# Patient Record
Sex: Female | Born: 1955 | Race: White | Hispanic: No | Smoking: Current every day smoker
Health system: Southern US, Community
[De-identification: ages and names within clinical notes are randomized; demographics above are authoritative.]

## PROBLEM LIST (undated history)

## (undated) ENCOUNTER — Emergency Department (HOSPITAL_COMMUNITY): Discharge status: Absent without leave | Payer: Medicare Other

## (undated) DIAGNOSIS — I10 Essential (primary) hypertension: Secondary | ICD-10-CM

## (undated) DIAGNOSIS — C3490 Malignant neoplasm of unspecified part of unspecified bronchus or lung: Secondary | ICD-10-CM

## (undated) DIAGNOSIS — M549 Dorsalgia, unspecified: Secondary | ICD-10-CM

## (undated) DIAGNOSIS — M5137 Other intervertebral disc degeneration, lumbosacral region: Secondary | ICD-10-CM

## (undated) DIAGNOSIS — R296 Repeated falls: Secondary | ICD-10-CM

## (undated) DIAGNOSIS — F329 Major depressive disorder, single episode, unspecified: Secondary | ICD-10-CM

## (undated) DIAGNOSIS — E271 Primary adrenocortical insufficiency: Secondary | ICD-10-CM

## (undated) DIAGNOSIS — K219 Gastro-esophageal reflux disease without esophagitis: Secondary | ICD-10-CM

## (undated) DIAGNOSIS — F32A Depression, unspecified: Secondary | ICD-10-CM

## (undated) DIAGNOSIS — C439 Malignant melanoma of skin, unspecified: Secondary | ICD-10-CM

## (undated) DIAGNOSIS — E063 Autoimmune thyroiditis: Secondary | ICD-10-CM

## (undated) DIAGNOSIS — E039 Hypothyroidism, unspecified: Secondary | ICD-10-CM

## (undated) DIAGNOSIS — G8929 Other chronic pain: Secondary | ICD-10-CM

## (undated) DIAGNOSIS — M48061 Spinal stenosis, lumbar region without neurogenic claudication: Secondary | ICD-10-CM

## (undated) DIAGNOSIS — E785 Hyperlipidemia, unspecified: Secondary | ICD-10-CM

## (undated) DIAGNOSIS — I6529 Occlusion and stenosis of unspecified carotid artery: Secondary | ICD-10-CM

## (undated) DIAGNOSIS — M51379 Other intervertebral disc degeneration, lumbosacral region without mention of lumbar back pain or lower extremity pain: Secondary | ICD-10-CM

## (undated) DIAGNOSIS — J449 Chronic obstructive pulmonary disease, unspecified: Secondary | ICD-10-CM

## (undated) DIAGNOSIS — Z8701 Personal history of pneumonia (recurrent): Secondary | ICD-10-CM

## (undated) DIAGNOSIS — F319 Bipolar disorder, unspecified: Secondary | ICD-10-CM

## (undated) DIAGNOSIS — K220 Achalasia of cardia: Secondary | ICD-10-CM

## (undated) DIAGNOSIS — F419 Anxiety disorder, unspecified: Secondary | ICD-10-CM

## (undated) DIAGNOSIS — E118 Type 2 diabetes mellitus with unspecified complications: Secondary | ICD-10-CM

## (undated) DIAGNOSIS — M792 Neuralgia and neuritis, unspecified: Secondary | ICD-10-CM

## (undated) DIAGNOSIS — G459 Transient cerebral ischemic attack, unspecified: Secondary | ICD-10-CM

## (undated) HISTORY — PX: BREAST SURGERY: SHX581

## (undated) HISTORY — PX: HIATAL HERNIA REPAIR: SHX195

## (undated) HISTORY — PX: STOMACH SURGERY: SHX791

## (undated) HISTORY — PX: BACK SURGERY: SHX140

## (undated) HISTORY — DX: Autoimmune thyroiditis: E06.3

## (undated) HISTORY — DX: Chronic obstructive pulmonary disease, unspecified: J44.9

## (undated) HISTORY — DX: Depression, unspecified: F32.A

## (undated) HISTORY — DX: Other intervertebral disc degeneration, lumbosacral region: M51.37

## (undated) HISTORY — PX: TONSILLECTOMY: SUR1361

## (undated) HISTORY — DX: Malignant melanoma of skin, unspecified: C43.9

## (undated) HISTORY — DX: Other intervertebral disc degeneration, lumbosacral region without mention of lumbar back pain or lower extremity pain: M51.379

## (undated) HISTORY — DX: Hyperlipidemia, unspecified: E78.5

## (undated) HISTORY — DX: Dorsalgia, unspecified: M54.9

## (undated) HISTORY — DX: Anxiety disorder, unspecified: F41.9

## (undated) HISTORY — PX: ECTOPIC PREGNANCY SURGERY: SHX613

## (undated) HISTORY — DX: Bipolar disorder, unspecified: F31.9

## (undated) HISTORY — DX: Major depressive disorder, single episode, unspecified: F32.9

## (undated) HISTORY — PX: APPENDECTOMY: SHX54

## (undated) HISTORY — PX: INGUINAL HERNIA REPAIR: SUR1180

## (undated) HISTORY — PX: OTHER SURGICAL HISTORY: SHX169

## (undated) HISTORY — PX: CHOLECYSTECTOMY: SHX55

## (undated) HISTORY — DX: Other chronic pain: G89.29

## (undated) HISTORY — PX: EYE SURGERY: SHX253

## (undated) HISTORY — PX: ABDOMINAL HYSTERECTOMY: SHX81

## (undated) HISTORY — DX: Spinal stenosis, lumbar region without neurogenic claudication: M48.061

## (undated) HISTORY — DX: Type 2 diabetes mellitus with unspecified complications: E11.8

---

## 1997-06-03 ENCOUNTER — Encounter: Admission: RE | Admit: 1997-06-03 | Discharge: 1997-09-01 | Payer: Self-pay | Admitting: Neurological Surgery

## 1997-06-23 ENCOUNTER — Ambulatory Visit (HOSPITAL_COMMUNITY): Admission: RE | Admit: 1997-06-23 | Discharge: 1997-06-23 | Payer: Self-pay | Admitting: Neurological Surgery

## 1998-04-15 ENCOUNTER — Ambulatory Visit (HOSPITAL_COMMUNITY): Admission: RE | Admit: 1998-04-15 | Discharge: 1998-04-15 | Payer: Self-pay | Admitting: Neurological Surgery

## 1998-04-15 ENCOUNTER — Encounter: Payer: Self-pay | Admitting: Neurological Surgery

## 1998-08-31 ENCOUNTER — Encounter: Payer: Self-pay | Admitting: Neurological Surgery

## 1998-08-31 ENCOUNTER — Ambulatory Visit (HOSPITAL_COMMUNITY): Admission: RE | Admit: 1998-08-31 | Discharge: 1998-08-31 | Payer: Self-pay | Admitting: Neurological Surgery

## 1999-07-08 ENCOUNTER — Emergency Department (HOSPITAL_COMMUNITY): Admission: EM | Admit: 1999-07-08 | Discharge: 1999-07-08 | Payer: Self-pay | Admitting: Emergency Medicine

## 1999-07-08 ENCOUNTER — Encounter: Payer: Self-pay | Admitting: Emergency Medicine

## 2000-09-25 ENCOUNTER — Ambulatory Visit (HOSPITAL_COMMUNITY): Admission: RE | Admit: 2000-09-25 | Discharge: 2000-09-25 | Payer: Self-pay | Admitting: Internal Medicine

## 2000-09-25 ENCOUNTER — Encounter: Payer: Self-pay | Admitting: Internal Medicine

## 2001-03-27 ENCOUNTER — Encounter (INDEPENDENT_AMBULATORY_CARE_PROVIDER_SITE_OTHER): Payer: Self-pay | Admitting: Internal Medicine

## 2001-03-27 ENCOUNTER — Ambulatory Visit (HOSPITAL_COMMUNITY): Admission: RE | Admit: 2001-03-27 | Discharge: 2001-03-27 | Payer: Self-pay | Admitting: Internal Medicine

## 2001-04-01 ENCOUNTER — Ambulatory Visit (HOSPITAL_COMMUNITY): Admission: RE | Admit: 2001-04-01 | Discharge: 2001-04-01 | Payer: Self-pay | Admitting: Internal Medicine

## 2001-04-01 ENCOUNTER — Encounter (INDEPENDENT_AMBULATORY_CARE_PROVIDER_SITE_OTHER): Payer: Self-pay | Admitting: Internal Medicine

## 2002-03-11 ENCOUNTER — Ambulatory Visit (HOSPITAL_COMMUNITY): Admission: RE | Admit: 2002-03-11 | Discharge: 2002-03-11 | Payer: Self-pay | Admitting: Internal Medicine

## 2002-06-20 ENCOUNTER — Emergency Department (HOSPITAL_COMMUNITY): Admission: EM | Admit: 2002-06-20 | Discharge: 2002-06-20 | Payer: Self-pay

## 2002-10-04 ENCOUNTER — Emergency Department (HOSPITAL_COMMUNITY): Admission: EM | Admit: 2002-10-04 | Discharge: 2002-10-04 | Payer: Self-pay | Admitting: Emergency Medicine

## 2002-10-04 ENCOUNTER — Encounter: Payer: Self-pay | Admitting: Emergency Medicine

## 2002-10-12 ENCOUNTER — Ambulatory Visit (HOSPITAL_COMMUNITY): Admission: RE | Admit: 2002-10-12 | Discharge: 2002-10-12 | Payer: Self-pay | Admitting: *Deleted

## 2002-10-12 ENCOUNTER — Other Ambulatory Visit: Admission: RE | Admit: 2002-10-12 | Discharge: 2002-10-12 | Payer: Self-pay | Admitting: *Deleted

## 2002-10-12 ENCOUNTER — Encounter: Payer: Self-pay | Admitting: *Deleted

## 2003-02-12 ENCOUNTER — Emergency Department (HOSPITAL_COMMUNITY): Admission: EM | Admit: 2003-02-12 | Discharge: 2003-02-12 | Payer: Self-pay | Admitting: Emergency Medicine

## 2003-03-03 ENCOUNTER — Other Ambulatory Visit: Payer: Self-pay

## 2003-10-09 ENCOUNTER — Emergency Department (HOSPITAL_COMMUNITY): Admission: EM | Admit: 2003-10-09 | Discharge: 2003-10-09 | Payer: Self-pay | Admitting: Emergency Medicine

## 2004-07-07 ENCOUNTER — Encounter: Admission: RE | Admit: 2004-07-07 | Discharge: 2004-07-07 | Payer: Self-pay | Admitting: Obstetrics & Gynecology

## 2004-08-10 ENCOUNTER — Ambulatory Visit (HOSPITAL_COMMUNITY): Admission: RE | Admit: 2004-08-10 | Discharge: 2004-08-10 | Payer: Self-pay | Admitting: Obstetrics and Gynecology

## 2004-12-15 ENCOUNTER — Emergency Department (HOSPITAL_COMMUNITY): Admission: EM | Admit: 2004-12-15 | Discharge: 2004-12-15 | Payer: Self-pay | Admitting: Emergency Medicine

## 2005-03-04 ENCOUNTER — Emergency Department (HOSPITAL_COMMUNITY): Admission: EM | Admit: 2005-03-04 | Discharge: 2005-03-04 | Payer: Self-pay | Admitting: Emergency Medicine

## 2005-03-21 ENCOUNTER — Ambulatory Visit: Payer: Self-pay | Admitting: Orthopedic Surgery

## 2005-03-23 ENCOUNTER — Ambulatory Visit (HOSPITAL_COMMUNITY): Admission: RE | Admit: 2005-03-23 | Discharge: 2005-03-23 | Payer: Self-pay | Admitting: Orthopedic Surgery

## 2005-04-16 ENCOUNTER — Ambulatory Visit: Payer: Self-pay | Admitting: Orthopedic Surgery

## 2007-06-04 ENCOUNTER — Ambulatory Visit (HOSPITAL_COMMUNITY): Payer: Self-pay | Admitting: Psychiatry

## 2007-07-02 ENCOUNTER — Ambulatory Visit (HOSPITAL_COMMUNITY): Payer: Self-pay | Admitting: Psychiatry

## 2007-08-27 ENCOUNTER — Ambulatory Visit: Payer: Self-pay | Admitting: *Deleted

## 2007-08-27 ENCOUNTER — Inpatient Hospital Stay (HOSPITAL_COMMUNITY): Admission: RE | Admit: 2007-08-27 | Discharge: 2007-08-29 | Payer: Self-pay | Admitting: *Deleted

## 2007-08-27 ENCOUNTER — Encounter: Payer: Self-pay | Admitting: Emergency Medicine

## 2007-08-27 ENCOUNTER — Ambulatory Visit (HOSPITAL_COMMUNITY): Payer: Self-pay | Admitting: Psychiatry

## 2007-09-03 ENCOUNTER — Ambulatory Visit (HOSPITAL_COMMUNITY): Payer: Self-pay | Admitting: Psychiatry

## 2007-09-15 ENCOUNTER — Ambulatory Visit (HOSPITAL_COMMUNITY): Payer: Self-pay | Admitting: Psychiatry

## 2007-10-24 ENCOUNTER — Ambulatory Visit (HOSPITAL_COMMUNITY): Payer: Self-pay | Admitting: Psychiatry

## 2007-10-30 ENCOUNTER — Ambulatory Visit (HOSPITAL_COMMUNITY): Payer: Self-pay | Admitting: Licensed Clinical Social Worker

## 2007-11-06 ENCOUNTER — Ambulatory Visit (HOSPITAL_COMMUNITY): Payer: Self-pay | Admitting: Licensed Clinical Social Worker

## 2007-11-13 ENCOUNTER — Ambulatory Visit (HOSPITAL_COMMUNITY): Payer: Self-pay | Admitting: Licensed Clinical Social Worker

## 2008-01-07 ENCOUNTER — Ambulatory Visit (HOSPITAL_COMMUNITY): Payer: Self-pay | Admitting: Licensed Clinical Social Worker

## 2008-02-04 ENCOUNTER — Ambulatory Visit (HOSPITAL_COMMUNITY): Payer: Self-pay | Admitting: Psychiatry

## 2008-02-20 ENCOUNTER — Emergency Department (HOSPITAL_COMMUNITY): Admission: EM | Admit: 2008-02-20 | Discharge: 2008-02-20 | Payer: Self-pay | Admitting: Emergency Medicine

## 2008-02-23 ENCOUNTER — Ambulatory Visit (HOSPITAL_COMMUNITY): Admission: RE | Admit: 2008-02-23 | Discharge: 2008-02-23 | Payer: Self-pay | Admitting: Emergency Medicine

## 2008-03-05 ENCOUNTER — Ambulatory Visit (HOSPITAL_COMMUNITY): Payer: Self-pay | Admitting: Psychiatry

## 2008-03-17 ENCOUNTER — Ambulatory Visit (HOSPITAL_COMMUNITY): Payer: Self-pay | Admitting: Licensed Clinical Social Worker

## 2008-03-31 ENCOUNTER — Ambulatory Visit (HOSPITAL_COMMUNITY): Payer: Self-pay | Admitting: Psychiatry

## 2008-04-06 ENCOUNTER — Inpatient Hospital Stay (HOSPITAL_COMMUNITY): Admission: RE | Admit: 2008-04-06 | Discharge: 2008-04-09 | Payer: Self-pay | Admitting: Neurological Surgery

## 2009-03-02 ENCOUNTER — Emergency Department (HOSPITAL_COMMUNITY): Admission: EM | Admit: 2009-03-02 | Discharge: 2009-03-02 | Payer: Self-pay | Admitting: Emergency Medicine

## 2009-03-05 ENCOUNTER — Emergency Department (HOSPITAL_COMMUNITY): Admission: EM | Admit: 2009-03-05 | Discharge: 2009-03-05 | Payer: Self-pay | Admitting: Emergency Medicine

## 2009-03-11 ENCOUNTER — Emergency Department (HOSPITAL_COMMUNITY): Admission: EM | Admit: 2009-03-11 | Discharge: 2009-03-11 | Payer: Self-pay | Admitting: Emergency Medicine

## 2009-03-23 ENCOUNTER — Ambulatory Visit (HOSPITAL_COMMUNITY): Payer: Self-pay | Admitting: Psychiatry

## 2009-03-30 ENCOUNTER — Ambulatory Visit (HOSPITAL_COMMUNITY): Payer: Self-pay | Admitting: Psychiatry

## 2009-04-13 ENCOUNTER — Ambulatory Visit (HOSPITAL_COMMUNITY): Payer: Self-pay | Admitting: Psychiatry

## 2009-04-26 ENCOUNTER — Emergency Department (HOSPITAL_COMMUNITY): Admission: EM | Admit: 2009-04-26 | Discharge: 2009-04-26 | Payer: Self-pay | Admitting: Emergency Medicine

## 2009-05-04 ENCOUNTER — Ambulatory Visit (HOSPITAL_COMMUNITY): Payer: Self-pay | Admitting: Psychiatry

## 2009-05-13 ENCOUNTER — Emergency Department (HOSPITAL_COMMUNITY): Admission: EM | Admit: 2009-05-13 | Discharge: 2009-05-13 | Payer: Self-pay | Admitting: Emergency Medicine

## 2009-05-20 ENCOUNTER — Ambulatory Visit (HOSPITAL_COMMUNITY): Admission: RE | Admit: 2009-05-20 | Discharge: 2009-05-20 | Payer: Self-pay | Admitting: Family Medicine

## 2009-05-31 ENCOUNTER — Ambulatory Visit (HOSPITAL_COMMUNITY): Admission: RE | Admit: 2009-05-31 | Discharge: 2009-05-31 | Payer: Self-pay | Admitting: Family Medicine

## 2009-06-14 ENCOUNTER — Ambulatory Visit (HOSPITAL_COMMUNITY): Payer: Self-pay | Admitting: Psychiatry

## 2009-06-16 ENCOUNTER — Ambulatory Visit (HOSPITAL_COMMUNITY): Payer: Self-pay | Admitting: Psychiatry

## 2009-06-20 ENCOUNTER — Encounter: Admission: RE | Admit: 2009-06-20 | Discharge: 2009-06-20 | Payer: Self-pay | Admitting: Neurological Surgery

## 2009-06-21 ENCOUNTER — Ambulatory Visit (HOSPITAL_COMMUNITY): Payer: Self-pay | Admitting: Psychiatry

## 2009-07-12 ENCOUNTER — Ambulatory Visit (HOSPITAL_COMMUNITY): Payer: Self-pay | Admitting: Psychiatry

## 2009-07-21 ENCOUNTER — Ambulatory Visit (HOSPITAL_COMMUNITY): Payer: Self-pay | Admitting: Psychiatry

## 2009-07-28 ENCOUNTER — Emergency Department (HOSPITAL_COMMUNITY): Admission: EM | Admit: 2009-07-28 | Discharge: 2009-07-28 | Payer: Self-pay | Admitting: Emergency Medicine

## 2009-07-29 ENCOUNTER — Emergency Department (HOSPITAL_COMMUNITY): Admission: EM | Admit: 2009-07-29 | Discharge: 2009-07-30 | Payer: Self-pay | Admitting: Emergency Medicine

## 2009-08-02 ENCOUNTER — Ambulatory Visit (HOSPITAL_COMMUNITY): Payer: Self-pay | Admitting: Psychiatry

## 2009-08-09 ENCOUNTER — Ambulatory Visit (HOSPITAL_COMMUNITY): Payer: Self-pay | Admitting: Psychiatry

## 2009-08-19 ENCOUNTER — Ambulatory Visit (HOSPITAL_COMMUNITY): Payer: Self-pay | Admitting: Psychiatry

## 2009-09-30 ENCOUNTER — Ambulatory Visit (HOSPITAL_COMMUNITY): Payer: Self-pay | Admitting: Psychiatry

## 2009-10-06 ENCOUNTER — Ambulatory Visit (HOSPITAL_COMMUNITY): Payer: Self-pay | Admitting: Psychiatry

## 2009-10-13 ENCOUNTER — Ambulatory Visit (HOSPITAL_COMMUNITY): Payer: Self-pay | Admitting: Psychiatry

## 2009-10-20 ENCOUNTER — Ambulatory Visit (HOSPITAL_COMMUNITY): Payer: Self-pay | Admitting: Psychiatry

## 2009-10-27 ENCOUNTER — Emergency Department (HOSPITAL_COMMUNITY): Admission: EM | Admit: 2009-10-27 | Discharge: 2009-10-27 | Payer: Self-pay | Admitting: Emergency Medicine

## 2009-12-08 ENCOUNTER — Emergency Department (HOSPITAL_COMMUNITY): Admission: EM | Admit: 2009-12-08 | Discharge: 2009-03-06 | Payer: Self-pay | Admitting: Emergency Medicine

## 2009-12-13 ENCOUNTER — Ambulatory Visit (HOSPITAL_COMMUNITY): Payer: Self-pay | Admitting: Psychiatry

## 2009-12-22 ENCOUNTER — Ambulatory Visit (HOSPITAL_COMMUNITY): Payer: Self-pay | Admitting: Psychiatry

## 2010-01-03 ENCOUNTER — Ambulatory Visit (HOSPITAL_COMMUNITY)
Admission: RE | Admit: 2010-01-03 | Discharge: 2010-01-03 | Payer: Self-pay | Source: Home / Self Care | Attending: Psychiatry | Admitting: Psychiatry

## 2010-01-10 ENCOUNTER — Ambulatory Visit (HOSPITAL_COMMUNITY)
Admission: RE | Admit: 2010-01-10 | Discharge: 2010-01-10 | Payer: Self-pay | Source: Home / Self Care | Attending: Psychiatry | Admitting: Psychiatry

## 2010-01-13 ENCOUNTER — Ambulatory Visit (HOSPITAL_COMMUNITY)
Admission: RE | Admit: 2010-01-13 | Discharge: 2010-01-13 | Payer: Self-pay | Source: Home / Self Care | Attending: Psychiatry | Admitting: Psychiatry

## 2010-01-16 ENCOUNTER — Ambulatory Visit (HOSPITAL_COMMUNITY)
Admission: RE | Admit: 2010-01-16 | Discharge: 2010-01-16 | Payer: Self-pay | Source: Home / Self Care | Attending: Psychiatry | Admitting: Psychiatry

## 2010-01-21 ENCOUNTER — Encounter: Payer: Self-pay | Admitting: *Deleted

## 2010-01-22 ENCOUNTER — Encounter: Payer: Self-pay | Admitting: Obstetrics & Gynecology

## 2010-01-22 ENCOUNTER — Encounter: Payer: Self-pay | Admitting: Neurology

## 2010-01-23 ENCOUNTER — Encounter: Payer: Self-pay | Admitting: Neurology

## 2010-01-24 ENCOUNTER — Ambulatory Visit (HOSPITAL_COMMUNITY)
Admission: RE | Admit: 2010-01-24 | Discharge: 2010-01-24 | Payer: Self-pay | Source: Home / Self Care | Attending: Psychiatry | Admitting: Psychiatry

## 2010-01-25 ENCOUNTER — Inpatient Hospital Stay (HOSPITAL_COMMUNITY)
Admission: RE | Admit: 2010-01-25 | Discharge: 2010-01-28 | Payer: Self-pay | Source: Home / Self Care | Attending: Psychiatry | Admitting: Psychiatry

## 2010-01-26 LAB — COMPREHENSIVE METABOLIC PANEL
ALT: 18 U/L (ref 0–35)
Alkaline Phosphatase: 51 U/L (ref 39–117)
CO2: 32 mEq/L (ref 19–32)
Chloride: 100 mEq/L (ref 96–112)
GFR calc non Af Amer: 58 mL/min — ABNORMAL LOW (ref >60)
Glucose, Bld: 91 mg/dL (ref 70–99)
Potassium: 4.4 mEq/L (ref 3.5–5.1)
Sodium: 140 mEq/L (ref 135–145)
Total Bilirubin: 0.5 mg/dL (ref 0.3–1.2)

## 2010-01-26 LAB — CBC
HCT: 43 % (ref 36.0–46.0)
Hemoglobin: 14.3 g/dL (ref 12.0–15.0)
MCH: 31.8 pg (ref 26.0–34.0)
MCHC: 33.3 g/dL (ref 30.0–36.0)
RBC: 4.49 MIL/uL (ref 3.87–5.11)

## 2010-01-26 LAB — TSH: TSH: 2.119 u[IU]/mL (ref 0.350–4.500)

## 2010-01-31 NOTE — H&P (Signed)
NAMEMarland Kitchen  Tonya Sullivan, Tonya Sullivan NO.:  [id number]  MEDICAL RECORD NO.:  [id number]          PATIENT TYPE:  IPS  LOCATION:  0506                          FACILITY:  [facility]  PHYSICIAN:  Marlis Edelson, DO        DATE OF BIRTH:  1955/11/18  DATE OF ADMISSION:  01/25/2010 DATE OF DISCHARGE:                      PSYCHIATRIC ADMISSION ASSESSMENT   This is on a 55 year old female voluntarily admitted on January 25, 2010.  HISTORY OF PRESENT ILLNESS:  Patient reports that she felt that she has been "pushed to the edge", was hoping to go to the IOP program but felt that she was unsafe, having suicidal thoughts, no specific plan.  She reports distress with her son and unable to see her grandchild.  She states her daughter-in-law does not like her, has not seen her son for over 3 years.  She reports also another significant stressor is that she may be getting an eviction notice as patient stays in housing and they had found out that her husband that she separated from was there helping her during a procedure.  She reports not sleeping well, has had an increase in her appetite gaining over 20 pounds.  She has a history of chronic medical issues and is due to have some back surgery soon. Denies any hallucinations.  Denies any substance use.  PAST PSYCHIATRIC HISTORY:  First admission to [facility]. She sees Dr. Lolly Mustache and Florencia Reasons for outpatient mental health services.  SOCIAL HISTORY:  Patient lives in [place].  She is separated.  She has a 44 year old son and a 47-year-old grandchild.  FAMILY HISTORY:  None.  ALCOHOL AND DRUG HISTORY:  Denies.  PRIMARY CARE PROVIDER:  Dr. Florene Glen.  MEDICAL PROBLEMS: 1. A history of hyperlipidemia. 2. Back pain.  MEDICATIONS: 1. Gabapentin 800 mg t.i.d. 2. Citalopram 40 mg 1-1/2 daily. 3. Pravastatin 40 mg daily. 4. Aspirin over the counter 81 mg daily. 5. Risperdal 2 mg q.h.s. 6. Trazodone 150 mg q.h.s. 7.  Combivent inhaler as needed. 8. OxyContin 15 mg 1 tab b.i.d.  DRUG ALLERGIES: 1. LEVAQUIN with a side effect of whelps. 2. IODINE with throat swelling. 3. CODEINE, does not act normal. 4. FLEXERIL, side effect of hives. 5. CEPHALOSPORINS, swelling.  PHYSICAL EXAM:  This is a middle-aged female, normally developed, appears in no acute distress.  Her temperature is 97.2, 79 heart rate, 18 respirations, blood pressure is 125/77.  Her review of systems is significant for insomnia and increased appetite of 20 pounds.  Positive for depression.  \ GENERAL APPEARANCE:  Again, a middle-aged female in no acute distress. HEAD:  Atraumatic. NECK:  Negative lymphadenopathy. CHEST:  Clear. BREAST:  Exam is deferred. HEART:  Regular rate and rhythm. ABDOMEN:  Soft, nontender, nondistended abdomen. PELVIC AND GU:  Exams deferred. EXTREMITIES:  Moves all extremities.  No swelling.  No deformities. SKIN:  Warm and dry. NEUROLOGICAL:  Findings are intact and nonfocal.  LABORATORY DATA:  Shows a TSH of 2.119.  CMET within normal limits.  CBC is within normal limits.  MENTAL STATUS EXAM:  Patient is resting in bed.  She cooperates during  the interview.  She seems sleepy, casually dressed, fair eye contact. Provides a history of her circumstances that surrounded this admission. Thought processes are coherent, goal directed.  Cognitive function intact.  Her memory appears intact.  Judgment and insight appear to be good.  AXIS I:  Major depressive disorder. AXIS II:  Deferred. AXIS III: 1. A history of back pain. 2. Hyperlipidemia. 3. Asthma. AXIS IV:  Problems with her son, other psychosocial problems, medical problems, possible problems related to housing with a possibility of being evicted. AXIS V:  Current is 35.  PLAN:  Continue her medications.  We will clarify her pain medications per the pain clinic.  We will continue with her Celexa at 40 mg daily and add Risperdal 1 mg q.h.s. and  trazodone 150 mg for sleep.  We will offer a family session with her support group.  Patient would benefit from her continued counseling and compliance with her medications.  Her tentative length of stay at this time is 3 to 5 days.     Landry Corporal, N.P.   ______________________________ Marlis Edelson, DO    JO/MEDQ  D:  01/26/2010  T:  01/26/2010  Job:  [id number]  Electronically Signed by Limmie PatriciaP. on 01/30/2010 03:44:46 PM Electronically Signed by Marlis Edelson MD on 01/31/2010 07:53:33 PM

## 2010-02-02 ENCOUNTER — Encounter (INDEPENDENT_AMBULATORY_CARE_PROVIDER_SITE_OTHER): Payer: Self-pay | Admitting: Psychiatry

## 2010-02-02 DIAGNOSIS — F319 Bipolar disorder, unspecified: Secondary | ICD-10-CM

## 2010-02-03 ENCOUNTER — Encounter (HOSPITAL_COMMUNITY): Payer: Self-pay | Admitting: Psychiatry

## 2010-02-06 NOTE — Discharge Summary (Signed)
NAME:  Tonya Sullivan NO.:  [id number]  MEDICAL RECORD NO.:  [id number]          PATIENT TYPE:  IPS  LOCATION:  0506                          FACILITY:  [facility]  PHYSICIAN:  Marlis Edelson, DO        DATE OF BIRTH:  [birth year]  DATE OF ADMISSION:  01/25/2010 DATE OF DISCHARGE:  01/28/2010                              DISCHARGE SUMMARY   CHIEF COMPLAINT:  "Pushed to the edge."  HISTORY OF CHIEF COMPLAINT:  Tonya Sullivan is a 55 year old Caucasian female admitted to [facility] stating that she had been pushed to the edge and was hoping to go to the IOP program, but felt that she was unsafe, having suicidal thoughts with no specific plan. She reports distress with her son and frustration over her inability to see her grandchild.  She stated that her daughter-in-law does not like her and she has not seen her son for over 3 years.  She reported also another significant stressor in that she may be getting an eviction notice from her housing and they found out that her husband and she had separated, but he had been there helping her.  She reports not sleeping well.  She had an increase in appetite, gaining over 20 pounds.  She has a history of chronic medical issues and is due to have back surgery soon.  She denied any psychotic symptoms.  She denied any history of substance abuse.  This was her first admission to [facility]  She had been followed by Dr. Lolly Mustache and Florencia Reasons [facility].  She has medical problems that include back pain and hyperlipidemia.  MEDICATIONS AT TIME OF ADMISSION: 1. Gabapentin 800 mg three times a day. 2. Citalopram 60 mg daily. 3. Pravastatin 40 mg daily. 4. Aspirin 81 mg daily. 5. Risperdal 2 mg at bedtime. 6. Trazodone 150 mg at bedtime. 7. Combivent inhaler as needed. 8. OxyContin 15 mg 1 tablet twice per day.  HOSPITAL COURSE:  Per progress notes, the patient was admitted  and integrated into the adult milieu with attendance into psychotherapy groups.  Contact was made with her husband by counselors who provided him with information regarding crisis management and suicide prevention. The patient reported that she was doing good, denying suicidal or homicidal ideation by January 27, 2010.  She did not display any hypomania or psychosis.  She was resting in bed on the morning of January 27.  She had no problems with medications.  She requested a prescription for metronidazole for vaginal problems.  She sees Dr. Emelda Fear in [place] as her OB/GYN and stated that he had started that medication a few days prior.  She had no other changes or wishes.  On January 28, her mood and affect were noted to be appropriate.  Flagyl had been started.  She was ready for discharge.  She stated she understood and agreed with the discharge plan and she called her husband for discharge.  CONSULTATIONS:  None.  COMPLICATIONS:  None.  PROCEDURES:  None.  MENTAL STATUS EXAM:  The patient was not seen by the  dictating provider, although she was admitted to my service.  I was not present during the course of hospitalization nor at discharge.  I do note, per the record, that she had no suicidal or homicidal ideation and no auditory visual hallucinations at the time of discharge.  DISCHARGE DIAGNOSES:  AXIS I:  Per records, major depressive disorder. AXIS II:  Deferred. AXIS III:  Chronic back pain, hyperlipidemia, asthma. AXIS IV:  Problems with son relationship issues and possible eviction from home. AXIS V:  At discharge unknown.  DISCHARGE INSTRUCTIONS:  Follow up at [facility] with Dr. Lolly Mustache on February 09, 2010.  Follow up with Florencia Reasons, therapist on February 03, 2010 at 2:45 p.m.  DISCHARGE MEDICATIONS: 1. Metronidazole 500 mg by mouth twice daily. 2. Risperdal 1 mg daily at bedtime. 3. Enteric-coated aspirin 81 mg daily. 4. Citalopram 60 mg  daily. 5. Clonazepam 1/2 mg every 6 hours as needed. 6. Combivent inhaler 2 puffs every 4 hours as needed. 7. Fish oil 1000 mg daily. 8. Gabapentin 1 tablet three times a day. 9. OxyContin 15 mg 1 tablet twice daily.  PROGNOSIS:  Undetermined by this provider.          ______________________________ Marlis Edelson, DO     DB/MEDQ  D:  02/05/2010  T:  02/05/2010  Job:  [id number]  Electronically Signed by Marlis Edelson MD on 02/06/2010 07:50:04 PM

## 2010-02-09 ENCOUNTER — Encounter (INDEPENDENT_AMBULATORY_CARE_PROVIDER_SITE_OTHER): Payer: Medicare Other | Admitting: Psychiatry

## 2010-02-09 ENCOUNTER — Encounter (HOSPITAL_COMMUNITY): Payer: Self-pay | Admitting: Psychiatry

## 2010-02-09 DIAGNOSIS — F3130 Bipolar disorder, current episode depressed, mild or moderate severity, unspecified: Secondary | ICD-10-CM

## 2010-02-10 ENCOUNTER — Encounter (INDEPENDENT_AMBULATORY_CARE_PROVIDER_SITE_OTHER): Payer: Medicare Other | Admitting: Psychiatry

## 2010-02-10 DIAGNOSIS — F319 Bipolar disorder, unspecified: Secondary | ICD-10-CM

## 2010-02-24 ENCOUNTER — Encounter (HOSPITAL_COMMUNITY): Payer: Medicare Other | Admitting: Psychiatry

## 2010-03-02 ENCOUNTER — Encounter (HOSPITAL_COMMUNITY): Payer: Medicare Other | Admitting: Psychiatry

## 2010-03-10 ENCOUNTER — Encounter (HOSPITAL_COMMUNITY): Payer: Medicare Other | Admitting: Psychiatry

## 2010-03-12 ENCOUNTER — Emergency Department (HOSPITAL_COMMUNITY): Payer: Medicare Other

## 2010-03-12 ENCOUNTER — Emergency Department (HOSPITAL_COMMUNITY)
Admission: EM | Admit: 2010-03-12 | Discharge: 2010-03-12 | Disposition: A | Discharge status: Home / Self Care | Payer: Medicare Other | Attending: Emergency Medicine | Admitting: Emergency Medicine

## 2010-03-12 DIAGNOSIS — Z79899 Other long term (current) drug therapy: Secondary | ICD-10-CM | POA: Insufficient documentation

## 2010-03-12 DIAGNOSIS — R079 Chest pain, unspecified: Secondary | ICD-10-CM | POA: Insufficient documentation

## 2010-03-12 DIAGNOSIS — K219 Gastro-esophageal reflux disease without esophagitis: Secondary | ICD-10-CM | POA: Insufficient documentation

## 2010-03-12 DIAGNOSIS — R0602 Shortness of breath: Secondary | ICD-10-CM | POA: Insufficient documentation

## 2010-03-12 LAB — CBC
MCHC: 33.3 g/dL (ref 30.0–36.0)
Platelets: 230 10*3/uL (ref 150–400)
RDW: 13.2 % (ref 11.5–15.5)
WBC: 7.3 10*3/uL (ref 4.0–10.5)

## 2010-03-12 LAB — DIFFERENTIAL
Basophils Absolute: 0 10*3/uL (ref 0.0–0.1)
Eosinophils Absolute: 0.2 10*3/uL (ref 0.0–0.7)
Eosinophils Relative: 2 % (ref 0–5)

## 2010-03-12 LAB — POCT CARDIAC MARKERS
CKMB, poc: 1.1 ng/mL (ref 1.0–8.0)
CKMB, poc: <1 ng/mL — ABNORMAL LOW (ref 1.0–8.0)
Troponin i, poc: <0.05 ng/mL (ref 0.00–0.09)

## 2010-03-12 LAB — BASIC METABOLIC PANEL
BUN: 10 mg/dL (ref 6–23)
Calcium: 9.3 mg/dL (ref 8.4–10.5)
GFR calc non Af Amer: 60 mL/min — ABNORMAL LOW (ref >60)
Potassium: 4.2 mEq/L (ref 3.5–5.1)
Sodium: 138 mEq/L (ref 135–145)

## 2010-03-12 MED ORDER — IOHEXOL 350 MG/ML SOLN
100.0000 mL | Freq: Once | INTRAVENOUS | Status: AC | PRN
Start: 2010-03-12 — End: 2010-03-12
  Administered 2010-03-12: 100 mL via INTRAVENOUS

## 2010-03-14 ENCOUNTER — Encounter (INDEPENDENT_AMBULATORY_CARE_PROVIDER_SITE_OTHER): Payer: Medicare Other | Admitting: Psychiatry

## 2010-03-14 DIAGNOSIS — F3189 Other bipolar disorder: Secondary | ICD-10-CM

## 2010-03-18 LAB — DIFFERENTIAL
Basophils Absolute: 0 10*3/uL (ref 0.0–0.1)
Basophils Absolute: 0 10*3/uL (ref 0.0–0.1)
Basophils Relative: 0 % (ref 0–1)
Eosinophils Absolute: 0 10*3/uL (ref 0.0–0.7)
Eosinophils Relative: 0 % (ref 0–5)
Eosinophils Relative: 1 % (ref 0–5)
Lymphocytes Relative: 33 % (ref 12–46)
Lymphocytes Relative: 35 % (ref 12–46)
Lymphs Abs: 3.1 10*3/uL (ref 0.7–4.0)
Monocytes Absolute: 0.7 10*3/uL (ref 0.1–1.0)
Monocytes Relative: 7 % (ref 3–12)
Neutro Abs: 5.5 10*3/uL (ref 1.7–7.7)
Neutrophils Relative %: 59 % (ref 43–77)

## 2010-03-18 LAB — URINALYSIS, ROUTINE W REFLEX MICROSCOPIC
Bilirubin Urine: NEGATIVE
Glucose, UA: NEGATIVE mg/dL
Hgb urine dipstick: NEGATIVE
Ketones, ur: NEGATIVE mg/dL
pH: 6.5 (ref 5.0–8.0)

## 2010-03-18 LAB — BASIC METABOLIC PANEL
BUN: 9 mg/dL (ref 6–23)
Chloride: 102 mEq/L (ref 96–112)
GFR calc Af Amer: >60 mL/min (ref >60)
GFR calc non Af Amer: >60 mL/min (ref >60)
Glucose, Bld: 103 mg/dL — ABNORMAL HIGH (ref 70–99)
Glucose, Bld: 104 mg/dL — ABNORMAL HIGH (ref 70–99)
Potassium: 3.6 mEq/L (ref 3.5–5.1)
Potassium: 4 mEq/L (ref 3.5–5.1)
Sodium: 133 mEq/L — ABNORMAL LOW (ref 135–145)

## 2010-03-18 LAB — POCT CARDIAC MARKERS

## 2010-03-18 LAB — CBC
HCT: 38.6 % (ref 36.0–46.0)
HCT: 39.5 % (ref 36.0–46.0)
Hemoglobin: 13.2 g/dL (ref 12.0–15.0)
MCHC: 34.1 g/dL (ref 30.0–36.0)
MCV: 95 fL (ref 78.0–100.0)
RBC: 4.09 MIL/uL (ref 3.87–5.11)
RDW: 13.4 % (ref 11.5–15.5)
WBC: 8.8 10*3/uL (ref 4.0–10.5)

## 2010-03-18 LAB — APTT: aPTT: 30 seconds (ref 24–37)

## 2010-03-21 ENCOUNTER — Encounter (HOSPITAL_COMMUNITY): Payer: Medicare Other | Admitting: Psychiatry

## 2010-03-21 LAB — URINALYSIS, ROUTINE W REFLEX MICROSCOPIC
Bilirubin Urine: NEGATIVE
Nitrite: NEGATIVE
Specific Gravity, Urine: 1.015 (ref 1.005–1.030)
pH: 5.5 (ref 5.0–8.0)

## 2010-03-26 LAB — URINALYSIS, ROUTINE W REFLEX MICROSCOPIC
Glucose, UA: NEGATIVE mg/dL
Hgb urine dipstick: NEGATIVE
Ketones, ur: >80 mg/dL — AB
Leukocytes, UA: NEGATIVE
Nitrite: NEGATIVE
Protein, ur: NEGATIVE mg/dL
Protein, ur: NEGATIVE mg/dL
Specific Gravity, Urine: 1.01 (ref 1.005–1.030)
Urobilinogen, UA: 0.2 mg/dL (ref 0.0–1.0)
pH: 5.5 (ref 5.0–8.0)

## 2010-03-26 LAB — RAPID URINE DRUG SCREEN, HOSP PERFORMED
Amphetamines: NOT DETECTED
Benzodiazepines: POSITIVE — AB
Cocaine: NOT DETECTED
Opiates: POSITIVE — AB
Tetrahydrocannabinol: NOT DETECTED

## 2010-03-26 LAB — DIFFERENTIAL
Basophils Absolute: 0 10*3/uL (ref 0.0–0.1)
Basophils Relative: 1 % (ref 0–1)
Eosinophils Absolute: 0.1 10*3/uL (ref 0.0–0.7)
Eosinophils Relative: 1 % (ref 0–5)
Lymphocytes Relative: 29 % (ref 12–46)

## 2010-03-26 LAB — BASIC METABOLIC PANEL
Calcium: 9.1 mg/dL (ref 8.4–10.5)
GFR calc Af Amer: >60 mL/min (ref >60)
GFR calc non Af Amer: >60 mL/min (ref >60)
Sodium: 139 mEq/L (ref 135–145)

## 2010-03-26 LAB — ETHANOL: Alcohol, Ethyl (B): <5 mg/dL (ref 0–10)

## 2010-03-26 LAB — CBC
HCT: 37.8 % (ref 36.0–46.0)
MCHC: 35 g/dL (ref 30.0–36.0)
Platelets: 209 10*3/uL (ref 150–400)
RDW: 12.9 % (ref 11.5–15.5)

## 2010-03-26 LAB — URINE CULTURE

## 2010-03-28 ENCOUNTER — Encounter (INDEPENDENT_AMBULATORY_CARE_PROVIDER_SITE_OTHER): Payer: Medicare Other | Admitting: Psychiatry

## 2010-03-28 DIAGNOSIS — F3189 Other bipolar disorder: Secondary | ICD-10-CM

## 2010-03-30 ENCOUNTER — Encounter (INDEPENDENT_AMBULATORY_CARE_PROVIDER_SITE_OTHER): Payer: Medicare Other | Admitting: Psychiatry

## 2010-03-30 DIAGNOSIS — F319 Bipolar disorder, unspecified: Secondary | ICD-10-CM

## 2010-04-05 ENCOUNTER — Encounter (INDEPENDENT_AMBULATORY_CARE_PROVIDER_SITE_OTHER): Payer: Medicare Other | Admitting: Psychiatry

## 2010-04-05 DIAGNOSIS — F319 Bipolar disorder, unspecified: Secondary | ICD-10-CM

## 2010-04-12 LAB — CBC
HCT: 31.1 % — ABNORMAL LOW (ref 36.0–46.0)
MCV: 95.8 fL (ref 78.0–100.0)
Platelets: 148 10*3/uL — ABNORMAL LOW (ref 150–400)
RBC: 3.24 MIL/uL — ABNORMAL LOW (ref 3.87–5.11)
WBC: 6.4 10*3/uL (ref 4.0–10.5)

## 2010-04-13 LAB — CBC
HCT: 43.9 % (ref 36.0–46.0)
MCHC: 34.4 g/dL (ref 30.0–36.0)
MCV: 94.9 fL (ref 78.0–100.0)
RBC: 4.63 MIL/uL (ref 3.87–5.11)
WBC: 6.1 10*3/uL (ref 4.0–10.5)

## 2010-04-13 LAB — BASIC METABOLIC PANEL
BUN: 10 mg/dL (ref 6–23)
CO2: 28 mEq/L (ref 19–32)
Chloride: 102 mEq/L (ref 96–112)
Creatinine, Ser: 0.88 mg/dL (ref 0.4–1.2)
GFR calc Af Amer: >60 mL/min (ref >60)
Potassium: 4.9 mEq/L (ref 3.5–5.1)

## 2010-04-13 LAB — ABO/RH: ABO/RH(D): A POS

## 2010-04-25 ENCOUNTER — Encounter (HOSPITAL_COMMUNITY): Payer: Medicare Other | Admitting: Psychiatry

## 2010-04-27 ENCOUNTER — Encounter (HOSPITAL_COMMUNITY): Payer: Medicare Other | Admitting: Psychiatry

## 2010-04-28 ENCOUNTER — Encounter (HOSPITAL_COMMUNITY): Payer: Medicare Other | Admitting: Psychiatry

## 2010-05-05 ENCOUNTER — Encounter (INDEPENDENT_AMBULATORY_CARE_PROVIDER_SITE_OTHER): Payer: Medicare Other | Admitting: Psychiatry

## 2010-05-05 DIAGNOSIS — F319 Bipolar disorder, unspecified: Secondary | ICD-10-CM

## 2010-05-09 ENCOUNTER — Encounter (INDEPENDENT_AMBULATORY_CARE_PROVIDER_SITE_OTHER): Payer: Medicare Other | Admitting: Psychiatry

## 2010-05-09 DIAGNOSIS — F3189 Other bipolar disorder: Secondary | ICD-10-CM

## 2010-05-11 ENCOUNTER — Encounter (INDEPENDENT_AMBULATORY_CARE_PROVIDER_SITE_OTHER): Payer: Medicare Other | Admitting: Psychiatry

## 2010-05-11 DIAGNOSIS — F319 Bipolar disorder, unspecified: Secondary | ICD-10-CM

## 2010-05-16 NOTE — H&P (Signed)
NAMEMarland Sullivan  Tonya, Sullivan NO.:  [id number]   MEDICAL RECORD NO.:  [id number]          PATIENT TYPE:  IPS   LOCATION:  0307                          FACILITY:  [facility]   PHYSICIAN:  Jasmine Pang, M.D. DATE OF BIRTH:  12-Jan-1955   DATE OF ADMISSION:  08/27/2007  DATE OF DISCHARGE:                       PSYCHIATRIC ADMISSION ASSESSMENT   This is a 55 year old female voluntarily admitted on August 27, 2007.   HISTORY OF PRESENT ILLNESS:  The patient presents with a history of  depression and suicidal thoughts.  She feels her medications are not  working.  Stating that her coping skills are low.  Stressors, the  patient endorsing problems with her marriage.  States he listens to her,  but he is not supportive.   PAST PSYCHIATRIC HISTORY:  First admission to the [facility].  She sees Dr. Lolly Mustache.  She states she has been diagnosed with  depression since the age of 55 years old.   SOCIAL HISTORY:  A 55 year old female who has a 47 year old son that  suffers from a head injury from a motor vehicle accident.  The patient  has been married for 30 years and is on disability.   FAMILY HISTORY:  Reports an alcoholic family.   ALCOHOL/DRUG HISTORY:  Denies any alcohol or drug use.   PRIMARY CARE Roselin Wiemann:  Kofi A. Gerilyn Pilgrim, M.D. and goes to [facility].   MEDICAL PROBLEMS:  1. Reports a ruptured disk at C4-C5 and has had 3 back surgeries.  2. History of COPD.   MEDICATIONS:  1. Valium 5 mg t.i.d.  2. Percocet 10/25 one b.i.d. p.r.n.  3. Effexor XR 75 mg.  The patient reports she has been tapering her      Effexor and is down to 37.5 mg.  4. Lamictal 150 mg daily.  5. Neurontin 600 b.i.d.  6. Trazodone 150 mg at bedtime.  7. Phenergan 25 mg q.6 h. p.r.n.  8. Flagyl 500 mg b.i.d.  9. Robaxin is also listed 750 mg t.i.d. p.r.n.   ALLERGIES:  1. LEVAQUIN.  2. IODINE.  3. CODEINE.   PHYSICAL EXAMINATION:  GENERAL:  This is a  middle-aged female who when  she first arrived in the unit reported chest pain and was transported  the emergency department.  She was assessed at [facility]  Department and received no medications.  Felt she was having chest wall  pain.  VITAL SIGNS:  Her temperature is 97.9, 66 heart rate, 60 respirations,  blood pressure is 122/77, 5 feet 7 inches tall, 168 pounds.   LABORATORY DATA:  BMET within normal limits.  Potassium at 5.3.  CBC  within normal limits.   DIAGNOSTICS:  Chest x-ray:  No acute findings.   MENTAL STATUS EXAM:  This is a fully alert, unkempt female,  appropriately dressed, fair eye contact.  Her speech is sometimes  difficult to understand.  Sounds slurred at times.  She is soft spoken.  Mood is depressed.  The patient's affect is flat and constricted.  No  evidence of any delusions or obsessions.  No flight of  ideas.  Her  judgment and insight are fair.  Alert and oriented x3.   AXIS I:  Mood disorder, posttraumatic stress disorder, rule out major  depressive disorder, severe.  AXIS II:  Deferred.  AXIS III:  Ruptured disk C4-C5, back pain and chronic obstructive  pulmonary disease.  AXIS IV:  Problems with primary support group, other psychosocial  problems.  Medical problems.  AXIS V:  Current is 30.   PLAN:  Contract for safety.  Stabilize mood and thinking.  We will  discontinue the Effexor as the patient feels like her medications are no  longer effective.  We will initiate Celexa with risk and benefits  discussed.  We will also clarify the patient's medications.  We will  offer a family session with her husband.  The patient may benefit from  some individual therapy  as she reports poor social support.  The  patient will follow up with Dr. Lolly Mustache after discharge.  Tentative  length of stay is 3-5 days.      Landry Corporal, N.P.      Jasmine Pang, M.D.  Electronically Signed    JO/MEDQ  D:  08/28/2007  T:  08/28/2007  Job:   [id number]

## 2010-05-16 NOTE — Op Note (Signed)
NAMEMarland Kitchen  MARSELLA, Sullivan NO.:  [id number]   MEDICAL RECORD NO.:  [id number]          PATIENT TYPE:  INP   LOCATION:  3007                         FACILITY:  [facility]   PHYSICIAN:  Stefani Dama, M.D.  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   SURGEON:  Stefani Dama, MD   ANESTHESIA:  General endotracheal.   FIRST ASSISTANT:  Hilda Lias, MD   INDICATIONS:  Tonya Sullivan is a 55 year old individual who some 12 years  ago had an degenerative process in the disk at L4-L5.  After undergoing  a single-level decompression arthrodesis, she required subsequent  stabilization.  She had surgery via an anterior and the posterior root  ultimately resulting in a 360 fusion from L4 to the sacrum.  She had  done well in the intervening 12 years but in the last 6 months to a  year, she has developed increasing problems with back pain, bilateral  lower extremity pain.  Indeed, she suffered with chronic pain but she  notes now that it has become intolerable and she has evidence of  advanced spondylitic degeneration at the adjacent level of L3-L4.  She  has been advised regarding surgical decompression arthrodesis.   PROCEDURE IN DETAIL:  The patient was brought to the operating room  supine on the stretcher.  After smooth induction of general endotracheal  anesthesia and placement of appropriate Foley catheter and monitoring  lines, she was turned prone.  The back was prepped with alcohol and  DuraPrep and draped in a sterile fashion.  The previous incision was  opened in the midline and the dissection was carried down to the  lumbodorsal fascia.  The fascia was opened on either side of midline to  expose L3, L4, L5, and the sacrum up to the previously placed hardware.  The hardware  was a USS variety from [employer] and the cap screws were  removed first.  The rod was dislodged and then the collars were removed.  Some difficulty removing the caps off the L5 and the S1 screws but  ultimately these were able to be removed and the screws were  individually removed.  This took about a part of an hour secondary to  difficulty with removing the instrumentation.  The arthrodesis itself on  this exploration appeared to be solid from L4 to the sacrum.  L3-L4 was  then addressed and a laminectomy was created removing the inferior  margin of the spinous process in both laminar arches out to and  including the inferior facet joints of L3 on L4.  The disk space was  then exposed and this area was carefully mobilized, so that the dura and  the nerve root could be mobilized medially to allow working on the disk  space.  There was a herniated nucleus pulposus with a fragment of disk  up behind the body of L3 on the left side and  this caused compression of  the L3 nerve root.  This was resected and allowed for good decompression  of the nerve root.  Then a total diskectomy was performed both medially  and laterally to remove the posterior bulging to further decompress the  central portion of the dura.  This was then done also on the right side  where there was significant spondylitic degeneration and this allowed  for good decompression of the L4 nerve root on the right side.  Interbody spacers were then sounded and sized and it was felt that a 10-  mm spacer would ultimately fit best.  Endplates were decorticated using  a tooth curette.  Once all the disk was evacuated, 10-mm cages were  filled with Vitoss bone matrix mixed with Infuse that was first packed  into the disk space and the cages were seated, one on the left and one  on the right.  Then hemostasis was achieved in the paraspinous tissues.  Pedicle entry sites were then chosen at L3.  This was done with  fluoroscopic guidance,  6.5 x 45 mm screws were placed after drilling,  tapping, and sounding each of the holes to make sure there was no  evidence of cutout.  The screws being placed in L3-L4 holes were re-  reamed to 6.5 mm diameter and 6.5 x 45 mm screws were placed there.  The  40-mm precontoured rods were then fitted between the screw heads and  tightened and torqued into final position.  AP and lateral radiograph  identified good position of the hardware.  Posterolateral gutters which  had been previously decorticated were then packed with remainder of the  patient's autologous bone graft that was mixed with the Vitoss and  Infuse combination.  This was packed into the lateral gutter at left and  right.  Once this was accomplished, the retractors were removed.  Lumbodorsal fascia was then closed in the midline with #1 Vicryl, 2-0  Vicryl used in subcutaneous tissues, 3-0 Vicryl subcuticularly, and  surgical staples on the skin.  Blood loss for the procedure was  estimated at 500 mL.  The patient tolerated the procedure well and was  returned to the recovery room in stable condition.      Stefani Dama, M.D.  Electronically Signed     HJE/MEDQ  D:  04/06/2008  T:  04/07/2008  Job:  [id number]

## 2010-05-16 NOTE — Discharge Summary (Signed)
NAMEMarland Kitchen  Tonya Sullivan, Tonya Sullivan NO.:  [id number]   MEDICAL RECORD NO.:  [id number]          PATIENT TYPE:  IPS   LOCATION:  0307                          FACILITY:  [facility]   PHYSICIAN:  Jasmine Pang, M.D. DATE OF BIRTH:  May 01, 1955   DATE OF ADMISSION:  08/27/2007  DATE OF DISCHARGE:  08/29/2007                               DISCHARGE SUMMARY   IDENTIFICATION:  This is a 55 year old married white female who was  admitted on August 27, 2007, on a voluntary basis.   HISTORY OF PRESENT ILLNESS:  The patient presents with a history of  depression and suicidal thoughts.  She feels her medications are not  working.  She stating that her coping skills are low.  Stressors  revolve around the patient endorsing problems with her marriage.  She  feels her husband is not supportive.   PAST PSYCHIATRIC HISTORY:  This is the first admission to [facility].  She sees Dr. Lolly Mustache.  She states she was diagnosed with  depression since the age of 55 years old.  The patient sees a therapist  in [place].   FAMILY HISTORY:  She reports an alcoholic family..   ALCOHOL AND DRUG HISTORY:  She denies any alcohol or drug use.   MEDICAL PROBLEMS:  She reports a ruptured disk at C4, C5, and has had  three back surgeries.   MEDICATIONS:  1. Valium 5 mg t.i.d.  2. Percocet 10/25 mg one p.o. b.i.d. p.r.n.  3. Effexor XR 75 mg daily.  She reports she has been tapering her      Effexor down and is currently on 37.5 mg b.i.d.  4. Lamictal 150 mg daily.  5. Neurontin 600 mg b.i.d.  6. Trazodone 150 mg at bedtime.  7. Phenergan 25 mg q.6 h. p.r.n.  8. Flagyl 500 mg p.o. b.i.d.  9. Robaxin 750 mg p.o. t.i.d. p.r.n., discomfort.   ALLERGIES:  1. LEVAQUIN.  2. IODINE.  3. CODEINE.   PHYSICAL FINDINGS:  There were no acute physical or medical problems  noted.  She was fully assessed at the [facility] emergency department.   ADMISSION LABORATORIES:  BMET was within normal  limits except for a  potassium of 5.3.  CBC was within normal limits.  Chest x-ray, showed no  acute findings.   HOSPITAL COURSE:  Upon admission, the patient was restarted on her home  medications Valium 5 mg p.o. t.i.d., Percocet 5/325 mg p.o. q.i.d.  p.r.n., pain, the Effexor XR was discontinued, Lamictal 150 mg daily,  Neurontin 300 mg b.i.d., trazodone 150 mg p.o. q.h.s.  The patient also  began to have chest pain and had to be sent to the hospital for a  cardiac evaluation.  She was cleared and found to be having chest pain  probably related to anxiety.  On August 28, 2007,  the Celexa 20 mg p.o.  q. day was begun.  On August 28, 2007, Percocet was changed to 10/325 mg  one p.o. b.i.d. p.r.n. after talking with her doctor's office and  getting recommendations from them.  Initially, the  patient was friendly  and cooperative with me in sessions.  She also participated  appropriately in unit therapeutic groups and activities.  She states my  medication is not working. She began to feel more depressed with  suicidal ideation, she could not contract for safety.  She is on  Effexor, Lamictal, and trazodone.  Stressors include her son's history  of automobile accident and brain injury.  She states she wants out of  her marriage.  She feels her husband is emotionally withdrawn.  She  stated she does see a therapist in [place].  There was quick  improvement in patient's mental status.  By August 29, 2007, sleep was  good.  Appetite was improving.  Mood was less depressed, less anxious.  Affect was consistent with mood.  There was no suicidal or homicidal  ideation.  No thoughts of self-injurious behavior.  No auditory or  visual hallucinations.  No paranoia or delusions.  Thoughts were logical  and goal-directed.  Thought content, no predominant theme.  Cognitive  was grossly intact.  Insight was good.  Judgment was good.  It was felt,  the patient was safe for discharge  today.   DISCHARGE DIAGNOSES:  Axis I:  Mood disorder, not otherwise specified.  Axis II:  None.  Axis III:  Ruptures C4-C5 disk, back pain, and chronic obstructive  pulmonary disease.  Axis IV:  Moderate (problems with primary support group and other  psychosocial problems, medical problems).  Axis V:  Global assessment of functioning was 50 upon discharge.  GAF  was 30 upon admission.  GAF highest past year was 65-70.   DISCHARGE PLANS:  There was no specific activity level or dietary  restriction.   POSTHOSPITAL CARE PLANS:  The patient will see Dr. Lolly Mustache on September 03, 2007, at 10 o'clock at the [facility].  She  will also see her counselor, Salomon Fick for followup therapy.   DISCHARGE MEDICATIONS:  1. Celexa 20 mg daily.  2. Trazodone 150 mg at bedtime.  3. Valium 5 mg p.o. t.i.d.  4. Percocet as prescribed by her family physician.  5. Neurontin 300 mg p.o. twice a day.  6. Lamictal 150 mg daily.  7. Phenergan per doctor.  8. Robaxin per her doctor.      Jasmine Pang, M.D.  Electronically Signed     BHS/MEDQ  D:  08/29/2007  T:  08/29/2007  Job:  [id number]

## 2010-05-19 NOTE — H&P (Signed)
NAME:  Tonya Sullivan, Tonya Sullivan NO.:  [id number]   MEDICAL RECORD NO.:  [id number]                  PATIENT TYPE:   LOCATION:                                       FACILITY:   PHYSICIAN:  Lionel December, M.D.                 DATE OF BIRTH:  [birth year]   DATE OF ADMISSION:  DATE OF DISCHARGE:                                HISTORY & PHYSICAL   CHIEF COMPLAINT:  Rectal bleeding.   HISTORY OF PRESENT ILLNESS:  The patient is a 55 year old lady with  complicated medical history who presents today for further evaluation and  large volume hematochezia 10 days ago.  She has been seen by our practice in  March 2003 for abdominal pain.  Please see past medical history for details.  The patient presents today stating that approximately 10 days ago she went  to the bathroom and passed a large amount of bright red blood with clots.  Afterwards, she had severe lower abdominal pain.  The pain continues to be  present, although she has seen no further bleeding.  Her bowels move very  sporadically, anywhere from every four to five days to several times a day.  She saw Dr. Sherryll Burger who put her on Cipro and unfortunately she has developed  oral thrush and he has called in something for her to take, although she has  not started this yet.  Cipro has been discontinued.  She complains of nausea  associated with this episode, but no vomiting.  Denies any heartburn.  She  has difficulty swallowing usually late in the day.  She has a history of  myasthenia gravis.  Chronically, she has abdominal pain which comes and  goes.  It is particularly in the lower abdomen.   CURRENT MEDICATIONS:  1. Morphine sulfate 60 mg two b.i.d.  2. Hydroxyzine 25 mg b.i.d.  3. Neurontin 800 mg t.i.d.  4. Clonazepam 1 mg t.i.d.  5. Lorazepam 30 mg daily.  6. Remeron 30 mg daily.  7. Premarin 1.25 mg daily.  8. Trazodone 150 mg b.i.d.  9. Zyrtec 10 mg daily.  10.      Baclofen 10 mg t.i.d.  11.       Percocet 10/325 mg p.r.n.  12.      Albuterol inhaler p.r.n.   ALLERGIES:  CODEINE and LORCET.   PAST MEDICAL HISTORY:  1. Myasthenia gravis.  2. Depression.  3. Anxiety.  4. Learning disability (per patient), ADD per patient.  5. Arthritis.  6. History of severe endometriosis 1984.  7. She had an ectopic pregnancy previously requiring surgery.  8. History of gastroesophageal reflux disease status post laparoscopic     Nissen and is doing well.  9. She saw [place] last year for chronic abdominal pain.  Abdominal ultrasound     February 26, 2001 which revealed normal bile ducts, no cholelithiasis.     Pancreas  was poorly visualized.  Spleen appeared normal.  Liver appeared     normal.  She had a CT and angiogram which revealed patent celiac SMA     arteries.  IMA appeared patent, but was difficult to visualize due to     metallic artifact from the lumbar spine.  She had mildly enlarged celiac     and periportal lymph nodes which were nonspecific.  CT of the abdomen and     pelvis was done which revealed diffuse fatty infiltration of the liver,     unchanged, two upper limits of normal lymph nodes, and celiac access in     periportal region of the abdomen.  There was thinning of the anterior     abdominal wall musculature to the left of the midline, but no focal     defect.  She has required several urethra/bladder dilatations.   PAST SURGICAL HISTORY:  1. Hysterectomy.  2. Four back surgeries with fusions.  3. Appendectomy.  4. Laparoscopic Nissen 1999.  5. Urethra and bladder dilatations.  6. Surgery for ectopic pregnancy.  7. Appendectomy.  8. Tonsillectomy.  9. Left inguinal hernia repair.  10.      Cholecystectomy.  11.      Left breast lumpectomy.   FAMILY HISTORY:  Grandmother died of colon cancer.  Husband had hepatitis C,  however, has sustained viral response after taking PEG-Intron.  Father  recently had MI.  Family history is significant for diabetes mellitus.    SOCIAL HISTORY:  She is married and has one child.  She has been smoking for  over 33 years, currently smoking one pack per day.  Denies any alcohol use.  She is on disability.   REVIEW OF SYSTEMS:  Please see HPI for GI.  GENITOURINARY:  Complains of  urinary hesitancy at times.  CARDIOPULMONARY:  Denies chest pain.  She uses  inhalers presumably for asthma or COPD.   PHYSICAL EXAMINATION:  VITAL SIGNS:  Blood pressure 102/70, pulse 90.  GENERAL:  Pleasant, well-developed, obese Caucasian female in no acute  distress.  She is in a wheelchair.  SKIN:  Warm and dry.  No jaundice.  HEENT:  Pupils are equal, round, and reactive to light.  Conjunctivae are  pink.  Sclerae nonicteric.  Oropharyngeal mucosa moist and pink.  No  lesions, erythema, or exudate.  NECK:  No lymphadenopathy, thyromegaly.  CHEST:  Lungs are clear to auscultation.  CARDIAC:  Regular rate and rhythm.  Normal S1, S2.  No murmurs, rubs, or  gallops.  ABDOMEN:  Obese, but symmetrical.  Positive bowel sounds.  Mild tenderness  in the entire lower abdomen.  No organomegaly or masses.  No abdominal  hernias appreciated.  EXTREMITIES:  No edema.   IMPRESSION:  The patient is a pleasant 55 year old lady with history of  chronic lower abdominal pain.  We initiated work-up as outlined above.  However, patient failed to follow-up and missed four or five office visits.  She tells me she continues to have abdominal pain.  Recently, she developed  large volume hematochezia associated with abdominal pain.  This may be  secondary to ischemic colitis (although celiac, SMA, and IMA all appeared  patent).  Other etiologies include inflammatory bowel disease or infectious  colitis.  She may have had bleeding from benign anorectal source such as  hemorrhoids as well.  We previously tried to obtain colonoscopy for further  review.  We checked with all locations that she provided and only came up  with a flexible sigmoidoscopy only.  At  this time given recent episode of  large volume hematochezia, I do feel we need to proceed with colonoscopy.   PLAN:  1. Colonoscopy in the near future.  2. Further recommendations to follow.     [name], P.A.                        Lionel December, M.D.    LL/MEDQ  D:  02/19/2002  T:  02/19/2002  Job:  [id number]

## 2010-05-19 NOTE — Discharge Summary (Signed)
NAMEMarland Kitchen  Tonya Sullivan, Tonya Sullivan NO.:  [id number]   MEDICAL RECORD NO.:  [id number]          PATIENT TYPE:  INP   LOCATION:  3007                         FACILITY:  [facility]   PHYSICIAN:  Stefani Dama, M.D.  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   OPERATIONS AND PROCEDURES:  Removal of hardware L4 to the sacrum and  decompression L3-4 and bilateral laminectomy and total diskectomy, L3-4  with posterolateral interbody fusion.   BRIEF HOSPITAL COURSE AND HISTORY:  The patient is a 56 year old female  who has had a previous fusion and arthrodesis L4 to the sacrum who had  adjacent segment disease at L3-4, failed conservative measures, and  elects to proceed with removal of hardware and fusion of the adjacent  segment and decompression.  She tolerated the procedure well on April 06, 2008, had an estimated blood loss of 500 mL.  She was stabilized in the  recovery room, placed on the floor.  First day postoperatively, the  patient was doing well.  She was using a PCA Dilaudid pump for pain  control.  She had been stung by wasps prior to her visit to the hospital  and needed some Benadryl for this.  She had some itching and swelling on  her face from this thing.  She had no focal deficits.  Her vital signs  were stable.  She was neurovascularly intact.  Wound dressing was  reinforced.  She had some amount of drainage.  Foley catheter was  discontinued.  Dressing was changed.  Betadine was painted to the  incision.  Physical therapy and occupational therapy was started and  discharge planning was arranged.  Second day postoperatively, she had  been  weaned off PCA pump.  She had stabilized her postoperative acute  blood loss anemia 10.6, hematocrit of 31.1.  Neurovascularly intact.  Wound benign.  Staples were place.  She had some residual back pain.  Rolling walker was ordered for home use.  Discharge planning for April 09, 2008.  Milk of magnesia and Colace to get her bowels moving and the  patient was comfortable with getting her bowels moving on her own at  home with her own regimen.  She denied dizziness, headaches,  asymptomatic hypotension.  Arrangements were made for discharge home on  April 09, 2008.  Her staples were being discontinued prior to her  discharge home on April 09, 2008.  Dressing was removed, left open in the  air.  Continue to follow back precautions.  Her discharge medications  are Percocet and Valium and OxyContin; and she will continue on her home  medications of Premarin, Neurontin, Flagyl, Robaxin, Celexa,  lamotrigine, trazodone,  fish oil, calcium, vitamin A, and vitamin D.  Contact our office prior  to followup  if she has any question or concerns.  Home health physical  therapy if needed.   DISCHARGE CONDITION:  Stable, improved.      Tonya Sullivan.      Stefani Dama, M.D.  Electronically Signed    SCI/MEDQ  D:  05/05/2008  T:  05/06/2008  Job:  [id number]

## 2010-05-19 NOTE — Op Note (Signed)
NAME:  Tonya Sullivan, Tonya Sullivan                          ACCOUNT NO.:  [id number]   MEDICAL RECORD NO.:  [id number]                   PATIENT TYPE:  AMB   LOCATION:  DAY                                  FACILITY:  [facility]   PHYSICIAN:  Lionel December, M.D.                 DATE OF BIRTH:  02/12/1955   DATE OF PROCEDURE:  03/11/2002  DATE OF DISCHARGE:                                 OPERATIVE REPORT   PROCEDURE:  Total colonoscopy.   INDICATIONS FOR PROCEDURE:  The patient is a 55 year old Caucasian female  with multiple medical problems, including chronic abdominal pain with a  negative workup in the past who presents with a single episode of large-  volume hematochezia over two weeks ago.  She is undergoing diagnostic  colonoscopy.  The procedure was reviewed with the patient and informed  consent was obtained.   PREOPERATIVE MEDICATIONS:  Demerol 100 mg IV in divided doses, Versed 12 mg  IV in divided doses.   INSTRUMENT USED:  The Olympus video system.   FINDINGS:  The procedure was performed in the endoscopy suite.  The  patient's vital signs and O2 saturations were monitored during the procedure  and remained stable.  Despite a fairly dose of sedation, she remained awake  but seemed to do well during the procedure.  The patient was placed in the  left lateral position and rectal examination performed.  No abnormality  noted on external or digital exam.  The scope was placed in the rectum and  advanced to the region of the sigmoid colon and beyond.  The preparation was  fair.  The scope was passed to the cecum which was identified by the  appendiceal stump/orifice and ileocecal valve.  A short segment of TR was  also examined and was normal.  The colonic mucosa was once again carefully  examined, and there were no polyps or tumor masses.  Focal erythema was  noted at the sigmoid colon, but I did not see any ulcers.  The rectal mucosa  was normal.  The scope was retroflexed to examine the  anorectal junction.  Small hemorrhoids were noted below the dentate line.  The endoscope was  straightened and withdrawn.  The patient tolerated the procedure well.   FINAL DIAGNOSIS:  Small external hemorrhoids, otherwise normal examination.  Suspect recent hematochezia due to hemorrhoids of colitis which has  resolved.    RECOMMENDATIONS:  She will resume her usual medications.  I would suggest  she take Citrucel one tablespoon or equivalent.  If bleeding is recurrent,  she will be reevaluated in the office.                                               Lionel December, M.D.  NR/MEDQ  D:  03/11/2002  T:  03/11/2002  Job:  [id number]   cc:   Kirstie Peri  [address]  [place]  [place] [address]  Fax: [phone]

## 2010-05-25 ENCOUNTER — Encounter (INDEPENDENT_AMBULATORY_CARE_PROVIDER_SITE_OTHER): Payer: Medicare Other | Admitting: Psychiatry

## 2010-05-25 DIAGNOSIS — F319 Bipolar disorder, unspecified: Secondary | ICD-10-CM

## 2010-06-15 ENCOUNTER — Encounter (INDEPENDENT_AMBULATORY_CARE_PROVIDER_SITE_OTHER): Payer: Medicare Other | Admitting: Psychiatry

## 2010-06-15 DIAGNOSIS — F319 Bipolar disorder, unspecified: Secondary | ICD-10-CM

## 2010-07-04 ENCOUNTER — Encounter (INDEPENDENT_AMBULATORY_CARE_PROVIDER_SITE_OTHER): Payer: Medicare Other | Admitting: Psychiatry

## 2010-07-04 DIAGNOSIS — F3189 Other bipolar disorder: Secondary | ICD-10-CM

## 2010-07-06 ENCOUNTER — Encounter (INDEPENDENT_AMBULATORY_CARE_PROVIDER_SITE_OTHER): Payer: Medicare Other | Admitting: Psychiatry

## 2010-07-06 DIAGNOSIS — F319 Bipolar disorder, unspecified: Secondary | ICD-10-CM

## 2010-07-11 ENCOUNTER — Encounter (HOSPITAL_COMMUNITY): Payer: Medicare Other | Admitting: Psychiatry

## 2010-07-19 ENCOUNTER — Encounter (INDEPENDENT_AMBULATORY_CARE_PROVIDER_SITE_OTHER): Payer: Medicare Other | Admitting: Psychiatry

## 2010-07-19 DIAGNOSIS — F319 Bipolar disorder, unspecified: Secondary | ICD-10-CM

## 2010-08-01 ENCOUNTER — Encounter (INDEPENDENT_AMBULATORY_CARE_PROVIDER_SITE_OTHER): Payer: Medicare Other | Admitting: Psychiatry

## 2010-08-01 DIAGNOSIS — F3189 Other bipolar disorder: Secondary | ICD-10-CM

## 2010-08-02 ENCOUNTER — Encounter (INDEPENDENT_AMBULATORY_CARE_PROVIDER_SITE_OTHER): Payer: Medicare Other | Admitting: Psychiatry

## 2010-08-02 DIAGNOSIS — F319 Bipolar disorder, unspecified: Secondary | ICD-10-CM

## 2010-08-16 ENCOUNTER — Encounter (INDEPENDENT_AMBULATORY_CARE_PROVIDER_SITE_OTHER): Payer: Medicare Other | Admitting: Psychiatry

## 2010-08-16 DIAGNOSIS — F319 Bipolar disorder, unspecified: Secondary | ICD-10-CM

## 2010-08-29 ENCOUNTER — Encounter (HOSPITAL_COMMUNITY): Payer: Medicare Other | Admitting: Psychiatry

## 2010-08-30 ENCOUNTER — Encounter (HOSPITAL_COMMUNITY): Payer: Medicare Other | Admitting: Psychiatry

## 2010-08-31 ENCOUNTER — Encounter (HOSPITAL_COMMUNITY): Payer: Medicare Other | Admitting: Psychiatry

## 2010-09-01 ENCOUNTER — Encounter (HOSPITAL_COMMUNITY): Payer: Medicare Other | Admitting: Psychiatry

## 2010-09-12 ENCOUNTER — Encounter (INDEPENDENT_AMBULATORY_CARE_PROVIDER_SITE_OTHER): Payer: Medicare Other | Admitting: Psychiatry

## 2010-09-12 DIAGNOSIS — F3189 Other bipolar disorder: Secondary | ICD-10-CM

## 2010-09-13 ENCOUNTER — Encounter (INDEPENDENT_AMBULATORY_CARE_PROVIDER_SITE_OTHER): Payer: Medicare Other | Admitting: Psychiatry

## 2010-09-13 DIAGNOSIS — F319 Bipolar disorder, unspecified: Secondary | ICD-10-CM

## 2010-09-13 NOTE — Progress Notes (Signed)
NAMEMarland Kitchen  Tonya Sullivan, Tonya Sullivan                ACCOUNT NO.:  [id number]  MEDICAL RECORD NO.:  [id number]  LOCATION:  [facility]                           FACILITY:  [facility]  PHYSICIAN:  Syed T. Arfeen, M.D.   DATE OF BIRTH:  May 19, 1955                                PROGRESS NOTE  Date, 09/12/10 The patient is 55 year old Caucasian female who came in today for her followup appointment.  She came in in a crisis as recently her husband died all of sudden while he was sleeping.  The patient's husband was recently diagnosed with melanoma and was started on the chemotherapy. However, after one treatment of chemotherapy the patient's husband died suddenly in the night.  The patient witnessed the death.  She even tried CPR, however, when the ambulance came he was already found dead on the bed.  The patient admitted has been very depressed, tearful and going through the grief.  In the beginning she admitted having some suicidal thoughts but she was able to distract herself.  Her family has been very supportive.  One of her brother and sister from [place] came to visit her and stayed with her.  She is also getting closer to her mother, son and granddaughter who live in [place].  She has been in touch with them on a daily basis and recently she has stayed few nights at her mother's place in [place].  The patient admitted that it was shock for her.  Initially she was very confused and does not know what to do, but recently she has been trying to get herself together.  She has been not sleeping very well and there are times when she is thinking a lot about him.  She admitted taking extra Klonopin that helps her anxiety and would like to know if she can have extra pills for episodic anxiety attacks.  The patient is also scheduled to see therapist tomorrow.  The patient admitted that she was doing very good before this incident happened.  She had even started walking, controlling her died.  However, lately she  has gained some weight as she is not able to take care of herself.  Her weight today was 198 which is actually increased 3 or 4 pounds extra from her last visit.  We had tried Abilify on her last two visits, which she liked and would like to continue the same medication.  MENTAL STATUS EXAM: The patient is tearful, crying easily, very sad, but maintaining good eye contact.  Her speech is slow, soft, clear and coherent.  Her thought process was logical, linear and goal-directed.  The were no delusion or psychotic symptoms, though she described her mood as depressed and sad. Her affect was constricted.  She denies any auditory hallucinations, suicidal thoughts or any active or passive homicidal thoughts.  She is alert and oriented x3.  Her insight, judgment, impulse control were okay.  DIAGNOSIS: AXIS I:  Bipolar disorder, recently going through grief due to loss of her husband.  PLAN: I talked to the patient at length about the loss of her husband and grief process.  I encouraged her to see the therapist more often as she is going through  the grief process. I will give 10 extra Klonopin for episodic anxiety and panic attack. We will continue her Abilify and Celexa which have been helping her.  At this time she is not reporting any side effects of medication. She is scheduled to see Peggy tomorrow. I will see her again in 2 weeks.     Syed T. Lolly Mustache, M.D.     STA/MEDQ  D:  09/12/2010  T:  09/12/2010  Job:  [id number]  Electronically Signed by Kathryne Sharper M.D. on 09/13/2010 09:37:36 AM

## 2010-09-20 ENCOUNTER — Encounter (INDEPENDENT_AMBULATORY_CARE_PROVIDER_SITE_OTHER): Payer: Medicare Other | Admitting: Psychiatry

## 2010-09-20 DIAGNOSIS — F319 Bipolar disorder, unspecified: Secondary | ICD-10-CM

## 2010-09-25 ENCOUNTER — Ambulatory Visit (INDEPENDENT_AMBULATORY_CARE_PROVIDER_SITE_OTHER): Payer: Medicare Other | Admitting: Family Medicine

## 2010-09-25 ENCOUNTER — Encounter: Payer: Self-pay | Admitting: Family Medicine

## 2010-09-25 VITALS — BP 112/82 | HR 100 | Resp 16 | Ht 67.0 in | Wt 196.4 lb

## 2010-09-25 DIAGNOSIS — Z23 Encounter for immunization: Secondary | ICD-10-CM

## 2010-09-25 DIAGNOSIS — B079 Viral wart, unspecified: Secondary | ICD-10-CM

## 2010-09-25 DIAGNOSIS — F32A Depression, unspecified: Secondary | ICD-10-CM

## 2010-09-25 DIAGNOSIS — G8929 Other chronic pain: Secondary | ICD-10-CM

## 2010-09-25 DIAGNOSIS — E785 Hyperlipidemia, unspecified: Secondary | ICD-10-CM

## 2010-09-25 DIAGNOSIS — F329 Major depressive disorder, single episode, unspecified: Secondary | ICD-10-CM

## 2010-09-25 DIAGNOSIS — F3289 Other specified depressive episodes: Secondary | ICD-10-CM

## 2010-09-25 MED ORDER — PRAVASTATIN SODIUM 40 MG PO TABS: 40.0000 mg | ORAL_TABLET | Freq: Every day | ORAL | Status: DC

## 2010-09-25 NOTE — Progress Notes (Signed)
  Subjective:    Patient ID: Tonya Sullivan, female    DOB: 1955/10/22, 55 y.o.   MRN: [id number]  HPI  Pt here to establish care, previous PCP Dr. Janna Arch, medications and history reviewed  Pain clinic- [facility], in [place]- has had multiple back surgeries, Oxycontin 15mg  BID, oxycodone prn   Anxiety/depression- follows with Peggy and Dr. Lolly Mustache- next appt with Peggy, couselor this week. Her husband of 30 years passed away a few weeks ago. Currently taking Abilify, and Klonipin , she currently lives alone  Hyperlipidemia- told she has some build-up in her neck vessels, put on statin, not sure what her cholesterol was  Wart on finger since April, has tried OTC meds, used duct tape etc, would like to have it removed   Review of Systems  GEN- denies fatigue, fever, weight loss,weakness, recent illness CVS- denies chest pain, palpitations RESP- denies SOB, cough, wheeze ABD- denies N/V, change in stools, abd pain GU- denies dysuria, hematuria, dribbling, incontinence MSK- + joint pain, +muscle aches, injury Neuro- denies headache, dizziness, syncope, seizure activity      Objective:   Physical Exam GEN- NAD, alert and oriented x3 HEENT- PERRL, EOMI, , MMM, oropharynx clear Neck- Supple, no thryomegaly, no carotid bruit CVS- RRR, no murmur RESP-CTAB EXT- No edema Pulses- Radial, DP- 2+ Psych- flat affect, sad and depressed appearing, no apparent SI Skin- wart on right index finger      Assessment & Plan:

## 2010-09-25 NOTE — Patient Instructions (Addendum)
Continue your current medications I will get records and labs from Dr. Janna Arch Continue to f/u with your psychiatrist I will refer you to the dermatologist to have the wart removed We will set you up for a Mammopgram F/U in Feb 2013

## 2010-09-26 DIAGNOSIS — G8929 Other chronic pain: Secondary | ICD-10-CM | POA: Insufficient documentation

## 2010-09-26 DIAGNOSIS — F329 Major depressive disorder, single episode, unspecified: Secondary | ICD-10-CM | POA: Insufficient documentation

## 2010-09-26 DIAGNOSIS — F32A Depression, unspecified: Secondary | ICD-10-CM | POA: Insufficient documentation

## 2010-09-26 DIAGNOSIS — E785 Hyperlipidemia, unspecified: Secondary | ICD-10-CM | POA: Insufficient documentation

## 2010-09-26 NOTE — Assessment & Plan Note (Signed)
Refilled meds, obtain last set of labs

## 2010-09-26 NOTE — Assessment & Plan Note (Signed)
Pt currently has pain clinic. Her meds will be filled by them

## 2010-09-26 NOTE — Assessment & Plan Note (Signed)
Refer to derm to have wart on hand removed. Pt has tried OTC methods, our office does not have any freezing methods

## 2010-09-26 NOTE — Assessment & Plan Note (Signed)
Currently in grieving period, with recent passing of her husband. F/u with psych and couselor this week

## 2010-09-27 ENCOUNTER — Encounter (INDEPENDENT_AMBULATORY_CARE_PROVIDER_SITE_OTHER): Payer: Medicare Other | Admitting: Psychiatry

## 2010-09-27 DIAGNOSIS — F319 Bipolar disorder, unspecified: Secondary | ICD-10-CM

## 2010-10-03 ENCOUNTER — Encounter (INDEPENDENT_AMBULATORY_CARE_PROVIDER_SITE_OTHER): Payer: Medicare Other | Admitting: Psychiatry

## 2010-10-03 DIAGNOSIS — F3189 Other bipolar disorder: Secondary | ICD-10-CM

## 2010-10-04 ENCOUNTER — Encounter (HOSPITAL_COMMUNITY): Payer: Medicare Other | Admitting: Psychiatry

## 2010-10-06 ENCOUNTER — Encounter (INDEPENDENT_AMBULATORY_CARE_PROVIDER_SITE_OTHER): Payer: Medicare Other | Admitting: Psychiatry

## 2010-10-06 DIAGNOSIS — F319 Bipolar disorder, unspecified: Secondary | ICD-10-CM

## 2010-10-20 ENCOUNTER — Encounter (INDEPENDENT_AMBULATORY_CARE_PROVIDER_SITE_OTHER): Payer: Medicare Other | Admitting: Psychiatry

## 2010-10-20 DIAGNOSIS — F319 Bipolar disorder, unspecified: Secondary | ICD-10-CM

## 2010-10-24 ENCOUNTER — Encounter (INDEPENDENT_AMBULATORY_CARE_PROVIDER_SITE_OTHER): Payer: Medicare Other | Admitting: Psychiatry

## 2010-10-24 DIAGNOSIS — F3189 Other bipolar disorder: Secondary | ICD-10-CM

## 2010-11-03 ENCOUNTER — Encounter (INDEPENDENT_AMBULATORY_CARE_PROVIDER_SITE_OTHER): Payer: Medicare Other | Admitting: Psychiatry

## 2010-11-03 DIAGNOSIS — F319 Bipolar disorder, unspecified: Secondary | ICD-10-CM

## 2010-11-06 ENCOUNTER — Ambulatory Visit (INDEPENDENT_AMBULATORY_CARE_PROVIDER_SITE_OTHER): Payer: Medicare Other | Admitting: Family Medicine

## 2010-11-06 ENCOUNTER — Encounter: Payer: Self-pay | Admitting: Family Medicine

## 2010-11-06 DIAGNOSIS — N3090 Cystitis, unspecified without hematuria: Secondary | ICD-10-CM

## 2010-11-06 LAB — POCT URINALYSIS DIPSTICK
Bilirubin, UA: NEGATIVE
Glucose, UA: NEGATIVE
Ketones, UA: NEGATIVE
Leukocytes, UA: NEGATIVE
Nitrite, UA: NEGATIVE

## 2010-11-06 MED ORDER — CEPHALEXIN 500 MG PO CAPS: 500.0000 mg | ORAL_CAPSULE | Freq: Two times a day (BID) | ORAL | Status: AC

## 2010-11-06 NOTE — Progress Notes (Signed)
Addended by: Adella Hare B on: 11/06/2010 01:50 PM   Modules accepted: Orders

## 2010-11-06 NOTE — Progress Notes (Signed)
  Subjective:    Patient ID: Tonya Sullivan, female    DOB: January 03, 1955, 55 y.o.   MRN: [id number]  HPI Patient presents today with urinary pressure and frequency. She states she's not had much burning sensation but this is a change in her urinary pattern. She's concern for urinary tract infection. She's also had flank pain which is different from her previous chronic back pain. No recent history of antibiotics or UTI. Often when she goes to the restroom she has dribbling and has to return a few minutes later [place] to relieve the pressure sensation.   ROS- She denies fever, chills, abdominal pain, nausea, vomiting, blood in urine, change in stool, vaginal discharge Review of Systems - per above     Objective:   Physical Exam GEN- NAD, alert and oriented x 3 ABD- NABS,soft, TTP in suprapubic region, equivocal tenderness over left flank-, neg CVA right side, ND       Assessment & Plan:   Cystitis- we will treat for urinary tract infection with Keflex twice a day x3 days, based on current symptoms.. Will send for culture with the blood noted,.    Microscopic hematuria- She is a smoker therefore will repeat urine at her next visit to see she still has microscopic hematuria. If this is the case and she will be sent to urology for followup.    No patient asked about going to a new pain management doctor as she has to drive to [facility]. I gave her the number for [facility] medical pain management which is a new clinic. She will call them and see if she will be able to follow a specific goals. One of the problem she had with a reasonable pain clinic is that she was unable to pay the co-pay every month and attend the classes. If she feels she can do this then I will refer her to the [facility] pain management Center

## 2010-11-06 NOTE — Patient Instructions (Signed)
Take the antibiotic for your urine infection On your next visit we will recheck your urine for blood Call the [facility] [phone] We will call if the antibiotics need to be changed

## 2010-11-07 ENCOUNTER — Telehealth: Payer: Self-pay | Admitting: Family Medicine

## 2010-11-08 LAB — URINE CULTURE
Colony Count: NO GROWTH
Organism ID, Bacteria: NO GROWTH

## 2010-11-17 ENCOUNTER — Encounter (HOSPITAL_COMMUNITY): Payer: Self-pay | Admitting: Psychiatry

## 2010-11-17 ENCOUNTER — Ambulatory Visit (INDEPENDENT_AMBULATORY_CARE_PROVIDER_SITE_OTHER): Payer: Medicare Other | Admitting: Psychiatry

## 2010-11-17 DIAGNOSIS — F319 Bipolar disorder, unspecified: Secondary | ICD-10-CM

## 2010-11-17 NOTE — Progress Notes (Signed)
Patient:  Tonya Sullivan   DOB: 02/01/1955  MR Number: [id number]  Location: [facility]:  [address] [place] [place],  [place], [address]  Start: Friday 11/17/2010 1 PM End: Friday 11/17/2010 2 PM  Provider/Observer:     Florencia Reasons, MSW, LCSW   Chief Complaint:      Chief Complaint  Patient presents with  . Other    Mood Disorder    Reason For Service:     The patient was referred for services by psychiatrist Dr. Lolly Mustache due to patient experiencing anxiety, stress, depression, and mood swings. She has a long-standing history of depression beginning in adolescence. Patient also has significant trauma history. Patient also is experiencing grief and loss issues due to to the death of her husband in 10/11/10.  Interventions Strategy:  Supportive therapy, grief therapy  Participation Level:   Active  Participation Quality:  Appropriate      Behavioral Observation:  Fairly Groomed, Alert, and Tearful.   Current Psychosocial Factors: The patient reports stress related to staying alone now that her sister has returned to [place]. She also is experiencing stress related to facing her first Thanksgiving holiday without her husband.  Content of Session:   Reviewing symptoms, identifying stressors, processing grief and loss , identifying ways to use support system  Current Status:   Patient experiences decreased appetite, sadness, sleep difficulty, and crying spells..  Patient Progress:   Good. Patient reports increased thoughts about her deceased husband as she now is alone at home states her sister return to [place] about a week ago. She is tearful throughout the session today and expresses anxiety about coping with her first Thanksgiving holiday without her husband. She is planning to have dinner at her mother's home but expresses frustration as her mother does not understand patient's grief. Therapist works with patient to identify ways to cope and acknowledge her feelings as  well as memories of her husband on Thanksgiving. Patient reports increased contact with her son in the past week. She remains cautious about their relationship but is very pleased with their contact.  Target Goals:   1. Improving mood. 2. Improving coping skills  Last Reviewed:   07/12/09  Goals Addressed Today:    Improving coping skills  Impression/Diagnosis:   The patient presents with a long-standing history of recurrent depression, mood swings, and a significant trauma history.  Diagnosis: Bipolar disorder  Diagnosis:  Axis I:  1. Bipolar disorder             Axis II: Deferred

## 2010-11-17 NOTE — Patient Instructions (Signed)
Improve self-care regarding nutrition, contact grief support group, use support system

## 2010-11-28 ENCOUNTER — Encounter (HOSPITAL_COMMUNITY): Payer: Medicare Other | Admitting: Psychiatry

## 2010-11-28 ENCOUNTER — Ambulatory Visit (HOSPITAL_COMMUNITY): Payer: Medicare Other | Admitting: Psychiatry

## 2010-11-30 ENCOUNTER — Encounter (HOSPITAL_COMMUNITY): Payer: Self-pay | Admitting: Psychiatry

## 2010-11-30 ENCOUNTER — Ambulatory Visit (INDEPENDENT_AMBULATORY_CARE_PROVIDER_SITE_OTHER): Payer: Medicare Other | Admitting: Psychiatry

## 2010-11-30 DIAGNOSIS — F319 Bipolar disorder, unspecified: Secondary | ICD-10-CM

## 2010-11-30 NOTE — Patient Instructions (Signed)
Continue to improve self-care
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
## 2010-12-04 ENCOUNTER — Telehealth: Payer: Self-pay | Admitting: Family Medicine

## 2010-12-05 ENCOUNTER — Other Ambulatory Visit: Payer: Self-pay | Admitting: Family Medicine

## 2010-12-05 DIAGNOSIS — G8929 Other chronic pain: Secondary | ICD-10-CM

## 2010-12-05 NOTE — Telephone Encounter (Signed)
Had spoke with Dr Jeanice Lim at last visit about referral to closer pain management clinic and Dr Jeanice Lim recommended [facility] but referral still needs to be made. (not refill)

## 2010-12-05 NOTE — Telephone Encounter (Signed)
pls refer pt to pain center either with Dr Gerilyn Pilgrim or Maryruth Bun, let pt know this is being doen, soonest appt that is available is the one she should have

## 2010-12-05 NOTE — Telephone Encounter (Signed)
I only saw where this was filled in September,   So i will not be filling it, if you have further info on this pls let me know

## 2010-12-06 NOTE — Telephone Encounter (Signed)
Faxed over papers to Dr. Harriett Sine

## 2010-12-06 NOTE — Telephone Encounter (Signed)
Called pt no answer.  Left message to call back.

## 2010-12-08 NOTE — Telephone Encounter (Signed)
Called with no answer. Left message for pt to return call.

## 2010-12-12 ENCOUNTER — Ambulatory Visit (INDEPENDENT_AMBULATORY_CARE_PROVIDER_SITE_OTHER): Payer: Medicare Other | Admitting: Psychiatry

## 2010-12-12 ENCOUNTER — Encounter (HOSPITAL_COMMUNITY): Payer: Self-pay | Admitting: Psychiatry

## 2010-12-12 VITALS — Wt 208.0 lb

## 2010-12-12 DIAGNOSIS — F329 Major depressive disorder, single episode, unspecified: Secondary | ICD-10-CM

## 2010-12-12 MED ORDER — CITALOPRAM HYDROBROMIDE 40 MG PO TABS: 40.0000 mg | ORAL_TABLET | Freq: Every day | ORAL | Status: DC

## 2010-12-12 MED ORDER — ARIPIPRAZOLE 10 MG PO TABS: 10.0000 mg | ORAL_TABLET | Freq: Every day | ORAL | Status: DC

## 2010-12-12 MED ORDER — CLONAZEPAM 0.5 MG PO TABS: 0.5000 mg | ORAL_TABLET | Freq: Every evening | ORAL | Status: DC | PRN

## 2010-12-12 NOTE — Progress Notes (Signed)
Tonya A Clark10/08/[id number]  Subjective Patient came in for her followup appointment. She continued to endorse poor sleep and restlessness but like her Abilify. She is a quite Thanksgiving and excited as her son coming from [place] for Christmas. This will be her first Christmas since her husband died 2 months ago. She still have residual depression but her crying spells or less intense and less frequent. She is wondering if she can reduce her Celexa to 40 mg but she was taking in the past. I believe increasing her Celexa making her more jittery in the night and causing insomnia.  Mental Status Examination Patient is casually dressed and fairly groomed. She appears tired but well related in conversation. She denies any auditory or visual hallucination. She denies any active or passive suicidal thoughts or homicidal thoughts. Her attention and concentration is fair. Her speech is slow but clear and coherent. There no psychotic symptoms present. There no extrapyramidal side effects tremors or shakes present. She's alert and oriented x3. Her insight judgment and impulse control is okay  Current Medication Current outpatient prescriptions:ARIPiprazole (ABILIFY) 10 MG tablet, Take 1 tablet (10 mg total) by mouth daily., Disp: 30 tablet, Rfl: 1;  citalopram (CELEXA) 40 MG tablet, Take 1 tablet (40 mg total) by mouth daily., Disp: 30 tablet, Rfl: 1;  clonazePAM (KLONOPIN) 0.5 MG tablet, Take 1 tablet (0.5 mg total) by mouth at bedtime as needed for anxiety. Take 1-2 tablets every 4-6 hours as needed- Dr. Lolly Mustache, Disp: 30 tablet, Rfl: 1 celecoxib (CELEBREX) 200 MG capsule, Take 200 mg by mouth 2 (two) times daily.  , Disp: , Rfl: ;  oxyCODONE (OXYCONTIN) 15 MG TB12, Take 15 mg by mouth every 12 (twelve) hours.  , Disp: , Rfl: ;  oxyCODONE-acetaminophen (PERCOCET) 10-325 MG per tablet, Take by mouth. 1 tablet one to two times a day as needed-pain clinci , Disp: , Rfl: ;  pravastatin (PRAVACHOL) 40 MG tablet,  Take 1 tablet (40 mg total) by mouth at bedtime., Disp: 30 tablet, Rfl: 6   Assessment Bipolar disorder NOS, uncomplicated grief  Plan I will reduce her Celexa to 40 mg daily. We will continue Klonopin 0.5 mg at bedtime for anxiety and Abilify 10 mg at bedtime. I have explained risks and benefits of medication in length. I recommended to call if she feels worsening of her depression or crying spells. She will continue to see therapist regularly. I will see her again in 6-8 weeks

## 2010-12-14 ENCOUNTER — Encounter (HOSPITAL_COMMUNITY): Payer: Self-pay | Admitting: Psychiatry

## 2010-12-14 ENCOUNTER — Ambulatory Visit (INDEPENDENT_AMBULATORY_CARE_PROVIDER_SITE_OTHER): Payer: Medicare Other | Admitting: Psychiatry

## 2010-12-14 DIAGNOSIS — F319 Bipolar disorder, unspecified: Secondary | ICD-10-CM

## 2010-12-14 NOTE — Patient Instructions (Signed)
Discussed orally 

## 2010-12-14 NOTE — Progress Notes (Signed)
Patient:  Tonya Sullivan   DOB: 1955-03-23  MR Number: [id number]  Location: [facility]:  [address] [place] [place],  [place], [address]  Start: Thursday 12/14/2010 1 PM End: Thursday 12/14/2010 2 PM  Provider/Observer:     Florencia Reasons, MSW, LCSW   Chief Complaint:      Chief Complaint  Patient presents with  . Other    Mood Disorder    Reason For Service:     The patient was referred for services by psychiatrist Dr. Lolly Mustache due to patient experiencing anxiety, stress, depression, and mood swings. She has a long-standing history of depression beginning in adolescence. Patient also has significant trauma history. Patient also is experiencing grief and loss issues due to to the death of her husband in 10/02/2010.  Interventions Strategy:  Supportive therapy, grief therapy  Participation Level:   Active  Participation Quality:  Appropriate      Behavioral Observation:  Fairly Groomed, Alert, and Tearful.   Current Psychosocial Factors: The patient continues to experience stress related to the recent death of her husband. She also expresses concerns about her oldest granddaughter. Her son was granted custody of patient's granddaughter yesterday but does not know her whereabouts at this point. Patient fears that her granddaughter's mother may be trying to keep her away from her father.  Content of Session:   Reviewing symptoms, identifying stressors, processing grief and loss , identifying ways to use support system, identifying memorializations  Current Status:   Patient continues to experience decreased appetite, sadness, sleep difficulty, and crying spells. She also reports increased fatigue.  Patient Progress:   Good. Patient reports continued thoughts about her deceased husband. She reports missing her husband especially now when she is trying to the supportive to her son who is trying to get physical custody of his daughter. Patient reports missing being able to talk  with her husband about this. She also is facing her first Christmas without her husband. She is thankful that her son along with his family plan to stay with patient Christmas Eve and Christmas day.   Target Goals:   1. Improving mood. 2. Improving coping skills  Last Reviewed:   07/12/09  Goals Addressed Today:    Improving coping skills  Impression/Diagnosis:   The patient presents with a long-standing history of recurrent depression, mood swings, and a significant trauma history.  Diagnosis: Bipolar disorder  Diagnosis:  Axis I:  1. Bipolar disorder             Axis II: Deferred

## 2010-12-15 NOTE — Telephone Encounter (Signed)
Called and spoke with pt and she stated that she was ok now and didn't need any additional meds.

## 2010-12-29 ENCOUNTER — Ambulatory Visit (HOSPITAL_COMMUNITY): Payer: Medicare Other | Admitting: Psychiatry

## 2011-01-05 ENCOUNTER — Encounter (HOSPITAL_COMMUNITY): Payer: Self-pay | Admitting: Psychiatry

## 2011-01-05 ENCOUNTER — Ambulatory Visit (INDEPENDENT_AMBULATORY_CARE_PROVIDER_SITE_OTHER): Payer: Medicare Other | Admitting: Psychiatry

## 2011-01-05 DIAGNOSIS — F319 Bipolar disorder, unspecified: Secondary | ICD-10-CM

## 2011-01-08 NOTE — Patient Instructions (Signed)
Discussed orally 

## 2011-01-08 NOTE — Progress Notes (Signed)
Patient:  Tonya Sullivan   DOB: 08/28/1955  MR Number: [id number]  Location: [facility]:  [address] [place] [place],  [place], [address]  Start: Friday 01/05/2011 3 PM End: Friday 01/05/2011 4 PM  Provider/Observer:     Florencia Reasons, MSW, LCSW   Chief Complaint:      Chief Complaint  Patient presents with  . Other    Mood Disorder    Reason For Service:     The patient was referred for services by psychiatrist Dr. Lolly Mustache due to patient experiencing anxiety, stress, depression, and mood swings. She has a long-standing history of depression beginning in adolescence. Patient also has significant trauma history. Patient also is experiencing grief and loss issues due to to the death of her husband in 09-19-2010.  Interventions Strategy:  Supportive therapy, grief therapy  Participation Level:   Active  Participation Quality:  Appropriate      Behavioral Observation:  Fairly Groomed, Alert, and Tearful.   Current Psychosocial Factors: The patient continues to experience stress related to the recent death of her husband.   Content of Session:   Reviewing symptoms, identifying stressors, processing grief and loss , identifying ways to expand support system and increase social contact, discussing possible participation in a grief support group, identifying community resources  Current Status:   Patient continues to experience decreased appetite, sadness, sleep difficulty, fatigue, crying spells, and increased memory difficulty.  Patient Progress:   Fair. Patient reports continued thoughts about her deceased husband. She reports celebrating Christmas with her son and his family. She made one of her husband's favorite dishes and discussed memories with her son as a way of honoring her husband.  Patient reports increased thoughts about cause of husband's death after recently receiving his death certificate.  She expresses guilt and sadness.  Patient reports decreased motivation and  states struggling to get out of the house or do anything.  Target Goals:   1. Improving mood. 2. Improving coping skills  Last Reviewed:   07/12/09  Goals Addressed Today:    Improving coping skills, improving mood  Impression/Diagnosis:   The patient presents with a long-standing history of recurrent depression, mood swings, and a significant trauma history.  Diagnosis: Bipolar disorder  Diagnosis:  Axis I:  Bipolar Disorder           Axis II: Deferred

## 2011-01-22 ENCOUNTER — Encounter (HOSPITAL_COMMUNITY): Payer: Self-pay | Admitting: Psychiatry

## 2011-01-22 ENCOUNTER — Ambulatory Visit (INDEPENDENT_AMBULATORY_CARE_PROVIDER_SITE_OTHER): Payer: Medicare Other | Admitting: Psychiatry

## 2011-01-22 DIAGNOSIS — F319 Bipolar disorder, unspecified: Secondary | ICD-10-CM

## 2011-01-22 NOTE — Patient Instructions (Signed)
Discussed orally 

## 2011-01-22 NOTE — Progress Notes (Signed)
Patient:  Tonya Sullivan   DOB: 20-Apr-1955  MR Number: [id number]  Location: [facility]:  [address] [place] [place],  [place], [address]  Start: Monday 01/22/2011 2:00 PM End: Monday 01/22/2011 2:45 PM  Provider/Observer:     Florencia Reasons, MSW, LCSW   Chief Complaint:      Chief Complaint  Patient presents with  . Depression    Reason For Service:     The patient was referred for services by psychiatrist Dr. Lolly Mustache due to patient experiencing anxiety, stress, depression, and mood swings. She has a long-standing history of depression beginning in adolescence. Patient also has significant trauma history. Patient also is experiencing grief and loss issues due to to the death of her husband in 2010/10/20.  Interventions Strategy:  Supportive therapy, grief therapy  Participation Level:   Active  Participation Quality:  Appropriate      Behavioral Observation:  Fairly Groomed, Alert, and Tearful.   Current Psychosocial Factors: The patient continues to experience stress related to the recent death of her husband.   Content of Session:   Reviewing symptoms, processing grief and loss , discussing possible participation in a grief support group,   Current Status:   Patient experiences depressed mood rating it as a 10 on a 10 point scale. She also reports continued crying spells once a day, loss of interest in activities, fatigue, and poor motivation  Patient Progress:   Fair. Patient reports continued thoughts about her deceased husband. She states feeling lonely but  feeling poorly motivated to pursue involvement in activities to increase social contact. She does maintain contact with her mother as well as her son and his family. She reports her son and his family stayed with her for 2 nights recently. She reports being more motivated during that time and enjoying the visit especially being with her grandchild. Patient continues to struggle with what if questions at times  regarding the events surrounding her husband's death.   Target Goals:   1. Improving mood. 2. Improving coping skills  Last Reviewed:   07/12/09  Goals Addressed Today:    Improving coping skills, improving mood  Impression/Diagnosis:   The patient presents with a long-standing history of recurrent depression, mood swings, and a significant trauma history.  Diagnosis: Bipolar disorder  Diagnosis:  Axis I:  Bipolar Disorder           Axis II: Deferred

## 2011-02-05 ENCOUNTER — Ambulatory Visit (INDEPENDENT_AMBULATORY_CARE_PROVIDER_SITE_OTHER): Payer: Medicare Other | Admitting: Psychiatry

## 2011-02-05 ENCOUNTER — Encounter (HOSPITAL_COMMUNITY): Payer: Self-pay | Admitting: Psychiatry

## 2011-02-05 DIAGNOSIS — F319 Bipolar disorder, unspecified: Secondary | ICD-10-CM
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
## 2011-02-05 NOTE — Patient Instructions (Signed)
Discussed orally 

## 2011-02-08 ENCOUNTER — Encounter (HOSPITAL_COMMUNITY): Payer: Self-pay | Admitting: Psychiatry

## 2011-02-08 ENCOUNTER — Ambulatory Visit (INDEPENDENT_AMBULATORY_CARE_PROVIDER_SITE_OTHER): Payer: Medicare Other | Admitting: Psychiatry

## 2011-02-08 VITALS — Wt 214.0 lb

## 2011-02-08 DIAGNOSIS — F329 Major depressive disorder, single episode, unspecified: Secondary | ICD-10-CM

## 2011-02-08 DIAGNOSIS — Z79899 Other long term (current) drug therapy: Secondary | ICD-10-CM

## 2011-02-08 MED ORDER — BUPROPION HCL ER (SR) 150 MG PO TB12: 150.0000 mg | ORAL_TABLET | ORAL | Status: DC

## 2011-02-08 MED ORDER — CITALOPRAM HYDROBROMIDE 40 MG PO TABS: 40.0000 mg | ORAL_TABLET | Freq: Every day | ORAL | Status: DC

## 2011-02-08 MED ORDER — CLONAZEPAM 0.5 MG PO TABS: 0.5000 mg | ORAL_TABLET | ORAL | Status: DC | PRN

## 2011-02-08 MED ORDER — ARIPIPRAZOLE 10 MG PO TABS: 10.0000 mg | ORAL_TABLET | Freq: Every day | ORAL | Status: DC

## 2011-02-08 NOTE — Progress Notes (Signed)
Patient came for her followup appointment. She continues to endorse depressive symptoms and crying spells. She had a quiet Christmas. Her son visited and he stayed with her during holidays. She continues to have grief about her husband. She remains isolated withdrawn with decreased energy and concentration. She has difficulty falling asleep and recently she had taking trazodone but does not feel that helped a lot. Patient is taking Celexa 40 mg along with Abilify 10 mg and Klonopin 0.5 mg. Patient is also seeing therapist however she feels she continues to struggle to dealing with her chronic depression and loss of her husband. She denies any side effects of medication.  Medical history Chronic back pain, hyperlipidemia, asthma  Mental status examination Patient is casually dressed and fairly groomed. She maintained fair eye contact. Her speech is slow but clear and coherent. Her attention and concentration is poor. She described her mood is depressed and anxious. Her affect is constricted. She denies any active or passive suicidal thoughts or homicidal thoughts. She denies any auditory or visual hallucination. There no psychotic symptoms present. She's alert and oriented x3. Her insight judgment and pulse control is okay.  Diagnoses Axis I bipolar disorder, rule out Maj. depressive disorder Axis II deferred Axis III see medical history Axis IV moderate Axis V 55-60  Plan At this time patient continues to struggle with her depression and grief. In the past she had tried Wellbutrin in a small her dose with good response however when she tried higher dose it gives her headache. She's like to try a small dose of Wellbutrin again. I will start Wellbutrin SR 150 mg in the morning and she will continue her Celexa and Abilify. I will also continue her Klonopin 0.5 mg. She does not take on a regular basis and never asked for early refill. She does not drink or use any illegal substances. I have explained  risks and benefits of medication. She will continue to see therapist for increase coping and social skills. I will consider blood work on the next visit. I will see her again in 3 weeks. Time spent 30 minutes

## 2011-02-09 LAB — COMPREHENSIVE METABOLIC PANEL
ALT: 19 U/L (ref 0–35)
AST: 20 U/L (ref 0–37)
Calcium: 10.3 mg/dL (ref 8.4–10.5)
Chloride: 100 mEq/L (ref 96–112)
Creat: 0.93 mg/dL (ref 0.50–1.10)
Sodium: 141 mEq/L (ref 135–145)
Total Protein: 7.5 g/dL (ref 6.0–8.3)

## 2011-02-09 LAB — CBC WITH DIFFERENTIAL/PLATELET
Basophils Absolute: 0 10*3/uL (ref 0.0–0.1)
Eosinophils Relative: 1 % (ref 0–5)
Lymphocytes Relative: 27 % (ref 12–46)
MCV: 93.9 fL (ref 78.0–100.0)
Neutro Abs: 4.5 10*3/uL (ref 1.7–7.7)
Neutrophils Relative %: 65 % (ref 43–77)
Platelets: 261 10*3/uL (ref 150–400)
RBC: 5.11 MIL/uL (ref 3.87–5.11)
RDW: 12.9 % (ref 11.5–15.5)
WBC: 6.9 10*3/uL (ref 4.0–10.5)

## 2011-02-19 ENCOUNTER — Ambulatory Visit (INDEPENDENT_AMBULATORY_CARE_PROVIDER_SITE_OTHER): Payer: Medicare Other | Admitting: Family Medicine

## 2011-02-19 ENCOUNTER — Encounter: Payer: Self-pay | Admitting: Family Medicine

## 2011-02-19 DIAGNOSIS — F329 Major depressive disorder, single episode, unspecified: Secondary | ICD-10-CM

## 2011-02-19 DIAGNOSIS — F17200 Nicotine dependence, unspecified, uncomplicated: Secondary | ICD-10-CM

## 2011-02-19 DIAGNOSIS — R109 Unspecified abdominal pain: Secondary | ICD-10-CM | POA: Insufficient documentation

## 2011-02-19 DIAGNOSIS — F319 Bipolar disorder, unspecified: Secondary | ICD-10-CM | POA: Insufficient documentation

## 2011-02-19 DIAGNOSIS — F32A Depression, unspecified: Secondary | ICD-10-CM

## 2011-02-19 DIAGNOSIS — E785 Hyperlipidemia, unspecified: Secondary | ICD-10-CM

## 2011-02-19 DIAGNOSIS — J449 Chronic obstructive pulmonary disease, unspecified: Secondary | ICD-10-CM

## 2011-02-19 DIAGNOSIS — R319 Hematuria, unspecified: Secondary | ICD-10-CM | POA: Insufficient documentation

## 2011-02-19 DIAGNOSIS — G8929 Other chronic pain: Secondary | ICD-10-CM

## 2011-02-19 DIAGNOSIS — F3289 Other specified depressive episodes: Secondary | ICD-10-CM

## 2011-02-19 DIAGNOSIS — Z720 Tobacco use: Secondary | ICD-10-CM

## 2011-02-19 DIAGNOSIS — M549 Dorsalgia, unspecified: Secondary | ICD-10-CM

## 2011-02-19 LAB — POCT URINALYSIS DIPSTICK
Glucose, UA: NEGATIVE
Protein, UA: NEGATIVE
Spec Grav, UA: 1.025

## 2011-02-19 NOTE — Progress Notes (Signed)
  Subjective:    Patient ID: Tonya Sullivan, female    DOB: 1955/12/15, 56 y.o.   MRN: [id number]  HPI Patient here for routine visit. She is concerned about abdominal cramping.  Abdominal pain-for the past 2 weeks she's had abdominal pain. It is located mostly in the lower part her and is nonradiating. She's been on doxycycline for recurrent boils prescribed by her GYN. She had 4 episodes of loose stool yesterday. Denies any blood in stool. Denies nausea vomiting. Denies dysuria or vaginal discharge.  COPD- has cut back to one pack per day. Tobacco has been her stress relievers and her husband died. She was recently placed on Wellbutrin for both her depression and her tobacco use by her psychiatrist.  Depression/bipolar-reviewed last psychiatry note. Patient was started on Wellbutrin. She is to continue Abilify and Celexa.  Chronic back pain-patient has a lot of difficulty with her ADLs. She is requesting Services today. She has difficulty standing for more than 30 minutes at a time. She also has pain with prolonged sitting. She has difficulty with cleaning her home she has some family members that help but she requires more than that. She has difficulty cooking her meals. She is been transferred to Dr. Janna Arch cough for pain management as this is closer. Her pain medication is now Percocet.  Hyperlipidemia-needs direct LDL.  Hematuria-patient had repeat blood in urine on more than 2 occasions. She is a heavy smoker. Will refer to urology for cystoscopy.  She states she's lost 12 pounds the last 2 weeks however I've only noted a 4 pound weight loss since November  Review of Systems  GEN- denies fatigue, fever, +weight loss,weakness, recent illness CVS- denies chest pain, palpitations RESP- denies SOB, cough, wheeze ABD- denies N/V, change in stools,+ abd pain GU- denies dysuria, hematuria, dribbling, incontinence MSK- + joint pain, +muscle aches, injury Neuro- denies headache, dizziness,  syncope, seizure activity     Objective:   Physical Exam GEN- NAD, alert and oriented x3,smells like tobacco HEENT- PERRL, EOMI, , MMM, oropharynx clear Neck- Supple, no thryomegaly, no carotid bruit CVS- RRR, no murmur RESP-CTAB ABD- NABS,soft, Mild TTP diffusely, no rebound, no gaurding, no suprapubic pressure, no organomegaly EXT- No edema Pulses- Radial, DP- 2+ Psych- flat affect,depressed appearing, not anxious appearing         Assessment & Plan:

## 2011-02-19 NOTE — Assessment & Plan Note (Signed)
Patient was on other meds in the past including nebulizer treatments. She's currently stable. Encouraged continued decreasing her tobacco use. Right now she has albuterol.

## 2011-02-19 NOTE — Assessment & Plan Note (Signed)
Patient is cutting back on tobacco. She's also on Wellbutrin. Willl continue to follow

## 2011-02-19 NOTE — Assessment & Plan Note (Signed)
Pain management, will refer for CAPS,  Note pt uses cane for assistance

## 2011-02-19 NOTE — Patient Instructions (Signed)
Continue to work on the smoking  We will set you up with urology for the blood in your urine  I will send a referral  For CAPS services Try the Activia for your abdominal pain, if the pain gets worse, you have increased diarrhea, nausea, vomiting or fever please call Continue your albuterol for your breathing Your labs look good.

## 2011-02-19 NOTE — Assessment & Plan Note (Signed)
Followed by psychiatry. She continues to have a difficult time with the recent passing of her husband.

## 2011-02-19 NOTE — Assessment & Plan Note (Signed)
Continue statin. Direct LDL will be at the recent labs

## 2011-02-19 NOTE — Assessment & Plan Note (Signed)
Patient will continue followup with pain management

## 2011-02-19 NOTE — Assessment & Plan Note (Signed)
Generalized nonspecific abdominal pain just over the same time as her start on antibiotics. Will add probiotics in the form of yogurt. Patient given red flags to seek care. Abdominal exam was benign today.

## 2011-02-19 NOTE — Assessment & Plan Note (Signed)
Will refer to urology for microscopic hematuria in setting of tobacco use.

## 2011-02-20 ENCOUNTER — Encounter (HOSPITAL_COMMUNITY): Payer: Self-pay | Admitting: Psychiatry

## 2011-02-20 ENCOUNTER — Ambulatory Visit (INDEPENDENT_AMBULATORY_CARE_PROVIDER_SITE_OTHER): Payer: Medicare Other | Admitting: Psychiatry

## 2011-02-20 DIAGNOSIS — F319 Bipolar disorder, unspecified: Secondary | ICD-10-CM

## 2011-02-20 NOTE — Progress Notes (Signed)
Patient:  Tonya Sullivan   DOB: February 19, 1955  MR Number: [id number]  [facility]  [address] [place] [place],  [place], [address]  Start: Tuesday 02/20/2011 1:10 PM End: Tuesday 02/20/2011 2:00 PM  Provider/Observer:     Florencia Reasons, MSW, LCSW   Chief Complaint:      Chief Complaint  Patient presents with  . Depression    Reason For Service:     The patient was referred for services by psychiatrist Dr. Lolly Mustache due to patient experiencing anxiety, stress, depression, and mood swings. She has a long-standing history of depression beginning in adolescence. Patient also has significant trauma history. Patient also is experiencing grief and loss issues due to to the death of her husband in October 28, 2010.  Interventions Strategy:  Supportive therapy, grief therapy  Participation Level:   Active  Participation Quality:  Appropriate      Behavioral Observation:  Fairly Groomed, Alert, and depressed  Current Psychosocial Factors: The patient continues to experience stress related to the recent death of her husband. She also recently had medical tests that indicated blood is in her urine. She is waiting for an appointment with a urologist. Patient is anxious as she has not had contact from son in a week.  Content of Session:   Reviewing symptoms, processing grief and loss   Current Status:   Patient experiences report increased energy, increased involvement in activities, increased social involvement, and increased energy. She experiences sleep difficulty and anxiety. She also experiences sadness re: husband.  Patient Progress:   Fair. Patient reports improved sleep pattern for the past week as she has taken trazodone. The patient reports feeling better since taking Wellbutrin as prescribed by Dr. Elvera Lennox. 15. She reports recently in jail going a picnic with her son and his family. However, she has not heard from him in a week and is beginning to worry. Patient also reports more  contact with her mother and increased contact with her neighbors. Patient reports recently seeing her doctor who informed her there is urine in her blood and is scheduling an appointment for a patient with a urologist. Patient reports she is concerned about the cause of this but is trying not to worry. Patient continues to experience grief and loss issues regarding her husband and reports having  recollections of his death as well as bad dreams. Patient's husband died at home suddenly and patient attempted to give her husband CPR. Patient reports having dreams about her husband's chest. Patient also reports having a recent conversation with her son regarding her husband's past temper. Patient reports experiencing guilt after the conversation as she blames herself for staying in a relationship that wasn't healthy for her son.  Target Goals:   1. Improving mood. 2. Improving coping skills  Last Reviewed:   07/12/09  Goals Addressed Today:    Improving coping skills, improving mood  Impression/Diagnosis:   The patient presents with a long-standing history of recurrent depression, mood swings, and a significant trauma history.  Diagnosis: Bipolar disorder  Diagnosis:  Axis I:  Bipolar Disorder           Axis II: Deferred

## 2011-02-20 NOTE — Patient Instructions (Signed)
Discussed orally 

## 2011-02-25 ENCOUNTER — Other Ambulatory Visit: Payer: Self-pay

## 2011-02-25 ENCOUNTER — Inpatient Hospital Stay (HOSPITAL_COMMUNITY)
Admission: EM | Admit: 2011-02-25 | Discharge: 2011-02-26 | DRG: 313 | Disposition: A | Discharge status: Home / Self Care | Payer: Medicare Other | Attending: Internal Medicine | Admitting: Internal Medicine

## 2011-02-25 ENCOUNTER — Emergency Department (HOSPITAL_COMMUNITY): Payer: Medicare Other

## 2011-02-25 ENCOUNTER — Encounter (HOSPITAL_COMMUNITY): Payer: Self-pay | Admitting: *Deleted

## 2011-02-25 DIAGNOSIS — F319 Bipolar disorder, unspecified: Secondary | ICD-10-CM | POA: Diagnosis present

## 2011-02-25 DIAGNOSIS — R0789 Other chest pain: Principal | ICD-10-CM | POA: Diagnosis present

## 2011-02-25 DIAGNOSIS — Z23 Encounter for immunization: Secondary | ICD-10-CM

## 2011-02-25 DIAGNOSIS — R079 Chest pain, unspecified: Secondary | ICD-10-CM | POA: Diagnosis present

## 2011-02-25 DIAGNOSIS — F32A Depression, unspecified: Secondary | ICD-10-CM | POA: Diagnosis present

## 2011-02-25 DIAGNOSIS — Z79899 Other long term (current) drug therapy: Secondary | ICD-10-CM

## 2011-02-25 DIAGNOSIS — I6529 Occlusion and stenosis of unspecified carotid artery: Secondary | ICD-10-CM | POA: Diagnosis present

## 2011-02-25 DIAGNOSIS — M549 Dorsalgia, unspecified: Secondary | ICD-10-CM | POA: Diagnosis present

## 2011-02-25 DIAGNOSIS — F17200 Nicotine dependence, unspecified, uncomplicated: Secondary | ICD-10-CM | POA: Diagnosis present

## 2011-02-25 DIAGNOSIS — J449 Chronic obstructive pulmonary disease, unspecified: Secondary | ICD-10-CM | POA: Diagnosis present

## 2011-02-25 DIAGNOSIS — Z7982 Long term (current) use of aspirin: Secondary | ICD-10-CM

## 2011-02-25 DIAGNOSIS — Z720 Tobacco use: Secondary | ICD-10-CM | POA: Diagnosis present

## 2011-02-25 DIAGNOSIS — F329 Major depressive disorder, single episode, unspecified: Secondary | ICD-10-CM

## 2011-02-25 DIAGNOSIS — J4489 Other specified chronic obstructive pulmonary disease: Secondary | ICD-10-CM | POA: Diagnosis present

## 2011-02-25 DIAGNOSIS — G8929 Other chronic pain: Secondary | ICD-10-CM | POA: Diagnosis present

## 2011-02-25 DIAGNOSIS — E785 Hyperlipidemia, unspecified: Secondary | ICD-10-CM | POA: Diagnosis present

## 2011-02-25 DIAGNOSIS — F411 Generalized anxiety disorder: Secondary | ICD-10-CM | POA: Diagnosis present

## 2011-02-25 DIAGNOSIS — E7800 Pure hypercholesterolemia, unspecified: Secondary | ICD-10-CM | POA: Diagnosis present

## 2011-02-25 DIAGNOSIS — Z888 Allergy status to other drugs, medicaments and biological substances status: Secondary | ICD-10-CM

## 2011-02-25 HISTORY — DX: Occlusion and stenosis of unspecified carotid artery: I65.29

## 2011-02-25 LAB — CARDIAC PANEL(CRET KIN+CKTOT+MB+TROPI)
CK, MB: 1.5 ng/mL (ref 0.3–4.0)
Relative Index: INVALID (ref 0.0–2.5)
Troponin I: 0.33 ng/mL (ref <0.30)

## 2011-02-25 LAB — DIFFERENTIAL
Basophils Absolute: 0 10*3/uL (ref 0.0–0.1)
Basophils Relative: 1 % (ref 0–1)
Eosinophils Relative: 2 % (ref 0–5)
Monocytes Absolute: 0.6 10*3/uL (ref 0.1–1.0)
Monocytes Relative: 9 % (ref 3–12)

## 2011-02-25 LAB — BASIC METABOLIC PANEL
BUN: 13 mg/dL (ref 6–23)
Calcium: 9.6 mg/dL (ref 8.4–10.5)
Creatinine, Ser: 0.89 mg/dL (ref 0.50–1.10)
GFR calc Af Amer: 83 mL/min — ABNORMAL LOW (ref >90)

## 2011-02-25 LAB — CBC
HCT: 45.6 % (ref 36.0–46.0)
MCH: 31.2 pg (ref 26.0–34.0)
MCHC: 33.8 g/dL (ref 30.0–36.0)
MCV: 92.3 fL (ref 78.0–100.0)
RDW: 12.9 % (ref 11.5–15.5)

## 2011-02-25 LAB — POCT I-STAT TROPONIN I: Troponin i, poc: 0 ng/mL (ref 0.00–0.08)

## 2011-02-25 MED ORDER — ARIPIPRAZOLE 10 MG PO TABS
10.0000 mg | ORAL_TABLET | Freq: Every day | ORAL | Status: DC
  Administered 2011-02-26: 10 mg via ORAL
  Filled 2011-02-25 (×3): qty 1

## 2011-02-25 MED ORDER — ACETAMINOPHEN 325 MG PO TABS
650.0000 mg | ORAL_TABLET | Freq: Four times a day (QID) | ORAL | Status: DC | PRN
  Administered 2011-02-26: 650 mg via ORAL
  Filled 2011-02-25: qty 2

## 2011-02-25 MED ORDER — ONDANSETRON HCL 4 MG/2ML IJ SOLN: 4.0000 mg | Freq: Three times a day (TID) | INTRAMUSCULAR | Status: DC | PRN

## 2011-02-25 MED ORDER — ONDANSETRON HCL 4 MG PO TABS: 4.0000 mg | ORAL_TABLET | Freq: Four times a day (QID) | ORAL | Status: DC | PRN

## 2011-02-25 MED ORDER — CITALOPRAM HYDROBROMIDE 20 MG PO TABS
40.0000 mg | ORAL_TABLET | Freq: Every day | ORAL | Status: DC
  Administered 2011-02-25 – 2011-02-26 (×2): 40 mg via ORAL
  Filled 2011-02-25 (×2): qty 2

## 2011-02-25 MED ORDER — BUPROPION HCL ER (SR) 150 MG PO TB12
150.0000 mg | ORAL_TABLET | Freq: Every day | ORAL | Status: DC
  Administered 2011-02-26: 150 mg via ORAL
  Filled 2011-02-25 (×3): qty 1

## 2011-02-25 MED ORDER — MORPHINE SULFATE 2 MG/ML IJ SOLN
2.0000 mg | INTRAMUSCULAR | Status: DC | PRN
  Administered 2011-02-26: 2 mg via INTRAVENOUS
  Filled 2011-02-25: qty 1

## 2011-02-25 MED ORDER — ALBUTEROL SULFATE HFA 108 (90 BASE) MCG/ACT IN AERS: 2.0000 | INHALATION_SPRAY | RESPIRATORY_TRACT | Status: DC | PRN

## 2011-02-25 MED ORDER — GUAIFENESIN ER 600 MG PO TB12
1200.0000 mg | ORAL_TABLET | Freq: Two times a day (BID) | ORAL | Status: DC
  Administered 2011-02-25 – 2011-02-26 (×2): 1200 mg via ORAL
  Filled 2011-02-25 (×2): qty 2

## 2011-02-25 MED ORDER — ACETAMINOPHEN 650 MG RE SUPP: 650.0000 mg | Freq: Four times a day (QID) | RECTAL | Status: DC | PRN

## 2011-02-25 MED ORDER — OXYCODONE-ACETAMINOPHEN 10-325 MG PO TABS: 1.0000 | ORAL_TABLET | Freq: Three times a day (TID) | ORAL | Status: DC | PRN

## 2011-02-25 MED ORDER — NICOTINE 21 MG/24HR TD PT24
21.0000 mg | MEDICATED_PATCH | TRANSDERMAL | Status: DC
  Administered 2011-02-25 – 2011-02-26 (×2): 21 mg via TRANSDERMAL
  Filled 2011-02-25 (×2): qty 1

## 2011-02-25 MED ORDER — SODIUM CHLORIDE 0.9 % IJ SOLN
3.0000 mL | Freq: Two times a day (BID) | INTRAMUSCULAR | Status: DC
  Administered 2011-02-25 – 2011-02-26 (×2): 3 mL via INTRAVENOUS
  Filled 2011-02-25 (×4): qty 3

## 2011-02-25 MED ORDER — OXYCODONE-ACETAMINOPHEN 5-325 MG PO TABS
1.0000 | ORAL_TABLET | Freq: Three times a day (TID) | ORAL | Status: DC | PRN
  Administered 2011-02-25 – 2011-02-26 (×2): 1 via ORAL
  Filled 2011-02-25 (×2): qty 1

## 2011-02-25 MED ORDER — SIMVASTATIN 20 MG PO TABS
20.0000 mg | ORAL_TABLET | Freq: Every day | ORAL | Status: DC
  Administered 2011-02-25 – 2011-02-26 (×2): 20 mg via ORAL
  Filled 2011-02-25 (×2): qty 1

## 2011-02-25 MED ORDER — MORPHINE SULFATE 2 MG/ML IJ SOLN
2.0000 mg | Freq: Once | INTRAMUSCULAR | Status: AC
  Administered 2011-02-25: 2 mg via INTRAVENOUS
  Filled 2011-02-25: qty 1

## 2011-02-25 MED ORDER — ASPIRIN EC 325 MG PO TBEC
325.0000 mg | DELAYED_RELEASE_TABLET | Freq: Every day | ORAL | Status: DC
  Administered 2011-02-25 – 2011-02-26 (×2): 325 mg via ORAL
  Filled 2011-02-25 (×2): qty 1

## 2011-02-25 MED ORDER — ONDANSETRON HCL 4 MG/2ML IJ SOLN: 4.0000 mg | Freq: Four times a day (QID) | INTRAMUSCULAR | Status: DC | PRN

## 2011-02-25 MED ORDER — ENOXAPARIN SODIUM 40 MG/0.4ML ~~LOC~~ SOLN
40.0000 mg | SUBCUTANEOUS | Status: DC
  Administered 2011-02-26: 40 mg via SUBCUTANEOUS
  Filled 2011-02-25: qty 0.4

## 2011-02-25 MED ORDER — SODIUM CHLORIDE 0.9 % IV SOLN
INTRAVENOUS | Status: DC
  Administered 2011-02-25 – 2011-02-26 (×2): via INTRAVENOUS

## 2011-02-25 MED ORDER — CLONAZEPAM 0.5 MG PO TABS: 0.5000 mg | ORAL_TABLET | Freq: Two times a day (BID) | ORAL | Status: DC | PRN

## 2011-02-25 MED ORDER — ASPIRIN 81 MG PO CHEW
324.0000 mg | CHEWABLE_TABLET | Freq: Once | ORAL | Status: AC
  Administered 2011-02-25: 324 mg via ORAL
  Filled 2011-02-25: qty 4

## 2011-02-25 MED ORDER — OXYCODONE HCL 5 MG PO TABS: 5.0000 mg | ORAL_TABLET | Freq: Three times a day (TID) | ORAL | Status: DC | PRN

## 2011-02-25 NOTE — ED Provider Notes (Signed)
History     CSN: [id number]  Arrival date & time 02/25/11  1146   First MD Initiated Contact with Patient 02/25/11 1209      Chief Complaint  Patient presents with  . Chest Pain    (Consider location/radiation/quality/duration/timing/severity/associated sxs/prior treatment) Patient is a 56 y.o. female presenting with chest pain. The history is provided by the patient.  Chest Pain The chest pain began yesterday. Chest pain occurs constantly. The chest pain is unchanged. At its most intense, the pain is at 7/10. The pain is currently at 7/10. The severity of the pain is moderate. Quality: "Cold sensation". The pain radiates to the left shoulder and left arm (She describes intermittent sharp and needles sensation radiating through her left shoulder into her left elbow area.  This is intermittent.  Not reproducible.). Primary symptoms include shortness of breath and nausea. Pertinent negatives for primary symptoms include no fever, no wheezing, no palpitations, no abdominal pain and no dizziness.  Pertinent negatives for associated symptoms include no claudication, no diaphoresis, no numbness, no orthopnea and no weakness. Associated symptoms comments: She does report a mild nausea which started 2 evenings ago and has resolved prior to arrival today.  She also describes shortness of breath with exertion only since yesterday which resolves at rest.. Risk factors include smoking/tobacco exposure (She reports history of peripheral vascular disease, stating she has 40% blockages in her bilateral carotids, based on this study in her physician's office several years ago.  She also is treated for hypercholesterolemia.).  Pertinent negatives for past medical history include no CAD, no CHF and no PE.  Her family medical history is significant for heart disease in family.  Pertinent negatives for family medical history include: no early MI in family. Procedure history comments: She last had an exercise  treadmill test approximately 15 years ago..     Past Medical History  Diagnosis Date  . COPD (chronic obstructive pulmonary disease)   . Depression   . Chronic back pain   . Hyperlipidemia   . Anxiety   . Bipolar disorder, unspecified   . Carotid artery calcification     Past Surgical History  Procedure Date  . Appendectomy   . Tonsillectomy   . Cholecystectomy   . Back surgery     x 5  . Lump left breast     removed- benign  . Abdominal hysterectomy     tubal pregnancy  . Inguinal hernia repair   . Stomach surgery     ?holes in esophgous    Family History  Problem Relation Age of Onset  . Hypertension Mother   . Heart disease Mother   . Cancer Father     lung cancer  . Hypertension Brother   . Alcohol abuse Brother   . Bipolar disorder Brother   . Alcohol abuse Sister   . Bipolar disorder Sister   . Alcohol abuse Brother   . Bipolar disorder Brother   . Alcohol abuse Sister   . Bipolar disorder Sister   . Alcohol abuse Brother   . Bipolar disorder Brother   . Bipolar disorder Brother   . Bipolar disorder Maternal Aunt   . Bipolar disorder Paternal Aunt   . Bipolar disorder Maternal Uncle   . Bipolar disorder Paternal Uncle   . Bipolar disorder Maternal Grandfather   . Alcohol abuse Maternal Grandfather   . Bipolar disorder Maternal Grandmother   . Alcohol abuse Maternal Grandmother   . Bipolar disorder Paternal Grandfather   .  Alcohol abuse Paternal Grandfather   . Bipolar disorder Paternal Grandmother   . Alcohol abuse Paternal Grandmother   . Bipolar disorder Maternal Uncle     History  Substance Use Topics  . Smoking status: Current Everyday Smoker -- 1.0 packs/day    Types: Cigarettes  . Smokeless tobacco: Never Used  . Alcohol Use: No    OB History    Grav Para Term Preterm Abortions TAB SAB Ect Mult Living                  Review of Systems  Constitutional: Negative for fever and diaphoresis.  HENT: Negative for congestion, sore  throat and neck pain.   Eyes: Negative.   Respiratory: Positive for shortness of breath. Negative for chest tightness and wheezing.   Cardiovascular: Positive for chest pain. Negative for palpitations, orthopnea, claudication and leg swelling.  Gastrointestinal: Positive for nausea. Negative for abdominal pain.  Genitourinary: Negative.   Musculoskeletal: Negative for joint swelling and arthralgias.  Skin: Negative.  Negative for rash and wound.  Neurological: Negative for dizziness, weakness, light-headedness, numbness and headaches.  Hematological: Negative.   Psychiatric/Behavioral: Negative.     Allergies  Codeine; Cyclobenzaprine; Darvocet; Adhesive; Iodine; Levaquin; and Prednisone  Home Medications   Current Outpatient Rx  Name Route Sig Dispense Refill  . ALBUTEROL SULFATE HFA 108 (90 BASE) MCG/ACT IN AERS Inhalation Inhale 2 puffs into the lungs every 4 (four) hours as needed. For shortness of breath    . ARIPIPRAZOLE 10 MG PO TABS Oral Take 1 tablet (10 mg total) by mouth daily. 30 tablet 0  . BUPROPION HCL ER (SR) 150 MG PO TB12 Oral Take 1 tablet (150 mg total) by mouth every morning. 30 tablet 0  . CITALOPRAM HYDROBROMIDE 40 MG PO TABS Oral Take 1 tablet (40 mg total) by mouth daily. 30 tablet 0  . CLONAZEPAM 0.5 MG PO TABS Oral Take 1 tablet (0.5 mg total) by mouth as needed for anxiety. 30 tablet 0  . OXYCODONE-ACETAMINOPHEN 10-325 MG PO TABS Oral Take 1 tablet by mouth every 8 (eight) hours as needed. For pain    . PRAVASTATIN SODIUM 40 MG PO TABS Oral Take 1 tablet (40 mg total) by mouth at bedtime. 30 tablet 6  . PRESCRIPTION MEDICATION Oral Take 1 capsule by mouth daily. Patient believe its lyrica dose pack, received sample verify with doctor doonquah      BP 147/92  Pulse 81  Temp(Src) 98.4 F (36.9 C) (Oral)  Resp 18  Ht 5\' 7"  (1.702 m)  Wt 210 lb (95.255 kg)  BMI 32.89 kg/m2  SpO2 96%  Physical Exam  Nursing note and vitals reviewed. Constitutional: She  is oriented to person, place, and time. She appears well-developed and well-nourished.       Obese  HENT:  Head: Normocephalic and atraumatic.  Eyes: Conjunctivae are normal.  Neck: Normal range of motion.  Cardiovascular: Normal rate, regular rhythm, normal heart sounds and intact distal pulses.   Pulmonary/Chest: Effort normal and breath sounds normal. She has no wheezes.  Abdominal: Soft. Bowel sounds are normal. There is no tenderness.  Musculoskeletal: Normal range of motion.  Neurological: She is alert and oriented to person, place, and time.  Skin: Skin is warm and dry.  Psychiatric: She has a normal mood and affect.    ED Course  Procedures (including critical care time)  Labs Reviewed  CBC - Abnormal; Notable for the following:    Hemoglobin 15.4 (*)  All other components within normal limits  BASIC METABOLIC PANEL - Abnormal; Notable for the following:    Glucose, Bld 100 (*)    GFR calc non Af Amer 72 (*)    GFR calc Af Amer 83 (*)    All other components within normal limits  DIFFERENTIAL  POCT I-STAT TROPONIN I   Dg Chest Portable 1 View  02/25/2011  *RADIOLOGY REPORT*  Clinical Data: Chest pain  PORTABLE CHEST - 1 VIEW  Comparison: 03/12/2010  Findings: Normal heart size.  Mild left basilar atelectasis.  No pneumothorax.  Normal pulmonary vascularity.  Bronchitic changes.  IMPRESSION: Bronchitic changes.  Mild left basilar atelectasis.  Original Report Authenticated By: Donavan Burnet, M.D.     1. Chest pain       MDM  Case discussed with Dr. Judd Lien who agrees patient has significant risk factors and should be admitted for rule out of atypical cardiac source of symptoms.  Spoke with Dr. Kerry Hough with [facility] who will admit patient.      Date: 02/25/2011  Rate: 82  Rhythm: normal sinus rhythm  QRS Axis: normal  Intervals: normal  ST/T Wave abnormalities: normal  Conduction Disutrbances:none  Narrative Interpretation:   Old EKG Reviewed: none  available    Candis Musa, PA 02/25/11 1505  Candis Musa, PA 02/25/11 1506

## 2011-02-25 NOTE — ED Notes (Addendum)
Pt c/o left side cp starting last night. Pt pt states pain radiating down left arm.

## 2011-02-25 NOTE — Progress Notes (Signed)
PCP:   Milinda Antis, MD, MD   Chief Complaint:  Chest pain  HPI: This is a 56 y/o female with past medical history of tobacco abuse, hyperlipidemia, chronic back pain and bipolar/depression.  Patient presents with complaints of chest pain. She reports his pain as a sharp pain occurring in her left chest radiating to her left arm. This began last night at rest. She reports it has been occurring intermittently lasting 15-30 seconds. It is not affected by position, food intake. It is not related to exertion, patient reports that she's been climbing up the stairs without any worsening of her symptoms. She did have some nausea last night but this is resolved. She has not had any vomiting. She denies any fever. She has a chronic cough which has slightly become more productive. She feels chronically short of breath on exertion and feels this may be related to COPD.  Allergies:   Allergies  Allergen Reactions  . Codeine Anaphylaxis  . Cyclobenzaprine Anaphylaxis  . Darvocet (Propoxyphene N-Acetaminophen) Anaphylaxis  . Adhesive (Tape) Hives  . Iodine Hives  . Levaquin Hives  . Prednisone Hives      Past Medical History  Diagnosis Date  . COPD (chronic obstructive pulmonary disease)   . Depression   . Chronic back pain   . Hyperlipidemia   . Anxiety   . Bipolar disorder, unspecified   . Carotid artery calcification     Past Surgical History  Procedure Date  . Appendectomy   . Tonsillectomy   . Cholecystectomy   . Back surgery     x 5  . Lump left breast     removed- benign  . Abdominal hysterectomy     tubal pregnancy  . Inguinal hernia repair   . Stomach surgery     ?holes in esophgous    Prior to Admission medications   Medication Sig Start Date End Date Taking? Authorizing Provider  albuterol (PROVENTIL HFA;VENTOLIN HFA) 108 (90 BASE) MCG/ACT inhaler Inhale 2 puffs into the lungs every 4 (four) hours as needed. For shortness of breath   Yes Historical Provider, MD    ARIPiprazole (ABILIFY) 10 MG tablet Take 1 tablet (10 mg total) by mouth daily. 02/08/11  Yes Syed T. Arfeen, MD  buPROPion (WELLBUTRIN SR) 150 MG 12 hr tablet Take 1 tablet (150 mg total) by mouth every morning. 02/08/11 02/08/12 Yes Syed T. Arfeen, MD  citalopram (CELEXA) 40 MG tablet Take 1 tablet (40 mg total) by mouth daily. 02/08/11  Yes Syed T. Arfeen, MD  clonazePAM (KLONOPIN) 0.5 MG tablet Take 1 tablet (0.5 mg total) by mouth as needed for anxiety. 02/08/11  Yes Syed T. Arfeen, MD  oxyCODONE-acetaminophen (PERCOCET) 10-325 MG per tablet Take 1 tablet by mouth every 8 (eight) hours as needed. For pain   Yes Historical Provider, MD  pravastatin (PRAVACHOL) 40 MG tablet Take 1 tablet (40 mg total) by mouth at bedtime. 09/25/10  Yes Milinda Antis, MD  PRESCRIPTION MEDICATION Take 1 capsule by mouth daily. Patient believe its lyrica dose pack, received sample verify with doctor [place]   Yes Historical Provider, MD    Social History:  reports that she has been smoking Cigarettes.  She has been smoking about 1 pack per day. She has never used smokeless tobacco. She reports that she does not drink alcohol or use illicit drugs.  Family History  Problem Relation Age of Onset  . Hypertension Mother   . Heart disease Mother   . Cancer Father  lung cancer  . Hypertension Brother   . Alcohol abuse Brother   . Bipolar disorder Brother   . Alcohol abuse Sister   . Bipolar disorder Sister   . Alcohol abuse Brother   . Bipolar disorder Brother   . Alcohol abuse Sister   . Bipolar disorder Sister   . Alcohol abuse Brother   . Bipolar disorder Brother   . Bipolar disorder Brother   . Bipolar disorder Maternal Aunt   . Bipolar disorder Paternal Aunt   . Bipolar disorder Maternal Uncle   . Bipolar disorder Paternal Uncle   . Bipolar disorder Maternal Grandfather   . Alcohol abuse Maternal Grandfather   . Bipolar disorder Maternal Grandmother   . Alcohol abuse Maternal Grandmother   . Bipolar  disorder Paternal Grandfather   . Alcohol abuse Paternal Grandfather   . Bipolar disorder Paternal Grandmother   . Alcohol abuse Paternal Grandmother   . Bipolar disorder Maternal Uncle     Review of Systems: Positives in bold Constitutional: Denies fever, chills, diaphoresis, appetite change and fatigue.  HEENT: Denies photophobia, eye pain, redness, hearing loss, ear pain, congestion, sore throat, rhinorrhea, sneezing, mouth sores, trouble swallowing, neck pain, neck stiffness and tinnitus.   Respiratory: Denies SOB, DOE, cough, chest tightness,  and wheezing.   Cardiovascular: Denies chest pain, palpitations and leg swelling.  Gastrointestinal: Denies nausea, vomiting, abdominal pain, diarrhea, constipation, blood in stool and abdominal distention.  Genitourinary: Denies dysuria, urgency, frequency, hematuria, flank pain and difficulty urinating.  Musculoskeletal: Denies myalgias, back pain, joint swelling, arthralgias and gait problem.  Skin: Denies pallor, rash and wound.  Neurological: Denies dizziness, seizures, syncope, weakness, light-headedness, numbness and headaches.  Hematological: Denies adenopathy. Easy bruising, personal or family bleeding history  Psychiatric/Behavioral: Denies suicidal ideation, mood changes, confusion, nervousness, sleep disturbance and agitation   Physical Exam: Blood pressure 115/76, pulse 70, temperature 97.9 F (36.6 C), temperature source Oral, resp. rate 18, height 5\' 7"  (1.702 m), weight 95.255 kg (210 lb), SpO2 95.00%. General: Patient in no acute distress, lying in bed, eating dinner HEENT: Normocephalic, atraumatic, pupils are equal and react to light, mucous membranes are moist Neck: Supple Chest: Clear to auscultation bilaterally, pain is not reproducible on palpation Cardiac: S1, S2, regular rate and rhythm Abdomen: Soft, nontender, nondistended, bowel sounds are active Extremities: No cyanosis, clubbing, or edema Neurologic: No focal  deficits, grossly intact Skin: Warm, intact, no rashes.  Labs on Admission:  Results for orders placed during the hospital encounter of 02/25/11 (from the past 48 hour(s))  CBC     Status: Abnormal   Collection Time   02/25/11 12:04 PM      Component Value Range Comment   WBC 6.8  4.0 - 10.5 (K/uL)    RBC 4.94  3.87 - 5.11 (MIL/uL)    Hemoglobin 15.4 (*) 12.0 - 15.0 (g/dL)    HCT 16.1  09.6 - 04.5 (%)    MCV 92.3  78.0 - 100.0 (fL)    MCH 31.2  26.0 - 34.0 (pg)    MCHC 33.8  30.0 - 36.0 (g/dL)    RDW 40.9  81.1 - 91.4 (%)    Platelets 226  150 - 400 (K/uL)   DIFFERENTIAL     Status: Normal   Collection Time   02/25/11 12:04 PM      Component Value Range Comment   Neutrophils Relative 55  43 - 77 (%)    Neutro Abs 3.7  1.7 - 7.7 (K/uL)  Lymphocytes Relative 33  12 - 46 (%)    Lymphs Abs 2.2  0.7 - 4.0 (K/uL)    Monocytes Relative 9  3 - 12 (%)    Monocytes Absolute 0.6  0.1 - 1.0 (K/uL)    Eosinophils Relative 2  0 - 5 (%)    Eosinophils Absolute 0.1  0.0 - 0.7 (K/uL)    Basophils Relative 1  0 - 1 (%)    Basophils Absolute 0.0  0.0 - 0.1 (K/uL)   BASIC METABOLIC PANEL     Status: Abnormal   Collection Time   02/25/11 12:04 PM      Component Value Range Comment   Sodium 138  135 - 145 (mEq/L)    Potassium 4.3  3.5 - 5.1 (mEq/L)    Chloride 102  96 - 112 (mEq/L)    CO2 27  19 - 32 (mEq/L)    Glucose, Bld 100 (*) 70 - 99 (mg/dL)    BUN 13  6 - 23 (mg/dL)    Creatinine, Ser 9.81  0.50 - 1.10 (mg/dL)    Calcium 9.6  8.4 - 10.5 (mg/dL)    GFR calc non Af Amer 72 (*) >90 (mL/min)    GFR calc Af Amer 83 (*) >90 (mL/min)   POCT I-STAT TROPONIN I     Status: Normal   Collection Time   02/25/11 12:57 PM      Component Value Range Comment   Troponin i, poc 0.00  0.00 - 0.08 (ng/mL)    Comment 3              Radiological Exams on Admission: Dg Chest Portable 1 View  02/25/2011  *RADIOLOGY REPORT*  Clinical Data: Chest pain  PORTABLE CHEST - 1 VIEW  Comparison: 03/12/2010   Findings: Normal heart size.  Mild left basilar atelectasis.  No pneumothorax.  Normal pulmonary vascularity.  Bronchitic changes.  IMPRESSION: Bronchitic changes.  Mild left basilar atelectasis.  Original Report Authenticated By: Donavan Burnet, M.D.    Assessment/Plan Active Problems:  Hyperlipidemia  Depression  COPD (chronic obstructive pulmonary disease)  Tobacco use  Chest pain  Plan: The patient's pain does appear somewhat atypical. She does report a history of this approximately 15 years or more had hospital which was reportedly normal. She'll be admitted to telemetry bed. We'll cycle her cardiac markers to rule out acute myocardial infarction. We will also check a 2-D echocardiogram. If her workup is negative she can likely be discharged with plans for an outpatient stress test. We will provide nicotine patch for her tobacco abuse, she has been Counseled on the importance of cigarette cessation. Will continue her outpatient medications as well as supplement with an aspirin. Her pain may also related to a bronchitis. We will provide mucolytic and albuterol inhaler.  Time Spent on Admission:  Eriberto Felch [facility] Pager: [phone] 02/25/2011, 5:22 PM

## 2011-02-25 NOTE — Progress Notes (Signed)
CRITICAL VALUE ALERT  Critical value received:  Troponin I 0.33  Date of notification:  02/25/2011 Time of notification: 1905  Critical value read back:yes  Nurse who received alert:  Idelle Leech  MD notified (1st page):  Memon  Time of first page:  1920  MD notified (2nd page):  Time of second page:  Responding MD:  [facility]  Time MD responded:  27  Md would like to continue to monitor.  Cardiology consult ordered for 2./25/13.

## 2011-02-26 ENCOUNTER — Other Ambulatory Visit: Payer: Self-pay

## 2011-02-26 ENCOUNTER — Encounter (HOSPITAL_COMMUNITY): Payer: Self-pay | Admitting: Adult Health

## 2011-02-26 DIAGNOSIS — I517 Cardiomegaly: Secondary | ICD-10-CM

## 2011-02-26 DIAGNOSIS — R079 Chest pain, unspecified: Secondary | ICD-10-CM

## 2011-02-26 LAB — LIPID PANEL
Cholesterol: 152 mg/dL (ref 0–200)
Total CHOL/HDL Ratio: 3.2 RATIO
VLDL: 36 mg/dL (ref 0–40)

## 2011-02-26 LAB — CARDIAC PANEL(CRET KIN+CKTOT+MB+TROPI)
CK, MB: 1.7 ng/mL (ref 0.3–4.0)
CK, MB: 2.1 ng/mL (ref 0.3–4.0)
Relative Index: INVALID (ref 0.0–2.5)
Total CK: 55 U/L (ref 7–177)
Total CK: 58 U/L (ref 7–177)
Troponin I: 0.33 ng/mL (ref <0.30)

## 2011-02-26 LAB — COMPREHENSIVE METABOLIC PANEL
Albumin: 3.2 g/dL — ABNORMAL LOW (ref 3.5–5.2)
Alkaline Phosphatase: 65 U/L (ref 39–117)
BUN: 15 mg/dL (ref 6–23)
Creatinine, Ser: 1.08 mg/dL (ref 0.50–1.10)
GFR calc Af Amer: 66 mL/min — ABNORMAL LOW (ref >90)
Glucose, Bld: 95 mg/dL (ref 70–99)
Potassium: 5.2 mEq/L — ABNORMAL HIGH (ref 3.5–5.1)
Total Protein: 6.4 g/dL (ref 6.0–8.3)

## 2011-02-26 MED ORDER — ASPIRIN 81 MG PO TBEC: 81.0000 mg | DELAYED_RELEASE_TABLET | Freq: Every day | ORAL | Status: AC

## 2011-02-26 NOTE — Progress Notes (Signed)
*  PRELIMINARY RESULTS* Echocardiogram 2D Echocardiogram has been performed.  Tonya Sullivan 02/26/2011, 10:15 AM

## 2011-02-26 NOTE — Progress Notes (Signed)
Patient discharged home via family; Pt given and explained discharge instructions, prescriptions, carenotes; stated understanding and denied questions; pts IV removed without problems; pt stable at time of discharge   

## 2011-02-26 NOTE — Discharge Instructions (Signed)
Chest Pain (Nonspecific) It is often hard to give a specific diagnosis for the cause of chest pain. There is always a chance that your pain could be related to something serious, such as a heart attack or a blood clot in the lungs. You need to follow up with your caregiver for further evaluation. CAUSES   Heartburn.   Pneumonia or bronchitis.   Anxiety and stress.   Inflammation around your heart (pericarditis) or lung (pleuritis or pleurisy).   A blood clot in the lung.   A collapsed lung (pneumothorax). It can develop suddenly on its own (spontaneous pneumothorax) or from injury (trauma) to the chest.  The chest wall is composed of bones, muscles, and cartilage. Any of these can be the source of the pain.  The bones can be bruised by injury.   The muscles or cartilage can be strained by coughing or overwork.   The cartilage can be affected by inflammation and become sore (costochondritis).  DIAGNOSIS  Lab tests or other studies, such as X-rays, an EKG, stress testing, or cardiac imaging, may be needed to find the cause of your pain.  TREATMENT   Treatment depends on what may be causing your chest pain. Treatment may include:   Acid blockers for heartburn.   Anti-inflammatory medicine.   Pain medicine for inflammatory conditions.   Antibiotics if an infection is present.   You may be advised to change lifestyle habits. This includes stopping smoking and avoiding caffeine and chocolate.   You may be advised to keep your head raised (elevated) when sleeping. This reduces the chance of acid going backward from your stomach into your esophagus.   Most of the time, nonspecific chest pain will improve within 2 to 3 days with rest and mild pain medicine.  HOME CARE INSTRUCTIONS   If antibiotics were prescribed, take the full amount even if you start to feel better.   For the next few days, avoid physical activities that bring on chest pain. Continue physical activities as  directed.   Do not smoke cigarettes or drink alcohol until your symptoms are gone.   Only take over-the-counter or prescription medicine for pain, discomfort, or fever as directed by your caregiver.   Follow your caregiver's suggestions for further testing if your chest pain does not go away.   Keep any follow-up appointments you made. If you do not go to an appointment, you could develop lasting (chronic) problems with pain. If there is any problem keeping an appointment, you must call to reschedule.  SEEK MEDICAL CARE IF:   You think you are having problems from the medicine you are taking. Read your medicine instructions carefully.   Your chest pain does not go away, even after treatment.   You develop a rash with blisters on your chest.  SEEK IMMEDIATE MEDICAL CARE IF:   You have increased chest pain or pain that spreads to your arm, neck, jaw, back, or belly (abdomen).   You develop shortness of breath, an increasing cough, or you are coughing up blood.   You have severe back or abdominal pain, feel sick to your stomach (nauseous) or throw up (vomit).   You develop severe weakness, fainting, or chills.   You have an oral temperature above 102 F (38.9 C), not controlled by medicine.  THIS IS AN EMERGENCY. Do not wait to see if the pain will go away. Get medical help at once. Call your local emergency services (911 in [place] Do not drive yourself to   the hospital. MAKE SURE YOU:   Understand these instructions.   Will watch your condition.   Will get help right away if you are not doing well or get worse.  Document Released: 09/27/2004 Document Revised: 08/30/2010 Document Reviewed: 07/24/2007 [employer] Patient Information 2012 [employer]. 

## 2011-02-26 NOTE — Discharge Summary (Signed)
Physician Discharge Summary  Patient ID: Tonya Sullivan MRN: [id number] DOB/AGE: 05-Dec-1955 56 y.o.  Admit date: 02/25/2011 Discharge date: 02/26/2011  Primary Care Physician:  Milinda Antis, MD, MD   Discharge Diagnoses:    Active Problems:  Hyperlipidemia  Depression  COPD (chronic obstructive pulmonary disease)  Tobacco use  Chest pain, possibly musculoskeletal    Medication List  As of 02/26/2011  7:40 PM   TAKE these medications         albuterol 108 (90 BASE) MCG/ACT inhaler   Commonly known as: PROVENTIL HFA;VENTOLIN HFA   Inhale 2 puffs into the lungs every 4 (four) hours as needed. For shortness of breath      ARIPiprazole 10 MG tablet   Commonly known as: ABILIFY   Take 1 tablet (10 mg total) by mouth daily.      aspirin 81 MG EC tablet   Take 1 tablet (81 mg total) by mouth daily. Swallow whole.      buPROPion 150 MG 12 hr tablet   Commonly known as: WELLBUTRIN SR   Take 1 tablet (150 mg total) by mouth every morning.      citalopram 40 MG tablet   Commonly known as: CELEXA   Take 1 tablet (40 mg total) by mouth daily.      clonazePAM 0.5 MG tablet   Commonly known as: KLONOPIN   Take 1 tablet (0.5 mg total) by mouth as needed for anxiety.      GRALISE STARTER 300 & 600 MG Misc   Generic drug: Gabapentin (PHN)   Take 300-1,800 mg by mouth as directed. 15 DAY Starter pack doses     DAY 1- 300 mg     DAY 2- 600 mg     DAY 3 to 6 - 900 mg    DAY 7 to 10 - 1200 mg      DAY 11 to 14 - 1500 mg      DAY 15 - 1800 mg        oxyCODONE-acetaminophen 10-325 MG per tablet   Commonly known as: PERCOCET   Take 1 tablet by mouth every 8 (eight) hours as needed. For pain      pravastatin 40 MG tablet   Commonly known as: PRAVACHOL   Take 1 tablet (40 mg total) by mouth at bedtime.           Discharge Exam: Pain resolved, no complaints, wants to go home Blood pressure 138/88, pulse 65, temperature 97.6 F (36.4 C), temperature source Oral, resp. rate 20,  height 5\' 7"  (1.702 m), weight 95.255 kg (210 lb), SpO2 95.00%. NAD CTA B S1, S2, RRR Soft, BS+ No edema b/l  Disposition and Follow-up:  Patient will follow up with cardiology as an outpatient for stress test Follow up with primary doctor in 2 weeks  Consults:  Cardiology, Dr. Dietrich Pates   Significant Diagnostic Studies:  Dg Chest Portable 1 View  02/25/2011  *RADIOLOGY REPORT*  Clinical Data: Chest pain  PORTABLE CHEST - 1 VIEW  Comparison: 03/12/2010  Findings: Normal heart size.  Mild left basilar atelectasis.  No pneumothorax.  Normal pulmonary vascularity.  Bronchitic changes.  IMPRESSION: Bronchitic changes.  Mild left basilar atelectasis.  Original Report Authenticated By: Donavan Burnet, M.D.    Brief H and P: For complete details please refer to admission H and P, but in brief This is a 56 y/o female with past medical history of tobacco abuse, hyperlipidemia, chronic back pain and bipolar/depression. Patient presents  with complaints of chest pain. She reports his pain as a sharp pain occurring in her left chest radiating to her left arm. This began last night at rest. She reports it has been occurring intermittently lasting 15-30 seconds. It is not affected by position, food intake. It is not related to exertion, patient reports that she's been climbing up the stairs without any worsening of her symptoms. She did have some nausea last night but this is resolved. She has not had any vomiting. She denies any fever. She has a chronic cough which has slightly become more productive. She feels chronically short of breath on exertion and feels this may be related to COPD.     Hospital Course:  Patient was admitted to the hospital for chest pain. Her EKG was negative for acute findings.  Her chest pain resolved after admission.  2D echo was also done which did not show any wall motion abnormalities, and did show a preserved EF.  Patient did have a slight bump in her troponins to 0.33.  Her  CK was found to be normal.  She was seen in consultation by [facility] cardiology. It was felt that patient would ultimately need a stress test for further evaluation.  Patient was requesting to discharge home and pursue this as an outpatient.  She is felt stable to discharge at this time and follow up with cardiology as an outpatient for stress testing.  She will be continued on aspirin, statin and her antihypertensives.  She was advised to quit smoking.  Time spent on Discharge:  Signed: Damaria Vachon [facility] Pager: [phone] 02/26/2011, 7:40 PM

## 2011-02-26 NOTE — ED Provider Notes (Signed)
Medical screening examination/treatment/procedure(s) were performed by non-physician practitioner and as supervising physician I was immediately available for consultation/collaboration.   Laporche Martelle, MD 02/26/11 0804 

## 2011-02-26 NOTE — Consult Note (Signed)
CARDIOLOGY CONSULT NOTE  Patient ID: ARLETHIA BASSO MRN: [id number] DOB/AGE: [birth year] 56 y.o.  Admit date: 02/25/2011 Referring Physician: PTH Primary Henri Medal, MD, MD Primary Cardiologist: (New) Reason for Consultation: Chest Pain Active Problems:  Hyperlipidemia  Depression  COPD (chronic obstructive pulmonary disease)  Tobacco use  Chest pain  HPI: Ms. Marter is a 56 year old female patient with no prior cardiac history but multiple cardiovascular risk factors, who presented to the emergency room with what she described as a cold pressure in the center of her chest radiating to the left chest. There were intermittent  episodes of sharp pain as well. It was not associated with diaphoresis, dizziness, or shortness of breath, but she did experience nausea and episodes of emesis. She states that it started late Saturday night, keeping her awake and causing her concern by the following day. She presented to the emergency room with these symptoms and was admitted to rule out myocardial infarction. Blood pressure on admission was 147/92 with a heart rate of 81. She was treated with chewable aspirin. Troponin are mildly positive at 0.33 x2 sets with negative CK and CK-MB. His factors include hyperlipidemia, tobacco abuse, peripheral vascular disease and strong family history of both parents with CAD. He continues to have some vague chest pressure, which comes and goes, is also reproducible with palpation of the left chest.  Review of systems complete and found to be negative unless listed above   Past Medical History  Diagnosis Date  . COPD (chronic obstructive pulmonary disease)   . Depression   . Chronic back pain   . Hyperlipidemia   . Anxiety   . Bipolar disorder, unspecified   . Carotid artery calcification     Family History  Problem Relation Age of Onset  . Hypertension Mother   . Heart disease Mother   . Cancer Father     lung cancer  . Hypertension Brother   .  Alcohol abuse Brother   . Bipolar disorder Brother   . Alcohol abuse Sister   . Bipolar disorder Sister   . Alcohol abuse Brother   . Bipolar disorder Brother   . Alcohol abuse Sister   . Bipolar disorder Sister   . Alcohol abuse Brother   . Bipolar disorder Brother   . Bipolar disorder Brother   . Bipolar disorder Maternal Aunt   . Bipolar disorder Paternal Aunt   . Bipolar disorder Maternal Uncle   . Bipolar disorder Paternal Uncle   . Bipolar disorder Maternal Grandfather   . Alcohol abuse Maternal Grandfather   . Bipolar disorder Maternal Grandmother   . Alcohol abuse Maternal Grandmother   . Bipolar disorder Paternal Grandfather   . Alcohol abuse Paternal Grandfather   . Bipolar disorder Paternal Grandmother   . Alcohol abuse Paternal Grandmother   . Bipolar disorder Maternal Uncle   . Heart attack Mother   . Heart attack Father     History   Social History  . Marital Status: Legally Separated    Spouse Name: N/A    Number of Children: N/A  . Years of Education: N/A   Occupational History  . Diabled    Social History Main Topics  . Smoking status: Current Everyday Smoker -- 1.0 packs/day    Types: Cigarettes  . Smokeless tobacco: Never Used  . Alcohol Use: No  . Drug Use: No  . Sexually Active: No   Other Topics Concern  . Not on file   Social History Narrative   Widow.  Lives in [place] alone.    Past Surgical History  Procedure Date  . Appendectomy   . Tonsillectomy   . Cholecystectomy   . Back surgery     x 5  . Lump left breast     removed- benign  . Abdominal hysterectomy     tubal pregnancy  . Inguinal hernia repair   . Stomach surgery     ?holes in esophgous     Prescriptions prior to admission  Medication Sig Dispense Refill  . albuterol (PROVENTIL HFA;VENTOLIN HFA) 108 (90 BASE) MCG/ACT inhaler Inhale 2 puffs into the lungs every 4 (four) hours as needed. For shortness of breath      . ARIPiprazole (ABILIFY) 10 MG tablet Take 1  tablet (10 mg total) by mouth daily.  30 tablet  0  . buPROPion (WELLBUTRIN SR) 150 MG 12 hr tablet Take 1 tablet (150 mg total) by mouth every morning.  30 tablet  0  . citalopram (CELEXA) 40 MG tablet Take 1 tablet (40 mg total) by mouth daily.  30 tablet  0  . clonazePAM (KLONOPIN) 0.5 MG tablet Take 1 tablet (0.5 mg total) by mouth as needed for anxiety.  30 tablet  0  . oxyCODONE-acetaminophen (PERCOCET) 10-325 MG per tablet Take 1 tablet by mouth every 8 (eight) hours as needed. For pain      . pravastatin (PRAVACHOL) 40 MG tablet Take 1 tablet (40 mg total) by mouth at bedtime.  30 tablet  6  . PRESCRIPTION MEDICATION Take 1 capsule by mouth daily. Patient believe its lyrica dose pack, received sample verify with doctor [place]      Physical Exam: Blood pressure 100/66, pulse 58, temperature 97.6 F (36.4 C), temperature source Oral, resp. rate 20, height 5\' 7"  (1.702 m), weight 210 lb (95.255 kg), SpO2 97.00%. General: Well developed, well nourished, in no acute distress; obese. HeHead: Eyes PERRLA, No xanthomas.   Normal cephalic and atramatic  Lungs: Clear bilaterally to auscultation and percussion. Heart: HRRR S1 S2, without MRG.  Pulses are 2+ & equal.           Soft  carotid bruit on the left. No JVD.  No abdominal bruits. No femoral bruits. Abdomen: Bowel sounds are positive, abdomen soft and non-tender without masses or                  Hernia's noted. Msk:  Back normal, normal gait. Normal strength and tone for age. Some reproducibility of pain with palpation. Soreness noted in legs with palpation. Extremities: No clubbing, cyanosis or edema.  DP +1 Neuro: Alert and oriented X 3. Psych:  Good affect, responds appropriately   Lab Results  Component Value Date   WBC 6.8 02/25/2011   HGB 15.4* 02/25/2011   HCT 45.6 02/25/2011   MCV 92.3 02/25/2011   PLT 226 02/25/2011     Lab 02/26/11 0150  NA 142  K 5.2*  CL 105  CO2 30  BUN 15  CREATININE 1.08  CALCIUM 9.2  PROT 6.4    BILITOT 0.2*  ALKPHOS 65  ALT 10  AST 13  GLUCOSE 95   Lab Results  Component Value Date   CKTOTAL 55 02/26/2011   CKMB 1.7 02/26/2011   TROPONINI 0.33* 02/26/2011   Radiology: Dg Chest Portable 1 View  02/25/2011  *RADIOLOGY REPORT*  Clinical Data: Chest pain  PORTABLE CHEST - 1 VIEW  Comparison: 03/12/2010  Findings: Normal heart size.  Mild left basilar atelectasis.  No pneumothorax.  Normal pulmonary vascularity.  Bronchitic changes.  IMPRESSION: Bronchitic changes.  Mild left basilar atelectasis.  Original Report Authenticated By: Donavan Burnet, M.D.   EKG: normal sinus rhythm, left atrial abnormality, right ventricular conduction delay, minor nonspecific ST-T wave abnormality, no significant change when compared to tracing performed the previous day.  ASSESSMENT AND PLAN:   1.Chest Pain: Atypical symptoms with multiple cardiovascular risk factors. Stress Myoview for diagnostic and prognostic purposes. She wishes to go home and have this test as an outpatient.  We will plan echocardiogram today for LV function and repeat EKG. In the absence of  LV function or EKG abnormalities, she can be discharged to return within next few days for a stress test.  2. Hypercholelesterolemia: Treated with pravastatin.  We will check fasting lipids to evaluate efficacy of current therapy.   3. Tobacco Abuse: Smoking cessation is strongly recommended.  Bettey Mare. Lyman Bishop NP [facility] 02/26/2011, 9:33 AM   Cardiology Attending Patient interviewed and examined. Discussed with Joni Reining, NP.  Above note annotated and modified based upon my findings.  Point-of-care troponins were negative, but laboratory arrived to values were minimally anatomically elevated in the absence of any abnormality whatsoever in total CPK or CPK-MB.  This probably does not represent myocardial necrosis.  Echocardiogram was reviewed and showed only LVH and RVH.  Regional and global LV systolic function were  normal.  We will proceed with discharge and plans for an outpatient stress test as described above.  [place] Bing, MD 02/26/2011, 8:48 PM

## 2011-02-28 ENCOUNTER — Telehealth: Payer: Self-pay | Admitting: Family Medicine

## 2011-02-28 DIAGNOSIS — Z720 Tobacco use: Secondary | ICD-10-CM

## 2011-02-28 NOTE — Telephone Encounter (Signed)
I apologize, pt was to be seen by urology, referral placed

## 2011-03-01 ENCOUNTER — Ambulatory Visit (INDEPENDENT_AMBULATORY_CARE_PROVIDER_SITE_OTHER): Payer: Medicare Other | Admitting: Psychiatry

## 2011-03-01 ENCOUNTER — Encounter (HOSPITAL_COMMUNITY): Payer: Self-pay | Admitting: Psychiatry

## 2011-03-01 ENCOUNTER — Other Ambulatory Visit (HOSPITAL_COMMUNITY): Payer: Self-pay | Admitting: Urology

## 2011-03-01 DIAGNOSIS — R3129 Other microscopic hematuria: Secondary | ICD-10-CM

## 2011-03-01 DIAGNOSIS — F329 Major depressive disorder, single episode, unspecified: Secondary | ICD-10-CM

## 2011-03-01 MED ORDER — CLONAZEPAM 0.5 MG PO TABS: 0.5000 mg | ORAL_TABLET | ORAL | Status: DC | PRN

## 2011-03-01 MED ORDER — ARIPIPRAZOLE 10 MG PO TABS: 10.0000 mg | ORAL_TABLET | Freq: Every day | ORAL | Status: DC

## 2011-03-01 MED ORDER — CITALOPRAM HYDROBROMIDE 40 MG PO TABS: 40.0000 mg | ORAL_TABLET | Freq: Every day | ORAL | Status: DC

## 2011-03-01 MED ORDER — BUPROPION HCL ER (SR) 150 MG PO TB12: 150.0000 mg | ORAL_TABLET | ORAL | Status: DC

## 2011-03-01 NOTE — Progress Notes (Signed)
Chief complaint I'm doing much better now however I was having chest pain and required hospitalization.  History of presenting illness Patient is 56 year old Caucasian female who came for her followup appointment. She was recently admitted and the hospital due to chest pain. She had echocardiogram EKG which were normal. She is scheduled to have a stress test next week. Patient is unclear why she had chest pain however she reported she was coughing a lot and Dr. believe it could be bronchitis. Overall her mood has been improved. She is less depressed with fewer crying spells. She likes Wellbutrin which has helped her some energy. She have some tremors which could be due to Wellbutrin but she does not want to stop at this time. She does not want to change her medication. She is also having some urinary complained and now scheduled to see neurologist next month. She is concerned about her medical issues but overall she is less depressed. She is sleeping fine and reported less complain of racing thoughts. She denies any agitation anger or mood swings. She denies any paranoia her suicidal thinking.  Current psychiatric medication Klonopin 0.5 mg daily Celexa 40 mg daily Wellbutrin SR 150 mg daily Abilify 10 mg daily Recently Neurontin is added by her primary care physician for back pain.  Medical history Patient has history of chronic back pain, hyperlipidemia, coronary artery calcification and recent chest pain.  Mental status examination Patient is casually dressed and fairly groomed. She maintained good  eye contact. She is calm and cooperative. Her speech is soft but clear and coherent. Her thought process is slow but logical linear and goal-directed. She described her mood is anxious and her affect is constricted. She denies any active or passive suicidal thinking and homicidal thinking. There were mild shakes present. There no psychotic symptoms present at this time. She denies any auditory or  visual hallucination. She's alert and oriented x3. Her attention and concentration is fair. Her insight judgment and impulse control is okay.  Diagnoses Axis I bipolar disorder, rule out Maj. depressive disorder Axis II deferred Axis III see medical history Axis IV moderate Axis V 55-60  Plan I have reviewed her blood tests and previous notes. Patient is doing much better with addition of Wellbutrin however she has to see neurologist and also scheduled to have a stress test. She does not want to change her antidepressant which is working GERD. I have explained risks and benefits of medication in detail. She does not drink or use any illegal substances. She will continue to see therapist for increase coping and social skills.  I will see her again in 3 weeks. Time spent 30 minutes

## 2011-03-06 ENCOUNTER — Ambulatory Visit (INDEPENDENT_AMBULATORY_CARE_PROVIDER_SITE_OTHER): Payer: Medicare Other | Admitting: Psychiatry

## 2011-03-06 ENCOUNTER — Ambulatory Visit (HOSPITAL_COMMUNITY)
Admission: RE | Admit: 2011-03-06 | Discharge: 2011-03-06 | Disposition: A | Discharge status: Home / Self Care | Payer: Medicare Other | Source: Ambulatory Visit | Attending: Urology | Admitting: Urology

## 2011-03-06 ENCOUNTER — Encounter (HOSPITAL_COMMUNITY): Payer: Self-pay | Admitting: Psychiatry

## 2011-03-06 DIAGNOSIS — F319 Bipolar disorder, unspecified: Secondary | ICD-10-CM

## 2011-03-06 DIAGNOSIS — R3129 Other microscopic hematuria: Secondary | ICD-10-CM | POA: Insufficient documentation

## 2011-03-06 NOTE — Patient Instructions (Signed)
Discussed orally 

## 2011-03-06 NOTE — Progress Notes (Signed)
Patient:  Tonya Sullivan   DOB: 04/18/1955  MR Number: [id number]  [facility]:  [address] [place] [place],  [place], [address]  Start: Tuesday 03/06/2011 2:10 PM End: Tuesday 03/06/2011 3:00 PM  Provider/Observer:     Florencia Reasons, MSW, LCSW   Chief Complaint:      Chief Complaint  Patient presents with  . Depression    Reason For Service:     The patient was referred for services by psychiatrist Dr. Lolly Mustache due to patient experiencing anxiety, stress, depression, and mood swings. She has a long-standing history of depression beginning in adolescence. Patient also has significant trauma history. Patient also is experiencing grief and loss issues due to to the death of her husband in 25-Oct-2010.  Interventions Strategy:  Supportive therapy, grief therapy  Participation Level:   Active  Participation Quality:  Appropriate      Behavioral Observation:  Fairly Groomed, Alert, and depressed  Current Psychosocial Factors: The patient continues to experience stress related to the recent death of her husband. She also recently had medical tests that indicated blood is in her urine. She has an appointment with her urologist this week to get the results.   Content of Session:   Reviewing symptoms, processing grief and loss, identifying ways to set and maintain boundaries in the relationship with her neighbor, practicing a mindfulness activity using breath awareness.  Current Status:   Patient reports improved mood and continued involvement in activity but continued sleep difficulty, anxiety, and sadness re: husband.  Patient Progress:   Good. Patient reports having to go to the hospital last week due to having chest pains. All live medical tests that have been completed so far have been negative for heart issues. The patient is scheduled for a stress test this Friday. She thinks the chest pains may have been triggered by anxiety related to her neighbor who has been intrusive.  Therapist worked with patient to identify ways to set and maintain boundaries with the neighbor. Patient reports continued grief and loss issues regarding the death of her husband and reports increased thoughts about husband when she recently attended her first doctor's appointment with a different specialist without her husband. Patient misses her husband's emotional support and presence. However, patient is able to identify other supportive people to help her in those situations. Patient reports she feels as though she is coping better with her husband's death. Patient also is pleased that she has had more contact from her son who remains supportive. She also reports recently in showing her mother spending the night with patient. Patient continues to experience sleep difficulty waking up several times per night. Therapist works with patient to identify ways to improve sleep hygiene and practice a mindfulness activity using breath awareness.  Target Goals:   1. Improving mood. 2. Improving coping skills  Last Reviewed:   07/12/09  Goals Addressed Today:    Improving coping skills, improving mood  Impression/Diagnosis:   The patient presents with a long-standing history of recurrent depression, mood swings, and a significant trauma history.  Diagnosis: Bipolar disorder  Diagnosis:  Axis I:  Bipolar Disorder           Axis II: Deferred

## 2011-03-08 ENCOUNTER — Other Ambulatory Visit: Payer: Self-pay | Admitting: *Deleted

## 2011-03-09 ENCOUNTER — Encounter (HOSPITAL_COMMUNITY): Payer: Self-pay

## 2011-03-09 ENCOUNTER — Encounter (HOSPITAL_COMMUNITY)
Admission: RE | Admit: 2011-03-09 | Discharge: 2011-03-09 | Disposition: A | Discharge status: Home / Self Care | Payer: Medicare Other | Source: Ambulatory Visit | Attending: Cardiology | Admitting: Cardiology

## 2011-03-09 ENCOUNTER — Ambulatory Visit (INDEPENDENT_AMBULATORY_CARE_PROVIDER_SITE_OTHER): Payer: Medicare Other

## 2011-03-09 DIAGNOSIS — R079 Chest pain, unspecified: Secondary | ICD-10-CM

## 2011-03-09 DIAGNOSIS — E785 Hyperlipidemia, unspecified: Secondary | ICD-10-CM | POA: Insufficient documentation

## 2011-03-09 DIAGNOSIS — J4489 Other specified chronic obstructive pulmonary disease: Secondary | ICD-10-CM | POA: Insufficient documentation

## 2011-03-09 DIAGNOSIS — J449 Chronic obstructive pulmonary disease, unspecified: Secondary | ICD-10-CM | POA: Insufficient documentation

## 2011-03-09 MED ORDER — TECHNETIUM TC 99M TETROFOSMIN IV KIT
30.0000 | PACK | Freq: Once | INTRAVENOUS | Status: AC | PRN
  Administered 2011-03-09: 30 via INTRAVENOUS

## 2011-03-09 MED ORDER — TECHNETIUM TC 99M TETROFOSMIN IV KIT
10.0000 | PACK | Freq: Once | INTRAVENOUS | Status: AC | PRN
  Administered 2011-03-09: 9.8 via INTRAVENOUS

## 2011-03-09 NOTE — Progress Notes (Signed)
Stress Lab Nurses Notes - Tonya Sullivan  Tonya Sullivan 03/09/2011  Reason for doing test: Chest Pain Type of test: Stress Myoview Nurse performing test: Parke Poisson, RN Nuclear Medicine Tech: Lyndel Pleasure Echo Tech: Not Applicable MD performing test: Ival Bible & Joni Reining NP Family MD: [facility] explained and consent signed: yes IV started: 22g jelco, Saline lock flushed, No redness or edema and Saline lock started in radiology Symptoms: Fatigue Treatment/Intervention: None Reason test stopped: fatigue After recovery IV was: Discontinued via X-ray tech and No redness or edema Patient to return to Nuc. Med at : 11:35 Patient discharged: Home Patient's Condition upon discharge was: stable Comments: During test peak BP 188/98 & HR 150.  Recovery BP 148/82 & HR 90.  Symptoms resolved in recovery. Erskine Speed T

## 2011-03-13 ENCOUNTER — Ambulatory Visit (INDEPENDENT_AMBULATORY_CARE_PROVIDER_SITE_OTHER): Payer: Medicare Other | Admitting: Psychiatry

## 2011-03-13 DIAGNOSIS — F319 Bipolar disorder, unspecified: Secondary | ICD-10-CM

## 2011-03-13 NOTE — Progress Notes (Signed)
Patient:  Tonya Sullivan   DOB: 27-Apr-1955  MR Number: [id number]  [facility]: [facility]:  [address] [place] [place],  [place], [address]  Start: Tuesday 03/13/2011 1:05 PM End: Tuesday 03/13/2011 2:50 PM  Provider/Observer:     Florencia Reasons, MSW, LCSW   Chief Complaint:      Chief Complaint  Patient presents with  . Depression    Reason For Service:     The patient was referred for services by psychiatrist Dr. Lolly Mustache due to patient experiencing anxiety, stress, depression, and mood swings. She has a long-standing history of depression beginning in adolescence. Patient also has significant trauma history. Patient also is experiencing grief and loss issues due to to the death of her husband in Oct 31, 2010. Patient is seen today due to increased depression and fleeting suicidal ideations.  Interventions Strategy:  Supportive therapy, grief therapy  Participation Level:   Active  Participation Quality:  Appropriate      Behavioral Observation:  Fairly Groomed, Alert, and depressed  Current Psychosocial Factors: The patient continues to experience stress related to the recent death of her husband. She also has not had contact with her son for the past week.  Content of Session:   Reviewing symptoms, processing grief and loss, identifying support system and ways to use, identifying ways to expand support system, identifying ways to increase social contact and involvement in activities.  Current Status:   Patient reports increased depressed mood and continued sleep difficulty. The patient also reports having fleetiing suicidal ideations this morning.  This appears to have been triggered by patient finding a medical treatment plan for chemotherapy for her husband last night  along with patient's concerns that she has not had contact from her son in a week. Patient reports she considered taking pills but decided against it. She contacted the crisis hotline and also scheduled an  appointment to see this clinician. Patient denies any current suicidal ideations and contracts for safety. She agrees to call this practice, call 911, or have someone take her to the emergency room should symptoms worsen. Patient Progress:   Poor. Patient reports increased depression and sadness triggered by finding a medical treatment plan for chemotherapy for her husband. She reports feeling helpless, hopeless, and experiencing fleeting suicidal ideations at the time. She contacted the crisis hotline and subsequently scheduled an appointment with this clinician. Therapist and patient discuss possible hospitalization. Patient reports now being more hopeful and denies any current  suicidal ideations. She is able to contract for safety. Therapist works with patient to identify ways to use her support system and to increase her involvement in activity. Therapist also encourages patient to contact the grief share support group. Patient is struggling with self image issues. Patient also expresses frustration regarding continued sleep difficulty stating she has been sleeping only 2-3 hours per night for the past several weeks. Patient reports having nightmares when she does sleep. Therapist wiil share information with Dr. Lolly Mustache.  Target Goals:   1. Improving mood. 2. Improving coping skills  Last Reviewed:   07/12/09  Goals Addressed Today:    Improving coping skills, improving mood  Impression/Diagnosis:   The patient presents with a long-standing history of recurrent depression, mood swings, and a significant trauma history.  Diagnosis: Bipolar disorder  Diagnosis:  Axis I:  Bipolar Disorder           Axis II: Deferred

## 2011-03-13 NOTE — Patient Instructions (Signed)
Discussed orally 

## 2011-03-15 ENCOUNTER — Encounter: Payer: Self-pay | Admitting: *Deleted

## 2011-03-15 ENCOUNTER — Ambulatory Visit (INDEPENDENT_AMBULATORY_CARE_PROVIDER_SITE_OTHER): Payer: Medicare Other | Admitting: Psychiatry

## 2011-03-15 ENCOUNTER — Encounter (HOSPITAL_COMMUNITY): Payer: Self-pay | Admitting: Psychiatry

## 2011-03-15 VITALS — Wt 214.0 lb

## 2011-03-15 DIAGNOSIS — G4700 Insomnia, unspecified: Secondary | ICD-10-CM

## 2011-03-15 MED ORDER — TEMAZEPAM 7.5 MG PO CAPS: 7.5000 mg | ORAL_CAPSULE | Freq: Every evening | ORAL | Status: DC | PRN

## 2011-03-15 NOTE — Progress Notes (Signed)
Chief complaint I cannot sleep and I'm very tired   History of presenting illness Patient is 56 year old Caucasian female who came for her followup appointment earlier than scheduled. She she complained of increased anxiety and nervousness and unable to sleep. Her anxiety get worse when she saw her husband belonging one week ago. She admitted crying spells and racing thoughts. She also admitted passive suicidal thinking however denies any active suicidal thoughts. She feels her current medications working however she cannot sleep and requesting some sleep medication. She denies any paranoia or any hallucination. She lives by herself however thought to her mother every day and sometimes with her son, both live in [place]. Patient is also thinking to visit her uncle who live in [place]. Patient is very close to with them. She denies any side effects of medication. She denies any agitation anger or any manic symptoms. She is not abusing her Klonopin or using drugs or drinking alcohol.  Current psychiatric medication Klonopin 0.5 mg daily Celexa 40 mg daily Wellbutrin SR 150 mg daily Abilify 10 mg daily  Medical history Patient has history of chronic back pain, hyperlipidemia, coronary artery calcification and recent chest pain.  Mental status examination Patient is casually dressed and fairly groomed. She she appears tired with poor eye contact. She described her mood is sad and depressed. Her affect is constricted. Her thought process is slow but logical linear and goal-directed. She has poverty of thought content. She denies any active suicidal thinking and homicidal thinking. She denies any auditory or visual hallucination. There no psychotic symptoms present. Her attention and concentration is fair. She's alert and oriented x3. Her insight judgment and impulse control is okay.  Diagnoses Axis I bipolar disorder, rule out Maj. depressive disorder, insomnia Axis II deferred Axis III see  medical history Axis IV moderate Axis V 55-60  Plan I reviewed psychosocial stressors, loss progress note, last therapy session note and current medication. I recommend to try temazepam 7.5 mg for sleep. I explained risks and benefits of medication. We talk about safety plan that in case her depression gets worse or any time having suicidal thinking and homicidal thinking then she need to call 911. We also talked about moving back to [place] or [place] to live close to her son and mother. Patient moved to this area due to her husband who is no longer in her life. She will continue all her other psychiatric medication. I will see her again next week. Time spent 30 minutes

## 2011-03-16 ENCOUNTER — Encounter: Payer: Self-pay | Admitting: Adult Health

## 2011-03-16 ENCOUNTER — Ambulatory Visit (INDEPENDENT_AMBULATORY_CARE_PROVIDER_SITE_OTHER): Payer: Medicare Other | Admitting: Adult Health

## 2011-03-16 DIAGNOSIS — R079 Chest pain, unspecified: Secondary | ICD-10-CM

## 2011-03-16 NOTE — Progress Notes (Signed)
HPI: Tonya Sullivan is a 56 y/o patient of Dr. Dietrich Pates we are seeing on hospital follow-up after having admission for chest pain. She was ruled our for MI and had a follow-up stress test as OP. She is here to discuss results.Shehas had no recurrence of pain and has been doing well without symptoms since being seen.  Allergies  Allergen Reactions  . Codeine Anaphylaxis  . Cyclobenzaprine Anaphylaxis  . Darvocet (Propoxyphene N-Acetaminophen) Anaphylaxis  . Adhesive (Tape) Hives  . Iodine Hives  . Levaquin Hives  . Prednisone Hives    Current Outpatient Prescriptions  Medication Sig Dispense Refill  . albuterol (PROVENTIL HFA;VENTOLIN HFA) 108 (90 BASE) MCG/ACT inhaler Inhale 2 puffs into the lungs every 4 (four) hours as needed. For shortness of breath      . ARIPiprazole (ABILIFY) 10 MG tablet Take 1 tablet (10 mg total) by mouth daily.  30 tablet  0  . aspirin (ASPIRIN EC) 81 MG EC tablet Take 1 tablet (81 mg total) by mouth daily. Swallow whole.  30 tablet  12  . buPROPion (WELLBUTRIN SR) 150 MG 12 hr tablet Take 1 tablet (150 mg total) by mouth every morning.  30 tablet  0  . citalopram (CELEXA) 40 MG tablet Take 1 tablet (40 mg total) by mouth daily.  30 tablet  0  . clonazePAM (KLONOPIN) 0.5 MG tablet Take 1 tablet (0.5 mg total) by mouth as needed for anxiety.  30 tablet  0  . oxyCODONE-acetaminophen (PERCOCET) 10-325 MG per tablet Take 1 tablet by mouth every 8 (eight) hours as needed. For pain      . pravastatin (PRAVACHOL) 40 MG tablet Take 1 tablet (40 mg total) by mouth at bedtime.  30 tablet  6  . temazepam (RESTORIL) 7.5 MG capsule Take 1 capsule (7.5 mg total) by mouth at bedtime as needed for sleep.  15 capsule  0    Past Medical History  Diagnosis Date  . COPD (chronic obstructive pulmonary disease)   . Depression   . Chronic back pain   . Hyperlipidemia   . Anxiety   . Bipolar disorder, unspecified   . Carotid artery calcification     Past Surgical History    Procedure Date  . Appendectomy   . Tonsillectomy   . Cholecystectomy   . Back surgery     x 5  . Lump left breast     removed- benign  . Abdominal hysterectomy     tubal pregnancy  . Inguinal hernia repair   . Stomach surgery     ?holes in esophgous    ZOX:WRUEAV of systems complete and found to be negative unless listed above PHYSICAL EXAM BP 135/80  Pulse 96  Resp 18  Ht 5\' 7"  (1.702 m)  Wt 216 lb (97.977 kg)  BMI 33.83 kg/m2  General: Well developed, well nourished, in no acute distress Head: Eyes PERRLA, No xanthomas.   Normal cephalic and atramatic  Lungs: Clear bilaterally to auscultation and percussion. Heart: HRRR S1 S2, without MRG.  Pulses are 2+ & equal.            No carotid bruit. No JVD.  No abdominal bruits. No femoral bruits. Abdomen: Bowel sounds are positive, abdomen soft and non-tender without masses or                  Hernia's noted. Msk:  Back normal, normal gait. Normal strength and tone for age. Extremities: No clubbing, cyanosis or edema.  DP +  1 Neuro: Alert and oriented X 3. Psych:  Good affect, responds appropriately   ASSESSMENT AND PLAN

## 2011-03-16 NOTE — Assessment & Plan Note (Signed)
Stress test is found to be normal without evidence of ischemia. She will continue with her current medication regimen. She is advised to follow-up with her PCP for continued risk management and treatment of medical problems. We, are, as always, available to see her again from cardiology standpoint,

## 2011-03-16 NOTE — Patient Instructions (Signed)
Your physician recommends that you schedule a follow-up appointment in: As needed  

## 2011-03-20 ENCOUNTER — Ambulatory Visit (HOSPITAL_COMMUNITY): Payer: Medicare Other | Admitting: Psychiatry

## 2011-03-22 ENCOUNTER — Encounter (HOSPITAL_COMMUNITY): Payer: Self-pay | Admitting: Psychiatry

## 2011-03-22 ENCOUNTER — Ambulatory Visit (INDEPENDENT_AMBULATORY_CARE_PROVIDER_SITE_OTHER): Payer: Medicare Other | Admitting: Psychiatry

## 2011-03-22 DIAGNOSIS — G4700 Insomnia, unspecified: Secondary | ICD-10-CM

## 2011-03-22 MED ORDER — TEMAZEPAM 15 MG PO CAPS: 15.0000 mg | ORAL_CAPSULE | Freq: Every evening | ORAL | Status: DC | PRN

## 2011-03-22 NOTE — Progress Notes (Signed)
Chief complaint I am doing better.  History of presenting illness Patient is 56 year old Caucasian female who came for her followup appointment. She is doing better on Restoril. She tried 7.5 mg however she could not sleep better and required 15 mg. She is sleeping very good. She denies any racing thoughts or crying spells. Her mood is improved. She feel her current medicine is also working very well. She's more calm, relaxed and denies any side effects. She is thinking to move closer to her mother. However, it will not happen very soon. She is working on this plan. Overall her mood has been stable. She denies any agitation anger or mood swings. She denies any suicidal thinking or homicidal thinking. She denies any bad dreams. She's not drinking or using any illegal substances. She continues to have support from her family. She denies any agitation anger or crying spells.  Current psychiatric medication Temazepam 15 mg at bedtime Celexa 40 mg daily Wellbutrin SR 150 mg daily Abilify 10 mg daily  Medical history Patient has history of chronic back pain, hyperlipidemia, coronary artery calcification and recent chest pain.  Mental status examination Patient is casually dressed and fairly groomed. She is pleasant cooperative and maintained good eye contact. Her speech is soft clear and coherent. Her thought process is logical linear and goal-directed. She denies any active or passive suicidal thinking and homicidal thinking. There were no psychotic symptoms present at this time. She described her mood is good and her affect is bright. From the past. She's alert and oriented x3. Her insight judgment and impulse control is okay.  Diagnoses Axis I bipolar disorder, rule out Maj. depressive disorder, insomnia Axis II deferred Axis III see medical history Axis IV moderate Axis V 55-60  Plan I will continue temazepam 15 mg at bedtime. She is not taking Klonopin at this time. However she is compliant  with her psychiatric medication. Risk and benefit explained to the patient in detail. We also discuss and reinforce safety plan that in case of worsening of her symptoms or any time having suicidal thinking and homicidal thinking then she need to call 911 or go to local emergency room. I will see her again in 4 weeks.

## 2011-03-23 ENCOUNTER — Ambulatory Visit (INDEPENDENT_AMBULATORY_CARE_PROVIDER_SITE_OTHER): Payer: Medicare Other | Admitting: Psychiatry

## 2011-03-23 ENCOUNTER — Encounter (HOSPITAL_COMMUNITY): Payer: Self-pay | Admitting: Psychiatry

## 2011-03-23 DIAGNOSIS — F319 Bipolar disorder, unspecified: Secondary | ICD-10-CM

## 2011-03-23 NOTE — Patient Instructions (Signed)
Improve self-care regarding eating meals regularly, contact the [employer] regarding water aerobics class, attend grief-share group

## 2011-03-23 NOTE — Progress Notes (Signed)
Patient:  Tonya Sullivan   DOB: 05-Jun-1955  MR Number: [id number]  Location: [facility]:  [address] [place] [place],  [place], [address]  Start: Friday 03/23/2011 2:00 PM End: Friday 03/23/2011 2:30 PM  Provider/Observer:     Florencia Reasons, MSW, LCSW   Chief Complaint:      Chief Complaint  Patient presents with  . Depression    Reason For Service:     The patient was referred for services by psychiatrist Dr. Lolly Mustache due to patient experiencing anxiety, stress, depression, and mood swings. She has a long-standing history of depression beginning in adolescence. Patient also has significant trauma history. Patient also is experiencing grief and loss issues due to to the death of her husband in 09-Oct-2010. Patient is seen today for follow up appointment.   Interventions Strategy:  Supportive therapy, cognitive behavioral therapy  Participation Level:   Active  Participation Quality:  Appropriate      Behavioral Observation:  Fairly Groomed, Alert, and depressed  Current Psychosocial Factors: The patient continues to experience stress related to the recent death of her husband. She also expresses concerns regarding her mother's upcoming surgery. Content of Session:   Reviewing symptoms, identifying ways to improve self-care, identifying ways to increase social contact and involvement in activities.  Current Status:   Patient reports improved mood and improved sleep pattern but continued sadness and poor motivation. She also continues to have passive suicidal ideations at times with no intent and no plan. She denies current suicidal ideations.She agrees to call this practice, call 911, or have someone take her to the emergency room should symptoms worsen.  Patient Progress:   Fair. The patient reports feeling much better since taking the temazepam as prescribed by Dr. Lolly Mustache. However she continues to miss her husband and remains poorly motivated to participate in activities. She  states mainly staying in at home and looking at television most of the day. Patient also reports poor eating habits as she is trying not to eat much as she is self-conscious about her weight gain. Therapist works with patient to identify ways to develop structure and to increase her involvement in activities. The therapist also works with patient to identify ways to improve self-care. Therapist and patient also discuss patient's feelings about attending the grief share group as well as identifying benefits of attending the group.  Target Goals:   1. Improving mood. 2. Improving coping skills  Last Reviewed:   07/12/09  Goals Addressed Today:    Improving coping skills, improving mood  Impression/Diagnosis:   The patient presents with a long-standing history of recurrent depression, mood swings, and a significant trauma history.  Diagnosis: Bipolar disorder  Diagnosis:  Axis I:  Bipolar Disorder           Axis II: Deferred

## 2011-03-27 ENCOUNTER — Ambulatory Visit (HOSPITAL_COMMUNITY): Payer: Medicare Other | Admitting: Psychiatry

## 2011-03-29 ENCOUNTER — Ambulatory Visit (HOSPITAL_COMMUNITY): Payer: Medicare Other | Admitting: Psychiatry

## 2011-04-06 ENCOUNTER — Ambulatory Visit (INDEPENDENT_AMBULATORY_CARE_PROVIDER_SITE_OTHER): Payer: Medicare Other | Admitting: Psychiatry

## 2011-04-06 DIAGNOSIS — F319 Bipolar disorder, unspecified: Secondary | ICD-10-CM

## 2011-04-09 NOTE — Progress Notes (Signed)
Patient:  Tonya Sullivan   DOB: 1955/12/05  MR Number: [id number]  Location: [facility]:  [address] [place] [place],  [place], [address]  Start: Friday 04/06/2011 2:05 PM End: Friday 04/06/2011 2:35 PM  Provider/Observer:     Florencia Reasons, MSW, LCSW   Chief Complaint:      Chief Complaint  Patient presents with  . Depression    Reason For Service:     The patient was referred for services by psychiatrist Dr. Lolly Mustache due to patient experiencing anxiety, stress, depression, and mood swings. She has a long-standing history of depression beginning in adolescence. Patient also has significant trauma history. Patient also is experiencing grief and loss issues due to to the death of her husband in 10-26-2010. Patient is seen today for follow up appointment.   Interventions Strategy:  Supportive therapy, cognitive behavioral therapy  Participation Level:   Active  Participation Quality:  Appropriate      Behavioral Observation:  Fairly Groomed, Alert, and depressed  Current Psychosocial Factors: The patient continues to experience stress related to the recent death of her husband.  Content of Session:   Reviewing symptoms, identifying ways to improve self-care, identifying ways to increase social contact and involvement in activities, processing feelings, reviewing treatment plan.  Current Status:   Patient reports improved mood and improved sleep pattern but continued sadness and poor motivation with anxiety about being away from home. She denies suicidal and homicidal ideations.  Patient Progress:   Fair. The patient reports continued improved sleep pattern. She continues to miss her husband and remains poorly motivated to participate in activities outside of her her home. Patient is becoming more self-conscious due to her weight gain. She states mainly staying in at home most of the time but being more engaged in activities at home such as house cleaning and cooking.  She also  reports enjoying recently having her son and his family for an overnight visit.  She reports her son is contacting her more frequently. Therapist and patient also discuss patient's feelings about attending the grief share group. Patient expresses fear that attending group may mean she would have to "Let go" regarding her husband.   Target Goals:   1. Improving mood as evidenced by resuming normal interest in activities and increased social involvement. 2. Increase self acceptance and improve self-esteem as evidenced by patient making positive statements about self. 3.  Improve coping and relaxation techniques. 4. Process and resolve grief and loss issues.   Last Reviewed:   04/06/2011  Goals Addressed Today:    Improving coping skills, improving mood  Impression/Diagnosis:   The patient presents with a long-standing history of recurrent depression, mood swings, and a significant trauma history.  Diagnosis: Bipolar disorder  Diagnosis:  Axis I:  Bipolar Disorder           Axis II: Deferred

## 2011-04-09 NOTE — Patient Instructions (Signed)
Discussed orally 

## 2011-04-10 ENCOUNTER — Other Ambulatory Visit (HOSPITAL_COMMUNITY): Payer: Self-pay | Admitting: Psychiatry

## 2011-04-10 DIAGNOSIS — F329 Major depressive disorder, single episode, unspecified: Secondary | ICD-10-CM

## 2011-04-10 MED ORDER — CITALOPRAM HYDROBROMIDE 40 MG PO TABS: 40.0000 mg | ORAL_TABLET | Freq: Every day | ORAL | Status: DC

## 2011-04-10 MED ORDER — ARIPIPRAZOLE 10 MG PO TABS: 10.0000 mg | ORAL_TABLET | Freq: Every day | ORAL | Status: DC

## 2011-04-10 MED ORDER — BUPROPION HCL ER (SR) 150 MG PO TB12: 150.0000 mg | ORAL_TABLET | ORAL | Status: DC

## 2011-04-23 ENCOUNTER — Other Ambulatory Visit: Payer: Self-pay | Admitting: Family Medicine

## 2011-04-24 ENCOUNTER — Ambulatory Visit (INDEPENDENT_AMBULATORY_CARE_PROVIDER_SITE_OTHER): Payer: Medicare Other | Admitting: Psychiatry

## 2011-04-24 ENCOUNTER — Encounter (HOSPITAL_COMMUNITY): Payer: Self-pay | Admitting: Psychiatry

## 2011-04-24 VITALS — Wt 212.0 lb

## 2011-04-24 DIAGNOSIS — F319 Bipolar disorder, unspecified: Secondary | ICD-10-CM

## 2011-04-24 DIAGNOSIS — F329 Major depressive disorder, single episode, unspecified: Secondary | ICD-10-CM

## 2011-04-24 MED ORDER — BUPROPION HCL ER (XL) 300 MG PO TB24: 300.0000 mg | ORAL_TABLET | ORAL | Status: DC

## 2011-04-24 MED ORDER — TEMAZEPAM 15 MG PO CAPS: 15.0000 mg | ORAL_CAPSULE | Freq: Every evening | ORAL | Status: DC | PRN

## 2011-04-24 NOTE — Progress Notes (Signed)
Chief complaint I still have anxiety and nervousness but I am sleeping better.   History of presenting illness Patient is 56 year old Caucasian female who came for her followup appointment. She is doing better on Restoril. She is taking 15 mg at bedtime.  She sleeps 6-7 hours without any problem.  She continues to have anxiety and racing thoughts.  She feels very nervous especially in the night time .  She also endorse depressive thoughts and crying spells and sometimes feeling of hopeless and helpless but she denies any active or passive suicidal thinking.  She is seeing therapist regularly.  Overall she feel that medicine helping her however she cannot control her crying spells and racing thoughts.  She denies any agitation anger or mood swings.  She does not drink or use any illegal substance.  She does have support from her family.  Current psychiatric medication Temazepam 15 mg at bedtime Celexa 40 mg daily Wellbutrin SR 150 mg daily Abilify 10 mg daily  Medical history Patient has history of chronic back pain, hyperlipidemia, coronary artery calcification and recent chest pain.  Mental status examination Patient is casually dressed and fairly groomed. She is anxious but cooperative and maintained fair eye contact. Her speech is soft clear and coherent. Her thought process is slow but logical linear and goal-directed. She denies any active or passive suicidal thinking and homicidal thinking. There were no psychotic symptoms present at this time. She described her mood is good and her affect is bright. From the past. She's alert and oriented x3. Her insight judgment and impulse control is okay.  Diagnoses Axis I bipolar disorder, rule out Maj. depressive disorder, insomnia Axis II deferred Axis III see medical history Axis IV moderate Axis V 55-60  Plan I will increase her Wellbutrin to 300 mg .  In the past she had tried 300 mg but to stop due to headache however she realized she  increase Wellbutrin in the past of few days , now she is taking 150 for a while and does not have any side effects to like to try 300 again .  I will continue temazepam 15 mg at bedtime.  She is compliant with her psychiatric medication. Risk and benefit explained to the patient in detail.  I recommended to call us if she feels worsening of her symptoms or any time having suicidal thinking and homicidal thinking.  I will see her again in 3 weeks.  She has remaining refill on her Abilify and Celexa .    A new prescription of temazepam and increase Wellbutrin is given.

## 2011-04-25 ENCOUNTER — Encounter (HOSPITAL_COMMUNITY): Payer: Self-pay

## 2011-04-25 NOTE — Progress Notes (Signed)
Patient ID: Tonya Sullivan, female   DOB: 1955/07/01, 56 y.o.   MRN: [id number] approved for Temasepam 15 mg QD. Authorization approved through 01/01/12  Starting 04/25/11. I spoke with [name] at [id number] for pre-authorization. Patient called with this information.

## 2011-04-27 ENCOUNTER — Ambulatory Visit (HOSPITAL_COMMUNITY): Payer: Self-pay | Admitting: Psychiatry

## 2011-04-30 ENCOUNTER — Ambulatory Visit (INDEPENDENT_AMBULATORY_CARE_PROVIDER_SITE_OTHER): Payer: Medicare Other | Admitting: Psychiatry

## 2011-04-30 DIAGNOSIS — F319 Bipolar disorder, unspecified: Secondary | ICD-10-CM

## 2011-04-30 DIAGNOSIS — F331 Major depressive disorder, recurrent, moderate: Secondary | ICD-10-CM

## 2011-04-30 NOTE — Patient Instructions (Signed)
Discussed orally 

## 2011-04-30 NOTE — Progress Notes (Signed)
Patient:  Tonya Sullivan   DOB: [birth year]  MR Number: [id number]  Location: [facility]:  [address] [place] [place],  [place], [address]  Start: Monday 04/30/2011 1:00 PM End: Monday 04/30/2011 1:25 PM  Provider/Observer:     Florencia Reasons, MSW, LCSW   Chief Complaint:      Chief Complaint  Patient presents with  . Depression    Reason For Service:     The patient was referred for services by psychiatrist Dr. Lolly Mustache due to patient experiencing anxiety, stress, depression, and mood swings. She has a long-standing history of depression beginning in adolescence. Patient also has significant trauma history. Patient also is experiencing grief and loss issues due to to the death of her husband in October 30, 2010. Patient is seen today for follow up appointment.   Interventions Strategy:  Supportive therapy, cognitive behavioral therapy  Participation Level:   Active  Participation Quality:  Appropriate      Behavioral Observation:  Fairly Groomed, Alert, and anxious  Current Psychosocial Factors:   Content of Session:   Reviewing symptoms, processing feelings, identifying thought patterns and effects on mood and behavior, identifying ways to challenge negative thought patterns  Current Status:   Patient reports continued improved mood but increased anxiety  Patient Progress:   Fair. The patient reports increased socialization with her family. She has visited her mother. She also has visited her son and his girlfriend in their new home. She reports feeling more connected to her son, his girlfriend, and her granddaughter. However she reports feeling uncomfortable being around other people and admits she is so was the others may have negative thoughts about her. She also reports increased fear that something bad is going to happen. Therapist works with patient to identify triggers of her thought patterns and effects of husband's death on thought patterns. Therapist also works with  patient to identify the effects of thought patterns on patient's mood and behavior as well as identify ways to challenge thought patterns. Patient reports she plans to attend the grief share group tomorrow  Target Goals:   1. Improving mood as evidenced by resuming normal interest in activities and increased social involvement. 2. Increase self acceptance and improve self-esteem as evidenced by patient making positive statements about self. 3.  Improve coping and relaxation techniques. 4. Process and resolve grief and loss issues.   Last Reviewed:   04/06/2011  Goals Addressed Today:    Improving coping skills, improving mood  Impression/Diagnosis:   The patient presents with a long-standing history of recurrent depression, mood swings, and a significant trauma history.  Diagnosis: Bipolar disorder  Diagnosis:  Axis I:  Bipolar Disorder           Axis II: Deferred

## 2011-05-08 ENCOUNTER — Ambulatory Visit (INDEPENDENT_AMBULATORY_CARE_PROVIDER_SITE_OTHER): Payer: Medicare Other | Admitting: Psychiatry

## 2011-05-08 ENCOUNTER — Encounter (HOSPITAL_COMMUNITY): Payer: Self-pay | Admitting: Psychiatry

## 2011-05-08 VITALS — Wt 209.0 lb

## 2011-05-08 DIAGNOSIS — F329 Major depressive disorder, single episode, unspecified: Secondary | ICD-10-CM

## 2011-05-08 DIAGNOSIS — F319 Bipolar disorder, unspecified: Secondary | ICD-10-CM

## 2011-05-08 MED ORDER — BUPROPION HCL ER (XL) 300 MG PO TB24: 300.0000 mg | ORAL_TABLET | ORAL | Status: DC

## 2011-05-08 MED ORDER — TEMAZEPAM 15 MG PO CAPS: 15.0000 mg | ORAL_CAPSULE | Freq: Every evening | ORAL | Status: DC | PRN

## 2011-05-08 MED ORDER — ARIPIPRAZOLE 10 MG PO TABS: 10.0000 mg | ORAL_TABLET | Freq: Every day | ORAL | Status: DC

## 2011-05-08 MED ORDER — CITALOPRAM HYDROBROMIDE 40 MG PO TABS: 40.0000 mg | ORAL_TABLET | Freq: Every day | ORAL | Status: DC

## 2011-05-08 NOTE — Progress Notes (Signed)
Chief complaint I am doing better.     History of presenting illness Patient is 56 year old Caucasian female who came for her followup appointment.  She is happy as she lost weight 5 pounds from the past .  She is doing better on Restoril. She is taking 15 mg at bedtime.  She sleeps 6-7 hours without any problem.  She still have anxiety and racing thoughts but they are less intense and less frequent.  She denies any recent crying spells or racing thoughts.  She is seeing therapist regularly.  On her last visit we have increased the Wellbutrin to 300 and she liked increased dose of Wellbutrin. She denies any side effects of medication.  She still misses her deceased husband.  She denies any agitation anger or mood swings.  She denies any paranoid thinking.  She had a good support from her family.  Current psychiatric medication Temazepam 15 mg at bedtime Celexa 40 mg daily Wellbutrin XL 300 mg daily  Abilify 10 mg daily  Medical history Patient has history of chronic back pain, hyperlipidemia, coronary artery calcification and recent chest pain.  Mental status examination Patient is casually dressed and fairly groomed. She is anxious but cooperative and maintained fair eye contact. Her speech is soft clear and coherent. Her thought process is slow but logical linear and goal-directed. She denies any active or passive suicidal thinking and homicidal thinking. There were no psychotic symptoms present at this time. She described her mood is good and her affect is bright from the past. She's alert and oriented x3. Her insight judgment and impulse control is okay.  Diagnoses Axis I bipolar disorder, rule out Maj. depressive disorder, insomnia Axis II deferred Axis III see medical history Axis IV moderate Axis V 55-60  Plan I will continue her current psychiatric medication.  Patient does not have any tremors shakes or any side effects.  She will see therapist regularly for coping skills.  I  recommend to call us if she feels worsening of her symptoms or if she has an issue with the medication otherwise I will see her again in 8 weeks.

## 2011-05-14 ENCOUNTER — Ambulatory Visit (HOSPITAL_COMMUNITY): Payer: Self-pay | Admitting: Psychiatry

## 2011-05-24 ENCOUNTER — Encounter (HOSPITAL_COMMUNITY): Payer: Self-pay | Admitting: *Deleted

## 2011-05-24 NOTE — Progress Notes (Signed)
Received authorization from [facility] for Temazepam 15 mg.Effective 03/22/11 through 01/01/12

## 2011-05-25 ENCOUNTER — Telehealth: Payer: Self-pay | Admitting: Family Medicine

## 2011-05-25 NOTE — Telephone Encounter (Signed)
Does this need to be done?

## 2011-05-25 NOTE — Telephone Encounter (Signed)
I sent something in Feb, but at this point we need to get a new CAPS form or call there office if she has not heard.

## 2011-06-06 ENCOUNTER — Ambulatory Visit (HOSPITAL_COMMUNITY): Payer: Self-pay | Admitting: Psychiatry

## 2011-06-07 ENCOUNTER — Ambulatory Visit (INDEPENDENT_AMBULATORY_CARE_PROVIDER_SITE_OTHER): Payer: Medicare Other | Admitting: Psychiatry

## 2011-06-07 DIAGNOSIS — F329 Major depressive disorder, single episode, unspecified: Secondary | ICD-10-CM

## 2011-06-07 DIAGNOSIS — F331 Major depressive disorder, recurrent, moderate: Secondary | ICD-10-CM

## 2011-06-07 NOTE — Progress Notes (Signed)
Patient:  Tonya Sullivan   DOB: [birth year]  MR Number: [id number]  Location: [facility]:  [address] [place] [place],  [place], [address]  Start: Thursday 06/07/2011 1:10 PM End: Thursday 06/07/2011 1:35 PM  Provider/Observer:     Florencia Reasons, MSW, LCSW   Chief Complaint:      Chief Complaint  Patient presents with  . Depression    Reason For Service:     The patient was referred for services by psychiatrist Dr. Lolly Mustache due to patient experiencing anxiety, stress, depression, and mood swings. She has a long-standing history of depression beginning in adolescence. Patient also has significant trauma history. Patient also is experiencing grief and loss issues due to to the death of her husband in 28-Oct-2010. Patient is seen today for follow up appointment.   Interventions Strategy:  Supportive therapy, cognitive behavioral therapy  Participation Level:   Active  Participation Quality:  Appropriate      Behavioral Observation:  Fairly Groomed, Alert, and anxious  Current Psychosocial Factors: Patient's husband died 10-28-2010 Content of Session:   Reviewing symptoms, processing feelings, identifying thought patterns and effects on mood and behavior, identifying ways to challenge negative thought patterns, identifying ways to increase involvement in activity and improve self-care.  Current Status:   Patient reports depressed mood, decreased appetite, poor motivation, paranoia , and isolative behaviors. She denies SI/HI.  Patient Progress:   Fair. The patient reports continued  socialization with her family but reports increased anxiety and paranoia regarding being around others as well as leaving home. Patient reports becoming more isolated and avoiding doing errands such as going to the grocery store. She also reports increased sadness and thoughts about deceased husband.  This was triggered by her granddaughter's recent birthday party as patient wished that her husband  could have been with her at the party. Therapist works with patient to process grief and loss issues as well as identify ways to simplify tasks and increase social contact in small steps.  Target Goals:   1. Improving mood as evidenced by resuming normal interest in activities and increased social involvement. 2. Increase self acceptance and improve self-esteem as evidenced by patient making positive statements about self. 3.  Improve coping and relaxation techniques. 4. Process and resolve grief and loss issues.   Last Reviewed:   04/06/2011  Goals Addressed Today:    Improving coping skills, improving mood  Impression/Diagnosis:   The patient presents with a long-standing history of recurrent depression, mood swings, and a significant trauma history.  Diagnosis: Bipolar disorder  Diagnosis:  Axis I:  Major Depressive Disorder           Axis II: Deferred

## 2011-06-07 NOTE — Patient Instructions (Signed)
Discussed orally 

## 2011-06-28 ENCOUNTER — Ambulatory Visit (INDEPENDENT_AMBULATORY_CARE_PROVIDER_SITE_OTHER): Payer: Medicare Other | Admitting: Psychiatry

## 2011-06-28 DIAGNOSIS — F329 Major depressive disorder, single episode, unspecified: Secondary | ICD-10-CM

## 2011-06-28 NOTE — Patient Instructions (Signed)
Discussed orally 

## 2011-06-28 NOTE — Progress Notes (Signed)
Patient:  Tonya Sullivan   DOB: 12/15/1955  MR Number: [id number]  Location: [facility]:  [address] [place] [place],  [place], [address]  Start: Thursday 06/28/2011 1:10 PM End: Thursday 06/28/2011 1:40 PM  Provider/Observer:     Florencia Reasons, MSW, LCSW   Chief Complaint:      Chief Complaint  Patient presents with  . Depression    Reason For Service:     The patient was referred for services by psychiatrist Dr. Lolly Mustache due to patient experiencing anxiety, stress, depression, and mood swings. She has a long-standing history of depression beginning in adolescence. Patient also has significant trauma history. Patient also is experiencing grief and loss issues due to to the death of her husband in 2010-10-14. Patient is seen today for follow up appointment.   Interventions Strategy:  Supportive therapy, cognitive behavioral therapy  Participation Level:   Active  Participation Quality:  Appropriate      Behavioral Observation:  Fairly Groomed, Alert, and anxious  Current Psychosocial Factors: Patient's husband died 2010/10/14.  Content of Session:   Reviewing symptoms, processing feelings, identifying and challenging cognitive distortions, normalization, identifying ways to set and maintain boundaries  Current Status:   Patient reports improved mood, increased motivation, decreased paranoia , and decreased isolative behaviors.   Patient Progress:   Good. The patient reports increased involvement with her son and his family as patient is staying at their home2-3 nights a week. She reports enjoying being around them and being more involved in activity. She reports going to play Bingo go with her son's girlfriend as well as going out to dinner with son and his family. She reports still remaining guarded at times due to past negative history with her son's girlfriend. Patient expresses guilt about wanting to go home and be by herself at times. Patient states feeling bad for  wanting time away from her 83-year-old granddaughter to rest since there was such a long period of time when she was not allowed to see her granddaughter. Therapist works with patient to process her feelings and to identify and challenge should and ought statements. Therapist also works with patient to identify ways to set and maintain boundaries in the relationship with her son and his family. Patient continues to miss her  husband but reports feeling better since she is able to talk with her son's girlfriend about her deceased husband. She reports she still is not ready to attend the grief share group.   Target Goals:   1. Improving mood as evidenced by resuming normal interest in activities and increased social involvement. 2. Increase self acceptance and improve self-esteem as evidenced by patient making positive statements about self. 3.  Improve coping and relaxation techniques. 4. Process and resolve grief and loss issues.   Last Reviewed:   04/06/2011  Goals Addressed Today:    Improving coping skills, improving mood  Impression/Diagnosis:   The patient presents with a long-standing history of recurrent depression, mood swings, and a significant trauma history.  Diagnosis: Bipolar disorder  Diagnosis:  Axis I:  Major Depressive Disorder           Axis II: Deferred

## 2011-06-29 NOTE — Telephone Encounter (Signed)
Pt does not qualify for cap services due to insurance.

## 2011-07-09 ENCOUNTER — Telehealth: Payer: Self-pay | Admitting: Family Medicine

## 2011-07-09 ENCOUNTER — Emergency Department (HOSPITAL_COMMUNITY)
Admission: EM | Admit: 2011-07-09 | Discharge: 2011-07-09 | Disposition: A | Discharge status: Home / Self Care | Payer: Medicare Other | Attending: Emergency Medicine | Admitting: Emergency Medicine

## 2011-07-09 ENCOUNTER — Encounter (HOSPITAL_COMMUNITY): Payer: Self-pay | Admitting: *Deleted

## 2011-07-09 DIAGNOSIS — E785 Hyperlipidemia, unspecified: Secondary | ICD-10-CM | POA: Insufficient documentation

## 2011-07-09 DIAGNOSIS — F17200 Nicotine dependence, unspecified, uncomplicated: Secondary | ICD-10-CM | POA: Insufficient documentation

## 2011-07-09 DIAGNOSIS — F319 Bipolar disorder, unspecified: Secondary | ICD-10-CM | POA: Insufficient documentation

## 2011-07-09 DIAGNOSIS — M545 Low back pain, unspecified: Secondary | ICD-10-CM | POA: Insufficient documentation

## 2011-07-09 DIAGNOSIS — Z818 Family history of other mental and behavioral disorders: Secondary | ICD-10-CM | POA: Insufficient documentation

## 2011-07-09 DIAGNOSIS — M79609 Pain in unspecified limb: Secondary | ICD-10-CM | POA: Insufficient documentation

## 2011-07-09 DIAGNOSIS — J449 Chronic obstructive pulmonary disease, unspecified: Secondary | ICD-10-CM | POA: Insufficient documentation

## 2011-07-09 DIAGNOSIS — Z7982 Long term (current) use of aspirin: Secondary | ICD-10-CM | POA: Insufficient documentation

## 2011-07-09 DIAGNOSIS — Z9089 Acquired absence of other organs: Secondary | ICD-10-CM | POA: Insufficient documentation

## 2011-07-09 DIAGNOSIS — M549 Dorsalgia, unspecified: Secondary | ICD-10-CM

## 2011-07-09 DIAGNOSIS — L03211 Cellulitis of face: Secondary | ICD-10-CM | POA: Insufficient documentation

## 2011-07-09 DIAGNOSIS — L0201 Cutaneous abscess of face: Secondary | ICD-10-CM | POA: Insufficient documentation

## 2011-07-09 DIAGNOSIS — J4489 Other specified chronic obstructive pulmonary disease: Secondary | ICD-10-CM | POA: Insufficient documentation

## 2011-07-09 DIAGNOSIS — Z90710 Acquired absence of both cervix and uterus: Secondary | ICD-10-CM | POA: Insufficient documentation

## 2011-07-09 DIAGNOSIS — F411 Generalized anxiety disorder: Secondary | ICD-10-CM | POA: Insufficient documentation

## 2011-07-09 MED ORDER — SULFAMETHOXAZOLE-TRIMETHOPRIM 800-160 MG PO TABS: 1.0000 | ORAL_TABLET | Freq: Two times a day (BID) | ORAL | Status: AC

## 2011-07-09 MED ORDER — CARISOPRODOL 350 MG PO TABS: 350.0000 mg | ORAL_TABLET | Freq: Three times a day (TID) | ORAL | Status: AC

## 2011-07-09 NOTE — ED Provider Notes (Signed)
History   This chart was scribed for Tonya Hutching, MD by Sofie Rower. The patient was seen in room APA17/APA17 and the patient's care was started at 7:29 AM     CSN: [id number]  Arrival date & time 07/09/11  [id number]   First MD Initiated Contact with Patient 07/09/11 [phone]      Chief Complaint  Patient presents with  . Back Pain  . Facial Pain    (Consider location/radiation/quality/duration/timing/severity/associated sxs/prior treatment) HPI  Tonya Sullivan is a 56 y.o. female who presents to the Emergency Department complaining of moderate, episodic facial pain located at the left lower lip onset one week ago. The pt also complains of moderate, episodic lower back pain onset today with associated symptoms of radiating leg pain ( Pt visits with Dr. Bartolo Darter at [facility]). Pt has a hx of chronic radiating leg pain, back surgeries (Dr. Danielle Dess, most recent procedure performed in 2010), vaginal cysts, allergy to Levaquin.   Pt denies bowel or bladder incontinence.   PCP is Dr. Jeanice Lim.     Past Medical History  Diagnosis Date  . COPD (chronic obstructive pulmonary disease)   . Depression   . Chronic back pain   . Hyperlipidemia   . Anxiety   . Bipolar disorder, unspecified   . Carotid artery calcification     Past Surgical History  Procedure Date  . Appendectomy   . Tonsillectomy   . Cholecystectomy   . Back surgery     x 5  . Lump left breast     removed- benign  . Abdominal hysterectomy     tubal pregnancy  . Inguinal hernia repair   . Stomach surgery     ?holes in esophgous    Family History  Problem Relation Age of Onset  . Hypertension Mother   . Heart disease Mother   . Cancer Father     lung cancer  . Hypertension Brother   . Alcohol abuse Brother   . Bipolar disorder Brother   . Alcohol abuse Sister   . Bipolar disorder Sister   . Alcohol abuse Brother   . Bipolar disorder Brother   . Alcohol abuse Sister   . Bipolar disorder Sister   .  Alcohol abuse Brother   . Bipolar disorder Brother   . Bipolar disorder Brother   . Bipolar disorder Maternal Aunt   . Bipolar disorder Paternal Aunt   . Bipolar disorder Maternal Uncle   . Bipolar disorder Paternal Uncle   . Bipolar disorder Maternal Grandfather   . Alcohol abuse Maternal Grandfather   . Bipolar disorder Maternal Grandmother   . Alcohol abuse Maternal Grandmother   . Bipolar disorder Paternal Grandfather   . Alcohol abuse Paternal Grandfather   . Bipolar disorder Paternal Grandmother   . Alcohol abuse Paternal Grandmother   . Bipolar disorder Maternal Uncle   . Heart attack Mother   . Heart attack Father     History  Substance Use Topics  . Smoking status: Current Everyday Smoker -- 1.0 packs/day    Types: Cigarettes  . Smokeless tobacco: Never Used  . Alcohol Use: No    OB History    Grav Para Term Preterm Abortions TAB SAB Ect Mult Living                  Review of Systems  All other systems reviewed and are negative.    10 Systems reviewed and all are negative for acute change except as noted in  the HPI.    Allergies  Codeine; Cyclobenzaprine; Darvocet; Adhesive; Iodine; Levofloxacin; and Prednisone  Home Medications   Current Outpatient Rx  Name Route Sig Dispense Refill  . ALBUTEROL SULFATE HFA 108 (90 BASE) MCG/ACT IN AERS Inhalation Inhale 2 puffs into the lungs every 4 (four) hours as needed. For shortness of breath    . ASPIRIN 81 MG PO TBEC Oral Take 1 tablet (81 mg total) by mouth daily. Swallow whole. 30 tablet 12  . BUPROPION HCL ER (XL) 300 MG PO TB24 Oral Take 1 tablet (300 mg total) by mouth every morning. 30 tablet 1  . CITALOPRAM HYDROBROMIDE 40 MG PO TABS Oral Take 1 tablet (40 mg total) by mouth daily. 30 tablet 1  . OXYCODONE-ACETAMINOPHEN 10-325 MG PO TABS Oral Take 1 tablet by mouth every 8 (eight) hours as needed. For pain    . PRAVASTATIN SODIUM 40 MG PO TABS  TAKE 1 TABLET BY MOUTH EVERY NIGHT AT BEDTIME 90 tablet 1     **Patient requests 90 day supply**  . TEMAZEPAM 15 MG PO CAPS Oral Take 1 capsule (15 mg total) by mouth at bedtime as needed for sleep or anxiety. 30 capsule 1    BP 123/80  Temp 98.4 F (36.9 C) (Oral)  Resp 20  SpO2 96%  Physical Exam  Nursing note and vitals reviewed. Constitutional: She is oriented to person, place, and time. She appears well-developed and well-nourished.  HENT:  Head: Atraumatic.  Nose: Nose normal.       Left lower incisor below gum tender, lump in left lower lip.  0.75 cm indurated area in the left lower skin.   Musculoskeletal: Normal range of motion.  Neurological: She is alert and oriented to person, place, and time.  Skin: Skin is warm and dry.  Psychiatric: She has a normal mood and affect. Her behavior is normal.    ED Course  Procedures (including critical care time)  DIAGNOSTIC STUDIES: Oxygen Saturation is 96% on room air, adequate by my interpretation.    COORDINATION OF CARE:    7:34AM- EDP at bedside discusses treatment plan concerning administration of antibiotics.   Labs Reviewed - No data to display No results found.   No diagnosis found.    MDM   Minor left facial abscess will be treated with Septra for 10 days. Soma for chronic back pain with radiation to right leg. Patient will see neurosurgeon.    I personally performed the services described in this documentation, which was scribed in my presence. The recorded information has been reviewed and considered.    Tonya Hutching, MD 07/09/11 [phone]

## 2011-07-09 NOTE — ED Notes (Signed)
Pt c/o lower back pain (burning sensation) and bump under bottom left side of lip.

## 2011-07-09 NOTE — ED Notes (Signed)
Patient with no complaints at this time. Respirations even and unlabored. Skin warm/dry. Discharge instructions reviewed with patient at this time. Patient given opportunity to voice concerns/ask questions. Patient discharged at this time and left Emergency Department with steady gait.   

## 2011-07-10 ENCOUNTER — Encounter (HOSPITAL_COMMUNITY): Payer: Self-pay | Admitting: Psychiatry

## 2011-07-10 ENCOUNTER — Ambulatory Visit (INDEPENDENT_AMBULATORY_CARE_PROVIDER_SITE_OTHER): Payer: Medicare Other | Admitting: Psychiatry

## 2011-07-10 ENCOUNTER — Telehealth: Payer: Self-pay | Admitting: Family Medicine

## 2011-07-10 ENCOUNTER — Ambulatory Visit (INDEPENDENT_AMBULATORY_CARE_PROVIDER_SITE_OTHER): Payer: Medicare Other | Admitting: Family Medicine

## 2011-07-10 ENCOUNTER — Encounter: Payer: Self-pay | Admitting: Family Medicine

## 2011-07-10 VITALS — BP 104/80 | HR 81 | Temp 98.3°F | Resp 16 | Ht 67.0 in | Wt 213.4 lb

## 2011-07-10 DIAGNOSIS — R220 Localized swelling, mass and lump, head: Secondary | ICD-10-CM

## 2011-07-10 DIAGNOSIS — F319 Bipolar disorder, unspecified: Secondary | ICD-10-CM

## 2011-07-10 DIAGNOSIS — F329 Major depressive disorder, single episode, unspecified: Secondary | ICD-10-CM

## 2011-07-10 MED ORDER — CITALOPRAM HYDROBROMIDE 40 MG PO TABS: 40.0000 mg | ORAL_TABLET | Freq: Every day | ORAL | Status: DC

## 2011-07-10 MED ORDER — TEMAZEPAM 15 MG PO CAPS: 15.0000 mg | ORAL_CAPSULE | Freq: Every day | ORAL | Status: DC

## 2011-07-10 MED ORDER — BUPROPION HCL ER (XL) 300 MG PO TB24: 300.0000 mg | ORAL_TABLET | ORAL | Status: DC

## 2011-07-10 MED ORDER — CLINDAMYCIN HCL 300 MG PO CAPS: 300.0000 mg | ORAL_CAPSULE | Freq: Three times a day (TID) | ORAL | Status: AC

## 2011-07-10 NOTE — Telephone Encounter (Signed)
Patient is aware 

## 2011-07-10 NOTE — Progress Notes (Signed)
Chief complaint I stopped taking Abilify due to tremors .       History of presenting illness Patient is 56 year old Caucasian female who came for her followup appointment.  She stopped taking Abilify due to tremors and not feeling better.  She described that she was constantly moving for no reason.  It appears that patient may have akathisia with Abilify.  Patient also complained right facial swelling and recently seen in emergency room and her primary care physician.  She was given antibiotic however she feel is not getting better.  She complain of pain and did not slept last night.  She was scheduled to have CT scan and waiting for appointment date.  Overall her depression is better .  She is staying with her 71 year old son and 52-year-old grandchild.  She does not want to try any other medication beside Celexa Wellbutrin .  She is taking temazepam for some time she feel is not working however she believe it could be due to pain and swelling on her face.  She denies any agitation anger mood swing.  She denies any recent crying spells.  She is more social with her family member.  She denies any active or passive suicidal thoughts.  She's not drinking or using any illegal substance.  She endorse that she had good support from her family.  Current psychiatric medication Temazepam 15 mg at bedtime Celexa 40 mg daily Wellbutrin XL 300 mg daily   Medical history Patient has right facial swelling and pain and she is scheduled to have CT scan .  She is taking antibiotic which is prescribed recently.  Patient has history of chronic back pain, hyperlipidemia, coronary artery calcification and recent chest pain.  Mental status examination Patient is casually dressed and fairly groomed. She is in pain.  She has right facial swelling.  She appears tired and discomfort due to pain.  Her speech is soft clear and coherent.  Her thought process is slow but logical linear goal-directed.  She maintained fair eye  contact. She denies any active or passive suicidal thinking and homicidal thinking. There were no psychotic symptoms present at this time. She described her mood is good and her affect is bright from the past. She's alert and oriented x3. Her insight judgment and impulse control is okay.  Diagnoses Axis I bipolar disorder, rule out Maj. depressive disorder, insomnia Axis II deferred Axis III see medical history Axis IV moderate Axis V 55-60  Plan I will discontinue her Abilify due to side effects.  I will continue Celexa Wellbutrin and temazepam at bedtime.  She's not abusing her temazepam her asking for early refills.  I suggested if she started to feel paranoid and having any hallucination than she should call us we will consider Geodon however patient does not want to try any antipsychotic medication at this time.  I recommend to call us if she is any question or concern about the medication or if she feels worsening of the symptoms.  I will see her again in 2 months.  Portion of this note is generated with voice dictation software and may contain typographical error .

## 2011-07-10 NOTE — Patient Instructions (Addendum)
We will call about the scan for your face  Start the clindamycin  Stop the septra  If you have difficulty breathing go to ER, if the swelling gets worse go to ER

## 2011-07-11 ENCOUNTER — Inpatient Hospital Stay (HOSPITAL_COMMUNITY): Admission: RE | Admit: 2011-07-11 | Payer: Self-pay | Source: Ambulatory Visit

## 2011-07-11 ENCOUNTER — Ambulatory Visit (HOSPITAL_COMMUNITY)
Admission: RE | Admit: 2011-07-11 | Discharge: 2011-07-11 | Disposition: A | Discharge status: Home / Self Care | Payer: Medicare Other | Source: Ambulatory Visit | Attending: Family Medicine | Admitting: Family Medicine

## 2011-07-11 ENCOUNTER — Other Ambulatory Visit: Payer: Self-pay | Admitting: Family Medicine

## 2011-07-11 ENCOUNTER — Encounter: Payer: Self-pay | Admitting: Family Medicine

## 2011-07-11 DIAGNOSIS — R221 Localized swelling, mass and lump, neck: Secondary | ICD-10-CM | POA: Insufficient documentation

## 2011-07-11 DIAGNOSIS — R220 Localized swelling, mass and lump, head: Secondary | ICD-10-CM

## 2011-07-11 MED ORDER — IOHEXOL 300 MG/ML  SOLN
75.0000 mL | Freq: Once | INTRAMUSCULAR | Status: AC | PRN
  Administered 2011-07-11: 75 mL via INTRAVENOUS

## 2011-07-11 NOTE — Progress Notes (Signed)
  Subjective:    Patient ID: Tonya Sullivan, female    DOB: November 04, 1955, 56 y.o.   MRN: [id number]  HPI  Pt seen in ED yesterday with facial swelling and back pain, prescribed septra which has caused some itching. Today she has worsening swelling, denies difficulty breathing and able to eat and drink. No sick contacts, history of facial swelling many years ago when she had an abscess drained  Review of Systems   GEN- denies fatigue, fever, weight loss,weakness, recent illness HEENT- denies eye drainage, change in vision, nasal discharge, CVS- denies chest pain, palpitations RESP- denies SOB, cough, wheeze Neuro- denies headache,       Objective:   Physical Exam GEN- NAD, alert and oriented x3 HEENT- PERRL, EOMI, non injected sclera, pink conjunctiva, MMM, oropharynx clear, uvual midline, left sided fullness and facial swelling,no discrete abscess seen, TM clear bilat no effusion, face with mild TTP Neck- Supple, shotty LAD CVS- RRR, no murmur RESP-CTAB EXT- No edema Pulses- Radial, DP- 2+   [phone]       Assessment & Plan:

## 2011-07-11 NOTE — Assessment & Plan Note (Signed)
Concern for abscess vs cystic lesion in partoid or lymph nodes, sent for CT max facial, antibiotic changed to clindamycin No respiratory compromise  Reviewed CT scan no abscess seen, mild reactive nodes on left side, will f/u with patient and have her complete antibiotics

## 2011-07-17 NOTE — Telephone Encounter (Signed)
See previous message. Have already spoken with pt. She came in for appt and have called back to follow up on how she was feeling

## 2011-07-24 ENCOUNTER — Other Ambulatory Visit (HOSPITAL_COMMUNITY): Payer: Self-pay | Admitting: Psychiatry

## 2011-07-25 ENCOUNTER — Ambulatory Visit (INDEPENDENT_AMBULATORY_CARE_PROVIDER_SITE_OTHER): Payer: Medicare Other | Admitting: Psychiatry

## 2011-07-25 DIAGNOSIS — F329 Major depressive disorder, single episode, unspecified: Secondary | ICD-10-CM

## 2011-07-25 NOTE — Progress Notes (Signed)
Patient:  Tonya Sullivan   DOB: 09-Sep-1955  MR Number: [id number]  Location: [facility]:  [address] [place] [place],  [place], [address]  Start: Wednesday 07/25/2011 1:05 PM End: Wednesday 07/25/2011 1:50 PM  Provider/Observer:     Florencia Reasons, MSW, LCSW   Chief Complaint:      Chief Complaint  Patient presents with  . Depression    Reason For Service:     The patient was referred for services by psychiatrist Dr. Lolly Mustache due to patient experiencing anxiety, stress, depression, and mood swings. She has a long-standing history of depression beginning in adolescence. Patient also has significant trauma history. Patient also is experiencing grief and loss issues due to to the death of her husband in 10-13-10. Patient is seen today for follow up appointment.   Interventions Strategy:  Supportive therapy, grief therapy, cognitive behavioral therapy  Participation Level:   Active  Participation Quality:  Appropriate      Behavioral Observation:  Fairly Groomed, Alert, and anxious, tearful, depressed  Current Psychosocial Factors: Patient's husband died Oct 13, 2010.  Content of Session:   Reviewing symptoms, processing grief and loss issues,  normalization, identifying ways to set and maintain boundaries  Current Status:   Patient reports depressed mood, anxiety, flashbacks, sleep difficulty (sleeping 4 hours per night), memory difficulty, and loss of appetite.  She denies suicidal and homicidal ideations she also reports decreased involvement in activity and reports staying at home most of the time. She still visits her son and his family once a week.  Patient Progress:   Fair. The patient reports increased thoughts and flashbacks of the night her husband died as he died at home and patient tried to revive him using CPR. Patient shares her narrative a bad night and expresses guilt because she did not believe her husband when he said to her that he was going to die. Patient  reports constantly hearing his words recently. She blames herself for not calling 911 earlier but acknowledges that she called 911 as soon as she realized the gravity of the situation. She also questions why earlier medical tests did not reveal her husband's condition sooner. Patient reports increased fear about others' possibly dying including her mother who has fallen twice recently and her aunt and uncle who have medical issues. The patient also reports increased stress regarding interaction with her son and his girlfriend as they frequently borrow money from patient and fail to repay the loan. Patient also expresses concern about her granddaughter as she says her parents are not providing her structure and routine. Patient is cautious about her interaction with her son and his girlfriend as she fears she may be cut off from seeing her granddaughter. Patient reports she discontinued taking Wellbutrin 2 weeks ago because it causes her to feel tired. Patient agrees to schedule an earlier appointment with Dr. Lolly Mustache for medication management.   Target Goals:   1. Improving mood as evidenced by resuming normal interest in activities and increased social involvement. 2. Increase self acceptance and improve self-esteem as evidenced by patient making positive statements about self. 3.  Improve coping and relaxation techniques. 4. Process and resolve grief and loss issues.   Last Reviewed:   04/06/2011  Goals Addressed Today:    Improving coping skills, improving mood  Impression/Diagnosis:   The patient presents with a long-standing history of recurrent depression, mood swings, and a significant trauma history.  Diagnosis: Major Depressive Disorder  Diagnosis:  Axis I:  Major  Depressive Disorder           Axis II: Deferred

## 2011-07-25 NOTE — Patient Instructions (Addendum)
Discussed orally 

## 2011-07-26 ENCOUNTER — Encounter (HOSPITAL_COMMUNITY): Payer: Self-pay | Admitting: Psychiatry

## 2011-07-26 ENCOUNTER — Telehealth: Payer: Self-pay | Admitting: Family Medicine

## 2011-07-26 ENCOUNTER — Ambulatory Visit (INDEPENDENT_AMBULATORY_CARE_PROVIDER_SITE_OTHER): Payer: Medicare Other | Admitting: Family Medicine

## 2011-07-26 ENCOUNTER — Encounter: Payer: Self-pay | Admitting: Family Medicine

## 2011-07-26 ENCOUNTER — Ambulatory Visit (INDEPENDENT_AMBULATORY_CARE_PROVIDER_SITE_OTHER): Payer: Medicare Other | Admitting: Psychiatry

## 2011-07-26 VITALS — BP 114/80 | HR 79 | Resp 16 | Ht 67.0 in | Wt 207.8 lb

## 2011-07-26 VITALS — Wt 205.0 lb

## 2011-07-26 DIAGNOSIS — J449 Chronic obstructive pulmonary disease, unspecified: Secondary | ICD-10-CM

## 2011-07-26 DIAGNOSIS — K0889 Other specified disorders of teeth and supporting structures: Secondary | ICD-10-CM | POA: Insufficient documentation

## 2011-07-26 DIAGNOSIS — F329 Major depressive disorder, single episode, unspecified: Secondary | ICD-10-CM

## 2011-07-26 DIAGNOSIS — K089 Disorder of teeth and supporting structures, unspecified: Secondary | ICD-10-CM

## 2011-07-26 MED ORDER — ALBUTEROL SULFATE HFA 108 (90 BASE) MCG/ACT IN AERS: 2.0000 | INHALATION_SPRAY | RESPIRATORY_TRACT | Status: DC | PRN

## 2011-07-26 MED ORDER — CLONAZEPAM 1 MG PO TABS: ORAL_TABLET | ORAL | Status: DC

## 2011-07-26 NOTE — Progress Notes (Signed)
  Subjective:    Patient ID: Tonya Sullivan, female    DOB: June 27, 1955, 56 y.o.   MRN: [id number]  HPI   Pt presents with continued tooth pain, she has appt to see her Dentist on Monday, she thought she needed more antibiotics. Had SOB yesterday, felt very anxious, no cough, no wheeze, no CP, no fever. Continues to smoke needs refill on inhaler  Review of Systems   GEN- denies fatigue, fever, weight loss,weakness, recent illness HEENT- denies eye drainage, change in vision, nasal discharge, CVS- denies chest pain, palpitations RESP- + SOB, cough, wheeze ABD- denies N/V, change in stools, abd pain       Objective:   Physical Exam GEN- NAD, alert and oriented x3 HEENT- PERRL, EOMI, non injected sclera, pink conjunctiva, MMM, oropharynx clear, uvual midline, no facial swelling, bronken tooth on lower gumline- left side, gingival inflammation, no abscess seen Neck- Supple no LAD  CVS- RRR, no murmur RESP-CTAB EXT- No edema Pulses- Radial, DP- 2+        Assessment & Plan:

## 2011-07-26 NOTE — Assessment & Plan Note (Signed)
Broken tooth noted, pt was treated with clindamycin 2 weeks ago, negative CT face for abscess, no abscess seen today, she even went back and took remainder of bactrim though this made her itch, she will follow with her dentist, no further med

## 2011-07-26 NOTE — Progress Notes (Signed)
Chief complaint I stopped taking Wellbutrin.  It was not working.        History of presenting illness Patient is 56 year old Caucasian female who came for her followup appointment.  She stopped taking Wellbutrin due to the lack of response.  Earlier we had tried Abilify however she stopped taking due to tremors.  She continued to endorse anxiety and poor sleep.  She admitted feeling very isolated and withdrawn but denies any active or passive suicidal thoughts.  She still waiting for CT scan appointment time.  It was scheduled due to right facial swelling which is actually resolving.  She is staying with her 68 year old son and 75 year old grand child.  She feel sometime temazepam does not work very well .  She's not drinking or using any illegal substance.  Current psychiatric medication Temazepam 15 mg at bedtime Celexa 40 mg daily  Past psychiatric history Patient endorse history of depression since age 48.  She has been at least 2 psychiatric admission due to suicidal thoughts.  She denies any history of suicidal attempt however endorse endorse history of paranoia and severe depression.  Her last psychiatric admission was January 2012 due to significant depression.  In the past she had tried gabapentin, Risperdal, trazodone, Effexor, Valium, Lamictal, Klonopin and recently Wellbutrin.    Medical history Patient  had right facial swelling and she was scheduled to have CT scan .  She still waiting for the CT scan appointment.  She had history of chronic back pain, hyperlipidemia, coronary artery calcification and chest pain.  Mental status examination Patient is casually dressed and fairly groomed. She isanxious but cooperative.  She denies any active or passive suicidal thoughts or homicidal thoughts.  Her speech is slow and soft.  She has poverty of thought content .  There were no delusion or paranoia however she feel very anxious.  Her attention and concentration is fair.  She maintained fair  eye contact.  There were no tremors or shakes present at this time.  She's alert and oriented x3.  Her insight judgment and pulse control is okay.  Diagnoses Axis I  Major depressive disorder, rule out bipolar disorder  Axis II deferred Axis III see medical history Axis IV moderate Axis V 55-60  Plan I will discontinue Wellbutrin due to lack of response.  In the past she had a good response with Klonopin however she was taking more than usual and we have to stop the Klonopin.  We discussed in detail that we can restart Klonopin however she will not take more than prescribed dosage and quantity .  I will also discontinue temazepam since she does not require 2 benzodiazepine.  Patient promised agreed with the plan.  We will start Klonopin 1 mg she can take half to one tablet at bedtime which can also help her anxiety and sleep.  We discussed in detail the risks and benefits of medication including potential withdrawal tolerance and dependency issues related to benzodiazepine.  Time spent 30 minutes.  I will see her again in 4 weeks.  Portion of this note is generated with voice dictation software and may contain typographical error .

## 2011-07-26 NOTE — Assessment & Plan Note (Signed)
Appears stable today, albuterol refilled, I think her SOB yesterday was probably anxiety

## 2011-07-26 NOTE — Patient Instructions (Addendum)
Gargle warm salt water Use the anti-inflammatory F/u with your dentist Use the inhaler as needed for wheezing or cough  I recommend you quit smoking! F/U 4 months

## 2011-07-27 NOTE — Telephone Encounter (Signed)
Called and left message for pt to return call.  

## 2011-08-01 NOTE — Telephone Encounter (Signed)
New referral made

## 2011-08-08 ENCOUNTER — Ambulatory Visit (HOSPITAL_COMMUNITY): Payer: Self-pay | Admitting: Psychiatry

## 2011-08-13 ENCOUNTER — Telehealth: Payer: Self-pay | Admitting: Family Medicine

## 2011-08-14 ENCOUNTER — Ambulatory Visit (INDEPENDENT_AMBULATORY_CARE_PROVIDER_SITE_OTHER): Payer: Medicare Other | Admitting: Psychiatry

## 2011-08-14 DIAGNOSIS — F331 Major depressive disorder, recurrent, moderate: Secondary | ICD-10-CM

## 2011-08-14 DIAGNOSIS — F329 Major depressive disorder, single episode, unspecified: Secondary | ICD-10-CM

## 2011-08-14 NOTE — Progress Notes (Signed)
Patient:  Tonya Sullivan   DOB: 1955/11/10  MR Number: [id number]  Location: [facility]:  [address] [place] [place],  [place], [address]  Start: Tuesday 08/14/2011 11:05 AM End: Tuesday 08/14/2011 11:50 AM  Provider/Observer:     Florencia Reasons, MSW, LCSW   Chief Complaint:      Chief Complaint  Patient presents with  . Depression    Reason For Service:     The patient was referred for services by psychiatrist Dr. Lolly Mustache due to patient experiencing anxiety, stress, depression, and mood swings. She has a long-standing history of depression beginning in adolescence. Patient also has significant trauma history. Patient also is experiencing grief and loss issues due to to the death of her husband in 10-08-10. Patient is seen today for follow up appointment.   Interventions Strategy:  Supportive therapy, grief therapy, cognitive behavioral therapy  Participation Level:   Active  Participation Quality:  Appropriate      Behavioral Observation:  Fairly Groomed, Alert, and anxious, tearful at times, less depressed  Current Psychosocial Factors: Patient's husband died Oct 08, 2010.  Content of Session:   Reviewing symptoms, processing grief and loss issues,  normalization, identifying ways to improve self-care and self nurture  Current Status:   Patient reports improved sleep pattern and increased involvement in activities. She continues to experience sadness along with grief and loss issues related to her deceased husband.  Patient Progress:   Fair. The patient reports sleeping better since starting another medication as prescribed by Dr. Lolly Mustache.  She reports increased involvement in activities perform light chores around her house and increased social involvement including visiting her mother as well as maintaining weekly visitation with her son and his family. Patient reports seeing  her brother recently. She reports managing the situation well although it was awkward. Patient  expresses concern about her aunt who recently fainted and was hospitalized briefly. Patient reports increased thoughts about her deceased husband as the first anniversary of his death approaches.. she expresses a desire to visit his grave. She continues to miss him and reports crying more in recent weeks. Therapist works with patient to process grief and loss issues. Patient expresses anger with her husband as well as his medical providers and the instruction she received just prior to his death regarding his care. She also continues to experience some guilt. Patient continues to struggle with poor motivation and self-esteem at times. Therapist works with patient to explore ways to improve self-care and self nurture as well as ways to start to expand her social involvement beyond family.   Target Goals:   1. Improving mood as evidenced by resuming normal interest in activities and increased social involvement. 2. Increase self acceptance and improve self-esteem as evidenced by patient making positive statements about self. 3.  Improve coping and relaxation techniques. 4. Process and resolve grief and loss issues.   Last Reviewed:   04/06/2011  Goals Addressed Today:    Improving coping skills, improving mood, processing grief and loss issues  Impression/Diagnosis:   The patient presents with a long-standing history of recurrent depression, mood swings, and a significant trauma history.  Diagnosis: Major Depressive Disorder  Diagnosis:  Axis I:  Major Depressive Disorder           Axis II: Deferred

## 2011-08-14 NOTE — Patient Instructions (Signed)
Discussed orally 

## 2011-08-23 ENCOUNTER — Ambulatory Visit (INDEPENDENT_AMBULATORY_CARE_PROVIDER_SITE_OTHER): Payer: Medicare Other | Admitting: Psychiatry

## 2011-08-23 ENCOUNTER — Encounter (HOSPITAL_COMMUNITY): Payer: Self-pay | Admitting: Psychiatry

## 2011-08-23 VITALS — Wt 205.0 lb

## 2011-08-23 DIAGNOSIS — F339 Major depressive disorder, recurrent, unspecified: Secondary | ICD-10-CM

## 2011-08-23 MED ORDER — CLONAZEPAM 1 MG PO TABS: ORAL_TABLET | ORAL | Status: DC

## 2011-08-23 MED ORDER — RISPERIDONE 1 MG PO TABS: 1.0000 mg | ORAL_TABLET | Freq: Every day | ORAL | Status: DC

## 2011-08-23 NOTE — Progress Notes (Signed)
Chief complaint I am feeling more anxious depressed and started to hear chattering .          History of presenting illness Patient is 56 year old Caucasian female who came for her followup appointment.  On her last visit we started Klonopin along with Celexa to help her sleep since Wellbutrin and temazepam was not working.  Patient started to feel more depressed anxious and having hallucination.  She endorsed chattering in her ear .  She is concerned about her family.  Her aunt fell who lives in [place] and may need hospitalization.  Her nephew sustained ankle injury .  She's very concerned about her family.  She continued to grief about her husband who died accidentally last year.  She feel current medicine not working.  She sleeping only one to 2 hours.  She admitted crying spells getting easily irritable and frustrated.  She feels tired most of the time.  She denies any active or passive suicidal thoughts but endorse chronic feeling of hopelessness and helplessness.  There were no tremors or side effects of medication.  She is taking Klonopin 1 mg at bedtime with limited response.  She denies any auditory hallucination.  She is frustrated as her CT scan was normal but she continues to have facial swelling and jaw pain.  She's not drinking or using any illegal substance. She is staying with her 67 year old son and 75 year old grand child.   Current psychiatric medication Klonopin 1 mg at bedtime  Celexa 40 mg daily  Past psychiatric history Patient endorse history of depression since age 61.  She has been at least 2 psychiatric admission due to suicidal thoughts.  She denies any history of suicidal attempt however endorse endorse history of paranoia and severe depression.  Her last psychiatric admission was January 2012 due to significant depression.  In the past she had tried gabapentin, Risperdal, trazodone, Effexor, Valium, Lamictal, Klonopin and recently Wellbutrin.    Medical history Patient   had right facial swelling, her recent CT scan was normal.  She's taking pain medication.  She has history of history of chronic back pain, hyperlipidemia, coronary artery calcification and chest pain.  Mental status examination Patient is casually dressed and fairly groomed. She appears anxious and depressed.  Her speech is slow with poverty of thought content.  She described her mood is sad and depressed and her affect is constricted.  She endorse chattering in her year but there were no command auditory hallucination.  She admitted some paranoia but there were no delusion or obsessions present at this time.  There were no tremors or shakes.  She denies any active or passive suicidal thoughts or homicidal thoughts.  She denies any visual hallucination.  Her attention and concentration is fair.  She's alert oriented x3.  There were no flight of idea or loose association.  Her insight judgment and impulse control is okay.  Diagnoses Axis I  Major depressive disorder, rule out bipolar disorder  Axis II deferred Axis III see medical history Axis IV moderate Axis V 55-60  Plan I review her chart, psychosocial stressor, medication response to the medication.  In the past she had a good response with Risperdal however she stopped taking due to weight gain.  Despite stopping Risperdal she has not lost much weight yet I recommend to restart Risperdal which helped her in the past.  After some discussion patient agreed to restart.  We will continue to monitor her weight regularly.  I will continue Klonopin  and Celexa.  I will start Risperdal 1 mg at bedtime.  I explain in detail the risks and benefits of medication including metabolic side effects, EPS and sedation.  I recommend to call us if she has no improvement or any question or concern about the medication.  I will see her again in 3-4 weeks.  Time spent 30 minutes.  Portion of this note is generated with voice dictation software and may contain  typographical error.

## 2011-08-24 ENCOUNTER — Encounter (HOSPITAL_COMMUNITY): Payer: Self-pay | Admitting: Cardiology

## 2011-08-28 ENCOUNTER — Ambulatory Visit (INDEPENDENT_AMBULATORY_CARE_PROVIDER_SITE_OTHER): Payer: Medicare Other | Admitting: Psychiatry

## 2011-08-28 DIAGNOSIS — F329 Major depressive disorder, single episode, unspecified: Secondary | ICD-10-CM

## 2011-08-28 NOTE — Patient Instructions (Signed)
Discussed orally 

## 2011-08-28 NOTE — Progress Notes (Signed)
Patient:  Tonya Sullivan   DOB: March 10, 1955  MR Number: [id number]  Location: [facility]:  [address] [place] [place],  [place], [address]  Start: Tuesday 08/28/2011 1:00 PM End: Tuesday 08/28/2011 1:45 PM  Provider/Observer:     Florencia Reasons, MSW, LCSW   Chief Complaint:      Chief Complaint  Patient presents with  . Depression  . Anxiety    Reason For Service:     The patient was referred for services by psychiatrist Dr. Lolly Mustache due to patient experiencing anxiety, stress, depression, and mood swings. She has a long-standing history of depression beginning in adolescence. Patient also has significant trauma history. Patient also is experiencing grief and loss issues due to to the death of her husband in 10-10-10. Patient is seen today for follow up appointment.   Interventions Strategy:  Supportive therapy, grief therapy, cognitive behavioral therapy  Participation Level:   Active  Participation Quality:  Appropriate      Behavioral Observation:  Fairly Groomed, Alert, and anxious, tearful at times, less depressed  Current Psychosocial Factors: The first anniversary of patient's husband's death his next week.  Content of Session:   Reviewing symptoms, processing grief and loss issues,  normalization, reinforcing patient's efforts to set and maintain boundaries  Current Status:   Patient reports continued improved sleep pattern and social contact with family. She continues to experience sadness along with grief and loss issues related to her deceased husband.  Patient Progress:   Fair. The patient reports maintaining involvement with family and staying overnight with her mother as well as with her son and his family. However, patient reports becoming more withdrawn and conversations saying she just hasn't been feeling comfortable around people. She states she has been at a loss for words. She is experiencing increased loneliness and sadness related to the loss of her  husband. The first anniversary of his death is 09/14/2011. Therapist works with patient to identify ways patient can acknowledge and honor  her husband on that day. Patient plans to write a letter. She expresses disappointment that she will be unable to his spread his ashes in the mountains that  weekend as she had planned due to financial issues. Patient reports she continues to feel lost without her husband. Patient has been able to successfully set and maintain boundaries with her family members regarding their problems and sites several examples doing this with her sister, her mother, and her son.   Target Goals:   1. Improving mood as evidenced by resuming normal interest in activities and increased social involvement. 2. Increase self acceptance and improve self-esteem as evidenced by patient making positive statements about self. 3.  Improve coping and relaxation techniques. 4. Process and resolve grief and loss issues.   Last Reviewed:   04/06/2011  Goals Addressed Today:    Improving coping skills, improving mood, processing grief and loss issues  Impression/Diagnosis:   The patient presents with a long-standing history of recurrent depression, mood swings, and a significant trauma history.  Diagnosis: Major Depressive Disorder  Diagnosis:  Axis I:  Major Depressive Disorder           Axis II: Deferred

## 2011-09-07 ENCOUNTER — Other Ambulatory Visit (HOSPITAL_COMMUNITY): Payer: Self-pay | Admitting: *Deleted

## 2011-09-07 DIAGNOSIS — F329 Major depressive disorder, single episode, unspecified: Secondary | ICD-10-CM

## 2011-09-10 MED ORDER — CITALOPRAM HYDROBROMIDE 40 MG PO TABS: 40.0000 mg | ORAL_TABLET | Freq: Every day | ORAL | Status: DC

## 2011-09-11 ENCOUNTER — Ambulatory Visit (HOSPITAL_COMMUNITY): Payer: Self-pay | Admitting: Psychiatry

## 2011-09-12 NOTE — Telephone Encounter (Signed)
Called and left message for pt to return call.  

## 2011-09-25 ENCOUNTER — Ambulatory Visit (HOSPITAL_COMMUNITY): Payer: Self-pay | Admitting: Psychiatry

## 2011-10-02 ENCOUNTER — Ambulatory Visit (HOSPITAL_COMMUNITY): Payer: Self-pay | Admitting: Psychiatry

## 2011-10-04 ENCOUNTER — Ambulatory Visit (INDEPENDENT_AMBULATORY_CARE_PROVIDER_SITE_OTHER): Payer: Medicare Other | Admitting: Psychiatry

## 2011-10-04 ENCOUNTER — Encounter (HOSPITAL_COMMUNITY): Payer: Self-pay | Admitting: Psychiatry

## 2011-10-04 VITALS — Wt 199.0 lb

## 2011-10-04 DIAGNOSIS — F339 Major depressive disorder, recurrent, unspecified: Secondary | ICD-10-CM

## 2011-10-04 DIAGNOSIS — F329 Major depressive disorder, single episode, unspecified: Secondary | ICD-10-CM

## 2011-10-04 MED ORDER — ZIPRASIDONE HCL 40 MG PO CAPS: 40.0000 mg | ORAL_CAPSULE | Freq: Two times a day (BID) | ORAL | Status: DC

## 2011-10-04 MED ORDER — CLONAZEPAM 1 MG PO TABS: ORAL_TABLET | ORAL | Status: DC

## 2011-10-04 NOTE — Progress Notes (Signed)
Chief complaint I feel very nervous and scared.  I miss my husband.  I stop taking Risperdal because I start eating more.  I don't want to gain weight.            History of presenting illness Patient came for her followup appointment.  She stopped taking Risperdal after she noticed increased appetite.  Though she has lost weight she is afraid to take any medication that cause weight gain.  She endorse increased anxiety depression and paranoia in past few days.  She was visiting her family in [place] and have a good time however since she moved back she's feeling more distressed anxious and depressed.  She is very scared lived by herself.  She is actually going to stay with her brother who lives in a [place].  She admitted some time hearing voices of her deceased husband.  She does not want go to grocery store by herself.  She's not drinking or using any illegal substance.  She endorse anhedonia and decrease socialization.  She sleeps only a few hours.  She has not seeing therapist since she was out of town.  She denies any side effects of Celexa .  She is taking Klonopin .  She does not ask for early refills .  She's not drinking or using any illegal substance.  Current psychiatric medication Klonopin 1 mg at bedtime  Celexa 40 mg daily  Past psychiatric history Patient endorse history of depression since age 4.  She has been at least 2 psychiatric admission due to suicidal thoughts.  She denies any history of suicidal attempt however endorse endorse history of paranoia and severe depression.  Her last psychiatric admission was January 2012 due to significant depression.  In the past she had tried gabapentin, Risperdal, trazodone, Abilify , Effexor, Valium, Lamictal, Klonopin and recently Wellbutrin.    Medical history Patient had right facial swelling however her CT scan was normal.  She's taking pain medication.  She has history of history of chronic back pain, hyperlipidemia, coronary artery  calcification and chest pain.  Mental status examination Patient is casually dressed and fairly groomed. She appears anxious, tense and depressed.  Her speech is slow with poverty of thought content.  She described her mood is sad and depressed and her affect is constricted.  She endorse hallucination of her deceased husband but denies any active or passive suicidal thoughts or homicidal thoughts.  She endorse paranoia and does not feel comfortable around people.  Her thought processes slow.  There were no tremors or shakes.  She denies any visual hallucination.  Her attention and concentration is fair.  She's alert oriented x3.  There were no flight of idea or loose association.  Her insight judgment and impulse control is okay.  Diagnoses Axis I  Major depressive disorder, rule out bipolar disorder  Axis II deferred Axis III see medical history Axis IV moderate Axis V 55-60  Plan I review her chart, psychosocial stressor, medication response to the medication.  I will discontinue Risperdal .  She had a good response with Abilify and Risperdal but patient developed side effects and weight gain.  She never tried Geodon .  I will recommend to try Geodon 40 mg twice a day.  I explain in detail the risk and benefits of medication.  I encourage her to see therapist .  We talk about safety plan that anytime having suicidal thoughts or homicidal thoughts then she need to call 911 or go to local emergency  room.  Information provided about crisis hotline .  I will see her again in 2 weeks.  Portion of this note is generated with voice dictation software and may contain typographical error.

## 2011-10-12 ENCOUNTER — Other Ambulatory Visit (HOSPITAL_COMMUNITY): Payer: Self-pay | Admitting: *Deleted

## 2011-10-12 ENCOUNTER — Telehealth: Payer: Self-pay | Admitting: Family Medicine

## 2011-10-12 ENCOUNTER — Ambulatory Visit (INDEPENDENT_AMBULATORY_CARE_PROVIDER_SITE_OTHER): Payer: Medicare Other | Admitting: Psychiatry

## 2011-10-12 DIAGNOSIS — F329 Major depressive disorder, single episode, unspecified: Secondary | ICD-10-CM

## 2011-10-12 DIAGNOSIS — F339 Major depressive disorder, recurrent, unspecified: Secondary | ICD-10-CM

## 2011-10-12 NOTE — Progress Notes (Addendum)
Patient:  Tonya Sullivan   DOB: 06-02-1955  MR Number: [id number]  Location: [facility]:  [address] [place] [place],  [place], [address]  Start: Friday 10/12/2011 2:05 PM End: Friday 10/12/2011 2:35 PM  Provider/Observer:     Florencia Reasons, MSW, LCSW   Chief Complaint:      Chief Complaint  Patient presents with  . Depression  . Anxiety    Reason For Service:     The patient was referred for services by psychiatrist Dr. Lolly Mustache due to patient experiencing anxiety, stress, depression, and mood swings. She has a long-standing history of depression beginning in adolescence. Patient also has significant trauma history. Patient also is experiencing grief and loss issues due to to the death of her husband in Oct 20, 2010. Patient is seen today for follow up appointment.   Interventions Strategy:  Supportive therapy, grief therapy, cognitive behavioral therapy  Participation Level:   Active  Participation Quality:  Appropriate      Behavioral Observation:  Fairly Groomed, Alert, and anxious, depressed  Current Psychosocial Factors: The first anniversary of patient's husband's death was last month and their thirty-fourth wedding anniversary is this month. Patient also had recent visits with her aunt who has serious health issues and her sister who has family issues.  Content of Session:   Reviewing symptoms, processing grief and loss issues, identifying ways to improve self-care, identifying ways to set and maintain boundaries  Current Status:   Patient reports sleep difficulty, increased depressed mood, poor motivation, and racing thoughts.  Patient Progress:   Fair. The patient reports visiting her aunt and uncle in [place] as her aunts health has continued to decline. She also reports staying in [place] for 2 weeks with her sister. Patient reports being in [place] was very stressful due to to her sister's family issues. The trip also triggered memories of patient's trauma  history. Patient also reports increased thoughts about deceased husband and reports being very lonely. Therapist works with patient to process grief and loss issues and normalize feelings. She expresses anger with her husband for leaving her after 34 years. Their anniversary is October 26. She states still feeling lost and admits been critical of self since she is still grieving over her husaband a year after his death. Patient is contemplating spreading the remainder of his ashes in the mountains and visiting the cemetery where the other ashes are in [place]. She has made contact with her son and his family and reports staying overnight at times. However, she feels overwhelmed as her 74-year-old granddaughter is very active. Patient reports loss of appetite and sometimes going 4 or 5 days without eating. She expresses fear of weight gain as well and has disliking food as her mother has an unhealthy relationship with food per patient's report. Therapist works with patient to identify ways to improve self-care regarding nutrition and to identify healthy ways of thinking about food.  Target Goals:   1. Improving mood as evidenced by resuming normal interest in activities and increased social involvement. 2. Increase self acceptance and improve self-esteem as evidenced by patient making positive statements about self. 3.  Improve coping and relaxation techniques. 4. Process and resolve grief and loss issues.   Last Reviewed:   04/06/2011  Goals Addressed Today:    Improving coping skills, improving mood, processing grief and loss issues  Impression/Diagnosis:   The patient presents with a long-standing history of recurrent depression, mood swings, and a significant trauma history.  Diagnosis: Major Depressive  Disorder  Diagnosis:  Axis I:  Major Depressive Disorder           Axis II: Deferred

## 2011-10-12 NOTE — Telephone Encounter (Signed)
Form completed.

## 2011-10-12 NOTE — Telephone Encounter (Signed)
Back pain was included on form but not her psychiatric diagnosis, she may not qualify, please let pt know

## 2011-10-12 NOTE — Patient Instructions (Signed)
Discussed orally 

## 2011-10-12 NOTE — Telephone Encounter (Signed)
Spoke with patient and she is asking that faxed PCS form from [facility] be filled out.  Stated that back problems were not put on last form as diagnosis.  Is it ok to fill out this form?

## 2011-10-12 NOTE — Telephone Encounter (Signed)
Called and left message for patient to return call.  

## 2011-10-15 ENCOUNTER — Other Ambulatory Visit (HOSPITAL_COMMUNITY): Payer: Self-pay | Admitting: *Deleted

## 2011-10-15 ENCOUNTER — Telehealth (HOSPITAL_COMMUNITY): Payer: Self-pay

## 2011-10-15 DIAGNOSIS — F339 Major depressive disorder, recurrent, unspecified: Secondary | ICD-10-CM

## 2011-10-15 MED ORDER — ZIPRASIDONE HCL 40 MG PO CAPS: 40.0000 mg | ORAL_CAPSULE | Freq: Two times a day (BID) | ORAL | Status: DC

## 2011-10-15 NOTE — Telephone Encounter (Signed)
Message copied by Newt Lukes on Mon Oct 15, 2011 10:39 AM ------      Message from: Kathryne Sharper T      Created: Mon Oct 15, 2011 10:04 AM      Regarding: RE: authorization       It has done. I send you email.      ----- Message -----         From: Newt Lukes         Sent: 10/15/2011   9:00 AM           To: Cleotis Nipper, MD      Subject: authorization                                            Hello Dr. Lolly Mustache, I am on the phone with [employer] for Ms.[place] I received a fax with that MEB # and now I am getting getting auth.            Thanks Marylene Land

## 2011-10-15 NOTE — Telephone Encounter (Signed)
Spoke to [employer] at [phone].  Prior authorization for Geodon is approved until 12/31/2012.  We will inform patient to pick up the prescription from the pharmacy.

## 2011-10-16 ENCOUNTER — Other Ambulatory Visit (HOSPITAL_COMMUNITY): Payer: Self-pay | Admitting: Psychiatry

## 2011-10-25 ENCOUNTER — Encounter (HOSPITAL_COMMUNITY): Payer: Self-pay | Admitting: Psychiatry

## 2011-10-25 ENCOUNTER — Ambulatory Visit (INDEPENDENT_AMBULATORY_CARE_PROVIDER_SITE_OTHER): Payer: Medicare Other | Admitting: Psychiatry

## 2011-10-25 VITALS — Wt 200.0 lb

## 2011-10-25 DIAGNOSIS — F339 Major depressive disorder, recurrent, unspecified: Secondary | ICD-10-CM

## 2011-10-25 DIAGNOSIS — F329 Major depressive disorder, single episode, unspecified: Secondary | ICD-10-CM

## 2011-10-25 MED ORDER — CLONAZEPAM 1 MG PO TABS: ORAL_TABLET | ORAL | Status: DC

## 2011-10-25 MED ORDER — CITALOPRAM HYDROBROMIDE 40 MG PO TABS: 40.0000 mg | ORAL_TABLET | Freq: Every day | ORAL | Status: DC

## 2011-10-25 MED ORDER — ZIPRASIDONE HCL 40 MG PO CAPS: 40.0000 mg | ORAL_CAPSULE | Freq: Two times a day (BID) | ORAL | Status: DC

## 2011-10-25 NOTE — Progress Notes (Signed)
Chief complaint I doing better on Geodon.  I'm less paranoid and less anxious.   History of presenting illness Patient came for her followup appointment.  On her last visit we started her on Geodon as patient does not want to take Risperdal due to weight gain.  She is taking Geodon 40 mg twice a day.  She's feeling much better with Geodon.  She sleeping better.  She has increased energy .  [place] and had a good time.  She doesn't have any tremors shakes .  She still takes Klonopin at bedtime.  She admitted some weight gain but she believe because increased appetite and she was more relaxed.   She has agitation anger mood swing.she had a good support from her family.  She's not drinking or using any illegal substance.  She still misses her deceased husband however she is having better.  Current psychiatric medication Klonopin 1 mg at bedtime  Celexa 40 mg daily Geodon 40 mg twice a  Past psychiatric history Patient endorse history of depression since age 16.  She has been at least 2 psychiatric admission due to suicidal thoughts.  She denies any history of suicidal attempt however endorse endorse history of paranoia and severe depression.  Her last psychiatric admission was January 2012 due to significant depression.  In the past she had tried gabapentin, Risperdal, trazodone, Abilify , Effexor, Valium, Lamictal, Klonopin and recently Wellbutrin.    Medical history She has history of history of chronic back pain, hyperlipidemia, coronary artery calcification and chest pain.  Mental status examination Patient is casually dressed and fairly groomed. She appears calm cooperative and relevant in conversation.  She maintained good eye contact.  Her speech is soft clear and coherent.  Her thought processes logical goal-directed.  She described her mood is good and her affect is improved from the past.  She denies any auditory or visual hallucination.  She denies any  active or passive suicidal thoughts or homicidal thoughts.  There is some paranoia but there were no delusion present.  Her attention and concentration is better.  She's alert and oriented x3.  Her insight judgment and impulse control is okay.  Diagnoses Axis I  Major depressive disorder, rule out bipolar disorder  Axis II deferred Axis III see medical history Axis IV moderate Axis V 55-60  Plan I will continue Geodon 40 mg twice a day along with Celexa and Klonopin.  I explained risks and benefits of medication and recommend to call us if she is any question or concern or if she feels worsening of the symptom.  I also informed that she will see a new psychiatrist on her next appointment since I moving full-time [place].  Reassurance given.  Patient will see Dr. Walker in 6 weeks.  Portion of this note is generated with voice dictation software and may contain typographical error. And 

## 2011-10-26 ENCOUNTER — Ambulatory Visit (INDEPENDENT_AMBULATORY_CARE_PROVIDER_SITE_OTHER): Payer: Medicare Other | Admitting: Psychiatry

## 2011-10-26 DIAGNOSIS — F329 Major depressive disorder, single episode, unspecified: Secondary | ICD-10-CM
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
## 2011-10-26 NOTE — Patient Instructions (Signed)
Discussed orally 

## 2011-11-03 ENCOUNTER — Other Ambulatory Visit: Payer: Self-pay | Admitting: Family Medicine

## 2011-11-09 ENCOUNTER — Ambulatory Visit (INDEPENDENT_AMBULATORY_CARE_PROVIDER_SITE_OTHER): Payer: Medicare Other | Admitting: Psychiatry

## 2011-11-09 DIAGNOSIS — F329 Major depressive disorder, single episode, unspecified: Secondary | ICD-10-CM
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
## 2011-11-09 NOTE — Patient Instructions (Addendum)
Discussed orally 

## 2011-11-12 ENCOUNTER — Other Ambulatory Visit: Payer: Self-pay | Admitting: Neurology

## 2011-11-12 ENCOUNTER — Other Ambulatory Visit: Payer: Self-pay | Admitting: Family Medicine

## 2011-11-12 DIAGNOSIS — M549 Dorsalgia, unspecified: Secondary | ICD-10-CM

## 2011-11-16 ENCOUNTER — Ambulatory Visit (HOSPITAL_COMMUNITY)
Admission: RE | Admit: 2011-11-16 | Discharge: 2011-11-16 | Disposition: A | Discharge status: Home / Self Care | Payer: Medicare Other | Source: Ambulatory Visit | Attending: Neurology | Admitting: Neurology

## 2011-11-16 DIAGNOSIS — M47814 Spondylosis without myelopathy or radiculopathy, thoracic region: Secondary | ICD-10-CM | POA: Insufficient documentation

## 2011-11-16 DIAGNOSIS — M549 Dorsalgia, unspecified: Secondary | ICD-10-CM

## 2011-11-22 ENCOUNTER — Telehealth: Payer: Self-pay | Admitting: Family Medicine

## 2011-11-22 NOTE — Telephone Encounter (Signed)
This has been sent multiple times.

## 2011-11-23 ENCOUNTER — Ambulatory Visit (HOSPITAL_COMMUNITY): Payer: Self-pay | Admitting: Psychiatry

## 2011-11-23 NOTE — Telephone Encounter (Signed)
Will fax again to number requested

## 2011-11-27 ENCOUNTER — Ambulatory Visit: Payer: Medicare Other | Admitting: Family Medicine

## 2011-12-04 ENCOUNTER — Other Ambulatory Visit (HOSPITAL_COMMUNITY): Payer: Self-pay | Admitting: *Deleted

## 2011-12-04 DIAGNOSIS — F339 Major depressive disorder, recurrent, unspecified: Secondary | ICD-10-CM

## 2011-12-04 NOTE — Telephone Encounter (Signed)
Pt seen monthly by Dr Lolly Mustache.  I have not seen her and am not scheduled to see her until 12/24/2011.

## 2011-12-07 ENCOUNTER — Ambulatory Visit (INDEPENDENT_AMBULATORY_CARE_PROVIDER_SITE_OTHER): Payer: Medicare Other | Admitting: Psychiatry

## 2011-12-07 ENCOUNTER — Telehealth (HOSPITAL_COMMUNITY): Payer: Self-pay | Admitting: Psychiatry

## 2011-12-07 DIAGNOSIS — F329 Major depressive disorder, single episode, unspecified: Secondary | ICD-10-CM

## 2011-12-10 NOTE — Telephone Encounter (Signed)
Phone message completed in the phone message section. Cloudy consciousness and disinhibition from the benzodiazepines that are too often prescribed where low dose antipsychotic or Neurontin could be used quite effectively without those effects.)

## 2011-12-11 NOTE — Patient Instructions (Signed)
Discussed orally 
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
## 2011-12-14 ENCOUNTER — Encounter: Payer: Self-pay | Admitting: Family Medicine

## 2011-12-14 ENCOUNTER — Ambulatory Visit (HOSPITAL_COMMUNITY)
Admission: RE | Admit: 2011-12-14 | Discharge: 2011-12-14 | Disposition: A | Discharge status: Home / Self Care | Payer: Medicare Other | Source: Ambulatory Visit | Attending: Family Medicine | Admitting: Family Medicine

## 2011-12-14 ENCOUNTER — Ambulatory Visit (INDEPENDENT_AMBULATORY_CARE_PROVIDER_SITE_OTHER): Payer: Medicare Other | Admitting: Family Medicine

## 2011-12-14 VITALS — BP 120/78 | HR 79 | Resp 15 | Ht 67.0 in | Wt 199.1 lb

## 2011-12-14 DIAGNOSIS — R109 Unspecified abdominal pain: Secondary | ICD-10-CM

## 2011-12-14 DIAGNOSIS — E785 Hyperlipidemia, unspecified: Secondary | ICD-10-CM

## 2011-12-14 LAB — CBC WITH DIFFERENTIAL/PLATELET
Basophils Absolute: 0 10*3/uL (ref 0.0–0.1)
Basophils Relative: 1 % (ref 0–1)
Eosinophils Relative: 3 % (ref 0–5)
HCT: 46.1 % — ABNORMAL HIGH (ref 36.0–46.0)
Hemoglobin: 15.3 g/dL — ABNORMAL HIGH (ref 12.0–15.0)
MCHC: 33.2 g/dL (ref 30.0–36.0)
MCV: 93.7 fL (ref 78.0–100.0)
Monocytes Absolute: 0.5 10*3/uL (ref 0.1–1.0)
Monocytes Relative: 8 % (ref 3–12)
Neutro Abs: 3.5 10*3/uL (ref 1.7–7.7)
RDW: 13.4 % (ref 11.5–15.5)

## 2011-12-14 LAB — COMPREHENSIVE METABOLIC PANEL
ALT: 11 U/L (ref 0–35)
AST: 14 U/L (ref 0–37)
Alkaline Phosphatase: 69 U/L (ref 39–117)
BUN: 16 mg/dL (ref 6–23)
Calcium: 9.5 mg/dL (ref 8.4–10.5)
Creat: 1.01 mg/dL (ref 0.50–1.10)
Total Bilirubin: 0.1 mg/dL — ABNORMAL LOW (ref 0.3–1.2)

## 2011-12-14 LAB — POCT URINALYSIS DIPSTICK
Leukocytes, UA: NEGATIVE
Spec Grav, UA: 1.025
pH, UA: 6.5

## 2011-12-14 NOTE — Progress Notes (Signed)
  Subjective:    Patient ID: Tonya Sullivan, female    DOB: Oct 28, 1955, 56 y.o.   MRN: [id number]  HPI  Abd pain x 1 week, sharp pains on and off. Improves with pain meds, back has also been hurting. Mild nasuea no emesis. Bowels have moved some this week. Denies dysuria, hematuria, or blood in stool.   Review of Systems  GEN- denies fatigue, fever, weight loss,weakness, recent illness HEENT- denies eye drainage, change in vision, nasal discharge, CVS- denies chest pain, palpitations RESP- denies SOB, cough, wheeze ABD- + N/ no V, change in stools, +abd pain GU- denies dysuria, hematuria, dribbling, incontinence MSK- + joint pain, muscle aches, injury       Objective:   Physical Exam GEN- NAD, alert and oriented x3 HEENT- PERRL, EOMI, non injected sclera, pink conjunctiva, MMM, oropharynx clear CVS- RRR, no murmur RESP-CTAB ABS-NABS,soft,TPP lower quadrants, no rebound, no guarding , no CVA tenderness Back- TTP lumbar spine EXT- No edema Pulses- Radial, DP- 2+        Assessment & Plan:

## 2011-12-14 NOTE — Patient Instructions (Signed)
Labs to be done stat Go to ER if pain does not improve Continue current medications  F/U Feb for routine visit-

## 2011-12-14 NOTE — Assessment & Plan Note (Signed)
Unclear cause, no acute abdomen on exam, UA neg, will send for culture has pt has had hematuria and UTI in past, KUB shows constipation, no obstruction,Lipase, CBC, CMET unremarkable Will have her increase water fiber, trial of miralax for constipation she is on chronic opiates, if she worsens go to ER for CT scan Note s/p hysterectomy,no discharge  Discussed with pt, given red flags

## 2011-12-17 ENCOUNTER — Telehealth: Payer: Self-pay | Admitting: Family Medicine

## 2011-12-19 ENCOUNTER — Emergency Department (HOSPITAL_COMMUNITY)
Admission: EM | Admit: 2011-12-19 | Discharge: 2011-12-19 | Disposition: A | Discharge status: Home / Self Care | Payer: Medicare Other | Attending: Emergency Medicine | Admitting: Emergency Medicine

## 2011-12-19 ENCOUNTER — Emergency Department (HOSPITAL_COMMUNITY): Payer: Medicare Other

## 2011-12-19 ENCOUNTER — Encounter (HOSPITAL_COMMUNITY): Payer: Self-pay | Admitting: Emergency Medicine

## 2011-12-19 DIAGNOSIS — Z8679 Personal history of other diseases of the circulatory system: Secondary | ICD-10-CM | POA: Insufficient documentation

## 2011-12-19 DIAGNOSIS — E785 Hyperlipidemia, unspecified: Secondary | ICD-10-CM | POA: Insufficient documentation

## 2011-12-19 DIAGNOSIS — Z79899 Other long term (current) drug therapy: Secondary | ICD-10-CM | POA: Insufficient documentation

## 2011-12-19 DIAGNOSIS — Z8659 Personal history of other mental and behavioral disorders: Secondary | ICD-10-CM | POA: Insufficient documentation

## 2011-12-19 DIAGNOSIS — J449 Chronic obstructive pulmonary disease, unspecified: Secondary | ICD-10-CM | POA: Insufficient documentation

## 2011-12-19 DIAGNOSIS — G8929 Other chronic pain: Secondary | ICD-10-CM | POA: Insufficient documentation

## 2011-12-19 DIAGNOSIS — F17200 Nicotine dependence, unspecified, uncomplicated: Secondary | ICD-10-CM | POA: Insufficient documentation

## 2011-12-19 DIAGNOSIS — R209 Unspecified disturbances of skin sensation: Secondary | ICD-10-CM | POA: Insufficient documentation

## 2011-12-19 DIAGNOSIS — F319 Bipolar disorder, unspecified: Secondary | ICD-10-CM | POA: Insufficient documentation

## 2011-12-19 DIAGNOSIS — Z7982 Long term (current) use of aspirin: Secondary | ICD-10-CM | POA: Insufficient documentation

## 2011-12-19 DIAGNOSIS — J4489 Other specified chronic obstructive pulmonary disease: Secondary | ICD-10-CM | POA: Insufficient documentation

## 2011-12-19 DIAGNOSIS — M5430 Sciatica, unspecified side: Secondary | ICD-10-CM

## 2011-12-19 MED ORDER — HYDROMORPHONE HCL PF 1 MG/ML IJ SOLN
1.0000 mg | Freq: Once | INTRAMUSCULAR | Status: AC
  Administered 2011-12-19: 1 mg via INTRAVENOUS
  Filled 2011-12-19: qty 1

## 2011-12-19 MED ORDER — OXYCODONE-ACETAMINOPHEN 5-325 MG PO TABS: 1.0000 | ORAL_TABLET | Freq: Four times a day (QID) | ORAL | Status: AC | PRN

## 2011-12-19 MED ORDER — SODIUM CHLORIDE 0.9 % IV SOLN: INTRAVENOUS | Status: DC

## 2011-12-19 MED ORDER — ONDANSETRON HCL 4 MG/2ML IJ SOLN
4.0000 mg | Freq: Once | INTRAMUSCULAR | Status: AC
  Administered 2011-12-19: 4 mg via INTRAVENOUS
  Filled 2011-12-19: qty 2

## 2011-12-19 MED ORDER — HYDROMORPHONE HCL 2 MG PO TABS: 2.0000 mg | ORAL_TABLET | ORAL | Status: DC | PRN

## 2011-12-19 MED ORDER — SODIUM CHLORIDE 0.9 % IV BOLUS (SEPSIS)
250.0000 mL | Freq: Once | INTRAVENOUS | Status: AC
  Administered 2011-12-19: 250 mL via INTRAVENOUS

## 2011-12-19 NOTE — ED Notes (Signed)
Pt in MRI at this time 

## 2011-12-19 NOTE — ED Notes (Signed)
Pt states she has DDD And back surgery x5. Suppers from chronic pain related to these issues. Pt states over past few days pain has been higher than normal and her current pain meds are not handling the pain.

## 2011-12-19 NOTE — ED Provider Notes (Signed)
History     CSN: [id number]  Arrival date & time 12/19/11  1645   First MD Initiated Contact with Patient 12/19/11 1718      Chief Complaint  Patient presents with  . Back Pain    (Consider location/radiation/quality/duration/timing/severity/associated sxs/prior treatment) Patient is a 56 y.o. female presenting with back pain. The history is provided by the patient.  Back Pain  Associated symptoms include numbness. Pertinent negatives include no chest pain, no fever, no abdominal pain, no dysuria and no weakness.   patient with long-standing history of back problems. Has had fusions in the lumbar area in the past. Patient has not had any back problems until recently last week and a half to increased right-sided back pain radiating down the right leg with numbness of on top of her foot. No significant weakness did state she had some urine incontinence as well. She is followed by neurosurgery locally Dr. Danielle Dess. Patient is also followed by Dr. do and call for neurology. Patient states the pain is a 10 out of 10 sharp and burning in nature. It radiates into the back of the right leg.  Past Medical History  Diagnosis Date  . COPD (chronic obstructive pulmonary disease)   . Depression   . Chronic back pain   . Hyperlipidemia   . Anxiety   . Bipolar disorder, unspecified   . Carotid artery calcification     Past Surgical History  Procedure Date  . Appendectomy   . Tonsillectomy   . Cholecystectomy   . Back surgery     x 5  . Lump left breast     removed- benign  . Abdominal hysterectomy     tubal pregnancy  . Inguinal hernia repair   . Stomach surgery     ?holes in esophgous    Family History  Problem Relation Age of Onset  . Hypertension Mother   . Heart disease Mother   . Cancer Father     lung cancer  . Hypertension Brother   . Alcohol abuse Brother   . Bipolar disorder Brother   . Alcohol abuse Sister   . Bipolar disorder Sister   . Alcohol abuse Brother   .  Bipolar disorder Brother   . Alcohol abuse Sister   . Bipolar disorder Sister   . Alcohol abuse Brother   . Bipolar disorder Brother   . Bipolar disorder Brother   . Bipolar disorder Maternal Aunt   . Bipolar disorder Paternal Aunt   . Bipolar disorder Maternal Uncle   . Bipolar disorder Paternal Uncle   . Bipolar disorder Maternal Grandfather   . Alcohol abuse Maternal Grandfather   . Bipolar disorder Maternal Grandmother   . Alcohol abuse Maternal Grandmother   . Bipolar disorder Paternal Grandfather   . Alcohol abuse Paternal Grandfather   . Bipolar disorder Paternal Grandmother   . Alcohol abuse Paternal Grandmother   . Bipolar disorder Maternal Uncle   . Heart attack Mother   . Heart attack Father     History  Substance Use Topics  . Smoking status: Current Every Day Smoker -- 1.0 packs/day    Types: Cigarettes  . Smokeless tobacco: Never Used  . Alcohol Use: No    OB History    Grav Para Term Preterm Abortions TAB SAB Ect Mult Living                  Review of Systems  Constitutional: Negative for fever.  HENT: Negative for neck pain.  Eyes: Negative for visual disturbance.  Respiratory: Negative for shortness of breath.   Cardiovascular: Negative for chest pain and leg swelling.  Gastrointestinal: Negative for nausea, vomiting and abdominal pain.  Genitourinary: Negative for dysuria and difficulty urinating.  Musculoskeletal: Positive for back pain.  Neurological: Positive for numbness. Negative for weakness.    Allergies  Codeine; Cyclobenzaprine; Darvocet; Abilify; Adhesive; Iodine; Levofloxacin; Prednisone; and Sulfa antibiotics  Home Medications   Current Outpatient Rx  Name  Route  Sig  Dispense  Refill  . ALBUTEROL SULFATE HFA 108 (90 BASE) MCG/ACT IN AERS   Inhalation   Inhale 2 puffs into the lungs every 6 (six) hours as needed. For shortness of breath         . ASPIRIN 81 MG PO TBEC   Oral   Take 1 tablet (81 mg total) by mouth daily.  Swallow whole.   30 tablet   12   . CLONAZEPAM 1 MG PO TABS      Take 1/2 to 1 tab at bed time   30 tablet   0     No early refill   . MELOXICAM 7.5 MG PO TABS   Oral   Take 7.5 mg by mouth 2 (two) times daily.         . OXYCODONE-ACETAMINOPHEN 10-325 MG PO TABS   Oral   Take 1 tablet by mouth every 8 (eight) hours as needed. For pain         . PRAVASTATIN SODIUM 40 MG PO TABS   Oral   Take 40 mg by mouth at bedtime.         Marland Kitchen ZIPRASIDONE HCL 40 MG PO CAPS   Oral   Take 1 capsule (40 mg total) by mouth 2 (two) times daily with a meal.   60 capsule   0   . HYDROMORPHONE HCL 2 MG PO TABS   Oral   Take 1 tablet (2 mg total) by mouth every 4 (four) hours as needed for pain.   20 tablet   0   . OXYCODONE-ACETAMINOPHEN 5-325 MG PO TABS   Oral   Take 1-2 tablets by mouth every 6 (six) hours as needed for pain.   20 tablet   0     BP 140/78  Pulse 99  Temp 97.9 F (36.6 C) (Oral)  Resp 18  Ht 5\' 7"  (1.702 m)  Wt 199 lb (90.266 kg)  BMI 31.17 kg/m2  SpO2 96%  Physical Exam  Nursing note and vitals reviewed. Constitutional: She is oriented to person, place, and time. She appears well-developed and well-nourished. No distress.  HENT:  Head: Normocephalic and atraumatic.  Mouth/Throat: Oropharynx is clear and moist.  Eyes: Conjunctivae normal and EOM are normal. Pupils are equal, round, and reactive to light.  Neck: Normal range of motion. Neck supple.  Cardiovascular: Normal rate, regular rhythm and normal heart sounds.   Pulmonary/Chest: Effort normal and breath sounds normal. No respiratory distress. She has no wheezes. She has no rales.  Abdominal: Soft. Bowel sounds are normal. There is no tenderness.  Musculoskeletal: Normal range of motion. She exhibits no tenderness.  Neurological: She is alert and oriented to person, place, and time. She has normal reflexes.       Except for numbness in the L5 distribution of the right leg. Mostly between the  first and second toe.  Skin: Skin is warm. No erythema.    ED Course  Procedures (including critical care time)  Labs Reviewed -  No data to display Mr Lumbar Spine Wo Contrast  12/19/2011  *RADIOLOGY REPORT*  Clinical Data: 56 year old female with recurrent back pain. Incontinence.  Pain and burning radiating to the right lower extremity.  Multiple prior back surgeries.  MRI LUMBAR SPINE WITHOUT CONTRAST  Technique:  Multiplanar and multiecho pulse sequences of the lumbar spine were obtained without intravenous contrast.  Comparison: Abdominal radiograph 12/14/2011.  Lumbar MRI 02/23/08.  Findings:  The same numbering system is used as on the comparison. Previous L4-L5 and L5-L1 posterior and interbody fusion sequelae. Transpedicular hardware is been removed at L5 and S1.  Posterior and interbody fusion has been extended cephalad to L3-L4 since the prior exam. Overall stable vertebral height and alignment. No marrow edema or evidence of acute osseous abnormality. Postoperative changes to the posterior paraspinal soft tissues. Mild susceptibility artifact related to the fusion hardware.   Visualized lower thoracic spinal cord is normal with conus medularis at L2.  Prominent right renal collecting system, similar to the prior study.  No right hydroureter is evident.  Otherwise negative visualized abdominal viscera.  T11-T12:  Negative.  T12-L1:  Negative.  L1-L2:  New mild disc bulge eccentric to the left.  No significant stenosis.  L2-L3:  New disc desiccation and circumferential disc bulging eccentric to the left.  Mild to moderate facet and ligament flavum hypertrophy.  New trace fluid in both facet joints.  Overall mild to moderate spinal stenosis, new from the prior study.  Borderline to mild L2 foraminal stenosis greater on the left also is new.  L3-L4:  Interval fusion and decompression.  Resolved spinal stenosis seen on prior, now with widely patent thecal sac.  No stenosis.  L4-L5:  Stable sequelae  of fusion and decompression with no stenosis.  L5-S1:  Stable sequelae of fusion and decompression with no stenosis.  IMPRESSION: 1.  L3-L4 to L5-S1 fusion and decompression with no adverse features. The L3-L4 surgery is new since the 2010 comparison. 2.  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   Original Report Authenticated By: Erskine Speed, M.D.      1. Sciatica       MDM  Patient with long-standing back problems. Has had fusion in the lumbar area in the past is followed by neurosurgery locally. We get a half ago patient had exacerbation of her back pain radiating down her right leg with numbness in the L5 distribution. This consistent with sciatica. MRI results are as above no significant acute findings. MRI was done because patient stated that she's been having some incontinence issues based on the MRI there does not appear to be a cauda equina cord compression. Patient pain improved in the emergency department hydromorphone IV will be discharged home with her renewal of her Percocet and some more hydromorphone orally to supplement that. She will followup with her neurosurgeon.       Shelda Jakes, MD 12/19/11 2022

## 2011-12-19 NOTE — ED Notes (Signed)
Pt c/o back pain and goes down rt leg. Pt states it is a burning sensation.

## 2011-12-20 ENCOUNTER — Ambulatory Visit: Payer: Medicare Other | Admitting: Family Medicine

## 2011-12-21 NOTE — Telephone Encounter (Signed)
Her stomach labs were normal, She is to get her cholesterol before her visit in Feb

## 2011-12-21 NOTE — Telephone Encounter (Signed)
Patient aware that results received.  She would like to know how labs look.  Transferred to reschedule missed appointment.

## 2011-12-23 ENCOUNTER — Telehealth: Payer: Self-pay | Admitting: Family Medicine

## 2011-12-23 NOTE — Telephone Encounter (Signed)
Pt called, states since ED visit has had cough and mild diarrhea < 5 times a day, wanted theraflu called in. Advised viral illness will run course, no red flags, tylenol for fever, immodium if diarrhea worsens, fluids rest, she recheduled her cancelled appt Note seen in ED 4 days ago, so if flu out of window for tamiflu

## 2011-12-24 ENCOUNTER — Telehealth (HOSPITAL_COMMUNITY): Payer: Self-pay | Admitting: Psychiatry

## 2011-12-24 ENCOUNTER — Emergency Department (HOSPITAL_COMMUNITY)
Admission: EM | Admit: 2011-12-24 | Discharge: 2011-12-24 | Disposition: A | Discharge status: Home / Self Care | Payer: Medicare Other | Attending: Emergency Medicine | Admitting: Emergency Medicine

## 2011-12-24 ENCOUNTER — Ambulatory Visit (INDEPENDENT_AMBULATORY_CARE_PROVIDER_SITE_OTHER): Payer: Medicare Other | Admitting: Family Medicine

## 2011-12-24 ENCOUNTER — Emergency Department (HOSPITAL_COMMUNITY): Payer: Medicare Other

## 2011-12-24 ENCOUNTER — Telehealth: Payer: Self-pay | Admitting: Family Medicine

## 2011-12-24 ENCOUNTER — Encounter: Payer: Self-pay | Admitting: Family Medicine

## 2011-12-24 ENCOUNTER — Ambulatory Visit (INDEPENDENT_AMBULATORY_CARE_PROVIDER_SITE_OTHER): Payer: Medicare Other | Admitting: Psychiatry

## 2011-12-24 ENCOUNTER — Encounter (HOSPITAL_COMMUNITY): Payer: Self-pay | Admitting: Psychiatry

## 2011-12-24 ENCOUNTER — Encounter (HOSPITAL_COMMUNITY): Payer: Self-pay | Admitting: Emergency Medicine

## 2011-12-24 VITALS — BP 132/70 | HR 72 | Wt 199.0 lb

## 2011-12-24 VITALS — BP 140/90 | HR 88 | Resp 16 | Ht 67.0 in | Wt 197.0 lb

## 2011-12-24 DIAGNOSIS — R109 Unspecified abdominal pain: Secondary | ICD-10-CM

## 2011-12-24 DIAGNOSIS — F329 Major depressive disorder, single episode, unspecified: Secondary | ICD-10-CM

## 2011-12-24 DIAGNOSIS — M545 Low back pain, unspecified: Secondary | ICD-10-CM | POA: Insufficient documentation

## 2011-12-24 DIAGNOSIS — J4489 Other specified chronic obstructive pulmonary disease: Secondary | ICD-10-CM | POA: Insufficient documentation

## 2011-12-24 DIAGNOSIS — Z7982 Long term (current) use of aspirin: Secondary | ICD-10-CM | POA: Insufficient documentation

## 2011-12-24 DIAGNOSIS — Z79899 Other long term (current) drug therapy: Secondary | ICD-10-CM | POA: Insufficient documentation

## 2011-12-24 DIAGNOSIS — F411 Generalized anxiety disorder: Secondary | ICD-10-CM | POA: Insufficient documentation

## 2011-12-24 DIAGNOSIS — G4700 Insomnia, unspecified: Secondary | ICD-10-CM

## 2011-12-24 DIAGNOSIS — F17200 Nicotine dependence, unspecified, uncomplicated: Secondary | ICD-10-CM | POA: Insufficient documentation

## 2011-12-24 DIAGNOSIS — F419 Anxiety disorder, unspecified: Secondary | ICD-10-CM

## 2011-12-24 DIAGNOSIS — E785 Hyperlipidemia, unspecified: Secondary | ICD-10-CM | POA: Insufficient documentation

## 2011-12-24 DIAGNOSIS — R52 Pain, unspecified: Secondary | ICD-10-CM

## 2011-12-24 DIAGNOSIS — F32A Depression, unspecified: Secondary | ICD-10-CM

## 2011-12-24 DIAGNOSIS — F319 Bipolar disorder, unspecified: Secondary | ICD-10-CM

## 2011-12-24 DIAGNOSIS — Z90710 Acquired absence of both cervix and uterus: Secondary | ICD-10-CM | POA: Insufficient documentation

## 2011-12-24 DIAGNOSIS — R110 Nausea: Secondary | ICD-10-CM | POA: Insufficient documentation

## 2011-12-24 DIAGNOSIS — J449 Chronic obstructive pulmonary disease, unspecified: Secondary | ICD-10-CM | POA: Insufficient documentation

## 2011-12-24 DIAGNOSIS — R197 Diarrhea, unspecified: Secondary | ICD-10-CM | POA: Insufficient documentation

## 2011-12-24 DIAGNOSIS — Z9889 Other specified postprocedural states: Secondary | ICD-10-CM | POA: Insufficient documentation

## 2011-12-24 DIAGNOSIS — F3289 Other specified depressive episodes: Secondary | ICD-10-CM | POA: Insufficient documentation

## 2011-12-24 DIAGNOSIS — Z720 Tobacco use: Secondary | ICD-10-CM

## 2011-12-24 DIAGNOSIS — Z9089 Acquired absence of other organs: Secondary | ICD-10-CM | POA: Insufficient documentation

## 2011-12-24 DIAGNOSIS — F339 Major depressive disorder, recurrent, unspecified: Secondary | ICD-10-CM

## 2011-12-24 DIAGNOSIS — I6529 Occlusion and stenosis of unspecified carotid artery: Secondary | ICD-10-CM | POA: Insufficient documentation

## 2011-12-24 DIAGNOSIS — G8929 Other chronic pain: Secondary | ICD-10-CM | POA: Insufficient documentation

## 2011-12-24 DIAGNOSIS — R1031 Right lower quadrant pain: Secondary | ICD-10-CM | POA: Insufficient documentation

## 2011-12-24 LAB — CBC WITH DIFFERENTIAL/PLATELET
Basophils Absolute: 0 10*3/uL (ref 0.0–0.1)
Basophils Relative: 0 % (ref 0–1)
Eosinophils Relative: 2 % (ref 0–5)
Lymphocytes Relative: 36 % (ref 12–46)
MCHC: 33.7 g/dL (ref 30.0–36.0)
MCV: 92.3 fL (ref 78.0–100.0)
Neutro Abs: 3.4 10*3/uL (ref 1.7–7.7)
Platelets: 262 10*3/uL (ref 150–400)
RDW: 13.4 % (ref 11.5–15.5)
WBC: 6 10*3/uL (ref 4.0–10.5)

## 2011-12-24 LAB — POCT URINALYSIS DIPSTICK
Bilirubin, UA: NEGATIVE
Leukocytes, UA: NEGATIVE
Nitrite, UA: NEGATIVE
Urobilinogen, UA: 0.2
pH, UA: 6

## 2011-12-24 LAB — COMPREHENSIVE METABOLIC PANEL
BUN: 7 mg/dL (ref 6–23)
CO2: 28 mEq/L (ref 19–32)
Calcium: 9.3 mg/dL (ref 8.4–10.5)
Creatinine, Ser: 1.08 mg/dL (ref 0.50–1.10)
GFR calc Af Amer: 65 mL/min — ABNORMAL LOW (ref >90)
GFR calc non Af Amer: 56 mL/min — ABNORMAL LOW (ref >90)
Glucose, Bld: 95 mg/dL (ref 70–99)

## 2011-12-24 LAB — URINALYSIS, ROUTINE W REFLEX MICROSCOPIC
Ketones, ur: NEGATIVE mg/dL
Leukocytes, UA: NEGATIVE
Protein, ur: NEGATIVE mg/dL
Urobilinogen, UA: 0.2 mg/dL (ref 0.0–1.0)

## 2011-12-24 MED ORDER — SODIUM CHLORIDE 0.9 % IV SOLN
1000.0000 mL | Freq: Once | INTRAVENOUS | Status: AC
  Administered 2011-12-24: 1000 mL via INTRAVENOUS

## 2011-12-24 MED ORDER — GABAPENTIN 100 MG PO CAPS: ORAL_CAPSULE | ORAL | Status: DC

## 2011-12-24 MED ORDER — HYDROMORPHONE HCL PF 1 MG/ML IJ SOLN
1.0000 mg | Freq: Once | INTRAMUSCULAR | Status: AC
  Administered 2011-12-24: 1 mg via INTRAVENOUS
  Filled 2011-12-24: qty 1

## 2011-12-24 MED ORDER — ONDANSETRON HCL 4 MG/2ML IJ SOLN
4.0000 mg | Freq: Once | INTRAMUSCULAR | Status: AC
  Administered 2011-12-24: 4 mg via INTRAVENOUS
  Filled 2011-12-24: qty 2

## 2011-12-24 MED ORDER — CITALOPRAM HYDROBROMIDE 40 MG PO TABS: 40.0000 mg | ORAL_TABLET | Freq: Every day | ORAL | Status: DC

## 2011-12-24 MED ORDER — IOHEXOL 300 MG/ML  SOLN: 100.0000 mL | Freq: Once | INTRAMUSCULAR | Status: DC | PRN

## 2011-12-24 MED ORDER — ZIPRASIDONE HCL 40 MG PO CAPS: 40.0000 mg | ORAL_CAPSULE | Freq: Three times a day (TID) | ORAL | Status: DC

## 2011-12-24 MED ORDER — CLONAZEPAM 1 MG PO TABS: 0.5000 mg | ORAL_TABLET | Freq: Every day | ORAL | Status: DC

## 2011-12-24 MED ORDER — SODIUM CHLORIDE 0.9 % IV SOLN: 1000.0000 mL | INTRAVENOUS | Status: DC

## 2011-12-24 MED ORDER — IOHEXOL 300 MG/ML  SOLN
100.0000 mL | Freq: Once | INTRAMUSCULAR | Status: AC | PRN
  Administered 2011-12-24: 100 mL via INTRAVENOUS

## 2011-12-24 NOTE — Progress Notes (Signed)
Chief complaint I doing better on Geodon.  I'm less paranoid and less anxious.   History of presenting illness Patient came for her followup appointment.  On her last visit we started her on Geodon as patient does not want to take Risperdal due to weight gain.  She is taking Geodon 40 mg twice a day.  She's feeling much better with Geodon.  She sleeping better.  She has increased energy .  Last week she went to [place] with her son and had a good time.  She doesn't have any tremors shakes .  She still takes Klonopin at bedtime.  She admitted some weight gain but she believe because increased appetite and she was more relaxed.   She has agitation anger mood swing.she had a good support from her family.  She's not drinking or using any illegal substance.  She still misses her deceased husband however she is having better.  Current psychiatric medication Klonopin 1 mg at bedtime  Celexa 40 mg daily Geodon 40 mg twice a  Past psychiatric history Patient endorse history of depression since age 56.  She has been at least 2 psychiatric admission due to suicidal thoughts.  She denies any history of suicidal attempt however endorse endorse history of paranoia and severe depression.  Her last psychiatric admission was January 2012 due to significant depression.  In the past she had tried gabapentin, Risperdal, trazodone, Abilify , Effexor, Valium, Lamictal, Klonopin and recently Wellbutrin.    Medical history She has history of history of chronic back pain, hyperlipidemia, coronary artery calcification and chest pain.  Mental status examination Patient is casually dressed and fairly groomed. She appears calm cooperative and relevant in conversation.  She maintained good eye contact.  Her speech is soft clear and coherent.  Her thought processes logical goal-directed.  She described her mood is good and her affect is improved from the past.  She denies any auditory or visual hallucination.  She denies any  active or passive suicidal thoughts or homicidal thoughts.  There is some paranoia but there were no delusion present.  Her attention and concentration is better.  She's alert and oriented x3.  Her insight judgment and impulse control is okay.  Diagnoses Axis I  Major depressive disorder, rule out bipolar disorder  Axis II deferred Axis III see medical history Axis IV moderate Axis V 55-60  Plan I will continue Geodon 40 mg twice a day along with Celexa and Klonopin.  I explained risks and benefits of medication and recommend to call us if she is any question or concern or if she feels worsening of the symptom.  I also informed that she will see a new psychiatrist on her next appointment since I moving full-time [place].  Reassurance given.  Patient will see Dr. Dan Humphreys in 6 weeks.  Portion of this note is generated with voice dictation software and may contain typographical error. And

## 2011-12-24 NOTE — Addendum Note (Signed)
Addended by: Mike Craze on: 12/24/2011 01:44 PM   Modules accepted: Orders

## 2011-12-24 NOTE — ED Notes (Signed)
RN at bedside

## 2011-12-24 NOTE — Patient Instructions (Addendum)
Go to ER for CT abdomen Continue current medications Drink plenty of fluids If your CT scan is normal, i will send you to Dr.Rourk Urine sample normal

## 2011-12-24 NOTE — ED Notes (Signed)
Patient with c/o right lower abdominal pain that radiates to lower back. Sent by Dr Jeanice Lim for further eval.

## 2011-12-24 NOTE — Telephone Encounter (Signed)
Phone message completed in the phone message section.  

## 2011-12-24 NOTE — Patient Instructions (Addendum)
Increase the Fish oil to twice a day  Switch to Neurontin for the Klonopin and for your back.  Have a happy holiday  Strongly consider attending at least 6 Alanon Meetings to help you learn about how your helping others to the exclusion of helping yourself is actually hurting yourself and is actually an addiction to fixing others and that you need to work the 12 Step to Happiness through the [employer]. Al-Anon Family Groups could be helpful with how to deal with substance abusing family and friends. Or your own issues of being in victim role.  There are only 40 Alanon Family Group meetings a week here in [place].  Online are current listing of those meetings @ [email or web address]  There are [employer].  Search on line and there you can learn the format and can access the schedule for yourself.  Their number is [phone]  The Saturday AM meeting is awesome. 10 at [employer] Rm #3.   The Tuesday noon meeting is awesome too.  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

## 2011-12-24 NOTE — Progress Notes (Signed)
  Subjective:    Patient ID: Tonya Sullivan, female    DOB: 1955/07/04, 56 y.o.   MRN: [id number]  HPI Pt here with recurrent abdominal pain, Saturday had diarrhea and cough now resolved, no blood in urine, pressure before urinating but denies dysuria. She states that the pain is getting worse. Of note she was in the ER one week ago for her back pain however did not mention her abdominal pain because she did not want to think she was a hypochondriac. Denies nausea or vomiting currently. She's taking her chronic pain medications but they're not helping her abdominal pain.   Review of Systems   GEN- denies fatigue, fever, weight loss,weakness, recent illness HEENT- denies eye drainage, change in vision, nasal discharge, CVS- denies chest pain, palpitations RESP- denies SOB, cough, wheeze ABD- denies N/V, change in stools,+ abd pain GU- denies dysuria, hematuria, dribbling, incontinence MSK- + joint pain, muscle aches, injury Neuro- denies headache, dizziness, syncope, seizure activity      Objective:   Physical Exam GEN- NAD, alert and oriented x3 HEENT- PERRL, EOMI, non injected sclera, pink conjunctiva, MMM, oropharynx clear CVS- RRR, no murmur RESP-CTAB ABS-NABS,soft,TPP lower quadrants, no rebound, no guarding , no CVA tenderness Back- TTP lumbar spine EXT- No edema Pulses- Radial, DP- 2+         Assessment & Plan:

## 2011-12-24 NOTE — Telephone Encounter (Signed)
Patient in for appt.  

## 2011-12-24 NOTE — ED Provider Notes (Signed)
History   This chart was scribed for Tonya Kras, MD by Charolett Bumpers, ED Scribe. The patient was seen in room APA19/APA19. Patient's care was started at 1014.  CSN: [id number] Arrival date & time 12/24/11  4098  First MD Initiated Contact with Patient 12/24/11 1014     Chief Complaint  Patient presents with  . Abdominal Pain    The history is provided by the patient. No language interpreter was used.  Tonya Sullivan is a 56 y.o. female who presents to the Emergency Department complaining of constant, severe RLQ abdominal pain that started 3 weeks ago. She states the abdominal pain radiates to her lower back. She states an x-ray showed that her bowels were backed up, but denies having any trouble with her BM's. She reports one episode of associated diarrhea 2 days ago and nausea. She denies any vomiting. She also complains of chronic right lower back pain in which she was last seen here a week ago. She has a h/o 5 back surgeries and sees Dr. Gerilyn Pilgrim for her back issues. She saw Dr. Jeanice Lim this morning who wants an CT scan preformed today for further evaluation. She denies any modifying factors. She also has a h/o tubal pregnancy, appendectomy, cholecystectomy, abdominal hysterectomy and stomach surgery.    Past Medical History  Diagnosis Date  . COPD (chronic obstructive pulmonary disease)   . Depression   . Chronic back pain   . Hyperlipidemia   . Anxiety   . Bipolar disorder, unspecified   . Carotid artery calcification     Past Surgical History  Procedure Date  . Appendectomy   . Tonsillectomy   . Cholecystectomy   . Back surgery     x 5  . Lump left breast     removed- benign  . Abdominal hysterectomy     tubal pregnancy  . Inguinal hernia repair   . Stomach surgery     ?holes in esophgous    Family History  Problem Relation Age of Onset  . Hypertension Mother   . Heart disease Mother   . Cancer Father     lung cancer  . Hypertension Brother   . Alcohol  abuse Brother   . Bipolar disorder Brother   . Alcohol abuse Sister   . Bipolar disorder Sister   . Alcohol abuse Brother   . Bipolar disorder Brother   . Alcohol abuse Sister   . Bipolar disorder Sister   . Alcohol abuse Brother   . Bipolar disorder Brother   . Bipolar disorder Brother   . Bipolar disorder Maternal Aunt   . Bipolar disorder Paternal Aunt   . Bipolar disorder Maternal Uncle   . Bipolar disorder Paternal Uncle   . Bipolar disorder Maternal Grandfather   . Alcohol abuse Maternal Grandfather   . Bipolar disorder Maternal Grandmother   . Alcohol abuse Maternal Grandmother   . Bipolar disorder Paternal Grandfather   . Alcohol abuse Paternal Grandfather   . Bipolar disorder Paternal Grandmother   . Alcohol abuse Paternal Grandmother   . Bipolar disorder Maternal Uncle   . Heart attack Mother   . Heart attack Father     History  Substance Use Topics  . Smoking status: Current Every Day Smoker -- 1.0 packs/day    Types: Cigarettes  . Smokeless tobacco: Never Used  . Alcohol Use: No    OB History    Grav Para Term Preterm Abortions TAB SAB Ect Mult Living  Review of Systems  Gastrointestinal: Positive for nausea, abdominal pain and diarrhea. Negative for vomiting.  Musculoskeletal: Positive for back pain.  All other systems reviewed and are negative.    Allergies  Codeine; Cyclobenzaprine; Darvocet; Abilify; Adhesive; Iodine; Levofloxacin; Prednisone; and Sulfa antibiotics  Home Medications   Current Outpatient Rx  Name  Route  Sig  Dispense  Refill  . ALBUTEROL SULFATE HFA 108 (90 BASE) MCG/ACT IN AERS   Inhalation   Inhale 2 puffs into the lungs every 6 (six) hours as needed. For shortness of breath         . ASPIRIN 81 MG PO TBEC   Oral   Take 1 tablet (81 mg total) by mouth daily. Swallow whole.   30 tablet   12   . CLONAZEPAM 1 MG PO TABS      Take 1/2 to 1 tab at bed time   30 tablet   0     No early refill   .  HYDROMORPHONE HCL 2 MG PO TABS   Oral   Take 1 tablet (2 mg total) by mouth every 4 (four) hours as needed for pain.   20 tablet   0   . MELOXICAM 7.5 MG PO TABS   Oral   Take 7.5 mg by mouth 2 (two) times daily.         . OXYCODONE-ACETAMINOPHEN 10-325 MG PO TABS   Oral   Take 1 tablet by mouth every 8 (eight) hours as needed. For pain         . OXYCODONE-ACETAMINOPHEN 5-325 MG PO TABS   Oral   Take 1-2 tablets by mouth every 6 (six) hours as needed for pain.   20 tablet   0   . PRAVASTATIN SODIUM 40 MG PO TABS   Oral   Take 40 mg by mouth at bedtime.         Marland Kitchen ZIPRASIDONE HCL 40 MG PO CAPS   Oral   Take 1 capsule (40 mg total) by mouth 2 (two) times daily with a meal.   60 capsule   0     There were no vitals taken for this visit.  Physical Exam  Nursing note and vitals reviewed. Constitutional: She appears well-developed and well-nourished. No distress.  HENT:  Head: Normocephalic and atraumatic.  Right Ear: External ear normal.  Left Ear: External ear normal.  Eyes: Conjunctivae normal are normal. Right eye exhibits no discharge. Left eye exhibits no discharge. No scleral icterus.  Neck: Neck supple. No tracheal deviation present.  Cardiovascular: Normal rate, regular rhythm and intact distal pulses.   Pulmonary/Chest: Effort normal and breath sounds normal. No stridor. No respiratory distress. She has no wheezes. She has no rales.  Abdominal: Soft. Bowel sounds are normal. She exhibits no distension and no mass. There is tenderness. There is no rebound and no guarding.       Tenderness to palpation in the suprapubic abdomen.   Musculoskeletal: She exhibits tenderness. She exhibits no edema.       Tenderness to palpation over the right sided sacral region, no deformities.   Neurological: She is alert. She has normal strength. No sensory deficit. Cranial nerve deficit:  no gross defecits noted. She exhibits normal muscle tone. She displays no seizure  activity. Coordination normal.  Skin: Skin is warm and dry. No rash noted.  Psychiatric: She has a normal mood and affect.    ED Course  Procedures (including critical care time)  DIAGNOSTIC STUDIES: Oxygen  Saturation is 91% on room air, adequate by my interpretation.    COORDINATION OF CARE:  10:25-Discussed planned course of treatment with the patient including pain and nausea medication, IV fluids, a CT scan of her abdomen, blood work and UA, who is agreeable at this time.   10:30-Medication Orders: Hydromorphone (Dilaudid) injection 1 mg-once; Ondansetron (Zofran) injection 4 mg-once; 0.9% sodium chloride infusion 1,000 mL-once.   Results for orders placed during the hospital encounter of 12/24/11  COMPREHENSIVE METABOLIC PANEL      Component Value Range   Sodium 138  135 - 145 mEq/L   Potassium 4.6  3.5 - 5.1 mEq/L   Chloride 102  96 - 112 mEq/L   CO2 28  19 - 32 mEq/L   Glucose, Bld 95  70 - 99 mg/dL   BUN 7  6 - 23 mg/dL   Creatinine, Ser 4.09  0.50 - 1.10 mg/dL   Calcium 9.3  8.4 - 81.1 mg/dL   Total Protein 7.4  6.0 - 8.3 g/dL   Albumin 3.8  3.5 - 5.2 g/dL   AST 18  0 - 37 U/L   ALT 10  0 - 35 U/L   Alkaline Phosphatase 66  39 - 117 U/L   Total Bilirubin 0.2 (*) 0.3 - 1.2 mg/dL   GFR calc non Af Amer 56 (*) >90 mL/min   GFR calc Af Amer 65 (*) >90 mL/min  LIPASE, BLOOD      Component Value Range   Lipase 20  11 - 59 U/L  URINALYSIS, ROUTINE W REFLEX MICROSCOPIC      Component Value Range   Color, Urine YELLOW  YELLOW   APPearance CLEAR  CLEAR   Specific Gravity, Urine <1.005 (*) 1.005 - 1.030   pH 6.0  5.0 - 8.0   Glucose, UA NEGATIVE  NEGATIVE mg/dL   Hgb urine dipstick NEGATIVE  NEGATIVE   Bilirubin Urine NEGATIVE  NEGATIVE   Ketones, ur NEGATIVE  NEGATIVE mg/dL   Protein, ur NEGATIVE  NEGATIVE mg/dL   Urobilinogen, UA 0.2  0.0 - 1.0 mg/dL   Nitrite NEGATIVE  NEGATIVE   Leukocytes, UA NEGATIVE  NEGATIVE  CBC WITH DIFFERENTIAL      Component Value Range    WBC 6.0  4.0 - 10.5 K/uL   RBC 4.82  3.87 - 5.11 MIL/uL   Hemoglobin 15.0  12.0 - 15.0 g/dL   HCT 91.4  78.2 - 95.6 %   MCV 92.3  78.0 - 100.0 fL   MCH 31.1  26.0 - 34.0 pg   MCHC 33.7  30.0 - 36.0 g/dL   RDW 21.3  08.6 - 57.8 %   Platelets 262  150 - 400 K/uL   Neutrophils Relative 56  43 - 77 %   Neutro Abs 3.4  1.7 - 7.7 K/uL   Lymphocytes Relative 36  12 - 46 %   Lymphs Abs 2.2  0.7 - 4.0 K/uL   Monocytes Relative 6  3 - 12 %   Monocytes Absolute 0.3  0.1 - 1.0 K/uL   Eosinophils Relative 2  0 - 5 %   Eosinophils Absolute 0.1  0.0 - 0.7 K/uL   Basophils Relative 0  0 - 1 %   Basophils Absolute 0.0  0.0 - 0.1 K/uL   Ct Abdomen Pelvis W Contrast  12/24/2011  *RADIOLOGY REPORT*  Clinical Data: Fall pain, past history COPD, appendectomy, cholecystectomy, back surgery, inguinal hernia repair, left breast surgery  CT ABDOMEN AND PELVIS  WITH CONTRAST  Technique:  Multidetector CT imaging of the abdomen and pelvis was performed following the standard protocol during bolus administration of intravenous contrast. Sagittal and coronal MPR images reconstructed from axial data set.  Contrast: OMNIPAQUE IOHEXOL 300 MG/ML  SOLN Dilute oral contrast.  Comparison: 03/06/2011  Findings: Atelectasis at base of right middle lobe. Minimal focal fatty infiltration of liver adjacent the falciform fissure. Mild central intrahepatic biliary dilatation post cholecystectomy, CBD 9 mm diameter. Liver, spleen, pancreas, kidneys, and adrenal glands otherwise normal appearance. Mild scattered atherosclerotic calcifications aorta without aneurysm. Post appendectomy, hysterectomy, cholecystectomy and question ovarian resections. Bladder and ureters normal appearance. Few scattered diverticula at the sigmoid colon. Stomach and bowel loops otherwise normal appearance. No mass, adenopathy, free fluid, or inflammatory process. Prior fusions of L3-S1.  IMPRESSION: No acute intra-abdominal or intrapelvic abnormalities.  Mild central intrahepatic biliary dilatation, potentially physiologic post cholecystectomy; recommend correlation with LFTs.   Original Report Authenticated By: Ulyses Southward, M.D.      1. Abdominal pain       MDM  The patient's laboratory results and CT scan are normal. The etiology of her abdominal pain is unclear but there does not appear to be any sign of any acute emergency medical condition. At this time I feel that she is safe for outpatient followup. She can continue to take her medications that she takes for her back to help alleviate her abdominal pain complaints the   I personally performed the services described in this documentation, which was scribed in my presence. The recorded information has been reviewed and is accurate.      Tonya Kras, MD 12/24/11 1210

## 2011-12-24 NOTE — Telephone Encounter (Signed)
Patient aware.

## 2011-12-25 NOTE — Assessment & Plan Note (Addendum)
She's been sent to the emergency room for evaluation. As we were unable to get a CT scan until Friday. She had a KUB and CBC C. meds which were unremarkable last week. For evaluation in ED his CT scan was negative for any acute abnormality. I will have her followup with GI Repeat UA negative , repeat labs normal

## 2011-12-27 ENCOUNTER — Ambulatory Visit (INDEPENDENT_AMBULATORY_CARE_PROVIDER_SITE_OTHER): Payer: Medicare Other | Admitting: Family Medicine

## 2011-12-27 ENCOUNTER — Encounter: Payer: Self-pay | Admitting: Family Medicine

## 2011-12-27 ENCOUNTER — Telehealth (HOSPITAL_COMMUNITY): Payer: Self-pay | Admitting: Psychiatry

## 2011-12-27 VITALS — BP 118/78 | HR 86 | Resp 16 | Ht 67.0 in | Wt 198.0 lb

## 2011-12-27 DIAGNOSIS — K120 Recurrent oral aphthae: Secondary | ICD-10-CM

## 2011-12-27 DIAGNOSIS — J040 Acute laryngitis: Secondary | ICD-10-CM | POA: Insufficient documentation

## 2011-12-27 MED ORDER — TRIAMCINOLONE ACETONIDE 0.1 % MT PSTE: PASTE | Freq: Two times a day (BID) | OROMUCOSAL | Status: DC

## 2011-12-27 NOTE — Patient Instructions (Addendum)
Ask at front about the GI referral  Kenalog cream for mouth sores Plenty of fluids Voice may take 1-2 weeks before it comes back  Laryngitis Laryngitis is redness, soreness, and puffiness (inflammation) of the vocal cords. It causes hoarseness, cough, loss of voice, sore throat, and dry throat. It may be caused by:  Infection.   Too much smoking.   Too much talking or yelling.   Breathing in of toxic fumes.   Allergies.   A backup of acid from your stomach.   HOME CARE  Drink enough fluids to keep your pee (urine) clear or pale yellow.   Rest until you no longer have problems or as told by your doctor.   Breathe in moist air.   Take all medicine as told by your doctor.   Do not smoke.   Talk as little as possible (this includes whispering).   Write on paper instead of talking until your voice is back to normal.   Follow up with your doctor if you have not improved after 10 days.  GET HELP IF:    You have trouble breathing.   You cough up blood.   You have a fever that will not go away.   You have increasing pain.   You have trouble swallowing.  MAKE SURE YOU:  Understand these instructions.   Will watch your condition.   Will get help right away if you are not doing well or get worse.  Document Released: 12/07/2010 Document Revised: 03/12/2011 Document Reviewed: 12/07/2010 [facility] Patient Information 2013 [place], [place].

## 2011-12-27 NOTE — Assessment & Plan Note (Signed)
Supportive care, may be viral mediated with apthous ulcer, see instructions

## 2011-12-27 NOTE — Assessment & Plan Note (Signed)
Pt has dentures, orabase sent, she has used topical steroids without any difficulty

## 2011-12-27 NOTE — Telephone Encounter (Signed)
Phone message completed in the phone message section.  

## 2011-12-27 NOTE — Progress Notes (Signed)
  Subjective:    Patient ID: Tonya Sullivan, female    DOB: Oct 08, 1955, 56 y.o.   MRN: [id number]  HPI  Patient presents with sore throat and hoarse voice for the past few days. She denies any fever cough nausea vomiting. She was seen in the ED secondary to need for CT abdomen pelvis which was benign. She states that she is sore spot on her mouth and on the side of her tongue.  Review of Systems  GEN- denies fatigue, fever, weight loss,weakness, recent illness HEENT- denies eye drainage, change in vision,+ nasal discharge,+sore throat CVS- denies chest pain, palpitations RESP- denies SOB, cough, wheeze ABD- denies N/V, change in stools, abd pain Neuro- denies headache, dizziness, syncope, seizure activity      Objective:   Physical Exam GEN- NAD, alert and oriented x3, hoarse voice HEENT- PERRL, EOMI, non injected sclera, pink conjunctiva, MMM, oropharynx non injected, TM clear bilat no effusion, no maxillary sinus tenderness, clear Nasal drainage , right lower gumline small grayish spot TTP, no lesions noted on tongue Neck- Supple, no LAD CVS- RRR, no murmur RESP-CTAB Pulses- Radial 2+          Assessment & Plan:

## 2011-12-28 ENCOUNTER — Emergency Department (HOSPITAL_COMMUNITY)
Admission: EM | Admit: 2011-12-28 | Discharge: 2011-12-28 | Disposition: A | Discharge status: Home / Self Care | Payer: Medicare Other | Attending: Emergency Medicine | Admitting: Emergency Medicine

## 2011-12-28 ENCOUNTER — Encounter (HOSPITAL_COMMUNITY): Payer: Self-pay | Admitting: *Deleted

## 2011-12-28 ENCOUNTER — Ambulatory Visit (HOSPITAL_COMMUNITY): Payer: Medicare Other | Attending: Family Medicine

## 2011-12-28 DIAGNOSIS — F411 Generalized anxiety disorder: Secondary | ICD-10-CM | POA: Insufficient documentation

## 2011-12-28 DIAGNOSIS — Z8679 Personal history of other diseases of the circulatory system: Secondary | ICD-10-CM | POA: Insufficient documentation

## 2011-12-28 DIAGNOSIS — Z90710 Acquired absence of both cervix and uterus: Secondary | ICD-10-CM | POA: Insufficient documentation

## 2011-12-28 DIAGNOSIS — J4489 Other specified chronic obstructive pulmonary disease: Secondary | ICD-10-CM | POA: Insufficient documentation

## 2011-12-28 DIAGNOSIS — F3289 Other specified depressive episodes: Secondary | ICD-10-CM | POA: Insufficient documentation

## 2011-12-28 DIAGNOSIS — F329 Major depressive disorder, single episode, unspecified: Secondary | ICD-10-CM | POA: Insufficient documentation

## 2011-12-28 DIAGNOSIS — Z79899 Other long term (current) drug therapy: Secondary | ICD-10-CM | POA: Insufficient documentation

## 2011-12-28 DIAGNOSIS — F17200 Nicotine dependence, unspecified, uncomplicated: Secondary | ICD-10-CM | POA: Insufficient documentation

## 2011-12-28 DIAGNOSIS — F319 Bipolar disorder, unspecified: Secondary | ICD-10-CM | POA: Insufficient documentation

## 2011-12-28 DIAGNOSIS — E785 Hyperlipidemia, unspecified: Secondary | ICD-10-CM | POA: Insufficient documentation

## 2011-12-28 DIAGNOSIS — J449 Chronic obstructive pulmonary disease, unspecified: Secondary | ICD-10-CM | POA: Insufficient documentation

## 2011-12-28 DIAGNOSIS — Z9889 Other specified postprocedural states: Secondary | ICD-10-CM | POA: Insufficient documentation

## 2011-12-28 DIAGNOSIS — Z7982 Long term (current) use of aspirin: Secondary | ICD-10-CM | POA: Insufficient documentation

## 2011-12-28 DIAGNOSIS — M545 Low back pain, unspecified: Secondary | ICD-10-CM | POA: Insufficient documentation

## 2011-12-28 DIAGNOSIS — G8929 Other chronic pain: Secondary | ICD-10-CM

## 2011-12-28 MED ORDER — HYDROMORPHONE HCL PF 2 MG/ML IJ SOLN
2.0000 mg | Freq: Once | INTRAMUSCULAR | Status: AC
  Administered 2011-12-28: 2 mg via INTRAMUSCULAR
  Filled 2011-12-28: qty 1

## 2011-12-28 MED ORDER — ONDANSETRON 4 MG PO TBDP
4.0000 mg | ORAL_TABLET | Freq: Once | ORAL | Status: AC
  Administered 2011-12-28: 4 mg via ORAL
  Filled 2011-12-28: qty 1

## 2011-12-28 NOTE — ED Provider Notes (Signed)
History     CSN: [id number]  Arrival date & time 12/28/11  1505   First MD Initiated Contact with Patient 12/28/11 1826      Chief Complaint  Patient presents with  . Back Pain    (Consider location/radiation/quality/duration/timing/severity/associated sxs/prior treatment) HPI Comments: Pt has an appt to see dr. Danielle Dess.  She sees dr. Gerilyn Pilgrim for pain management.  Wants shot for back pain.  Patient is a 56 y.o. female presenting with back pain. The history is provided by the patient. No language interpreter was used.  Back Pain  This is a chronic problem. Episode onset: years ago. The problem occurs constantly. The problem has not changed since onset.The pain is associated with no known injury. The pain is present in the lumbar spine. The pain is severe. The symptoms are aggravated by bending and twisting. The pain is the same all the time. Pertinent negatives include no fever, no numbness and no weakness. She has tried heat and analgesics for the symptoms.    Past Medical History  Diagnosis Date  . COPD (chronic obstructive pulmonary disease)   . Depression   . Chronic back pain   . Hyperlipidemia   . Anxiety   . Bipolar disorder, unspecified   . Carotid artery calcification     Past Surgical History  Procedure Date  . Appendectomy   . Tonsillectomy   . Cholecystectomy   . Back surgery     x 5  . Lump left breast     removed- benign  . Abdominal hysterectomy     tubal pregnancy  . Inguinal hernia repair   . Stomach surgery     ?holes in esophgous    Family History  Problem Relation Age of Onset  . Hypertension Mother   . Heart disease Mother   . Cancer Father     lung cancer  . Hypertension Brother   . Alcohol abuse Brother   . Bipolar disorder Brother   . Alcohol abuse Sister   . Bipolar disorder Sister   . Alcohol abuse Brother   . Bipolar disorder Brother   . Alcohol abuse Sister   . Bipolar disorder Sister   . Alcohol abuse Brother   . Bipolar  disorder Brother   . Bipolar disorder Brother   . Bipolar disorder Maternal Aunt   . Bipolar disorder Paternal Aunt   . Bipolar disorder Maternal Uncle   . Bipolar disorder Paternal Uncle   . Bipolar disorder Maternal Grandfather   . Alcohol abuse Maternal Grandfather   . Bipolar disorder Maternal Grandmother   . Alcohol abuse Maternal Grandmother   . Bipolar disorder Paternal Grandfather   . Alcohol abuse Paternal Grandfather   . Bipolar disorder Paternal Grandmother   . Alcohol abuse Paternal Grandmother   . Bipolar disorder Maternal Uncle   . Heart attack Mother   . Heart attack Father     History  Substance Use Topics  . Smoking status: Current Every Day Smoker -- 1.0 packs/day    Types: Cigarettes  . Smokeless tobacco: Never Used  . Alcohol Use: No    OB History    Grav Para Term Preterm Abortions TAB SAB Ect Mult Living                  Review of Systems  Constitutional: Negative for fever and chills.  Musculoskeletal: Positive for back pain.  Neurological: Negative for weakness and numbness.  All other systems reviewed and are negative.    Allergies  Codeine; Cyclobenzaprine; Darvocet; Abilify; Adhesive; Dilaudid; Iodine; Levofloxacin; Prednisone; and Sulfa antibiotics  Home Medications   Current Outpatient Rx  Name  Route  Sig  Dispense  Refill  . ALBUTEROL SULFATE HFA 108 (90 BASE) MCG/ACT IN AERS   Inhalation   Inhale 2 puffs into the lungs every 6 (six) hours as needed. For shortness of breath         . ASPIRIN 81 MG PO TBEC   Oral   Take 1 tablet (81 mg total) by mouth daily. Swallow whole.   30 tablet   12   . CITALOPRAM HYDROBROMIDE 40 MG PO TABS   Oral   Take 1 tablet (40 mg total) by mouth at bedtime.   30 tablet   1   . CYANOCOBALAMIN 500 MCG PO TABS   Oral   Take 500 mcg by mouth daily.         Marland Kitchen DIPHENHYDRAMINE HCL 25 MG PO CAPS   Oral   Take 25 mg by mouth every 6 (six) hours as needed. Allergies         . OMEGA-3 FATTY  ACIDS 1000 MG PO CAPS   Oral   Take 1 g by mouth daily.         Marland Kitchen GABAPENTIN 100 MG PO CAPS      Take by mouth one up to three times a day and up to three caps at bed time.   90 capsule   2   . MELOXICAM 7.5 MG PO TABS   Oral   Take 7.5 mg by mouth 2 (two) times daily.         . ADULT MULTIVITAMIN W/MINERALS CH   Oral   Take 1 tablet by mouth daily.         . OXYCODONE-ACETAMINOPHEN 10-325 MG PO TABS   Oral   Take 1 tablet by mouth every 8 (eight) hours as needed. For pain         . OXYCODONE-ACETAMINOPHEN 5-325 MG PO TABS   Oral   Take 1-2 tablets by mouth every 6 (six) hours as needed for pain.   20 tablet   0   . PRAVASTATIN SODIUM 40 MG PO TABS   Oral   Take 40 mg by mouth at bedtime.         . TRIAMCINOLONE ACETONIDE 0.1 % MT PSTE   dental   Place onto teeth 2 (two) times daily. Or gumline   5 g   3   . ZIPRASIDONE HCL 40 MG PO CAPS   Oral   Take 1 capsule (40 mg total) by mouth 3 (three) times daily with meals.   90 capsule   1     BP 134/88  Pulse 86  Temp 98.5 F (36.9 C) (Oral)  Resp 16  Ht 5\' 7"  (1.702 m)  Wt 198 lb (89.812 kg)  BMI 31.01 kg/m2  SpO2 96%  Physical Exam  Nursing note and vitals reviewed. Constitutional: She is oriented to person, place, and time. She appears well-developed and well-nourished. No distress.  HENT:  Head: Normocephalic and atraumatic.  Eyes: EOM are normal.  Neck: Normal range of motion.  Cardiovascular: Normal rate and regular rhythm.   Pulmonary/Chest: Effort normal.  Abdominal: Soft. She exhibits no distension. There is no tenderness.  Musculoskeletal:       Lumbar back: She exhibits decreased range of motion. She exhibits no tenderness.       Back:       Pain  radiates to R lower leg   Neurological: She is alert and oriented to person, place, and time.  Skin: Skin is warm and dry.  Psychiatric: She has a normal mood and affect. Judgment normal.    ED Course  Procedures (including  critical care time)  Labs Reviewed - No data to display No results found.   1. Acute exacerbation of chronic low back pain       MDM  IM dilaudid 2 mg zofran 4 mg ODT F/u with drs elsner and Dolores Lory, [place] 12/28/11 1844

## 2011-12-28 NOTE — ED Notes (Signed)
Low back pain for several days, Seen here recently for same.

## 2011-12-28 NOTE — ED Notes (Signed)
Lower back pain, radiating to right hip and down right leg. Seen here for same recently. Referred to neurosurgeon and has not followed up.

## 2011-12-29 NOTE — ED Provider Notes (Signed)
Medical screening examination/treatment/procedure(s) were performed by non-physician practitioner and as supervising physician I was immediately available for consultation/collaboration.  Jeweline Reif, MD 12/29/11 0004 

## 2011-12-30 ENCOUNTER — Telehealth: Payer: Self-pay | Admitting: Family Medicine

## 2011-12-30 NOTE — Telephone Encounter (Signed)
Pt called in on 12 /29 with laryngitis symptoms for which she was recently seen, no significant new complaint .

## 2011-12-31 ENCOUNTER — Encounter (HOSPITAL_COMMUNITY): Payer: Self-pay | Admitting: *Deleted

## 2011-12-31 ENCOUNTER — Emergency Department (HOSPITAL_COMMUNITY): Payer: Medicare Other

## 2011-12-31 ENCOUNTER — Emergency Department (HOSPITAL_COMMUNITY)
Admission: EM | Admit: 2011-12-31 | Discharge: 2011-12-31 | Discharge status: Against Medical Advice | Payer: Medicare Other | Attending: Emergency Medicine | Admitting: Emergency Medicine

## 2011-12-31 DIAGNOSIS — F329 Major depressive disorder, single episode, unspecified: Secondary | ICD-10-CM | POA: Insufficient documentation

## 2011-12-31 DIAGNOSIS — G8929 Other chronic pain: Secondary | ICD-10-CM | POA: Insufficient documentation

## 2011-12-31 DIAGNOSIS — R1031 Right lower quadrant pain: Secondary | ICD-10-CM | POA: Insufficient documentation

## 2011-12-31 DIAGNOSIS — Z79899 Other long term (current) drug therapy: Secondary | ICD-10-CM | POA: Insufficient documentation

## 2011-12-31 DIAGNOSIS — M549 Dorsalgia, unspecified: Secondary | ICD-10-CM | POA: Insufficient documentation

## 2011-12-31 DIAGNOSIS — Z7982 Long term (current) use of aspirin: Secondary | ICD-10-CM | POA: Insufficient documentation

## 2011-12-31 DIAGNOSIS — F411 Generalized anxiety disorder: Secondary | ICD-10-CM | POA: Insufficient documentation

## 2011-12-31 DIAGNOSIS — E785 Hyperlipidemia, unspecified: Secondary | ICD-10-CM | POA: Insufficient documentation

## 2011-12-31 DIAGNOSIS — R109 Unspecified abdominal pain: Secondary | ICD-10-CM

## 2011-12-31 DIAGNOSIS — I6529 Occlusion and stenosis of unspecified carotid artery: Secondary | ICD-10-CM | POA: Insufficient documentation

## 2011-12-31 DIAGNOSIS — F3289 Other specified depressive episodes: Secondary | ICD-10-CM | POA: Insufficient documentation

## 2011-12-31 DIAGNOSIS — J4489 Other specified chronic obstructive pulmonary disease: Secondary | ICD-10-CM | POA: Insufficient documentation

## 2011-12-31 DIAGNOSIS — F17200 Nicotine dependence, unspecified, uncomplicated: Secondary | ICD-10-CM | POA: Insufficient documentation

## 2011-12-31 DIAGNOSIS — F319 Bipolar disorder, unspecified: Secondary | ICD-10-CM | POA: Insufficient documentation

## 2011-12-31 DIAGNOSIS — J449 Chronic obstructive pulmonary disease, unspecified: Secondary | ICD-10-CM | POA: Insufficient documentation

## 2011-12-31 MED ORDER — SODIUM CHLORIDE 0.9 % IV BOLUS (SEPSIS)
1000.0000 mL | Freq: Once | INTRAVENOUS | Status: AC
  Administered 2011-12-31: 1000 mL via INTRAVENOUS

## 2011-12-31 MED ORDER — ONDANSETRON HCL 4 MG/2ML IJ SOLN
4.0000 mg | Freq: Once | INTRAMUSCULAR | Status: AC
  Administered 2011-12-31: 4 mg via INTRAVENOUS
  Filled 2011-12-31: qty 2

## 2011-12-31 MED ORDER — SODIUM CHLORIDE 0.9 % IV SOLN
Freq: Once | INTRAVENOUS | Status: AC
  Administered 2011-12-31: 03:00:00 via INTRAVENOUS

## 2011-12-31 MED ORDER — MORPHINE SULFATE 2 MG/ML IJ SOLN
2.0000 mg | Freq: Once | INTRAMUSCULAR | Status: AC
  Administered 2011-12-31: 2 mg via INTRAVENOUS
  Filled 2011-12-31: qty 1

## 2011-12-31 NOTE — ED Notes (Signed)
Pt left dept ama.

## 2011-12-31 NOTE — ED Notes (Signed)
Pt states passed a kidney stone this morning, pt reports lower abdominal pain at this time.

## 2011-12-31 NOTE — ED Notes (Deleted)
Pt been running temp, has a tooth ache & headache also.

## 2011-12-31 NOTE — ED Provider Notes (Signed)
History     CSN: [id number]  Arrival date & time 12/31/11  0041   First MD Initiated Contact with Patient 12/31/11 0140      Chief Complaint  Patient presents with  . Abdominal Pain    (Consider location/radiation/quality/duration/timing/severity/associated sxs/prior treatment) HPI Tonya Sullivan is a 56 y.o. female who presents to the Emergency Department complaining of right lower abdominal pain that recurred tonight. Yesterday morning she was having abdominal pain and passed a kidney stone. Has been pain free all day until tonight. Pain is sharp and stabbing to the right lower abdomen with no radiation. Denies fever, chills, nausea, vomiting. Has taken no medicines. Nothing makes it better or worse.   PCP Dr. Jeanice Lim  Past Medical History  Diagnosis Date  . COPD (chronic obstructive pulmonary disease)   . Depression   . Chronic back pain   . Hyperlipidemia   . Anxiety   . Bipolar disorder, unspecified   . Carotid artery calcification     Past Surgical History  Procedure Date  . Appendectomy   . Tonsillectomy   . Cholecystectomy   . Back surgery     x 5  . Lump left breast     removed- benign  . Abdominal hysterectomy     tubal pregnancy  . Inguinal hernia repair   . Stomach surgery     ?holes in esophgous    Family History  Problem Relation Age of Onset  . Hypertension Mother   . Heart disease Mother   . Cancer Father     lung cancer  . Hypertension Brother   . Alcohol abuse Brother   . Bipolar disorder Brother   . Alcohol abuse Sister   . Bipolar disorder Sister   . Alcohol abuse Brother   . Bipolar disorder Brother   . Alcohol abuse Sister   . Bipolar disorder Sister   . Alcohol abuse Brother   . Bipolar disorder Brother   . Bipolar disorder Brother   . Bipolar disorder Maternal Aunt   . Bipolar disorder Paternal Aunt   . Bipolar disorder Maternal Uncle   . Bipolar disorder Paternal Uncle   . Bipolar disorder Maternal Grandfather   . Alcohol  abuse Maternal Grandfather   . Bipolar disorder Maternal Grandmother   . Alcohol abuse Maternal Grandmother   . Bipolar disorder Paternal Grandfather   . Alcohol abuse Paternal Grandfather   . Bipolar disorder Paternal Grandmother   . Alcohol abuse Paternal Grandmother   . Bipolar disorder Maternal Uncle   . Heart attack Mother   . Heart attack Father     History  Substance Use Topics  . Smoking status: Current Every Day Smoker -- 1.0 packs/day    Types: Cigarettes  . Smokeless tobacco: Never Used  . Alcohol Use: No    OB History    Grav Para Term Preterm Abortions TAB SAB Ect Mult Living                  Review of Systems  Constitutional: Negative for fever.       10 Systems reviewed and are negative for acute change except as noted in the HPI.  HENT: Negative for congestion.   Eyes: Negative for discharge and redness.  Respiratory: Negative for cough and shortness of breath.   Cardiovascular: Negative for chest pain.  Gastrointestinal: Positive for abdominal pain. Negative for vomiting.  Musculoskeletal: Negative for back pain.  Skin: Negative for rash.  Neurological: Negative for syncope, numbness and  headaches.  Psychiatric/Behavioral:       No behavior change.    Allergies  Codeine; Cyclobenzaprine; Darvocet; Abilify; Adhesive; Dilaudid; Iodine; Levofloxacin; Prednisone; and Sulfa antibiotics  Home Medications   Current Outpatient Rx  Name  Route  Sig  Dispense  Refill  . ALBUTEROL SULFATE HFA 108 (90 BASE) MCG/ACT IN AERS   Inhalation   Inhale 2 puffs into the lungs every 6 (six) hours as needed. For shortness of breath         . ASPIRIN 81 MG PO TBEC   Oral   Take 1 tablet (81 mg total) by mouth daily. Swallow whole.   30 tablet   12   . CITALOPRAM HYDROBROMIDE 40 MG PO TABS   Oral   Take 1 tablet (40 mg total) by mouth at bedtime.   30 tablet   1   . CYANOCOBALAMIN 500 MCG PO TABS   Oral   Take 500 mcg by mouth daily.         Marland Kitchen  DIPHENHYDRAMINE HCL 25 MG PO CAPS   Oral   Take 25 mg by mouth every 6 (six) hours as needed. Allergies         . OMEGA-3 FATTY ACIDS 1000 MG PO CAPS   Oral   Take 1 g by mouth daily.         Marland Kitchen GABAPENTIN 100 MG PO CAPS      Take by mouth one up to three times a day and up to three caps at bed time.   90 capsule   2   . MELOXICAM 7.5 MG PO TABS   Oral   Take 7.5 mg by mouth 2 (two) times daily.         . ADULT MULTIVITAMIN W/MINERALS CH   Oral   Take 1 tablet by mouth daily.         . OXYCODONE-ACETAMINOPHEN 10-325 MG PO TABS   Oral   Take 1 tablet by mouth every 8 (eight) hours as needed. For pain         . PRAVASTATIN SODIUM 40 MG PO TABS   Oral   Take 40 mg by mouth at bedtime.         . TRIAMCINOLONE ACETONIDE 0.1 % MT PSTE   dental   Place onto teeth 2 (two) times daily. Or gumline   5 g   3   . ZIPRASIDONE HCL 40 MG PO CAPS   Oral   Take 1 capsule (40 mg total) by mouth 3 (three) times daily with meals.   90 capsule   1     BP 130/73  Pulse 78  Temp 97.9 F (36.6 C) (Oral)  Resp 20  Ht 5\' 7"  (1.702 m)  Wt 198 lb (89.812 kg)  BMI 31.01 kg/m2  SpO2 98%  Physical Exam  Nursing note and vitals reviewed. Constitutional: She appears well-developed and well-nourished.       Awake, alert, nontoxic appearance.  HENT:  Head: Normocephalic and atraumatic.  Eyes: Right eye exhibits no discharge. Left eye exhibits no discharge.  Neck: Neck supple.  Pulmonary/Chest: Effort normal and breath sounds normal. She exhibits no tenderness.  Abdominal: Soft. There is tenderness. There is no rebound and no guarding.       Mild right lower abdominal pain to palpation.  Genitourinary:       No cva tenderness to percussion  Musculoskeletal: She exhibits no tenderness.       Baseline ROM, no obvious new  focal weakness.  Neurological:       Mental status and motor strength appears baseline for patient and situation.  Skin: No rash noted.  Psychiatric: She  has a normal mood and affect.    ED Course  Procedures (including critical care time)    [phone] Patient leaving the department without receiving CT results. She feels better and does not want to wait.  MDM  Patient presents with RLQ abdominal pain having passed a kidney stone yesterday morning. She was given IVF, analgesic, antiemetic and antiinflammatory with relief of pain. Awaiting CT results when she opted to leave the department.  discussed risk of death/disability of leaving against medical advice and the patient accepts these risks.  The patient is awake/alet able to make decisions, and not intoxicated Patient discharged against medical advice.  MDM Reviewed: nursing note and vitals Interpretation: labs           Nicoletta Dress. Colon Branch, MD 12/31/11 [phone]

## 2012-01-01 ENCOUNTER — Telehealth: Payer: Self-pay | Admitting: Family Medicine

## 2012-01-01 ENCOUNTER — Ambulatory Visit (INDEPENDENT_AMBULATORY_CARE_PROVIDER_SITE_OTHER): Payer: Medicare Other | Admitting: Family Medicine

## 2012-01-01 ENCOUNTER — Encounter: Payer: Self-pay | Admitting: Family Medicine

## 2012-01-01 VITALS — BP 136/78 | HR 86 | Resp 18 | Ht 67.0 in | Wt 197.0 lb

## 2012-01-01 DIAGNOSIS — F319 Bipolar disorder, unspecified: Secondary | ICD-10-CM

## 2012-01-01 DIAGNOSIS — Z720 Tobacco use: Secondary | ICD-10-CM

## 2012-01-01 DIAGNOSIS — K120 Recurrent oral aphthae: Secondary | ICD-10-CM

## 2012-01-01 DIAGNOSIS — R109 Unspecified abdominal pain: Secondary | ICD-10-CM

## 2012-01-01 DIAGNOSIS — E785 Hyperlipidemia, unspecified: Secondary | ICD-10-CM

## 2012-01-01 DIAGNOSIS — I6529 Occlusion and stenosis of unspecified carotid artery: Secondary | ICD-10-CM

## 2012-01-01 DIAGNOSIS — G8929 Other chronic pain: Secondary | ICD-10-CM

## 2012-01-01 DIAGNOSIS — J449 Chronic obstructive pulmonary disease, unspecified: Secondary | ICD-10-CM

## 2012-01-01 DIAGNOSIS — M549 Dorsalgia, unspecified: Secondary | ICD-10-CM

## 2012-01-01 DIAGNOSIS — F17200 Nicotine dependence, unspecified, uncomplicated: Secondary | ICD-10-CM

## 2012-01-01 DIAGNOSIS — J040 Acute laryngitis: Secondary | ICD-10-CM

## 2012-01-01 MED ORDER — AZITHROMYCIN 250 MG PO TABS: ORAL_TABLET | ORAL | Status: AC

## 2012-01-01 MED ORDER — HYDROXYZINE PAMOATE 25 MG PO CAPS: ORAL_CAPSULE | ORAL | Status: DC

## 2012-01-01 MED ORDER — FIRST-DUKES MOUTHWASH MT SUSP: OROMUCOSAL | Status: DC

## 2012-01-01 MED ORDER — TIOTROPIUM BROMIDE MONOHYDRATE 18 MCG IN CAPS: 18.0000 ug | ORAL_CAPSULE | Freq: Every day | RESPIRATORY_TRACT | Status: DC

## 2012-01-01 NOTE — Assessment & Plan Note (Signed)
F/u with neurosurgery Jan 20th, chronic pain control, no red flags, no new changes on MRI

## 2012-01-01 NOTE — Patient Instructions (Signed)
Start antibiotics Start the spriva for your breathing Discuss your pain medications with Dr. Gerilyn Pilgrim Magic mouthwash for the ulcer in your mouth  Carotid doppler to be set up Appt with Dr. Karilyn Cota for abdominal pain Vistaril at bedtime for sleep F/U 3 months, labs to be done 1 week before

## 2012-01-01 NOTE — Assessment & Plan Note (Signed)
Change to majic mouthwash

## 2012-01-01 NOTE — Assessment & Plan Note (Signed)
Chronic pain in back , needs to f/u with Dr. Gerilyn Pilgrim to discuss her meds,

## 2012-01-01 NOTE — Assessment & Plan Note (Signed)
Unfortunately I have no record of the original dopplers done, will repeat, advised her acute abdominal pain/back pain is taking precedence, as she was upset she had not had this done already after mentioned at last visit

## 2012-01-01 NOTE — Assessment & Plan Note (Signed)
Her mental illness is at the center all of her frustrations and concerns, she does not understand why she was taken off her benzo, she is stressed all the time, feels depressed, cant sleep, nothing is going right, states she is not frustrated at any one person but everything  Add vistaril until seen by psychiatry, I do not think benzo would be good for her current state, defer to psychiatry

## 2012-01-01 NOTE — Assessment & Plan Note (Signed)
Deteriorated, start antibiotics, restart spiriva, allergy to prednisone She continues to smoke, is not ready to quit, was on inhalers in the past stopped them herself, states she knows her body and her oxygen levels were good Discussed importance of following up and taking meds

## 2012-01-01 NOTE — Assessment & Plan Note (Signed)
Counseled on cessation, not ready to quit.  

## 2012-01-01 NOTE — Assessment & Plan Note (Signed)
unchanged

## 2012-01-01 NOTE — Assessment & Plan Note (Signed)
Awaiting appt with GI, ? Pt passed a grey like stone, no signs of stone, no hematuria, left AMA from ER last visit

## 2012-01-01 NOTE — Telephone Encounter (Signed)
Pt called in on 12/29/201 reporting passing a grey stone in her urine. No previous h/o kidney stones. States she is currently being evaluated by her PCP for abdominal pain. No change in the pain, she just wanted to inform of the stone

## 2012-01-01 NOTE — Progress Notes (Signed)
  Subjective:    Patient ID: Tonya Sullivan, female    DOB: 1955/11/16, 56 y.o.   MRN: [id number]  HPI Pt here to f/u chronic medical problems this is 4th visit in past 2 weeks, she was last seen in July and missed her f/u appt, she has a Chief Operating Officer of concerns, and feels she is not getting help and is frustrated, she does not want to move in with son and is being pressured, she needs help at home and has been denied by [facility] services twice through my office and this frustrates her, pain is not controlled in back or abdomen, has been in ED 3 times now, left AMA. Still has ulcer in mouth and orabase is not helping, cough has worsened and she has some wheezing now, smoking 1ppd because of her nerves, has not been sleeping well and wants sleeping medication.   Review of Systems    GEN- denies fatigue, fever, weight loss,weakness, recent illness HEENT- denies eye drainage, change in vision, nasal discharge, CVS- denies chest pain, palpitations RESP- denies SOB, +cough, +wheeze ABD- denies N/V, change in stools, abd pain GU- denies dysuria, hematuria, dribbling, incontinence MSK- + joint pain, muscle aches, injury Neuro- denies headache, dizziness, syncope, seizure activity       Objective:   Physical Exam GEN- NAD, alert and oriented x3, hoarse voice HEENT- PERRL, EOMI, non injected sclera, pink conjunctiva, MMM, oropharynx non injected, TM clear bilat no effusion, no maxillary sinus tenderness, clear Nasal drainage , right lower gumline ulcer noted where denture meets gumline Neck- Supple, no bruit heard, LAD CVS- RRR, no murmur RESP-rhonchi heard bilat some clears with cough, normal WOB, few wheeze Ext- no edema Psych- depressed flat affect, not  anxious appearing,  Pulses- Radial 2+       Assessment & Plan:

## 2012-01-03 ENCOUNTER — Ambulatory Visit (INDEPENDENT_AMBULATORY_CARE_PROVIDER_SITE_OTHER): Payer: Medicare Other | Admitting: Psychiatry

## 2012-01-03 ENCOUNTER — Encounter (HOSPITAL_COMMUNITY): Payer: Self-pay | Admitting: Psychiatry

## 2012-01-03 VITALS — Wt 198.8 lb

## 2012-01-03 DIAGNOSIS — F319 Bipolar disorder, unspecified: Secondary | ICD-10-CM

## 2012-01-03 DIAGNOSIS — M549 Dorsalgia, unspecified: Secondary | ICD-10-CM

## 2012-01-03 DIAGNOSIS — Z720 Tobacco use: Secondary | ICD-10-CM

## 2012-01-03 DIAGNOSIS — R52 Pain, unspecified: Secondary | ICD-10-CM

## 2012-01-03 DIAGNOSIS — F329 Major depressive disorder, single episode, unspecified: Secondary | ICD-10-CM

## 2012-01-03 DIAGNOSIS — G8929 Other chronic pain: Secondary | ICD-10-CM

## 2012-01-03 DIAGNOSIS — F419 Anxiety disorder, unspecified: Secondary | ICD-10-CM

## 2012-01-03 DIAGNOSIS — G4700 Insomnia, unspecified: Secondary | ICD-10-CM

## 2012-01-03 MED ORDER — DULOXETINE HCL 30 MG PO CPEP: 30.0000 mg | ORAL_CAPSULE | Freq: Two times a day (BID) | ORAL | Status: DC

## 2012-01-03 MED ORDER — LIDOCAINE 5 % EX PTCH: 1.0000 | MEDICATED_PATCH | Freq: Two times a day (BID) | CUTANEOUS | Status: DC

## 2012-01-03 MED ORDER — GABAPENTIN 300 MG PO CAPS: ORAL_CAPSULE | ORAL | Status: DC

## 2012-01-03 NOTE — Progress Notes (Signed)
Tonya Sullivan [id number]  Date: 01/03/2012 9:53 AM  Chief complaint Chief Complaint  Patient presents with  . Depression  . Follow-up  . Medication Refill  . Anxiety   Subjective: "I not doing so well.  I've been in counseling since age 57."  Objective: Family history revised some. Medications reviewed.  She has not increased the Geodon yet.  She is not on Cymbalta or Lidoderm.  Will switch from Celexa to Cymbalta and add Lidoderm for her back pain management.   Current psychiatric medication Klonopin 1 mg at bedtime and Vistaril at bedtime Celexa 40 mg daily Geodon 40 mg twice a Neurontin 100 three times a day and two at bedtime.  Past psychiatric history Patient endorse history of depression since age 23.  She has been at least 2 psychiatric admission due to suicidal thoughts.  She denies any history of suicidal attempt however endorse endorse history of paranoia and severe depression.  Her last psychiatric admission was January 2012 due to significant depression.  In the past she had tried gabapentin, Risperdal, trazodone, Abilify , Effexor, Valium, Lamictal, Klonopin and recently Wellbutrin.    Medical history She has history of history of chronic back pain, hyperlipidemia, coronary artery calcification and chest pain.  Family History: family history includes ADD / ADHD in her brothers and sisters; Alcohol abuse in her brothers, father, maternal grandfather, maternal grandmother, paternal grandfather, paternal grandmother, and sisters; Anxiety disorder in her maternal grandmother, mother, and sisters; Bipolar disorder in her brothers, maternal aunt, maternal grandfather, maternal grandmother, maternal uncles, paternal aunt, paternal grandfather, paternal grandmother, paternal uncle, and sisters; Cancer in her father; Dementia in her maternal grandmother; Drug abuse in her brothers, paternal grandmother, and sister; Heart attack in her father and mother; Heart disease in her mother;  and Hypertension in her brother and mother.  Mental status examination Patient is casually dressed and fairly groomed. She appears calm cooperative and relevant in conversation.  She maintained good eye contact.  Her speech is soft clear and coherent.  Her thought processes logical goal-directed.  She described her mood is good and her affect is improved from the past.  She denies any auditory or visual hallucination.  She denies any active or passive suicidal thoughts or homicidal thoughts.  There is some paranoia but there were no delusion present.  Her attention and concentration is better.  She's alert and oriented x3.  Her insight judgment and impulse control is okay.  Diagnoses Axis I  Major depressive disorder, rule out bipolar disorder  Axis II deferred Axis III see medical history Axis IV moderate Axis V 55-60  Plan I took her vitals.  I reviewed CC, tobacco/med/surg Hx, meds effects/ side effects, problem list, therapies and responses as well as current situation/symptoms discussed options. See orders and pt instructions for more details.

## 2012-01-03 NOTE — Patient Instructions (Signed)
Take the patch off after 12 hours.  Pick a good time like 8 o'clock. And look dow if one is on there take it off if none is on then put one on.   Put adhesive tape on the patches.  They don't stick very well.  Keep the 12 Step programs up.  They  work if you work them.

## 2012-01-04 ENCOUNTER — Telehealth (HOSPITAL_COMMUNITY): Payer: Self-pay | Admitting: Psychiatry

## 2012-01-07 NOTE — Telephone Encounter (Signed)
Got Lidoderm preauthed!

## 2012-01-08 ENCOUNTER — Ambulatory Visit (HOSPITAL_COMMUNITY)
Admission: RE | Admit: 2012-01-08 | Discharge: 2012-01-08 | Disposition: A | Discharge status: Home / Self Care | Payer: Medicare Other | Source: Ambulatory Visit | Attending: Family Medicine | Admitting: Family Medicine

## 2012-01-08 DIAGNOSIS — I6529 Occlusion and stenosis of unspecified carotid artery: Secondary | ICD-10-CM | POA: Insufficient documentation

## 2012-01-09 ENCOUNTER — Telehealth: Payer: Self-pay | Admitting: Family Medicine

## 2012-01-10 NOTE — Telephone Encounter (Signed)
Called patient and left message for them to return call at the office   

## 2012-01-10 NOTE — Telephone Encounter (Signed)
Patient aware that Dr on will review upon return on Monday

## 2012-01-11 ENCOUNTER — Ambulatory Visit (HOSPITAL_COMMUNITY): Payer: Self-pay | Admitting: Psychiatry

## 2012-01-14 ENCOUNTER — Ambulatory Visit (INDEPENDENT_AMBULATORY_CARE_PROVIDER_SITE_OTHER): Payer: Medicare Other | Admitting: Psychiatry

## 2012-01-14 ENCOUNTER — Telehealth (HOSPITAL_COMMUNITY): Payer: Self-pay | Admitting: Psychiatry

## 2012-01-14 DIAGNOSIS — F339 Major depressive disorder, recurrent, unspecified: Secondary | ICD-10-CM

## 2012-01-14 DIAGNOSIS — F329 Major depressive disorder, single episode, unspecified: Secondary | ICD-10-CM

## 2012-01-14 MED ORDER — ZIPRASIDONE HCL 60 MG PO CAPS: 60.0000 mg | ORAL_CAPSULE | Freq: Two times a day (BID) | ORAL | Status: DC

## 2012-01-14 NOTE — Telephone Encounter (Signed)
Phone message completed in the phone message section.  

## 2012-01-14 NOTE — Progress Notes (Signed)
Patient:  Tonya Sullivan   DOB: Nov 27, 1955  MR Number: [id number]  Location: [facility]:  [address] [place] [place],  [place], [address]  Start: Monday 01/14/2012 1:05 PM End: Monday 1/13.2014 1:35 PM  Provider/Observer:     Florencia Reasons, MSW, LCSW   Chief Complaint:      Chief Complaint  Patient presents with  . Anxiety  . Depression    Reason For Service:     The patient was referred for services by psychiatrist Dr. Lolly Mustache due to patient experiencing anxiety, stress, depression, and mood swings. She has a long-standing history of depression beginning in adolescence. Patient also has significant trauma history. Patient also is experiencing grief and loss issues due to to the death of her husband in 2010/09/20. Patient is seen today for follow up appointment.   Interventions Strategy:  Supportive therapy,  cognitive behavioral therapy  Participation Level:   Active  Participation Quality:  Appropriate      Behavioral Observation:  Well goomed, Alert, pleasant  Current Psychosocial Factors: Patient  Content of Session:   Reviewing symptoms, processing feelings, reinforcing patient's efforts to expand support system, reframing negative thoughts, identifying coping techniques  Current Status:   Patient reports increased anxiety, panic attacks, and poor eating patterns along with increased sadness. She denies suicidal and homicidal ideations.  Patient Progress:    Fair. The patient reports increased stress and anxiety as a growth on her finger was diagnosed as cancer by patient's dermatologist last Thursday. The growth was removed and results of the pathology report should be available by this Thursday. This has triggered memories of patient's husband being diagnosed with cancer. Patient expresses fear and worry. Therapist works with patient to limit the amount of time patient thinks about this daily and to identify distracting activities. Patient reports increased  loneliness as she states her husband is not here to go through this with her and she has to go through this alone.  She reports her son has stated that he will go with her to the followup doctor's visit. However, patient is skeptical as son and his family have had inconsistent contact with patient recently. As a result, patient reports being anxious about the relationship and trying to figure out if she has done something wrong. She reports confronting them but they have repeatedly told her that everything is okay. She also expresses frustration as her son owes her money but has not yet paid patient.  Patient also reports increased anxiety and frustration as she was without Geodon for 5 days due to to waiting for authorization from her insurance company. Patient reports additional stress related to her mother whose health continues to decline. Patient has started attending AA regularly and  has begun reaching out to one of her neighbors.  Target Goals:   1. Improving mood as evidenced by resuming normal interest in activities and increased social involvement. 2. Increase self acceptance and improve self-esteem as evidenced by patient making positive statements about self. 3.  Improve coping and relaxation techniques. 4. Process and resolve grief and loss issues.   Last Reviewed:   04/06/2011  Goals Addressed Today:    Goals 1 and 3  Impression/Diagnosis:   The patient presents with a long-standing history of recurrent depression, mood swings, and a significant trauma history.  Diagnosis: Major Depressive Disorder  Diagnosis:  Axis I:  Major Depressive Disorder           Axis II: Deferred

## 2012-01-14 NOTE — Patient Instructions (Signed)
Discussed orally 

## 2012-01-15 ENCOUNTER — Telehealth: Payer: Self-pay | Admitting: Family Medicine

## 2012-01-15 NOTE — Telephone Encounter (Signed)
Patient aware of results.

## 2012-01-17 ENCOUNTER — Encounter (INDEPENDENT_AMBULATORY_CARE_PROVIDER_SITE_OTHER): Payer: Self-pay | Admitting: Internal Medicine

## 2012-01-17 ENCOUNTER — Other Ambulatory Visit (INDEPENDENT_AMBULATORY_CARE_PROVIDER_SITE_OTHER): Payer: Self-pay | Admitting: *Deleted

## 2012-01-17 ENCOUNTER — Ambulatory Visit (INDEPENDENT_AMBULATORY_CARE_PROVIDER_SITE_OTHER): Payer: Medicare Other | Admitting: Internal Medicine

## 2012-01-17 ENCOUNTER — Telehealth (INDEPENDENT_AMBULATORY_CARE_PROVIDER_SITE_OTHER): Payer: Self-pay | Admitting: *Deleted

## 2012-01-17 ENCOUNTER — Encounter (INDEPENDENT_AMBULATORY_CARE_PROVIDER_SITE_OTHER): Payer: Self-pay | Admitting: *Deleted

## 2012-01-17 VITALS — BP 94/72 | HR 72 | Temp 97.7°F | Ht 67.0 in | Wt 197.4 lb

## 2012-01-17 DIAGNOSIS — R109 Unspecified abdominal pain: Secondary | ICD-10-CM

## 2012-01-17 DIAGNOSIS — Z1211 Encounter for screening for malignant neoplasm of colon: Secondary | ICD-10-CM

## 2012-01-17 DIAGNOSIS — R102 Pelvic and perineal pain: Secondary | ICD-10-CM | POA: Insufficient documentation

## 2012-01-17 DIAGNOSIS — R1024 Suprapubic pain: Secondary | ICD-10-CM | POA: Insufficient documentation

## 2012-01-17 MED ORDER — PEG 3350-KCL-NA BICARB-NACL 420 G PO SOLR: 4000.0000 mL | Freq: Once | ORAL | Status: DC

## 2012-01-17 NOTE — Progress Notes (Signed)
Subjective:     Patient ID: Tonya Sullivan, female   DOB: October 18, 1955, 57 y.o.   MRN: [id number]  HPIReferred to our office for abdominal pain. Seen in the ED 12/31/2011 for rt and left lower abdominal pain. (She actually left AMA)  She tells me she has had this pain for years.She may have this pain 2-3 times a month. She thinks the pain is related to adhesions.  The past comes and goes. It feels like her ovaries. She however has had a complete hysterectomy.  She tells me the pain is not related to having a BM.   She tells me she passed a kidney stone on 12/27/2011.  No nausea or vomiting. No fever.  There has been no abdominal pain x 2 weeks.  Appetite is good. Weight loss of 10 pounds intentional. No acid reflux. No epigastric pain. No lower abdominal pain x 2 weeks. No melena or bright red rectal bleeding.  Her last colonoscopy 2000 by Dr. Linna Darner  12/31/2011 CT abdomen/pelvis without  CM: IMPRESSION:  1. No acute abnormality seen within the abdomen or pelvis.  2. Scattered diverticulosis noted along the sigmoid colon, without  evidence of diverticulitis.  3. Mild bilateral perinephric stranding noted; no evidence of  hydronephrosis at this time. No renal or ureteral stones seen.  4. Mild bibasilar atelectasis noted.  5. Mild scattered calcification along the distal abdominal aorta  and its branches. CMP     Component Value Date/Time   NA 138 12/24/2011 1023   K 4.6 12/24/2011 1023   CL 102 12/24/2011 1023   CO2 28 12/24/2011 1023   GLUCOSE 95 12/24/2011 1023   BUN 7 12/24/2011 1023   CREATININE 1.08 12/24/2011 1023   CREATININE 1.01 12/14/2011 1514   CALCIUM 9.3 12/24/2011 1023   PROT 7.4 12/24/2011 1023   ALBUMIN 3.8 12/24/2011 1023   AST 18 12/24/2011 1023   ALT 10 12/24/2011 1023   ALKPHOS 66 12/24/2011 1023   BILITOT 0.2* 12/24/2011 1023   GFRNONAA 56* 12/24/2011 1023   GFRAA 65* 12/24/2011 1023    CBC    Component Value Date/Time   WBC 6.0 12/24/2011 1023   RBC 4.82  12/24/2011 1023   HGB 15.0 12/24/2011 1023   HCT 44.5 12/24/2011 1023   PLT 262 12/24/2011 1023   MCV 92.3 12/24/2011 1023   MCH 31.1 12/24/2011 1023   MCHC 33.7 12/24/2011 1023   RDW 13.4 12/24/2011 1023   LYMPHSABS 2.2 12/24/2011 1023   MONOABS 0.3 12/24/2011 1023   EOSABS 0.1 12/24/2011 1023   BASOSABS 0.0 12/24/2011 1023    Drugs of Abuse     Component Value Date/Time   LABOPIA POSITIVE* 03/05/2009 1755   COCAINSCRNUR NONE DETECTED 03/05/2009 1755   LABBENZ POSITIVE* 03/05/2009 1755   AMPHETMU NONE DETECTED 03/05/2009 1755   THCU NONE DETECTED 03/05/2009 1755   LABBARB  Value: NONE DETECTED        DRUG SCREEN FOR MEDICAL PURPOSES ONLY.  IF CONFIRMATION IS NEEDED FOR ANY PURPOSE, NOTIFY LAB WITHIN 5 DAYS.        LOWEST DETECTABLE LIMITS FOR URINE DRUG SCREEN Drug Class       Cutoff (ng/mL) Amphetamine      1000 Barbiturate      200 Benzodiazepine   200 Tricyclics       300 Opiates          300 Cocaine          300 THC  50 03/05/2009 1755    Urinalysis    Component Value Date/Time   COLORURINE YELLOW 12/24/2011 1042   APPEARANCEUR CLEAR 12/24/2011 1042   LABSPEC <1.005* 12/24/2011 1042   PHURINE 6.0 12/24/2011 1042   GLUCOSEU NEGATIVE 12/24/2011 1042   HGBUR NEGATIVE 12/24/2011 1042   BILIRUBINUR neg 12/24/2011 1523   BILIRUBINUR NEGATIVE 12/24/2011 1042   KETONESUR NEGATIVE 12/24/2011 1042   PROTEINUR NEGATIVE 12/24/2011 1042   UROBILINOGEN 0.2 12/24/2011 1523   UROBILINOGEN 0.2 12/24/2011 1042   NITRITE neg 12/24/2011 1523   NITRITE NEGATIVE 12/24/2011 1042   LEUKOCYTESUR Negative 12/24/2011 1523       Review of Systems see hpi  Current Outpatient Prescriptions  Medication Sig Dispense Refill  . albuterol (PROVENTIL HFA) 108 (90 BASE) MCG/ACT inhaler Inhale 2 puffs into the lungs every 6 (six) hours as needed. For shortness of breath      . aspirin (ASPIRIN EC) 81 MG EC tablet Take 1 tablet (81 mg total) by mouth daily. Swallow whole.  30 tablet  12  .  cyanocobalamin 500 MCG tablet Take 500 mcg by mouth daily.      . Diphenhyd-Hydrocort-Nystatin (FIRST-DUKES MOUTHWASH) SUSP Swish every 4 hours as needed  1 Bottle  1  . DULoxetine (CYMBALTA) 30 MG capsule Take 1 capsule (30 mg total) by mouth 2 (two) times daily.  60 capsule  2  . fish oil-omega-3 fatty acids 1000 MG capsule Take 1 g by mouth daily.      Marland Kitchen gabapentin (NEURONTIN) 300 MG capsule Take by mouth one up to three times a day and up to three caps at bed time.  120 capsule  2  . hydrOXYzine (VISTARIL) 25 MG capsule 1-2 tablet at bedtime as needed for sleep  45 capsule  1  . lidocaine (LIDODERM) 5 % Place 1 patch onto the skin every 12 (twelve) hours. Remove & Discard patch within 12 hours or as directed by MD  10 patch  0  . meloxicam (MOBIC) 7.5 MG tablet Take 7.5 mg by mouth 2 (two) times daily.      . Multiple Vitamin (MULTIVITAMIN WITH MINERALS) TABS Take 1 tablet by mouth daily.      Marland Kitchen oxyCODONE-acetaminophen (PERCOCET) 10-325 MG per tablet Take 1 tablet by mouth every 6 (six) hours as needed. For pain      . pravastatin (PRAVACHOL) 40 MG tablet Take 40 mg by mouth at bedtime.      Marland Kitchen tiotropium (SPIRIVA HANDIHALER) 18 MCG inhalation capsule Place 1 capsule (18 mcg total) into inhaler and inhale daily.  30 capsule  6  . ziprasidone (GEODON) 60 MG capsule Take 1 capsule (60 mg total) by mouth 2 (two) times daily with a meal.  60 capsule  1  . [DISCONTINUED] Gabapentin, PHN, (GRALISE STARTER) 300 & 600 MG MISC Take 300-1,800 mg by mouth as directed. 15 DAY Starter pack doses     DAY 1- 300 mg     DAY 2- 600 mg     DAY 3 to 6 - 900 mg    DAY 7 to 10 - 1200 mg     DAY 11 to 14 - 1500 mg      DAY 15 - 1800 mg        Current Outpatient Prescriptions on File Prior to Visit  Medication Sig Dispense Refill  . albuterol (PROVENTIL HFA) 108 (90 BASE) MCG/ACT inhaler Inhale 2 puffs into the lungs every 6 (six) hours as needed. For shortness of breath      .  aspirin (ASPIRIN EC) 81 MG EC tablet  Take 1 tablet (81 mg total) by mouth daily. Swallow whole.  30 tablet  12  . cyanocobalamin 500 MCG tablet Take 500 mcg by mouth daily.      . Diphenhyd-Hydrocort-Nystatin (FIRST-DUKES MOUTHWASH) SUSP Swish every 4 hours as needed  1 Bottle  1  . DULoxetine (CYMBALTA) 30 MG capsule Take 1 capsule (30 mg total) by mouth 2 (two) times daily.  60 capsule  2  . fish oil-omega-3 fatty acids 1000 MG capsule Take 1 g by mouth daily.      Marland Kitchen gabapentin (NEURONTIN) 300 MG capsule Take by mouth one up to three times a day and up to three caps at bed time.  120 capsule  2  . hydrOXYzine (VISTARIL) 25 MG capsule 1-2 tablet at bedtime as needed for sleep  45 capsule  1  . lidocaine (LIDODERM) 5 % Place 1 patch onto the skin every 12 (twelve) hours. Remove & Discard patch within 12 hours or as directed by MD  10 patch  0  . meloxicam (MOBIC) 7.5 MG tablet Take 7.5 mg by mouth 2 (two) times daily.      . Multiple Vitamin (MULTIVITAMIN WITH MINERALS) TABS Take 1 tablet by mouth daily.      Marland Kitchen oxyCODONE-acetaminophen (PERCOCET) 10-325 MG per tablet Take 1 tablet by mouth every 6 (six) hours as needed. For pain      . pravastatin (PRAVACHOL) 40 MG tablet Take 40 mg by mouth at bedtime.      Marland Kitchen tiotropium (SPIRIVA HANDIHALER) 18 MCG inhalation capsule Place 1 capsule (18 mcg total) into inhaler and inhale daily.  30 capsule  6  . ziprasidone (GEODON) 60 MG capsule Take 1 capsule (60 mg total) by mouth 2 (two) times daily with a meal.  60 capsule  1  . [DISCONTINUED] Gabapentin, PHN, (GRALISE STARTER) 300 & 600 MG MISC Take 300-1,800 mg by mouth as directed. 15 DAY Starter pack doses     DAY 1- 300 mg     DAY 2- 600 mg     DAY 3 to 6 - 900 mg    DAY 7 to 10 - 1200 mg     DAY 11 to 14 - 1500 mg      DAY 15 - 1800 mg        . Past Medical History  Diagnosis Date  . COPD (chronic obstructive pulmonary disease)   . Depression   . Chronic back pain     DDD, disc bulge, radiculopathy, spinal stenosis  . Hyperlipidemia     . Anxiety   . Bipolar disorder, unspecified   . Carotid artery calcification   . Melanoma     removed 1 week ago at Dr Laurena Slimmer office   Past Surgical History  Procedure Date  . Appendectomy   . Tonsillectomy   . Cholecystectomy   . Back surgery     x 5  . Lump left breast     removed- benign  . Abdominal hysterectomy     tubal pregnancy  . Inguinal hernia repair   . Stomach surgery     ?holes in esophgous  . Ectopic pregnancy surgery    Allergies  Allergen Reactions  . Codeine Anaphylaxis  . Cyclobenzaprine Anaphylaxis  . Darvocet (Propoxyphene-Acetaminophen) Anaphylaxis  . Abilify (Aripiprazole) Other (See Comments)    tremors  . Adhesive (Tape) Hives  . Dilaudid (Hydromorphone Hcl) Nausea And Vomiting    Only the pills.   Marland Kitchen  Iodine Hives  . Levofloxacin Hives  . Prednisone Hives  . Sulfa Antibiotics Itching       Objective:   Physical Exam Filed Vitals:   01/17/12 1109  BP: 94/72  Pulse: 72  Temp: 97.7 F (36.5 C)  Height: 5\' 7"  (1.702 m)  Weight: 197 lb 6.4 oz (89.54 kg)  Alert and oriented. Skin warm and dry. Oral mucosa is moist.   . Sclera anicteric, conjunctivae is pink. Thyroid not enlarged. No cervical lymphadenopathy. Lungs clear. Heart regular rate and rhythm.  Abdomen is soft. Bowel sounds are positive. No hepatomegaly. No abdominal masses felt. Slight suprapubic tenderness  No edema to lower extremities.        Assessment:   Suprapubic  pain which comes and goes. Not related to her BMs. ? IBS. She tells me her last colonoscopy was in 2000 and was normal. Poss UTI    Plan:   Screening colonoscopy. I am not going to prescribe any antispasmodics till after the colonoscopy Urinalysis today

## 2012-01-17 NOTE — Telephone Encounter (Signed)
Patient needs trilyte 

## 2012-01-17 NOTE — Patient Instructions (Addendum)
Colonoscopy. Urinalysis

## 2012-01-18 ENCOUNTER — Telehealth (HOSPITAL_COMMUNITY): Payer: Self-pay | Admitting: Psychiatry

## 2012-01-18 LAB — URINALYSIS
Bilirubin Urine: NEGATIVE
Glucose, UA: NEGATIVE mg/dL
Leukocytes, UA: NEGATIVE
Protein, ur: NEGATIVE mg/dL
Specific Gravity, Urine: 1.02 (ref 1.005–1.030)
Urobilinogen, UA: 0.2 mg/dL (ref 0.0–1.0)
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
## 2012-01-21 ENCOUNTER — Ambulatory Visit (INDEPENDENT_AMBULATORY_CARE_PROVIDER_SITE_OTHER): Payer: MEDICAID | Admitting: Psychiatry

## 2012-01-21 DIAGNOSIS — F329 Major depressive disorder, single episode, unspecified: Secondary | ICD-10-CM
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
## 2012-01-21 NOTE — Patient Instructions (Signed)
Discussed orally 

## 2012-01-22 ENCOUNTER — Ambulatory Visit (INDEPENDENT_AMBULATORY_CARE_PROVIDER_SITE_OTHER): Payer: Medicare Other | Admitting: Psychiatry

## 2012-01-22 ENCOUNTER — Telehealth (HOSPITAL_COMMUNITY): Payer: Self-pay | Admitting: Psychiatry

## 2012-01-22 ENCOUNTER — Encounter (HOSPITAL_COMMUNITY): Payer: Self-pay | Admitting: Psychiatry

## 2012-01-22 VITALS — Wt 202.2 lb

## 2012-01-22 DIAGNOSIS — R52 Pain, unspecified: Secondary | ICD-10-CM

## 2012-01-22 DIAGNOSIS — G4700 Insomnia, unspecified: Secondary | ICD-10-CM

## 2012-01-22 DIAGNOSIS — F329 Major depressive disorder, single episode, unspecified: Secondary | ICD-10-CM

## 2012-01-22 DIAGNOSIS — G8929 Other chronic pain: Secondary | ICD-10-CM

## 2012-01-22 DIAGNOSIS — M549 Dorsalgia, unspecified: Secondary | ICD-10-CM

## 2012-01-22 DIAGNOSIS — F419 Anxiety disorder, unspecified: Secondary | ICD-10-CM

## 2012-01-22 DIAGNOSIS — F339 Major depressive disorder, recurrent, unspecified: Secondary | ICD-10-CM

## 2012-01-22 MED ORDER — ZIPRASIDONE HCL 80 MG PO CAPS: 80.0000 mg | ORAL_CAPSULE | Freq: Two times a day (BID) | ORAL | Status: DC

## 2012-01-22 MED ORDER — LIDOCAINE 5 % EX PTCH: 1.0000 | MEDICATED_PATCH | Freq: Two times a day (BID) | CUTANEOUS | Status: DC

## 2012-01-22 MED ORDER — GABAPENTIN 400 MG PO CAPS: 800.0000 mg | ORAL_CAPSULE | Freq: Four times a day (QID) | ORAL | Status: DC

## 2012-01-22 MED ORDER — HYDROXYZINE PAMOATE 50 MG PO CAPS: ORAL_CAPSULE | ORAL | Status: DC

## 2012-01-22 MED ORDER — AMITRIPTYLINE HCL 100 MG PO TABS: 100.0000 mg | ORAL_TABLET | Freq: Every day | ORAL | Status: DC

## 2012-01-22 MED ORDER — METHOCARBAMOL 500 MG PO TABS: 500.0000 mg | ORAL_TABLET | Freq: Four times a day (QID) | ORAL | Status: DC

## 2012-01-22 NOTE — Patient Instructions (Signed)
Try higher dose of Geodon and Neurontin and add Elavil for better control of anxiety and pain.  CUT BACK/CUT OUT on sugar and carbohydrates, that means very limited fruits and starchy vegetables and very limited grains, breads  The goal is low GLYCEMIC INDEX.  CUT OUT all wheat, rye, or barley for the GLUTEN in them.  HIGH fat and LOW carbohydrate diet is the KEY.  Eat avocados, eggs, lean meat like grass fed beef and chicken  Nuts and seeds would be good foods as well.   Stevia is an excellent sweetener.  Safe for the brain.   Almond butter is awesome.  Check out all this on the Internet.  Call if problems or concerns.
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
## 2012-01-22 NOTE — Telephone Encounter (Signed)
Phone message completed in the phone message section.  

## 2012-01-23 ENCOUNTER — Telehealth: Payer: Self-pay | Admitting: Family Medicine

## 2012-01-23 ENCOUNTER — Encounter (HOSPITAL_COMMUNITY): Payer: Self-pay | Admitting: Pharmacy Technician

## 2012-01-23 NOTE — Telephone Encounter (Signed)
States she has a small place on her tongue and blood in her mouth when she woke up. York Spaniel it was a very minimal amount of blood now. Advised to call back if the bleeding started back or anything new developed

## 2012-01-25 ENCOUNTER — Encounter (HOSPITAL_COMMUNITY)
Admission: RE | Disposition: A | Discharge status: Home / Self Care | Payer: Self-pay | Source: Ambulatory Visit | Attending: Internal Medicine

## 2012-01-25 ENCOUNTER — Ambulatory Visit (HOSPITAL_COMMUNITY)
Admission: RE | Admit: 2012-01-25 | Discharge: 2012-01-25 | Disposition: A | Discharge status: Home / Self Care | Payer: Medicare Other | Source: Ambulatory Visit | Attending: Internal Medicine | Admitting: Internal Medicine

## 2012-01-25 ENCOUNTER — Encounter (HOSPITAL_COMMUNITY): Payer: Self-pay | Admitting: *Deleted

## 2012-01-25 DIAGNOSIS — J4489 Other specified chronic obstructive pulmonary disease: Secondary | ICD-10-CM | POA: Insufficient documentation

## 2012-01-25 DIAGNOSIS — K5730 Diverticulosis of large intestine without perforation or abscess without bleeding: Secondary | ICD-10-CM | POA: Insufficient documentation

## 2012-01-25 DIAGNOSIS — Z1211 Encounter for screening for malignant neoplasm of colon: Secondary | ICD-10-CM

## 2012-01-25 DIAGNOSIS — J449 Chronic obstructive pulmonary disease, unspecified: Secondary | ICD-10-CM | POA: Insufficient documentation

## 2012-01-25 HISTORY — PX: COLONOSCOPY: SHX5424

## 2012-01-25 SURGERY — COLONOSCOPY
Anesthesia: Moderate Sedation

## 2012-01-25 MED ORDER — SODIUM CHLORIDE 0.45 % IV SOLN
INTRAVENOUS | Status: DC
  Administered 2012-01-25: 1000 mL via INTRAVENOUS

## 2012-01-25 MED ORDER — MEPERIDINE HCL 50 MG/ML IJ SOLN
INTRAMUSCULAR | Status: DC | PRN
  Administered 2012-01-25 (×2): 25 mg via INTRAVENOUS

## 2012-01-25 MED ORDER — STERILE WATER FOR IRRIGATION IR SOLN
Status: DC | PRN
  Administered 2012-01-25: 14:00:00

## 2012-01-25 MED ORDER — MIDAZOLAM HCL 5 MG/5ML IJ SOLN
INTRAMUSCULAR | Status: DC | PRN
  Administered 2012-01-25 (×4): 2 mg via INTRAVENOUS

## 2012-01-25 MED ORDER — MIDAZOLAM HCL 5 MG/5ML IJ SOLN
INTRAMUSCULAR | Status: AC
  Filled 2012-01-25: qty 10

## 2012-01-25 MED ORDER — HYOSCYAMINE SULFATE 0.125 MG SL SUBL: 0.1250 mg | SUBLINGUAL_TABLET | Freq: Four times a day (QID) | SUBLINGUAL | Status: DC | PRN

## 2012-01-25 MED ORDER — MEPERIDINE HCL 50 MG/ML IJ SOLN
INTRAMUSCULAR | Status: AC
  Filled 2012-01-25: qty 1

## 2012-01-25 NOTE — H&P (Signed)
Tonya Sullivan is an 57 y.o. female.   Chief Complaint: Patient is here for colonoscopy. HPI: Patient is 57 year old Caucasian female who presents for screening colonoscopy. Her last exam was in 2000. She denies diarrhea constipation rectal bleeding. She has intermittent pain primarily at [facility] and workup has been negative including abdominopelvic CT last month. She also complains of chronic low back pain. She has 5 back surgeries. Family history is negative for colorectal carcinoma.  Past Medical History  Diagnosis Date  . COPD (chronic obstructive pulmonary disease)   . Depression   . Chronic back pain     DDD, disc bulge, radiculopathy, spinal stenosis  . Hyperlipidemia   . Anxiety   . Bipolar disorder, unspecified   . Carotid artery calcification   . Melanoma     removed 1 week ago at Dr Laurena Slimmer office  . Obsessive-compulsive disorder     Past Surgical History  Procedure Date  . Appendectomy   . Tonsillectomy   . Cholecystectomy   . Lump left breast     removed- benign  . Abdominal hysterectomy     tubal pregnancy  . Inguinal hernia repair   . Stomach surgery     ?holes in esophgous  . Ectopic pregnancy surgery   . Back surgery     x 5;1984;1989;;1999;2000;2010    Family History  Problem Relation Age of Onset  . Hypertension Mother   . Heart disease Mother   . Heart attack Mother   . Anxiety disorder Mother   . Cancer Father     lung cancer  . Heart attack Father   . Alcohol abuse Father   . Hypertension Brother   . Alcohol abuse Brother   . Bipolar disorder Brother   . ADD / ADHD Brother   . Drug abuse Brother   . OCD Brother   . Alcohol abuse Sister   . Bipolar disorder Sister   . Anxiety disorder Sister   . ADD / ADHD Sister   . Drug abuse Sister   . Physical abuse Sister   . Sexual abuse Sister   . Alcohol abuse Brother   . Bipolar disorder Brother   . ADD / ADHD Brother   . Drug abuse Brother   . Alcohol abuse Sister   . Bipolar disorder Sister     . Anxiety disorder Sister   . ADD / ADHD Sister   . Physical abuse Sister   . Sexual abuse Sister   . Alcohol abuse Brother   . Bipolar disorder Brother   . ADD / ADHD Brother   . Bipolar disorder Brother   . ADD / ADHD Brother   . Alcohol abuse Brother   . Paranoid behavior Brother   . Physical abuse Brother   . Sexual abuse Brother   . Bipolar disorder Maternal Aunt   . Bipolar disorder Paternal Aunt   . Bipolar disorder Maternal Uncle   . Bipolar disorder Paternal Uncle   . Bipolar disorder Maternal Grandfather   . Alcohol abuse Maternal Grandfather   . Bipolar disorder Maternal Grandmother   . Alcohol abuse Maternal Grandmother   . Anxiety disorder Maternal Grandmother   . Dementia Maternal Grandmother   . Bipolar disorder Paternal Grandfather   . Alcohol abuse Paternal Grandfather   . Bipolar disorder Paternal Grandmother   . Alcohol abuse Paternal Grandmother   . Drug abuse Paternal Grandmother   . Bipolar disorder Maternal Uncle   . Schizophrenia Neg Hx   . Seizures  Neg Hx    Social History:  reports that she has been smoking Cigarettes.  She has a 40 pack-year smoking history. She has never used smokeless tobacco. She reports that she does not drink alcohol or use illicit drugs.  Allergies:  Allergies  Allergen Reactions  . Codeine Anaphylaxis  . Cyclobenzaprine Anaphylaxis  . Darvocet (Propoxyphene-Acetaminophen) Anaphylaxis  . Abilify (Aripiprazole) Other (See Comments)    tremors  . Adhesive (Tape) Hives  . Dilaudid (Hydromorphone Hcl) Nausea And Vomiting    Only the pills.   . Iodine Hives  . Levofloxacin Hives  . Prednisone Hives  . Sulfa Antibiotics Itching    Medications Prior to Admission  Medication Sig Dispense Refill  . albuterol (PROVENTIL HFA) 108 (90 BASE) MCG/ACT inhaler Inhale 2 puffs into the lungs every 6 (six) hours as needed. For shortness of breath      . amitriptyline (ELAVIL) 100 MG tablet Take 100 mg by mouth at bedtime.      .  cyanocobalamin 500 MCG tablet Take 500 mcg by mouth daily.      . Diphenhyd-Hydrocort-Nystatin (FIRST-DUKES MOUTHWASH) SUSP Swish every 4 hours as needed  1 Bottle  1  . DULoxetine (CYMBALTA) 30 MG capsule Take 1 capsule (30 mg total) by mouth 2 (two) times daily.  60 capsule  2  . gabapentin (NEURONTIN) 400 MG capsule Take 2-3 capsules (800-1,200 mg total) by mouth 4 (four) times daily.  360 capsule  2  . hydrOXYzine (VISTARIL) 50 MG capsule 1-2 tablets 4 times a day for anxiety  240 capsule  2  . lidocaine (LIDODERM) 5 % Place 1 patch onto the skin every 12 (twelve) hours. Remove & Discard patch within 12 hours or as directed by MD  30 patch  2  . meloxicam (MOBIC) 7.5 MG tablet Take 7.5 mg by mouth 2 (two) times daily.      . methocarbamol (ROBAXIN) 500 MG tablet Take 1 tablet (500 mg total) by mouth 4 (four) times daily.  120 tablet  1  . oxyCODONE-acetaminophen (PERCOCET) 10-325 MG per tablet Take 1 tablet by mouth every 6 (six) hours as needed. For pain      . polyethylene glycol-electrolytes (NULYTELY/GOLYTELY) 420 G solution Take 4,000 mLs by mouth once.  4000 mL  0  . pravastatin (PRAVACHOL) 40 MG tablet Take 40 mg by mouth at bedtime.      Marland Kitchen tiotropium (SPIRIVA HANDIHALER) 18 MCG inhalation capsule Place 1 capsule (18 mcg total) into inhaler and inhale daily.  30 capsule  6  . ziprasidone (GEODON) 80 MG capsule Take 1 capsule (80 mg total) by mouth 2 (two) times daily with a meal.  60 capsule  2  . aspirin (ASPIRIN EC) 81 MG EC tablet Take 1 tablet (81 mg total) by mouth daily. Swallow whole.  30 tablet  12  . fish oil-omega-3 fatty acids 1000 MG capsule Take 1 g by mouth daily.      . Multiple Vitamin (MULTIVITAMIN WITH MINERALS) TABS Take 1 tablet by mouth daily.        No results found for this or any previous visit (from the past 48 hour(s)). No results found.  ROS  Blood pressure 145/85, pulse 80, temperature 98.6 F (37 C), temperature source Oral, resp. rate 19, SpO2  96.00%. Physical Exam  Constitutional: She appears well-developed and well-nourished.  HENT:  Mouth/Throat: Oropharynx is clear and moist.  Eyes: Conjunctivae normal are normal. No scleral icterus.  Neck: No thyromegaly present.  Cardiovascular:  Normal rate, regular rhythm and normal heart sounds.   Respiratory: Effort normal and breath sounds normal.  GI: Soft. She exhibits no mass. There is Tenderness: mild tenderness at RLQ.Marland Kitchen There is no rebound.       Suprapubic scar and oblique scar left abdomen  Musculoskeletal: She exhibits no edema.  Lymphadenopathy:    She has no cervical adenopathy.  Neurological: She is alert.  Skin: Skin is warm and dry.     Assessment/Plan Average risk screening colonoscopy. Chronic left-sided abdominal pain possibly referred pain.  REHMAN,NAJEEB U 01/25/2012, 1:55 PM

## 2012-01-25 NOTE — Op Note (Addendum)
COLONOSCOPY PROCEDURE REPORT  PATIENT:  Tonya Sullivan  MR#:  [id number] Birthdate:  1955/10/29, 57 y.o., female Endoscopist:  Dr. Malissa Hippo, MD Referred By:  Dr. Milinda Antis, MD  Procedure Date: 01/25/2012  Procedure:   Colonoscopy  Indications:  Patient is 57 year old Caucasian female who is undergoing average risk screening colonoscopy. She has chronic left-sided abdominal pain which is felt to be of non-GI origin.  Informed Consent:  The procedure and risks were reviewed with the patient and informed consent was obtained.  Medications:  Demerol 50 mg IV Versed 8 mg IV  Description of procedure:  After a digital rectal exam was performed, that colonoscope was advanced from the anus through the rectum and colon to the area of the cecum, ileocecal valve and appendiceal orifice. The cecum was deeply intubated. These structures were well-seen and photographed for the record. From the level of the cecum and ileocecal valve, the scope was slowly and cautiously withdrawn. The mucosal surfaces were carefully surveyed utilizing scope tip to flexion to facilitate fold flattening as needed. The scope was pulled down into the rectum where a thorough exam including retroflexion was performed.  Findings:   Prep excellent. Few scattered diverticula at sigmoid colon. No polyps or other mucosal abnormalities noted. Normal rectal mucosa and anorectal junction.  Therapeutic/Diagnostic Maneuvers Performed:  None   Complications:  None  Cecal Withdrawal Time:  10 minutes  Impression:  Examination performed to cecum. Mild sigmoid colon diverticulosis otherwise normal colonoscopy.  Recommendations:  Standard instructions given. Levsin SL one tablet 4 times a day when necessary. Next screening exam in 10 years. Patient will review her abdominal symptoms when she is seen at the pain clinic later this month.  Travus Oren U  01/25/2012 2:25 PM  CC: Dr. Milinda Antis, MD & Dr. Bonnetta Barry ref.  provider found

## 2012-01-29 ENCOUNTER — Ambulatory Visit: Payer: Medicare Other | Admitting: Family Medicine

## 2012-01-29 ENCOUNTER — Encounter (HOSPITAL_COMMUNITY): Payer: Self-pay | Admitting: Internal Medicine

## 2012-01-29 ENCOUNTER — Telehealth (HOSPITAL_COMMUNITY): Payer: Self-pay | Admitting: *Deleted

## 2012-01-29 ENCOUNTER — Ambulatory Visit (INDEPENDENT_AMBULATORY_CARE_PROVIDER_SITE_OTHER): Payer: 59 | Admitting: Psychiatry

## 2012-01-29 DIAGNOSIS — F339 Major depressive disorder, recurrent, unspecified: Secondary | ICD-10-CM

## 2012-01-29 DIAGNOSIS — F329 Major depressive disorder, single episode, unspecified: Secondary | ICD-10-CM

## 2012-01-29 NOTE — Progress Notes (Signed)
Patient:  Tonya Sullivan   DOB: July 22, 1955  MR Number: [id number]  [facility]  [address] [place] [place],  [place], [address]  Start: Tuesday 01/29/2012 2:00 PM End: Tuesday 01/29/2012 2:50 PM  Provider/Observer:     Florencia Reasons, MSW, LCSW   Chief Complaint:      Chief Complaint  Patient presents with  . Depression    Reason For Service:     The patient was referred for services by psychiatrist Dr. Lolly Mustache due to patient experiencing anxiety, stress, depression, and mood swings. She has a long-standing history of depression beginning in adolescence. Patient also has significant trauma history. Patient also is experiencing grief and loss issues due to to the death of her husband in 2010-10-12. Patient is seen today for follow up appointment.   Interventions Strategy:  Supportive therapy,  cognitive behavioral therapy  Participation Level:   Active  Participation Quality:  Appropriate      Behavioral Observation:  Well goomed, Alert, anxious   Current Psychosocial Factors: Patient reports increased stress in the  relationship with her mother.  Content of Session:   Reviewing symptoms, processing feelings, reviewing ways to set and maintain boundaries in the relationship with her son, identifying relaxation techniques  Current Status:   Patient reports continued anxiety and sleep difficulty. She denies suicidal and homicidal ideations.  Patient Progress:    Fair. The patient reports continued sleep difficulty and states waking up every 4 hours during the night. She also reports dry mouth and shaking. Patient agrees to discuss concerns with psychiatrist covering as Dr. Dan Humphreys is out of the office this week. Patient reports increased worry about the relationship with her son and his family as they have become more distant per patient's report. Therapist works with patient to identify and challenge cognitive distortions. Therapist also works with patient to discuss  boundaries in her relationship with her son regarding financial issues. Patient also reports increased thoughts about her trauma history. Therapist and patient identify triggers and review relaxation techniques including diaphragmatic breathing,  progressive muscle relaxation, and mindfulness using breath awareness.     Target Goals:   1. Improving mood as evidenced by resuming normal interest in activities and increased social involvement. 2. Increase self acceptance and improve self-esteem as evidenced by patient making positive statements about self. 3.  Improve coping and relaxation techniques. 4. Process and resolve grief and loss issues.   Last Reviewed:   04/06/2011  Goals Addressed Today:    Goals 1 and 3  Impression/Diagnosis:   The patient presents with a long-standing history of recurrent depression, mood swings, and a significant trauma history.  Diagnosis: Major Depressive Disorder  Diagnosis:  Axis I:  Major Depressive Disorder           Axis II: Deferred

## 2012-01-29 NOTE — Patient Instructions (Signed)
Discussed orally 

## 2012-01-29 NOTE — Telephone Encounter (Addendum)
left message for patient at mother's number per patient request with instructions from Dr.Arfeen: Reduce Geodon to 80 mg once daily Reduce Neurontin to 400 mg twice daily Stop Vistaril, Robaxin and Elavil If she continues to feel bad, contact PCP and/or go to ED Mother states she wrote it down and will get message to patient

## 2012-01-29 NOTE — Addendum Note (Signed)
Addended by: Tonny Bollman on: 01/29/2012 04:42 PM   Modules accepted: Orders

## 2012-01-29 NOTE — Telephone Encounter (Signed)
Patient does not have phone currently. States a message can be left at mother's home # [phone] Patient states that last week, Geodon was increased, Neurontin was increased, and Elavil, Vistaril and Robaxin were added to her medications. States that now she feels shaky, her speech is slurred, has trouble sleeping and her memory is not good. Requests call from Dr.Arfeen (in Dr.Walker's absence)

## 2012-01-29 NOTE — Telephone Encounter (Signed)
Recommend to stop Robaxin, Vistaril and Elavil.  Recommend to reduce Neurontin twice a day and Geodon only 1 a day.  Recommend to call us if things does not get better.

## 2012-01-31 ENCOUNTER — Emergency Department (HOSPITAL_COMMUNITY)
Admission: EM | Admit: 2012-01-31 | Discharge: 2012-01-31 | Disposition: A | Discharge status: Home / Self Care | Payer: Medicare Other | Attending: Emergency Medicine | Admitting: Emergency Medicine

## 2012-01-31 ENCOUNTER — Encounter (HOSPITAL_COMMUNITY): Payer: Self-pay | Admitting: *Deleted

## 2012-01-31 ENCOUNTER — Telehealth (HOSPITAL_COMMUNITY): Payer: Self-pay | Admitting: *Deleted

## 2012-01-31 ENCOUNTER — Encounter (INDEPENDENT_AMBULATORY_CARE_PROVIDER_SITE_OTHER): Payer: Self-pay | Admitting: *Deleted

## 2012-01-31 DIAGNOSIS — J449 Chronic obstructive pulmonary disease, unspecified: Secondary | ICD-10-CM | POA: Insufficient documentation

## 2012-01-31 DIAGNOSIS — T8140XA Infection following a procedure, unspecified, initial encounter: Secondary | ICD-10-CM | POA: Insufficient documentation

## 2012-01-31 DIAGNOSIS — F17200 Nicotine dependence, unspecified, uncomplicated: Secondary | ICD-10-CM | POA: Insufficient documentation

## 2012-01-31 DIAGNOSIS — T8149XA Infection following a procedure, other surgical site, initial encounter: Secondary | ICD-10-CM

## 2012-01-31 DIAGNOSIS — Y838 Other surgical procedures as the cause of abnormal reaction of the patient, or of later complication, without mention of misadventure at the time of the procedure: Secondary | ICD-10-CM | POA: Insufficient documentation

## 2012-01-31 DIAGNOSIS — J4489 Other specified chronic obstructive pulmonary disease: Secondary | ICD-10-CM | POA: Insufficient documentation

## 2012-01-31 DIAGNOSIS — Z8739 Personal history of other diseases of the musculoskeletal system and connective tissue: Secondary | ICD-10-CM | POA: Insufficient documentation

## 2012-01-31 DIAGNOSIS — Z7982 Long term (current) use of aspirin: Secondary | ICD-10-CM | POA: Insufficient documentation

## 2012-01-31 DIAGNOSIS — F429 Obsessive-compulsive disorder, unspecified: Secondary | ICD-10-CM | POA: Insufficient documentation

## 2012-01-31 DIAGNOSIS — F319 Bipolar disorder, unspecified: Secondary | ICD-10-CM | POA: Insufficient documentation

## 2012-01-31 DIAGNOSIS — F411 Generalized anxiety disorder: Secondary | ICD-10-CM | POA: Insufficient documentation

## 2012-01-31 DIAGNOSIS — E785 Hyperlipidemia, unspecified: Secondary | ICD-10-CM | POA: Insufficient documentation

## 2012-01-31 DIAGNOSIS — Z85820 Personal history of malignant melanoma of skin: Secondary | ICD-10-CM | POA: Insufficient documentation

## 2012-01-31 DIAGNOSIS — Z79899 Other long term (current) drug therapy: Secondary | ICD-10-CM | POA: Insufficient documentation

## 2012-01-31 DIAGNOSIS — Z8679 Personal history of other diseases of the circulatory system: Secondary | ICD-10-CM | POA: Insufficient documentation

## 2012-01-31 MED ORDER — CEPHALEXIN 500 MG PO CAPS
500.0000 mg | ORAL_CAPSULE | Freq: Once | ORAL | Status: AC
  Administered 2012-01-31: 500 mg via ORAL
  Filled 2012-01-31: qty 1

## 2012-01-31 MED ORDER — CEPHALEXIN 500 MG PO CAPS: 500.0000 mg | ORAL_CAPSULE | Freq: Four times a day (QID) | ORAL | Status: DC

## 2012-01-31 NOTE — ED Notes (Signed)
Right index finger red and swelling, states she had a skin cancer removed 2 weeks ago and area has not healed

## 2012-01-31 NOTE — Telephone Encounter (Signed)
Received faxed note from [facility] office stating pt continues to shake, have slurred speech and feels jittery. Asked that [facility] office contact patient and repeat Dr.Arfeen's instructions from yesterday:If she still feels bad after medication changes, she should seek medical care with PCP or ED

## 2012-01-31 NOTE — Telephone Encounter (Signed)
error 

## 2012-01-31 NOTE — ED Provider Notes (Signed)
History     CSN: [id number]  Arrival date & time 01/31/12  1940   First MD Initiated Contact with Patient 01/31/12 1956      Chief Complaint  Patient presents with  . Hand Pain    Patient is a 57 y.o. female presenting with hand pain. The history is provided by the patient.  Hand Pain This is a recurrent problem. The current episode started more than 1 week ago. The problem occurs constantly. The problem has been gradually worsening. Pertinent negatives include no headaches. Exacerbated by: palpation. The symptoms are relieved by rest. She has tried rest for the symptoms. The treatment provided no relief.  pt reports she had lesion removed from dorsal aspect of right index finger ("it was cancer) and she reports increased pain/burning/redness.  No drainage is reported.  No new injury.  She thinks it is swollen . She called her specialist and was not able to be seen until next week  Past Medical History  Diagnosis Date  . COPD (chronic obstructive pulmonary disease)   . Depression   . Chronic back pain     DDD, disc bulge, radiculopathy, spinal stenosis  . Hyperlipidemia   . Anxiety   . Bipolar disorder, unspecified   . Carotid artery calcification   . Melanoma     removed 1 week ago at Dr Laurena Slimmer office  . Obsessive-compulsive disorder     Past Surgical History  Procedure Date  . Appendectomy   . Tonsillectomy   . Cholecystectomy   . Lump left breast     removed- benign  . Abdominal hysterectomy     tubal pregnancy  . Inguinal hernia repair   . Stomach surgery     ?holes in esophgous  . Ectopic pregnancy surgery   . Back surgery     x 5;1984;1989;;1999;2000;2010  . Colonoscopy 01/25/2012    Procedure: COLONOSCOPY;  Surgeon: Malissa Hippo, MD;  Location: [facility];  Service: Endoscopy;  Laterality: N/A;  130    Family History  Problem Relation Age of Onset  . Hypertension Mother   . Heart disease Mother   . Heart attack Mother   . Anxiety disorder Mother    . Cancer Father     lung cancer  . Heart attack Father   . Alcohol abuse Father   . Hypertension Brother   . Alcohol abuse Brother   . Bipolar disorder Brother   . ADD / ADHD Brother   . Drug abuse Brother   . OCD Brother   . Alcohol abuse Sister   . Bipolar disorder Sister   . Anxiety disorder Sister   . ADD / ADHD Sister   . Drug abuse Sister   . Physical abuse Sister   . Sexual abuse Sister   . Alcohol abuse Brother   . Bipolar disorder Brother   . ADD / ADHD Brother   . Drug abuse Brother   . Alcohol abuse Sister   . Bipolar disorder Sister   . Anxiety disorder Sister   . ADD / ADHD Sister   . Physical abuse Sister   . Sexual abuse Sister   . Alcohol abuse Brother   . Bipolar disorder Brother   . ADD / ADHD Brother   . Bipolar disorder Brother   . ADD / ADHD Brother   . Alcohol abuse Brother   . Paranoid behavior Brother   . Physical abuse Brother   . Sexual abuse Brother   . Bipolar disorder Maternal  Aunt   . Bipolar disorder Paternal Aunt   . Bipolar disorder Maternal Uncle   . Bipolar disorder Paternal Uncle   . Bipolar disorder Maternal Grandfather   . Alcohol abuse Maternal Grandfather   . Bipolar disorder Maternal Grandmother   . Alcohol abuse Maternal Grandmother   . Anxiety disorder Maternal Grandmother   . Dementia Maternal Grandmother   . Bipolar disorder Paternal Grandfather   . Alcohol abuse Paternal Grandfather   . Bipolar disorder Paternal Grandmother   . Alcohol abuse Paternal Grandmother   . Drug abuse Paternal Grandmother   . Bipolar disorder Maternal Uncle   . Schizophrenia Neg Hx   . Seizures Neg Hx     History  Substance Use Topics  . Smoking status: Current Every Day Smoker -- 1.0 packs/day for 40 years    Types: Cigarettes  . Smokeless tobacco: Never Used     Comment: x 40 yrs  . Alcohol Use: No    OB History    Grav Para Term Preterm Abortions TAB SAB Ect Mult Living                  Review of Systems   Constitutional: Negative for fever.  Gastrointestinal: Negative for vomiting.  Skin: Positive for wound.  Neurological: Negative for headaches.    Allergies  Codeine; Cyclobenzaprine; Darvocet; Abilify; Adhesive; Dilaudid; Iodine; Levofloxacin; Prednisone; and Sulfa antibiotics  Home Medications   Current Outpatient Rx  Name  Route  Sig  Dispense  Refill  . ALBUTEROL SULFATE HFA 108 (90 BASE) MCG/ACT IN AERS   Inhalation   Inhale 2 puffs into the lungs every 6 (six) hours as needed. For shortness of breath         . AMITRIPTYLINE HCL 100 MG PO TABS   Oral   Take 100 mg by mouth at bedtime.         . ASPIRIN 81 MG PO TBEC   Oral   Take 1 tablet (81 mg total) by mouth daily. Swallow whole.   30 tablet   12   . CEPHALEXIN 500 MG PO CAPS   Oral   Take 1 capsule (500 mg total) by mouth 4 (four) times daily.   40 capsule   0   . CYANOCOBALAMIN 500 MCG PO TABS   Oral   Take 500 mcg by mouth daily.         Marland Kitchen FIRST-DUKES MOUTHWASH MT SUSP      Swish every 4 hours as needed   1 Bottle   1   . DULOXETINE HCL 30 MG PO CPEP   Oral   Take 1 capsule (30 mg total) by mouth 2 (two) times daily.   60 capsule   2   . OMEGA-3 FATTY ACIDS 1000 MG PO CAPS   Oral   Take 1 g by mouth daily.         Marland Kitchen GABAPENTIN 400 MG PO CAPS   Oral   Take 2-3 capsules (800-1,200 mg total) by mouth 4 (four) times daily.   360 capsule   2   . HYDROXYZINE PAMOATE 50 MG PO CAPS      1-2 tablets 4 times a day for anxiety   240 capsule   2   . HYOSCYAMINE SULFATE 0.125 MG SL SUBL   Sublingual   Place 1 tablet (0.125 mg total) under the tongue every 6 (six) hours as needed for cramping.   60 tablet   1   . LIDOCAINE 5 %  EX PTCH   Transdermal   Place 1 patch onto the skin every 12 (twelve) hours. Remove & Discard patch within 12 hours or as directed by MD   30 patch   2   . MELOXICAM 7.5 MG PO TABS   Oral   Take 7.5 mg by mouth 2 (two) times daily.         Marland Kitchen METHOCARBAMOL  500 MG PO TABS   Oral   Take 1 tablet (500 mg total) by mouth 4 (four) times daily.   120 tablet   1   . ADULT MULTIVITAMIN W/MINERALS CH   Oral   Take 1 tablet by mouth daily.         . OXYCODONE-ACETAMINOPHEN 10-325 MG PO TABS   Oral   Take 1 tablet by mouth every 6 (six) hours as needed. For pain         . PRAVASTATIN SODIUM 40 MG PO TABS   Oral   Take 40 mg by mouth at bedtime.         Marland Kitchen TIOTROPIUM BROMIDE MONOHYDRATE 18 MCG IN CAPS   Inhalation   Place 1 capsule (18 mcg total) into inhaler and inhale daily.   30 capsule   6   . ZIPRASIDONE HCL 80 MG PO CAPS   Oral   Take 1 capsule (80 mg total) by mouth 2 (two) times daily with a meal.   60 capsule   2     BP 129/74  Pulse 98  Temp 98.4 F (36.9 C) (Oral)  Resp 20  Ht 5\' 7"  (1.702 m)  Wt 194 lb (87.998 kg)  BMI 30.38 kg/m2  SpO2 99%  Physical Exam CONSTITUTIONAL: Well developed/well nourished HEAD AND FACE: Normocephalic/atraumatic EYES: EOMI/ ENMT: Mucous membranes moist NECK: supple no meningeal signs CV: S1/S2 noted, no murmurs/rubs/gallops noted LUNGS: Lungs are clear to auscultation bilaterally, no apparent distress ABDOMEN: soft, nontender, no rebound or guarding NEURO: Pt is awake/alert, moves all extremitiesx4 EXTREMITIES: pulses normal, full ROM Right index finger - on dorsal surface she has area of recent biopsy with localized erythema at wound edges with granulation tissue at center.  No drainage/erythema.  No crepitance is noted. She can fully flex/extend finger without difficulty.  The rest of finger is not edematous or erythematous SKIN: warm, color normal   ED Course  Procedures   1. Postoperative wound infection       MDM  Nursing notes including past medical history and social history reviewed and considered in documentation  Will start keflex and advised close f/u with specialist tomorrow.  No bone/tendon exposed at wound as granulation tissue noted        Joya Gaskins, MD 01/31/12 2047

## 2012-02-01 ENCOUNTER — Encounter: Payer: Self-pay | Admitting: Family Medicine

## 2012-02-01 ENCOUNTER — Ambulatory Visit (INDEPENDENT_AMBULATORY_CARE_PROVIDER_SITE_OTHER): Payer: Medicare Other | Admitting: Family Medicine

## 2012-02-01 VITALS — BP 130/76 | HR 90 | Resp 18 | Ht 67.0 in | Wt 196.0 lb

## 2012-02-01 DIAGNOSIS — L089 Local infection of the skin and subcutaneous tissue, unspecified: Secondary | ICD-10-CM

## 2012-02-01 DIAGNOSIS — F319 Bipolar disorder, unspecified: Secondary | ICD-10-CM

## 2012-02-01 NOTE — Progress Notes (Signed)
  Subjective:    Patient ID: Tonya Sullivan, female    DOB: 1955-08-02, 57 y.o.   MRN: [id number]  HPI  Patient here to follow up ED visit for infection status post removal of a melanoma off of her right hand second digit. He was started on Keflex she was unable to get in contact with the dermatologist  She's having difficulty with her psychiatric medication she feels very anxious and jittery, she is unable to sleep she feels worse than she did before she started all the new medications. She denies suicidal ideation denies hallucinations she did call to discuss this with them but never heard anything back she was given multiple new medications over the past 4 weeks she's currently on Vistaril, Robaxin, Elavil, Geodon, Neurontin, Cymbalta.   Review of Systems  - per above  GEN- denies fatigue, fever, weight loss,weakness, recent illness HEENT- denies eye drainage, change in vision, nasal discharge, CVS- denies chest pain, palpitations RESP- denies SOB, cough, wheeze MSK- + joint pain, muscle aches, injury Neuro- denies headache, dizziness, syncope, seizure activity       Objective:   Physical Exam  GEN-NAD,alert and oriented x 3 Ext- Right hand index finger - open lesion size of nickle, mild drainage, no odor, surrouding erythema, TTP of finger Psych- very anxious, depressed affect, speech a little slurred at times, good eye contact, no hallucinations, well groomed      Assessment & Plan:

## 2012-02-01 NOTE — Patient Instructions (Addendum)
Call Dr.Rehaman on Monday to see what other medication can be taken Continue antibiotics, call Dr. Margo Aye on Monday about your finger Decrease the Geodon 80mg  to 1 a day  Stop the vistaril Decrease neurontin to 400mg  twice a day Continue elavil  Decrease the Robaxin to 1 tablet twice a day  Keep appointment with Dr. Dan Humphreys on Feb 6th

## 2012-02-03 DIAGNOSIS — L089 Local infection of the skin and subcutaneous tissue, unspecified: Secondary | ICD-10-CM | POA: Insufficient documentation

## 2012-02-03 NOTE — Assessment & Plan Note (Signed)
I called [facility] and spoke with therapist however she could not give any med advised, called [facility] where Dr. Lolly Mustache works but he was not available, I reviewed the last phone note between nurse and Dr. Lolly Mustache who was filling in for pt psychiatrist Dr. Dan Humphreys . I have decided to decrease Geodon to 80mg  daily, neurontin to BID dosing, continue elavil, stop vistaril, decrease robaxin to BID, will not stop due to her chronic back pain etc. Continue cymbalta, she has f/u with psychiatrist on Friday, I was concerned about stopping all the medications at once, because of her current anxious state, no SI, no hallucinations

## 2012-02-03 NOTE — Assessment & Plan Note (Addendum)
Infection of digit wound s/p cancerous mole removal 2 weeks ago, on antibiotics, she will call Monday for F/U with dermatology  Approx 15 minutes spent with pt > 50% of time spent on medication management, calling on pt behalf for Medication reconciliation and counseling

## 2012-02-04 ENCOUNTER — Telehealth (INDEPENDENT_AMBULATORY_CARE_PROVIDER_SITE_OTHER): Payer: Self-pay | Admitting: *Deleted

## 2012-02-04 NOTE — Telephone Encounter (Signed)
Tonya Sullivan said the medication sent in after the procedue wasn't coverd by her insurance. She doesn't remember the name of the medicine. Tonya Sullivan would like to see if there is something else Dr. Karilyn Cota could call in? Her return phone number is [phone].

## 2012-02-05 ENCOUNTER — Other Ambulatory Visit: Payer: Self-pay | Admitting: Family Medicine

## 2012-02-06 NOTE — Telephone Encounter (Signed)
Phone message completed in the phone message section.  

## 2012-02-06 NOTE — Telephone Encounter (Signed)
No answer at home #

## 2012-02-06 NOTE — Telephone Encounter (Signed)
No answer at home and the home phone is the wrong number

## 2012-02-06 NOTE — Telephone Encounter (Signed)
I am unable to leave a message. The home phone is the wrong number

## 2012-02-06 NOTE — Telephone Encounter (Signed)
Forward to [name]. 

## 2012-02-06 NOTE — Telephone Encounter (Signed)
[name] - Would you please look at this? I have attached Dr.Rehman's recommendation post procedure.  Standard instructions given.  Levsin SL one tablet 4 times a day when necessary.  Next screening exam in 10 years.  Patient will review her abdominal symptoms when she is seen at the pain clinic later this month.

## 2012-02-07 ENCOUNTER — Encounter (HOSPITAL_COMMUNITY): Payer: Self-pay | Admitting: Psychiatry

## 2012-02-07 ENCOUNTER — Ambulatory Visit (HOSPITAL_COMMUNITY): Payer: Self-pay | Admitting: Psychiatry

## 2012-02-07 ENCOUNTER — Ambulatory Visit (INDEPENDENT_AMBULATORY_CARE_PROVIDER_SITE_OTHER): Payer: Medicare Other | Admitting: Psychiatry

## 2012-02-07 VITALS — Wt 195.0 lb

## 2012-02-07 DIAGNOSIS — G8929 Other chronic pain: Secondary | ICD-10-CM

## 2012-02-07 DIAGNOSIS — F329 Major depressive disorder, single episode, unspecified: Secondary | ICD-10-CM

## 2012-02-07 DIAGNOSIS — F419 Anxiety disorder, unspecified: Secondary | ICD-10-CM

## 2012-02-07 DIAGNOSIS — R52 Pain, unspecified: Secondary | ICD-10-CM

## 2012-02-07 MED ORDER — LIDOCAINE 5 % EX PTCH: 1.0000 | MEDICATED_PATCH | Freq: Two times a day (BID) | CUTANEOUS | Status: DC

## 2012-02-07 MED ORDER — ZIPRASIDONE HCL 80 MG PO CAPS: 80.0000 mg | ORAL_CAPSULE | Freq: Two times a day (BID) | ORAL | Status: DC

## 2012-02-07 MED ORDER — DULOXETINE HCL 30 MG PO CPEP: 30.0000 mg | ORAL_CAPSULE | Freq: Two times a day (BID) | ORAL | Status: DC

## 2012-02-07 NOTE — Patient Instructions (Signed)
Keep everything up.  Yoga is a very helpful exercise method.  On TV or on line [employer] is a source of high quality information about yoga and videos on yoga.  Tonya Sullivan is the world's number one video yoga instructor according to some experts.  There are exceptional health benefits that can be achieved through yoga.  The main principles of yoga is acceptance, no competition, no comparison, and no judgement.  It is exceptional in helping people meditate and get to a very relaxed state.   Relaxation is the ultimate solution for you.  You can seek it through tub baths, bubble baths, essential oils or incense, walking or chatting with friends, listening to soft music, watching a candle burn and just letting all thoughts go and appreciating the true essence of the Creator.   Keep the AA meetings up  The one in [place] is at the [facility] on [address]  The meeting is at 7 PM on Tuesdays.   Call if problems or concerns.

## 2012-02-07 NOTE — Progress Notes (Signed)
[facility] [address] Progress Note Tonya Sullivan MRN: [id number] DOB: 10/20/1955 Age: 57 y.o.  Date: 02/07/2012 Start Time: 1:15 PM End Time: 1:25 PM  Chief Complaint: Chief Complaint  Patient presents with  . Depression  . Follow-up  . Medication Refill   Subjective: "I've got good day and bad days". Depression 8/10 and Anxiety 5/10, where 1 is the best and 10 is the worst. Pain is 7/10  Pt returns of appointment.  Pt reports that she is compliant with the psychotropic medications with fair benefit and no noticeable side effects.  She is sleeping better and that is making a difference.  She is doing well on the increase of Geodon to 80 mg BID.  She stopped the Neurontin and Robaxin, but is noting pretty good pain control with Cymbalta, Lidoderm, and Mobic.   Current psychiatric medication Cymbalta 30 mg twice a day Geodon 80 mg twice a day Lidoderm q 12 hours  Mobic 7.5 mg twice a day  Past psychiatric history Patient endorse history of depression since age 53.  She has been at least 2 psychiatric admission due to suicidal thoughts.  She denies any history of suicidal attempt however endorse endorse history of paranoia and severe depression.  Her last psychiatric admission was January 2012 due to significant depression.  In the past she had tried gabapentin, Risperdal, trazodone, Abilify , Effexor, Valium, Lamictal, Klonopin and recently Wellbutrin.    Medical history She has history of history of chronic back pain, hyperlipidemia, coronary artery calcification and chest pain.  Family History: family history includes ADD / ADHD in her brothers and sisters; Alcohol abuse in her brothers, father, maternal grandfather, maternal grandmother, paternal grandfather, paternal grandmother, and sisters; Anxiety disorder in her maternal grandmother, mother, and sisters; Bipolar disorder in her brothers, maternal aunt, maternal grandfather, maternal grandmother, maternal uncles,  paternal aunt, paternal grandfather, paternal grandmother, paternal uncle, and sisters; Cancer in her father; Dementia in her maternal grandmother; Drug abuse in her brothers, paternal grandmother, and sister; Heart attack in her father and mother; Heart disease in her mother; Hypertension in her brother and mother; OCD in her brother; Paranoid behavior in her brother; Physical abuse in her brother and sisters; and Sexual abuse in her brother and sisters.  There is no history of Schizophrenia and Seizures.  Mental status examination Patient is casually dressed and fairly groomed. She appears calm cooperative and relevant in conversation.  She maintained good eye contact.  Her speech is soft clear and coherent.  Her thought processes logical goal-directed.  She described her mood is good and her affect is improved from the past.  She denies any auditory or visual hallucination.  She denies any active or passive suicidal thoughts or homicidal thoughts.  There is some paranoia but there were no delusion present.  Her attention and concentration is better.  She's alert and oriented x3.  Her insight judgment and impulse control is okay.  Lab Results:  Recent Results (from the past 8736 hour(s))  CBC WITH DIFFERENTIAL   Collection Time   02/08/11  3:05 PM      Component Value Range   WBC 6.9  4.0 - 10.5 K/uL   RBC 5.11  3.87 - 5.11 MIL/uL   Hemoglobin 16.0 (*) 12.0 - 15.0 g/dL   HCT 62.1 (*) 30.8 - 65.7 %   MCV 93.9  78.0 - 100.0 fL   MCH 31.3  26.0 - 34.0 pg   MCHC 33.3  30.0 - 36.0 g/dL  RDW 12.9  11.5 - 15.5 %   Platelets 261  150 - 400 K/uL   Neutrophils Relative 65  43 - 77 %   Neutro Abs 4.5  1.7 - 7.7 K/uL   Lymphocytes Relative 27  12 - 46 %   Lymphs Abs 1.8  0.7 - 4.0 K/uL   Monocytes Relative 7  3 - 12 %   Monocytes Absolute 0.5  0.1 - 1.0 K/uL   Eosinophils Relative 1  0 - 5 %   Eosinophils Absolute 0.1  0.0 - 0.7 K/uL   Basophils Relative 0  0 - 1 %   Basophils Absolute 0.0  0.0 -  0.1 K/uL   Smear Review Criteria for review not met    COMPREHENSIVE METABOLIC PANEL   Collection Time   02/08/11  3:05 PM      Component Value Range   Sodium 141  135 - 145 mEq/L   Potassium 4.9  3.5 - 5.3 mEq/L   Chloride 100  96 - 112 mEq/L   CO2 29  19 - 32 mEq/L   Glucose, Bld 123 (*) 70 - 99 mg/dL   BUN 14  6 - 23 mg/dL   Creat 1.61  0.96 - 0.45 mg/dL   Total Bilirubin 0.3  0.3 - 1.2 mg/dL   Alkaline Phosphatase 77  39 - 117 U/L   AST 20  0 - 37 U/L   ALT 19  0 - 35 U/L   Total Protein 7.5  6.0 - 8.3 g/dL   Albumin 4.5  3.5 - 5.2 g/dL   Calcium 40.9  8.4 - 81.1 mg/dL  HEMOGLOBIN B1Y   Collection Time   02/08/11  3:05 PM      Component Value Range   Hemoglobin A1C 5.7 (*) <5.7 %   Mean Plasma Glucose 117 (*) <117 mg/dL  POCT URINALYSIS DIPSTICK   Collection Time   02/19/11  9:26 AM      Component Value Range   Color, UA yellow     Clarity, UA clear     Glucose, UA neg     Bilirubin, UA small     Ketones, UA trace     Spec Grav, UA 1.025     Blood, UA large     pH, UA 5.0     Protein, UA neg     Urobilinogen, UA 0.2     Nitrite, UA neg     Leukocytes, UA Negative    CBC   Collection Time   02/25/11 12:04 PM      Component Value Range   WBC 6.8  4.0 - 10.5 K/uL   RBC 4.94  3.87 - 5.11 MIL/uL   Hemoglobin 15.4 (*) 12.0 - 15.0 g/dL   HCT 78.2  95.6 - 21.3 %   MCV 92.3  78.0 - 100.0 fL   MCH 31.2  26.0 - 34.0 pg   MCHC 33.8  30.0 - 36.0 g/dL   RDW 08.6  57.8 - 46.9 %   Platelets 226  150 - 400 K/uL  DIFFERENTIAL   Collection Time   02/25/11 12:04 PM      Component Value Range   Neutrophils Relative 55  43 - 77 %   Neutro Abs 3.7  1.7 - 7.7 K/uL   Lymphocytes Relative 33  12 - 46 %   Lymphs Abs 2.2  0.7 - 4.0 K/uL   Monocytes Relative 9  3 - 12 %   Monocytes Absolute 0.6  0.1 - 1.0 K/uL   Eosinophils Relative 2  0 - 5 %   Eosinophils Absolute 0.1  0.0 - 0.7 K/uL   Basophils Relative 1  0 - 1 %   Basophils Absolute 0.0  0.0 - 0.1 K/uL  BASIC METABOLIC PANEL    Collection Time   02/25/11 12:04 PM      Component Value Range   Sodium 138  135 - 145 mEq/L   Potassium 4.3  3.5 - 5.1 mEq/L   Chloride 102  96 - 112 mEq/L   CO2 27  19 - 32 mEq/L   Glucose, Bld 100 (*) 70 - 99 mg/dL   BUN 13  6 - 23 mg/dL   Creatinine, Ser 1.61  0.50 - 1.10 mg/dL   Calcium 9.6  8.4 - 09.6 mg/dL   GFR calc non Af Amer 72 (*) >90 mL/min   GFR calc Af Amer 83 (*) >90 mL/min  POCT I-STAT TROPONIN I   Collection Time   02/25/11 12:57 PM      Component Value Range   Troponin i, poc 0.00  0.00 - 0.08 ng/mL   Comment 3           TSH   Collection Time   02/25/11  5:45 PM      Component Value Range   TSH 0.971  0.350 - 4.500 uIU/mL  CARDIAC PANEL(CRET KIN+CKTOT+MB+TROPI)   Collection Time   02/25/11  5:45 PM      Component Value Range   Total CK 53  7 - 177 U/L   CK, MB 1.5  0.3 - 4.0 ng/mL   Troponin I 0.33 (*) <0.30 ng/mL   Relative Index RELATIVE INDEX IS INVALID  0.0 - 2.5  CARDIAC PANEL(CRET KIN+CKTOT+MB+TROPI)   Collection Time   02/26/11  1:50 AM      Component Value Range   Total CK 55  7 - 177 U/L   CK, MB 1.7  0.3 - 4.0 ng/mL   Troponin I 0.33 (*) <0.30 ng/mL   Relative Index RELATIVE INDEX IS INVALID  0.0 - 2.5  COMPREHENSIVE METABOLIC PANEL   Collection Time   02/26/11  1:50 AM      Component Value Range   Sodium 142  135 - 145 mEq/L   Potassium 5.2 (*) 3.5 - 5.1 mEq/L   Chloride 105  96 - 112 mEq/L   CO2 30  19 - 32 mEq/L   Glucose, Bld 95  70 - 99 mg/dL   BUN 15  6 - 23 mg/dL   Creatinine, Ser 0.45  0.50 - 1.10 mg/dL   Calcium 9.2  8.4 - 40.9 mg/dL   Total Protein 6.4  6.0 - 8.3 g/dL   Albumin 3.2 (*) 3.5 - 5.2 g/dL   AST 13  0 - 37 U/L   ALT 10  0 - 35 U/L   Alkaline Phosphatase 65  39 - 117 U/L   Total Bilirubin 0.2 (*) 0.3 - 1.2 mg/dL   GFR calc non Af Amer 57 (*) >90 mL/min   GFR calc Af Amer 66 (*) >90 mL/min  CARDIAC PANEL(CRET KIN+CKTOT+MB+TROPI)   Collection Time   02/26/11  9:02 AM      Component Value Range   Total CK 58  7  - 177 U/L   CK, MB 2.1  0.3 - 4.0 ng/mL   Troponin I 0.33 (*) <0.30 ng/mL   Relative Index RELATIVE INDEX IS INVALID  0.0 - 2.5  LIPID PANEL  Collection Time   02/26/11 10:00 AM      Component Value Range   Cholesterol 152  0 - 200 mg/dL   Triglycerides 161 (*) <150 mg/dL   HDL 47  >09 mg/dL   Total CHOL/HDL Ratio 3.2     VLDL 36  0 - 40 mg/dL   LDL Cholesterol 69  0 - 99 mg/dL  COMPREHENSIVE METABOLIC PANEL   Collection Time   12/14/11  3:14 PM      Component Value Range   Sodium 143  135 - 145 mEq/L   Potassium 4.7  3.5 - 5.3 mEq/L   Chloride 102  96 - 112 mEq/L   CO2 33 (*) 19 - 32 mEq/L   Glucose, Bld 88  70 - 99 mg/dL   BUN 16  6 - 23 mg/dL   Creat 6.04  5.40 - 9.81 mg/dL   Total Bilirubin 0.1 (*) 0.3 - 1.2 mg/dL   Alkaline Phosphatase 69  39 - 117 U/L   AST 14  0 - 37 U/L   ALT 11  0 - 35 U/L   Total Protein 7.1  6.0 - 8.3 g/dL   Albumin 3.8  3.5 - 5.2 g/dL   Calcium 9.5  8.4 - 19.1 mg/dL  CBC WITH DIFFERENTIAL   Collection Time   12/14/11  3:14 PM      Component Value Range   WBC 6.8  4.0 - 10.5 K/uL   RBC 4.92  3.87 - 5.11 MIL/uL   Hemoglobin 15.3 (*) 12.0 - 15.0 g/dL   HCT 47.8 (*) 29.5 - 62.1 %   MCV 93.7  78.0 - 100.0 fL   MCH 31.1  26.0 - 34.0 pg   MCHC 33.2  30.0 - 36.0 g/dL   RDW 30.8  65.7 - 84.6 %   Platelets 271  150 - 400 K/uL   Neutrophils Relative 52  43 - 77 %   Neutro Abs 3.5  1.7 - 7.7 K/uL   Lymphocytes Relative 37  12 - 46 %   Lymphs Abs 2.5  0.7 - 4.0 K/uL   Monocytes Relative 8  3 - 12 %   Monocytes Absolute 0.5  0.1 - 1.0 K/uL   Eosinophils Relative 3  0 - 5 %   Eosinophils Absolute 0.2  0.0 - 0.7 K/uL   Basophils Relative 1  0 - 1 %   Basophils Absolute 0.0  0.0 - 0.1 K/uL   Smear Review 0/24    LIPASE   Collection Time   12/14/11  3:14 PM      Component Value Range   Lipase 31  11 - 59 U/L  POCT URINALYSIS DIPSTICK   Collection Time   12/14/11  3:41 PM      Component Value Range   Color, UA yellow     Clarity, UA clear      Glucose, UA neg     Bilirubin, UA small     Ketones, UA trace     Spec Grav, UA 1.025     Blood, UA neg     pH, UA 6.5     Protein, UA neg     Urobilinogen, UA 0.2     Nitrite, UA neg     Leukocytes, UA Negative    COMPREHENSIVE METABOLIC PANEL   Collection Time   12/24/11 10:23 AM      Component Value Range   Sodium 138  135 - 145 mEq/L   Potassium 4.6  3.5 -  5.1 mEq/L   Chloride 102  96 - 112 mEq/L   CO2 28  19 - 32 mEq/L   Glucose, Bld 95  70 - 99 mg/dL   BUN 7  6 - 23 mg/dL   Creatinine, Ser 1.61  0.50 - 1.10 mg/dL   Calcium 9.3  8.4 - 09.6 mg/dL   Total Protein 7.4  6.0 - 8.3 g/dL   Albumin 3.8  3.5 - 5.2 g/dL   AST 18  0 - 37 U/L   ALT 10  0 - 35 U/L   Alkaline Phosphatase 66  39 - 117 U/L   Total Bilirubin 0.2 (*) 0.3 - 1.2 mg/dL   GFR calc non Af Amer 56 (*) >90 mL/min   GFR calc Af Amer 65 (*) >90 mL/min  LIPASE, BLOOD   Collection Time   12/24/11 10:23 AM      Component Value Range   Lipase 20  11 - 59 U/L  CBC WITH DIFFERENTIAL   Collection Time   12/24/11 10:23 AM      Component Value Range   WBC 6.0  4.0 - 10.5 K/uL   RBC 4.82  3.87 - 5.11 MIL/uL   Hemoglobin 15.0  12.0 - 15.0 g/dL   HCT 04.5  40.9 - 81.1 %   MCV 92.3  78.0 - 100.0 fL   MCH 31.1  26.0 - 34.0 pg   MCHC 33.7  30.0 - 36.0 g/dL   RDW 91.4  78.2 - 95.6 %   Platelets 262  150 - 400 K/uL   Neutrophils Relative 56  43 - 77 %   Neutro Abs 3.4  1.7 - 7.7 K/uL   Lymphocytes Relative 36  12 - 46 %   Lymphs Abs 2.2  0.7 - 4.0 K/uL   Monocytes Relative 6  3 - 12 %   Monocytes Absolute 0.3  0.1 - 1.0 K/uL   Eosinophils Relative 2  0 - 5 %   Eosinophils Absolute 0.1  0.0 - 0.7 K/uL   Basophils Relative 0  0 - 1 %   Basophils Absolute 0.0  0.0 - 0.1 K/uL  URINALYSIS, ROUTINE W REFLEX MICROSCOPIC   Collection Time   12/24/11 10:42 AM      Component Value Range   Color, Urine YELLOW  YELLOW   APPearance CLEAR  CLEAR   Specific Gravity, Urine <1.005 (*) 1.005 - 1.030   pH 6.0  5.0 - 8.0    Glucose, UA NEGATIVE  NEGATIVE mg/dL   Hgb urine dipstick NEGATIVE  NEGATIVE   Bilirubin Urine NEGATIVE  NEGATIVE   Ketones, ur NEGATIVE  NEGATIVE mg/dL   Protein, ur NEGATIVE  NEGATIVE mg/dL   Urobilinogen, UA 0.2  0.0 - 1.0 mg/dL   Nitrite NEGATIVE  NEGATIVE   Leukocytes, UA NEGATIVE  NEGATIVE  POCT URINALYSIS DIPSTICK   Collection Time   12/24/11  3:23 PM      Component Value Range   Color, UA yellow     Clarity, UA clear     Glucose, UA neg     Bilirubin, UA neg     Ketones, UA neg     Spec Grav, UA <=1.005     Blood, UA neg     pH, UA 6.0     Protein, UA neg     Urobilinogen, UA 0.2     Nitrite, UA neg     Leukocytes, UA Negative    URINALYSIS   Collection Time   01/17/12  11:26 AM      Component Value Range   Color, Urine YELLOW  YELLOW   APPearance CLEAR  CLEAR   Specific Gravity, Urine 1.020  1.005 - 1.030   pH 5.0  5.0 - 8.0   Glucose, UA NEG  NEG mg/dL   Bilirubin Urine NEG  NEG   Ketones, ur NEG  NEG mg/dL   Hgb urine dipstick NEG  NEG   Protein, ur NEG  NEG mg/dL   Urobilinogen, UA 0.2  0.0 - 1.0 mg/dL   Nitrite NEG  NEG   Leukocytes, UA NEG  NEG  Family doctor draws the blood work and nothing is out of range.  Diagnoses Axis I  Major depressive disorder, rule out bipolar disorder  Axis II deferred Axis III see medical history Axis IV moderate Axis V 55-60  Plan: I took her vitals.  I reviewed CC, tobacco/med/surg Hx, meds effects/ side effects, problem list, therapies and responses as well as current situation/symptoms discussed options. See orders and pt instructions for more details.  Medical Decision Making Problem Points:  Established problem, stable/improving (1), Review of last therapy session (1) and Review of psycho-social stressors (1) Data Points:  Review or order clinical lab tests (1) Review of medication regiment & side effects (2)  I certify that outpatient services furnished can reasonably be expected to improve the patient's  condition.   Orson Aloe, MD, [facility]

## 2012-02-11 ENCOUNTER — Other Ambulatory Visit: Payer: Self-pay | Admitting: Family Medicine

## 2012-02-12 ENCOUNTER — Ambulatory Visit: Payer: Medicare Other | Admitting: Family Medicine

## 2012-02-12 ENCOUNTER — Ambulatory Visit (HOSPITAL_COMMUNITY): Payer: Self-pay | Admitting: Psychiatry

## 2012-02-18 ENCOUNTER — Ambulatory Visit: Payer: Medicare Other | Admitting: Family Medicine

## 2012-02-18 NOTE — Telephone Encounter (Signed)
This encounter was created in error - please disregard.

## 2012-02-22 ENCOUNTER — Encounter: Payer: Self-pay | Admitting: Family Medicine

## 2012-02-22 ENCOUNTER — Ambulatory Visit (INDEPENDENT_AMBULATORY_CARE_PROVIDER_SITE_OTHER): Payer: Medicare Other | Admitting: Family Medicine

## 2012-02-22 VITALS — BP 114/78 | HR 91 | Resp 16 | Wt 195.0 lb

## 2012-02-22 DIAGNOSIS — J449 Chronic obstructive pulmonary disease, unspecified: Secondary | ICD-10-CM

## 2012-02-22 DIAGNOSIS — E785 Hyperlipidemia, unspecified: Secondary | ICD-10-CM

## 2012-02-22 NOTE — Progress Notes (Signed)
  Subjective:    Patient ID: Tonya Sullivan, female    DOB: Nov 24, 1955, 57 y.o.   MRN: [id number]  HPI  Patient states she was here for followup visit however it appears this was an area and she at actually been seen recently in the visit was not changed. In any case she is due to have her lipid panel checked in 3 weeks. Her breathing is much improved and she is now using Spiriva She is being followed by her psychiatrist regarding her medication changes she continues to have anxiety and I advised her she has to discuss this with her psychiatrist with her multiple medications they are treating her anxiety and depression Seen by dermatology for hand -still on antibotics Review of Systems - per above  GEN- denies fatigue, fever, weight loss,weakness, recent illness HEENT- denies eye drainage, change in vision, nasal discharge, CVS- denies chest pain, palpitations RESP- denies SOB, cough, wheeze Neuro- denies headache, dizziness, syncope, seizure activity       Objective:   Physical Exam  GEN- NAD, alert and oriented x3,  CVS- RRR, no murmur RESP-few bilat wheeze, normal WOB Pulses- Radial 2+      Assessment & Plan:

## 2012-02-22 NOTE — Patient Instructions (Addendum)
Get the cholesterol level checked 1st week of March   Continue current medications F/U 3 months

## 2012-02-24 NOTE — Assessment & Plan Note (Signed)
Recheck FLP , on statin therapy

## 2012-02-24 NOTE — Assessment & Plan Note (Signed)
Doing well on spiriva 

## 2012-02-26 ENCOUNTER — Ambulatory Visit (INDEPENDENT_AMBULATORY_CARE_PROVIDER_SITE_OTHER): Payer: 59 | Admitting: Psychiatry

## 2012-02-26 NOTE — Patient Instructions (Signed)
Discussed orally 
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
## 2012-03-04 ENCOUNTER — Ambulatory Visit (HOSPITAL_COMMUNITY): Payer: Self-pay | Admitting: Psychiatry

## 2012-03-11 ENCOUNTER — Ambulatory Visit (INDEPENDENT_AMBULATORY_CARE_PROVIDER_SITE_OTHER): Payer: Medicare Other | Admitting: Psychiatry

## 2012-03-11 DIAGNOSIS — F329 Major depressive disorder, single episode, unspecified: Secondary | ICD-10-CM

## 2012-03-11 NOTE — Progress Notes (Signed)
Patient:  Tonya Sullivan   DOB: [birth year]  MR Number: [id number]  Location: [facility]:  [address] [place] [place],  [place], [address]  Start: Tuesday 03/11/2012 1:00 PM End: Tuesday 03/11/2012 1:50 PM  Iva Posten/Observer:     Florencia Reasons, MSW, LCSW   Chief Complaint:      Chief Complaint  Patient presents with  . Anxiety  . Depression    Reason For Service:     The patient was referred for services by psychiatrist Dr. Lolly Mustache due to patient experiencing anxiety, stress, depression, and mood swings. She has a long-standing history of depression beginning in adolescence. Patient also has significant trauma history. Patient also experienced grief and loss issues due to to the death of her husband in 09-17-2010. Patient is seen today for follow up appointment.   Interventions Strategy:  Supportive therapy,  cognitive behavioral therapy  Participation Level:   Active  Participation Quality:  Appropriate      Behavioral Observation:  Well goomed, Alert, anxious   Current Psychosocial Factors: Patient reports discord with her mother.   Content of Session:   Reviewing symptoms, processing feelings and trauma history, identifying strengths from trauma history, reinforcing patient's efforts to set and maintain boundaries, reinforcing patient's efforts to improve self-care  Current Status:   Patient reports decreased anxiety and decreased.panic attacks. Patient also reports improved mood.  Patient Progress:    Good. The patient reports decreased anxiety and panic attacks since last session. He she continues to express frustration and anger regarding her mother and trauma history. Patient has been ignoring her mother's phone calls. She continues to express anger and  disappointment that mother has a pattern of not being supportive or protective of patient since childhood. She states really wanting to tell her mother how she feels but being unable to do so. Therapist and patient  explore the possibility of patient writing a letter to her mother expressing her feelings but keeping the letter instead of mailing it.  Therapist and patient also discuss strengths from patient's trauma history including patient developing problem-solving skills and being protective as well as setting priorities regarding her son's welfare when he was a child. She continues to maintain involvement with her son and his family. She expresses appropriate concern about her 81 year old granddaughter with whom the family has had no contact in many years. Patient reports increased motivation and interest in activities. She recently treated self to dinner at a restaurant. She also recently completed paperwork [facility] [employer]      Target Goals:   1. Improving mood as evidenced by resuming normal interest in activities and increased social involvement. 2. Increase self acceptance and improve self-esteem as evidenced by patient making positive statements about self. 3.  Improve coping and relaxation techniques. 4. Process and resolve grief and loss issues.   Last Reviewed:   04/06/2011  Goals Addressed Today:    Goals 1, 2,  and 3  Impression/Diagnosis:   The patient presents with a long-standing history of recurrent depression, mood swings, and a significant trauma history.  Diagnosis: Major Depressive Disorder  Diagnosis:  Axis I:  Major Depressive Disorder           Axis II: Deferred

## 2012-03-11 NOTE — Patient Instructions (Signed)
Discussed orally 

## 2012-03-25 ENCOUNTER — Ambulatory Visit (HOSPITAL_COMMUNITY): Payer: Self-pay | Admitting: Psychiatry

## 2012-04-01 ENCOUNTER — Ambulatory Visit (INDEPENDENT_AMBULATORY_CARE_PROVIDER_SITE_OTHER): Payer: Medicare Other | Admitting: Psychiatry

## 2012-04-01 DIAGNOSIS — F329 Major depressive disorder, single episode, unspecified: Secondary | ICD-10-CM

## 2012-04-01 NOTE — Patient Instructions (Signed)
Discussed orally 

## 2012-04-01 NOTE — Progress Notes (Signed)
Patient:  Tonya Sullivan   DOB: 08-28-1955  MR Number: [id number]  [facility]: [facility]:  [address] [place],  [place], [address]  Start: Tuesday 04/01/2012 1:05 PM End: Tuesday 04/01/2012 1:35 PM  Provider/Observer:     Florencia Reasons, MSW, LCSW   Chief Complaint:      Chief Complaint  Patient presents with  . Depression  . Anxiety    Reason For Service:     The patient was referred for services by psychiatrist Dr. Lolly Mustache due to patient experiencing anxiety, stress, depression, and mood swings. She has a long-standing history of depression beginning in adolescence. Patient also has significant trauma history. Patient also experienced grief and loss issues due to to the death of her husband in September 14, 2010. Patient is seen today for follow up appointment.   Interventions Strategy:  Supportive therapy,  cognitive behavioral therapy  Participation Level:   Active  Participation Quality:  Appropriate      Behavioral Observation:  Well goomed, Alert, pleasant  Current Psychosocial Factors:   Content of Session:   Reviewing symptoms, processing feelings,  reinforcing patient's efforts to improve self-care  Current Status:   Patient reports decreased anxiety and decreased.panic attacks. Patient also reports improved mood.  Patient Progress:    Good. The patient reports continued decreased anxiety and panic attacks since last session. She reports no longer feeling guilty about lack of contact  with her mother. She expresses acceptance that mother's choices have created mother's situation. Patient also states no longer feeling compelled to please her mother. She states being pleased with self and focusing on pleasing. God. She continues to miss her husband and expresses appropriate sadness. She plans to visit his grave for his birthday on May 5th. Patient has maintained involvement in activities and continues to socialize with her son and his family. She also has been  socializing with a neighbor. Patient reports being accepted for a while scholarship and states plans to begin water aerobics next week.       Target Goals:   1. Improving mood as evidenced by resuming normal interest in activities and increased social involvement. 2. Increase self acceptance and improve self-esteem as evidenced by patient making positive statements about self. 3.  Improve coping and relaxation techniques. 4. Process and resolve grief and loss issues.   Last Reviewed:   04/06/2011  Goals Addressed Today:    Goals 1, 2,  and 3  Impression/Diagnosis:   The patient presents with a long-standing history of recurrent depression, mood swings, and a significant trauma history.  Diagnosis: Major Depressive Disorder  Diagnosis:  Axis I:  Major Depressive Disorder           Axis II: Deferred

## 2012-04-04 ENCOUNTER — Encounter (HOSPITAL_COMMUNITY): Payer: Self-pay | Admitting: Psychiatry

## 2012-04-04 ENCOUNTER — Ambulatory Visit (INDEPENDENT_AMBULATORY_CARE_PROVIDER_SITE_OTHER): Payer: Medicare Other | Admitting: Psychiatry

## 2012-04-04 VITALS — Wt 197.0 lb

## 2012-04-04 DIAGNOSIS — R52 Pain, unspecified: Secondary | ICD-10-CM

## 2012-04-04 DIAGNOSIS — F319 Bipolar disorder, unspecified: Secondary | ICD-10-CM

## 2012-04-04 DIAGNOSIS — F419 Anxiety disorder, unspecified: Secondary | ICD-10-CM

## 2012-04-04 DIAGNOSIS — G8929 Other chronic pain: Secondary | ICD-10-CM

## 2012-04-04 DIAGNOSIS — F329 Major depressive disorder, single episode, unspecified: Secondary | ICD-10-CM

## 2012-04-04 DIAGNOSIS — Z720 Tobacco use: Secondary | ICD-10-CM

## 2012-04-04 DIAGNOSIS — M549 Dorsalgia, unspecified: Secondary | ICD-10-CM

## 2012-04-04 MED ORDER — PROPRANOLOL HCL 10 MG PO TABS: 10.0000 mg | ORAL_TABLET | Freq: Three times a day (TID) | ORAL | Status: DC

## 2012-04-04 MED ORDER — DULOXETINE HCL 30 MG PO CPEP: ORAL_CAPSULE | ORAL | Status: DC

## 2012-04-04 MED ORDER — ZIPRASIDONE HCL 80 MG PO CAPS: 80.0000 mg | ORAL_CAPSULE | Freq: Two times a day (BID) | ORAL | Status: DC

## 2012-04-04 MED ORDER — BUSPIRONE HCL 10 MG PO TABS: 10.0000 mg | ORAL_TABLET | Freq: Four times a day (QID) | ORAL | Status: DC

## 2012-04-04 NOTE — Patient Instructions (Addendum)
Practice the relaxation and breathing exercises several times a day to help lower the background anxiety that you carry around.  Relaxation is the ultimate solution for you.  You can seek it through tub baths, bubble baths, essential oils or incense, walking or chatting with friends, listening to soft music, watching a candle burn and just letting all thoughts go and appreciating the true essence of the Creator.  Pets or animals may be very helpful.  You might spend some time with them and then go do more directed meditation.  "I am Wishes Fulfilled Meditation" by Marylene Buerger and Lyndal Pulley may be helpful MUSIC for getting to sleep or for meditating You can order it from on line.  You might find the chill channel on Pandora and explore the artists that you like better.   Take care of yourself.  No one else is standing up to do the job and only you know what you need.   GET SERIOUS about taking care of yourself.  Do the next right thing and that often means doing something to care for yourself along the lines of are you hungry, are you angry, are you lonely, are you tired, are you scared?  HALTS is what that stands for.  Call if problems or concerns.

## 2012-04-04 NOTE — Progress Notes (Signed)
[facility] [address] Progress Note Tonya Sullivan MRN: [id number] DOB: 03-May-1955 Age: 57 y.o.  Date: 04/04/2012 Start Time: 3:35 PM End Time: 3:55 PM  Chief Complaint: Chief Complaint  Patient presents with  . Depression  . Follow-up  . Medication Refill   Subjective: "I've got to have Xanax or Klonopin for my anxiety attacks.  The Lidoderm patches really help". Depression 5/10 and Anxiety 3/10, where 1 is the best and 10 is the worst. Pain is 0/10.  Pt returns of appointment.  Pt reports that she is compliant with the psychotropic medications with good benefit and no noticeable side effects.  She is noting that her anxiety attacks are really what is driving her nuts.  Discussed that her whole family were alcoholics and that there is no difference between drinking alcohol and taking klonopin.    Current psychiatric medication Cymbalta 30 mg twice a day Geodon 80 mg twice a day Lidoderm q 12 hours  Mobic 7.5 mg once a day  Past psychiatric history Patient endorse history of depression since age 31.  She has been at least 2 psychiatric admission due to suicidal thoughts.  She denies any history of suicidal attempt however endorse endorse history of paranoia and severe depression.  Her last psychiatric admission was January 2012 due to significant depression.  In the past she had tried gabapentin, Risperdal, trazodone, Abilify , Effexor, Valium, Lamictal, Klonopin and recently Wellbutrin.    Medical history She has history of history of chronic back pain, hyperlipidemia, coronary artery calcification and chest pain.  Family History: family history includes ADD / ADHD in her brothers and sisters; Alcohol abuse in her brothers, father, maternal grandfather, maternal grandmother, paternal grandfather, paternal grandmother, and sisters; Anxiety disorder in her maternal grandmother, mother, and sisters; Bipolar disorder in her brothers, maternal aunt, maternal grandfather, maternal  grandmother, maternal uncles, paternal aunt, paternal grandfather, paternal grandmother, paternal uncle, and sisters; Cancer in her father; Dementia in her maternal grandmother; Drug abuse in her brothers, paternal grandmother, and sister; Heart attack in her father and mother; Heart disease in her mother; Hypertension in her brother and mother; OCD in her brother; Paranoid behavior in her brother; Physical abuse in her brother and sisters; and Sexual abuse in her brother and sisters.  There is no history of Schizophrenia and Seizures.  Mental status examination Patient is casually dressed and fairly groomed. She appears calm cooperative and relevant in conversation.  She maintained good eye contact.  Her speech is soft clear and coherent.  Her thought processes logical goal-directed.  She described her mood is good and her affect is improved from the past.  She denies any auditory or visual hallucination.  She denies any active or passive suicidal thoughts or homicidal thoughts.  There is some paranoia but there were no delusion present.  Her attention and concentration is better.  She's alert and oriented x3.  Her insight judgment and impulse control is okay.  Lab Results:  Results for orders placed in visit on 01/17/12 (from the past 8736 hour(s))  URINALYSIS   Collection Time    01/17/12 11:26 AM      Result Value Range   Color, Urine YELLOW  YELLOW   APPearance CLEAR  CLEAR   Specific Gravity, Urine 1.020  1.005 - 1.030   pH 5.0  5.0 - 8.0   Glucose, UA NEG  NEG mg/dL   Bilirubin Urine NEG  NEG   Ketones, ur NEG  NEG mg/dL   Hgb urine  dipstick NEG  NEG   Protein, ur NEG  NEG mg/dL   Urobilinogen, UA 0.2  0.0 - 1.0 mg/dL   Nitrite NEG  NEG   Leukocytes, UA NEG  NEG  Results for orders placed during the hospital encounter of 12/24/11 (from the past 8736 hour(s))  COMPREHENSIVE METABOLIC PANEL   Collection Time    12/24/11 10:23 AM      Result Value Range   Sodium 138  135 - 145 mEq/L    Potassium 4.6  3.5 - 5.1 mEq/L   Chloride 102  96 - 112 mEq/L   CO2 28  19 - 32 mEq/L   Glucose, Bld 95  70 - 99 mg/dL   BUN 7  6 - 23 mg/dL   Creatinine, Ser 1.61  0.50 - 1.10 mg/dL   Calcium 9.3  8.4 - 09.6 mg/dL   Total Protein 7.4  6.0 - 8.3 g/dL   Albumin 3.8  3.5 - 5.2 g/dL   AST 18  0 - 37 U/L   ALT 10  0 - 35 U/L   Alkaline Phosphatase 66  39 - 117 U/L   Total Bilirubin 0.2 (*) 0.3 - 1.2 mg/dL   GFR calc non Af Amer 56 (*) >90 mL/min   GFR calc Af Amer 65 (*) >90 mL/min  LIPASE, BLOOD   Collection Time    12/24/11 10:23 AM      Result Value Range   Lipase 20  11 - 59 U/L  CBC WITH DIFFERENTIAL   Collection Time    12/24/11 10:23 AM      Result Value Range   WBC 6.0  4.0 - 10.5 K/uL   RBC 4.82  3.87 - 5.11 MIL/uL   Hemoglobin 15.0  12.0 - 15.0 g/dL   HCT 04.5  40.9 - 81.1 %   MCV 92.3  78.0 - 100.0 fL   MCH 31.1  26.0 - 34.0 pg   MCHC 33.7  30.0 - 36.0 g/dL   RDW 91.4  78.2 - 95.6 %   Platelets 262  150 - 400 K/uL   Neutrophils Relative 56  43 - 77 %   Neutro Abs 3.4  1.7 - 7.7 K/uL   Lymphocytes Relative 36  12 - 46 %   Lymphs Abs 2.2  0.7 - 4.0 K/uL   Monocytes Relative 6  3 - 12 %   Monocytes Absolute 0.3  0.1 - 1.0 K/uL   Eosinophils Relative 2  0 - 5 %   Eosinophils Absolute 0.1  0.0 - 0.7 K/uL   Basophils Relative 0  0 - 1 %   Basophils Absolute 0.0  0.0 - 0.1 K/uL  URINALYSIS, ROUTINE W REFLEX MICROSCOPIC   Collection Time    12/24/11 10:42 AM      Result Value Range   Color, Urine YELLOW  YELLOW   APPearance CLEAR  CLEAR   Specific Gravity, Urine <1.005 (*) 1.005 - 1.030   pH 6.0  5.0 - 8.0   Glucose, UA NEGATIVE  NEGATIVE mg/dL   Hgb urine dipstick NEGATIVE  NEGATIVE   Bilirubin Urine NEGATIVE  NEGATIVE   Ketones, ur NEGATIVE  NEGATIVE mg/dL   Protein, ur NEGATIVE  NEGATIVE mg/dL   Urobilinogen, UA 0.2  0.0 - 1.0 mg/dL   Nitrite NEGATIVE  NEGATIVE   Leukocytes, UA NEGATIVE  NEGATIVE  POCT URINALYSIS DIPSTICK   Collection Time    12/24/11   3:23 PM      Result Value Range  Color, UA yellow     Clarity, UA clear     Glucose, UA neg     Bilirubin, UA neg     Ketones, UA neg     Spec Grav, UA <=1.005     Blood, UA neg     pH, UA 6.0     Protein, UA neg     Urobilinogen, UA 0.2     Nitrite, UA neg     Leukocytes, UA Negative    Results for orders placed in visit on 12/14/11 (from the past 8736 hour(s))  COMPREHENSIVE METABOLIC PANEL   Collection Time    12/14/11  3:14 PM      Result Value Range   Sodium 143  135 - 145 mEq/L   Potassium 4.7  3.5 - 5.3 mEq/L   Chloride 102  96 - 112 mEq/L   CO2 33 (*) 19 - 32 mEq/L   Glucose, Bld 88  70 - 99 mg/dL   BUN 16  6 - 23 mg/dL   Creat 1.61  0.96 - 0.45 mg/dL   Total Bilirubin 0.1 (*) 0.3 - 1.2 mg/dL   Alkaline Phosphatase 69  39 - 117 U/L   AST 14  0 - 37 U/L   ALT 11  0 - 35 U/L   Total Protein 7.1  6.0 - 8.3 g/dL   Albumin 3.8  3.5 - 5.2 g/dL   Calcium 9.5  8.4 - 40.9 mg/dL  CBC WITH DIFFERENTIAL   Collection Time    12/14/11  3:14 PM      Result Value Range   WBC 6.8  4.0 - 10.5 K/uL   RBC 4.92  3.87 - 5.11 MIL/uL   Hemoglobin 15.3 (*) 12.0 - 15.0 g/dL   HCT 81.1 (*) 91.4 - 78.2 %   MCV 93.7  78.0 - 100.0 fL   MCH 31.1  26.0 - 34.0 pg   MCHC 33.2  30.0 - 36.0 g/dL   RDW 95.6  21.3 - 08.6 %   Platelets 271  150 - 400 K/uL   Neutrophils Relative 52  43 - 77 %   Neutro Abs 3.5  1.7 - 7.7 K/uL   Lymphocytes Relative 37  12 - 46 %   Lymphs Abs 2.5  0.7 - 4.0 K/uL   Monocytes Relative 8  3 - 12 %   Monocytes Absolute 0.5  0.1 - 1.0 K/uL   Eosinophils Relative 3  0 - 5 %   Eosinophils Absolute 0.2  0.0 - 0.7 K/uL   Basophils Relative 1  0 - 1 %   Basophils Absolute 0.0  0.0 - 0.1 K/uL   Smear Review 0/24    LIPASE   Collection Time    12/14/11  3:14 PM      Result Value Range   Lipase 31  11 - 59 U/L  POCT URINALYSIS DIPSTICK   Collection Time    12/14/11  3:41 PM      Result Value Range   Color, UA yellow     Clarity, UA clear     Glucose, UA neg      Bilirubin, UA small     Ketones, UA trace     Spec Grav, UA 1.025     Blood, UA neg     pH, UA 6.5     Protein, UA neg     Urobilinogen, UA 0.2     Nitrite, UA neg     Leukocytes, UA Negative  PCP draws routine labs and nothing is emerging as of concern.  Diagnoses Axis I  Major depressive disorder, rule out bipolar disorder  Axis II deferred Axis III see medical history Axis IV moderate Axis V 55-60  Plan: I took her vitals.  I reviewed CC, tobacco/med/surg Hx, meds effects/ side effects, problem list, therapies and responses as well as current situation/symptoms discussed options. See orders and pt instructions for more details. Consider starting Tegretol between this and the next appointment if her anxeity is no better with the addition of Inderal and BuSpar.   MEDICATIONS this encounter: Meds ordered this encounter  Medications  . DULoxetine (CYMBALTA) 30 MG capsule    Sig: Take by mouth three a day.    Dispense:  90 capsule    Refill:  1  . busPIRone (BUSPAR) 10 MG tablet    Sig: Take 1 tablet (10 mg total) by mouth 4 (four) times daily.    Dispense:  120 tablet    Refill:  1  . propranolol (INDERAL) 10 MG tablet    Sig: Take 1 tablet (10 mg total) by mouth 3 (three) times daily.    Dispense:  90 tablet    Refill:  1  . ziprasidone (GEODON) 80 MG capsule    Sig: Take 1 capsule (80 mg total) by mouth 2 (two) times daily with a meal.    Dispense:  120 capsule    Refill:  2    Medical Decision Making Problem Points:  Established problem, stable/improving (1), Review of last therapy session (1) and Review of psycho-social stressors (1) Data Points:  Review or order clinical lab tests (1) Review of medication regiment & side effects (2)  I certify that outpatient services furnished can reasonably be expected to improve the patient's condition.   Orson Aloe, MD, [facility]

## 2012-04-28 ENCOUNTER — Telehealth (HOSPITAL_COMMUNITY): Payer: Self-pay | Admitting: Psychiatry

## 2012-04-28 ENCOUNTER — Other Ambulatory Visit: Payer: Self-pay | Admitting: Family Medicine

## 2012-04-28 MED ORDER — AMITRIPTYLINE HCL 100 MG PO TABS: 100.0000 mg | ORAL_TABLET | Freq: Every day | ORAL | Status: DC

## 2012-04-28 NOTE — Telephone Encounter (Signed)
So ordered by escript

## 2012-04-29 ENCOUNTER — Ambulatory Visit (HOSPITAL_COMMUNITY): Payer: Self-pay | Admitting: Psychiatry

## 2012-05-01 ENCOUNTER — Other Ambulatory Visit: Payer: Self-pay | Admitting: Family Medicine

## 2012-05-01 ENCOUNTER — Ambulatory Visit (INDEPENDENT_AMBULATORY_CARE_PROVIDER_SITE_OTHER): Payer: Medicare Other | Admitting: Family Medicine

## 2012-05-01 ENCOUNTER — Encounter: Payer: Self-pay | Admitting: Family Medicine

## 2012-05-01 VITALS — BP 132/76 | HR 91 | Resp 16 | Wt 200.4 lb

## 2012-05-01 DIAGNOSIS — E569 Vitamin deficiency, unspecified: Secondary | ICD-10-CM

## 2012-05-01 DIAGNOSIS — IMO0001 Reserved for inherently not codable concepts without codable children: Secondary | ICD-10-CM

## 2012-05-01 DIAGNOSIS — I10 Essential (primary) hypertension: Secondary | ICD-10-CM | POA: Insufficient documentation

## 2012-05-01 DIAGNOSIS — J449 Chronic obstructive pulmonary disease, unspecified: Secondary | ICD-10-CM

## 2012-05-01 DIAGNOSIS — E785 Hyperlipidemia, unspecified: Secondary | ICD-10-CM

## 2012-05-01 DIAGNOSIS — R030 Elevated blood-pressure reading, without diagnosis of hypertension: Secondary | ICD-10-CM

## 2012-05-01 DIAGNOSIS — D229 Melanocytic nevi, unspecified: Secondary | ICD-10-CM | POA: Insufficient documentation

## 2012-05-01 DIAGNOSIS — Z79899 Other long term (current) drug therapy: Secondary | ICD-10-CM

## 2012-05-01 DIAGNOSIS — D239 Other benign neoplasm of skin, unspecified: Secondary | ICD-10-CM

## 2012-05-01 DIAGNOSIS — M6281 Muscle weakness (generalized): Secondary | ICD-10-CM

## 2012-05-01 LAB — VITAMIN B12: Vitamin B-12: 335 pg/mL (ref 211–911)

## 2012-05-01 LAB — CBC
HCT: 40 % (ref 36.0–46.0)
Hemoglobin: 13.6 g/dL (ref 12.0–15.0)
MCH: 30.1 pg (ref 26.0–34.0)
MCHC: 34 g/dL (ref 30.0–36.0)
RBC: 4.52 MIL/uL (ref 3.87–5.11)

## 2012-05-01 LAB — COMPREHENSIVE METABOLIC PANEL
Albumin: 3.8 g/dL (ref 3.5–5.2)
Alkaline Phosphatase: 55 U/L (ref 39–117)
BUN: 20 mg/dL (ref 6–23)
CO2: 25 mEq/L (ref 19–32)
Glucose, Bld: 93 mg/dL (ref 70–99)
Potassium: 4.2 mEq/L (ref 3.5–5.3)
Sodium: 140 mEq/L (ref 135–145)
Total Protein: 6.3 g/dL (ref 6.0–8.3)

## 2012-05-01 LAB — LIPID PANEL
Cholesterol: 134 mg/dL (ref 0–200)
LDL Cholesterol: 60 mg/dL (ref 0–99)
Triglycerides: 79 mg/dL (ref <150)
VLDL: 16 mg/dL (ref 0–40)

## 2012-05-01 LAB — CK: Total CK: 44 U/L (ref 7–177)

## 2012-05-01 LAB — TSH: TSH: 1.066 u[IU]/mL (ref 0.350–4.500)

## 2012-05-01 MED ORDER — ALBUTEROL SULFATE (2.5 MG/3ML) 0.083% IN NEBU: 2.5000 mg | INHALATION_SOLUTION | RESPIRATORY_TRACT | Status: DC | PRN

## 2012-05-01 NOTE — Assessment & Plan Note (Signed)
She has had some elevated BP during her procedures, BP in my office have been normal, will have her check at home periodically May have had some anxiety prior to procedures

## 2012-05-01 NOTE — Patient Instructions (Addendum)
Get the labs done today Nebulizer sent for albuterol Continue spiriva YOu need to work on quitting smoking! Your vitamins levels will be check Call Dr. Margo Aye to get a recheck on your hand F/U 3 months [employer]

## 2012-05-01 NOTE — Assessment & Plan Note (Signed)
Benign appearing, she has f/u derm

## 2012-05-01 NOTE — Progress Notes (Signed)
  Subjective:    Patient ID: Tonya Sullivan, female    DOB: 1955-08-20, 57 y.o.   MRN: [id number]  HPI Pt here with multiple concerns, may of which very vague. Gets weak all over, flushed at times "when I am hot". Told she has myasthenia gravis  in the past  No meds given, no special testing states eye doctor thought she had it >15 years ago, states all her muscles are weak and she hurts all over. She has been looking up myasthenia. Her back pain is not well controlled. She is still depressed despite her medications. Worried about mole on back she scratched off. Her blood pressure has been high at neurologist office Her breathing acts up and albuterol does not help, wants nebulizer this helped better in past   Review of Systems  GEN- + fatigue, fever, weight loss,+weakness, recent illness HEENT- denies eye drainage, change in vision, nasal discharge, CVS- denies chest pain, palpitations RESP- denies SOB, cough, wheeze ABD- denies N/V, change in stools, abd pain GU- denies dysuria, hematuria, dribbling, incontinence MSK- + joint pain, muscle aches, injury Neuro- denies headache, dizziness, syncope, seizure activity      Objective:   Physical Exam GEN- NAD, alert and oriented x3 HEENT- PERRL, EOMI, non injected sclera, pink conjunctiva, MMM, oropharynx clear, no ptosis Neck- Supple,  CVS- RRR, no murmur RESP-CTAB EXT- No edema Pulses- Radial, DP- 2+ Skin- flesh colored mole on upper left back,excoriation to side of mole Neuro- CNII-XII in tact, normal tone Upper and lower ext       Assessment & Plan:

## 2012-05-01 NOTE — Assessment & Plan Note (Signed)
Fasting labs today

## 2012-05-01 NOTE — Assessment & Plan Note (Addendum)
She has decreased ROM due to chronic neck and back pain, no tone difference ? This history of myasthenia gravis with some eye symptoms, I do not think her exam fits with myasthenia, she was never actually treated for this, I see this is as more of chronic pain, OA, bipolar, fibromyalgia. I find it interesting she never offered this information before. Pt asked for levels to be checked, Check B12, Vit D, CK Advised her to discuss with neurology who is also her pain medicine provider

## 2012-05-01 NOTE — Assessment & Plan Note (Signed)
Has been on advair in past did not do well with inhaled corticosteroids Continue spiriva, needs to quit smoking Nebulizer machine given

## 2012-05-02 ENCOUNTER — Encounter (HOSPITAL_COMMUNITY): Payer: Self-pay | Admitting: Psychiatry

## 2012-05-02 ENCOUNTER — Ambulatory Visit (INDEPENDENT_AMBULATORY_CARE_PROVIDER_SITE_OTHER): Payer: Medicare Other | Admitting: Psychiatry

## 2012-05-02 VITALS — BP 136/84 | Ht 66.0 in | Wt 200.0 lb

## 2012-05-02 DIAGNOSIS — F419 Anxiety disorder, unspecified: Secondary | ICD-10-CM

## 2012-05-02 DIAGNOSIS — F319 Bipolar disorder, unspecified: Secondary | ICD-10-CM

## 2012-05-02 DIAGNOSIS — F329 Major depressive disorder, single episode, unspecified: Secondary | ICD-10-CM

## 2012-05-02 DIAGNOSIS — G8929 Other chronic pain: Secondary | ICD-10-CM

## 2012-05-02 DIAGNOSIS — R52 Pain, unspecified: Secondary | ICD-10-CM

## 2012-05-02 MED ORDER — PROPRANOLOL HCL 10 MG PO TABS: 10.0000 mg | ORAL_TABLET | Freq: Three times a day (TID) | ORAL | Status: DC

## 2012-05-02 MED ORDER — LIDOCAINE 5 % EX PTCH: 1.0000 | MEDICATED_PATCH | Freq: Two times a day (BID) | CUTANEOUS | Status: DC

## 2012-05-02 MED ORDER — BUSPIRONE HCL 15 MG PO TABS: 15.0000 mg | ORAL_TABLET | Freq: Four times a day (QID) | ORAL | Status: DC

## 2012-05-02 MED ORDER — ZIPRASIDONE HCL 80 MG PO CAPS: 80.0000 mg | ORAL_CAPSULE | Freq: Two times a day (BID) | ORAL | Status: DC

## 2012-05-02 NOTE — Patient Instructions (Signed)
Set a timer for 8 or a certain number minutes and walk for that amount of time in the house or in the yard.  Mark the number of minutes on a calendar for that day.  Do that every day this week.  Then next week increase the time by 1 minutes and then mark the calendar with the number of minutes for that day.  Each week increase your exercise by one minute.  Keep a record of this so you can see the progress you are making.  Do this every day, just like eating and sleeping.  It is good for pain control, depression, and for your soul/spirit.  Bring the record in for your next visit so we can talk about your effort and how you feel with the new exercise program going and working for you.  Relaxation is the ultimate solution for you.  You can seek it through tub baths, bubble baths, essential oils or incense, walking or chatting with friends, listening to soft music, watching a candle burn and just letting all thoughts go and appreciating the true essence of the Creator.  Pets or animals may be very helpful.  You might spend some time with them and then go do more directed meditation.  Take care of yourself.  No one else is standing up to do the job and only you know what you need.   GET SERIOUS about taking care of yourself.  Do the next right thing and that often means doing something to care for yourself along the lines of are you hungry, are you angry, are you lonely, are you tired, are you scared?  HALTS is what that stands for.  Call if problems or concerns. 

## 2012-05-02 NOTE — Progress Notes (Signed)
[facility] [id number] Progress Note Tonya Sullivan MRN: [id number] DOB: Jul 30, 1955 Age: 57 y.o.  Date: 05/02/2012 Start Time: 2:30 PM End Time: 2:45 PM  Chief Complaint: Chief Complaint  Patient presents with  . Depression  . Follow-up  . Medication Refill   Subjective: "I need more of the amitriptyline". Depression 5/10 and Anxiety 10/10, where 1 is the best and 10 is the worst. Pain is 7/10 lower and middle of the back  Pt returns of appointment.  Pt reports that she is compliant with the psychotropic medications with fair benefit and no noticeable side effects.  She asks if she can overheat.  The amitriptyline may do that.  Am not sure about increasing that with this complaint. Will increase BuSpar for the anxiety.  LIFE STYLE CHANGES: [facility], has found that she can't do yoga per neurologist.  Current psychiatric medication Cymbalta 30 mg twice a day Geodon 80 mg twice a day BuSpar 10 mg four times a day Lidoderm q 12 hours  Mobic 7.5 mg once a day by Dr Judithann Sheen Current Outpatient Prescriptions  Medication Sig Dispense Refill  . amitriptyline (ELAVIL) 100 MG tablet Take 1 tablet (100 mg total) by mouth at bedtime.  30 tablet  0  . busPIRone (BUSPAR) 15 MG tablet Take 1 tablet (15 mg total) by mouth 4 (four) times daily.  120 tablet  1  . DULoxetine (CYMBALTA) 30 MG capsule Take by mouth three a day.  90 capsule  1  . lidocaine (LIDODERM) 5 % Place 1 patch onto the skin every 12 (twelve) hours. Remove & Discard patch within 12 hours or as directed by MD  30 patch  2  . meloxicam (MOBIC) 7.5 MG tablet Take 7.5 mg by mouth 2 (two) times daily.      . propranolol (INDERAL) 10 MG tablet Take 1 tablet (10 mg total) by mouth 3 (three) times daily.  90 tablet  1  . ziprasidone (GEODON) 80 MG capsule Take 1 capsule (80 mg total) by mouth 2 (two) times daily with a meal.  120 capsule  2  . albuterol (PROVENTIL) (2.5 MG/3ML) 0.083% nebulizer solution Take 3 mLs (2.5 mg total)  by nebulization every 4 (four) hours as needed for wheezing.  75 mL  3  . cyanocobalamin 500 MCG tablet Take 500 mcg by mouth daily.      . fish oil-omega-3 fatty acids 1000 MG capsule Take 1 g by mouth daily.      . hyoscyamine (LEVSIN/SL) 0.125 MG SL tablet Place 1 tablet (0.125 mg total) under the tongue every 6 (six) hours as needed for cramping.  60 tablet  1  . Multiple Vitamin (MULTIVITAMIN WITH MINERALS) TABS Take 1 tablet by mouth daily.      Marland Kitchen oxyCODONE-acetaminophen (PERCOCET) 10-325 MG per tablet Take 1 tablet by mouth every 6 (six) hours as needed. For pain      . pravastatin (PRAVACHOL) 40 MG tablet Take 40 mg by mouth at bedtime.      . pravastatin (PRAVACHOL) 40 MG tablet TAKE 1 TABLET BY MOUTH EVERY NIGHT AT BEDTIME  90 tablet  0  . PROVENTIL HFA 108 (90 BASE) MCG/ACT inhaler INHALE 2 PUFFS BY MOUTH EVERY 4 HOURS AS NEEDED FOR SHORTNESS OF BREATH  6.7 g  2  . tiotropium (SPIRIVA HANDIHALER) 18 MCG inhalation capsule Place 1 capsule (18 mcg total) into inhaler and inhale daily.  30 capsule  6  . [DISCONTINUED] Gabapentin, PHN, (GRALISE STARTER) 300 &  600 MG MISC Take 300-1,800 mg by mouth as directed. 15 DAY Starter pack doses     DAY 1- 300 mg     DAY 2- 600 mg     DAY 3 to 6 - 900 mg    DAY 7 to 10 - 1200 mg     DAY 11 to 14 - 1500 mg      DAY 15 - 1800 mg        No current facility-administered medications for this visit.    Past psychiatric history Patient endorse history of depression since age 82.  She has been at least 2 psychiatric admission due to suicidal thoughts.  She denies any history of suicidal attempt however endorse endorse history of paranoia and severe depression.  Her last psychiatric admission was January 2012 due to significant depression.  In the past she had tried gabapentin, Risperdal, trazodone, Abilify , Effexor, Valium, Lamictal, Klonopin and recently Wellbutrin.    Allergies: Allergies  Allergen Reactions  . Codeine Anaphylaxis  . Cyclobenzaprine  Anaphylaxis  . Darvocet (Propoxyphene-Acetaminophen) Anaphylaxis  . Abilify (Aripiprazole) Other (See Comments)    tremors  . Adhesive (Tape) Hives  . Dilaudid (Hydromorphone Hcl) Nausea And Vomiting    Only the pills.   . Iodine Hives  . Levofloxacin Hives  . Prednisone Hives  . Sulfa Antibiotics Itching   Medical History: Past Medical History  Diagnosis Date  . COPD (chronic obstructive pulmonary disease)   . Depression   . Chronic back pain     DDD, disc bulge, radiculopathy, spinal stenosis  . Hyperlipidemia   . Anxiety   . Bipolar disorder, unspecified   . Carotid artery calcification   . Melanoma     removed 1 week ago at Dr Laurena Slimmer office  . Obsessive-compulsive disorder   . DDD (degenerative disc disease), lumbosacral   . Spinal stenosis, lumbar    Surgical History: Past Surgical History  Procedure Laterality Date  . Appendectomy    . Tonsillectomy    . Cholecystectomy    . Lump left breast      removed- benign  . Abdominal hysterectomy      tubal pregnancy  . Inguinal hernia repair    . Stomach surgery      ?holes in esophgous  . Ectopic pregnancy surgery    . Back surgery      x 5;1984;1989;;1999;2000;2010  . Colonoscopy  01/25/2012    Procedure: COLONOSCOPY;  Surgeon: Malissa Hippo, MD;  Location: [facility];  Service: Endoscopy;  Laterality: N/A;  130   Family History: family history includes ADD / ADHD in her brothers and sisters; Alcohol abuse in her brothers, father, maternal grandfather, maternal grandmother, paternal grandfather, paternal grandmother, and sisters; Anxiety disorder in her maternal grandmother, mother, and sisters; Bipolar disorder in her brothers, maternal aunt, maternal grandfather, maternal grandmother, maternal uncles, paternal aunt, paternal grandfather, paternal grandmother, paternal uncle, and sisters; Cancer in her father; Dementia in her maternal grandmother; Drug abuse in her brothers, paternal grandmother, and sister; Heart  attack in her father and mother; Heart disease in her mother; Hypertension in her brother and mother; OCD in her brother; Paranoid behavior in her brother; Physical abuse in her brother and sisters; and Sexual abuse in her brother and sisters.  There is no history of Schizophrenia and Seizures.  Mental status examination Patient is casually dressed and fairly groomed. She appears calm cooperative and relevant in conversation.  She maintained good eye contact.  Her speech is  soft clear and coherent.  Her thought processes logical goal-directed.  She described her mood is good and her affect is improved from the past.  She denies any auditory or visual hallucination.  She denies any active or passive suicidal thoughts or homicidal thoughts.  There is some paranoia but there were no delusion present.  Her attention and concentration is better.  She's alert and oriented x3.  Her insight judgment and impulse control is okay.  Lab Results:  Results for orders placed in visit on 05/01/12 (from the past 8736 hour(s))  VITAMIN B12   Collection Time    05/01/12 11:58 AM      Result Value Range   Vitamin B-12 335  211 - 911 pg/mL  VITAMIN D 1,25 DIHYDROXY   Collection Time    05/01/12 11:58 AM      Result Value Range   Vitamin D 1, 25 (OH) Total       Vitamin D3 1, 25 (OH)       Vitamin D2 1, 25 (OH)      LIPID PANEL   Collection Time    05/01/12 11:55 AM      Result Value Range   Cholesterol 134  0 - 200 mg/dL   Triglycerides 79  <161 mg/dL   HDL 58  >09 mg/dL   Total CHOL/HDL Ratio 2.3     VLDL 16  0 - 40 mg/dL   LDL Cholesterol 60  0 - 99 mg/dL  CBC   Collection Time    05/01/12 11:55 AM      Result Value Range   WBC 6.2  4.0 - 10.5 K/uL   RBC 4.52  3.87 - 5.11 MIL/uL   Hemoglobin 13.6  12.0 - 15.0 g/dL   HCT 60.4  54.0 - 98.1 %   MCV 88.5  78.0 - 100.0 fL   MCH 30.1  26.0 - 34.0 pg   MCHC 34.0  30.0 - 36.0 g/dL   RDW 19.1  47.8 - 29.5 %   Platelets 292  150 - 400 K/uL   COMPREHENSIVE METABOLIC PANEL   Collection Time    05/01/12 11:55 AM      Result Value Range   Sodium 140  135 - 145 mEq/L   Potassium 4.2  3.5 - 5.3 mEq/L   Chloride 105  96 - 112 mEq/L   CO2 25  19 - 32 mEq/L   Glucose, Bld 93  70 - 99 mg/dL   BUN 20  6 - 23 mg/dL   Creat 6.21  3.08 - 6.57 mg/dL   Total Bilirubin 0.2 (*) 0.3 - 1.2 mg/dL   Alkaline Phosphatase 55  39 - 117 U/L   AST 10  0 - 37 U/L   ALT 12  0 - 35 U/L   Total Protein 6.3  6.0 - 8.3 g/dL   Albumin 3.8  3.5 - 5.2 g/dL   Calcium 9.1  8.4 - 84.6 mg/dL  CK   Collection Time    05/01/12 11:55 AM      Result Value Range   Total CK 44  7 - 177 U/L  TSH   Collection Time    05/01/12 11:55 AM      Result Value Range   TSH 1.066  0.350 - 4.500 uIU/mL  Results for orders placed in visit on 01/17/12 (from the past 8736 hour(s))  URINALYSIS   Collection Time    01/17/12 11:26 AM      Result Value  Range   Color, Urine YELLOW  YELLOW   APPearance CLEAR  CLEAR   Specific Gravity, Urine 1.020  1.005 - 1.030   pH 5.0  5.0 - 8.0   Glucose, UA NEG  NEG mg/dL   Bilirubin Urine NEG  NEG   Ketones, ur NEG  NEG mg/dL   Hgb urine dipstick NEG  NEG   Protein, ur NEG  NEG mg/dL   Urobilinogen, UA 0.2  0.0 - 1.0 mg/dL   Nitrite NEG  NEG   Leukocytes, UA NEG  NEG  Results for orders placed during the hospital encounter of 12/24/11 (from the past 8736 hour(s))  COMPREHENSIVE METABOLIC PANEL   Collection Time    12/24/11 10:23 AM      Result Value Range   Sodium 138  135 - 145 mEq/L   Potassium 4.6  3.5 - 5.1 mEq/L   Chloride 102  96 - 112 mEq/L   CO2 28  19 - 32 mEq/L   Glucose, Bld 95  70 - 99 mg/dL   BUN 7  6 - 23 mg/dL   Creatinine, Ser 1.61  0.50 - 1.10 mg/dL   Calcium 9.3  8.4 - 09.6 mg/dL   Total Protein 7.4  6.0 - 8.3 g/dL   Albumin 3.8  3.5 - 5.2 g/dL   AST 18  0 - 37 U/L   ALT 10  0 - 35 U/L   Alkaline Phosphatase 66  39 - 117 U/L   Total Bilirubin 0.2 (*) 0.3 - 1.2 mg/dL   GFR calc non Af Amer 56 (*) >90  mL/min   GFR calc Af Amer 65 (*) >90 mL/min  LIPASE, BLOOD   Collection Time    12/24/11 10:23 AM      Result Value Range   Lipase 20  11 - 59 U/L  CBC WITH DIFFERENTIAL   Collection Time    12/24/11 10:23 AM      Result Value Range   WBC 6.0  4.0 - 10.5 K/uL   RBC 4.82  3.87 - 5.11 MIL/uL   Hemoglobin 15.0  12.0 - 15.0 g/dL   HCT 04.5  40.9 - 81.1 %   MCV 92.3  78.0 - 100.0 fL   MCH 31.1  26.0 - 34.0 pg   MCHC 33.7  30.0 - 36.0 g/dL   RDW 91.4  78.2 - 95.6 %   Platelets 262  150 - 400 K/uL   Neutrophils Relative 56  43 - 77 %   Neutro Abs 3.4  1.7 - 7.7 K/uL   Lymphocytes Relative 36  12 - 46 %   Lymphs Abs 2.2  0.7 - 4.0 K/uL   Monocytes Relative 6  3 - 12 %   Monocytes Absolute 0.3  0.1 - 1.0 K/uL   Eosinophils Relative 2  0 - 5 %   Eosinophils Absolute 0.1  0.0 - 0.7 K/uL   Basophils Relative 0  0 - 1 %   Basophils Absolute 0.0  0.0 - 0.1 K/uL  URINALYSIS, ROUTINE W REFLEX MICROSCOPIC   Collection Time    12/24/11 10:42 AM      Result Value Range   Color, Urine YELLOW  YELLOW   APPearance CLEAR  CLEAR   Specific Gravity, Urine <1.005 (*) 1.005 - 1.030   pH 6.0  5.0 - 8.0   Glucose, UA NEGATIVE  NEGATIVE mg/dL   Hgb urine dipstick NEGATIVE  NEGATIVE   Bilirubin Urine NEGATIVE  NEGATIVE   Ketones, ur  NEGATIVE  NEGATIVE mg/dL   Protein, ur NEGATIVE  NEGATIVE mg/dL   Urobilinogen, UA 0.2  0.0 - 1.0 mg/dL   Nitrite NEGATIVE  NEGATIVE   Leukocytes, UA NEGATIVE  NEGATIVE  POCT URINALYSIS DIPSTICK   Collection Time    12/24/11  3:23 PM      Result Value Range   Color, UA yellow     Clarity, UA clear     Glucose, UA neg     Bilirubin, UA neg     Ketones, UA neg     Spec Grav, UA <=1.005     Blood, UA neg     pH, UA 6.0     Protein, UA neg     Urobilinogen, UA 0.2     Nitrite, UA neg     Leukocytes, UA Negative    Results for orders placed in visit on 12/14/11 (from the past 8736 hour(s))  COMPREHENSIVE METABOLIC PANEL   Collection Time    12/14/11  3:14 PM       Result Value Range   Sodium 143  135 - 145 mEq/L   Potassium 4.7  3.5 - 5.3 mEq/L   Chloride 102  96 - 112 mEq/L   CO2 33 (*) 19 - 32 mEq/L   Glucose, Bld 88  70 - 99 mg/dL   BUN 16  6 - 23 mg/dL   Creat 1.61  0.96 - 0.45 mg/dL   Total Bilirubin 0.1 (*) 0.3 - 1.2 mg/dL   Alkaline Phosphatase 69  39 - 117 U/L   AST 14  0 - 37 U/L   ALT 11  0 - 35 U/L   Total Protein 7.1  6.0 - 8.3 g/dL   Albumin 3.8  3.5 - 5.2 g/dL   Calcium 9.5  8.4 - 40.9 mg/dL  CBC WITH DIFFERENTIAL   Collection Time    12/14/11  3:14 PM      Result Value Range   WBC 6.8  4.0 - 10.5 K/uL   RBC 4.92  3.87 - 5.11 MIL/uL   Hemoglobin 15.3 (*) 12.0 - 15.0 g/dL   HCT 81.1 (*) 91.4 - 78.2 %   MCV 93.7  78.0 - 100.0 fL   MCH 31.1  26.0 - 34.0 pg   MCHC 33.2  30.0 - 36.0 g/dL   RDW 95.6  21.3 - 08.6 %   Platelets 271  150 - 400 K/uL   Neutrophils Relative 52  43 - 77 %   Neutro Abs 3.5  1.7 - 7.7 K/uL   Lymphocytes Relative 37  12 - 46 %   Lymphs Abs 2.5  0.7 - 4.0 K/uL   Monocytes Relative 8  3 - 12 %   Monocytes Absolute 0.5  0.1 - 1.0 K/uL   Eosinophils Relative 3  0 - 5 %   Eosinophils Absolute 0.2  0.0 - 0.7 K/uL   Basophils Relative 1  0 - 1 %   Basophils Absolute 0.0  0.0 - 0.1 K/uL   Smear Review 0/24    LIPASE   Collection Time    12/14/11  3:14 PM      Result Value Range   Lipase 31  11 - 59 U/L  POCT URINALYSIS DIPSTICK   Collection Time    12/14/11  3:41 PM      Result Value Range   Color, UA yellow     Clarity, UA clear     Glucose, UA neg     Bilirubin, UA  small     Ketones, UA trace     Spec Grav, UA 1.025     Blood, UA neg     pH, UA 6.5     Protein, UA neg     Urobilinogen, UA 0.2     Nitrite, UA neg     Leukocytes, UA Negative    PCP draws routine labs and nothing is emerging as of concern.  Diagnoses Axis I  Major depressive disorder, rule out bipolar disorder  Axis II deferred Axis III see medical history Axis IV moderate Axis V 55-60  Plan: I took her vitals.   I reviewed CC, tobacco/med/surg Hx, meds effects/ side effects, problem list, therapies and responses as well as current situation/symptoms discussed options. See orders and pt instructions for more details. Will push BuSpar.  MEDICATIONS this encounter: Meds ordered this encounter  Medications  . ziprasidone (GEODON) 80 MG capsule    Sig: Take 1 capsule (80 mg total) by mouth 2 (two) times daily with a meal.    Dispense:  120 capsule    Refill:  2  . propranolol (INDERAL) 10 MG tablet    Sig: Take 1 tablet (10 mg total) by mouth 3 (three) times daily.    Dispense:  90 tablet    Refill:  1  . busPIRone (BUSPAR) 15 MG tablet    Sig: Take 1 tablet (15 mg total) by mouth 4 (four) times daily.    Dispense:  120 tablet    Refill:  1  . lidocaine (LIDODERM) 5 %    Sig: Place 1 patch onto the skin every 12 (twelve) hours. Remove & Discard patch within 12 hours or as directed by MD    Dispense:  30 patch    Refill:  2    Medical Decision Making Problem Points:  Established problem, stable/improving (1), Review of last therapy session (1) and Review of psycho-social stressors (1) Data Points:  Review or order clinical lab tests (1) Review of medication regiment & side effects (2)  I certify that outpatient services furnished can reasonably be expected to improve the patient's condition.   Orson Aloe, MD, [facility]

## 2012-05-05 ENCOUNTER — Encounter: Payer: Self-pay | Admitting: Family Medicine

## 2012-05-05 ENCOUNTER — Ambulatory Visit (INDEPENDENT_AMBULATORY_CARE_PROVIDER_SITE_OTHER): Payer: Medicare Other | Admitting: Family Medicine

## 2012-05-05 ENCOUNTER — Telehealth: Payer: Self-pay | Admitting: Family Medicine

## 2012-05-05 ENCOUNTER — Other Ambulatory Visit: Payer: Self-pay

## 2012-05-05 VITALS — BP 182/90 | HR 98 | Temp 99.0°F | Resp 18 | Ht 67.0 in | Wt 200.1 lb

## 2012-05-05 DIAGNOSIS — R030 Elevated blood-pressure reading, without diagnosis of hypertension: Secondary | ICD-10-CM

## 2012-05-05 DIAGNOSIS — R232 Flushing: Secondary | ICD-10-CM

## 2012-05-05 DIAGNOSIS — IMO0001 Reserved for inherently not codable concepts without codable children: Secondary | ICD-10-CM

## 2012-05-05 DIAGNOSIS — F419 Anxiety disorder, unspecified: Secondary | ICD-10-CM

## 2012-05-05 DIAGNOSIS — R002 Palpitations: Secondary | ICD-10-CM

## 2012-05-05 LAB — VITAMIN D 1,25 DIHYDROXY: Vitamin D 1, 25 (OH)2 Total: 34 pg/mL (ref 18–72)

## 2012-05-05 MED ORDER — PROPRANOLOL HCL 10 MG PO TABS: 20.0000 mg | ORAL_TABLET | Freq: Three times a day (TID) | ORAL | Status: DC

## 2012-05-05 NOTE — Assessment & Plan Note (Signed)
Increase BB per above Labs normal

## 2012-05-05 NOTE — Progress Notes (Signed)
  Subjective:    Patient ID: Tonya Sullivan, female    DOB: 08-Dec-1955, 57 y.o.   MRN: [id number]  HPI  Pt here after multiple flushing episodes , she gets warm gets flushed diaphoretic, dizzy, has palpitations SOB at times, once occurred while eating and began to choke. Feels fatigued afterwards. No specific triggers, lasts 45 minutes then she feels fine. Witnessed by her daughter.  She took 2 doses of a steroid pill called in by her neurologist this weekend Episodes have been occuring for years, worsened past few weeks Review of Systems - per above  GEN-+ fatigue, fever, weight loss,weakness, recent illness HEENT- denies eye drainage, change in vision, nasal discharge, CVS- denies chest pain, +palpitations RESP- denies SOB, cough, wheeze ABD- denies N/V, change in stools, abd pain Neuro- denies headache, dizziness, syncope, seizure activity      Objective:   Physical Exam  GEN- NAD, alert and oriented x3 HEENT- PERRL, EOMI, non injected sclera, pink conjunctiva, MMM, oropharynx clear,  Neck- Supple,  CVS- tachycardia, no murmur RESP-CTAB EXT- No edema Pulses- Radial, DP- 2+ Skin- Flushed appearing Neuro- CNII-XII in tact, normal tone Upper and lower ext   EKG- Sinus tachycardia, PR 106    Assessment & Plan:

## 2012-05-05 NOTE — Assessment & Plan Note (Addendum)
Unclear cause, increased episodes recently though a chronic problem for pt ? Due to vasomotor , hormones, anxiety, heat, labs unremarkable CMET, TSH, CBC ? Due to carcinoid tumor , medications i.e. Steroids ? allergy  Increase BB, decrease TCA to 50mg  at bedtime x2 weeks, re evaluate Consider endocrine referral

## 2012-05-05 NOTE — Patient Instructions (Signed)
Propranolol increase to 20mg  three times a day Hold on the steroids  Try to stay cool Take 1/2 of the elavil at bedtime F/U by Phone in 2 weeks to see if symptoms have improved

## 2012-05-05 NOTE — Telephone Encounter (Signed)
Pt made appt

## 2012-05-05 NOTE — Telephone Encounter (Signed)
Patient states that she is having episodes of feeling flushed in the face, palpitations, and sweating.  She is thinking that she is having "heat strokes".  Please advise.

## 2012-05-05 NOTE — Assessment & Plan Note (Signed)
This may be related to above differentials, vs increased anxiety, which is very difficult to tease out in her Propranolol increased to 20ng TID Had cardiac work up in 2013, neg stress test

## 2012-05-06 ENCOUNTER — Telehealth: Payer: Self-pay | Admitting: Family Medicine

## 2012-05-06 ENCOUNTER — Ambulatory Visit (HOSPITAL_COMMUNITY): Payer: Medicare Other | Admitting: Psychiatry

## 2012-05-06 ENCOUNTER — Emergency Department (HOSPITAL_COMMUNITY)
Admission: EM | Admit: 2012-05-06 | Discharge: 2012-05-06 | Disposition: A | Discharge status: Home / Self Care | Payer: Medicare Other | Attending: Emergency Medicine | Admitting: Emergency Medicine

## 2012-05-06 ENCOUNTER — Encounter (HOSPITAL_COMMUNITY): Payer: Self-pay | Admitting: *Deleted

## 2012-05-06 DIAGNOSIS — F411 Generalized anxiety disorder: Secondary | ICD-10-CM | POA: Insufficient documentation

## 2012-05-06 DIAGNOSIS — E785 Hyperlipidemia, unspecified: Secondary | ICD-10-CM | POA: Insufficient documentation

## 2012-05-06 DIAGNOSIS — R002 Palpitations: Secondary | ICD-10-CM | POA: Insufficient documentation

## 2012-05-06 DIAGNOSIS — J4489 Other specified chronic obstructive pulmonary disease: Secondary | ICD-10-CM | POA: Insufficient documentation

## 2012-05-06 DIAGNOSIS — M549 Dorsalgia, unspecified: Secondary | ICD-10-CM | POA: Insufficient documentation

## 2012-05-06 DIAGNOSIS — F17200 Nicotine dependence, unspecified, uncomplicated: Secondary | ICD-10-CM | POA: Insufficient documentation

## 2012-05-06 DIAGNOSIS — Z8739 Personal history of other diseases of the musculoskeletal system and connective tissue: Secondary | ICD-10-CM | POA: Insufficient documentation

## 2012-05-06 DIAGNOSIS — Z791 Long term (current) use of non-steroidal anti-inflammatories (NSAID): Secondary | ICD-10-CM | POA: Insufficient documentation

## 2012-05-06 DIAGNOSIS — Z85828 Personal history of other malignant neoplasm of skin: Secondary | ICD-10-CM | POA: Insufficient documentation

## 2012-05-06 DIAGNOSIS — R232 Flushing: Secondary | ICD-10-CM

## 2012-05-06 DIAGNOSIS — I10 Essential (primary) hypertension: Secondary | ICD-10-CM

## 2012-05-06 DIAGNOSIS — Z79899 Other long term (current) drug therapy: Secondary | ICD-10-CM | POA: Insufficient documentation

## 2012-05-06 DIAGNOSIS — J449 Chronic obstructive pulmonary disease, unspecified: Secondary | ICD-10-CM | POA: Insufficient documentation

## 2012-05-06 DIAGNOSIS — R509 Fever, unspecified: Secondary | ICD-10-CM | POA: Insufficient documentation

## 2012-05-06 DIAGNOSIS — G8929 Other chronic pain: Secondary | ICD-10-CM | POA: Insufficient documentation

## 2012-05-06 DIAGNOSIS — F319 Bipolar disorder, unspecified: Secondary | ICD-10-CM | POA: Insufficient documentation

## 2012-05-06 DIAGNOSIS — R61 Generalized hyperhidrosis: Secondary | ICD-10-CM | POA: Insufficient documentation

## 2012-05-06 LAB — COMPREHENSIVE METABOLIC PANEL
AST: 25 U/L (ref 0–37)
Albumin: 3.6 g/dL (ref 3.5–5.2)
CO2: 29 mEq/L (ref 19–32)
Calcium: 9.2 mg/dL (ref 8.4–10.5)
Creatinine, Ser: 1.01 mg/dL (ref 0.50–1.10)
GFR calc non Af Amer: 61 mL/min — ABNORMAL LOW (ref >90)

## 2012-05-06 LAB — CBC WITH DIFFERENTIAL/PLATELET
Basophils Absolute: 0 10*3/uL (ref 0.0–0.1)
Basophils Relative: 0 % (ref 0–1)
Eosinophils Relative: 3 % (ref 0–5)
HCT: 42.2 % (ref 36.0–46.0)
MCH: 31.3 pg (ref 26.0–34.0)
MCHC: 34.6 g/dL (ref 30.0–36.0)
MCV: 90.6 fL (ref 78.0–100.0)
Monocytes Absolute: 0.6 10*3/uL (ref 0.1–1.0)
RDW: 13.6 % (ref 11.5–15.5)

## 2012-05-06 NOTE — ED Provider Notes (Signed)
History  This chart was scribed for Geoffery Lyons, MD by Shari Heritage, ED Scribe. The patient was seen in room [facility]18[facility]. Patient's care was started at 1602.   CSN: [id number]  Arrival date & time 05/06/12  1538   First MD Initiated Contact with Patient 05/06/12 [phone]      Chief Complaint  Patient presents with  . Palpitations    The history is provided by the patient. No language interpreter was used.    HPI Comments: Tonya Sullivan is a 57 y.o. female who presents to the Emergency Department complaining of intermittent palpitations for the past day. There is associated subjective fever and diaphoresis. She is currently without palpitations. Patient says that she has also had bilateral lower extremity swelling and sore throat. She states that she saw Dr. Jeanice Lim for this problem yesterday, but palpitations have persistent throughout today so she was advised to come to the ED for further evaluation.. She has a history of palpitations that began last year. She says that she was hospitalized for a cardiac workup for this problem in the past, but she no specific cause was identified. She states she usually has 2 episodes per day and they vary in length. She describes palpitations as "fluttering." She says that she takes her blood pressure during palpitations episodes and this morning it was 180/110. She measured her blood pressure 2 hours later and it was 160 systolic. Patient denies any chest pain or any other symptoms. She states that she drinks at least 6 cups of coffee per day and sometimes drinks Coke and tea. She also expresses concern that recent steroids prescribed by her PCP have contributed to symptoms. Patient says that she is diagnosed with melanoma recently which was removed by Dr. Margo Aye one week ago. She smokes 1 pack per day.    Past Medical History  Diagnosis Date  . COPD (chronic obstructive pulmonary disease)   . Depression   . Chronic back pain     DDD, disc bulge,  radiculopathy, spinal stenosis  . Hyperlipidemia   . Anxiety   . Bipolar disorder, unspecified   . Carotid artery calcification   . Obsessive-compulsive disorder   . DDD (degenerative disc disease), lumbosacral   . Spinal stenosis, lumbar   . Melanoma     removed 1 week ago at Dr Laurena Slimmer office    Past Surgical History  Procedure Laterality Date  . Appendectomy    . Tonsillectomy    . Cholecystectomy    . Lump left breast      removed- benign  . Abdominal hysterectomy      tubal pregnancy  . Inguinal hernia repair    . Stomach surgery      ?holes in esophgous  . Ectopic pregnancy surgery    . Back surgery      x 5;1984;1989;;1999;2000;2010  . Colonoscopy  01/25/2012    Procedure: COLONOSCOPY;  Surgeon: Malissa Hippo, MD;  Location: [facility];  Service: Endoscopy;  Laterality: N/A;  130    Family History  Problem Relation Age of Onset  . Hypertension Mother   . Heart disease Mother   . Heart attack Mother   . Anxiety disorder Mother   . Cancer Father     lung cancer  . Heart attack Father   . Alcohol abuse Father   . Hypertension Brother   . Alcohol abuse Brother   . Bipolar disorder Brother   . ADD / ADHD Brother   . Drug abuse Brother   .  OCD Brother   . Alcohol abuse Sister   . Bipolar disorder Sister   . Anxiety disorder Sister   . ADD / ADHD Sister   . Drug abuse Sister   . Physical abuse Sister   . Sexual abuse Sister   . Alcohol abuse Brother   . Bipolar disorder Brother   . ADD / ADHD Brother   . Drug abuse Brother   . Alcohol abuse Sister   . Bipolar disorder Sister   . Anxiety disorder Sister   . ADD / ADHD Sister   . Physical abuse Sister   . Sexual abuse Sister   . Alcohol abuse Brother   . Bipolar disorder Brother   . ADD / ADHD Brother   . Bipolar disorder Brother   . ADD / ADHD Brother   . Alcohol abuse Brother   . Paranoid behavior Brother   . Physical abuse Brother   . Sexual abuse Brother   . Bipolar disorder Maternal Aunt    . Bipolar disorder Paternal Aunt   . Bipolar disorder Maternal Uncle   . Bipolar disorder Paternal Uncle   . Bipolar disorder Maternal Grandfather   . Alcohol abuse Maternal Grandfather   . Bipolar disorder Maternal Grandmother   . Alcohol abuse Maternal Grandmother   . Anxiety disorder Maternal Grandmother   . Dementia Maternal Grandmother   . Bipolar disorder Paternal Grandfather   . Alcohol abuse Paternal Grandfather   . Bipolar disorder Paternal Grandmother   . Alcohol abuse Paternal Grandmother   . Drug abuse Paternal Grandmother   . Bipolar disorder Maternal Uncle   . Schizophrenia Neg Hx   . Seizures Neg Hx     History  Substance Use Topics  . Smoking status: Current Every Day Smoker -- 1.00 packs/day for 40 years    Types: Cigarettes  . Smokeless tobacco: Never Used     Comment: 3-6 cigs a day as of 05/02/2012  . Alcohol Use: No    OB History   Grav Para Term Preterm Abortions TAB SAB Ect Mult Living                  Review of Systems A complete 10 system review of systems was obtained and all systems are negative except as noted in the HPI and PMH.   Allergies  Codeine; Cyclobenzaprine; Darvocet; Abilify; Adhesive; Dilaudid; Iodine; Levofloxacin; Prednisone; and Sulfa antibiotics  Home Medications   Current Outpatient Rx  Name  Route  Sig  Dispense  Refill  . albuterol (PROVENTIL) (2.5 MG/3ML) 0.083% nebulizer solution   Nebulization   Take 3 mLs (2.5 mg total) by nebulization every 4 (four) hours as needed for wheezing.   75 mL   3   . amitriptyline (ELAVIL) 100 MG tablet   Oral   Take 1 tablet (100 mg total) by mouth at bedtime.   30 tablet   0   . busPIRone (BUSPAR) 15 MG tablet   Oral   Take 1 tablet (15 mg total) by mouth 4 (four) times daily.   120 tablet   1   . cyanocobalamin 500 MCG tablet   Oral   Take 500 mcg by mouth daily.         . DULoxetine (CYMBALTA) 30 MG capsule      Take by mouth three a day.   90 capsule   1   .  fish oil-omega-3 fatty acids 1000 MG capsule   Oral   Take 1 g by  mouth daily.         . hyoscyamine (LEVSIN/SL) 0.125 MG SL tablet   Sublingual   Place 1 tablet (0.125 mg total) under the tongue every 6 (six) hours as needed for cramping.   60 tablet   1   . lidocaine (LIDODERM) 5 %   Transdermal   Place 1 patch onto the skin every 12 (twelve) hours. Remove & Discard patch within 12 hours or as directed by MD   30 patch   2   . meloxicam (MOBIC) 7.5 MG tablet   Oral   Take 7.5 mg by mouth 2 (two) times daily.         . Multiple Vitamin (MULTIVITAMIN WITH MINERALS) TABS   Oral   Take 1 tablet by mouth daily.         Marland Kitchen oxyCODONE-acetaminophen (PERCOCET) 10-325 MG per tablet   Oral   Take 1 tablet by mouth every 6 (six) hours as needed. For pain         . pravastatin (PRAVACHOL) 40 MG tablet   Oral   Take 40 mg by mouth at bedtime.         . pravastatin (PRAVACHOL) 40 MG tablet      TAKE 1 TABLET BY MOUTH EVERY NIGHT AT BEDTIME   90 tablet   0   . propranolol (INDERAL) 10 MG tablet   Oral   Take 2 tablets (20 mg total) by mouth 3 (three) times daily.   270 tablet   1   . PROVENTIL HFA 108 (90 BASE) MCG/ACT inhaler      INHALE 2 PUFFS BY MOUTH EVERY 4 HOURS AS NEEDED FOR SHORTNESS OF BREATH   6.7 g   2   . tiotropium (SPIRIVA HANDIHALER) 18 MCG inhalation capsule   Inhalation   Place 1 capsule (18 mcg total) into inhaler and inhale daily.   30 capsule   6   . ziprasidone (GEODON) 80 MG capsule   Oral   Take 1 capsule (80 mg total) by mouth 2 (two) times daily with a meal.   120 capsule   2     Triage Vitals: BP 161/98  Pulse 89  Temp(Src) 97.5 F (36.4 C) (Oral)  Resp 18  Ht 5\' 7"  (1.702 m)  Wt 200 lb (90.719 kg)  BMI 31.32 kg/m2  SpO2 100%  Physical Exam  Constitutional: She is oriented to person, place, and time. She appears well-developed and well-nourished.  HENT:  Head: Normocephalic and atraumatic.  Mouth/Throat: Oropharynx is  clear and moist and mucous membranes are normal. Mucous membranes are not dry.  Eyes: Conjunctivae and EOM are normal. Pupils are equal, round, and reactive to light.  Neck: Normal range of motion. Neck supple.  Cardiovascular: Normal rate, regular rhythm and normal heart sounds.   No murmur heard. Pulmonary/Chest: Effort normal and breath sounds normal. No respiratory distress.  Abdominal: Soft. Bowel sounds are normal. There is no tenderness.  Musculoskeletal: Normal range of motion.  Neurological: She is alert and oriented to person, place, and time. She has normal reflexes.  Skin: Skin is warm and dry.  Psychiatric: She has a normal mood and affect.    ED Course  Procedures (including critical care time) DIAGNOSTIC STUDIES: Oxygen Saturation is 100% on room air, normal by my interpretation.    COORDINATION OF CARE: 4:12 PM- Patient informed of current plan for treatment and evaluation and agrees with plan at this time.      Labs Reviewed -  No data to display No results found.   No diagnosis found.   Date: 05/06/2012  Rate: 86  Rhythm: normal sinus rhythm  QRS Axis: normal  Intervals: normal  ST/T Wave abnormalities: normal  Conduction Disutrbances:none  Narrative Interpretation:   Old EKG Reviewed: unchanged    MDM  The patient presents with palpitations on and off for the past several days.  She was seen by her pcp yesterday and her bp med was increased.  She had two more episodes today and Dr. Jeanice Lim told her to home here.  Today's workup reveals a negative troponin and electrolytes that are unremarkable.  A TSH was obtained and is pending.  She has had no further ectopy while in the ED and appears stable for discharge to home.  I have advised her to follow up with Dr. Jeanice Lim to discuss event monitoring.  I spoke directly with Dr. Jeanice Lim who was updated on the results and plan.        I personally performed the services described in this documentation, which was  scribed in my presence. The recorded information has been reviewed and is accurate.      Geoffery Lyons, MD 05/06/12 [phone]

## 2012-05-06 NOTE — ED Notes (Signed)
Reporting palpitations x 2 yrs, worsening over past 2 wks.  Seen PCP yesterday and was told to increase anxiety meds and monitor bp for 2 wks.  States has had 2 episodes of palpitations today, denies any activity to initiate palpitations, unable to determine length due to variation.  Denies cp/sob.  States becomes diaphoretic and flushed.  NSR on monitor at this time.  Pt admits to drinking at least 6 cups of coffee/day along with sodas.

## 2012-05-06 NOTE — ED Notes (Signed)
Seen by Dr Jeanice Lim yesterday for palpitations, Increased dose of medication yesterday.  Continues to have palpitations and told to come to Er by Dr Jeanice Lim

## 2012-05-06 NOTE — Telephone Encounter (Signed)
I called and spoke with neurology regarding patient's injections and that she would not be available this Friday. They have been given her Kenalog injection which she has tolerated in the past without any adverse reaction. He did prescribe Decadron this weekend. She was also been given Percocet baclofen and Mobic and they state that every time she comes in today asking for more pain medications.  Patient came in today after a prolonged episode of flushing palpitations and blood pressure being elevated despite the propanolol she was highly anxious as well. She was advised to the ER for evaluation at that there was nothing more I can do in office for her. I did speak with the ER attending she did not have any arrhythmias on the monitor her blood pressure has been elevated but otherwise her exam and labs were all normal. He recommended an event monitor for the palpitations. I will speak to her about cardiology. Referral to endocrine- rule out pheo    Give message   - I spoke with ER doctor, everything looks okay with her labs and her heart work-up. I am going to send her to both the heart doctor and the endocrinologist.   The heart doctor will place a monitor to catch her heart rhythm when her episodes occur  The endocrinologist will looks for other rare reasons why her flushing and blood pressure are going up, such as tumors on glands that can cause this though rare  We will work to try to find an answer to her episodes.

## 2012-05-07 ENCOUNTER — Telehealth: Payer: Self-pay | Admitting: Family Medicine

## 2012-05-07 LAB — TSH: TSH: 2.502 u[IU]/mL (ref 0.350–4.500)

## 2012-05-07 NOTE — Telephone Encounter (Signed)
Patient is aware 

## 2012-05-08 ENCOUNTER — Ambulatory Visit (INDEPENDENT_AMBULATORY_CARE_PROVIDER_SITE_OTHER): Payer: Medicare Other | Admitting: Psychiatry

## 2012-05-08 ENCOUNTER — Telehealth: Payer: Self-pay | Admitting: Family Medicine

## 2012-05-08 DIAGNOSIS — F319 Bipolar disorder, unspecified: Secondary | ICD-10-CM

## 2012-05-08 DIAGNOSIS — F329 Major depressive disorder, single episode, unspecified: Secondary | ICD-10-CM

## 2012-05-08 MED ORDER — HYDROCHLOROTHIAZIDE 25 MG PO TABS: 25.0000 mg | ORAL_TABLET | Freq: Every day | ORAL | Status: DC
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
## 2012-05-08 NOTE — Telephone Encounter (Signed)
With patient. Her blood pressures have been elevated past couple of days her blood pressures have been 1:30 to 157/9211. She still has multiple flushing episodes with burning sensation and elevated pressure palpitations. She has an appointment with cardiology next week. She is taking propanolol 20 mg as prescribed. She also states that her legs were swelling. I will start her on hydrochlorothiazide 25 mg until she can be evaluated by cardiology

## 2012-05-08 NOTE — Patient Instructions (Signed)
Discussed orally 

## 2012-05-09 ENCOUNTER — Telehealth: Payer: Self-pay | Admitting: Family Medicine

## 2012-05-12 ENCOUNTER — Telehealth: Payer: Self-pay | Admitting: Family Medicine

## 2012-05-12 NOTE — Telephone Encounter (Signed)
Pt called Sunday 5/11 5pm, blood pressures have been elevated 130-160/90-130's, states this AM HR was 38 initially??, HR rest of day has been 90-120 Had another flushing episode. Note BP are taken with wrist cuff Current BP 156/90, HR  Around 100 No CP, no SOB, Advised her take her regularly scheduled medications at 6:30 including BP meds, and call back Pt called back ( 9pm) with BP 150/90  And HR 92, no further episodes Will see endocrine in AM

## 2012-05-13 ENCOUNTER — Other Ambulatory Visit: Payer: Self-pay

## 2012-05-13 ENCOUNTER — Telehealth: Payer: Self-pay | Admitting: Family Medicine

## 2012-05-13 ENCOUNTER — Emergency Department (HOSPITAL_COMMUNITY)
Admission: EM | Admit: 2012-05-13 | Discharge: 2012-05-13 | Disposition: A | Discharge status: Home / Self Care | Payer: Medicare Other | Attending: Emergency Medicine | Admitting: Emergency Medicine

## 2012-05-13 ENCOUNTER — Encounter (HOSPITAL_COMMUNITY): Payer: Self-pay | Admitting: Emergency Medicine

## 2012-05-13 ENCOUNTER — Ambulatory Visit (INDEPENDENT_AMBULATORY_CARE_PROVIDER_SITE_OTHER): Payer: Medicare Other | Admitting: Cardiology

## 2012-05-13 ENCOUNTER — Encounter: Payer: Self-pay | Admitting: Cardiology

## 2012-05-13 ENCOUNTER — Ambulatory Visit: Payer: Self-pay | Admitting: Cardiology

## 2012-05-13 VITALS — BP 150/98 | HR 107 | Ht 66.0 in | Wt 194.0 lb

## 2012-05-13 DIAGNOSIS — I10 Essential (primary) hypertension: Secondary | ICD-10-CM | POA: Insufficient documentation

## 2012-05-13 DIAGNOSIS — Z85820 Personal history of malignant melanoma of skin: Secondary | ICD-10-CM | POA: Insufficient documentation

## 2012-05-13 DIAGNOSIS — Z79899 Other long term (current) drug therapy: Secondary | ICD-10-CM | POA: Insufficient documentation

## 2012-05-13 DIAGNOSIS — G8929 Other chronic pain: Secondary | ICD-10-CM

## 2012-05-13 DIAGNOSIS — Z8739 Personal history of other diseases of the musculoskeletal system and connective tissue: Secondary | ICD-10-CM | POA: Insufficient documentation

## 2012-05-13 DIAGNOSIS — Z7902 Long term (current) use of antithrombotics/antiplatelets: Secondary | ICD-10-CM | POA: Insufficient documentation

## 2012-05-13 DIAGNOSIS — J449 Chronic obstructive pulmonary disease, unspecified: Secondary | ICD-10-CM

## 2012-05-13 DIAGNOSIS — I6523 Occlusion and stenosis of bilateral carotid arteries: Secondary | ICD-10-CM

## 2012-05-13 DIAGNOSIS — F17200 Nicotine dependence, unspecified, uncomplicated: Secondary | ICD-10-CM | POA: Insufficient documentation

## 2012-05-13 DIAGNOSIS — E785 Hyperlipidemia, unspecified: Secondary | ICD-10-CM | POA: Insufficient documentation

## 2012-05-13 DIAGNOSIS — F411 Generalized anxiety disorder: Secondary | ICD-10-CM | POA: Insufficient documentation

## 2012-05-13 DIAGNOSIS — J441 Chronic obstructive pulmonary disease with (acute) exacerbation: Secondary | ICD-10-CM | POA: Insufficient documentation

## 2012-05-13 DIAGNOSIS — F319 Bipolar disorder, unspecified: Secondary | ICD-10-CM

## 2012-05-13 DIAGNOSIS — R002 Palpitations: Secondary | ICD-10-CM | POA: Insufficient documentation

## 2012-05-13 DIAGNOSIS — Z8679 Personal history of other diseases of the circulatory system: Secondary | ICD-10-CM | POA: Insufficient documentation

## 2012-05-13 DIAGNOSIS — R61 Generalized hyperhidrosis: Secondary | ICD-10-CM | POA: Insufficient documentation

## 2012-05-13 DIAGNOSIS — R079 Chest pain, unspecified: Secondary | ICD-10-CM | POA: Insufficient documentation

## 2012-05-13 DIAGNOSIS — F429 Obsessive-compulsive disorder, unspecified: Secondary | ICD-10-CM | POA: Insufficient documentation

## 2012-05-13 DIAGNOSIS — I658 Occlusion and stenosis of other precerebral arteries: Secondary | ICD-10-CM

## 2012-05-13 DIAGNOSIS — M549 Dorsalgia, unspecified: Secondary | ICD-10-CM | POA: Insufficient documentation

## 2012-05-13 DIAGNOSIS — Z720 Tobacco use: Secondary | ICD-10-CM

## 2012-05-13 MED ORDER — LOSARTAN POTASSIUM 50 MG PO TABS: 50.0000 mg | ORAL_TABLET | Freq: Every day | ORAL | Status: DC

## 2012-05-13 NOTE — ED Notes (Signed)
Pt appears very anxious, states that her doctors have told her that her flushing and her elevated bp can be 2 of very serious diseases that are both treatable, however the doctor does not want to tell her what the names of the diseases are because he does not want her to worry.  States that she is getting ready to have some 24 hour urine tests done.  Brown staining noted about patient's mouth who states that she was drinking coffee this morning, states that she smokes cigarettes as well.  Pt informed of dangers of nicotine and caffeine use.

## 2012-05-13 NOTE — Assessment & Plan Note (Signed)
Patient made aware of the association of tobacco use and hypertension not to mention atherosclerosis with carotid artery disease. I strongly advised her to quit. He could not tolerate Chantix and says she cannot afford nicotine patches. I told her she will probably just need to make up her mind and  go cold [place].

## 2012-05-13 NOTE — ED Provider Notes (Signed)
History    This chart was scribed for Tonya Lennert, MD by Gerlean Ren, ED Scribe. This patient was seen in room APA02/AP[facility]02 and the patient's care was started at 3:12 PM    CSN: [id number]  Arrival date & time 05/13/12  1428   First MD Initiated Contact with Patient 05/13/12 1507      Chief Complaint  Patient presents with  . Hypertension  . Chest Pain     The history is provided by the patient. No language interpreter was used.  Tonya Sullivan is a 57 y.o. female who presents to the Emergency Department complaining of an episode of diaphoresis, dyspnea, palpitations, rapid BP increase, and flushed face that occurred earlier today and last 45-60 minutes.  Pt states these episodes have been occuring 2-3 times each day for the past 2-3 weeks, each lasting approximately 45-60 minutes.  Pt reports she saw an endocrinologist yesterday and has urine being tested at this time and blood to be tested soon.  Pt reports she started taking a diuretic 05/09 for BP.   Pt reported to the cardiologist at 2:30 PM today for these ongoing problems and was sent straight here before having any measurements taken due to BP that she reported having measured at home of 174/116 with pulse 115.      Past Medical History  Diagnosis Date  . COPD (chronic obstructive pulmonary disease)   . Depression   . Chronic back pain     DDD, disc bulge, radiculopathy, spinal stenosis  . Hyperlipidemia   . Anxiety   . Bipolar disorder, unspecified   . Carotid artery calcification   . Obsessive-compulsive disorder   . DDD (degenerative disc disease), lumbosacral   . Spinal stenosis, lumbar   . Melanoma     removed 1 week ago at Dr Laurena Slimmer office    Past Surgical History  Procedure Laterality Date  . Appendectomy    . Tonsillectomy    . Cholecystectomy    . Lump left breast      removed- benign  . Abdominal hysterectomy      tubal pregnancy  . Inguinal hernia repair    . Stomach surgery      ?holes in  esophgous  . Ectopic pregnancy surgery    . Back surgery      x 5;1984;1989;;1999;2000;2010  . Colonoscopy  01/25/2012    Procedure: COLONOSCOPY;  Surgeon: Malissa Hippo, MD;  Location: [facility];  Service: Endoscopy;  Laterality: N/A;  130    Family History  Problem Relation Age of Onset  . Hypertension Mother   . Heart disease Mother   . Heart attack Mother   . Anxiety disorder Mother   . Cancer Father     lung cancer  . Heart attack Father   . Alcohol abuse Father   . Hypertension Brother   . Alcohol abuse Brother   . Bipolar disorder Brother   . ADD / ADHD Brother   . Drug abuse Brother   . OCD Brother   . Alcohol abuse Sister   . Bipolar disorder Sister   . Anxiety disorder Sister   . ADD / ADHD Sister   . Drug abuse Sister   . Physical abuse Sister   . Sexual abuse Sister   . Alcohol abuse Brother   . Bipolar disorder Brother   . ADD / ADHD Brother   . Drug abuse Brother   . Alcohol abuse Sister   . Bipolar disorder Sister   .  Anxiety disorder Sister   . ADD / ADHD Sister   . Physical abuse Sister   . Sexual abuse Sister   . Alcohol abuse Brother   . Bipolar disorder Brother   . ADD / ADHD Brother   . Bipolar disorder Brother   . ADD / ADHD Brother   . Alcohol abuse Brother   . Paranoid behavior Brother   . Physical abuse Brother   . Sexual abuse Brother   . Bipolar disorder Maternal Aunt   . Bipolar disorder Paternal Aunt   . Bipolar disorder Maternal Uncle   . Bipolar disorder Paternal Uncle   . Bipolar disorder Maternal Grandfather   . Alcohol abuse Maternal Grandfather   . Bipolar disorder Maternal Grandmother   . Alcohol abuse Maternal Grandmother   . Anxiety disorder Maternal Grandmother   . Dementia Maternal Grandmother   . Bipolar disorder Paternal Grandfather   . Alcohol abuse Paternal Grandfather   . Bipolar disorder Paternal Grandmother   . Alcohol abuse Paternal Grandmother   . Drug abuse Paternal Grandmother   . Bipolar disorder  Maternal Uncle   . Schizophrenia Neg Hx   . Seizures Neg Hx     History  Substance Use Topics  . Smoking status: Current Every Day Smoker -- 1.00 packs/day for 40 years    Types: Cigarettes  . Smokeless tobacco: Never Used     Comment: 3-6 cigs a day as of 05/02/2012  . Alcohol Use: No    OB History   Grav Para Term Preterm Abortions TAB SAB Ect Mult Living   2 1 1  1   1  1       Review of Systems  Constitutional: Positive for diaphoresis (resolved). Negative for appetite change and fatigue.  HENT: Negative for congestion, sinus pressure and ear discharge.   Eyes: Negative for discharge.  Respiratory: Positive for shortness of breath (resolved). Negative for cough.   Cardiovascular: Positive for palpitations (resolved).  Gastrointestinal: Negative for nausea, abdominal pain and diarrhea.  Genitourinary: Negative for frequency and hematuria.  Musculoskeletal: Negative for back pain.  Skin: Negative for rash.  Neurological: Negative for seizures and headaches.  Psychiatric/Behavioral: Negative for hallucinations.    Allergies  Codeine; Cyclobenzaprine; Darvocet; Abilify; Adhesive; Dilaudid; Iodine; Levofloxacin; Prednisone; and Sulfa antibiotics  Home Medications   Current Outpatient Rx  Name  Route  Sig  Dispense  Refill  . albuterol (PROVENTIL HFA) 108 (90 BASE) MCG/ACT inhaler   Inhalation   Inhale 2 puffs into the lungs every 6 (six) hours as needed for wheezing or shortness of breath.         Marland Kitchen albuterol (PROVENTIL) (2.5 MG/3ML) 0.083% nebulizer solution   Nebulization   Take 3 mLs (2.5 mg total) by nebulization every 4 (four) hours as needed for wheezing.   75 mL   3   . amitriptyline (ELAVIL) 100 MG tablet   Oral   Take 50 mg by mouth at bedtime.         Marland Kitchen aspirin EC 81 MG tablet   Oral   Take 162 mg by mouth daily.         . baclofen (LIORESAL) 10 MG tablet   Oral   Take 10 mg by mouth 2 (two) times daily.         . busPIRone (BUSPAR) 15 MG  tablet   Oral   Take 1 tablet (15 mg total) by mouth 4 (four) times daily.   120 tablet   1   .  cephALEXin (KEFLEX) 500 MG capsule   Oral   Take 500 mg by mouth daily as needed. For dental infections         . cyanocobalamin 500 MCG tablet   Oral   Take 500 mcg by mouth daily.         . DULoxetine (CYMBALTA) 30 MG capsule   Oral   Take 30 mg by mouth 3 (three) times daily. Take by mouth three a day.         . Evening Primrose Oil 1000 MG CAPS   Oral   Take 1 capsule by mouth daily.         . fish oil-omega-3 fatty acids 1000 MG capsule   Oral   Take 1 g by mouth every morning.          . hydrochlorothiazide (HYDRODIURIL) 25 MG tablet   Oral   Take 1 tablet (25 mg total) by mouth daily. For blood pressure and swelling   30 tablet   1   . lidocaine (LIDODERM) 5 %   Transdermal   Place 1 patch onto the skin every 12 (twelve) hours. Remove & Discard patch within 12 hours or as directed by MD   30 patch   2   . meloxicam (MOBIC) 15 MG tablet   Oral   Take 15 mg by mouth daily.         Marland Kitchen oxyCODONE-acetaminophen (PERCOCET) 10-325 MG per tablet   Oral   Take 1 tablet by mouth every 6 (six) hours as needed. For pain         . pravastatin (PRAVACHOL) 40 MG tablet   Oral   Take 40 mg by mouth at bedtime.         . propranolol (INDERAL) 10 MG tablet   Oral   Take 2 tablets (20 mg total) by mouth 3 (three) times daily.   270 tablet   1   . tiotropium (SPIRIVA HANDIHALER) 18 MCG inhalation capsule   Inhalation   Place 1 capsule (18 mcg total) into inhaler and inhale daily.   30 capsule   6   . ziprasidone (GEODON) 80 MG capsule   Oral   Take 1 capsule (80 mg total) by mouth 2 (two) times daily with a meal.   120 capsule   2   . triamcinolone (KENALOG) 0.1 % paste      1 application daily.           There were no vitals taken for this visit.  Physical Exam  Nursing note and vitals reviewed. Constitutional: She is oriented to person,  place, and time. She appears well-developed.  HENT:  Head: Normocephalic.  Eyes: Conjunctivae and EOM are normal. No scleral icterus.  Neck: Neck supple. No thyromegaly present.  Cardiovascular: Regular rhythm.  Exam reveals no gallop and no friction rub.   No murmur heard. Mildly tachycardic  Pulmonary/Chest: Effort normal. No stridor. She has no wheezes. She has no rales. She exhibits no tenderness.  Abdominal: She exhibits no distension. There is no tenderness. There is no rebound.  Musculoskeletal: Normal range of motion. She exhibits no edema.  Lymphadenopathy:    She has no cervical adenopathy.  Neurological: She is oriented to person, place, and time. Coordination normal.  Skin: No rash noted. No erythema.  Psychiatric: She has a normal mood and affect. Her behavior is normal.    ED Course  Procedures (including critical care time) DIAGNOSTIC STUDIES: No O2 taken.  COORDINATION OF CARE: 3:19 PM- Patient informed of clinical course, understands medical decision-making process, and agrees with plan.  Labs Reviewed - No data to display No results found.   No diagnosis found.   Date: 05/13/2012  Rate:105  Rhythm: sinus tachycardia  QRS Axis: normal  Intervals: normal  ST/T Wave abnormalities: normal  Conduction Disutrbances:none  Narrative Interpretation:   Old EKG Reviewed: unchanged     MDM  Hypertensive episode.  Pt was sent from the cardiologist office today to the er because her bp was high.  Now her bp is mildly elevated.  We spoke with the cardiology office and they will see the pt today      The chart was scribed for me under my direct supervision.  I personally performed the history, physical, and medical decision making and all procedures in the evaluation of this patient.Tonya Lennert, MD 05/13/12 1540

## 2012-05-13 NOTE — Telephone Encounter (Signed)
Patient went to ED. Noted and agree
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
## 2012-05-13 NOTE — Patient Instructions (Addendum)
Start Losartan 50mg  daily.  Reduce Smoking  Your physician recommends that you schedule a follow-up appointment as needed.

## 2012-05-13 NOTE — Assessment & Plan Note (Signed)
Lipids are at goal including a high HDL. No change in pravastatin.

## 2012-05-13 NOTE — Assessment & Plan Note (Signed)
She is only on a diuretic at this point. She takes a very low dose of an old beta blocker, propranolol,  which is really more for anxiety. Because of her COPD, heavy smoking, and rhonchi I have chosen not to put her on ACE inhibitor. With her history of significant edema I will avoid a calcium channel blocker. At this point we'll add losartan 50 mg a day. Blood pressure goal reviewed with patient and is less than 140/90. We'll see her back on a when necessary basis. Telmisartan can be increased to 100mg  a day if necessary by primary care.
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
## 2012-05-13 NOTE — Assessment & Plan Note (Signed)
Patient advised to quit smoking. Continue secondary preventative therapy with statin and aspirin.

## 2012-05-13 NOTE — ED Notes (Signed)
Patient sent here today by cardiologist due to blood pressure and pulse 174/116, pulse 115. Patient reports being diaphoretic. Patient started taking diuretic for blood pressure Friday. Patient reports mid-sternal chest pain approx 20 minutes ago.

## 2012-05-14 ENCOUNTER — Telehealth: Payer: Self-pay | Admitting: Family Medicine

## 2012-05-14 NOTE — Telephone Encounter (Signed)
Returned patient call.  Left voicemail to return call.

## 2012-05-14 NOTE — Telephone Encounter (Signed)
Pcs form completed multiple times.  Patient does not qualify.

## 2012-05-14 NOTE — Telephone Encounter (Signed)
Noted.  Unable to reach patient.

## 2012-05-15 ENCOUNTER — Telehealth: Payer: Self-pay | Admitting: *Deleted

## 2012-05-15 ENCOUNTER — Ambulatory Visit (INDEPENDENT_AMBULATORY_CARE_PROVIDER_SITE_OTHER): Payer: Medicare Other | Admitting: Psychiatry

## 2012-05-15 ENCOUNTER — Telehealth (HOSPITAL_COMMUNITY): Payer: Self-pay | Admitting: *Deleted

## 2012-05-15 DIAGNOSIS — F319 Bipolar disorder, unspecified: Secondary | ICD-10-CM

## 2012-05-15 DIAGNOSIS — F329 Major depressive disorder, single episode, unspecified: Secondary | ICD-10-CM

## 2012-05-15 NOTE — Telephone Encounter (Signed)
Noted pt is currently on HCTZ 25mg  placed on 05-08-12 by PCP, pt confirmed she is taking this combo, pt advised to contact PCP concerning BP elevation per [facility] NP verbal based on the fact pt is on medication from PCP and losartan from [facility] is not working at this point, pt understood and will contact PCP promptly

## 2012-05-15 NOTE — Telephone Encounter (Signed)
.  left message to have patient return my call.  

## 2012-05-15 NOTE — Patient Instructions (Signed)
Discussed orally 

## 2012-05-15 NOTE — Progress Notes (Signed)
Patient:  Tonya Sullivan   DOB: Sep 02, 1955  MR Number: [id number]  [facility]:  [address] [place],  [place], [address]  Start: Thursday 05/15/2012 11:10 AM  Thursday 05/15/2012 11:40 AM End:   Provider/Observer:     Florencia Reasons, MSW, LCSW   Chief Complaint:      Chief Complaint  Patient presents with  . Anxiety  . Depression    Reason For Service:     The patient was referred for services by psychiatrist Dr. Lolly Mustache due to patient experiencing anxiety, stress, depression, and mood swings. She has a long-standing history of depression beginning in adolescence. Patient also has significant trauma history. Patient also experienced grief and loss issues due to to the death of her husband in 2010-10-01. Patient is seen today for follow up appointment.   Interventions Strategy:  Supportive therapy,  cognitive behavioral therapy  Participation Level:   Active  Participation Quality:  Appropriate      Behavioral Observation:  Well goomed, Alert, pleasant  Current Psychosocial Factors: Patient reports continued stress regarding sister still visiting.  Content of Session:   Reviewing symptoms, processing feelings, reinforcing patient's efforts to set and maintain boundaries, reinforcing use of  her support system,  and reviewing coping and relaxation techniques  Current Status:   Patient reports decreased agitation and anger. She remains very talkative and  thought process continues to reflect flight of ideas. She denies suicidal and homicidal thoughts. She denies any hallucinations.  Patient Progress:   Fair. The patient reports continued stress and anxiety related to sister's visit but has made successful efforts to set and maintain boundaries with her sister. Patient also has used her support system and reports staying with her son and his family last night to have time and space away from sister. Patient also has been using crafts and sewing to try to relax.  She expresses continued concerns about her sister's emotional health and realizes sister needs professional help. Therapist and patient discuss possible resources       Target Goals:   1. Improving mood as evidenced by resuming normal interest in activities and increased social involvement. 2. Increase self acceptance and improve self-esteem as evidenced by patient making positive statements about self. 3.  Improve coping and relaxation techniques. 4. Process and resolve grief and loss issues.   Last Reviewed:   04/06/2011  Goals Addressed Today:    Goal 3  Impression/Diagnosis:   The patient presents with a long-standing history of recurrent depression, mood swings, and a significant trauma history.  Diagnosis: Major Depressive Disorder  Diagnosis:  Axis I:  Major Depressive Disorder           Axis II: Deferred

## 2012-05-15 NOTE — Telephone Encounter (Signed)
Pt called to advise that her BP is 171/100 and pulse is 98, noted yesterday 129/75, was placed on losarton 50mg  once daily, pt denies SOB/chest pain, swelling has decrease since OV noted on 05-13-12 but still noted, pt advised that she is still having heart palpations, sweating profusely and noting chills as well on and off, notes sxs for the past 3 weeks periodically, also notes under care of endo chronologist Nida, reviewed pt ED visit noted on 05-13-12 and unable to locate VS recorded,  Was advised by [facility] TS that pt was not charged for ED visit per verbal instructions from MD Wall per pt was referred for OV with MD Wall and transferred from ED to our office for evaluation, please advise

## 2012-05-15 NOTE — Telephone Encounter (Signed)
Add HCTZ 12.5 mg to losartan 50 mg. Can be combo med or separately. She is to follow with PCP and psychiatry for ongoing medical issues

## 2012-05-16 ENCOUNTER — Encounter: Payer: Self-pay | Admitting: Family Medicine

## 2012-05-16 ENCOUNTER — Telehealth: Payer: Self-pay

## 2012-05-16 ENCOUNTER — Other Ambulatory Visit: Payer: Self-pay | Admitting: Family Medicine

## 2012-05-16 NOTE — Telephone Encounter (Signed)
Patient aware.

## 2012-05-16 NOTE — Telephone Encounter (Signed)
Her blood pressure may fluctuate, she needs time for the new pill that cardiology gave her to work Have her only take her blood pressure in the morning at least 1 hour after she has taken her blood pressure medications She can repeat in the evening after taking her evening dose of propranolol Please have her avoid taking BP multiple times during the day, her anxiety will make this worse She can f/u in the office next week- Monday or Tuesday for me to recheck her

## 2012-05-16 NOTE — Telephone Encounter (Signed)
Called patient back on another telephone msg.  Attempted to take care of this problem but patient declined.   Msg to close.

## 2012-05-20 ENCOUNTER — Ambulatory Visit (HOSPITAL_COMMUNITY): Payer: Self-pay | Admitting: Psychiatry

## 2012-05-22 ENCOUNTER — Encounter (HOSPITAL_COMMUNITY): Payer: Self-pay | Admitting: Psychiatry

## 2012-05-22 ENCOUNTER — Encounter: Payer: Self-pay | Admitting: Family Medicine

## 2012-05-22 ENCOUNTER — Ambulatory Visit (INDEPENDENT_AMBULATORY_CARE_PROVIDER_SITE_OTHER): Payer: Medicare Other | Admitting: Family Medicine

## 2012-05-22 ENCOUNTER — Ambulatory Visit (INDEPENDENT_AMBULATORY_CARE_PROVIDER_SITE_OTHER): Payer: Medicare Other | Admitting: Psychiatry

## 2012-05-22 ENCOUNTER — Telehealth: Payer: Self-pay | Admitting: Family Medicine

## 2012-05-22 VITALS — BP 134/94 | Ht 65.75 in | Wt 191.4 lb

## 2012-05-22 VITALS — BP 128/78 | HR 100 | Resp 18 | Ht 67.0 in | Wt 191.0 lb

## 2012-05-22 DIAGNOSIS — I10 Essential (primary) hypertension: Secondary | ICD-10-CM

## 2012-05-22 DIAGNOSIS — F329 Major depressive disorder, single episode, unspecified: Secondary | ICD-10-CM

## 2012-05-22 DIAGNOSIS — G8929 Other chronic pain: Secondary | ICD-10-CM

## 2012-05-22 DIAGNOSIS — Z720 Tobacco use: Secondary | ICD-10-CM

## 2012-05-22 DIAGNOSIS — M549 Dorsalgia, unspecified: Secondary | ICD-10-CM

## 2012-05-22 DIAGNOSIS — R490 Dysphonia: Secondary | ICD-10-CM

## 2012-05-22 DIAGNOSIS — R52 Pain, unspecified: Secondary | ICD-10-CM

## 2012-05-22 DIAGNOSIS — W19XXXA Unspecified fall, initial encounter: Secondary | ICD-10-CM

## 2012-05-22 DIAGNOSIS — F419 Anxiety disorder, unspecified: Secondary | ICD-10-CM

## 2012-05-22 DIAGNOSIS — F319 Bipolar disorder, unspecified: Secondary | ICD-10-CM

## 2012-05-22 MED ORDER — DULOXETINE HCL 30 MG PO CPEP: 30.0000 mg | ORAL_CAPSULE | Freq: Three times a day (TID) | ORAL | Status: DC

## 2012-05-22 MED ORDER — AMITRIPTYLINE HCL 100 MG PO TABS: 50.0000 mg | ORAL_TABLET | Freq: Every day | ORAL | Status: DC

## 2012-05-22 MED ORDER — ZIPRASIDONE HCL 80 MG PO CAPS: 80.0000 mg | ORAL_CAPSULE | Freq: Two times a day (BID) | ORAL | Status: DC

## 2012-05-22 MED ORDER — LIDOCAINE 5 % EX PTCH: 1.0000 | MEDICATED_PATCH | Freq: Two times a day (BID) | CUTANEOUS | Status: DC

## 2012-05-22 MED ORDER — BUSPIRONE HCL 15 MG PO TABS: 15.0000 mg | ORAL_TABLET | Freq: Four times a day (QID) | ORAL | Status: DC

## 2012-05-22 NOTE — Assessment & Plan Note (Signed)
BP looks great in the office no change the medications will await further workup regarding her blood pressure and flushing episodes unfortunately she is very anxious person which also makes her blood pressure labile

## 2012-05-22 NOTE — Progress Notes (Signed)
  Subjective:    Patient ID: Tonya Sullivan, female    DOB: 06/07/1955, 57 y.o.   MRN: [id number]  HPI  Patient presents to followup hypertension and flushing episodes. She was evaluated by cardiology was started and was added to her regimen otherwise cardiac-wise she was stable. She has been evaluated by endocrinology she had blood work as well as 24-hour urine collection returned this morning she is followup on Tuesday. She tells me there is concern about a rare thyroid disorder. She continues to have the episodes daily for blood pressure fluctuates it makes her very anxious. She is also dealing with her chronic back pain and now neck pain after she fell on Monday bruising her arm is hurting her neck. She was evaluated by her pain physician yesterday who is going to add topical lidocaine cream to her regimen.      Review of Systems  GEN- denies fatigue, fever, weight loss,weakness, recent illness HEENT- denies eye drainage, change in vision, nasal discharge, CVS- denies chest pain, palpitations RESP- denies SOB, cough, wheeze ABD- denies N/V, change in stools, abd pain GU- denies dysuria, hematuria, dribbling, incontinence MSK- + joint pain, muscle aches, injury Neuro- denies headache, dizziness, syncope, seizure activity      Objective:   Physical Exam GEN- NAD, alert and oriented x3 HEENT- PERRL, EOMI, non injected sclera, pink conjunctiva, MMM, oropharynx clear,  Neck- Supple, +spasm decreased ROM  CVS- tachycardia, no murmur RESP- few scattered wheeze, clears with cough ABD-NABS,soft,NT,ND EXT- No edema Pulses- Radial, DP- 2+ Skin- bruises on RUE,  Neuro- CNII-XII in tact, motor equal bilat       Assessment & Plan:

## 2012-05-22 NOTE — Assessment & Plan Note (Signed)
She's had a hoarse voice for the past couple of months in lieu of these other things going on she does have COPD. I will have to get her evaluated by ear nose and throat after she is evaluated for thyroid etiology by endocrinology

## 2012-05-22 NOTE — Assessment & Plan Note (Addendum)
Status post fall no focal deficits but she does have some bruising I will have her stop her aspirin for right now. She has pain medications as well as muscle relaxants She has complaint of fatigue and weakness as well per above. We will consider having her neurologist who is her pain doctor evaluate her for cannot find any cause of all of the symptoms

## 2012-05-22 NOTE — Patient Instructions (Addendum)
Stop the baby aspirin  Continue all other medications F/U 2 weeks

## 2012-05-22 NOTE — Telephone Encounter (Signed)
Pt can be seen this pm

## 2012-05-22 NOTE — Progress Notes (Signed)
[facility] [id number] Progress Note PHUNG KOTAS MRN: [id number] DOB: 1955/02/06 Age: 57 y.o.  Date: 05/22/2012 Start Time: 2:30 PM End Time: 2:45 PM  Chief Complaint: Chief Complaint  Patient presents with  . Depression  . Follow-up  . Medication Refill   Subjective: "I am scared.  My world has been turned upside down.". Depression 7/10 and Anxiety 17/10, where 1 is the best and 10 is the worst. Pain is 10/10 upper back after falling 2 to 3 days ago.  She has been off balance  Pt returns of appointment.  Pt reports that she is compliant with the psychotropic medications with fair benefit and no noticeable side effects. 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  She is being evaluated by an endocrinologist for possibly a thyroid auto immune problem.  Pt does not want to change anything, but she is most unhappy that none of her doctors have picked up on the most recent difficulties and has been just treated and treated and nothing looked at differently.  Current psychiatric medication Cymbalta 30 mg twice a day Geodon 80 mg twice a day BuSpar 10 mg four times a day Lidoderm q 12 hours  Mobic 7.5 mg once a day by Dr Judithann Sheen Current Outpatient Prescriptions  Medication Sig Dispense Refill  . amitriptyline (ELAVIL) 100 MG tablet Take 0.5 tablets (50 mg total) by mouth at bedtime.  15 tablet  2  . baclofen (LIORESAL) 10 MG tablet Take 10 mg by mouth 2 (two) times daily.      . busPIRone (BUSPAR) 15 MG tablet Take 1 tablet (15 mg total) by mouth 4 (four) times daily.  120 tablet  1  . DULoxetine (CYMBALTA) 30 MG capsule Take 1 capsule (30 mg total) by mouth 3 (three) times daily. Take by mouth three a day.  90 capsule  1  . lidocaine (LIDODERM) 5 % Place 1 patch onto the skin every 12 (twelve) hours. Remove & Discard patch within 12 hours or as directed by MD  30 patch  2  . meloxicam (MOBIC) 15 MG tablet Take 15 mg by mouth daily.      . propranolol  (INDERAL) 10 MG tablet Take 2 tablets (20 mg total) by mouth 3 (three) times daily.  270 tablet  1  . ziprasidone (GEODON) 80 MG capsule Take 1 capsule (80 mg total) by mouth 2 (two) times daily with a meal.  120 capsule  2  . albuterol (PROVENTIL HFA) 108 (90 BASE) MCG/ACT inhaler Inhale 2 puffs into the lungs every 6 (six) hours as needed for wheezing or shortness of breath.      Marland Kitchen albuterol (PROVENTIL) (2.5 MG/3ML) 0.083% nebulizer solution Take 3 mLs (2.5 mg total) by nebulization every 4 (four) hours as needed for wheezing.  75 mL  3  . aspirin EC 81 MG tablet Take 162 mg by mouth daily.      . cephALEXin (KEFLEX) 500 MG capsule Take 500 mg by mouth daily as needed. For dental infections      . cyanocobalamin 500 MCG tablet Take 500 mcg by mouth daily.      . Evening Primrose Oil 1000 MG CAPS Take 1 capsule by mouth daily.      . fish oil-omega-3 fatty acids 1000 MG capsule Take 1 g by mouth every morning.       . hydrochlorothiazide (HYDRODIURIL) 25 MG tablet Take 1 tablet (25 mg total) by mouth daily. For  blood pressure and swelling  30 tablet  1  . losartan (COZAAR) 50 MG tablet Take 1 tablet (50 mg total) by mouth daily.  90 tablet  3  . oxyCODONE-acetaminophen (PERCOCET) 10-325 MG per tablet Take 1 tablet by mouth every 6 (six) hours as needed. For pain      . pravastatin (PRAVACHOL) 40 MG tablet Take 40 mg by mouth at bedtime.      . pravastatin (PRAVACHOL) 40 MG tablet TAKE 1 TABLET BY MOUTH EVERY NIGHT AT BEDTIME  90 tablet  0  . tiotropium (SPIRIVA HANDIHALER) 18 MCG inhalation capsule Place 1 capsule (18 mcg total) into inhaler and inhale daily.  30 capsule  6  . triamcinolone (KENALOG) 0.1 % paste 1 application daily.      . [DISCONTINUED] Gabapentin, PHN, (GRALISE STARTER) 300 & 600 MG MISC Take 300-1,800 mg by mouth as directed. 15 DAY Starter pack doses     DAY 1- 300 mg     DAY 2- 600 mg     DAY 3 to 6 - 900 mg    DAY 7 to 10 - 1200 mg     DAY 11 to 14 - 1500 mg      DAY 15 -  1800 mg        No current facility-administered medications for this visit.    Past psychiatric history Patient endorse history of depression since age 89.  She has been at least 2 psychiatric admission due to suicidal thoughts.  She denies any history of suicidal attempt however endorse endorse history of paranoia and severe depression.  Her last psychiatric admission was January 2012 due to significant depression.  In the past she had tried gabapentin, Risperdal, trazodone, Abilify , Effexor, Valium, Lamictal, Klonopin and recently Wellbutrin.    Allergies: Allergies  Allergen Reactions  . Codeine Anaphylaxis  . Cyclobenzaprine Anaphylaxis  . Darvocet (Propoxyphene-Acetaminophen) Anaphylaxis  . Abilify (Aripiprazole) Other (See Comments)    tremors  . Adhesive (Tape) Hives  . Dilaudid (Hydromorphone Hcl) Nausea And Vomiting    Only the pills.   . Iodine Hives  . Levofloxacin Hives  . Prednisone Hives  . Sulfa Antibiotics Itching   Medical History: Past Medical History  Diagnosis Date  . COPD (chronic obstructive pulmonary disease)   . Depression   . Chronic back pain     DDD, disc bulge, radiculopathy, spinal stenosis  . Hyperlipidemia   . Anxiety   . Bipolar disorder, unspecified   . Carotid artery calcification   . Obsessive-compulsive disorder   . DDD (degenerative disc disease), lumbosacral   . Spinal stenosis, lumbar   . Melanoma     removed 1 week ago at Dr Laurena Slimmer office  . Thyroid disease   . Autoimmune thyroiditis    Surgical History: Past Surgical History  Procedure Laterality Date  . Appendectomy    . Tonsillectomy    . Cholecystectomy    . Lump left breast      removed- benign  . Abdominal hysterectomy      tubal pregnancy  . Inguinal hernia repair    . Stomach surgery      ?holes in esophgous  . Ectopic pregnancy surgery    . Back surgery      x 5;1984;1989;;1999;2000;2010  . Colonoscopy  01/25/2012    Procedure: COLONOSCOPY;  Surgeon: Malissa Hippo, MD;  Location: [facility];  Service: Endoscopy;  Laterality: N/A;  130   Family History: family history includes ADD /  ADHD in her brothers and sisters; Alcohol abuse in her brothers, father, maternal grandfather, maternal grandmother, paternal grandfather, paternal grandmother, and sisters; Anxiety disorder in her maternal grandmother, mother, and sisters; Bipolar disorder in her brothers, maternal aunt, maternal grandfather, maternal grandmother, maternal uncles, paternal aunt, paternal grandfather, paternal grandmother, paternal uncle, and sisters; Cancer in her father; Dementia in her maternal grandmother; Drug abuse in her brothers, paternal grandmother, and sister; Heart attack in her father and mother; Heart disease in her mother; Hypertension in her brother and mother; OCD in her brother; Paranoid behavior in her brother; Physical abuse in her brother and sisters; and Sexual abuse in her brother and sisters.  There is no history of Schizophrenia and Seizures. Reviewed again today during the visit  Mental status examination Patient is casually dressed and fairly groomed. She appears calm cooperative and relevant in conversation.  She maintained good eye contact.  Her speech is soft clear and coherent.  Her thought processes logical goal-directed.  She described her mood is good and her affect is improved from the past.  She denies any auditory or visual hallucination.  She denies any active or passive suicidal thoughts or homicidal thoughts.  There is some paranoia but there were no delusion present.  Her attention and concentration is better.  She's alert and oriented x3.  Her insight judgment and impulse control is okay.  Lab Results:  Results for orders placed during the hospital encounter of 05/06/12 (from the past 8736 hour(s))  CBC WITH DIFFERENTIAL   Collection Time    05/06/12  4:16 PM      Result Value Range   WBC 8.3  4.0 - 10.5 K/uL   RBC 4.66  3.87 - 5.11 MIL/uL    Hemoglobin 14.6  12.0 - 15.0 g/dL   HCT 40.9  81.1 - 91.4 %   MCV 90.6  78.0 - 100.0 fL   MCH 31.3  26.0 - 34.0 pg   MCHC 34.6  30.0 - 36.0 g/dL   RDW 78.2  95.6 - 21.3 %   Platelets 261  150 - 400 K/uL   Neutrophils Relative % 56  43 - 77 %   Neutro Abs 4.6  1.7 - 7.7 K/uL   Lymphocytes Relative 34  12 - 46 %   Lymphs Abs 2.8  0.7 - 4.0 K/uL   Monocytes Relative 8  3 - 12 %   Monocytes Absolute 0.6  0.1 - 1.0 K/uL   Eosinophils Relative 3  0 - 5 %   Eosinophils Absolute 0.2  0.0 - 0.7 K/uL   Basophils Relative 0  0 - 1 %   Basophils Absolute 0.0  0.0 - 0.1 K/uL  COMPREHENSIVE METABOLIC PANEL   Collection Time    05/06/12  4:16 PM      Result Value Range   Sodium 135  135 - 145 mEq/L   Potassium 4.1  3.5 - 5.1 mEq/L   Chloride 97  96 - 112 mEq/L   CO2 29  19 - 32 mEq/L   Glucose, Bld 117 (*) 70 - 99 mg/dL   BUN 17  6 - 23 mg/dL   Creatinine, Ser 0.86  0.50 - 1.10 mg/dL   Calcium 9.2  8.4 - 57.8 mg/dL   Total Protein 6.9  6.0 - 8.3 g/dL   Albumin 3.6  3.5 - 5.2 g/dL   AST 25  0 - 37 U/L   ALT 24  0 - 35 U/L   Alkaline Phosphatase 57  39 - 117 U/L   Total Bilirubin 0.3  0.3 - 1.2 mg/dL   GFR calc non Af Amer 61 (*) >90 mL/min   GFR calc Af Amer 71 (*) >90 mL/min  TROPONIN I   Collection Time    05/06/12  4:16 PM      Result Value Range   Troponin I <0.30  <0.30 ng/mL  TSH   Collection Time    05/06/12  4:16 PM      Result Value Range   TSH 2.502  0.350 - 4.500 uIU/mL  Results for orders placed in visit on 05/01/12 (from the past 8736 hour(s))  VITAMIN B12   Collection Time    05/01/12 11:58 AM      Result Value Range   Vitamin B-12 335  211 - 911 pg/mL  VITAMIN D 1,25 DIHYDROXY   Collection Time    05/01/12 11:58 AM      Result Value Range   Vitamin D 1, 25 (OH) Total 34  18 - 72 pg/mL   Vitamin D3 1, 25 (OH) 34     Vitamin D2 1, 25 (OH) <8    LIPID PANEL   Collection Time    05/01/12 11:55 AM      Result Value Range   Cholesterol 134  0 - 200 mg/dL    Triglycerides 79  <295 mg/dL   HDL 58  >28 mg/dL   Total CHOL/HDL Ratio 2.3     VLDL 16  0 - 40 mg/dL   LDL Cholesterol 60  0 - 99 mg/dL  CBC   Collection Time    05/01/12 11:55 AM      Result Value Range   WBC 6.2  4.0 - 10.5 K/uL   RBC 4.52  3.87 - 5.11 MIL/uL   Hemoglobin 13.6  12.0 - 15.0 g/dL   HCT 41.3  24.4 - 01.0 %   MCV 88.5  78.0 - 100.0 fL   MCH 30.1  26.0 - 34.0 pg   MCHC 34.0  30.0 - 36.0 g/dL   RDW 27.2  53.6 - 64.4 %   Platelets 292  150 - 400 K/uL  COMPREHENSIVE METABOLIC PANEL   Collection Time    05/01/12 11:55 AM      Result Value Range   Sodium 140  135 - 145 mEq/L   Potassium 4.2  3.5 - 5.3 mEq/L   Chloride 105  96 - 112 mEq/L   CO2 25  19 - 32 mEq/L   Glucose, Bld 93  70 - 99 mg/dL   BUN 20  6 - 23 mg/dL   Creat 0.34  7.42 - 5.95 mg/dL   Total Bilirubin 0.2 (*) 0.3 - 1.2 mg/dL   Alkaline Phosphatase 55  39 - 117 U/L   AST 10  0 - 37 U/L   ALT 12  0 - 35 U/L   Total Protein 6.3  6.0 - 8.3 g/dL   Albumin 3.8  3.5 - 5.2 g/dL   Calcium 9.1  8.4 - 63.8 mg/dL  CK   Collection Time    05/01/12 11:55 AM      Result Value Range   Total CK 44  7 - 177 U/L  TSH   Collection Time    05/01/12 11:55 AM      Result Value Range   TSH 1.066  0.350 - 4.500 uIU/mL  Results for orders placed in visit on 01/17/12 (from the past 8736 hour(s))  URINALYSIS   Collection  Time    01/17/12 11:26 AM      Result Value Range   Color, Urine YELLOW  YELLOW   APPearance CLEAR  CLEAR   Specific Gravity, Urine 1.020  1.005 - 1.030   pH 5.0  5.0 - 8.0   Glucose, UA NEG  NEG mg/dL   Bilirubin Urine NEG  NEG   Ketones, ur NEG  NEG mg/dL   Hgb urine dipstick NEG  NEG   Protein, ur NEG  NEG mg/dL   Urobilinogen, UA 0.2  0.0 - 1.0 mg/dL   Nitrite NEG  NEG   Leukocytes, UA NEG  NEG  Results for orders placed during the hospital encounter of 12/24/11 (from the past 8736 hour(s))  COMPREHENSIVE METABOLIC PANEL   Collection Time    12/24/11 10:23 AM      Result Value Range    Sodium 138  135 - 145 mEq/L   Potassium 4.6  3.5 - 5.1 mEq/L   Chloride 102  96 - 112 mEq/L   CO2 28  19 - 32 mEq/L   Glucose, Bld 95  70 - 99 mg/dL   BUN 7  6 - 23 mg/dL   Creatinine, Ser 4.09  0.50 - 1.10 mg/dL   Calcium 9.3  8.4 - 81.1 mg/dL   Total Protein 7.4  6.0 - 8.3 g/dL   Albumin 3.8  3.5 - 5.2 g/dL   AST 18  0 - 37 U/L   ALT 10  0 - 35 U/L   Alkaline Phosphatase 66  39 - 117 U/L   Total Bilirubin 0.2 (*) 0.3 - 1.2 mg/dL   GFR calc non Af Amer 56 (*) >90 mL/min   GFR calc Af Amer 65 (*) >90 mL/min  LIPASE, BLOOD   Collection Time    12/24/11 10:23 AM      Result Value Range   Lipase 20  11 - 59 U/L  CBC WITH DIFFERENTIAL   Collection Time    12/24/11 10:23 AM      Result Value Range   WBC 6.0  4.0 - 10.5 K/uL   RBC 4.82  3.87 - 5.11 MIL/uL   Hemoglobin 15.0  12.0 - 15.0 g/dL   HCT 91.4  78.2 - 95.6 %   MCV 92.3  78.0 - 100.0 fL   MCH 31.1  26.0 - 34.0 pg   MCHC 33.7  30.0 - 36.0 g/dL   RDW 21.3  08.6 - 57.8 %   Platelets 262  150 - 400 K/uL   Neutrophils Relative % 56  43 - 77 %   Neutro Abs 3.4  1.7 - 7.7 K/uL   Lymphocytes Relative 36  12 - 46 %   Lymphs Abs 2.2  0.7 - 4.0 K/uL   Monocytes Relative 6  3 - 12 %   Monocytes Absolute 0.3  0.1 - 1.0 K/uL   Eosinophils Relative 2  0 - 5 %   Eosinophils Absolute 0.1  0.0 - 0.7 K/uL   Basophils Relative 0  0 - 1 %   Basophils Absolute 0.0  0.0 - 0.1 K/uL  URINALYSIS, ROUTINE W REFLEX MICROSCOPIC   Collection Time    12/24/11 10:42 AM      Result Value Range   Color, Urine YELLOW  YELLOW   APPearance CLEAR  CLEAR   Specific Gravity, Urine <1.005 (*) 1.005 - 1.030   pH 6.0  5.0 - 8.0   Glucose, UA NEGATIVE  NEGATIVE mg/dL   Hgb urine  dipstick NEGATIVE  NEGATIVE   Bilirubin Urine NEGATIVE  NEGATIVE   Ketones, ur NEGATIVE  NEGATIVE mg/dL   Protein, ur NEGATIVE  NEGATIVE mg/dL   Urobilinogen, UA 0.2  0.0 - 1.0 mg/dL   Nitrite NEGATIVE  NEGATIVE   Leukocytes, UA NEGATIVE  NEGATIVE  POCT URINALYSIS  DIPSTICK   Collection Time    12/24/11  3:23 PM      Result Value Range   Color, UA yellow     Clarity, UA clear     Glucose, UA neg     Bilirubin, UA neg     Ketones, UA neg     Spec Grav, UA <=1.005     Blood, UA neg     pH, UA 6.0     Protein, UA neg     Urobilinogen, UA 0.2     Nitrite, UA neg     Leukocytes, UA Negative    Results for orders placed in visit on 12/14/11 (from the past 8736 hour(s))  COMPREHENSIVE METABOLIC PANEL   Collection Time    12/14/11  3:14 PM      Result Value Range   Sodium 143  135 - 145 mEq/L   Potassium 4.7  3.5 - 5.3 mEq/L   Chloride 102  96 - 112 mEq/L   CO2 33 (*) 19 - 32 mEq/L   Glucose, Bld 88  70 - 99 mg/dL   BUN 16  6 - 23 mg/dL   Creat 8.65  7.84 - 6.96 mg/dL   Total Bilirubin 0.1 (*) 0.3 - 1.2 mg/dL   Alkaline Phosphatase 69  39 - 117 U/L   AST 14  0 - 37 U/L   ALT 11  0 - 35 U/L   Total Protein 7.1  6.0 - 8.3 g/dL   Albumin 3.8  3.5 - 5.2 g/dL   Calcium 9.5  8.4 - 29.5 mg/dL  CBC WITH DIFFERENTIAL   Collection Time    12/14/11  3:14 PM      Result Value Range   WBC 6.8  4.0 - 10.5 K/uL   RBC 4.92  3.87 - 5.11 MIL/uL   Hemoglobin 15.3 (*) 12.0 - 15.0 g/dL   HCT 28.4 (*) 13.2 - 44.0 %   MCV 93.7  78.0 - 100.0 fL   MCH 31.1  26.0 - 34.0 pg   MCHC 33.2  30.0 - 36.0 g/dL   RDW 10.2  72.5 - 36.6 %   Platelets 271  150 - 400 K/uL   Neutrophils Relative % 52  43 - 77 %   Neutro Abs 3.5  1.7 - 7.7 K/uL   Lymphocytes Relative 37  12 - 46 %   Lymphs Abs 2.5  0.7 - 4.0 K/uL   Monocytes Relative 8  3 - 12 %   Monocytes Absolute 0.5  0.1 - 1.0 K/uL   Eosinophils Relative 3  0 - 5 %   Eosinophils Absolute 0.2  0.0 - 0.7 K/uL   Basophils Relative 1  0 - 1 %   Basophils Absolute 0.0  0.0 - 0.1 K/uL   Smear Review 0/24    LIPASE   Collection Time    12/14/11  3:14 PM      Result Value Range   Lipase 31  11 - 59 U/L  POCT URINALYSIS DIPSTICK   Collection Time    12/14/11  3:41 PM      Result Value Range   Color, UA yellow  Clarity, UA clear     Glucose, UA neg     Bilirubin, UA small     Ketones, UA trace     Spec Grav, UA 1.025     Blood, UA neg     pH, UA 6.5     Protein, UA neg     Urobilinogen, UA 0.2     Nitrite, UA neg     Leukocytes, UA Negative    PCP draws routine labs and nothing is emerging as of concern.  Diagnoses Axis I  Major depressive disorder, rule out bipolar disorder  Axis II deferred Axis III see medical history Axis IV moderate Axis V 55-60  Plan: I took her vitals.  I reviewed CC, tobacco/med/surg Hx, meds effects/ side effects, problem list, therapies and responses as well as current situation/symptoms discussed options. Continue current effective medications. See orders and pt instructions for more details.  MEDICATIONS this encounter: Meds ordered this encounter  Medications  . ziprasidone (GEODON) 80 MG capsule    Sig: Take 1 capsule (80 mg total) by mouth 2 (two) times daily with a meal.    Dispense:  120 capsule    Refill:  2  . lidocaine (LIDODERM) 5 %    Sig: Place 1 patch onto the skin every 12 (twelve) hours. Remove & Discard patch within 12 hours or as directed by MD    Dispense:  30 patch    Refill:  2  . DULoxetine (CYMBALTA) 30 MG capsule    Sig: Take 1 capsule (30 mg total) by mouth 3 (three) times daily. Take by mouth three a day.    Dispense:  90 capsule    Refill:  1  . busPIRone (BUSPAR) 15 MG tablet    Sig: Take 1 tablet (15 mg total) by mouth 4 (four) times daily.    Dispense:  120 tablet    Refill:  1  . amitriptyline (ELAVIL) 100 MG tablet    Sig: Take 0.5 tablets (50 mg total) by mouth at bedtime.    Dispense:  15 tablet    Refill:  2    Medical Decision Making Problem Points:  Established problem, stable/improving (1), Review of last therapy session (1) and Review of psycho-social stressors (1) Data Points:  Review or order clinical lab tests (1) Review of medication regiment & side effects (2)  I certify that outpatient services  furnished can reasonably be expected to improve the patient's condition.   Orson Aloe, MD, [facility]

## 2012-05-30 ENCOUNTER — Ambulatory Visit (INDEPENDENT_AMBULATORY_CARE_PROVIDER_SITE_OTHER): Payer: Medicare Other | Admitting: Psychiatry

## 2012-05-30 DIAGNOSIS — F319 Bipolar disorder, unspecified: Secondary | ICD-10-CM

## 2012-05-30 NOTE — Progress Notes (Addendum)
Patient:  Tonya Sullivan   DOB: 1955/10/16  MR Number: [id number]  [facility]: [facility]:  [address] [place] [place],  [place], [address]  Start: Friday 05/30/2012 1:00 PM  Friday 05/30/2012 1:45 PM End:   Provider/Observer:     Florencia Reasons, MSW, LCSW   Chief Complaint:      Chief Complaint  Patient presents with  . Depression  . Anxiety    Reason For Service:     The patient was referred for services by psychiatrist Dr. Lolly Mustache due to patient experiencing anxiety, stress, depression, and mood swings. She has a long-standing history of depression beginning in adolescence. Patient also has significant trauma history. Patient also experienced grief and loss issues due to to the death of her husband in September 28, 2010. Patient is seen today for follow up appointment.   Interventions Strategy:  Supportive therapy,  cognitive behavioral therapy  Participation Level:   Active  Participation Quality:  Appropriate      Behavioral Observation:  Well goomed, Alert, pleasant  Current Psychosocial Factors: Patient reports sister has returned to [place]. Patient's son's girlfriend and daughter temporarily are residing with patient.  Content of Session:   Reviewing symptoms, processing feelings, discussing realistic expectations of self, encouraging patient to improve self-care efforts, reviewing coping and relaxation techniques  Current Status:   Patient reports improved mood and decreased anxiety. However, she is experiencing sleep difficulty only sleeping 4 hours per night.  Patient Progress:   Good. The patient reports decreased stress regarding sister as she has returned to [place]. Therapist reinforce patient's efforts to take care of self in setting boundaries with her sister as well as other family members including her mother. Patient rates  depression at  6 or 7 and anxiety at a 4 or 5 on a 10 point scale. She is beginning to improve self-care regarding her eating patterns. She  also reports increased interest in activities and has been doing crafts. Patient expresses appropriate concern about increased health issues as she now is seeing an endocrinologist and has just completed medical tests to determine a diagnosis. She is waiting for test results. Therapist and patient identify coping statements.  Patient is pleased she has been approved to receive 30 hours of CAP services per month. She expresses concern about her son and his girlfriend have been  discord but has been able to set boundaries and realizes the limits of her responsibility. She reports developing a closer relationship with her son's girlfriend but is remaining cautious due to their history.     Target Goals:   1. Improving mood as evidenced by resuming normal interest in activities and increased social involvement. 2. Increase self acceptance and improve self-esteem as evidenced by patient making positive statements about self. 3.  Improve coping and relaxation techniques. 4. Process and resolve grief and loss issues.   Last Reviewed:   04/06/2011  Goals Addressed Today:    Goals 2 and 3  Impression/Diagnosis:   The patient presents with a long-standing history of recurrent depression, mood swings, and a significant trauma history.  Diagnosis: Major Depressive Disorder  Diagnosis:  Axis I:  Bipolar Disorder           Axis II: Deferred

## 2012-05-30 NOTE — Patient Instructions (Signed)
Discussed orally 

## 2012-06-02 ENCOUNTER — Other Ambulatory Visit: Payer: Self-pay | Admitting: Family Medicine

## 2012-06-02 NOTE — Telephone Encounter (Signed)
Med refilled.

## 2012-06-02 NOTE — Telephone Encounter (Signed)
Forwarding to new office  

## 2012-06-05 ENCOUNTER — Telehealth: Payer: Self-pay | Admitting: Family Medicine

## 2012-06-05 NOTE — Telephone Encounter (Signed)
Pt is aware.  

## 2012-06-05 NOTE — Telephone Encounter (Signed)
I do not have the results of any of the testing, we will call and see if we can get any of the results FOr now take her same medications We dont have a diagnosis so nothing else can be given   Please call Dr.. NIDA office and have them fax me her last OV and set of labs that he did regarding the flushing/tachycardia.

## 2012-06-05 NOTE — Telephone Encounter (Signed)
Waiting on Dr. Fransico Him to return my call

## 2012-06-10 ENCOUNTER — Other Ambulatory Visit: Payer: Self-pay | Admitting: Neurology

## 2012-06-10 ENCOUNTER — Ambulatory Visit (HOSPITAL_COMMUNITY)
Admission: RE | Admit: 2012-06-10 | Discharge: 2012-06-10 | Disposition: A | Discharge status: Home / Self Care | Payer: Medicare Other | Source: Ambulatory Visit | Attending: Neurology | Admitting: Neurology

## 2012-06-10 DIAGNOSIS — M25519 Pain in unspecified shoulder: Secondary | ICD-10-CM

## 2012-06-10 DIAGNOSIS — W19XXXA Unspecified fall, initial encounter: Secondary | ICD-10-CM | POA: Insufficient documentation

## 2012-06-10 DIAGNOSIS — M5030 Other cervical disc degeneration, unspecified cervical region: Secondary | ICD-10-CM | POA: Insufficient documentation

## 2012-06-10 DIAGNOSIS — M542 Cervicalgia: Secondary | ICD-10-CM | POA: Insufficient documentation

## 2012-06-10 DIAGNOSIS — T07XXXA Unspecified multiple injuries, initial encounter: Secondary | ICD-10-CM | POA: Insufficient documentation

## 2012-06-11 ENCOUNTER — Ambulatory Visit: Payer: Medicare Other | Admitting: Family Medicine

## 2012-06-19 ENCOUNTER — Other Ambulatory Visit: Payer: Self-pay | Admitting: Family Medicine

## 2012-06-19 NOTE — Telephone Encounter (Signed)
Medication refilled per protocol. 

## 2012-06-20 ENCOUNTER — Ambulatory Visit (INDEPENDENT_AMBULATORY_CARE_PROVIDER_SITE_OTHER): Payer: Medicare Other | Admitting: Psychiatry

## 2012-06-20 DIAGNOSIS — F319 Bipolar disorder, unspecified: Secondary | ICD-10-CM

## 2012-06-20 NOTE — Patient Instructions (Signed)
Use daily schedule handouts and bring to next session.

## 2012-06-20 NOTE — Progress Notes (Signed)
Patient:  Tonya Sullivan   DOB: 08/24/1955  MR Number: [id number]  Location: [facility]:  [address] [place] [place],  [place], [address]  Start: Friday 06/20/2012 4:00 PM  Friday 06/20/2012 4:30 PM End:   Provider/Observer:     Florencia Reasons, MSW, LCSW   Chief Complaint:      Chief Complaint  Patient presents with  . Depression  . Anxiety    Reason For Service:     The patient was referred for services by psychiatrist Dr. Lolly Mustache due to patient experiencing anxiety, stress, depression, and mood swings. She has a long-standing history of depression beginning in adolescence. Patient also has significant trauma history. Patient also experienced grief and loss issues due to to the death of her husband in 2010/09/13. Patient is seen today for follow up appointment.   Interventions Strategy:  Supportive therapy,  cognitive behavioral therapy  Participation Level:   Active  Participation Quality:  Appropriate      Behavioral Observation:  Well goomed, Alert, pleasant  Current Psychosocial Factors:   Content of Session:   Reviewing symptoms, processing feelings, working with patient to identify ways to in decrease structure and become more self-directed and effective in reinvesting in life, reinforcing patient's efforts to set and maintain boundaries  Current Status:   Patient reports continued improved mood and decreased anxiety. However, she continues to experience sleep difficulty only sleeping 4 hours per night.   Patient Progress:   Good. The patient reports continued improvement in mood and increased interest in activities. She maintains close involvement with her son and his family and  has resumed her interest in sewing.  Patient reports seeing mother twice since last session but being able to be assertive as well as set and maintain boundaries with mother. Patient also reports being assertive requesting another aide after feeling uncomfortable with the first aide that was  assigned to her case. Patient reports increased feelings of being displaced and not knowing her role. Therapist works with patient to use a values clarification exercise to help patient become more conscious of her values at this time in her life. Therapist also works with patient to identify ways to increase structure in her life. Therapist and patient also review ways to improve sleep hygiene. Patient also plans to discuss sleep issues at her next medication management appointment with psychiatrist.  Target Goals:   1. Improving mood as evidenced by resuming normal interest in activities and increased social involvement. 2. Increase self acceptance and improve self-esteem as evidenced by patient making positive statements about self. 3.  Improve coping and relaxation techniques. 4. Process and resolve grief and loss issues.   Last Reviewed:   04/06/2011  Goals Addressed Today:    Goal 4  Impression/Diagnosis:   The patient presents with a long-standing history of recurrent depression, mood swings, and a significant trauma history.  Diagnosis: Major Depressive Disorder  Diagnosis:  Axis I:  Bipolar Disorder           Axis II: Deferred

## 2012-06-23 ENCOUNTER — Encounter: Payer: Self-pay | Admitting: Family Medicine

## 2012-07-11 ENCOUNTER — Ambulatory Visit (INDEPENDENT_AMBULATORY_CARE_PROVIDER_SITE_OTHER): Payer: Medicare Other | Admitting: Psychiatry

## 2012-07-11 DIAGNOSIS — F319 Bipolar disorder, unspecified: Secondary | ICD-10-CM

## 2012-07-14 ENCOUNTER — Ambulatory Visit (INDEPENDENT_AMBULATORY_CARE_PROVIDER_SITE_OTHER): Payer: Medicare Other | Admitting: Psychiatry

## 2012-07-14 DIAGNOSIS — F319 Bipolar disorder, unspecified: Secondary | ICD-10-CM

## 2012-07-15 NOTE — Patient Instructions (Signed)
Discussed orally 

## 2012-07-15 NOTE — Progress Notes (Signed)
Patient:  Tonya Sullivan   DOB: [birth year]  MR Number: [id number]  Location: [facility]:  [address] [place] [place],  [place], [address]  Start: Friday 07/11/2012 4:00 PM  Friday 07/11/2012 4:30 PM End:   Provider/Observer:     Florencia Reasons, MSW, LCSW   Chief Complaint:      Chief Complaint  Patient presents with  . Anxiety    Reason For Service:     The patient was referred for services by psychiatrist Dr. Lolly Mustache due to patient experiencing anxiety, stress, depression, and mood swings. She has a long-standing history of depression beginning in adolescence. Patient also has significant trauma history. Patient also experienced grief and loss issues due to to the death of her husband in 10/11/2010. Patient is seen today for follow up appointment.   Interventions Strategy:  Supportive therapy,  cognitive behavioral therapy  Participation Level:   Active  Participation Quality:  Appropriate      Behavioral Observation:  Well goomed, Alert, pleasant  Current Psychosocial Factors:   Content of Session:   Reviewing symptoms, processing feelings, reinforcing patient's efforts to set and maintain boundaries  Current Status:   Patient reports continued improved mood and decreased anxiety. However, she is experiencing increased anger  Patient Progress:   Good. The patient reports continued improvement in mood and increased interest in activities. However, she is experiencing increased anger regarding her mother due to to mother's increased efforts to contact patient. This has triggered increased memories of patient's trauma history. She expresses anger that mother has not accepted any of her responsibility regarding patient's trauma history. Therapist works with patient to process her feelings and encourages patient to journal. Patient reports she has decided to move in with her son and his family in 10-Oct-2012. Patient expresses concern regarding her health as she still does  not know the cause of her hot flashes and elevated blood pressure. She plans to contact her endocrinologist next week  Target Goals:   1. Improving mood as evidenced by resuming normal interest in activities and increased social involvement. 2. Increase self acceptance and improve self-esteem as evidenced by patient making positive statements about self. 3.  Improve coping and relaxation techniques. 4. Process and resolve grief and loss issues.   Last Reviewed:   04/06/2011  Goals Addressed Today:    Goals 3 and  4  Impression/Diagnosis:   The patient presents with a long-standing history of recurrent depression, mood swings, and a significant trauma history.  Diagnosis: Major Depressive Disorder  Diagnosis:  Axis I:  Bipolar Disorder           Axis II: Deferred

## 2012-07-15 NOTE — Progress Notes (Addendum)
Patient:  Tonya Sullivan   DOB: 20-Apr-1955  MR Number: [id number]  Location: [facility]:  [address] [place] [place],  [place], [address]  Start: Monday 07/14/2012 3:00 PM  Monday 07/14/2012 3:30 PM End:   Provider/Observer:     Florencia Reasons, MSW, LCSW   Chief Complaint:      Chief Complaint  Patient presents with  . Depression  . Anxiety    Reason For Service:     The patient was referred for services by psychiatrist Dr. Lolly Mustache due to patient experiencing anxiety, stress, depression, and mood swings. She has a long-standing history of depression beginning in adolescence. Patient also has significant trauma history. Patient also experienced grief and loss issues due to to the death of her husband in 09/26/2010. Patient is seen today which is earlier than her scheduled appointment as patient is experiencing increased grief and loss issues triggered by discovering events surrounding her husband's death.   Interventions Strategy:  Supportive therapy,  cognitive behavioral therapy  Participation Level:   Active  Participation Quality:  Appropriate      Behavioral Observation:  Well goomed, Alert, tearful  Current Psychosocial Factors: Patient reports discovering information this past weekend regarding husband's prescriptions that has led patient to think that her husband's death may have been a suicide.  Content of Session:   Reviewing symptoms, processing feelings, reviewing coping and relaxation techniques  Current Status:   Patient reports continued improved mood and decreased anxiety. However, she is experiencing increased anger  Patient Progress:   Fair. The patient reports reading her husband's medical file this past weekend and discovering that husband had filled prescriptions just prior to his death. Patient reports only a few of the 250 pills were found in patient's back pack after his death.  Patient now thinks husband may have completed suicide. This has triggered  feelings of anger, betrayal, and sadness. Patient also is experiencing guilt regarding her decision about not taking husband to ER based on  instructioins she received from medical personnel. Therapist works with patient to process her feelings, to identify coping statements, and to review coping and relaxation techniques. Patient reports increased pressure from her son and his girlfriend to move in with them this month. Patient expresses ambivalent feelings. Therapist works with patient to do a cost benefit analysis regarding her choices.  Target Goals:   1. Improving mood as evidenced by resuming normal interest in activities and increased social involvement. 2. Increase self acceptance and improve self-esteem as evidenced by patient making positive statements about self. 3.  Improve coping and relaxation techniques. 4. Process and resolve grief and loss issues.   Last Reviewed:   04/06/2011  Goals Addressed Today:    Goals 3 and  4  Impression/Diagnosis:   The patient presents with a long-standing history of recurrent depression, mood swings, and a significant trauma history.  Diagnosis: Major Depressive Disorder  Diagnosis:  Axis I:  Bipolar Disorder           Axis II: Deferred

## 2012-07-18 ENCOUNTER — Ambulatory Visit (INDEPENDENT_AMBULATORY_CARE_PROVIDER_SITE_OTHER): Payer: Medicare Other | Admitting: Family Medicine

## 2012-07-18 VITALS — BP 120/70 | HR 68 | Temp 98.1°F | Resp 16 | Wt 199.0 lb

## 2012-07-18 DIAGNOSIS — I10 Essential (primary) hypertension: Secondary | ICD-10-CM

## 2012-07-18 DIAGNOSIS — R233 Spontaneous ecchymoses: Secondary | ICD-10-CM

## 2012-07-18 DIAGNOSIS — R232 Flushing: Secondary | ICD-10-CM

## 2012-07-18 LAB — PT WITH INR/FINGERSTICK: PT, fingerstick: 11.6 seconds (ref 10.4–12.5)

## 2012-07-18 NOTE — Patient Instructions (Addendum)
Continue current medication Restart multivitamin and B12  We will call with lab results  F/U 4 months

## 2012-07-20 ENCOUNTER — Encounter: Payer: Self-pay | Admitting: Family Medicine

## 2012-07-20 DIAGNOSIS — R233 Spontaneous ecchymoses: Secondary | ICD-10-CM | POA: Insufficient documentation

## 2012-07-20 NOTE — Assessment & Plan Note (Signed)
Unchanged episodes, await endocrine work up, anxiety is a definite factor

## 2012-07-20 NOTE — Assessment & Plan Note (Signed)
BP looks good, no change to meds 

## 2012-07-20 NOTE — Progress Notes (Signed)
  Subjective:    Patient ID: Tonya Sullivan, female    DOB: [birth year], 57 y.o.   MRN: [id number]  HPI  Pt here to f/u chronic medical problems. Seen by endocrine for the recurrent episodes of HTN, palpations, flushing, repeat urine studies to be done, cortisol was also at high normal but for now non-specific. Seen by cardiology no further work up BP has been well controlled.  Continues to follow with pain clinic, with minimal improvement in her neck and back pain, to see a neurosurgeon for her neck Continues to have spontaneous bruises on both arms, not taking Aspirin  Review of Systems   GEN- + fatigue, fever, weight loss,weakness, recent illness HEENT- denies eye drainage, change in vision, nasal discharge, CVS- denies chest pain, +palpitations RESP- denies SOB, cough, wheeze ABD- denies N/V, change in stools, abd pain GU- denies dysuria, hematuria, dribbling, incontinence MSK- + joint pain, muscle aches, injury Neuro- denies headache, dizziness, syncope, seizure activity      Objective:   Physical Exam GEN- NAD, alert and oriented x3 HEENT- PERRL, EOMI, non injected sclera, pink conjunctiva, MMM, oropharynx clear,  Neck- Supple,  CVS- RRR, no murmur RESP- CTAB EXT- No edema Pulses- Radial, DP- 2+ Skin- small ecchymosis on bilat arms        Assessment & Plan:

## 2012-07-20 NOTE — Assessment & Plan Note (Signed)
She has thin skin, hold blood thinner, plts, PT/INR all normal

## 2012-07-22 ENCOUNTER — Ambulatory Visit (HOSPITAL_COMMUNITY): Payer: Self-pay | Admitting: Psychiatry

## 2012-07-23 ENCOUNTER — Ambulatory Visit (INDEPENDENT_AMBULATORY_CARE_PROVIDER_SITE_OTHER): Payer: Medicare Other | Admitting: Psychiatry

## 2012-07-23 DIAGNOSIS — F319 Bipolar disorder, unspecified: Secondary | ICD-10-CM

## 2012-07-23 NOTE — Progress Notes (Signed)
Patient:  Tonya Sullivan   DOB: 12-11-1955  MR Number: [id number]  Location: [facility]:  [address] [place] [place],  [place], [address]  Start: Wednesday 07/23/2012 2:00 PM  Wednesday 07/23/2012 2:55 PM End:   Provider/Observer:     Florencia Reasons, MSW, LCSW   Chief Complaint:      Chief Complaint  Patient presents with  . Anxiety  . Depression    Reason For Service:     The patient was referred for services by psychiatrist Dr. Lolly Mustache due to patient experiencing anxiety, stress, depression, and mood swings. She has a long-standing history of depression beginning in adolescence. Patient also has significant trauma history. Patient also experienced grief and loss issues due to to the death of her husband in September 30, 2010. Patient is seen today for follow up appointment  Interventions Strategy:  Supportive therapy,  cognitive behavioral therapy  Participation Level:   Active  Participation Quality:  Appropriate      Behavioral Observation:  Well goomed, Alert, tearful  Current Psychosocial Factors: Patient reports her son and his girlfriend have begun the process of moving patient's belongings to their home.  Content of Session:   Reviewing symptoms, processing feelings, discussing possibility of including son and his girlfriend in patient's next session .  Current Status:   Patient reports increased anxiety and worry  Patient Progress:   Fair. The patient reports feeling overwhelmed as her son and his girlfriend have already begun moving her belongings to their home and continue to pressure patient to move in with them this month. Patient states being fearful of moving in with them as she will no longer have her own space. She says they do not have enough room in their home. She also states that she does not like being around anyone when she is in pain and does not want her granddaughter to see her in pain. Patient states now morning to live with her son and his girlfriend  this time. She has not expressed her concerns to her son and his girlfriend as she is afraid of being rejected and and does not want to hurt their feelings. Therapist and patient explore her options and discuss possibility of including son and his girlfriend in patient's next session to facilitate patient expressing her concerns. Patient plans to include son and girlfriend in the next session.    Target Goals:   1. Improving mood as evidenced by resuming normal interest in activities and increased social involvement. 2. Increase self acceptance and improve self-esteem as evidenced by patient making positive statements about self. 3.  Improve coping and relaxation techniques. 4. Process and resolve grief and loss issues.   Last Reviewed:   04/06/2011  Goals Addressed Today:    Goals 1 and 3  Impression/Diagnosis:   The patient presents with a long-standing history of recurrent depression, mood swings, and a significant trauma history.  Diagnosis: Major Depressive Disorder  Diagnosis:  Axis I:  Bipolar Disorder           Axis II: Deferred

## 2012-07-23 NOTE — Patient Instructions (Signed)
Discussed orally 

## 2012-07-24 ENCOUNTER — Ambulatory Visit (INDEPENDENT_AMBULATORY_CARE_PROVIDER_SITE_OTHER): Payer: Medicare Other | Admitting: Psychiatry

## 2012-07-24 ENCOUNTER — Encounter (HOSPITAL_COMMUNITY): Payer: Self-pay | Admitting: Psychiatry

## 2012-07-24 ENCOUNTER — Ambulatory Visit (HOSPITAL_COMMUNITY): Payer: Self-pay | Admitting: Psychiatry

## 2012-07-24 VITALS — Wt 198.0 lb

## 2012-07-24 DIAGNOSIS — R52 Pain, unspecified: Secondary | ICD-10-CM

## 2012-07-24 DIAGNOSIS — F319 Bipolar disorder, unspecified: Secondary | ICD-10-CM

## 2012-07-24 DIAGNOSIS — F329 Major depressive disorder, single episode, unspecified: Secondary | ICD-10-CM

## 2012-07-24 DIAGNOSIS — F3289 Other specified depressive episodes: Secondary | ICD-10-CM

## 2012-07-24 DIAGNOSIS — F411 Generalized anxiety disorder: Secondary | ICD-10-CM

## 2012-07-24 DIAGNOSIS — F419 Anxiety disorder, unspecified: Secondary | ICD-10-CM

## 2012-07-24 MED ORDER — BUSPIRONE HCL 15 MG PO TABS: 15.0000 mg | ORAL_TABLET | Freq: Three times a day (TID) | ORAL | Status: DC

## 2012-07-24 MED ORDER — DULOXETINE HCL 30 MG PO CPEP: 30.0000 mg | ORAL_CAPSULE | Freq: Every day | ORAL | Status: DC

## 2012-07-24 MED ORDER — AMITRIPTYLINE HCL 50 MG PO TABS: 50.0000 mg | ORAL_TABLET | Freq: Every day | ORAL | Status: DC

## 2012-07-24 MED ORDER — ZIPRASIDONE HCL 80 MG PO CAPS
80.0000 mg | ORAL_CAPSULE | Freq: Two times a day (BID) | ORAL | Status: DC
Start: 2012-07-24 — End: 2012-10-20

## 2012-07-24 NOTE — Progress Notes (Signed)
[facility] [address] Progress Note Tonya Sullivan MRN: [id number] DOB: Jun 20, 1955 Age: 57 y.o.  Date: 07/24/2012  Chief Complaint  Patient presents with  . Follow-up  . Medication Refill   History of present illness. Patient is a 57 year old Caucasian female who came for her followup appointment.  She has been very concerned about her physical health.  She seemed recently her primary care physician.  Her medications were adjusted.  She was given multiple psychotropic medication by the previous psychiatrist.  She was complaining of dizziness, restlessness, racing thoughts and balance in her walking.  Some of the medications were discontinued.  She was given pain medication by a neurologist.  She is still taking Lidoderm patches and pain medication.  She was prescribed Cymbalta 30 mg 3 times a day however she feels it is making her very nervous and anxious.  She is taking amitriptyline 100 mg every night and feels very sedated and groggy.  She denies any paranoia or any hallucinations but admitted racing thoughts mood swing and irritability.  She's also noticed fine tremors and shakes which could be due to serotonin over activity.  She is not drinking or using any illicit substances.  She has seen her endocrinologist who has been getting more blood work.  She has multiple doctor's appointment.  Past psychiatric history Patient endorse history of depression since age 69.  She has been at least 2 psychiatric admission due to suicidal thoughts.  She denies any history of suicidal attempt however endorse endorse history of paranoia and severe depression.  Her last psychiatric admission was January 2012 due to significant depression.  In the past she had tried gabapentin, Risperdal, trazodone, Abilify , Effexor, Valium, Lamictal, Klonopin and recently Wellbutrin.    Allergies: Allergies  Allergen Reactions  . Codeine Anaphylaxis  . Cyclobenzaprine Anaphylaxis  . Darvocet  (Propoxyphene-Acetaminophen) Anaphylaxis  . Abilify (Aripiprazole) Other (See Comments)    tremors  . Adhesive (Tape) Hives  . Dilaudid (Hydromorphone Hcl) Nausea And Vomiting    Only the pills.   . Iodine Hives  . Levofloxacin Hives  . Prednisone Hives  . Sulfa Antibiotics Itching   Medical History: Past Medical History  Diagnosis Date  . COPD (chronic obstructive pulmonary disease)   . Depression   . Chronic back pain     DDD, disc bulge, radiculopathy, spinal stenosis  . Hyperlipidemia   . Anxiety   . Bipolar disorder, unspecified   . Carotid artery calcification   . Obsessive-compulsive disorder   . DDD (degenerative disc disease), lumbosacral   . Spinal stenosis, lumbar   . Melanoma     removed 1 week ago at Dr Laurena Slimmer office  . Thyroid disease   . Autoimmune thyroiditis    Surgical History: Past Surgical History  Procedure Laterality Date  . Appendectomy    . Tonsillectomy    . Cholecystectomy    . Lump left breast      removed- benign  . Abdominal hysterectomy      tubal pregnancy  . Inguinal hernia repair    . Stomach surgery      ?holes in esophgous  . Ectopic pregnancy surgery    . Back surgery      x 5;1984;1989;;1999;2000;2010  . Colonoscopy  01/25/2012    Procedure: COLONOSCOPY;  Surgeon: Malissa Hippo, MD;  Location: [facility];  Service: Endoscopy;  Laterality: N/A;  130   Family History: family history includes ADD / ADHD in her brothers and sisters; Alcohol abuse in  her brothers, father, maternal grandfather, maternal grandmother, paternal grandfather, paternal grandmother, and sisters; Anxiety disorder in her maternal grandmother, mother, and sisters; Bipolar disorder in her brothers, maternal aunt, maternal grandfather, maternal grandmother, maternal uncles, paternal aunt, paternal grandfather, paternal grandmother, paternal uncle, and sisters; Cancer in her father; Dementia in her maternal grandmother; Drug abuse in her brothers, paternal  grandmother, and sister; Heart attack in her father and mother; Heart disease in her mother; Hypertension in her brother and mother; OCD in her brother; Paranoid behavior in her brother; Physical abuse in her brother and sisters; and Sexual abuse in her brother and sisters.  There is no history of Schizophrenia and Seizures. Reviewed again today during the visit  Mental status examination Patient is casually dressed and fairly groomed. She appears anxious restless and somewhat irritable.  She has fine tremors and shakes.  She is complaining of anxiety however it could be due to taking multiple antidepressants.  Her speech is fast but coherent.  She describes her mood is tired and irritable.  Her affect is mood appropriate.  She denies any active or passive suicidal thoughts and homicidal thoughts she denied any auditory or visual hallucination.  There were no delusion obsession present at this time.  She has difficulty concentrating.  Her attention concentration is low.  She is alert and oriented x3.  Her insight judgment and impulse control is okay.  Lab Results:  Results for orders placed in visit on 07/18/12 (from the past 8736 hour(s))  PT WITH INR/FINGERSTICK   Collection Time    07/18/12  3:43 PM      Result Value Range   PT, fingerstick 11.6  10.4 - 12.5 seconds   INR, fingerstick 1.0  0.80 - 1.20  Results for orders placed during the hospital encounter of 05/06/12 (from the past 8736 hour(s))  CBC WITH DIFFERENTIAL   Collection Time    05/06/12  4:16 PM      Result Value Range   WBC 8.3  4.0 - 10.5 K/uL   RBC 4.66  3.87 - 5.11 MIL/uL   Hemoglobin 14.6  12.0 - 15.0 g/dL   HCT 16.1  09.6 - 04.5 %   MCV 90.6  78.0 - 100.0 fL   MCH 31.3  26.0 - 34.0 pg   MCHC 34.6  30.0 - 36.0 g/dL   RDW 40.9  81.1 - 91.4 %   Platelets 261  150 - 400 K/uL   Neutrophils Relative % 56  43 - 77 %   Neutro Abs 4.6  1.7 - 7.7 K/uL   Lymphocytes Relative 34  12 - 46 %   Lymphs Abs 2.8  0.7 - 4.0 K/uL    Monocytes Relative 8  3 - 12 %   Monocytes Absolute 0.6  0.1 - 1.0 K/uL   Eosinophils Relative 3  0 - 5 %   Eosinophils Absolute 0.2  0.0 - 0.7 K/uL   Basophils Relative 0  0 - 1 %   Basophils Absolute 0.0  0.0 - 0.1 K/uL  COMPREHENSIVE METABOLIC PANEL   Collection Time    05/06/12  4:16 PM      Result Value Range   Sodium 135  135 - 145 mEq/L   Potassium 4.1  3.5 - 5.1 mEq/L   Chloride 97  96 - 112 mEq/L   CO2 29  19 - 32 mEq/L   Glucose, Bld 117 (*) 70 - 99 mg/dL   BUN 17  6 - 23 mg/dL  Creatinine, Ser 1.01  0.50 - 1.10 mg/dL   Calcium 9.2  8.4 - 16.1 mg/dL   Total Protein 6.9  6.0 - 8.3 g/dL   Albumin 3.6  3.5 - 5.2 g/dL   AST 25  0 - 37 U/L   ALT 24  0 - 35 U/L   Alkaline Phosphatase 57  39 - 117 U/L   Total Bilirubin 0.3  0.3 - 1.2 mg/dL   GFR calc non Af Amer 61 (*) >90 mL/min   GFR calc Af Amer 71 (*) >90 mL/min  TROPONIN I   Collection Time    05/06/12  4:16 PM      Result Value Range   Troponin I <0.30  <0.30 ng/mL  TSH   Collection Time    05/06/12  4:16 PM      Result Value Range   TSH 2.502  0.350 - 4.500 uIU/mL  Results for orders placed in visit on 05/01/12 (from the past 8736 hour(s))  VITAMIN B12   Collection Time    05/01/12 11:58 AM      Result Value Range   Vitamin B-12 335  211 - 911 pg/mL  VITAMIN D 1,25 DIHYDROXY   Collection Time    05/01/12 11:58 AM      Result Value Range   Vitamin D 1, 25 (OH) Total 34  18 - 72 pg/mL   Vitamin D3 1, 25 (OH) 34     Vitamin D2 1, 25 (OH) <8    LIPID PANEL   Collection Time    05/01/12 11:55 AM      Result Value Range   Cholesterol 134  0 - 200 mg/dL   Triglycerides 79  <096 mg/dL   HDL 58  >04 mg/dL   Total CHOL/HDL Ratio 2.3     VLDL 16  0 - 40 mg/dL   LDL Cholesterol 60  0 - 99 mg/dL  CBC   Collection Time    05/01/12 11:55 AM      Result Value Range   WBC 6.2  4.0 - 10.5 K/uL   RBC 4.52  3.87 - 5.11 MIL/uL   Hemoglobin 13.6  12.0 - 15.0 g/dL   HCT 54.0  98.1 - 19.1 %   MCV 88.5  78.0 -  100.0 fL   MCH 30.1  26.0 - 34.0 pg   MCHC 34.0  30.0 - 36.0 g/dL   RDW 47.8  29.5 - 62.1 %   Platelets 292  150 - 400 K/uL  COMPREHENSIVE METABOLIC PANEL   Collection Time    05/01/12 11:55 AM      Result Value Range   Sodium 140  135 - 145 mEq/L   Potassium 4.2  3.5 - 5.3 mEq/L   Chloride 105  96 - 112 mEq/L   CO2 25  19 - 32 mEq/L   Glucose, Bld 93  70 - 99 mg/dL   BUN 20  6 - 23 mg/dL   Creat 3.08  6.57 - 8.46 mg/dL   Total Bilirubin 0.2 (*) 0.3 - 1.2 mg/dL   Alkaline Phosphatase 55  39 - 117 U/L   AST 10  0 - 37 U/L   ALT 12  0 - 35 U/L   Total Protein 6.3  6.0 - 8.3 g/dL   Albumin 3.8  3.5 - 5.2 g/dL   Calcium 9.1  8.4 - 96.2 mg/dL  CK   Collection Time    05/01/12 11:55 AM      Result  Value Range   Total CK 44  7 - 177 U/L  TSH   Collection Time    05/01/12 11:55 AM      Result Value Range   TSH 1.066  0.350 - 4.500 uIU/mL  Results for orders placed in visit on 01/17/12 (from the past 8736 hour(s))  URINALYSIS   Collection Time    01/17/12 11:26 AM      Result Value Range   Color, Urine YELLOW  YELLOW   APPearance CLEAR  CLEAR   Specific Gravity, Urine 1.020  1.005 - 1.030   pH 5.0  5.0 - 8.0   Glucose, UA NEG  NEG mg/dL   Bilirubin Urine NEG  NEG   Ketones, ur NEG  NEG mg/dL   Hgb urine dipstick NEG  NEG   Protein, ur NEG  NEG mg/dL   Urobilinogen, UA 0.2  0.0 - 1.0 mg/dL   Nitrite NEG  NEG   Leukocytes, UA NEG  NEG  Results for orders placed during the hospital encounter of 12/24/11 (from the past 8736 hour(s))  COMPREHENSIVE METABOLIC PANEL   Collection Time    12/24/11 10:23 AM      Result Value Range   Sodium 138  135 - 145 mEq/L   Potassium 4.6  3.5 - 5.1 mEq/L   Chloride 102  96 - 112 mEq/L   CO2 28  19 - 32 mEq/L   Glucose, Bld 95  70 - 99 mg/dL   BUN 7  6 - 23 mg/dL   Creatinine, Ser 1.61  0.50 - 1.10 mg/dL   Calcium 9.3  8.4 - 09.6 mg/dL   Total Protein 7.4  6.0 - 8.3 g/dL   Albumin 3.8  3.5 - 5.2 g/dL   AST 18  0 - 37 U/L   ALT 10  0  - 35 U/L   Alkaline Phosphatase 66  39 - 117 U/L   Total Bilirubin 0.2 (*) 0.3 - 1.2 mg/dL   GFR calc non Af Amer 56 (*) >90 mL/min   GFR calc Af Amer 65 (*) >90 mL/min  LIPASE, BLOOD   Collection Time    12/24/11 10:23 AM      Result Value Range   Lipase 20  11 - 59 U/L  CBC WITH DIFFERENTIAL   Collection Time    12/24/11 10:23 AM      Result Value Range   WBC 6.0  4.0 - 10.5 K/uL   RBC 4.82  3.87 - 5.11 MIL/uL   Hemoglobin 15.0  12.0 - 15.0 g/dL   HCT 04.5  40.9 - 81.1 %   MCV 92.3  78.0 - 100.0 fL   MCH 31.1  26.0 - 34.0 pg   MCHC 33.7  30.0 - 36.0 g/dL   RDW 91.4  78.2 - 95.6 %   Platelets 262  150 - 400 K/uL   Neutrophils Relative % 56  43 - 77 %   Neutro Abs 3.4  1.7 - 7.7 K/uL   Lymphocytes Relative 36  12 - 46 %   Lymphs Abs 2.2  0.7 - 4.0 K/uL   Monocytes Relative 6  3 - 12 %   Monocytes Absolute 0.3  0.1 - 1.0 K/uL   Eosinophils Relative 2  0 - 5 %   Eosinophils Absolute 0.1  0.0 - 0.7 K/uL   Basophils Relative 0  0 - 1 %   Basophils Absolute 0.0  0.0 - 0.1 K/uL  URINALYSIS, ROUTINE W REFLEX MICROSCOPIC  Collection Time    12/24/11 10:42 AM      Result Value Range   Color, Urine YELLOW  YELLOW   APPearance CLEAR  CLEAR   Specific Gravity, Urine <1.005 (*) 1.005 - 1.030   pH 6.0  5.0 - 8.0   Glucose, UA NEGATIVE  NEGATIVE mg/dL   Hgb urine dipstick NEGATIVE  NEGATIVE   Bilirubin Urine NEGATIVE  NEGATIVE   Ketones, ur NEGATIVE  NEGATIVE mg/dL   Protein, ur NEGATIVE  NEGATIVE mg/dL   Urobilinogen, UA 0.2  0.0 - 1.0 mg/dL   Nitrite NEGATIVE  NEGATIVE   Leukocytes, UA NEGATIVE  NEGATIVE  POCT URINALYSIS DIPSTICK   Collection Time    12/24/11  3:23 PM      Result Value Range   Color, UA yellow     Clarity, UA clear     Glucose, UA neg     Bilirubin, UA neg     Ketones, UA neg     Spec Grav, UA <=1.005     Blood, UA neg     pH, UA 6.0     Protein, UA neg     Urobilinogen, UA 0.2     Nitrite, UA neg     Leukocytes, UA Negative    Results for orders  placed in visit on 12/14/11 (from the past 8736 hour(s))  COMPREHENSIVE METABOLIC PANEL   Collection Time    12/14/11  3:14 PM      Result Value Range   Sodium 143  135 - 145 mEq/L   Potassium 4.7  3.5 - 5.3 mEq/L   Chloride 102  96 - 112 mEq/L   CO2 33 (*) 19 - 32 mEq/L   Glucose, Bld 88  70 - 99 mg/dL   BUN 16  6 - 23 mg/dL   Creat 7.82  9.56 - 2.13 mg/dL   Total Bilirubin 0.1 (*) 0.3 - 1.2 mg/dL   Alkaline Phosphatase 69  39 - 117 U/L   AST 14  0 - 37 U/L   ALT 11  0 - 35 U/L   Total Protein 7.1  6.0 - 8.3 g/dL   Albumin 3.8  3.5 - 5.2 g/dL   Calcium 9.5  8.4 - 08.6 mg/dL  CBC WITH DIFFERENTIAL   Collection Time    12/14/11  3:14 PM      Result Value Range   WBC 6.8  4.0 - 10.5 K/uL   RBC 4.92  3.87 - 5.11 MIL/uL   Hemoglobin 15.3 (*) 12.0 - 15.0 g/dL   HCT 57.8 (*) 46.9 - 62.9 %   MCV 93.7  78.0 - 100.0 fL   MCH 31.1  26.0 - 34.0 pg   MCHC 33.2  30.0 - 36.0 g/dL   RDW 52.8  41.3 - 24.4 %   Platelets 271  150 - 400 K/uL   Neutrophils Relative % 52  43 - 77 %   Neutro Abs 3.5  1.7 - 7.7 K/uL   Lymphocytes Relative 37  12 - 46 %   Lymphs Abs 2.5  0.7 - 4.0 K/uL   Monocytes Relative 8  3 - 12 %   Monocytes Absolute 0.5  0.1 - 1.0 K/uL   Eosinophils Relative 3  0 - 5 %   Eosinophils Absolute 0.2  0.0 - 0.7 K/uL   Basophils Relative 1  0 - 1 %   Basophils Absolute 0.0  0.0 - 0.1 K/uL   Smear Review 0/24    LIPASE  Collection Time    12/14/11  3:14 PM      Result Value Range   Lipase 31  11 - 59 U/L  POCT URINALYSIS DIPSTICK   Collection Time    12/14/11  3:41 PM      Result Value Range   Color, UA yellow     Clarity, UA clear     Glucose, UA neg     Bilirubin, UA small     Ketones, UA trace     Spec Grav, UA 1.025     Blood, UA neg     pH, UA 6.5     Protein, UA neg     Urobilinogen, UA 0.2     Nitrite, UA neg     Leukocytes, UA Negative    PCP draws routine labs and nothing is emerging as of concern.  Diagnoses Axis I  Major depressive disorder, rule  out bipolar disorder  Axis II deferred Axis III see medical history Axis IV moderate Axis V 55-60  Plan: I reviewed her chart, past history, past medication , psychosocial stressors and current medication.  I do believe patient is taking multiple psychotropic medication which may be causing her more restless.  I recommend to decrease amitriptyline to 50 mg only at bedtime , reduce Cymbalta to 30 mg daily and reduce BuSpar 15 mg 3 times a day.  Recommend to continue Geodon 80 mg twice a day.  Continue to monitor closely for side effects of medication.  Explained if her balance tremors does not get better and call us back.  The patient is also awaiting the blood work results from her endocrinologist.  Discussed safety plan and that anytime having active suicidal thoughts or homicidal thoughts continue to call 911 with a local emergency room.  Followup in 4 weeks.  Time spent 30 minutes , more than 50% of the time spent and psychoeducation, constant and coordination of care.  MEDICATIONS this encounter: Meds ordered this encounter  Medications  . busPIRone (BUSPAR) 15 MG tablet    Sig: Take 1 tablet (15 mg total) by mouth 3 (three) times daily.    Dispense:  90 tablet    Refill:  0    See change in direction  . amitriptyline (ELAVIL) 50 MG tablet    Sig: Take 1 tablet (50 mg total) by mouth at bedtime.    Dispense:  30 tablet    Refill:  0  . DISCONTD: DULoxetine (CYMBALTA) 30 MG capsule    Sig: Take 1 capsule (30 mg total) by mouth daily. Take by mouth three a day.    Dispense:  30 capsule    Refill:  0  . ziprasidone (GEODON) 80 MG capsule    Sig: Take 1 capsule (80 mg total) by mouth 2 (two) times daily with a meal.    Dispense:  120 capsule    Refill:  0  . DULoxetine (CYMBALTA) 30 MG capsule    Sig: Take 1 capsule (30 mg total) by mouth daily.    Dispense:  30 capsule    Refill:  0    Medical Decision Making Problem Points:  Established problem, stable/improving (1), Established  problem, worsening (2), New problem, with no additional work-up planned (3), Review of last therapy session (1) and Review of psycho-social stressors (1) Data Points:  Review or order clinical lab tests (1) Review of medication regiment & side effects (2) Review of new medications or change in dosage (2)  Kayvan Hoefling T., MD

## 2012-07-28 ENCOUNTER — Telehealth: Payer: Self-pay | Admitting: Family Medicine

## 2012-07-28 NOTE — Telephone Encounter (Signed)
She is seeing a neurosurgeon, they deal with back pain, not rheumatology I would advise she see the surgeon as she was referred by Dr. Gerilyn Pilgrim

## 2012-07-28 NOTE — Telephone Encounter (Signed)
Pt does not see nuerosurgeon until Sept 11 want to see if we can get her in quicker with rheumatologist.

## 2012-07-28 NOTE — Telephone Encounter (Signed)
No please give message per before, rheumatology is not for neck or back pain, she needs to see the surgeon, she needs to talk to her pain doctor if her meds are not working

## 2012-07-29 ENCOUNTER — Ambulatory Visit (INDEPENDENT_AMBULATORY_CARE_PROVIDER_SITE_OTHER): Payer: Medicare Other | Admitting: Psychiatry

## 2012-07-29 DIAGNOSIS — F319 Bipolar disorder, unspecified: Secondary | ICD-10-CM

## 2012-07-29 NOTE — Telephone Encounter (Signed)
LMTRC

## 2012-07-30 NOTE — Telephone Encounter (Signed)
Pt aware of message

## 2012-07-31 NOTE — Patient Instructions (Signed)
Discussed orally 

## 2012-07-31 NOTE — Progress Notes (Signed)
Patient:  Tonya Sullivan   DOB: 1955-10-30  MR Number: [id number]  [facility]:  [address] [place] [place],  [place], [address]  Start: Tuesday 07/29/2012 4:00 PM  Tuesday 07/29/2012 4:30 PM End:   Provider/Observer:     Florencia Reasons, MSW, LCSW   Chief Complaint:      Chief Complaint  Patient presents with  . Anxiety  . Depression    Reason For Service:     The patient was referred for services by psychiatrist Dr. Lolly Mustache due to patient experiencing anxiety, stress, depression, and mood swings. She has a long-standing history of depression beginning in adolescence. Patient also has significant trauma history. Patient also experienced grief and loss issues due to to the death of her husband in 09-21-2010. Patient is seen today for follow up appointment  Interventions Strategy:  Supportive therapy,  cognitive behavioral therapy  Participation Level:   Active  Participation Quality:  Appropriate      Behavioral Observation:  Well goomed, Alert,   Current Psychosocial Factors: Patient reports continued pressure from son's girlfriend to move to their home.  Content of Session:   Reviewing symptoms, processing feelings, identifying ways to improve assertiveness skills, identifying coping statements  Current Status:   Patient reports continued anxiety and worry but improved mood.  Patient Progress:   Fair. The patient reports feeling controlled by her son's girlfriend who continues to pressure patient to move into their home. Patient decided against including son and his girlfriend in session as she still fears possible rejection and damage to the relationship. However, patient wants to delay the move. Therapist works with patient to process her feelings and to identify assertive statements patient can use to express her concerns to her son and his girlfriend. Therapist and patient also discussed patient's accomplishments and progress through previous adversities.  Therapist also works with patient to identify other members of patient's support system.   Target Goals:   1. Improving mood as evidenced by resuming normal interest in activities and increased social involvement. 2. Increase self acceptance and improve self-esteem as evidenced by patient making positive statements about self. 3.  Improve coping and relaxation techniques. 4. Process and resolve grief and loss issues.   Last Reviewed:   04/06/2011  Goals Addressed Today:    Goals 1 and 3  Impression/Diagnosis:   The patient presents with a long-standing history of recurrent depression, mood swings, and a significant trauma history.  Diagnosis: Major Depressive Disorder  Diagnosis:  Axis I:  Bipolar Disorder           Axis II: Deferred

## 2012-08-01 ENCOUNTER — Ambulatory Visit (HOSPITAL_COMMUNITY): Payer: Self-pay | Admitting: Psychiatry

## 2012-08-02 ENCOUNTER — Emergency Department (HOSPITAL_BASED_OUTPATIENT_CLINIC_OR_DEPARTMENT_OTHER): Payer: Medicare Other

## 2012-08-02 ENCOUNTER — Encounter (HOSPITAL_BASED_OUTPATIENT_CLINIC_OR_DEPARTMENT_OTHER): Payer: Self-pay | Admitting: *Deleted

## 2012-08-02 ENCOUNTER — Inpatient Hospital Stay (HOSPITAL_BASED_OUTPATIENT_CLINIC_OR_DEPARTMENT_OTHER)
Admission: EM | Admit: 2012-08-02 | Discharge: 2012-08-05 | DRG: 178 | Disposition: A | Discharge status: Home / Self Care | Payer: Medicare Other | Attending: Internal Medicine | Admitting: Internal Medicine

## 2012-08-02 DIAGNOSIS — Z720 Tobacco use: Secondary | ICD-10-CM

## 2012-08-02 DIAGNOSIS — M542 Cervicalgia: Secondary | ICD-10-CM | POA: Diagnosis present

## 2012-08-02 DIAGNOSIS — E039 Hypothyroidism, unspecified: Secondary | ICD-10-CM | POA: Diagnosis present

## 2012-08-02 DIAGNOSIS — R1319 Other dysphagia: Secondary | ICD-10-CM | POA: Diagnosis present

## 2012-08-02 DIAGNOSIS — G459 Transient cerebral ischemic attack, unspecified: Secondary | ICD-10-CM

## 2012-08-02 DIAGNOSIS — Z23 Encounter for immunization: Secondary | ICD-10-CM

## 2012-08-02 DIAGNOSIS — J449 Chronic obstructive pulmonary disease, unspecified: Secondary | ICD-10-CM

## 2012-08-02 DIAGNOSIS — J690 Pneumonitis due to inhalation of food and vomit: Principal | ICD-10-CM

## 2012-08-02 DIAGNOSIS — R079 Chest pain, unspecified: Secondary | ICD-10-CM

## 2012-08-02 DIAGNOSIS — F429 Obsessive-compulsive disorder, unspecified: Secondary | ICD-10-CM | POA: Diagnosis present

## 2012-08-02 DIAGNOSIS — J189 Pneumonia, unspecified organism: Secondary | ICD-10-CM

## 2012-08-02 DIAGNOSIS — K224 Dyskinesia of esophagus: Secondary | ICD-10-CM | POA: Diagnosis present

## 2012-08-02 DIAGNOSIS — N179 Acute kidney failure, unspecified: Secondary | ICD-10-CM | POA: Diagnosis present

## 2012-08-02 DIAGNOSIS — R1310 Dysphagia, unspecified: Secondary | ICD-10-CM

## 2012-08-02 DIAGNOSIS — J4489 Other specified chronic obstructive pulmonary disease: Secondary | ICD-10-CM | POA: Diagnosis present

## 2012-08-02 DIAGNOSIS — K222 Esophageal obstruction: Secondary | ICD-10-CM

## 2012-08-02 DIAGNOSIS — E785 Hyperlipidemia, unspecified: Secondary | ICD-10-CM

## 2012-08-02 DIAGNOSIS — M48061 Spinal stenosis, lumbar region without neurogenic claudication: Secondary | ICD-10-CM | POA: Diagnosis present

## 2012-08-02 DIAGNOSIS — F411 Generalized anxiety disorder: Secondary | ICD-10-CM | POA: Diagnosis present

## 2012-08-02 DIAGNOSIS — L03116 Cellulitis of left lower limb: Secondary | ICD-10-CM

## 2012-08-02 DIAGNOSIS — E860 Dehydration: Secondary | ICD-10-CM | POA: Diagnosis present

## 2012-08-02 DIAGNOSIS — F17200 Nicotine dependence, unspecified, uncomplicated: Secondary | ICD-10-CM | POA: Diagnosis present

## 2012-08-02 DIAGNOSIS — F319 Bipolar disorder, unspecified: Secondary | ICD-10-CM

## 2012-08-02 DIAGNOSIS — R471 Dysarthria and anarthria: Secondary | ICD-10-CM | POA: Diagnosis present

## 2012-08-02 DIAGNOSIS — G8929 Other chronic pain: Secondary | ICD-10-CM | POA: Diagnosis present

## 2012-08-02 DIAGNOSIS — I10 Essential (primary) hypertension: Secondary | ICD-10-CM

## 2012-08-02 LAB — BASIC METABOLIC PANEL
BUN: 38 mg/dL — ABNORMAL HIGH (ref 6–23)
CO2: 27 mEq/L (ref 19–32)
Chloride: 95 mEq/L — ABNORMAL LOW (ref 96–112)
Creatinine, Ser: 1.9 mg/dL — ABNORMAL HIGH (ref 0.50–1.10)
GFR calc Af Amer: 33 mL/min — ABNORMAL LOW (ref >90)

## 2012-08-02 LAB — CBC WITH DIFFERENTIAL/PLATELET
Basophils Absolute: 0 10*3/uL (ref 0.0–0.1)
Basophils Relative: 0 % (ref 0–1)
Eosinophils Absolute: 0.1 10*3/uL (ref 0.0–0.7)
Eosinophils Relative: 1 % (ref 0–5)
HCT: 39.6 % (ref 36.0–46.0)
MCHC: 33.6 g/dL (ref 30.0–36.0)
MCV: 94.5 fL (ref 78.0–100.0)
Monocytes Absolute: 0.8 10*3/uL (ref 0.1–1.0)
RDW: 12.9 % (ref 11.5–15.5)

## 2012-08-02 MED ORDER — DEXTROSE 5 % IV SOLN
1.0000 g | Freq: Once | INTRAVENOUS | Status: AC
  Administered 2012-08-02: 1 g via INTRAVENOUS
  Filled 2012-08-02: qty 10

## 2012-08-02 MED ORDER — SODIUM CHLORIDE 0.9 % IV BOLUS (SEPSIS)
1000.0000 mL | Freq: Once | INTRAVENOUS | Status: AC
  Administered 2012-08-02: 1000 mL via INTRAVENOUS

## 2012-08-02 MED ORDER — FENTANYL CITRATE 0.05 MG/ML IJ SOLN
50.0000 ug | Freq: Once | INTRAMUSCULAR | Status: AC
  Administered 2012-08-02: 50 ug via INTRAVENOUS
  Filled 2012-08-02: qty 2

## 2012-08-02 MED ORDER — AZITHROMYCIN 250 MG PO TABS
ORAL_TABLET | ORAL | Status: AC
  Administered 2012-08-02: 500 mg via ORAL
  Filled 2012-08-02: qty 2

## 2012-08-02 MED ORDER — ASPIRIN 81 MG PO CHEW
324.0000 mg | CHEWABLE_TABLET | Freq: Once | ORAL | Status: AC
  Administered 2012-08-02: 324 mg via ORAL
  Filled 2012-08-02: qty 4

## 2012-08-02 MED ORDER — AZITHROMYCIN 500 MG PO TABS
500.0000 mg | ORAL_TABLET | Freq: Every day | ORAL | Status: DC
  Administered 2012-08-03: 500 mg via ORAL
  Filled 2012-08-02: qty 1

## 2012-08-02 NOTE — ED Notes (Signed)
Per daughter, patient was confused when she woke up this morning and then went back to bed for about an hour, but has remained confused throughout the day. C/O bilateral arm numbness, chest pressure since early this morning.

## 2012-08-02 NOTE — ED Notes (Signed)
Returned from [employer]. B/P 85/50. Denies any cp. C/o posterior neck pain. Still rates 4/10. Sinus on monitor. 2nd fluid bolus in progress.

## 2012-08-02 NOTE — ED Notes (Signed)
Assigned to bed 3312 @ [facility], RN notified.

## 2012-08-02 NOTE — ED Notes (Signed)
Report called to Chrissie Noa, RN  at [facility]

## 2012-08-02 NOTE — ED Provider Notes (Addendum)
CSN: [id number]     Arrival date & time 08/02/12  1845 History     First MD Initiated Contact with Patient 08/02/12 1854    Level V caveat unstable vital signs Chief Complaint  Patient presents with  . Numbness  . Altered Mental Status   (Consider location/radiation/quality/duration/timing/severity/associated sxs/prior Treatment) HPI Patient's daughter-in-law reports that she awakened this morning with slurred speech and confusion. Symptoms lasted for approximately one hour, confusion resolved spontaneously. Presently patient complains of bilateral arm pain and anterior chest pain which is worse with sitting up and improved with lying supine. No other associated symptoms. No treatment prior to coming here. She was evaluated at an urgent care center advised to come here for further workup. Presently pain is 7 on a scale of 1-10. Her daughter reports that her speech is almost back to normal presently, it sounds presently as if "she is tired" Past Medical History  Diagnosis Date  . COPD (chronic obstructive pulmonary disease)   . Depression   . Chronic back pain     DDD, disc bulge, radiculopathy, spinal stenosis  . Hyperlipidemia   . Anxiety   . Bipolar disorder, unspecified   . Carotid artery calcification   . Obsessive-compulsive disorder   . DDD (degenerative disc disease), lumbosacral   . Spinal stenosis, lumbar   . Melanoma     removed 1 week ago at Dr Laurena Slimmer office  . Thyroid disease   . Autoimmune thyroiditis    Past Surgical History  Procedure Laterality Date  . Appendectomy    . Tonsillectomy    . Cholecystectomy    . Lump left breast      removed- benign  . Abdominal hysterectomy      tubal pregnancy  . Inguinal hernia repair    . Stomach surgery      ?holes in esophgous  . Ectopic pregnancy surgery    . Back surgery      x 5;1984;1989;;1999;2000;2010  . Colonoscopy  01/25/2012    Procedure: COLONOSCOPY;  Surgeon: Malissa Hippo, MD;  Location: [facility];   Service: Endoscopy;  Laterality: N/A;  130   Family History  Problem Relation Age of Onset  . Hypertension Mother   . Heart disease Mother   . Heart attack Mother   . Anxiety disorder Mother   . Cancer Father     lung cancer  . Heart attack Father   . Alcohol abuse Father   . Hypertension Brother   . Alcohol abuse Brother   . Bipolar disorder Brother   . ADD / ADHD Brother   . Drug abuse Brother   . OCD Brother   . Alcohol abuse Sister   . Bipolar disorder Sister   . Anxiety disorder Sister   . ADD / ADHD Sister   . Drug abuse Sister   . Physical abuse Sister   . Sexual abuse Sister   . Alcohol abuse Brother   . Bipolar disorder Brother   . ADD / ADHD Brother   . Drug abuse Brother   . Alcohol abuse Sister   . Bipolar disorder Sister   . Anxiety disorder Sister   . ADD / ADHD Sister   . Physical abuse Sister   . Sexual abuse Sister   . Alcohol abuse Brother   . Bipolar disorder Brother   . ADD / ADHD Brother   . Bipolar disorder Brother   . ADD / ADHD Brother   . Alcohol abuse Brother   .  Paranoid behavior Brother   . Physical abuse Brother   . Sexual abuse Brother   . Bipolar disorder Maternal Aunt   . Bipolar disorder Paternal Aunt   . Bipolar disorder Maternal Uncle   . Bipolar disorder Paternal Uncle   . Bipolar disorder Maternal Grandfather   . Alcohol abuse Maternal Grandfather   . Bipolar disorder Maternal Grandmother   . Alcohol abuse Maternal Grandmother   . Anxiety disorder Maternal Grandmother   . Dementia Maternal Grandmother   . Bipolar disorder Paternal Grandfather   . Alcohol abuse Paternal Grandfather   . Bipolar disorder Paternal Grandmother   . Alcohol abuse Paternal Grandmother   . Drug abuse Paternal Grandmother   . Bipolar disorder Maternal Uncle   . Schizophrenia Neg Hx   . Seizures Neg Hx    History  Substance Use Topics  . Smoking status: Current Every Day Smoker -- 1.00 packs/day for 40 years    Types: Cigarettes  . Smokeless  tobacco: Never Used     Comment: 6 or 7 cigs a day as of 05/21/2012  . Alcohol Use: No   OB History   Grav Para Term Preterm Abortions TAB SAB Ect Mult Living   2 1 1  1   1  1      Review of Systems  Unable to perform ROS: Unstable vital signs  Cardiovascular: Positive for chest pain.  Musculoskeletal: Positive for myalgias.  Neurological: Positive for speech difficulty.    Allergies  Codeine; Cyclobenzaprine; Darvocet; Abilify; Adhesive; Dilaudid; Iodine; Levofloxacin; Prednisone; and Sulfa antibiotics  Home Medications   Current Outpatient Rx  Name  Route  Sig  Dispense  Refill  . albuterol (PROVENTIL HFA) 108 (90 BASE) MCG/ACT inhaler   Inhalation   Inhale 2 puffs into the lungs every 6 (six) hours as needed for wheezing or shortness of breath.         Marland Kitchen albuterol (PROVENTIL) (2.5 MG/3ML) 0.083% nebulizer solution   Nebulization   Take 3 mLs (2.5 mg total) by nebulization every 4 (four) hours as needed for wheezing.   75 mL   3   . amitriptyline (ELAVIL) 50 MG tablet   Oral   Take 1 tablet (50 mg total) by mouth at bedtime.   30 tablet   0   . aspirin EC 81 MG tablet   Oral   Take 162 mg by mouth daily.         . busPIRone (BUSPAR) 15 MG tablet   Oral   Take 1 tablet (15 mg total) by mouth 3 (three) times daily.   90 tablet   0     See change in direction   . cyanocobalamin 500 MCG tablet   Oral   Take 500 mcg by mouth daily.         . DULoxetine (CYMBALTA) 30 MG capsule   Oral   Take 1 capsule (30 mg total) by mouth daily.   30 capsule   0   . fish oil-omega-3 fatty acids 1000 MG capsule   Oral   Take 1 g by mouth every morning.          . hydrochlorothiazide (HYDRODIURIL) 25 MG tablet   Oral   Take 1 tablet (25 mg total) by mouth daily. For blood pressure and swelling   30 tablet   1   . lidocaine (LIDODERM) 5 %   Transdermal   Place 1 patch onto the skin every 12 (twelve) hours. Remove & Discard patch  within 12 hours or as directed  by MD   30 patch   2   . losartan (COZAAR) 50 MG tablet   Oral   Take 1 tablet (50 mg total) by mouth daily.   90 tablet   3   . oxyCODONE-acetaminophen (PERCOCET) 10-325 MG per tablet   Oral   Take 1 tablet by mouth every 6 (six) hours as needed. For pain         . pravastatin (PRAVACHOL) 40 MG tablet      TAKE 1 TABLET BY MOUTH EVERY NIGHT AT BEDTIME   90 tablet   0   . propranolol (INDERAL) 10 MG tablet   Oral   Take 2 tablets (20 mg total) by mouth 3 (three) times daily.   270 tablet   1   . PROVENTIL HFA 108 (90 BASE) MCG/ACT inhaler      INHALE 2 PUFFS BY MOUTH EVERY 4 HOURS AS NEEDED FOR SHORTNESS OF BREATH   6.7 g   11   . tiotropium (SPIRIVA HANDIHALER) 18 MCG inhalation capsule   Inhalation   Place 1 capsule (18 mcg total) into inhaler and inhale daily.   30 capsule   6   . triamcinolone (KENALOG) 0.1 % paste      1 application daily.         . ziprasidone (GEODON) 80 MG capsule   Oral   Take 1 capsule (80 mg total) by mouth 2 (two) times daily with a meal.   120 capsule   0    BP 88/46  Temp(Src) 98.3 F (36.8 C) Physical Exam  Nursing note and vitals reviewed. Constitutional: She is oriented to person, place, and time. She appears well-developed and well-nourished. No distress.  Hypotensive  HENT:  Head: Normocephalic and atraumatic.  Eyes: Conjunctivae are normal. Pupils are equal, round, and reactive to light.  Neck: Neck supple. No tracheal deviation present. No thyromegaly present.  Cardiovascular: Normal rate and regular rhythm.   No murmur heard. Pulmonary/Chest: Effort normal and breath sounds normal.  Abdominal: Soft. Bowel sounds are normal. She exhibits no distension. There is no tenderness.  Musculoskeletal: Normal range of motion. She exhibits no edema and no tenderness.  Neurological: She is alert and oriented to person, place, and time. She has normal reflexes. No cranial nerve deficit. Coordination normal.   Finger-to-nose normal heel-to-shin normal  Skin: Skin is warm and dry. No rash noted.  Psychiatric: She has a normal mood and affect.    ED Course   Procedures (including critical care time)  Labs Reviewed  TROPONIN I  BASIC METABOLIC PANEL  CBC WITH DIFFERENTIAL   No results found. No diagnosis found.  Date: 08/02/2012  Rate: 80  Rhythm: normal sinus rhythm  QRS Axis: normal  Intervals: normal  ST/T Wave abnormalities: nonspecific T wave changes  Conduction Disutrbances:none  Narrative Interpretation:   Old EKG Reviewed: No significant change from 05/13/2012 interpreted by me Results for orders placed during the hospital encounter of 08/02/12  TROPONIN I      Result Value Range   Troponin I <0.30  <0.30 ng/mL  BASIC METABOLIC PANEL      Result Value Range   Sodium 133 (*) 135 - 145 mEq/L   Potassium 3.9  3.5 - 5.1 mEq/L   Chloride 95 (*) 96 - 112 mEq/L   CO2 27  19 - 32 mEq/L   Glucose, Bld 104 (*) 70 - 99 mg/dL   BUN 38 (*) 6 -  23 mg/dL   Creatinine, Ser 1.61 (*) 0.50 - 1.10 mg/dL   Calcium 09.6  8.4 - 04.5 mg/dL   GFR calc non Af Amer 28 (*) >90 mL/min   GFR calc Af Amer 33 (*) >90 mL/min  CBC WITH DIFFERENTIAL      Result Value Range   WBC 15.1 (*) 4.0 - 10.5 K/uL   RBC 4.19  3.87 - 5.11 MIL/uL   Hemoglobin 13.3  12.0 - 15.0 g/dL   HCT 40.9  81.1 - 91.4 %   MCV 94.5  78.0 - 100.0 fL   MCH 31.7  26.0 - 34.0 pg   MCHC 33.6  30.0 - 36.0 g/dL   RDW 78.2  95.6 - 21.3 %   Platelets 259  150 - 400 K/uL   Neutrophils Relative % 84 (*) 43 - 77 %   Neutro Abs 12.7 (*) 1.7 - 7.7 K/uL   Lymphocytes Relative 10 (*) 12 - 46 %   Lymphs Abs 1.5  0.7 - 4.0 K/uL   Monocytes Relative 5  3 - 12 %   Monocytes Absolute 0.8  0.1 - 1.0 K/uL   Eosinophils Relative 1  0 - 5 %   Eosinophils Absolute 0.1  0.0 - 0.7 K/uL   Basophils Relative 0  0 - 1 %   Basophils Absolute 0.0  0.0 - 0.1 K/uL  GLUCOSE, CAPILLARY      Result Value Range   Glucose-Capillary 123 (*) 70 - 99  mg/dL  D-DIMER, QUANTITATIVE      Result Value Range   D-Dimer, Quant 0.56 (*) 0.00 - 0.48 ug/mL-FEU  CG4 I-STAT (LACTIC ACID)      Result Value Range   Lactic Acid, Venous 0.57  0.5 - 2.2 mmol/L   Dg Chest 2 View  08/02/2012   *RADIOLOGY REPORT*  Clinical Data: Chest pain  CHEST - 2 VIEW  Comparison: 02/25/2011  Findings: The heart and pulmonary vascularity are within normal limits.  Some mild increased density is noted in the right lung base consistent with early infiltrate. No bony abnormality is noted.  IMPRESSION: Early right basilar infiltrate.   Original Report Authenticated By: Alcide Clever, M.D.   Ct Head Wo Contrast  08/02/2012   *RADIOLOGY REPORT*  Clinical Data: 57 year old female with slurred speech, confusion and bilateral arm numbness.  CT HEAD WITHOUT CONTRAST  Technique:  Contiguous axial images were obtained from the base of the skull through the vertex without contrast.  Comparison: 07/28/2009 head CT  Findings: No intracranial abnormalities are identified, including mass lesion or mass effect, hydrocephalus, extra-axial fluid collection, midline shift, hemorrhage, or acute infarction.  The visualized bony calvarium is unremarkable.  IMPRESSION: Unremarkable noncontrast head CT.   Original Report Authenticated By: Harmon Pier, M.D.   Ct Chest Wo Contrast  08/02/2012   *RADIOLOGY REPORT*  Clinical Data: 58 year old female with chest pain and bilateral arm numbness.  CT CHEST WITHOUT CONTRAST  Technique:  Multidetector CT imaging of the chest was performed following the standard protocol without IV contrast, which was not administered secondary to patient's renal insufficiency.  Comparison: 03/12/2010 chest CT  Findings: Mild to moderate coronary artery calcifications again noted. The heart and great vessels are unchanged and within normal limits otherwise. There is no evidence of thoracic aortic aneurysm.  No pleural or pericardial effusions are identified. There are no enlarged lymph  nodes.  Tree in bud opacities throughout the right lung are compatible with infection. The left lung is clear. There is no  evidence of consolidation or pulmonary mass.  No acute or suspicious bony abnormalities are identified. Punctate nonobstructing left upper pole renal calculi are identified. The patient is status post cholecystectomy.  IMPRESSION: Tree in bud opacities throughout the right lung compatible with infection.  Coronary artery disease.  Punctate nonobstructing left upper pole renal calculi.   Original Report Authenticated By: Harmon Pier, M.D.    2 2:30 PM requesting more pain medicine additional fentanyl ordered At 20 2:50 PM pain is improved. Patient history resting comfortably after treatment with intravenous opioids, intravenous fluids and intravenous antibiotics. MDM  CT angio of chest was not ordered in light of elevated renal function and decreased GFR. It was suggested by the radiologist after discussion to obtain CT scan without contrast, which shows airspace disease. In light of hypotension, leukocytosis, airspace disease seen on plain film and CT scan, and patient giving subsequent history of wheeze, treat with antibiotics felt to be warranted. Spoke with Dr. Allena Katz who will reorder lab work to include renal function upon arrival to [facility]. If renal function permits patient may need further diagnostic testing torule out pulmonary embolism, Plan transfer [place] hospital. Step down unit Diagnosis #1 dysarthria-resolved #2 chest pain #3 leukocytosis #4 hypertension #5renal insufficiency CRITICAL CARE Performed by: Doug Sou Total critical care time: 30 minute Critical care time was exclusive of separately billable procedures and treating other patients. Critical care was necessary to treat or prevent imminent or life-threatening deterioration. Critical care was time spent personally by me on the following activities: development of treatment plan with  patient and/or surrogate as well as nursing, discussions with consultants, evaluation of patient's response to treatment, examination of patient, obtaining history from patient or surrogate, ordering and performing treatments and interventions, ordering and review of laboratory studies, ordering and review of radiographic studies, pulse oximetry and re-evaluation of patient's condition. Doug Sou, MD   Prior hospital records and lab work reviewed  08/02/12 2307  Doug Sou, MD 08/02/12 4098  Doug Sou, MD 08/02/12 2308

## 2012-08-02 NOTE — ED Notes (Signed)
MD with pt  

## 2012-08-02 NOTE — ED Notes (Signed)
MD aware of b/p 91/48.

## 2012-08-02 NOTE — ED Notes (Signed)
Family brought pt. [employer] to eat. Instructed pt not to eat until test have been completed and MD states she is able to eat.

## 2012-08-02 NOTE — ED Notes (Signed)
Pt sent here by Urgent Care- pt's daughter reports pt confused today- c/o bilateral arm numbness

## 2012-08-02 NOTE — ED Notes (Signed)
Pt wanting to eat her food. MD aware and states pt needs to remain npo

## 2012-08-02 NOTE — ED Notes (Signed)
Patient transported to X-ray 

## 2012-08-03 ENCOUNTER — Inpatient Hospital Stay (HOSPITAL_COMMUNITY): Payer: Medicare Other

## 2012-08-03 ENCOUNTER — Encounter (HOSPITAL_COMMUNITY): Payer: Self-pay | Admitting: *Deleted

## 2012-08-03 DIAGNOSIS — L02419 Cutaneous abscess of limb, unspecified: Secondary | ICD-10-CM

## 2012-08-03 DIAGNOSIS — I10 Essential (primary) hypertension: Secondary | ICD-10-CM

## 2012-08-03 DIAGNOSIS — J449 Chronic obstructive pulmonary disease, unspecified: Secondary | ICD-10-CM

## 2012-08-03 DIAGNOSIS — J690 Pneumonitis due to inhalation of food and vomit: Secondary | ICD-10-CM | POA: Diagnosis present

## 2012-08-03 DIAGNOSIS — R079 Chest pain, unspecified: Secondary | ICD-10-CM

## 2012-08-03 DIAGNOSIS — L03116 Cellulitis of left lower limb: Secondary | ICD-10-CM | POA: Insufficient documentation

## 2012-08-03 DIAGNOSIS — F319 Bipolar disorder, unspecified: Secondary | ICD-10-CM

## 2012-08-03 DIAGNOSIS — R471 Dysarthria and anarthria: Secondary | ICD-10-CM | POA: Diagnosis present

## 2012-08-03 DIAGNOSIS — J189 Pneumonia, unspecified organism: Secondary | ICD-10-CM

## 2012-08-03 DIAGNOSIS — K222 Esophageal obstruction: Secondary | ICD-10-CM | POA: Diagnosis present

## 2012-08-03 DIAGNOSIS — G459 Transient cerebral ischemic attack, unspecified: Secondary | ICD-10-CM | POA: Diagnosis present

## 2012-08-03 LAB — GLUCOSE, CAPILLARY: Glucose-Capillary: 117 mg/dL — ABNORMAL HIGH (ref 70–99)

## 2012-08-03 LAB — TROPONIN I: Troponin I: 0.32 ng/mL (ref <0.30)

## 2012-08-03 LAB — COMPREHENSIVE METABOLIC PANEL
ALT: 12 U/L (ref 0–35)
Albumin: 2.9 g/dL — ABNORMAL LOW (ref 3.5–5.2)
Alkaline Phosphatase: 47 U/L (ref 39–117)
BUN: 27 mg/dL — ABNORMAL HIGH (ref 6–23)
Chloride: 102 mEq/L (ref 96–112)
Glucose, Bld: 89 mg/dL (ref 70–99)
Potassium: 3.6 mEq/L (ref 3.5–5.1)
Sodium: 135 mEq/L (ref 135–145)
Total Bilirubin: 0.2 mg/dL — ABNORMAL LOW (ref 0.3–1.2)
Total Protein: 6.2 g/dL (ref 6.0–8.3)

## 2012-08-03 LAB — STREP PNEUMONIAE URINARY ANTIGEN: Strep Pneumo Urinary Antigen: NEGATIVE

## 2012-08-03 LAB — CBC WITH DIFFERENTIAL/PLATELET
Basophils Absolute: 0 10*3/uL (ref 0.0–0.1)
Eosinophils Relative: 2 % (ref 0–5)
HCT: 35.2 % — ABNORMAL LOW (ref 36.0–46.0)
Hemoglobin: 12 g/dL (ref 12.0–15.0)
Lymphocytes Relative: 15 % (ref 12–46)
Lymphs Abs: 1.7 10*3/uL (ref 0.7–4.0)
MCV: 93.1 fL (ref 78.0–100.0)
Monocytes Absolute: 0.6 10*3/uL (ref 0.1–1.0)
Monocytes Relative: 6 % (ref 3–12)
Neutro Abs: 9.2 10*3/uL — ABNORMAL HIGH (ref 1.7–7.7)
RBC: 3.78 MIL/uL — ABNORMAL LOW (ref 3.87–5.11)
WBC: 11.7 10*3/uL — ABNORMAL HIGH (ref 4.0–10.5)

## 2012-08-03 LAB — PRO B NATRIURETIC PEPTIDE: Pro B Natriuretic peptide (BNP): 745.1 pg/mL — ABNORMAL HIGH (ref 0–125)

## 2012-08-03 MED ORDER — ZIPRASIDONE HCL 80 MG PO CAPS
80.0000 mg | ORAL_CAPSULE | Freq: Two times a day (BID) | ORAL | Status: DC
  Administered 2012-08-03 – 2012-08-05 (×5): 80 mg via ORAL
  Filled 2012-08-03 (×7): qty 1

## 2012-08-03 MED ORDER — OXYCODONE-ACETAMINOPHEN 10-325 MG PO TABS: 1.0000 | ORAL_TABLET | Freq: Four times a day (QID) | ORAL | Status: DC | PRN

## 2012-08-03 MED ORDER — TIOTROPIUM BROMIDE MONOHYDRATE 18 MCG IN CAPS
18.0000 ug | ORAL_CAPSULE | Freq: Every day | RESPIRATORY_TRACT | Status: DC
  Administered 2012-08-03 – 2012-08-05 (×3): 18 ug via RESPIRATORY_TRACT
  Filled 2012-08-03: qty 5

## 2012-08-03 MED ORDER — OXYCODONE-ACETAMINOPHEN 5-325 MG PO TABS
1.0000 | ORAL_TABLET | Freq: Four times a day (QID) | ORAL | Status: DC | PRN
  Administered 2012-08-03 – 2012-08-05 (×9): 1 via ORAL
  Filled 2012-08-03 (×9): qty 1

## 2012-08-03 MED ORDER — AMPICILLIN-SULBACTAM SODIUM 3 (2-1) G IJ SOLR
3.0000 g | Freq: Three times a day (TID) | INTRAMUSCULAR | Status: DC
  Administered 2012-08-03 – 2012-08-05 (×7): 3 g via INTRAVENOUS
  Filled 2012-08-03 (×11): qty 3

## 2012-08-03 MED ORDER — SIMVASTATIN 20 MG PO TABS
20.0000 mg | ORAL_TABLET | Freq: Every day | ORAL | Status: DC
  Administered 2012-08-03 – 2012-08-04 (×2): 20 mg via ORAL
  Filled 2012-08-03 (×3): qty 1

## 2012-08-03 MED ORDER — LIDOCAINE 5 % EX PTCH
1.0000 | MEDICATED_PATCH | CUTANEOUS | Status: DC
  Administered 2012-08-03 – 2012-08-05 (×3): 1 via TRANSDERMAL
  Filled 2012-08-03 (×3): qty 1

## 2012-08-03 MED ORDER — IPRATROPIUM BROMIDE 0.02 % IN SOLN: 0.5000 mg | RESPIRATORY_TRACT | Status: DC | PRN

## 2012-08-03 MED ORDER — GUAIFENESIN ER 600 MG PO TB12
600.0000 mg | ORAL_TABLET | Freq: Two times a day (BID) | ORAL | Status: DC
  Administered 2012-08-03 – 2012-08-05 (×6): 600 mg via ORAL
  Filled 2012-08-03 (×7): qty 1

## 2012-08-03 MED ORDER — BUSPIRONE HCL 15 MG PO TABS
15.0000 mg | ORAL_TABLET | Freq: Three times a day (TID) | ORAL | Status: DC
  Administered 2012-08-03 – 2012-08-05 (×7): 15 mg via ORAL
  Filled 2012-08-03 (×10): qty 1

## 2012-08-03 MED ORDER — ALBUTEROL SULFATE HFA 108 (90 BASE) MCG/ACT IN AERS: 2.0000 | INHALATION_SPRAY | RESPIRATORY_TRACT | Status: DC | PRN

## 2012-08-03 MED ORDER — ALBUTEROL SULFATE (5 MG/ML) 0.5% IN NEBU
2.5000 mg | INHALATION_SOLUTION | RESPIRATORY_TRACT | Status: DC
  Administered 2012-08-03: 2.5 mg via RESPIRATORY_TRACT
  Filled 2012-08-03: qty 0.5

## 2012-08-03 MED ORDER — ACETAMINOPHEN 325 MG PO TABS: 650.0000 mg | ORAL_TABLET | Freq: Four times a day (QID) | ORAL | Status: DC | PRN

## 2012-08-03 MED ORDER — DULOXETINE HCL 30 MG PO CPEP
30.0000 mg | ORAL_CAPSULE | Freq: Every day | ORAL | Status: DC
  Administered 2012-08-03 – 2012-08-05 (×3): 30 mg via ORAL
  Filled 2012-08-03 (×3): qty 1

## 2012-08-03 MED ORDER — ASPIRIN EC 325 MG PO TBEC
325.0000 mg | DELAYED_RELEASE_TABLET | Freq: Every day | ORAL | Status: DC
  Administered 2012-08-03 – 2012-08-05 (×3): 325 mg via ORAL
  Filled 2012-08-03 (×3): qty 1

## 2012-08-03 MED ORDER — ALBUTEROL SULFATE HFA 108 (90 BASE) MCG/ACT IN AERS
2.0000 | INHALATION_SPRAY | Freq: Three times a day (TID) | RESPIRATORY_TRACT | Status: DC
  Administered 2012-08-03 – 2012-08-05 (×6): 2 via RESPIRATORY_TRACT
  Filled 2012-08-03: qty 6.7

## 2012-08-03 MED ORDER — SODIUM CHLORIDE 0.9 % IV SOLN
INTRAVENOUS | Status: DC
  Administered 2012-08-03: 01:00:00 via INTRAVENOUS

## 2012-08-03 MED ORDER — ASPIRIN EC 81 MG PO TBEC: 162.0000 mg | DELAYED_RELEASE_TABLET | Freq: Every day | ORAL | Status: DC

## 2012-08-03 MED ORDER — ALBUTEROL SULFATE (5 MG/ML) 0.5% IN NEBU: 2.5000 mg | INHALATION_SOLUTION | RESPIRATORY_TRACT | Status: DC | PRN

## 2012-08-03 MED ORDER — CEFTRIAXONE SODIUM 1 G IJ SOLR: 1.0000 g | INTRAMUSCULAR | Status: DC

## 2012-08-03 MED ORDER — OXYCODONE HCL 5 MG PO TABS
5.0000 mg | ORAL_TABLET | Freq: Four times a day (QID) | ORAL | Status: DC | PRN
  Administered 2012-08-03 – 2012-08-05 (×8): 5 mg via ORAL
  Filled 2012-08-03 (×7): qty 1
  Filled 2012-08-03: qty 3
  Filled 2012-08-03: qty 1

## 2012-08-03 MED ORDER — AMITRIPTYLINE HCL 50 MG PO TABS
50.0000 mg | ORAL_TABLET | Freq: Every day | ORAL | Status: DC
  Administered 2012-08-03 – 2012-08-04 (×2): 50 mg via ORAL
  Filled 2012-08-03 (×3): qty 1

## 2012-08-03 MED ORDER — ALBUTEROL SULFATE HFA 108 (90 BASE) MCG/ACT IN AERS
2.0000 | INHALATION_SPRAY | RESPIRATORY_TRACT | Status: DC
  Administered 2012-08-03 (×2): 2 via RESPIRATORY_TRACT
  Filled 2012-08-03: qty 6.7

## 2012-08-03 MED ORDER — ACETAMINOPHEN 650 MG RE SUPP: 650.0000 mg | Freq: Four times a day (QID) | RECTAL | Status: DC | PRN

## 2012-08-03 MED ORDER — SODIUM CHLORIDE 0.9 % IJ SOLN
3.0000 mL | Freq: Two times a day (BID) | INTRAMUSCULAR | Status: DC
  Administered 2012-08-04: 3 mL via INTRAVENOUS

## 2012-08-03 MED ORDER — HEPARIN SODIUM (PORCINE) 5000 UNIT/ML IJ SOLN
5000.0000 [IU] | Freq: Three times a day (TID) | INTRAMUSCULAR | Status: DC
  Administered 2012-08-03 – 2012-08-05 (×8): 5000 [IU] via SUBCUTANEOUS
  Filled 2012-08-03 (×10): qty 1

## 2012-08-03 MED ORDER — POTASSIUM CHLORIDE IN NACL 20-0.9 MEQ/L-% IV SOLN
INTRAVENOUS | Status: DC
  Administered 2012-08-03 – 2012-08-04 (×3): via INTRAVENOUS
  Filled 2012-08-03 (×5): qty 1000

## 2012-08-03 MED ORDER — SODIUM CHLORIDE 0.9 % IV SOLN
1.5000 g | Freq: Four times a day (QID) | INTRAVENOUS | Status: DC
  Filled 2012-08-03 (×2): qty 1.5

## 2012-08-03 MED ORDER — IPRATROPIUM BROMIDE 0.02 % IN SOLN
0.5000 mg | RESPIRATORY_TRACT | Status: DC
  Administered 2012-08-03: 0.5 mg via RESPIRATORY_TRACT
  Filled 2012-08-03: qty 2.5

## 2012-08-03 MED ORDER — ZIPRASIDONE HCL 80 MG PO CAPS
80.0000 mg | ORAL_CAPSULE | Freq: Two times a day (BID) | ORAL | Status: DC
Start: 2012-08-03 — End: 2012-08-03
  Filled 2012-08-03 (×2): qty 1

## 2012-08-03 NOTE — Progress Notes (Signed)
Received patient from 3300.Patient alert and oriented x4.Oriented to room and unit routines.Patient currently NPO.Family members at bedside.Patient requests all side rails up x4.Patient advised to call for any assistance.Call bell within reach.Bed alarm turned on. Donetta Isaza Joselita,RN

## 2012-08-03 NOTE — H&P (Signed)
[facility] History and Physical  GRACI HULCE  ZOX:[id number]  DOB: March 13, 1955  DOA: 08/02/2012  Referring physician: Dr. Ethelda Chick PCP: Milinda Antis, MD   Chief Complaint: Dysarthria, chest pain, left leg swelling  HPI: Tonya Sullivan is a 57 y.o. female with Past medical history of COPD, hypertension, hypothyroidism, bipolar disorder. She presented to [facility] with the complaint of dysarthria. As per the patient when she woke up at 8:30 in the morning the daughter has noted that she had slurred speech. The patient denied any numbness which are new for her. She had chronic bilateral numbness in her finger. The patient was not able to lift up coffee cup with both of her hands. She denied any weakness in the lower extremity. There was a component of confusion as well.  After this this the patient decided to sleep. She woke up around 3:00 at which time she had midsternal chest pain with shortness of breath felt like pressure in last for 1 hr, and resolved on its own. Patient also denied persistent dizziness that has been ongoing since last few days, complaint of choking on her food on and off since last few weeks.  She also complained that last night when she was sleeping she had some wheezing. She denies any cough or fever or chills.  She also mentions that part of what she was doing this morning she does not remember.  Review of Systems: as mentioned in the history of present illness.  A Comprehensive review of the other systems is negative.  Past Medical History  Diagnosis Date  . COPD (chronic obstructive pulmonary disease)   . Depression   . Chronic back pain     DDD, disc bulge, radiculopathy, spinal stenosis  . Hyperlipidemia   . Anxiety   . Bipolar disorder, unspecified   . Carotid artery calcification   . Obsessive-compulsive disorder   . DDD (degenerative disc disease), lumbosacral   . Spinal stenosis, lumbar   . Melanoma     removed 1 week ago at  Dr Laurena Slimmer office  . Thyroid disease   . Autoimmune thyroiditis    Past Surgical History  Procedure Laterality Date  . Appendectomy    . Tonsillectomy    . Cholecystectomy    . Lump left breast      removed- benign  . Abdominal hysterectomy      tubal pregnancy  . Inguinal hernia repair    . Stomach surgery      ?holes in esophgous  . Ectopic pregnancy surgery    . Back surgery      x 5;1984;1989;;1999;2000;2010  . Colonoscopy  01/25/2012    Procedure: COLONOSCOPY;  Surgeon: Malissa Hippo, MD;  Location: [facility];  Service: Endoscopy;  Laterality: N/A;  130   Social History:  reports that she has been smoking Cigarettes.  She has a 40 pack-year smoking history. She has never used smokeless tobacco. She reports that she does not drink alcohol or use illicit drugs. Patient is coming from home. Patient can participate in ADLs.  Allergies  Allergen Reactions  . Codeine Anaphylaxis  . Cyclobenzaprine Anaphylaxis  . Darvocet (Propoxyphene-Acetaminophen) Anaphylaxis  . Abilify (Aripiprazole) Other (See Comments)    tremors  . Adhesive (Tape) Hives  . Dilaudid (Hydromorphone Hcl) Nausea And Vomiting    Only the pills.   . Iodine Hives  . Levofloxacin Hives  . Prednisone Hives  . Sulfa Antibiotics Itching    Family History  Problem Relation  Age of Onset  . Hypertension Mother   . Heart disease Mother   . Heart attack Mother   . Anxiety disorder Mother   . Cancer Father     lung cancer  . Heart attack Father   . Alcohol abuse Father   . Hypertension Brother   . Alcohol abuse Brother   . Bipolar disorder Brother   . ADD / ADHD Brother   . Drug abuse Brother   . OCD Brother   . Alcohol abuse Sister   . Bipolar disorder Sister   . Anxiety disorder Sister   . ADD / ADHD Sister   . Drug abuse Sister   . Physical abuse Sister   . Sexual abuse Sister   . Alcohol abuse Brother   . Bipolar disorder Brother   . ADD / ADHD Brother   . Drug abuse Brother   . Alcohol  abuse Sister   . Bipolar disorder Sister   . Anxiety disorder Sister   . ADD / ADHD Sister   . Physical abuse Sister   . Sexual abuse Sister   . Alcohol abuse Brother   . Bipolar disorder Brother   . ADD / ADHD Brother   . Bipolar disorder Brother   . ADD / ADHD Brother   . Alcohol abuse Brother   . Paranoid behavior Brother   . Physical abuse Brother   . Sexual abuse Brother   . Bipolar disorder Maternal Aunt   . Bipolar disorder Paternal Aunt   . Bipolar disorder Maternal Uncle   . Bipolar disorder Paternal Uncle   . Bipolar disorder Maternal Grandfather   . Alcohol abuse Maternal Grandfather   . Bipolar disorder Maternal Grandmother   . Alcohol abuse Maternal Grandmother   . Anxiety disorder Maternal Grandmother   . Dementia Maternal Grandmother   . Bipolar disorder Paternal Grandfather   . Alcohol abuse Paternal Grandfather   . Bipolar disorder Paternal Grandmother   . Alcohol abuse Paternal Grandmother   . Drug abuse Paternal Grandmother   . Bipolar disorder Maternal Uncle   . Schizophrenia Neg Hx   . Seizures Neg Hx     Prior to Admission medications   Medication Sig Start Date End Date Taking? Authorizing Provider  albuterol (PROVENTIL HFA) 108 (90 BASE) MCG/ACT inhaler Inhale 2 puffs into the lungs every 6 (six) hours as needed for wheezing or shortness of breath.    Historical Provider, MD  albuterol (PROVENTIL) (2.5 MG/3ML) 0.083% nebulizer solution Take 3 mLs (2.5 mg total) by nebulization every 4 (four) hours as needed for wheezing. 05/01/12   Salley Scarlet, MD  amitriptyline (ELAVIL) 50 MG tablet Take 1 tablet (50 mg total) by mouth at bedtime. 07/24/12   Cleotis Nipper, MD  aspirin EC 81 MG tablet Take 162 mg by mouth daily.    Historical Provider, MD  busPIRone (BUSPAR) 15 MG tablet Take 1 tablet (15 mg total) by mouth 3 (three) times daily. 07/24/12   Cleotis Nipper, MD  cyanocobalamin 500 MCG tablet Take 500 mcg by mouth daily.    Historical Provider, MD   DULoxetine (CYMBALTA) 30 MG capsule Take 1 capsule (30 mg total) by mouth daily. 07/24/12   Cleotis Nipper, MD  fish oil-omega-3 fatty acids 1000 MG capsule Take 1 g by mouth every morning.     Historical Provider, MD  hydrochlorothiazide (HYDRODIURIL) 25 MG tablet Take 1 tablet (25 mg total) by mouth daily. For blood pressure and swelling 05/08/12  Salley Scarlet, MD  lidocaine (LIDODERM) 5 % Place 1 patch onto the skin every 12 (twelve) hours. Remove & Discard patch within 12 hours or as directed by MD 05/22/12 05/22/13  Mike Craze, MD  losartan (COZAAR) 50 MG tablet Take 1 tablet (50 mg total) by mouth daily. 05/13/12   Gaylord Shih, MD  oxyCODONE-acetaminophen (PERCOCET) 10-325 MG per tablet Take 1 tablet by mouth every 6 (six) hours as needed. For pain    Historical Provider, MD  pravastatin (PRAVACHOL) 40 MG tablet TAKE 1 TABLET BY MOUTH EVERY NIGHT AT BEDTIME 05/16/12   Kerri Perches, MD  propranolol (INDERAL) 10 MG tablet Take 2 tablets (20 mg total) by mouth 3 (three) times daily. 05/05/12   Salley Scarlet, MD  PROVENTIL HFA 108 (90 BASE) MCG/ACT inhaler INHALE 2 PUFFS BY MOUTH EVERY 4 HOURS AS NEEDED FOR SHORTNESS OF BREATH 06/19/12   Salley Scarlet, MD  tiotropium (SPIRIVA HANDIHALER) 18 MCG inhalation capsule Place 1 capsule (18 mcg total) into inhaler and inhale daily. 01/01/12   Salley Scarlet, MD  triamcinolone (KENALOG) 0.1 % paste 1 application daily. 04/29/12   Historical Provider, MD  ziprasidone (GEODON) 80 MG capsule Take 1 capsule (80 mg total) by mouth 2 (two) times daily with a meal. 07/24/12   Cleotis Nipper, MD    Physical Exam: Filed Vitals:   08/02/12 2349 08/03/12 0107 08/03/12 0212 08/03/12 0252  BP: 114/76 98/54  113/63  Pulse: 84 81  98  Temp:  98.6 F (37 C)  97.2 F (36.2 C)  TempSrc:  Oral  Oral  Resp: 18 16  21   Height:  5\' 7"  (1.702 m)    Weight:  94.5 kg (208 lb 5.4 oz)    SpO2: 95% 96% 99% 100%    General: Alert, Awake and Oriented to Time,  Place and Person. Appear in mild distress Eyes: PERRL ENT: Oral Mucosa clear moist. Neck: no JVD, no Carotid Bruits, no Stiffness Cardiovascular: S1 and S2 Present, systolic aortic Murmur, Peripheral Pulses Present Respiratory: Bilateral Air entry equal and Decreased, bilateral basal Crackles, no wheezes Abdomen: Bowel Sound Present, Soft and Non tender, Skin: No Rash Extremities: Bilateral Pedal edema left more than right, left leg appears red and warm as compared to right,  no calf tenderness Neurologic: Mental status appropriate evaluation no dysarthria no aphasia, 3 out of 2 component was recalled. Cranial Nerves  pupils are equal and reactive to light, normal gag reflex, normal shoulder shrug, sensation intact on the face, Motor strength  equal on both sides 4/5 upper and lower extremity, Sensation  equal on both sides for light touch, reflexes  equal in both sides reflexes and biceps, babinski  equivocal, Proprioception  normal,  Cerebellar test  past-pointing on the left, normal finger-nose-finger on the right. Difficulty performing repeated movement, but was able to complete the movement.  Mild ataxia.  Presence of horizontal nystagmus on eye movement on right. Difficulty performing heel-to-shin both side.   Labs on Admission:  Basic Metabolic Panel:  Recent Labs Lab 08/02/12 1855 08/03/12 0210  NA 133* 135  K 3.9 3.6  CL 95* 102  CO2 27 25  GLUCOSE 104* 89  BUN 38* 27*  CREATININE 1.90* 1.24*  CALCIUM 10.2 8.3*   Liver Function Tests:  Recent Labs Lab 08/03/12 0210  AST 20  ALT 12  ALKPHOS 47  BILITOT 0.2*  PROT 6.2  ALBUMIN 2.9*   No results found for this basename:  LIPASE, AMYLASE,  in the last 168 hours No results found for this basename: AMMONIA,  in the last 168 hours CBC:  Recent Labs Lab 08/02/12 1855 08/03/12 0210  WBC 15.1* 11.7*  NEUTROABS 12.7* 9.2*  HGB 13.3 12.0  HCT 39.6 35.2*  MCV 94.5 93.1  PLT 259 228   Cardiac  Enzymes:  Recent Labs Lab 08/02/12 1855 08/03/12 0213  TROPONINI <0.30 <0.30    BNP (last 3 results) No results found for this basename: PROBNP,  in the last 8760 hours CBG:  Recent Labs Lab 08/02/12 1853  GLUCAP 123*    Radiological Exams on Admission: Dg Chest 2 View  08/02/2012   *RADIOLOGY REPORT*  Clinical Data: Chest pain  CHEST - 2 VIEW  Comparison: 02/25/2011  Findings: The heart and pulmonary vascularity are within normal limits.  Some mild increased density is noted in the right lung base consistent with early infiltrate. No bony abnormality is noted.  IMPRESSION: Early right basilar infiltrate.   Original Report Authenticated By: Alcide Clever, M.D.   Ct Head Wo Contrast  08/02/2012   *RADIOLOGY REPORT*  Clinical Data: 57 year old female with slurred speech, confusion and bilateral arm numbness.  CT HEAD WITHOUT CONTRAST  Technique:  Contiguous axial images were obtained from the base of the skull through the vertex without contrast.  Comparison: 07/28/2009 head CT  Findings: No intracranial abnormalities are identified, including mass lesion or mass effect, hydrocephalus, extra-axial fluid collection, midline shift, hemorrhage, or acute infarction.  The visualized bony calvarium is unremarkable.  IMPRESSION: Unremarkable noncontrast head CT.   Original Report Authenticated By: Harmon Pier, M.D.   Ct Chest Wo Contrast  08/02/2012   *RADIOLOGY REPORT*  Clinical Data: 57 year old female with chest pain and bilateral arm numbness.  CT CHEST WITHOUT CONTRAST  Technique:  Multidetector CT imaging of the chest was performed following the standard protocol without IV contrast, which was not administered secondary to patient's renal insufficiency.  Comparison: 03/12/2010 chest CT  Findings: Mild to moderate coronary artery calcifications again noted. The heart and great vessels are unchanged and within normal limits otherwise. There is no evidence of thoracic aortic aneurysm.  No pleural or  pericardial effusions are identified. There are no enlarged lymph nodes.  Tree in bud opacities throughout the right lung are compatible with infection. The left lung is clear. There is no evidence of consolidation or pulmonary mass.  No acute or suspicious bony abnormalities are identified. Punctate nonobstructing left upper pole renal calculi are identified. The patient is status post cholecystectomy.  IMPRESSION: Tree in bud opacities throughout the right lung compatible with infection.  Coronary artery disease.  Punctate nonobstructing left upper pole renal calculi.   Original Report Authenticated By: Harmon Pier, M.D.    EKG: Independently reviewed. No ST-T wave changes suggestive of ischemia  Assessment/Plan Principal Problem:   CAP (community acquired pneumonia) Active Problems:   Hyperlipidemia   Chronic pain   COPD (chronic obstructive pulmonary disease)   Tobacco use   Chest pain   TIA (transient ischemic attack)   Dysarthria   Cellulitis of leg, left   1. community-acquired pneumonia with left leg cellulitis The patient has a CT scan which ruled out possible dissection or aneurysm. But he does show tree in bud appearance which is suspicious for bronchiolitis. In the setting of complaint of wheezing and shortness of breath and chest pain with mild leukocytosis it is likely that she could have atypical pneumonia. She also complained that she has been having difficulty  swallowing since last few weeks, and there is a possibility of aspiration She also has left leg redness and warmth associated with swelling, which is suspicious for cellulitis. She does not have any risk factor for MRSA. Considering this considering this we will treat her with azithromycin for atypical coverage and Unasyn which should be adequate for cellulitis, aspiration pneumonitis, and community-acquired pneumonia. Gentle hydration will be continued. She does not have any component of COPD exacerbation at present.  We will continue with home dose of inhalers.  2. TIA/CVA The patient has presence of nystagmus, and mildly abnormal cerebellar examination.  Her initial CT head is negative, and her symptoms started at early this morning therefore she is out of the window better for any TPA. Currently we will get serial troponin, telemetry, echocardiogram, carotid duplex and MRI brain for further workup.  3. Acute kidney injury Etiology unclear, getting better with hydration. More likely ATN and pre-renal secondary to infection.  Continue monitoring.   4. chest pain Initial troponin and EKG are negative Follow echocardiogram.  DVT Prophylaxis: subcutaneous Heparin Nutrition:  Cardiac diet  Code Status:  Full    Author: Lynden Oxford, MD [facility] Pager: [phone] 08/03/2012, 3:43 AM    If 7PM-7AM, please contact night-coverage [email or web address] Password [email or web address]

## 2012-08-03 NOTE — ED Notes (Signed)
[facility] here to transport pt to [facility]

## 2012-08-03 NOTE — Progress Notes (Signed)
57yo female awoke confused and c/o BUE numbness and chest pressure, to begin IV ABX for cellulitis and atypical PNA.  Will begin Unasyn 3g IV Q8H and monitor CBC, Cx, clinical progression.  Vernard Gambles, PharmD, BCPS 08/03/2012 2:41 AM

## 2012-08-03 NOTE — Progress Notes (Addendum)
[facility] Progress Note [place] TEAM 1 - Stepdown/ICU TEAM   [name] ZOX:[id number] DOB: Sep 05, 1955 DOA: 08/02/2012 PCP: Milinda Antis, MD  Brief narrative: A 57 year old female with COPD, hypertension, hypothyroidism and bipolar disorder. The patient presented to [facility] with a complaint of slurred speech and confusion which lasted for about an hour after she woke up in the morning. She also had other symptoms such as midsternal chest pain which lasted about an hour. She complains about choking on her food on and off for the past couple of weeks. And had also noticed some wheezing at night. She was diagnosed with pneumonia and admitted for treatment of this and further workup of other symptoms. She has had complaints of palpitations and was referred with the suspicion of pheochromocytoma to outpatient endocrine.  Assessment/Plan: Principal Problem: Pneumonia -I highly suspect that this is related to aspiration in relation to her dysphagia and regurgitation she has been placed on Unasyn and Zithromax to cover for community-acquired pneumonia-I will DC Zithromax  Active Problems: Dysphasia -Further workup done with barium esophagram reveals severe dysmotility along with strictures and therefore will consult GI and make her n.p.o. -He has seen GI in the past and even then has had surgery for her "GERD"  Confusion and slurred speech -Question TIA in setting of hypoxia from pneumonia -MRI is negative for CVA -The patient was recently taken off her her aspirin by her PCP for easy bruising -We have resumed at this  Hypertension -She has been out of her HCTZ -Do to hypotension we are holding all 3 of her antihypertensives  ARF - due to hypotension/ hydrate and follow     Hyperlipidemia -Continue statin    Chronic pain -This is due to neck pain for which she has a neurosurgical followup coming up -Continue chronic narcotics and lidocaine  patch  Bipolar disorder -States she takes amitriptyline, Geodon, BuSpar and Cymbalta     COPD (chronic obstructive pulmonary disease) -Currently stable despite the pneumonia -Continue nebs    Tobacco use -Has been advised to quit    Chest pain -Likely due to pneumonia  Left leg edema -She states she has swelling on and off -If this is not improve by tomorrow would recommend a duplex to rule out DVT  Disposition Plan: Follow in step down  Consultants: None will call GI today  Procedures: None  Antibiotics: Zithromax 8/2 - 8/3 Rocephin 8/2 Unasyn 8/2 >>   DVT prophylaxis: Heparin  HPI/Subjective: Patient complains of pain in the center of her chest when she swallows she is unable to complete the swallow and has been regurgitating her food this week. She states that her speech is back to normal. No other neurological symptoms other than having trouble with balance lately.  Objective: Blood pressure 113/63, pulse 98, temperature 97.7 F (36.5 C), temperature source Oral, resp. rate 21, height 5\' 7"  (1.702 m), weight 94.5 kg (208 lb 5.4 oz), SpO2 96.00%.  Intake/Output Summary (Last 24 hours) at 08/03/12 1635 Last data filed at 08/02/12 2107  Gross per 24 hour  Intake   2000 ml  Output      0 ml  Net   2000 ml     Exam: General: No acute respiratory distress Lungs: Clear to auscultation bilaterally without wheezes or crackles Cardiovascular: Regular rate and rhythm without murmur gallop or rub normal S1 and S2 Abdomen: Nontender, nondistended, soft, bowel sounds positive, no rebound, no ascites, no appreciable mass Extremities: No significant cyanosis, clubbing-left  leg edema   Data Reviewed: Basic Metabolic Panel:  Recent Labs Lab 08/02/12 1855 08/03/12 0210  NA 133* 135  K 3.9 3.6  CL 95* 102  CO2 27 25  GLUCOSE 104* 89  BUN 38* 27*  CREATININE 1.90* 1.24*  CALCIUM 10.2 8.3*   Liver Function Tests:  Recent Labs Lab 08/03/12 0210  AST 20   ALT 12  ALKPHOS 47  BILITOT 0.2*  PROT 6.2  ALBUMIN 2.9*   No results found for this basename: LIPASE, AMYLASE,  in the last 168 hours No results found for this basename: AMMONIA,  in the last 168 hours CBC:  Recent Labs Lab 08/02/12 1855 08/03/12 0210  WBC 15.1* 11.7*  NEUTROABS 12.7* 9.2*  HGB 13.3 12.0  HCT 39.6 35.2*  MCV 94.5 93.1  PLT 259 228   Cardiac Enzymes:  Recent Labs Lab 08/02/12 1855 08/03/12 0213 08/03/12 0850 08/03/12 1410  TROPONINI <0.30 <0.30 <0.30 0.32*   BNP (last 3 results)  Recent Labs  08/03/12 0850  PROBNP 745.1*   CBG:  Recent Labs Lab 08/02/12 1853  GLUCAP 123*    Recent Results (from the past 240 hour(s))  MRSA PCR SCREENING     Status: None   Collection Time    08/03/12  1:34 AM      Result Value Range Status   MRSA by PCR NEGATIVE  NEGATIVE Final   Comment:            The GeneXpert MRSA Assay (FDA     approved for NASAL specimens     only), is one component of a     comprehensive MRSA colonization     surveillance program. It is not     intended to diagnose MRSA     infection nor to guide or     monitor treatment for     MRSA infections.     Studies:  Recent x-ray studies have been reviewed in detail by the Attending Physician  Scheduled Meds:  Scheduled Meds: . albuterol  2 puff Inhalation Q4H  . amitriptyline  50 mg Oral QHS  . ampicillin-sulbactam (UNASYN) IV  3 g Intravenous Q8H  . aspirin EC  325 mg Oral Daily  . azithromycin  500 mg Oral Daily  . busPIRone  15 mg Oral TID  . DULoxetine  30 mg Oral Daily  . guaiFENesin  600 mg Oral BID  . heparin  5,000 Units Subcutaneous Q8H  . lidocaine  1 patch Transdermal Q24H  . simvastatin  20 mg Oral q1800  . sodium chloride  3 mL Intravenous Q12H  . tiotropium  18 mcg Inhalation Daily  . ziprasidone  80 mg Oral BID WC   Continuous Infusions: . 0.9 % NaCl with KCl 20 mEq / L 75 mL/hr at 08/03/12 0600    Time spent on care of this patient: 35  minutes   Shahida Schnackenberg , M.D.  [facility] Office  [phone] Pager - Text Page per Loretha Stapler as per below:  On-Call/Text Page:      [email or web address]      password [email or web address]  If 7PM-7AM, please contact night-coverage [email or web address] Password [facility] 08/03/2012, 4:35 PM   LOS: 1 day

## 2012-08-03 NOTE — Progress Notes (Signed)
Attempted to call report,RN unavailable at this time. Dorothy Landgrebe Joselita,RN

## 2012-08-03 NOTE — Consult Note (Signed)
[facility] CONSULT Reason for consult: dysphagia abnormal barium swallow Referring Physician: triad hospitalist, PCP: Dr. Berneta Levins is an 57 y.o. female.  HPI: patient has a history of bipolar disease COPD in hypertension and hyperthyroidism. She came into the ER with this far 3 and slurred speech and they thought she may been having a stroke and she was admitted. MR either he did not show an acute stroke it was felt she may have had a TIA. Chest x-ray showed infiltrate it was felt to be early pneumonia. The patient has had dysphagia for only 2 weeks and it was felt that she made aspirate. She history of open Nissen fundoplication done in [place] about 20 years ago. She has had no problems with reflux or dysphagia sense until 2 weeks ago when she began to have intermittent problems swallowing of liquids and solids with the sensation they were catching in the back of her throat. 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 But tablet did not pass GE junction. There was pharyngeal penetration in the patient is currently NPO the patient adamantly denies any problems swallowing until about 2 weeks ago. Past Medical History  Diagnosis Date  . COPD (chronic obstructive pulmonary disease)   . Depression   . Chronic back pain     DDD, disc bulge, radiculopathy, spinal stenosis  . Hyperlipidemia   . Anxiety   . Bipolar disorder, unspecified   . Carotid artery calcification   . Obsessive-compulsive disorder   . DDD (degenerative disc disease), lumbosacral   . Spinal stenosis, lumbar   . Melanoma     removed 1 week ago at Dr Laurena Slimmer office  . Thyroid disease   . Autoimmune thyroiditis     Past Surgical History  Procedure Laterality Date  . Appendectomy    . Tonsillectomy    . Cholecystectomy    . Lump left breast      removed- benign  . Abdominal hysterectomy      tubal  pregnancy  . Inguinal hernia repair    . Stomach surgery      ?holes in esophgous  . Ectopic pregnancy surgery    . Back surgery      x 5;1984;1989;;1999;2000;2010  . Colonoscopy  01/25/2012    Procedure: COLONOSCOPY;  Surgeon: Malissa Hippo, MD;  Location: [facility];  Service: Endoscopy;  Laterality: N/A;  130    Family History  Problem Relation Age of Onset  . Hypertension Mother   . Heart disease Mother   . Heart attack Mother   . Anxiety disorder Mother   . Cancer Father     lung cancer  . Heart attack Father   . Alcohol abuse Father   . Hypertension Brother   . Alcohol abuse Brother   . Bipolar disorder Brother   . ADD / ADHD Brother   . Drug abuse Brother   . OCD Brother   . Alcohol abuse Sister   . Bipolar disorder Sister   . Anxiety disorder Sister   . ADD / ADHD Sister   . Drug abuse Sister   . Physical abuse Sister   . Sexual abuse Sister   . Alcohol abuse Brother   . Bipolar disorder Brother   . ADD / ADHD Brother   . Drug abuse Brother   . Alcohol abuse Sister   . Bipolar disorder Sister   . Anxiety disorder  Sister   . ADD / ADHD Sister   . Physical abuse Sister   . Sexual abuse Sister   . Alcohol abuse Brother   . Bipolar disorder Brother   . ADD / ADHD Brother   . Bipolar disorder Brother   . ADD / ADHD Brother   . Alcohol abuse Brother   . Paranoid behavior Brother   . Physical abuse Brother   . Sexual abuse Brother   . Bipolar disorder Maternal Aunt   . Bipolar disorder Paternal Aunt   . Bipolar disorder Maternal Uncle   . Bipolar disorder Paternal Uncle   . Bipolar disorder Maternal Grandfather   . Alcohol abuse Maternal Grandfather   . Bipolar disorder Maternal Grandmother   . Alcohol abuse Maternal Grandmother   . Anxiety disorder Maternal Grandmother   . Dementia Maternal Grandmother   . Bipolar disorder Paternal Grandfather   . Alcohol abuse Paternal Grandfather   . Bipolar disorder Paternal Grandmother   . Alcohol abuse  Paternal Grandmother   . Drug abuse Paternal Grandmother   . Bipolar disorder Maternal Uncle   . Schizophrenia Neg Hx   . Seizures Neg Hx     Social History:  reports that she has been smoking Cigarettes.  She has a 40 pack-year smoking history. She has never used smokeless tobacco. She reports that she does not drink alcohol or use illicit drugs.  Allergies:  Allergies  Allergen Reactions  . Codeine Anaphylaxis  . Cyclobenzaprine Anaphylaxis  . Darvocet (Propoxyphene-Acetaminophen) Anaphylaxis  . Abilify (Aripiprazole) Other (See Comments)    tremors  . Adhesive (Tape) Hives  . Dilaudid (Hydromorphone Hcl) Nausea And Vomiting    Only the pills.   . Iodine Hives  . Levofloxacin Hives  . Prednisone Hives  . Sulfa Antibiotics Itching    Medications; . albuterol  2 puff Inhalation Q4H  . amitriptyline  50 mg Oral QHS  . ampicillin-sulbactam (UNASYN) IV  3 g Intravenous Q8H  . aspirin EC  325 mg Oral Daily  . busPIRone  15 mg Oral TID  . DULoxetine  30 mg Oral Daily  . guaiFENesin  600 mg Oral BID  . heparin  5,000 Units Subcutaneous Q8H  . lidocaine  1 patch Transdermal Q24H  . simvastatin  20 mg Oral q1800  . sodium chloride  3 mL Intravenous Q12H  . tiotropium  18 mcg Inhalation Daily  . ziprasidone  80 mg Oral BID WC   PRN Meds acetaminophen, acetaminophen, albuterol, ipratropium, oxyCODONE, oxyCODONE-acetaminophen Results for orders placed during the hospital encounter of 08/02/12 (from the past 48 hour(s))  GLUCOSE, CAPILLARY     Status: Abnormal   Collection Time    08/02/12  6:53 PM      Result Value Range   Glucose-Capillary 123 (*) 70 - 99 mg/dL  TROPONIN I     Status: None   Collection Time    08/02/12  6:55 PM      Result Value Range   Troponin I <0.30  <0.30 ng/mL   Comment:            Due to the release kinetics of cTnI,     a negative result within the first hours     of the onset of symptoms does not rule out     myocardial infarction with  certainty.     If myocardial infarction is still suspected,     repeat the test at appropriate intervals.  BASIC METABOLIC PANEL  Status: Abnormal   Collection Time    08/02/12  6:55 PM      Result Value Range   Sodium 133 (*) 135 - 145 mEq/L   Potassium 3.9  3.5 - 5.1 mEq/L   Chloride 95 (*) 96 - 112 mEq/L   CO2 27  19 - 32 mEq/L   Glucose, Bld 104 (*) 70 - 99 mg/dL   BUN 38 (*) 6 - 23 mg/dL   Creatinine, Ser 8.29 (*) 0.50 - 1.10 mg/dL   Calcium 56.2  8.4 - 13.0 mg/dL   GFR calc non Af Amer 28 (*) >90 mL/min   GFR calc Af Amer 33 (*) >90 mL/min   Comment:            The eGFR has been calculated     using the CKD EPI equation.     This calculation has not been     validated in all clinical     situations.     eGFR's persistently     <90 mL/min signify     possible Chronic Kidney Disease.  CBC WITH DIFFERENTIAL     Status: Abnormal   Collection Time    08/02/12  6:55 PM      Result Value Range   WBC 15.1 (*) 4.0 - 10.5 K/uL   RBC 4.19  3.87 - 5.11 MIL/uL   Hemoglobin 13.3  12.0 - 15.0 g/dL   HCT 86.5  78.4 - 69.6 %   MCV 94.5  78.0 - 100.0 fL   MCH 31.7  26.0 - 34.0 pg   MCHC 33.6  30.0 - 36.0 g/dL   RDW 29.5  28.4 - 13.2 %   Platelets 259  150 - 400 K/uL   Neutrophils Relative % 84 (*) 43 - 77 %   Neutro Abs 12.7 (*) 1.7 - 7.7 K/uL   Lymphocytes Relative 10 (*) 12 - 46 %   Lymphs Abs 1.5  0.7 - 4.0 K/uL   Monocytes Relative 5  3 - 12 %   Monocytes Absolute 0.8  0.1 - 1.0 K/uL   Eosinophils Relative 1  0 - 5 %   Eosinophils Absolute 0.1  0.0 - 0.7 K/uL   Basophils Relative 0  0 - 1 %   Basophils Absolute 0.0  0.0 - 0.1 K/uL  D-DIMER, QUANTITATIVE     Status: Abnormal   Collection Time    08/02/12  6:55 PM      Result Value Range   D-Dimer, Quant 0.56 (*) 0.00 - 0.48 ug/mL-FEU   Comment:            AT THE INHOUSE ESTABLISHED CUTOFF     VALUE OF 0.48 ug/mL FEU,     THIS ASSAY HAS BEEN DOCUMENTED     IN THE LITERATURE TO HAVE     A SENSITIVITY AND NEGATIVE      PREDICTIVE VALUE OF AT LEAST     98 TO 99%.  THE TEST RESULT     SHOULD BE CORRELATED WITH     AN ASSESSMENT OF THE CLINICAL     PROBABILITY OF DVT / VTE.  CG4 I-STAT (LACTIC ACID)     Status: None   Collection Time    08/02/12 10:43 PM      Result Value Range   Lactic Acid, Venous 0.57  0.5 - 2.2 mmol/L  MRSA PCR SCREENING     Status: None   Collection Time    08/03/12  1:34 AM  Result Value Range   MRSA by PCR NEGATIVE  NEGATIVE   Comment:            The GeneXpert MRSA Assay (FDA     approved for NASAL specimens     only), is one component of a     comprehensive MRSA colonization     surveillance program. It is not     intended to diagnose MRSA     infection nor to guide or     monitor treatment for     MRSA infections.  COMPREHENSIVE METABOLIC PANEL     Status: Abnormal   Collection Time    08/03/12  2:10 AM      Result Value Range   Sodium 135  135 - 145 mEq/L   Potassium 3.6  3.5 - 5.1 mEq/L   Chloride 102  96 - 112 mEq/L   CO2 25  19 - 32 mEq/L   Glucose, Bld 89  70 - 99 mg/dL   BUN 27 (*) 6 - 23 mg/dL   Creatinine, Ser 0.45 (*) 0.50 - 1.10 mg/dL   Calcium 8.3 (*) 8.4 - 10.5 mg/dL   Total Protein 6.2  6.0 - 8.3 g/dL   Albumin 2.9 (*) 3.5 - 5.2 g/dL   AST 20  0 - 37 U/L   ALT 12  0 - 35 U/L   Alkaline Phosphatase 47  39 - 117 U/L   Total Bilirubin 0.2 (*) 0.3 - 1.2 mg/dL   GFR calc non Af Amer 47 (*) >90 mL/min   GFR calc Af Amer 55 (*) >90 mL/min   Comment:            The eGFR has been calculated     using the CKD EPI equation.     This calculation has not been     validated in all clinical     situations.     eGFR's persistently     <90 mL/min signify     possible Chronic Kidney Disease.  CBC WITH DIFFERENTIAL     Status: Abnormal   Collection Time    08/03/12  2:10 AM      Result Value Range   WBC 11.7 (*) 4.0 - 10.5 K/uL   RBC 3.78 (*) 3.87 - 5.11 MIL/uL   Hemoglobin 12.0  12.0 - 15.0 g/dL   HCT 40.9 (*) 81.1 - 91.4 %   MCV 93.1  78.0 -  100.0 fL   MCH 31.7  26.0 - 34.0 pg   MCHC 34.1  30.0 - 36.0 g/dL   RDW 78.2  95.6 - 21.3 %   Platelets 228  150 - 400 K/uL   Neutrophils Relative % 78 (*) 43 - 77 %   Neutro Abs 9.2 (*) 1.7 - 7.7 K/uL   Lymphocytes Relative 15  12 - 46 %   Lymphs Abs 1.7  0.7 - 4.0 K/uL   Monocytes Relative 6  3 - 12 %   Monocytes Absolute 0.6  0.1 - 1.0 K/uL   Eosinophils Relative 2  0 - 5 %   Eosinophils Absolute 0.2  0.0 - 0.7 K/uL   Basophils Relative 0  0 - 1 %   Basophils Absolute 0.0  0.0 - 0.1 K/uL  TROPONIN I     Status: None   Collection Time    08/03/12  2:13 AM      Result Value Range   Troponin I <0.30  <0.30 ng/mL   Comment:  Due to the release kinetics of cTnI,     a negative result within the first hours     of the onset of symptoms does not rule out     myocardial infarction with certainty.     If myocardial infarction is still suspected,     repeat the test at appropriate intervals.  TROPONIN I     Status: None   Collection Time    08/03/12  8:50 AM      Result Value Range   Troponin I <0.30  <0.30 ng/mL   Comment:            Due to the release kinetics of cTnI,     a negative result within the first hours     of the onset of symptoms does not rule out     myocardial infarction with certainty.     If myocardial infarction is still suspected,     repeat the test at appropriate intervals.  PRO B NATRIURETIC PEPTIDE     Status: Abnormal   Collection Time    08/03/12  8:50 AM      Result Value Range   Pro B Natriuretic peptide (BNP) 745.1 (*) 0 - 125 pg/mL  TROPONIN I     Status: Abnormal   Collection Time    08/03/12  2:10 PM      Result Value Range   Troponin I 0.32 (*) <0.30 ng/mL   Comment:            Due to the release kinetics of cTnI,     a negative result within the first hours     of the onset of symptoms does not rule out     myocardial infarction with certainty.     If myocardial infarction is still suspected,     repeat the test at  appropriate intervals.     CRITICAL RESULT CALLED TO, READ BACK BY AND VERIFIED WITH:     Denice Bors [id number] MCCAULEG    Dg Chest 2 View  08/02/2012   *RADIOLOGY REPORT*  Clinical Data: Chest pain  CHEST - 2 VIEW  Comparison: 02/25/2011  Findings: The heart and pulmonary vascularity are within normal limits.  Some mild increased density is noted in the right lung base consistent with early infiltrate. No bony abnormality is noted.  IMPRESSION: Early right basilar infiltrate.   Original Report Authenticated By: Alcide Clever, M.D.   Ct Head Wo Contrast  08/02/2012   *RADIOLOGY REPORT*  Clinical Data: 57 year old female with slurred speech, confusion and bilateral arm numbness.  CT HEAD WITHOUT CONTRAST  Technique:  Contiguous axial images were obtained from the base of the skull through the vertex without contrast.  Comparison: 07/28/2009 head CT  Findings: No intracranial abnormalities are identified, including mass lesion or mass effect, hydrocephalus, extra-axial fluid collection, midline shift, hemorrhage, or acute infarction.  The visualized bony calvarium is unremarkable.  IMPRESSION: Unremarkable noncontrast head CT.   Original Report Authenticated By: Harmon Pier, M.D.   Ct Chest Wo Contrast  08/02/2012   *RADIOLOGY REPORT*  Clinical Data: 57 year old female with chest pain and bilateral arm numbness.  CT CHEST WITHOUT CONTRAST  Technique:  Multidetector CT imaging of the chest was performed following the standard protocol without IV contrast, which was not administered secondary to patient's renal insufficiency.  Comparison: 03/12/2010 chest CT  Findings: Mild to moderate coronary artery calcifications again noted. The heart and great vessels are unchanged and within normal limits otherwise. There is  no evidence of thoracic aortic aneurysm.  No pleural or pericardial effusions are identified. There are no enlarged lymph nodes.  Tree in bud opacities throughout the right lung are compatible with  infection. The left lung is clear. There is no evidence of consolidation or pulmonary mass.  No acute or suspicious bony abnormalities are identified. Punctate nonobstructing left upper pole renal calculi are identified. The patient is status post cholecystectomy.  IMPRESSION: Tree in bud opacities throughout the right lung compatible with infection.  Coronary artery disease.  Punctate nonobstructing left upper pole renal calculi.   Original Report Authenticated By: Harmon Pier, M.D.   Mr [facility] Wo Contrast  08/03/2012   *RADIOLOGY REPORT*  Clinical Data:  TIA.  Episode of slurred speech and upper extremity weakness.  Confusion.  MRI HEAD WITHOUT CONTRAST MRA HEAD WITHOUT CONTRAST  Technique:  Multiplanar, multiecho pulse sequences of the brain and surrounding structures were obtained without intravenous contrast. Angiographic images of the head were obtained using MRA technique without contrast.  Comparison:  CT head without contrast 08/02/2012 at [facility].  MRI brain 02/16/2005 at [facility].  MRI HEAD  Findings:  The diffusion weighted images demonstrate no evidence for acute or subacute infarction.  No hemorrhage or mass lesion is present.  The ventricles are of normal size.  No significant white matter disease is evident.  There is no significant extra-axial fluid collection.  Flow is present in the major intracranial arteries.  The globes and orbits are intact.  The paranasal sinuses and mastoid air cells are clear.  IMPRESSION: Negative MRI of the brain.  MRA HEAD  Findings: The internal carotid arteries are within normal limits from high cervical segments through the external 90.  The A1 and M1 segments are normal.  No definite anterior communicating artery is present.  The MCA bifurcations are within normal limits.  The ACA and MCA branch vessels are normal.  The left vertebral artery is slightly dominant to the right.  The PICA origins are visualized and within normal limits  bilaterally. The basilar artery is normal.  Both posterior cerebral arteries originate from the basilar tip.  PCA branch vessels are within normal limits.  IMPRESSION: Normal variant MRA circle of Willis without evidence for significant proximal stenosis, aneurysm, or branch vessel occlusion.   Original Report Authenticated By: Marin Roberts, M.D.   Mr Brain Wo Contrast  08/03/2012   *RADIOLOGY REPORT*  Clinical Data:  TIA.  Episode of slurred speech and upper extremity weakness.  Confusion.  MRI HEAD WITHOUT CONTRAST MRA HEAD WITHOUT CONTRAST  Technique:  Multiplanar, multiecho pulse sequences of the brain and surrounding structures were obtained without intravenous contrast. Angiographic images of the head were obtained using MRA technique without contrast.  Comparison:  CT head without contrast 08/02/2012 at [facility].  MRI brain 02/16/2005 at [facility].  MRI HEAD  Findings:  The diffusion weighted images demonstrate no evidence for acute or subacute infarction.  No hemorrhage or mass lesion is present.  The ventricles are of normal size.  No significant white matter disease is evident.  There is no significant extra-axial fluid collection.  Flow is present in the major intracranial arteries.  The globes and orbits are intact.  The paranasal sinuses and mastoid air cells are clear.  IMPRESSION: Negative MRI of the brain.  MRA HEAD  Findings: The internal carotid arteries are within normal limits from high cervical segments through the external 90.  The A1 and M1 segments  are normal.  No definite anterior communicating artery is present.  The MCA bifurcations are within normal limits.  The ACA and MCA branch vessels are normal.  The left vertebral artery is slightly dominant to the right.  The PICA origins are visualized and within normal limits bilaterally. The basilar artery is normal.  Both posterior cerebral arteries originate from the basilar tip.  PCA branch vessels are  within normal limits.  IMPRESSION: Normal variant MRA circle of Willis without evidence for significant proximal stenosis, aneurysm, or branch vessel occlusion.   Original Report Authenticated By: Marin Roberts, M.D.   Dg Esophagus  08/03/2012   *RADIOLOGY REPORT*  Clinical data:  Dysphagia with sensation of solid foods and liquid sticking in the upper esophagus or pharynx over the past 2 weeks.  ESOPHOGRAM / BARIUM SWALLOW  Technique:  Combined double contrast and single contrast examination performed using effervescent crystals, thick barium liquid, and thin barium liquid.  Comparison: CT chest yesterday.  No prior fluoroscopic studies.  Findings:  Patient swallowed the thick and thin barium liquid without difficulty.  Severe esophageal dysmotility is present, with essentially absent primary peristaltic wave and numerous tertiary esophageal contractions throughout the examination.  With the patient in the prone RAO position, none of the ingested barium passed through the esophagus.  However, with the patient erect, the esophagus did empty.  There is evidence of a stricture at the esophagogastric junction which did not allow passage of 12.5 mm barium tablet.  There is no evidence of hiatal hernia.  Gastroesophageal reflux was not elicited despite use of the water siphon maneuver.  No esophageal masses were identified.  Evaluation of the pharynx during rapid sequence imaging with swallows of thin barium liquid demonstrated spasm in the cricopharyngeus muscle versus a posterior pharyngeal web which did cause significant narrowing of the lower pharynx/upper esophagus. Note was made of laryngeal penetration, but there is no evidence of frank tracheal aspiration.  Fluoroscopy time:  2 minutes, 22 seconds, pulsed fluoroscopy  IMPRESSION:  1.  Severe esophageal dysmotility. 2.  Stricture at the esophagogastric junction which did not allow passage of a 12.5 mm barium tablet.  No evidence of hiatal hernia. 3.   Stricture at the junction of the pharynx in the upper esophagus which is due to cricopharyngeus muscle spasm and/or a lower pharyngeal web. 4.  Laryngeal penetration with swallows of thin barium liquid.  No evidence of frank tracheal aspiration.  Preliminary results were discussed with the patient at the time of the examination.   Original Report Authenticated By: Hulan Saas, M.D.               Blood pressure 113/63, pulse 98, temperature 97.7 F (36.5 C), temperature source Oral, resp. rate 21, height 5\' 7"  (1.702 m), weight 94.5 kg (208 lb 5.4 oz), SpO2 96.00%.  Physical exam:   General-- pleasant white female in no acute distress  Heart--regular rate and rhythm without murmurs are gallops Lungs--lungs clear Abdomen-- abdomen soft and nontender    Assessment: 1.  Dysphagia. 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  2. Status post open Nissen fundoplication. I don't think this is really what's causing her problems  3. Severe esophageal dysmotility  4. Probable aspiration pneumonia  5. Questionable TIA   Plan: we will follow with you. When her other problems have improved I think she will need at least EGD  and possibly Savary dilatation. Have discussed this with the patient.   Rosalynd Mcwright JR,Tamieka Rancourt L 08/03/2012, 6:57 PM

## 2012-08-04 DIAGNOSIS — I519 Heart disease, unspecified: Secondary | ICD-10-CM

## 2012-08-04 DIAGNOSIS — G459 Transient cerebral ischemic attack, unspecified: Secondary | ICD-10-CM

## 2012-08-04 LAB — BASIC METABOLIC PANEL
CO2: 29 mEq/L (ref 19–32)
Calcium: 8.5 mg/dL (ref 8.4–10.5)
GFR calc Af Amer: 80 mL/min — ABNORMAL LOW (ref >90)
GFR calc non Af Amer: 69 mL/min — ABNORMAL LOW (ref >90)
Sodium: 141 mEq/L (ref 135–145)

## 2012-08-04 LAB — LEGIONELLA ANTIGEN, URINE: Legionella Antigen, Urine: NEGATIVE

## 2012-08-04 LAB — GLUCOSE, CAPILLARY: Glucose-Capillary: 78 mg/dL (ref 70–99)

## 2012-08-04 MED ORDER — SODIUM CHLORIDE 0.9 % IV SOLN
INTRAVENOUS | Status: DC
  Administered 2012-08-04: 18:00:00 via INTRAVENOUS

## 2012-08-04 MED ORDER — PNEUMOCOCCAL VAC POLYVALENT 25 MCG/0.5ML IJ INJ
0.5000 mL | INJECTION | INTRAMUSCULAR | Status: AC
  Administered 2012-08-05: 0.5 mL via INTRAMUSCULAR
  Filled 2012-08-04: qty 0.5

## 2012-08-04 NOTE — Progress Notes (Signed)
VASCULAR LAB PRELIMINARY  PRELIMINARY  PRELIMINARY  PRELIMINARY  Carotid duplex completed.    Preliminary report:  Bilateral:  1 % to 39% ICA stenosis.  Vertebral artery flow is antegrade.     Waverly Tarquinio, RVS 08/04/2012, 12:13 PM

## 2012-08-04 NOTE — Progress Notes (Signed)
  Echocardiogram 2D Echocardiogram has been performed.  Tonya Sullivan 08/04/2012, 12:24 PM

## 2012-08-04 NOTE — Progress Notes (Signed)
Subjective: No complaints.  NPO at present.  Objective: Vital signs in last 24 hours: Temp:  [97.7 F (36.5 C)-98.3 F (36.8 C)] 98.3 F (36.8 C) (08/04 0549) Pulse Rate:  [76-89] 79 (08/04 0549) Resp:  [12-19] 18 (08/04 0549) BP: (94-121)/(50-78) 121/78 mmHg (08/04 0549) SpO2:  [94 %-100 %] 94 % (08/04 0549) Weight:  [95.9 kg (211 lb 6.7 oz)] 95.9 kg (211 lb 6.7 oz) (08/03 2102) Weight change: 1.4 kg (3 lb 1.4 oz) Last BM Date: 08/03/12  PE: GEN:  NAD  Lab Results: CBC    Component Value Date/Time   WBC 11.7* 08/03/2012 0210   RBC 3.78* 08/03/2012 0210   HGB 12.0 08/03/2012 0210   HCT 35.2* 08/03/2012 0210   PLT 228 08/03/2012 0210   MCV 93.1 08/03/2012 0210   MCH 31.7 08/03/2012 0210   MCHC 34.1 08/03/2012 0210   RDW 13.1 08/03/2012 0210   LYMPHSABS 1.7 08/03/2012 0210   MONOABS 0.6 08/03/2012 0210   EOSABS 0.2 08/03/2012 0210   BASOSABS 0.0 08/03/2012 0210   CMP     Component Value Date/Time   NA 141 08/04/2012 0515   K 4.2 08/04/2012 0515   CL 106 08/04/2012 0515   CO2 29 08/04/2012 0515   GLUCOSE 91 08/04/2012 0515   BUN 14 08/04/2012 0515   CREATININE 0.91 08/04/2012 0515   CREATININE 0.99 05/01/2012 1155   CALCIUM 8.5 08/04/2012 0515   PROT 6.2 08/03/2012 0210   ALBUMIN 2.9* 08/03/2012 0210   AST 20 08/03/2012 0210   ALT 12 08/03/2012 0210   ALKPHOS 47 08/03/2012 0210   BILITOT 0.2* 08/03/2012 0210   GFRNONAA 69* 08/04/2012 0515   GFRAA 80* 08/04/2012 0515   Assessment:  1.  Dysphagia.  Likely multifactorial; doubt the narrowing of GE junction is significant, given her prior Nissen. 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  Unclear role of her TIA into exacerbating her dysphagia symptoms.  Plan:  1.  Patient is high risk for endoscopy at this time, given likely aspiration pneumonia on recent CT  chest as well as some laryngeal penetration during her barium esophagram. 2.  Recommend speech therapy consultation.  Pending their evaluation, might also want ENT to evaluate her upper esophageal sphincter and cricopharyngeal region. 3.  Will follow.   Tonya Sullivan 08/04/2012, 8:52 AM

## 2012-08-04 NOTE — Progress Notes (Signed)
[facility] Progress Note [place] TEAM 1 - Stepdown/ICU TEAM   [name] ZOX:[id number] DOB: 1955/05/13 DOA: 08/02/2012 PCP: Milinda Antis, MD  Brief narrative: A 57 year old female with COPD, hypertension, hypothyroidism and bipolar disorder. The patient presented to [facility] with a complaint of slurred speech and confusion which lasted for about an hour after she woke up in the morning. She also had other symptoms such as midsternal chest pain which lasted about an hour. She complained about choking on her food on and off for the past couple of weeks. She had also noticed some wheezing at night. She was diagnosed with pneumonia and admitted for treatment of this and further workup of the other symptoms.   Assessment/Plan:  Pneumonia highly suspect this is related to aspiration in relation to her dysphagia and regurgitation - clinically improving on current empiric abx tx  Dysphagia barium esophagram revealed severe dysmotility along with strictures - GI following and contemplating further evalualtion - SLP consulted as first step in w/u - advancing diet as able based on SLP recs  Confusion and slurred speech ?TIA  - MRI is negative for CVA - recently taken off her her aspirin by her PCP for easy bruising - we have resumed at this - MRA negative - carotid dopplers and TTE unrevealing therefore w/u complete   Hypertension BP currently well controlled w/o medications   ARF due to hypotension/DH - much improved - f/u in AM  Hyperlipidemia continue statin  Chronic neck pain she has a neurosurgical followup coming up - continue chronic narcotics and lidocaine patch  Bipolar disorder states she takes amitriptyline, Geodon, BuSpar and Cymbalta   COPD (chronic obstructive pulmonary disease) Currently stable despite the pneumonia  Tobacco use advised to quit  Chest pain Likely due to pneumonia +/- esophageal spasms - much improved at this time    Left leg edema states she has swelling on and off - duplex to rule out DVT  Disposition Plan: await determination on extent of GI eval vs/ possible ENT eval - check LE doppler - if negative begin to ambulate - advance diet as able per SLP  Consultants: GI  Procedures: None  Antibiotics: Zithromax 8/2 >> 8/3 Rocephin 8/2 Unasyn 8/2 >>  DVT prophylaxis: Heparin  HPI/Subjective: No new complaints today. Denies f/c, sob, n/v, or chest pain.    Objective: Blood pressure 118/76, pulse 79, temperature 98.1 F (36.7 C), temperature source Oral, resp. rate 20, height 5\' 7"  (1.702 m), weight 95.9 kg (211 lb 6.7 oz), SpO2 99.00%.  Intake/Output Summary (Last 24 hours) at 08/04/12 1631 Last data filed at 08/04/12 1500  Gross per 24 hour  Intake 1284.17 ml  Output    750 ml  Net 534.17 ml    Exam: General: No acute respiratory distress Lungs: Clear to auscultation bilaterally without wheezes or crackles Cardiovascular: Regular rate and rhythm without murmur gallop or rub Abdomen: Nontender, nondistended, soft, bowel sounds positive, no rebound, no ascites, no appreciable mass Extremities: No significant cyanosis, clubbing - left leg 1+ edema w/ no R LE edema noted   Data Reviewed: Basic Metabolic Panel:  Recent Labs Lab 08/02/12 1855 08/03/12 0210 08/04/12 0515  NA 133* 135 141  K 3.9 3.6 4.2  CL 95* 102 106  CO2 27 25 29   GLUCOSE 104* 89 91  BUN 38* 27* 14  CREATININE 1.90* 1.24* 0.91  CALCIUM 10.2 8.3* 8.5   Liver Function Tests:  Recent Labs Lab 08/03/12 0210  AST 20  ALT 12  ALKPHOS 47  BILITOT 0.2*  PROT 6.2  ALBUMIN 2.9*   CBC:  Recent Labs Lab 08/02/12 1855 08/03/12 0210  WBC 15.1* 11.7*  NEUTROABS 12.7* 9.2*  HGB 13.3 12.0  HCT 39.6 35.2*  MCV 94.5 93.1  PLT 259 228   Cardiac Enzymes:  Recent Labs Lab 08/02/12 1855 08/03/12 0213 08/03/12 0850 08/03/12 1410  TROPONINI <0.30 <0.30 <0.30 0.32*   BNP (last 3 results)  Recent  Labs  08/03/12 0850  PROBNP 745.1*   CBG:  Recent Labs Lab 08/02/12 1853 08/03/12 2059 08/04/12 0720 08/04/12 1008  GLUCAP 123* 117* 78 78    Recent Results (from the past 240 hour(s))  MRSA PCR SCREENING     Status: None   Collection Time    08/03/12  1:34 AM      Result Value Range Status   MRSA by PCR NEGATIVE  NEGATIVE Final   Comment:            The GeneXpert MRSA Assay (FDA     approved for NASAL specimens     only), is one component of a     comprehensive MRSA colonization     surveillance program. It is not     intended to diagnose MRSA     infection nor to guide or     monitor treatment for     MRSA infections.  CULTURE, BLOOD (ROUTINE X 2)     Status: None   Collection Time    08/03/12  2:00 AM      Result Value Range Status   Specimen Description RIGHT ANTECUBITAL BLOOD   Final   Special Requests BOTTLES DRAWN AEROBIC ONLY 10CC   Final   Culture  Setup Time 08/03/2012 15:06   Final   Culture     Final   Value:        BLOOD CULTURE RECEIVED NO GROWTH TO DATE CULTURE WILL BE HELD FOR 5 DAYS BEFORE ISSUING A FINAL NEGATIVE REPORT   Report Status PENDING   Incomplete  CULTURE, BLOOD (ROUTINE X 2)     Status: None   Collection Time    08/03/12  2:10 AM      Result Value Range Status   Specimen Description BLOOD RIGHT ARM   Final   Special Requests BOTTLES DRAWN AEROBIC ONLY 5CC   Final   Culture  Setup Time 08/03/2012 15:05   Final   Culture     Final   Value:        BLOOD CULTURE RECEIVED NO GROWTH TO DATE CULTURE WILL BE HELD FOR 5 DAYS BEFORE ISSUING A FINAL NEGATIVE REPORT   Report Status PENDING   Incomplete     Studies:  Recent x-ray studies have been reviewed in detail by the Attending Physician  Scheduled Meds:  Scheduled Meds: . albuterol  2 puff Inhalation TID  . amitriptyline  50 mg Oral QHS  . ampicillin-sulbactam (UNASYN) IV  3 g Intravenous Q8H  . aspirin EC  325 mg Oral Daily  . busPIRone  15 mg Oral TID  . DULoxetine  30 mg Oral  Daily  . guaiFENesin  600 mg Oral BID  . heparin  5,000 Units Subcutaneous Q8H  . lidocaine  1 patch Transdermal Q24H  . [START ON 08/05/2012] pneumococcal 23 valent vaccine  0.5 mL Intramuscular Tomorrow-1000  . simvastatin  20 mg Oral q1800  . sodium chloride  3 mL Intravenous Q12H  . tiotropium  18 mcg Inhalation Daily  .  ziprasidone  80 mg Oral BID WC    Time spent on care of this patient: 35 minutes   Maverick Dieudonne T , M.D.  [facility] Office  [phone] Pager - Text Page per Loretha Stapler as per below:  On-Call/Text Page:      [email or web address]      password [email or web address]  If 7PM-7AM, please contact night-coverage [email or web address] Password [email or web address] 08/04/2012, 4:31 PM   LOS: 2 days

## 2012-08-04 NOTE — Evaluation (Signed)
Clinical/Bedside Swallow Evaluation Patient Details  Name: Tonya Sullivan MRN: [id number] Date of Birth: 05/08/1955  Today's Date: 08/04/2012 Time: 1415-     Past Medical History:  Past Medical History  Diagnosis Date  . COPD (chronic obstructive pulmonary disease)   . Depression   . Chronic back pain     DDD, disc bulge, radiculopathy, spinal stenosis  . Hyperlipidemia   . Anxiety   . Bipolar disorder, unspecified   . Carotid artery calcification   . Obsessive-compulsive disorder   . DDD (degenerative disc disease), lumbosacral   . Spinal stenosis, lumbar   . Melanoma     removed 1 week ago at Dr Laurena Slimmer office  . Thyroid disease   . Autoimmune thyroiditis    Past Surgical History:  Past Surgical History  Procedure Laterality Date  . Appendectomy    . Tonsillectomy    . Cholecystectomy    . Lump left breast      removed- benign  . Abdominal hysterectomy      tubal pregnancy  . Inguinal hernia repair    . Stomach surgery      ?holes in esophgous  . Ectopic pregnancy surgery    . Back surgery      x 5;1984;1989;;1999;2000;2010  . Colonoscopy  01/25/2012    Procedure: COLONOSCOPY;  Surgeon: Malissa Hippo, MD;  Location: [facility];  Service: Endoscopy;  Laterality: N/A;  130   HPI:  A 57 year old female with COPD, hypertension, hypothyroidism and bipolar disorder. The patient presented to [facility] with a complaint of slurred speech and confusion which lasted for about an hour after she woke up in the morning. She also had other symptoms such as midsternal chest pain which lasted about an hour. She complains about choking on her food on and off for the past couple of weeks. And had also noticed some wheezing at night. She was diagnosed with pneumonia and admitted for treatment of this and further workup of other symptoms. She has had complaints of palpitations and was referred with the suspicion of pheochromocytoma to outpatient endocrine   Assessment /  Plan / Recommendation Clinical Impression  Pt. 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  This is highly likely to be a primary esophageal dysphagia, with a secondary pharyngeal component due to food/liquids not emptying  the esophagus.  GI has been following and ordered ST to assist with diet recommendations.  There may be limited options for po's until the esophageal issues are resolved.  Will need objective evaluation to evaluate what pt. may tolerate best.  Pt. is adamant about being po's ASAP, and does not show overt s/s of aspiration at bedside with purees and thin liquids.  Will proceed cautiously with po's tonight, following aspiration and reflux precautions, and f/u with MBS 8/5.    Aspiration Risk  Moderate    Diet Recommendation Dysphagia 1 (Puree);Thin liquid   Liquid Administration via: Straw Medication Administration: Whole meds with liquid Supervision: Patient able to self feed;Full supervision/cueing for compensatory strategies Compensations: Slow rate;Small sips/bites;Follow solids with liquid Postural Changes and/or Swallow Maneuvers: Seated upright 90 degrees;Upright 30-60 min after meal    Other  Recommendations Recommended Consults: MBS Oral Care Recommendations: Oral care QID Other Recommendations: Have oral suction available;Clarify dietary restrictions   Follow Up Recommendations  None    Frequency and Duration  Pertinent Vitals/Pain n/a    SLP Swallow Goals     Swallow Study Prior Functional Status       General HPI: A 57 year old female with COPD, hypertension, hypothyroidism and bipolar disorder. The patient presented to [facility] with a complaint of slurred speech and confusion which lasted for about an hour after she woke up in the morning. She also had other symptoms such as midsternal  chest pain which lasted about an hour. She complains about choking on her food on and off for the past couple of weeks. And had also noticed some wheezing at night. She was diagnosed with pneumonia and admitted for treatment of this and further workup of other symptoms. She has had complaints of palpitations and was referred with the suspicion of pheochromocytoma to outpatient endocrine Type of Study: Bedside swallow evaluation Previous Swallow Assessment: Esophagram 08/03/12 Dysphagia with sensation of solid foods and liquid sticking in the upper esophagus or pharynx over the past 2 weeks.  ESOPHOGRAM / BARIUM SWALLOW IMPRESSION:  1.  Severe esophageal dysmotility. 2.  Stricture at the esophagogastric junction which did not allow passage of a 12.5 mm barium tablet.  No evidence of hiatal hernia. 3.  Stricture at the junction of the pharynx in the upper esophagus which is due to cricopharyngeus muscle spasm and/or a lower pharyngeal web. 4.  Laryngeal penetration with swallows of thin barium liquid.  No evidence of frank tracheal aspiration.   Diet Prior to this Study: NPO Temperature Spikes Noted: No Respiratory Status: Room air History of Recent Intubation: No Behavior/Cognition: Alert;Cooperative;Pleasant mood Oral Cavity - Dentition: Adequate natural dentition Self-Feeding Abilities: Able to feed self Patient Positioning: Upright in bed Baseline Vocal Quality: Clear;Hoarse (Smoker's voice) Volitional Cough: Strong Volitional Swallow: Able to elicit    Oral/Motor/Sensory Function Overall Oral Motor/Sensory Function: Appears within functional limits for tasks assessed   Ice Chips Ice chips: Within functional limits Presentation: Spoon   Thin Liquid Thin Liquid: Within functional limits Presentation: Spoon;Cup;Straw    Nectar Thick Nectar Thick Liquid: Not tested   Honey Thick Honey Thick Liquid: Not tested   Puree Puree: Within functional limits   Solid   GO    Solid: Not tested        Maryjo Rochester T 08/04/2012,3:41 PM

## 2012-08-05 ENCOUNTER — Ambulatory Visit: Payer: Medicare Other | Admitting: Family Medicine

## 2012-08-05 ENCOUNTER — Inpatient Hospital Stay (HOSPITAL_COMMUNITY): Payer: Medicare Other

## 2012-08-05 ENCOUNTER — Ambulatory Visit (HOSPITAL_COMMUNITY): Payer: Self-pay | Admitting: Psychiatry

## 2012-08-05 DIAGNOSIS — R1310 Dysphagia, unspecified: Secondary | ICD-10-CM

## 2012-08-05 DIAGNOSIS — M7989 Other specified soft tissue disorders: Secondary | ICD-10-CM

## 2012-08-05 LAB — CBC
HCT: 32 % — ABNORMAL LOW (ref 36.0–46.0)
Hemoglobin: 10.6 g/dL — ABNORMAL LOW (ref 12.0–15.0)
MCH: 31.5 pg (ref 26.0–34.0)
MCHC: 33.1 g/dL (ref 30.0–36.0)
RBC: 3.37 MIL/uL — ABNORMAL LOW (ref 3.87–5.11)

## 2012-08-05 LAB — BASIC METABOLIC PANEL
BUN: 9 mg/dL (ref 6–23)
CO2: 29 mEq/L (ref 19–32)
Chloride: 105 mEq/L (ref 96–112)
Glucose, Bld: 88 mg/dL (ref 70–99)
Potassium: 4.5 mEq/L (ref 3.5–5.1)

## 2012-08-05 LAB — GLUCOSE, CAPILLARY: Glucose-Capillary: 85 mg/dL (ref 70–99)

## 2012-08-05 MED ORDER — GUAIFENESIN ER 600 MG PO TB12: 600.0000 mg | ORAL_TABLET | Freq: Two times a day (BID) | ORAL | Status: DC

## 2012-08-05 MED ORDER — AMOXICILLIN-POT CLAVULANATE 875-125 MG PO TABS: 1.0000 | ORAL_TABLET | Freq: Two times a day (BID) | ORAL | Status: DC

## 2012-08-05 NOTE — Procedures (Signed)
Objective Swallowing Evaluation: Modified Barium Swallowing Study  Patient Details  Name: Tonya Sullivan MRN: [id number] Date of Birth: [birth year]  Today's Date: 08/05/2012 Time: 1150-1230 SLP Time Calculation (min): 40 min  Past Medical History:  Past Medical History  Diagnosis Date  . COPD (chronic obstructive pulmonary disease)   . Depression   . Chronic back pain     DDD, disc bulge, radiculopathy, spinal stenosis  . Hyperlipidemia   . Anxiety   . Bipolar disorder, unspecified   . Carotid artery calcification   . Obsessive-compulsive disorder   . DDD (degenerative disc disease), lumbosacral   . Spinal stenosis, lumbar   . Melanoma     removed 1 week ago at Dr Laurena Slimmer office  . Thyroid disease   . Autoimmune thyroiditis    Past Surgical History:  Past Surgical History  Procedure Laterality Date  . Appendectomy    . Tonsillectomy    . Cholecystectomy    . Lump left breast      removed- benign  . Abdominal hysterectomy      tubal pregnancy  . Inguinal hernia repair    . Stomach surgery      ?holes in esophgous  . Ectopic pregnancy surgery    . Back surgery      x 5;1984;1989;;1999;2000;2010  . Colonoscopy  01/25/2012    Procedure: COLONOSCOPY;  Surgeon: Malissa Hippo, MD;  Location: [facility];  Service: Endoscopy;  Laterality: N/A;  130   HPI:  A 57 year old female with COPD, hypertension, hypothyroidism and bipolar disorder. The patient presented to [facility] with a complaint of slurred speech and confusion which lasted for about an hour after she woke up in the morning. She also had other symptoms such as midsternal chest pain which lasted about an hour. She complains about choking on her food on and off for the past couple of weeks. And had also noticed some wheezing at night. She was diagnosed with pneumonia and admitted for treatment of this and further workup of other symptoms. 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 has only a mild pharyngeal dysphagia, with a what appears to be a moderate primary esophageal dysphagia, characterized by intermittent delayed swallow initiation (to pyriforms) with thin liquids, and only one instance of flash/trace penetration into the laryngeal vestibule, which immediately cleared.  The UES appeared to function normally, and there was no residue of any consistency in the valleculae, pyriforms, or on the UES.  The esophageal sweep revealed what appeared to be a narrowing at the GE juncture, backflow of liquids within the esophagus, and tertiary contractions.  The esophagus eventually emptied, other than the 12.4mm barium tablet, which remained at the GE juncture.  No radiologist was present to confirm these observations.  Question if dilitation would be appropriate for this patient?  Pt. is being followed by GI.    Treatment Recommendation  No treatment recommended at this time (Defer treatment plan to GI)    Diet Recommendation Dysphagia 3 (Mechanical Soft);Thin liquid   Liquid Administration via: Straw;Cup Medication Administration: Whole meds with liquid (Break in half or crush if large) Supervision: Patient able to self feed;Full supervision/cueing for compensatory strategies Compensations: Slow rate;Small sips/bites;Follow solids with liquid Postural Changes and/or Swallow Maneuvers: Seated upright 90 degrees;Upright 30-60 min after meal  Other  Recommendations Oral Care Recommendations: Oral care QID Other Recommendations: Clarify dietary restrictions   Follow Up Recommendations  None    Frequency and Duration        Pertinent Vitals/Pain n/a        General HPI: A 57 year old female with COPD, hypertension, hypothyroidism and bipolar disorder. The patient  presented to [facility] with a complaint of slurred speech and confusion which lasted for about an hour after she woke up in the morning. She also had other symptoms such as midsternal chest pain which lasted about an hour. She complains about choking on her food on and off for the past couple of weeks. And had also noticed some wheezing at night. She was diagnosed with pneumonia and admitted for treatment of this and further workup of other symptoms. She has had complaints of palpitations and was referred with the suspicion of pheochromocytoma to outpatient endocrine Type of Study: Modified Barium Swallowing Study Reason for Referral: Objectively evaluate swallowing function Previous Swallow Assessment: Bse 08/04/12 Diet Prior to this Study: Dysphagia 1 (puree);Thin liquids Temperature Spikes Noted: No Respiratory Status: Room air History of Recent Intubation: No Behavior/Cognition: Alert;Cooperative;Pleasant mood Oral Cavity - Dentition: Adequate natural dentition Oral Motor / Sensory Function: Within functional limits Self-Feeding Abilities: Able to feed self Patient Positioning: Upright in chair Baseline Vocal Quality: Clear;Hoarse Volitional Cough: Strong Volitional Swallow: Able to elicit Anatomy: Within functional limits (s/p Nissan fundiplication) Pharyngeal Secretions: Not observed secondary MBS    Reason for Referral Objectively evaluate swallowing function   Oral Phase Oral Preparation/Oral Phase Oral Phase: WFL   Pharyngeal Phase Pharyngeal Phase Pharyngeal Phase: Within functional limits  Cervical Esophageal Phase    GO    Cervical Esophageal Phase Cervical Esophageal Phase: Vicente Masson T 08/05/2012, 1:19 PM

## 2012-08-05 NOTE — Progress Notes (Signed)
Subjective: Tolerating current diet.  Objective: Vital signs in last 24 hours: Temp:  [97.5 F (36.4 C)-98.1 F (36.7 C)] 97.9 F (36.6 C) (08/05 0543) Pulse Rate:  [77-79] 77 (08/05 0543) Resp:  [18-20] 18 (08/05 0543) BP: (107-128)/(52-76) 128/74 mmHg (08/05 0543) SpO2:  [96 %-99 %] 98 % (08/05 0819) Weight:  [97.7 kg (215 lb 6.2 oz)] 97.7 kg (215 lb 6.2 oz) (08/04 2051) Weight change: 1.8 kg (3 lb 15.5 oz) Last BM Date: 08/03/12  PE: GEN:  NAD  Lab Results: CBC    Component Value Date/Time   WBC 4.9 08/05/2012 0500   RBC 3.37* 08/05/2012 0500   HGB 10.6* 08/05/2012 0500   HCT 32.0* 08/05/2012 0500   PLT 208 08/05/2012 0500   MCV 95.0 08/05/2012 0500   MCH 31.5 08/05/2012 0500   MCHC 33.1 08/05/2012 0500   RDW 13.4 08/05/2012 0500   LYMPHSABS 1.7 08/03/2012 0210   MONOABS 0.6 08/03/2012 0210   EOSABS 0.2 08/03/2012 0210   BASOSABS 0.0 08/03/2012 0210   CMP     Component Value Date/Time   NA 139 08/05/2012 0500   K 4.5 08/05/2012 0500   CL 105 08/05/2012 0500   CO2 29 08/05/2012 0500   GLUCOSE 88 08/05/2012 0500   BUN 9 08/05/2012 0500   CREATININE 0.87 08/05/2012 0500   CREATININE 0.99 05/01/2012 1155   CALCIUM 8.6 08/05/2012 0500   PROT 6.2 08/03/2012 0210   ALBUMIN 2.9* 08/03/2012 0210   AST 20 08/03/2012 0210   ALT 12 08/03/2012 0210   ALKPHOS 47 08/03/2012 0210   BILITOT 0.2* 08/03/2012 0210   GFRNONAA 73* 08/05/2012 0500   GFRAA 84* 08/05/2012 0500   Assessment:  1. Dysphagia. Likely multifactorial; doubt the narrowing of GE junction is significant, given her prior Nissen. 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 Unclear role of her TIA into exacerbating her dysphagia symptoms.  Plan:  1.  Advance diet as tolerated, under guidance of speech therapy. 2.  Both patient and I would like to avoid  endoscopy, given recent TIA and aspiration pneumonia, at this time. 3.  If patient tolerates diet without trouble, then will pursue outpatient endoscopy; if patient does not tolerate diet, then will need to consider inpatient endoscopy. 4.  Will follow; case discussed with hospitalist team.  Freddy Jaksch 08/05/2012, 8:53 AM

## 2012-08-05 NOTE — Discharge Summary (Addendum)
Physician Discharge Summary  Tonya Sullivan ZOX:[id number] DOB: [birth year] DOA: 08/02/2012  PCP: Milinda Antis, MD  Admit date: 08/02/2012 Discharge date: 08/05/2012  Time spent: 40 minutes  Recommendations for Outpatient Follow-up:  1. Home with outpt [facility] follow up in 2 weeks  Discharge Diagnoses:  Principal Problem:   Aspiration pneumonia  Active Problems:   Tobacco use   Dysphagia   Hyperlipidemia   Chronic pain   COPD (chronic obstructive pulmonary disease)   Chest pain   TIA (transient ischemic attack)   Dysarthria   Stricture and stenosis of esophagus   Discharge Condition:fair  Diet recommendation: dysphagia level 3   Filed Weights   08/03/12 0107 08/03/12 2102 08/04/12 2051  Weight: 94.5 kg (208 lb 5.4 oz) 95.9 kg (211 lb 6.7 oz) 97.7 kg (215 lb 6.2 oz)    History of present illness:  Please refer to admission H&P for details, but in brief, 57 year old female with hx of COPD, hypertension, hypothyroidism and bipolar disorder presented to [facility] with a complaint of slurred speech and confusion which lasted for about an hour after she woke up in the morning. She also had a midsternal chest pain which lasted about an hour. She complained of choking on her food on and off for the past couple of weeks. She also had some wheezing at night. She was diagnosed with pneumonia and admitted with further w/up   Hospital Course:  Community acquired / Aspiration Pneumonia  Likely related to aspiration in relation to her dysphagia and regurgitation.  clinically improving on current empiric abx. Cultures so far negative.  Will discharge on oral augmenting to complete a 7 day course.   Dysphagia  barium esophagram revealed severe dysmotility along with strictures - GI following  Recommend to slowly advance diet as tolerated and will plan on outpt EGD  SLP eval recommends dys level 3 diet . She needs outpt swallow evaluation.  Follow up with GI in 2 weeks  with plan on EGD.  Confusion and slurred speech  ?TIA - MRI / MRA negative for CVA  - recently taken off her her aspirin by her PCP for easy bruising  MRA negative  - carotid dopplers and TTE unrevealing  ASA resumed   ARF  due to dehydration. Now resolved   Hypertension  Stable. Cont home meds   Hyperlipidemia  continue statin   Chronic neck pain  she has a neurosurgical followup as outpt  - continue chronic narcotics and lidocaine patch   Bipolar disorder  On multiple meds. Resumed   COPD (chronic obstructive pulmonary disease)  Stable   Tobacco use  Counseled on cessation   Chest pain  Possibly due to pleurisy on admission. Now resolved   Left leg edema  Doppler LE neg for DVT    Disposition Plan:  Home with outpt GI follow up  Consultants:  [facility]   Procedures:  None   Antibiotics:  Zithromax 8/2 >> 8/3  Rocephin 8/2  Unasyn 8/2 >>     Discharge Exam: Filed Vitals:   08/05/12 1349  BP: 119/76  Pulse: 84  Temp: 98.2 F (36.8 C)  Resp: 19    General: Middle aged female in NAD  HEENT: no pallor, moist oral mucoa  Cardiovascular: NS1&S2, no murmurs  Respiratory: clear b/l , no added sounds  Abdomen:soft, NT, ND, BS+ Ext: warm, no edema  CNS: AAOX3  Discharge Instructions     Medication List  albuterol (2.5 MG/3ML) 0.083% nebulizer solution  Commonly known as:  PROVENTIL  Take 3 mLs (2.5 mg total) by nebulization every 4 (four) hours as needed for wheezing.     albuterol 108 (90 BASE) MCG/ACT inhaler  Commonly known as:  PROVENTIL HFA;VENTOLIN HFA  Inhale 2 puffs into the lungs 3 (three) times daily.     amitriptyline 50 MG tablet  Commonly known as:  ELAVIL  Take 1 tablet (50 mg total) by mouth at bedtime.     amoxicillin-clavulanate 875-125 MG per tablet  Commonly known as:  AUGMENTIN  Take 1 tablet by mouth 2 (two) times daily. UNTIL 8/10     busPIRone 15 MG tablet  Commonly known as:  BUSPAR  Take 1 tablet (15  mg total) by mouth 3 (three) times daily.     cyanocobalamin 500 MCG tablet  Take 500 mcg by mouth daily.     diphenhydramine-acetaminophen 25-500 MG Tabs  Commonly known as:  TYLENOL PM  Take 1 tablet by mouth at bedtime as needed.     DULoxetine 30 MG capsule  Commonly known as:  CYMBALTA  Take 1 capsule (30 mg total) by mouth daily.     fish oil-omega-3 fatty acids 1000 MG capsule  Take 1 g by mouth every morning.     guaiFENesin 600 MG 12 hr tablet  Commonly known as:  MUCINEX  Take 1 tablet (600 mg total) by mouth 2 (two) times daily.     hydrochlorothiazide 25 MG tablet  Commonly known as:  HYDRODIURIL  Take 1 tablet (25 mg total) by mouth daily. For blood pressure and swelling     lidocaine 5 %  Commonly known as:  LIDODERM  Place 1 patch onto the skin every 12 (twelve) hours. Remove & Discard patch within 12 hours or as directed by MD     losartan 50 MG tablet  Commonly known as:  COZAAR  Take 1 tablet (50 mg total) by mouth daily.     oxyCODONE-acetaminophen 10-325 MG per tablet  Commonly known as:  PERCOCET  Take 1 tablet by mouth every 4 (four) hours.     pravastatin 40 MG tablet  Commonly known as:  PRAVACHOL  Take 40 mg by mouth every evening.     propranolol 10 MG tablet  Commonly known as:  INDERAL  Take 2 tablets (20 mg total) by mouth 3 (three) times daily.     tiotropium 18 MCG inhalation capsule  Commonly known as:  SPIRIVA HANDIHALER  Place 1 capsule (18 mcg total) into inhaler and inhale daily.     ziprasidone 80 MG capsule  Commonly known as:  GEODON  Take 1 capsule (80 mg total) by mouth 2 (two) times daily with a meal.       Allergies  Allergen Reactions  . Codeine Anaphylaxis  . Cyclobenzaprine Anaphylaxis  . Darvocet (Propoxyphene-Acetaminophen) Anaphylaxis  . Abilify (Aripiprazole) Other (See Comments)    tremors  . Adhesive (Tape) Hives  . Dilaudid (Hydromorphone Hcl) Nausea And Vomiting    Only the pills.   . Iodine Hives   . Levofloxacin Hives  . Prednisone Hives  . Sulfa Antibiotics Itching       Follow-up Information   Follow up with Milinda Antis, MD In 1 week.   Contact information:   [address] [place] [address] [phone]        The results of significant diagnostics from this hospitalization (including imaging, microbiology, ancillary and laboratory) are  listed below for reference.    Significant Diagnostic Studies: Dg Chest 2 View  08/02/2012   *RADIOLOGY REPORT*  Clinical Data: Chest pain  CHEST - 2 VIEW  Comparison: 02/25/2011  Findings: The heart and pulmonary vascularity are within normal limits.  Some mild increased density is noted in the right lung base consistent with early infiltrate. No bony abnormality is noted.  IMPRESSION: Early right basilar infiltrate.   Original Report Authenticated By: Alcide Clever, M.D.   Ct Head Wo Contrast  08/02/2012   *RADIOLOGY REPORT*  Clinical Data: 57 year old female with slurred speech, confusion and bilateral arm numbness.  CT HEAD WITHOUT CONTRAST  Technique:  Contiguous axial images were obtained from the base of the skull through the vertex without contrast.  Comparison: 07/28/2009 head CT  Findings: No intracranial abnormalities are identified, including mass lesion or mass effect, hydrocephalus, extra-axial fluid collection, midline shift, hemorrhage, or acute infarction.  The visualized bony calvarium is unremarkable.  IMPRESSION: Unremarkable noncontrast head CT.   Original Report Authenticated By: Harmon Pier, M.D.   Ct Chest Wo Contrast  08/02/2012   *RADIOLOGY REPORT*  Clinical Data: 57 year old female with chest pain and bilateral arm numbness.  CT CHEST WITHOUT CONTRAST  Technique:  Multidetector CT imaging of the chest was performed following the standard protocol without IV contrast, which was not administered secondary to patient's renal insufficiency.  Comparison: 03/12/2010 chest CT  Findings: Mild to moderate coronary  artery calcifications again noted. The heart and great vessels are unchanged and within normal limits otherwise. There is no evidence of thoracic aortic aneurysm.  No pleural or pericardial effusions are identified. There are no enlarged lymph nodes.  Tree in bud opacities throughout the right lung are compatible with infection. The left lung is clear. There is no evidence of consolidation or pulmonary mass.  No acute or suspicious bony abnormalities are identified. Punctate nonobstructing left upper pole renal calculi are identified. The patient is status post cholecystectomy.  IMPRESSION: Tree in bud opacities throughout the right lung compatible with infection.  Coronary artery disease.  Punctate nonobstructing left upper pole renal calculi.   Original Report Authenticated By: Harmon Pier, M.D.   Mr [facility] Wo Contrast  08/03/2012   *RADIOLOGY REPORT*  Clinical Data:  TIA.  Episode of slurred speech and upper extremity weakness.  Confusion.  MRI HEAD WITHOUT CONTRAST MRA HEAD WITHOUT CONTRAST  Technique:  Multiplanar, multiecho pulse sequences of the brain and surrounding structures were obtained without intravenous contrast. Angiographic images of the head were obtained using MRA technique without contrast.  Comparison:  CT head without contrast 08/02/2012 at [facility].  MRI brain 02/16/2005 at [facility].  MRI HEAD  Findings:  The diffusion weighted images demonstrate no evidence for acute or subacute infarction.  No hemorrhage or mass lesion is present.  The ventricles are of normal size.  No significant white matter disease is evident.  There is no significant extra-axial fluid collection.  Flow is present in the major intracranial arteries.  The globes and orbits are intact.  The paranasal sinuses and mastoid air cells are clear.  IMPRESSION: Negative MRI of the brain.  MRA HEAD  Findings: The internal carotid arteries are within normal limits from high cervical segments through  the external 90.  The A1 and M1 segments are normal.  No definite anterior communicating artery is present.  The MCA bifurcations are within normal limits.  The ACA and MCA branch vessels are normal.  The left vertebral artery  is slightly dominant to the right.  The PICA origins are visualized and within normal limits bilaterally. The basilar artery is normal.  Both posterior cerebral arteries originate from the basilar tip.  PCA branch vessels are within normal limits.  IMPRESSION: Normal variant MRA circle of Willis without evidence for significant proximal stenosis, aneurysm, or branch vessel occlusion.   Original Report Authenticated By: Marin Roberts, M.D.   Mr Brain Wo Contrast  08/03/2012   *RADIOLOGY REPORT*  Clinical Data:  TIA.  Episode of slurred speech and upper extremity weakness.  Confusion.  MRI HEAD WITHOUT CONTRAST MRA HEAD WITHOUT CONTRAST  Technique:  Multiplanar, multiecho pulse sequences of the brain and surrounding structures were obtained without intravenous contrast. Angiographic images of the head were obtained using MRA technique without contrast.  Comparison:  CT head without contrast 08/02/2012 at [facility].  MRI brain 02/16/2005 at [facility].  MRI HEAD  Findings:  The diffusion weighted images demonstrate no evidence for acute or subacute infarction.  No hemorrhage or mass lesion is present.  The ventricles are of normal size.  No significant white matter disease is evident.  There is no significant extra-axial fluid collection.  Flow is present in the major intracranial arteries.  The globes and orbits are intact.  The paranasal sinuses and mastoid air cells are clear.  IMPRESSION: Negative MRI of the brain.  MRA HEAD  Findings: The internal carotid arteries are within normal limits from high cervical segments through the external 90.  The A1 and M1 segments are normal.  No definite anterior communicating artery is present.  The MCA bifurcations are  within normal limits.  The ACA and MCA branch vessels are normal.  The left vertebral artery is slightly dominant to the right.  The PICA origins are visualized and within normal limits bilaterally. The basilar artery is normal.  Both posterior cerebral arteries originate from the basilar tip.  PCA branch vessels are within normal limits.  IMPRESSION: Normal variant MRA circle of Willis without evidence for significant proximal stenosis, aneurysm, or branch vessel occlusion.   Original Report Authenticated By: Marin Roberts, M.D.   Dg Esophagus  08/03/2012   *RADIOLOGY REPORT*  Clinical data:  Dysphagia with sensation of solid foods and liquid sticking in the upper esophagus or pharynx over the past 2 weeks.  ESOPHOGRAM / BARIUM SWALLOW  Technique:  Combined double contrast and single contrast examination performed using effervescent crystals, thick barium liquid, and thin barium liquid.  Comparison: CT chest yesterday.  No prior fluoroscopic studies.  Findings:  Patient swallowed the thick and thin barium liquid without difficulty.  Severe esophageal dysmotility is present, with essentially absent primary peristaltic wave and numerous tertiary esophageal contractions throughout the examination.  With the patient in the prone RAO position, none of the ingested barium passed through the esophagus.  However, with the patient erect, the esophagus did empty.  There is evidence of a stricture at the esophagogastric junction which did not allow passage of 12.5 mm barium tablet.  There is no evidence of hiatal hernia.  Gastroesophageal reflux was not elicited despite use of the water siphon maneuver.  No esophageal masses were identified.  Evaluation of the pharynx during rapid sequence imaging with swallows of thin barium liquid demonstrated spasm in the cricopharyngeus muscle versus a posterior pharyngeal web which did cause significant narrowing of the lower pharynx/upper esophagus. Note was made of laryngeal  penetration, but there is no evidence of frank tracheal aspiration.  Fluoroscopy time:  2 minutes, 22 seconds, pulsed fluoroscopy  IMPRESSION:  1.  Severe esophageal dysmotility. 2.  Stricture at the esophagogastric junction which did not allow passage of a 12.5 mm barium tablet.  No evidence of hiatal hernia. 3.  Stricture at the junction of the pharynx in the upper esophagus which is due to cricopharyngeus muscle spasm and/or a lower pharyngeal web. 4.  Laryngeal penetration with swallows of thin barium liquid.  No evidence of frank tracheal aspiration.  Preliminary results were discussed with the patient at the time of the examination.   Original Report Authenticated By: Hulan Saas, M.D.   Dg Swallowing Func-speech Pathology  08/05/2012   Lenor Derrick, CCC-SLP     08/05/2012  1:19 PM Objective Swallowing Evaluation: Modified Barium Swallowing Study   Patient Details  Name: MARDI CANNADY MRN: [id number] Date of Birth: 03/12/1955  Today's Date: 08/05/2012 Time: 1150-1230 SLP Time Calculation (min): 40 min  Past Medical History:  Past Medical History  Diagnosis Date  . COPD (chronic obstructive pulmonary disease)   . Depression   . Chronic back pain     DDD, disc bulge, radiculopathy, spinal stenosis  . Hyperlipidemia   . Anxiety   . Bipolar disorder, unspecified   . Carotid artery calcification   . Obsessive-compulsive disorder   . DDD (degenerative disc disease), lumbosacral   . Spinal stenosis, lumbar   . Melanoma     removed 1 week ago at Dr Laurena Slimmer office  . Thyroid disease   . Autoimmune thyroiditis    Past Surgical History:  Past Surgical History  Procedure Laterality Date  . Appendectomy    . Tonsillectomy    . Cholecystectomy    . Lump left breast      removed- benign  . Abdominal hysterectomy      tubal pregnancy  . Inguinal hernia repair    . Stomach surgery      ?holes in esophgous  . Ectopic pregnancy surgery    . Back surgery      x 5;1984;1989;;1999;2000;2010  . Colonoscopy  01/25/2012    Procedure:  COLONOSCOPY;  Surgeon: Malissa Hippo, MD;   Location: [facility];  Service: Endoscopy;  Laterality: N/A;   130   HPI:  A 57 year old female with COPD, hypertension, hypothyroidism and  bipolar disorder. The patient presented to [facility] with a complaint of slurred speech and confusion which  lasted for about an hour after she woke up in the morning. She  also had other symptoms such as midsternal chest pain which  lasted about an hour. She complains about choking on her food on  and off for the past couple of weeks. And had also noticed some  wheezing at night. She was diagnosed with pneumonia and admitted  for treatment of this and further workup of other symptoms. She  has had complaints of palpitations and was referred with the  suspicion of pheochromocytoma to outpatient endocrine     Assessment / Plan / Recommendation Clinical Impression  Dysphagia Diagnosis: Suspected primary esophageal dysphagia Clinical impression: Pt. has only a mild pharyngeal dysphagia,  with a what appears to be a moderate primary esophageal  dysphagia, characterized by intermittent delayed swallow  initiation (to pyriforms) with thin liquids, and only one  instance of flash/trace penetration into the laryngeal vestibule,  which immediately cleared.  The UES appeared to function  normally, and there was no residue of any consistency in the  valleculae, pyriforms, or on the UES.  The esophageal sweep  revealed what appeared to be a narrowing at the GE juncture,  backflow of liquids within the esophagus, and tertiary  contractions.  The esophagus eventually emptied, other than the  12.74mm barium tablet, which remained at the GE juncture.  No  radiologist was present to confirm these observations.  Question  if dilitation would be appropriate for this patient?  Pt. is  being followed by GI.    Treatment Recommendation  No treatment recommended at this time (Defer treatment plan to  GI)    Diet Recommendation Dysphagia  3 (Mechanical Soft);Thin liquid   Liquid Administration via: Straw;Cup Medication Administration: Whole meds with liquid (Break in half  or crush if large) Supervision: Patient able to self feed;Full supervision/cueing  for compensatory strategies Compensations: Slow rate;Small sips/bites;Follow solids with  liquid Postural Changes and/or Swallow Maneuvers: Seated upright 90  degrees;Upright 30-60 min after meal    Other  Recommendations Oral Care Recommendations: Oral care QID Other Recommendations: Clarify dietary restrictions   Follow Up Recommendations  None    Frequency and Duration        Pertinent Vitals/Pain n/a        General HPI: A 57 year old female with COPD, hypertension,  hypothyroidism and bipolar disorder. The patient presented to  [facility] with a complaint of slurred speech and  confusion which lasted for about an hour after she woke up in the  morning. She also had other symptoms such as midsternal chest  pain which lasted about an hour. She complains about choking on  her food on and off for the past couple of weeks. And had also  noticed some wheezing at night. She was diagnosed with pneumonia  and admitted for treatment of this and further workup of other  symptoms. She has had complaints of palpitations and was referred  with the suspicion of pheochromocytoma to outpatient endocrine Type of Study: Modified Barium Swallowing Study Reason for Referral: Objectively evaluate swallowing function Previous Swallow Assessment: Bse 08/04/12 Diet Prior to this Study: Dysphagia 1 (puree);Thin liquids Temperature Spikes Noted: No Respiratory Status: Room air History of Recent Intubation: No Behavior/Cognition: Alert;Cooperative;Pleasant mood Oral Cavity - Dentition: Adequate natural dentition Oral Motor / Sensory Function: Within functional limits Self-Feeding Abilities: Able to feed self Patient Positioning: Upright in chair Baseline Vocal Quality: Clear;Hoarse Volitional Cough: Strong  Volitional Swallow: Able to elicit Anatomy: Within functional limits (s/p [employer] fundiplication) Pharyngeal Secretions: Not observed secondary MBS    Reason for Referral Objectively evaluate swallowing function   Oral Phase Oral Preparation/Oral Phase Oral Phase: WFL   Pharyngeal Phase Pharyngeal Phase Pharyngeal Phase: Within functional limits  Cervical Esophageal Phase    GO    Cervical Esophageal Phase Cervical Esophageal Phase: Vicente Masson T 08/05/2012, 1:19 PM     Microbiology: Recent Results (from the past 240 hour(s))  MRSA PCR SCREENING     Status: None   Collection Time    08/03/12  1:34 AM      Result Value Range Status   MRSA by PCR NEGATIVE  NEGATIVE Final   Comment:            The GeneXpert MRSA Assay (FDA     approved for NASAL specimens     only), is one component of a     comprehensive MRSA colonization     surveillance program. It is not     intended to diagnose MRSA  infection nor to guide or     monitor treatment for     MRSA infections.  CULTURE, BLOOD (ROUTINE X 2)     Status: None   Collection Time    08/03/12  2:00 AM      Result Value Range Status   Specimen Description RIGHT ANTECUBITAL BLOOD   Final   Special Requests BOTTLES DRAWN AEROBIC ONLY 10CC   Final   Culture  Setup Time 08/03/2012 15:06   Final   Culture     Final   Value:        BLOOD CULTURE RECEIVED NO GROWTH TO DATE CULTURE WILL BE HELD FOR 5 DAYS BEFORE ISSUING A FINAL NEGATIVE REPORT   Report Status PENDING   Incomplete  CULTURE, BLOOD (ROUTINE X 2)     Status: None   Collection Time    08/03/12  2:10 AM      Result Value Range Status   Specimen Description BLOOD RIGHT ARM   Final   Special Requests BOTTLES DRAWN AEROBIC ONLY 5CC   Final   Culture  Setup Time 08/03/2012 15:05   Final   Culture     Final   Value:        BLOOD CULTURE RECEIVED NO GROWTH TO DATE CULTURE WILL BE HELD FOR 5 DAYS BEFORE ISSUING A FINAL NEGATIVE REPORT   Report Status PENDING   Incomplete      Labs: Basic Metabolic Panel:  Recent Labs Lab 08/02/12 1855 08/03/12 0210 08/04/12 0515 08/05/12 0500  NA 133* 135 141 139  K 3.9 3.6 4.2 4.5  CL 95* 102 106 105  CO2 27 25 29 29   GLUCOSE 104* 89 91 88  BUN 38* 27* 14 9  CREATININE 1.90* 1.24* 0.91 0.87  CALCIUM 10.2 8.3* 8.5 8.6   Liver Function Tests:  Recent Labs Lab 08/03/12 0210  AST 20  ALT 12  ALKPHOS 47  BILITOT 0.2*  PROT 6.2  ALBUMIN 2.9*   No results found for this basename: LIPASE, AMYLASE,  in the last 168 hours No results found for this basename: AMMONIA,  in the last 168 hours CBC:  Recent Labs Lab 08/02/12 1855 08/03/12 0210 08/05/12 0500  WBC 15.1* 11.7* 4.9  NEUTROABS 12.7* 9.2*  --   HGB 13.3 12.0 10.6*  HCT 39.6 35.2* 32.0*  MCV 94.5 93.1 95.0  PLT 259 228 208   Cardiac Enzymes:  Recent Labs Lab 08/02/12 1855 08/03/12 0213 08/03/12 0850 08/03/12 1410 08/05/12 0500  TROPONINI <0.30 <0.30 <0.30 0.32* <0.30   BNP: BNP (last 3 results)  Recent Labs  08/03/12 0850  PROBNP 745.1*   CBG:  Recent Labs Lab 08/02/12 1853 08/03/12 2059 08/04/12 0720 08/04/12 1008 08/05/12 0723  GLUCAP 123* 117* 78 78 85       Signed:  Maven Varelas  [facility] 08/05/2012, 2:46 PM

## 2012-08-05 NOTE — Progress Notes (Signed)
[facility] PROGRESS NOTE  Tonya Sullivan ZOX:[id number] DOB: 1955-04-10 DOA: 08/02/2012 PCP: Milinda Antis, MD   Brief narrative  57 year old female with hx of COPD, hypertension, hypothyroidism and bipolar disorder presented to [facility] with a complaint of slurred speech and confusion which lasted for about an hour after she woke up in the morning. She also had a midsternal chest pain which lasted about an hour. She complained of  choking on her food on and off for the past couple of weeks. She also had some wheezing at night. She was diagnosed with pneumonia and admitted with further w/up  Assessment/Plan:  Community acquired Pneumonia  Likely  related to aspiration in relation to her dysphagia and regurgitation.  clinically improving on current empiric abx. Cultures so far negative.  Dysphagia  barium esophagram revealed severe dysmotility along with strictures - GI following  Recommend to slowly advance diet as tolerated and will plan on outpt EGD SLP eval recommends dys level 1 diet which she is tolerating well. will reassess.   Confusion and slurred speech  ?TIA - MRI / MRA negative for CVA  - recently taken off her her aspirin by her PCP for easy bruising   MRA negative  - carotid dopplers and TTE unrevealing ASA resumed   ARF  due to dehydration. Now resolved  Hypertension  Stable. Cont home meds  Hyperlipidemia  continue statin   Chronic neck pain  she has a neurosurgical followup as outpt  - continue chronic narcotics and lidocaine patch   Bipolar disorder  On multiple meds. Resumed  COPD (chronic obstructive pulmonary disease)  Stable  Tobacco use  Counseled on cessation  Chest pain  Possibly due to pleurisy on admission. Now resolved  Left leg edema  Doppler LE neg for DVT  DVT prophylaxis:  Sq Heparin   Disposition Plan:  Home tomorrow if able to tolerate advanced diet with outpt GI f/up  Consultants:  [facility]    Procedures:  None   Antibiotics:  Zithromax 8/2 >> 8/3  Rocephin 8/2  Unasyn 8/2 >>    HPI/Subjective:  Reports speech to be better. Tolerating dysphagia diet   Objective: Filed Vitals:   08/05/12 0906  BP: 114/61  Pulse: 96  Temp: 97.5 F (36.4 C)  Resp: 18    Intake/Output Summary (Last 24 hours) at 08/05/12 1311 Last data filed at 08/05/12 0906  Gross per 24 hour  Intake 1869.16 ml  Output      0 ml  Net 1869.16 ml   Filed Weights   08/03/12 0107 08/03/12 2102 08/04/12 2051  Weight: 94.5 kg (208 lb 5.4 oz) 95.9 kg (211 lb 6.7 oz) 97.7 kg (215 lb 6.2 oz)    Exam:   General:  Middle aged female in NAD  HEENT: no pallor, moist oral mucoa  Cardiovascular: NS1&S2, no murmurs  Respiratory: clear b/l , no added sounds  Abdomen:soft, NT, ND, BS+  Ext: warm, no edema   CNS: AAOX3    Data Reviewed: Basic Metabolic Panel:  Recent Labs Lab 08/02/12 1855 08/03/12 0210 08/04/12 0515 08/05/12 0500  NA 133* 135 141 139  K 3.9 3.6 4.2 4.5  CL 95* 102 106 105  CO2 27 25 29 29   GLUCOSE 104* 89 91 88  BUN 38* 27* 14 9  CREATININE 1.90* 1.24* 0.91 0.87  CALCIUM 10.2 8.3* 8.5 8.6   Liver Function Tests:  Recent Labs Lab 08/03/12 0210  AST 20  ALT 12  ALKPHOS 47  BILITOT 0.2*  PROT 6.2  ALBUMIN 2.9*   No results found for this basename: LIPASE, AMYLASE,  in the last 168 hours No results found for this basename: AMMONIA,  in the last 168 hours CBC:  Recent Labs Lab 08/02/12 1855 08/03/12 0210 08/05/12 0500  WBC 15.1* 11.7* 4.9  NEUTROABS 12.7* 9.2*  --   HGB 13.3 12.0 10.6*  HCT 39.6 35.2* 32.0*  MCV 94.5 93.1 95.0  PLT 259 228 208   Cardiac Enzymes:  Recent Labs Lab 08/02/12 1855 08/03/12 0213 08/03/12 0850 08/03/12 1410 08/05/12 0500  TROPONINI <0.30 <0.30 <0.30 0.32* <0.30   BNP (last 3 results)  Recent Labs  08/03/12 0850  PROBNP 745.1*   CBG:  Recent Labs Lab 08/02/12 1853 08/03/12 2059 08/04/12 0720  08/04/12 1008 08/05/12 0723  GLUCAP 123* 117* 78 78 85    Recent Results (from the past 240 hour(s))  MRSA PCR SCREENING     Status: None   Collection Time    08/03/12  1:34 AM      Result Value Range Status   MRSA by PCR NEGATIVE  NEGATIVE Final   Comment:            The GeneXpert MRSA Assay (FDA     approved for NASAL specimens     only), is one component of a     comprehensive MRSA colonization     surveillance program. It is not     intended to diagnose MRSA     infection nor to guide or     monitor treatment for     MRSA infections.  CULTURE, BLOOD (ROUTINE X 2)     Status: None   Collection Time    08/03/12  2:00 AM      Result Value Range Status   Specimen Description RIGHT ANTECUBITAL BLOOD   Final   Special Requests BOTTLES DRAWN AEROBIC ONLY 10CC   Final   Culture  Setup Time 08/03/2012 15:06   Final   Culture     Final   Value:        BLOOD CULTURE RECEIVED NO GROWTH TO DATE CULTURE WILL BE HELD FOR 5 DAYS BEFORE ISSUING A FINAL NEGATIVE REPORT   Report Status PENDING   Incomplete  CULTURE, BLOOD (ROUTINE X 2)     Status: None   Collection Time    08/03/12  2:10 AM      Result Value Range Status   Specimen Description BLOOD RIGHT ARM   Final   Special Requests BOTTLES DRAWN AEROBIC ONLY 5CC   Final   Culture  Setup Time 08/03/2012 15:05   Final   Culture     Final   Value:        BLOOD CULTURE RECEIVED NO GROWTH TO DATE CULTURE WILL BE HELD FOR 5 DAYS BEFORE ISSUING A FINAL NEGATIVE REPORT   Report Status PENDING   Incomplete     Studies: No results found.  Scheduled Meds: . albuterol  2 puff Inhalation TID  . amitriptyline  50 mg Oral QHS  . ampicillin-sulbactam (UNASYN) IV  3 g Intravenous Q8H  . aspirin EC  325 mg Oral Daily  . busPIRone  15 mg Oral TID  . DULoxetine  30 mg Oral Daily  . guaiFENesin  600 mg Oral BID  . heparin  5,000 Units Subcutaneous Q8H  . lidocaine  1 patch Transdermal Q24H  . pneumococcal 23 valent vaccine  0.5 mL  Intramuscular Tomorrow-1000  . simvastatin  20 mg Oral q1800  . sodium chloride  3 mL Intravenous Q12H  . tiotropium  18 mcg Inhalation Daily  . ziprasidone  80 mg Oral BID WC   Continuous Infusions: . sodium chloride 50 mL/hr at 08/04/12 1736      Time spent: 25 minutes    Facundo Allemand  [facility] Pager [phone] If 7PM-7AM, please contact night-coverage at [email or web address], password [facility] 08/05/2012, 1:11 PM  LOS: 3 days

## 2012-08-05 NOTE — Progress Notes (Signed)
VASCULAR LAB PRELIMINARY  PRELIMINARY  PRELIMINARY  PRELIMINARY  Left lower extremity venous duplex completed.    Preliminary report:  Left:  No evidence of DVT, superficial thrombosis, or Baker's cyst.  Umi Mainor, RVT 08/05/2012, 10:39 AM

## 2012-08-06 ENCOUNTER — Ambulatory Visit (INDEPENDENT_AMBULATORY_CARE_PROVIDER_SITE_OTHER): Payer: Medicare Other | Admitting: Psychiatry

## 2012-08-06 ENCOUNTER — Telehealth: Payer: Self-pay | Admitting: Family Medicine

## 2012-08-06 DIAGNOSIS — R1310 Dysphagia, unspecified: Secondary | ICD-10-CM

## 2012-08-06 DIAGNOSIS — K222 Esophageal obstruction: Secondary | ICD-10-CM

## 2012-08-06 DIAGNOSIS — F319 Bipolar disorder, unspecified: Secondary | ICD-10-CM

## 2012-08-06 NOTE — Telephone Encounter (Signed)
Pt aware.

## 2012-08-06 NOTE — Telephone Encounter (Signed)
Referral placed to Dr. Dulce Sellar

## 2012-08-07 ENCOUNTER — Ambulatory Visit (INDEPENDENT_AMBULATORY_CARE_PROVIDER_SITE_OTHER): Payer: Self-pay | Admitting: Internal Medicine

## 2012-08-07 NOTE — Patient Instructions (Signed)
Discussed orally 

## 2012-08-07 NOTE — Progress Notes (Signed)
Patient:  Tonya Sullivan   DOB: 07-13-1955  MR Number: [id number]  [facility]:  [address] [place] [place],  [place], [address]  Start: Wednesday 08/06/2012 1:10  PM  Wednesday 08/05/2012 1:55  PM End:   Provider/Observer:     Florencia Reasons, MSW, LCSW   Chief Complaint:      Chief Complaint  Patient presents with  . Anxiety    Reason For Service:     The patient was referred for services by psychiatrist Dr. Lolly Mustache due to patient experiencing anxiety, stress, depression, and mood swings. She has a long-standing history of depression beginning in adolescence. Patient also has significant trauma history. Patient also experienced grief and loss issues due to to the death of her husband in 2010-09-17. Patient is seen today for follow up appointment  Interventions Strategy:  Supportive therapy,  cognitive behavioral therapy  Participation Level:   Active  Participation Quality:  Appropriate      Behavioral Observation:  Fairly groomed, Alert, talkative  Current Psychosocial Factors: Patient reports recent hospitalization and increased emotional support from her son and his girlfriend.  Content of Session:   Reviewing symptoms, processing feelings, identifying ways to improve assertiveness skills, identifying coping statements  Current Status:   Patient reports decreased anxiety and worry along with  improved mood.  Patient Progress:    The patient reports she was just discharged from the hospital after being there for 4 days due to suffering from a mild stroke, pneumonia, a bladder infection, and problems with her esophagus. She is pleased that she has received strong emotional support from her son and his girlfriend and  plans to move in  with son and his family this weekend. She reports decreased anxiety regarding the move as she talked with her son's girlfriend regarding her concerns. She also has kept her options open to move into an apartment if this arrangement does  not work. Therapist reinforces patient's efforts to improve assertiveness skills and to set and maintain boundaries.   Target Goals:   1. Improving mood as evidenced by resuming normal interest in activities and increased social involvement. 2. Increase self acceptance and improve self-esteem as evidenced by patient making positive statements about self. 3.  Improve coping and relaxation techniques. 4. Process and resolve grief and loss issues.   Last Reviewed:   04/06/2011  Goals Addressed Today:    Goal  3  Impression/Diagnosis:   The patient presents with a long-standing history of recurrent depression, mood swings, and a significant trauma history.  Diagnosis: Major Depressive Disorder  Diagnosis:  Axis I:  Bipolar Disorder           Axis II: Deferred

## 2012-08-08 ENCOUNTER — Encounter (INDEPENDENT_AMBULATORY_CARE_PROVIDER_SITE_OTHER): Payer: Self-pay | Admitting: Internal Medicine

## 2012-08-08 ENCOUNTER — Ambulatory Visit (INDEPENDENT_AMBULATORY_CARE_PROVIDER_SITE_OTHER): Payer: Medicare Other | Admitting: Internal Medicine

## 2012-08-08 ENCOUNTER — Ambulatory Visit (INDEPENDENT_AMBULATORY_CARE_PROVIDER_SITE_OTHER): Payer: Medicare Other | Admitting: Family Medicine

## 2012-08-08 VITALS — BP 140/80 | HR 78 | Temp 97.9°F | Resp 16 | Wt 212.0 lb

## 2012-08-08 VITALS — BP 112/66 | HR 60 | Temp 98.2°F | Ht 67.0 in | Wt 209.4 lb

## 2012-08-08 DIAGNOSIS — I5189 Other ill-defined heart diseases: Secondary | ICD-10-CM

## 2012-08-08 DIAGNOSIS — R1310 Dysphagia, unspecified: Secondary | ICD-10-CM

## 2012-08-08 DIAGNOSIS — I10 Essential (primary) hypertension: Secondary | ICD-10-CM

## 2012-08-08 DIAGNOSIS — J690 Pneumonitis due to inhalation of food and vomit: Secondary | ICD-10-CM

## 2012-08-08 DIAGNOSIS — R609 Edema, unspecified: Secondary | ICD-10-CM

## 2012-08-08 DIAGNOSIS — I519 Heart disease, unspecified: Secondary | ICD-10-CM

## 2012-08-08 LAB — CBC WITH DIFFERENTIAL/PLATELET
Basophils Relative: 0 % (ref 0–1)
Eosinophils Absolute: 0.3 10*3/uL (ref 0.0–0.7)
Eosinophils Relative: 5 % (ref 0–5)
Hemoglobin: 12 g/dL (ref 12.0–15.0)
MCH: 31.1 pg (ref 26.0–34.0)
MCHC: 34 g/dL (ref 30.0–36.0)
MCV: 91.5 fL (ref 78.0–100.0)
Monocytes Relative: 7 % (ref 3–12)
Neutrophils Relative %: 56 % (ref 43–77)

## 2012-08-08 LAB — BASIC METABOLIC PANEL
BUN: 7 mg/dL (ref 6–23)
CO2: 29 mEq/L (ref 19–32)
Chloride: 105 mEq/L (ref 96–112)
Creat: 1.19 mg/dL — ABNORMAL HIGH (ref 0.50–1.10)
Potassium: 5 mEq/L (ref 3.5–5.3)

## 2012-08-08 LAB — BRAIN NATRIURETIC PEPTIDE: Brain Natriuretic Peptide: 253.7 pg/mL — ABNORMAL HIGH (ref 0.0–100.0)

## 2012-08-08 MED ORDER — FUROSEMIDE 20 MG PO TABS: 20.0000 mg | ORAL_TABLET | Freq: Every day | ORAL | Status: DC

## 2012-08-08 NOTE — Progress Notes (Signed)
Subjective:     Patient ID: Tonya Sullivan, female   DOB: [birth year], 57 y.o.   MRN: [id number]  HPI Presents today with c/o that foods are lodging in her esophagus. She tells me foods and liquids are lodging in her esophagus. She has had symptoms for about a month. She also tells me she had a minor stroke 4 days before going into the hospital. Recent patient at [facility] 08/02/2012. She actually presented to [facility] and was transferred to [facility]. MRI brain  was negative for injury.  She was treated for pneumonia while in the hospital and treated with Azithromycin.  Appetite is good. No weight loss. She tells me she has actually gained weight. She usually has a BM x 1 a day. No abdominal pain. She does c/o lower extremity swelling and is seeing Dr. Jeanice Lim for this today.  She denies acid reflux.   08/03/2012 DG Esophagus:  1. Severe esophageal dysmotility.  2. Stricture at the esophagogastric junction which did not allow  passage of a 12.5 mm barium tablet. No evidence of hiatal hernia.  3. 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  4. Laryngeal penetration with swallows of thin barium liquid. No  evidence of frank tracheal aspiration.  Preliminary results were discussed with the patient at the time of  the examination.  08/02/2012 Speech Pathology / Plan / Recommendation  Clinical Impression  Dysphagia Diagnosis: Suspected primary esophageal dysphagia  Clinical impression: Pt. has only a mild pharyngeal dysphagia, with a what appears to be a moderate primary esophageal dysphagia, characterized by intermittent delayed swallow initiation (to pyriforms) with thin liquids, and only one instance of flash/trace penetration into the laryngeal vestibule, which immediately cleared. The UES appeared to function normally, and there was no residue of any consistency in the valleculae,  pyriforms, or on the UES. The esophageal sweep revealed what appeared to be a narrowing at the GE juncture, backflow of liquids within the esophagus, and tertiary contractions. The esophagus eventually emptied, other than the 12.63mm barium tablet, which remained at the GE juncture. No radiologist was present to confirm these observations. Question if dilitation would be appropriate for this patient    MRI Brain 08/03/2012: Negative.  01/2012 Colonoscopy Dr. Karilyn Cota: Impression:  Examination performed to cecum.  Mild sigmoid colon diverticulosis otherwise normal colonoscopy.  Recommendations:  Standard instructions given.  Levsin SL one tablet 4 times a day when necessary.  Next screening exam in 10 years.  Patient will review her abdominal symptoms when she is seen at the pain clinic later this month.     Review of Systems see hpi Current Outpatient Prescriptions  Medication Sig Dispense Refill  . albuterol (PROVENTIL HFA;VENTOLIN HFA) 108 (90 BASE) MCG/ACT inhaler Inhale 2 puffs into the lungs 3 (three) times daily.      Marland Kitchen albuterol (PROVENTIL) (2.5 MG/3ML) 0.083% nebulizer solution Take 3 mLs (2.5 mg total) by nebulization every 4 (four) hours as needed for wheezing.  75 mL  3  . amitriptyline (ELAVIL) 50 MG tablet Take 1 tablet (50 mg total) by mouth at bedtime.  30 tablet  0  . busPIRone (BUSPAR) 15 MG tablet Take 1 tablet (15 mg total) by mouth 3 (three) times daily.  90 tablet  0  . cyanocobalamin 500 MCG tablet Take 500 mcg by mouth daily.      . diphenhydramine-acetaminophen (TYLENOL PM) 25-500 MG  TABS Take 1 tablet by mouth at bedtime as needed.      . DULoxetine (CYMBALTA) 30 MG capsule Take 1 capsule (30 mg total) by mouth daily.  30 capsule  0  . fish oil-omega-3 fatty acids 1000 MG capsule Take 1 g by mouth every morning.       Marland Kitchen guaiFENesin (MUCINEX) 600 MG 12 hr tablet Take 1 tablet (600 mg total) by mouth 2 (two) times daily.  10 tablet  0  . hydrochlorothiazide (HYDRODIURIL)  25 MG tablet Take 1 tablet (25 mg total) by mouth daily. For blood pressure and swelling  30 tablet  1  . lidocaine (LIDODERM) 5 % Place 1 patch onto the skin every 12 (twelve) hours. Remove & Discard patch within 12 hours or as directed by MD  30 patch  2  . losartan (COZAAR) 50 MG tablet Take 1 tablet (50 mg total) by mouth daily.  90 tablet  3  . oxyCODONE-acetaminophen (PERCOCET) 10-325 MG per tablet Take 1 tablet by mouth every 4 (four) hours.      . pravastatin (PRAVACHOL) 40 MG tablet Take 40 mg by mouth every evening.      . propranolol (INDERAL) 10 MG tablet Take 2 tablets (20 mg total) by mouth 3 (three) times daily.  270 tablet  1  . tiotropium (SPIRIVA HANDIHALER) 18 MCG inhalation capsule Place 1 capsule (18 mcg total) into inhaler and inhale daily.  30 capsule  6  . ziprasidone (GEODON) 80 MG capsule Take 1 capsule (80 mg total) by mouth 2 (two) times daily with a meal.  120 capsule  0  . [DISCONTINUED] Gabapentin, PHN, (GRALISE STARTER) 300 & 600 MG MISC Take 300-1,800 mg by mouth as directed. 15 DAY Starter pack doses     DAY 1- 300 mg     DAY 2- 600 mg     DAY 3 to 6 - 900 mg    DAY 7 to 10 - 1200 mg     DAY 11 to 14 - 1500 mg      DAY 15 - 1800 mg        No current facility-administered medications for this visit.   Past Medical History  Diagnosis Date  . COPD (chronic obstructive pulmonary disease)   . Depression   . Chronic back pain     DDD, disc bulge, radiculopathy, spinal stenosis  . Hyperlipidemia   . Anxiety   . Bipolar disorder, unspecified   . Carotid artery calcification   . Obsessive-compulsive disorder   . DDD (degenerative disc disease), lumbosacral   . Spinal stenosis, lumbar   . Melanoma     removed 1 week ago at Dr Laurena Slimmer office  . Thyroid disease   . Autoimmune thyroiditis    Past Surgical History  Procedure Laterality Date  . Appendectomy    . Tonsillectomy    . Cholecystectomy    . Lump left breast      removed- benign  . Abdominal  hysterectomy      tubal pregnancy  . Inguinal hernia repair    . Stomach surgery      ?holes in esophgous  . Ectopic pregnancy surgery    . Back surgery      x 5;1984;1989;;1999;2000;2010  . Colonoscopy  01/25/2012    Procedure: COLONOSCOPY;  Surgeon: Malissa Hippo, MD;  Location: [facility];  Service: Endoscopy;  Laterality: N/A;  130   Allergies  Allergen Reactions  . Codeine Anaphylaxis  . Cyclobenzaprine  Anaphylaxis  . Darvocet (Propoxyphene-Acetaminophen) Anaphylaxis  . Abilify (Aripiprazole) Other (See Comments)    tremors  . Adhesive (Tape) Hives  . Dilaudid (Hydromorphone Hcl) Nausea And Vomiting    Only the pills.   . Iodine Hives  . Levofloxacin Hives  . Prednisone Hives  . Sulfa Antibiotics Itching        Objective:   Physical Exam  Filed Vitals:   08/08/12 0920  BP: 112/66  Pulse: 60  Temp: 98.2 F (36.8 C)  Height: 5\' 7"  (1.702 m)  Weight: 209 lb 6.4 oz (94.983 kg)  Alert and oriented. Skin warm and dry. Oral mucosa is moist.   . Sclera anicteric, conjunctivae is pink. Thyroid not enlarged. No cervical lymphadenopathy. Lungs clear. Heart regular rate and rhythm.  Abdomen is soft. Bowel sounds are positive. No hepatomegaly. No abdominal masses felt. No tenderness. Bruising noted to both arms and abdomen.  1-2+ edema to lower extremities.        Assessment:     Dysphagia to solids and liquids 1 month. Stricture/web needs to be ruled out.     Plan:    EGD/ED. The risks and benefits such as perforation, bleeding, and infection were reviewed with the patient and is agreeable. Samples of Dexilant given to patient x 3 boxes. Avoids meats. Chew your food well.

## 2012-08-08 NOTE — Patient Instructions (Signed)
Stop the HCTZ Take lasix once a day We will call with results Call if your legs get worse  F/U Monday for recheck

## 2012-08-08 NOTE — Patient Instructions (Addendum)
EGD/ED.The risks and benefits such as perforation, bleeding, and infection were reviewed with the patient and is agreeable. No beef or pork. Chew food very well.

## 2012-08-09 LAB — CULTURE, BLOOD (ROUTINE X 2): Culture: NO GROWTH

## 2012-08-10 ENCOUNTER — Encounter: Payer: Self-pay | Admitting: Family Medicine

## 2012-08-10 ENCOUNTER — Telehealth: Payer: Self-pay | Admitting: Family Medicine

## 2012-08-10 DIAGNOSIS — I5189 Other ill-defined heart diseases: Secondary | ICD-10-CM | POA: Insufficient documentation

## 2012-08-10 DIAGNOSIS — R609 Edema, unspecified: Secondary | ICD-10-CM | POA: Insufficient documentation

## 2012-08-10 NOTE — Assessment & Plan Note (Signed)
Start lasix 20mg  daily, recheck Monday, check renal function and lytes

## 2012-08-10 NOTE — Assessment & Plan Note (Signed)
Grade I dysfunction, normal EF, lasix per above BNP to be checked, no cardiorespiratory distress

## 2012-08-10 NOTE — Assessment & Plan Note (Signed)
S/p antibiotics, reviewed hospital discharge resolved

## 2012-08-10 NOTE — Assessment & Plan Note (Signed)
Seen by GI, plan for EGD

## 2012-08-10 NOTE — Assessment & Plan Note (Signed)
BP looks good, hold HCTZ, change to lasix short term

## 2012-08-10 NOTE — Telephone Encounter (Signed)
Pt called Sat 8/9 with no change in leg swelling, no CP, SOB told to increase lasix to 20mg  BID Pt called Sun 8/10 still with leg swelling states ankles look like a softball, did not urinate much with change in dose yesterday, she has taken AM dose already, change PM dose to 40mg  Take 40mg  Monday AM, f/u clinic tomorrow evening

## 2012-08-10 NOTE — Progress Notes (Signed)
  Subjective:    Patient ID: Tonya Sullivan, female    DOB: 10-23-1955, 57 y.o.   MRN: [id number]  HPI Pt here with leg swelling. Recent hospitilization for AMS, concern for TIA with neg workup, also noted to have aspiration pneumonia treated with antibiotics, no intubation required. Seen by GI today Dr. Karilyn Cota though she had requested referral to Dr. Dulce Sellar earlier this week. Planning for EGD in 2 weeks.  Had leg swelling on and off during admission, LE [place] neg for DVT. Echo done for TIA work-up showed grade I diastolic dysfunction with normal EF. MRI/Cartoids negative.  Past few days leg swelling has increased painful to walk.Denies SOB, chest pain.  D/C summary and labs reviewed  Review of Systems  GEN- + fatigue, fever, weight loss,weakness, recent illness HEENT- denies eye drainage, change in vision, nasal discharge, CVS- denies chest pain, palpitations, +;eg swelling RESP- denies SOB, cough, wheeze ABD- denies N/V, change in stools, abd pain GU- denies dysuria, hematuria, dribbling, incontinence Neuro- denies headache, dizziness, syncope, seizure activity      Objective:   Physical Exam GEN- NAD, alert and oriented x3 HEENT- PERRL, EOMI, non injected sclera, pink conjunctiva, MMM, oropharynx clear,  Neck- Supple, no JVD CVS- RRR, no murmur RESP- CTAB EXT- 1+ pitting edema to shins Pulses- Radial, DP-difficult to palpate due to swelling Skin- small ecchymosis on bilat arms, abdomen       Assessment & Plan:

## 2012-08-11 ENCOUNTER — Encounter: Payer: Self-pay | Admitting: Family Medicine

## 2012-08-11 ENCOUNTER — Ambulatory Visit (INDEPENDENT_AMBULATORY_CARE_PROVIDER_SITE_OTHER): Payer: Medicare Other | Admitting: Family Medicine

## 2012-08-11 ENCOUNTER — Other Ambulatory Visit (INDEPENDENT_AMBULATORY_CARE_PROVIDER_SITE_OTHER): Payer: Self-pay | Admitting: *Deleted

## 2012-08-11 ENCOUNTER — Encounter (HOSPITAL_COMMUNITY): Payer: Self-pay | Admitting: Pharmacy Technician

## 2012-08-11 VITALS — BP 110/80 | HR 70 | Temp 98.7°F | Resp 18 | Wt 207.0 lb

## 2012-08-11 DIAGNOSIS — N179 Acute kidney failure, unspecified: Secondary | ICD-10-CM

## 2012-08-11 DIAGNOSIS — R609 Edema, unspecified: Secondary | ICD-10-CM

## 2012-08-11 DIAGNOSIS — R1310 Dysphagia, unspecified: Secondary | ICD-10-CM

## 2012-08-11 NOTE — Assessment & Plan Note (Signed)
Leg swelling much improved, almost resolved.  Will try to return her back to HCTZ starting tomorrow Hold lasix for now Continue elevating feet Check renal function

## 2012-08-11 NOTE — Patient Instructions (Addendum)
Stop the lasix  Restart the HCTZ once a day for fluid  We will call with lab results  F/U 2 months

## 2012-08-11 NOTE — Assessment & Plan Note (Signed)
Noted on labs from Friday and transient during admission Recheck today as on lasix

## 2012-08-11 NOTE — Progress Notes (Signed)
  Subjective:    Patient ID: Tonya Sullivan, female    DOB: 12-03-1955, 57 y.o.   MRN: [id number]  HPI Pt here to f/u leg swelling, BNP was elevated in 200's, started on lasix 20mg . Over the weekend increased to 40mg . Took 40mg  last night and 40mg  this AM with good results. Denies CP, SOB EGD now scheduled for Wed Review of Systems - per above  GEN- denies fatigue, fever, weight loss,weakness, recent illness CVS- denies chest pain, palpitations RESP- denies SOB, cough, wheeze Neuro- denies headache, dizziness, syncope, seizure activity       Objective:   Physical Exam GEN- NAD, alert and oriented x3 HEENT- PERRL, EOMI, non injected sclera, pink conjunctiva, MMM, oropharynx clear,  CVS- RRR, no murmur RESP- CTAB EXT- trace pedal edema Pulses- Radial, DP 2+        Assessment & Plan:

## 2012-08-12 ENCOUNTER — Encounter (HOSPITAL_COMMUNITY): Payer: Self-pay | Admitting: Psychiatry

## 2012-08-12 ENCOUNTER — Ambulatory Visit (INDEPENDENT_AMBULATORY_CARE_PROVIDER_SITE_OTHER): Payer: Medicare Other | Admitting: Psychiatry

## 2012-08-12 ENCOUNTER — Telehealth: Payer: Self-pay | Admitting: Family Medicine

## 2012-08-12 VITALS — Ht 67.0 in | Wt 204.0 lb

## 2012-08-12 DIAGNOSIS — F329 Major depressive disorder, single episode, unspecified: Secondary | ICD-10-CM

## 2012-08-12 DIAGNOSIS — F419 Anxiety disorder, unspecified: Secondary | ICD-10-CM

## 2012-08-12 DIAGNOSIS — F32A Depression, unspecified: Secondary | ICD-10-CM

## 2012-08-12 DIAGNOSIS — R52 Pain, unspecified: Secondary | ICD-10-CM

## 2012-08-12 DIAGNOSIS — F332 Major depressive disorder, recurrent severe without psychotic features: Secondary | ICD-10-CM

## 2012-08-12 DIAGNOSIS — F411 Generalized anxiety disorder: Secondary | ICD-10-CM

## 2012-08-12 LAB — BASIC METABOLIC PANEL
CO2: 33 mEq/L — ABNORMAL HIGH (ref 19–32)
Calcium: 9 mg/dL (ref 8.4–10.5)
Glucose, Bld: 90 mg/dL (ref 70–99)
Potassium: 5 mEq/L (ref 3.5–5.3)
Sodium: 137 mEq/L (ref 135–145)

## 2012-08-12 MED ORDER — CLONAZEPAM 1 MG PO TABS: 1.0000 mg | ORAL_TABLET | Freq: Two times a day (BID) | ORAL | Status: DC

## 2012-08-12 MED ORDER — HYDROCHLOROTHIAZIDE 25 MG PO TABS: 25.0000 mg | ORAL_TABLET | Freq: Every day | ORAL | Status: DC
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
## 2012-08-12 NOTE — Patient Instructions (Signed)
Discontinue and taper propranolol, geodon, buspar and elavil

## 2012-08-12 NOTE — Telephone Encounter (Signed)
HCTZ 25 mg tab 1 QD #30

## 2012-08-12 NOTE — Telephone Encounter (Signed)
Med refilled.

## 2012-08-13 ENCOUNTER — Ambulatory Visit (HOSPITAL_COMMUNITY)
Admission: RE | Admit: 2012-08-13 | Discharge: 2012-08-13 | Disposition: A | Discharge status: Home / Self Care | Payer: Medicare Other | Source: Ambulatory Visit | Attending: Internal Medicine | Admitting: Internal Medicine

## 2012-08-13 ENCOUNTER — Encounter: Payer: Self-pay | Admitting: Family Medicine

## 2012-08-13 ENCOUNTER — Encounter (HOSPITAL_COMMUNITY)
Admission: RE | Disposition: A | Discharge status: Home / Self Care | Payer: Self-pay | Source: Ambulatory Visit | Attending: Internal Medicine

## 2012-08-13 ENCOUNTER — Telehealth: Payer: Self-pay | Admitting: Family Medicine

## 2012-08-13 ENCOUNTER — Encounter (HOSPITAL_COMMUNITY): Payer: Self-pay | Admitting: *Deleted

## 2012-08-13 DIAGNOSIS — R1310 Dysphagia, unspecified: Secondary | ICD-10-CM | POA: Insufficient documentation

## 2012-08-13 DIAGNOSIS — J449 Chronic obstructive pulmonary disease, unspecified: Secondary | ICD-10-CM | POA: Insufficient documentation

## 2012-08-13 DIAGNOSIS — J4489 Other specified chronic obstructive pulmonary disease: Secondary | ICD-10-CM | POA: Insufficient documentation

## 2012-08-13 DIAGNOSIS — K2960 Other gastritis without bleeding: Secondary | ICD-10-CM

## 2012-08-13 HISTORY — PX: ESOPHAGOGASTRODUODENOSCOPY (EGD) WITH ESOPHAGEAL DILATION: SHX5812

## 2012-08-13 SURGERY — ESOPHAGOGASTRODUODENOSCOPY (EGD) WITH ESOPHAGEAL DILATION
Anesthesia: Moderate Sedation

## 2012-08-13 MED ORDER — STERILE WATER FOR IRRIGATION IR SOLN
Status: DC | PRN
  Administered 2012-08-13: 13:00:00

## 2012-08-13 MED ORDER — MEPERIDINE HCL 25 MG/ML IJ SOLN
INTRAMUSCULAR | Status: DC | PRN
  Administered 2012-08-13 (×2): 25 mg via INTRAVENOUS

## 2012-08-13 MED ORDER — MIDAZOLAM HCL 5 MG/5ML IJ SOLN
INTRAMUSCULAR | Status: AC
  Filled 2012-08-13: qty 20

## 2012-08-13 MED ORDER — SODIUM CHLORIDE 0.9 % IV SOLN
INTRAVENOUS | Status: DC
  Administered 2012-08-13: 12:00:00 via INTRAVENOUS

## 2012-08-13 MED ORDER — MIDAZOLAM HCL 5 MG/5ML IJ SOLN
INTRAMUSCULAR | Status: DC | PRN
  Administered 2012-08-13 (×3): 2 mg via INTRAVENOUS
  Administered 2012-08-13: 3 mg via INTRAVENOUS

## 2012-08-13 MED ORDER — BUTAMBEN-TETRACAINE-BENZOCAINE 2-2-14 % EX AERO
INHALATION_SPRAY | CUTANEOUS | Status: DC | PRN
  Administered 2012-08-13: 2 via TOPICAL

## 2012-08-13 MED ORDER — MEPERIDINE HCL 50 MG/ML IJ SOLN
INTRAMUSCULAR | Status: AC
  Filled 2012-08-13: qty 1
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
## 2012-08-13 NOTE — Op Note (Signed)
EGD PROCEDURE REPORT  PATIENT:  Tonya Sullivan  MR#:  [id number] Birthdate:  04-Nov-1955, 57 y.o., female Endoscopist:  Dr. Malissa Hippo, MD Referred By:  Dr. Milinda Antis, MD  Procedure Date: 08/13/2012  Procedure:   EGD with ED.  Indications:  Patient is 57 year old Caucasian female with multiple medical problems who presents with dysphagia to solids and pills. She was recently hospitalized for aspiration pneumonia and had barium study which suggested motility disorder and a stricture at GE junction. She has not experienced heartburn since she had anti-reflux surgery about 15 years ago.            Informed Consent:  The risks, benefits, alternatives & imponderables which include, but are not limited to, bleeding, infection, perforation, drug reaction and potential missed lesion have been reviewed.  The potential for biopsy, lesion removal, esophageal dilation, etc. have also been discussed.  Questions have been answered.  All parties agreeable.  Please see history & physical in medical record for more information.  Medications:  Demerol 50 mg IV Versed 9 mg IV Cetacaine spray topically for oropharyngeal anesthesia  Description of procedure:  The endoscope was introduced through the mouth and advanced to the second portion of the duodenum without difficulty or limitations. The mucosal surfaces were surveyed very carefully during advancement of the scope and upon withdrawal.  Findings:  Esophagus:  Mucosa of the esophagus was normal. The GE junction was unremarkable without stricture or ring. GEJ:  40 cm Stomach:  Stomach was empty and distended very well with insufflation. Folds in the proximal stomach were normal. Examination of mucosa at body was normal. There was prepyloric patchy erythema and  small scar to the right the pyloric channel. Pyloric channel was patent. Angularis, fundus and cardia were examined by retroflexing  of the scope. Short fundal wrap was noted. Duodenum:  Normal  bulbar and post bulbar mucosa.  Therapeutic/Diagnostic Maneuvers Performed:   Esophagus was dilated by passing 54 and 56 [place] Maloney dilators to full insertion. Esophageal mucosa was reexamined post dilation and no mucosal disruption noted.  Complications:  None  Impression: No evidence of esophagitis or stricture at GE junction. Short fundal wrap. Nonerosive antral gastritis with a small scar. Esophagus dilated by passing 54 and 56 [place] Maloney dilators but no mucosal disruption noted. 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 If she does not respond to esophageal dilation will consider esophageal manometry.  Recommendations:  Discontinue Dexlansoprazole. H. pylori serology. Patient advised to call with progress report in one week.  REHMAN,NAJEEB U  08/13/2012  1:01 PM  CC: Dr. Milinda Antis, MD & Dr. Bonnetta Barry ref. provider found

## 2012-08-13 NOTE — Telephone Encounter (Signed)
Wants to know if you can write a prescription for her to get new dentures? She says the script needs to say medically necessarily, she says her dentures she has now is causing her to have blisters. The prescription needs to go to [facility].. Phone number is [phone]

## 2012-08-13 NOTE — Telephone Encounter (Signed)
Note written

## 2012-08-15 ENCOUNTER — Encounter (HOSPITAL_COMMUNITY): Payer: Self-pay | Admitting: Internal Medicine

## 2012-08-15 NOTE — Telephone Encounter (Signed)
Faxed over letter to [facility] to [phone]

## 2012-08-20 ENCOUNTER — Ambulatory Visit: Payer: Medicare Other | Admitting: Family Medicine

## 2012-08-20 ENCOUNTER — Ambulatory Visit (INDEPENDENT_AMBULATORY_CARE_PROVIDER_SITE_OTHER): Payer: Medicare Other | Admitting: Psychiatry

## 2012-08-20 DIAGNOSIS — F319 Bipolar disorder, unspecified: Secondary | ICD-10-CM

## 2012-08-20 NOTE — Progress Notes (Signed)
Patient:  Tonya Sullivan   DOB: October 11, 1955  MR Number: [id number]  [facility]:  [address] [place] [place],  [place], [address]  Start: Wednesday 08/20/2012 1:00  PM  Wednesday 08/20/2012 1:20  PM End:   Provider/Observer:     Florencia Reasons, MSW, LCSW   Chief Complaint:      Chief Complaint  Patient presents with  . Anxiety  . Depression    Reason For Service:     The patient was referred for services by psychiatrist Dr. Lolly Mustache due to patient experiencing anxiety, stress, depression, and mood swings. She has a long-standing history of depression beginning in adolescence. Patient also has significant trauma history. Patient also experienced grief and loss issues due to to the death of her husband in Sep 13, 2010. Patient is seen today for follow up appointment  Interventions Strategy:  Supportive therapy,  cognitive behavioral therapy  Participation Level:   Active  Participation Quality:  Appropriate      Behavioral Observation:  Fairly groomed, Alert, talkative  Current Psychosocial Factors: Patient reports planning to move in with her son and his family this weekend.  Content of Session:   Reviewing symptoms, processing feelings, identifying ways to improve assertiveness skills, identifying coping statements  Current Status:   Patient reports increased anxiety and worry along with indecisiveness.  Patient Progress:    The patient reports increased anxiety and second thoughts about moving in with her son and his family. Therapist works with patient to process her feelings. Patient states not wanting to leave her apartment as she no longer has her safe place. She also reports feeling as though she is abandoning her husband as her apartment was the last place they were together. Patient also expresses sadness about no longer having a place where her granddaughter can visit. Patient expresses frustration as her son and his girlfriend do not understand patient's  attachment to some of her possessions and have been urging patient to discard a give away some of her items. Therapist works with patient to identify assertive statements to use regarding items patient wants to keep. Therapist also works with patient to identify coping statements and patient's options regarding a trial placement with her son and his family.    Target Goals:   1. Improving mood as evidenced by resuming normal interest in activities and increased social involvement. 2. Increase self acceptance and improve self-esteem as evidenced by patient making positive statements about self. 3.  Improve coping and relaxation techniques. 4. Process and resolve grief and loss issues.   Last Reviewed:   04/06/2011  Goals Addressed Today:    Goal  3  Impression/Diagnosis:   The patient presents with a long-standing history of recurrent depression, mood swings, and a significant trauma history.  Diagnosis: Major Depressive Disorder  Diagnosis:  Axis I:  Bipolar Disorder           Axis II: Deferred

## 2012-08-20 NOTE — Patient Instructions (Signed)
Discussed orally 

## 2012-08-22 ENCOUNTER — Ambulatory Visit (HOSPITAL_COMMUNITY): Payer: Self-pay | Admitting: Psychiatry

## 2012-08-25 ENCOUNTER — Telehealth (INDEPENDENT_AMBULATORY_CARE_PROVIDER_SITE_OTHER): Payer: Self-pay | Admitting: *Deleted

## 2012-08-25 ENCOUNTER — Telehealth: Payer: Self-pay | Admitting: Family Medicine

## 2012-08-25 MED ORDER — PRAVASTATIN SODIUM 40 MG PO TABS: 40.0000 mg | ORAL_TABLET | Freq: Every evening | ORAL | Status: DC

## 2012-08-25 NOTE — Telephone Encounter (Signed)
Rx Refilled  

## 2012-08-25 NOTE — Telephone Encounter (Signed)
I will discuss this with Dr.Rehman. 

## 2012-08-25 NOTE — Telephone Encounter (Signed)
Was to call the office if she had anymore choking episodes. Please let Dr. Karilyn Cota know she got choked on a P&J Sandwich. Her return phone number is [phone].

## 2012-08-28 NOTE — Telephone Encounter (Signed)
This ws discussed with Dr.Rehman on 08/25/12.

## 2012-08-29 ENCOUNTER — Telehealth: Payer: Self-pay | Admitting: Family Medicine

## 2012-08-29 NOTE — Telephone Encounter (Signed)
Pt wants to know if you want her to get on the CAP waiting list.

## 2012-08-29 NOTE — Telephone Encounter (Signed)
I am not sure what she is referring too, she already has PCS services set up Please clarify

## 2012-08-29 NOTE — Telephone Encounter (Signed)
Pt just wants you to put her on the waiting list for CAP in case she needs it, she states that she is needing more hours and PCS want give it to her and they suggested CAP.

## 2012-08-29 NOTE — Telephone Encounter (Signed)
There is nothing I can do further, if she has to be on a waiting list. She has to keep the hours she has now

## 2012-09-02 NOTE — Telephone Encounter (Signed)
Pt aware.

## 2012-09-03 ENCOUNTER — Ambulatory Visit (HOSPITAL_COMMUNITY): Payer: Self-pay | Admitting: Psychiatry

## 2012-09-05 ENCOUNTER — Ambulatory Visit (INDEPENDENT_AMBULATORY_CARE_PROVIDER_SITE_OTHER): Payer: Medicare Other | Admitting: Family Medicine

## 2012-09-05 VITALS — BP 120/80 | HR 64 | Temp 98.2°F | Resp 16 | Wt 195.0 lb

## 2012-09-05 DIAGNOSIS — B9789 Other viral agents as the cause of diseases classified elsewhere: Secondary | ICD-10-CM

## 2012-09-05 DIAGNOSIS — B349 Viral infection, unspecified: Secondary | ICD-10-CM

## 2012-09-05 NOTE — Progress Notes (Signed)
  Subjective:    Patient ID: Tonya Sullivan, female    DOB: 10-Apr-1955, 56 y.o.   MRN: [id number]  HPI  Pt here with chills, fatigue, cramping in abdomen since 4AM. Cramping has not resolved, no diarrhea associated or change in stools. Denies N/V. No documented fever. Cough minimally with no production. No new medications. No rash    Review of Systems   GEN- + fatigue, fever, weight loss,weakness, recent illness HEENT- denies eye drainage, change in vision, nasal discharge, CVS- denies chest pain, palpitations RESP- denies SOB, cough, wheeze ABD- denies N/V, change in stools, abd pain GU- denies dysuria, hematuria, dribbling, incontinence Neuro- denies headache, dizziness, syncope, seizure activity      Objective:   Physical Exam GEN- NAD, alert and oriented x3 HEENT- PERRL, EOMI, non injected sclera, pink conjunctiva, MMM, oropharynx clear, TM clear bilat no effusion., right obscurred by wax,no maxillary sinus tenderness, nares clear Neck- Supple, no LAD CVS- RRR, no murmur RESP-CTAB ABD-NABS,soft,NT,ND EXT- No edema Pulses- Radial 2+         Assessment & Plan:

## 2012-09-05 NOTE — Patient Instructions (Signed)
Push the fluids  Rest  Use inhaler as needed F/U Call as needed

## 2012-09-06 ENCOUNTER — Encounter: Payer: Self-pay | Admitting: Family Medicine

## 2012-09-06 DIAGNOSIS — B349 Viral infection, unspecified: Secondary | ICD-10-CM | POA: Insufficient documentation

## 2012-09-06 NOTE — Assessment & Plan Note (Signed)
Symptoms are vague and only present a short period of time. Likley viral illness based on presentation today Tincture of time to see if any other symptoms will show up No meds given Rest, fluids

## 2012-09-08 ENCOUNTER — Ambulatory Visit (INDEPENDENT_AMBULATORY_CARE_PROVIDER_SITE_OTHER): Payer: Medicare Other | Admitting: Psychiatry

## 2012-09-08 DIAGNOSIS — F411 Generalized anxiety disorder: Secondary | ICD-10-CM

## 2012-09-08 DIAGNOSIS — F319 Bipolar disorder, unspecified: Secondary | ICD-10-CM

## 2012-09-08 NOTE — Progress Notes (Signed)
Patient:  Tonya Sullivan   DOB: 1955/08/26  MR Number: [id number]  [facility]: [facility]:  [address] [place],  [place], [address]  Start: Monday 09/08/2012 1:00 PM  Monday 09/08/2012 1:55 PM End:   Provider/Observer:     Florencia Reasons, MSW, LCSW   Chief Complaint:      Chief Complaint  Patient presents with  . Anxiety    Reason For Service:     The patient was referred for services by psychiatrist Dr. Lolly Mustache due to patient experiencing anxiety, stress, depression, and mood swings. She has a long-standing history of depression beginning in adolescence. Patient also has significant trauma history. Patient also experienced grief and loss issues due to to the death of her husband in 10/09/2010. Patient is seen today for follow up appointment  Interventions Strategy:  Supportive therapy,  cognitive behavioral therapy  Participation Level:   Active  Participation Quality:  Appropriate      Behavioral Observation:  Fairly groomed, Alert, talkative  Current Psychosocial Factors: Patient reports moving in with her son and his family 2 weeks ago  Content of Session:   Reviewing symptoms, processing feelings, identifying ways to improve assertiveness skills, identifying coping statements  Current Status:   Patient reports less depressed mood but continued anxiety.  Patient Progress:    The patient reports ambivalent feelings about residing with her son and his family. Her son's girlfriend is patient's CNA but has been inconsistent in providing care. Patient also reports sometimes having to serve as a babysitter for her granddaughter in the mornings as her son's girlfriend does not get up early in the mornings. She also has concerns about financial issues as her son and his girlfriend there are old her car and do not always give her gas money. Therapist works with patient to process her feelings and to identify ways to set and maintain boundaries as well as identify assertive  statements.  Target Goals:   1. Improving mood as evidenced by resuming normal interest in activities and increased social involvement. 2. Increase self acceptance and improve self-esteem as evidenced by patient making positive statements about self. 3.  Improve coping and relaxation techniques. 4. Process and resolve grief and loss issues.   Last Reviewed:   04/06/2011  Goals Addressed Today:    Goal  3  Impression/Diagnosis:   The patient presents with a long-standing history of recurrent depression, mood swings, and a significant trauma history.  Diagnosis: Major Depressive Disorder  Diagnosis:  Axis I:  Bipolar Disorder           Axis II: Deferred

## 2012-09-08 NOTE — Patient Instructions (Signed)
Discussed orally 

## 2012-09-09 ENCOUNTER — Ambulatory Visit (HOSPITAL_COMMUNITY): Payer: Self-pay | Admitting: Psychiatry

## 2012-09-11 ENCOUNTER — Other Ambulatory Visit (HOSPITAL_COMMUNITY): Payer: Self-pay | Admitting: Neurological Surgery

## 2012-09-11 DIAGNOSIS — M542 Cervicalgia: Secondary | ICD-10-CM

## 2012-09-15 ENCOUNTER — Ambulatory Visit (HOSPITAL_COMMUNITY)
Admission: RE | Admit: 2012-09-15 | Discharge: 2012-09-15 | Disposition: A | Discharge status: Home / Self Care | Payer: Medicare Other | Source: Ambulatory Visit | Attending: Neurological Surgery | Admitting: Neurological Surgery

## 2012-09-15 ENCOUNTER — Encounter (HOSPITAL_COMMUNITY): Payer: Self-pay | Admitting: Psychiatry

## 2012-09-15 ENCOUNTER — Ambulatory Visit (INDEPENDENT_AMBULATORY_CARE_PROVIDER_SITE_OTHER): Payer: Medicare Other | Admitting: Psychiatry

## 2012-09-15 VITALS — Ht 67.0 in | Wt 196.0 lb

## 2012-09-15 DIAGNOSIS — F319 Bipolar disorder, unspecified: Secondary | ICD-10-CM

## 2012-09-15 DIAGNOSIS — F332 Major depressive disorder, recurrent severe without psychotic features: Secondary | ICD-10-CM

## 2012-09-15 DIAGNOSIS — R52 Pain, unspecified: Secondary | ICD-10-CM

## 2012-09-15 DIAGNOSIS — M542 Cervicalgia: Secondary | ICD-10-CM | POA: Insufficient documentation

## 2012-09-15 DIAGNOSIS — F329 Major depressive disorder, single episode, unspecified: Secondary | ICD-10-CM

## 2012-09-15 DIAGNOSIS — M4802 Spinal stenosis, cervical region: Secondary | ICD-10-CM | POA: Insufficient documentation

## 2012-09-15 DIAGNOSIS — R209 Unspecified disturbances of skin sensation: Secondary | ICD-10-CM | POA: Insufficient documentation

## 2012-09-15 DIAGNOSIS — M5380 Other specified dorsopathies, site unspecified: Secondary | ICD-10-CM | POA: Insufficient documentation

## 2012-09-15 DIAGNOSIS — F411 Generalized anxiety disorder: Secondary | ICD-10-CM

## 2012-09-15 MED ORDER — AMITRIPTYLINE HCL 50 MG PO TABS: 50.0000 mg | ORAL_TABLET | Freq: Every day | ORAL | Status: DC
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
## 2012-09-22 ENCOUNTER — Ambulatory Visit (HOSPITAL_COMMUNITY): Payer: Self-pay | Admitting: Psychiatry

## 2012-09-25 ENCOUNTER — Other Ambulatory Visit: Payer: Self-pay | Admitting: Neurological Surgery

## 2012-09-25 ENCOUNTER — Ambulatory Visit (HOSPITAL_COMMUNITY): Payer: Medicare Other | Admitting: Psychiatry

## 2012-09-30 ENCOUNTER — Ambulatory Visit (HOSPITAL_COMMUNITY): Payer: Self-pay | Admitting: Psychiatry

## 2012-10-01 ENCOUNTER — Encounter (HOSPITAL_COMMUNITY)
Admission: RE | Admit: 2012-10-01 | Discharge: 2012-10-01 | Disposition: A | Discharge status: Home / Self Care | Payer: Medicare Other | Source: Ambulatory Visit | Attending: Neurology | Admitting: Neurology

## 2012-10-01 ENCOUNTER — Encounter (HOSPITAL_COMMUNITY)
Admission: RE | Admit: 2012-10-01 | Discharge: 2012-10-01 | Disposition: A | Discharge status: Home / Self Care | Payer: Medicare Other | Source: Ambulatory Visit | Attending: "Endocrinology | Admitting: "Endocrinology

## 2012-10-01 DIAGNOSIS — E2749 Other adrenocortical insufficiency: Secondary | ICD-10-CM | POA: Insufficient documentation

## 2012-10-01 DIAGNOSIS — M47812 Spondylosis without myelopathy or radiculopathy, cervical region: Secondary | ICD-10-CM | POA: Insufficient documentation

## 2012-10-01 LAB — COMPREHENSIVE METABOLIC PANEL
BUN: 15 mg/dL (ref 6–23)
CO2: 30 mEq/L (ref 19–32)
Chloride: 97 mEq/L (ref 96–112)
Creatinine, Ser: 1.07 mg/dL (ref 0.50–1.10)
GFR calc Af Amer: 65 mL/min — ABNORMAL LOW (ref >90)
GFR calc non Af Amer: 57 mL/min — ABNORMAL LOW (ref >90)
Glucose, Bld: 98 mg/dL (ref 70–99)
Total Bilirubin: 0.1 mg/dL — ABNORMAL LOW (ref 0.3–1.2)

## 2012-10-01 MED ORDER — COSYNTROPIN 0.25 MG IJ SOLR
0.2500 mg | Freq: Once | INTRAMUSCULAR | Status: AC
  Administered 2012-10-01: 0.25 mg via INTRAVENOUS
  Filled 2012-10-01: qty 0.25

## 2012-10-01 MED ORDER — SODIUM CHLORIDE 0.9 % IJ SOLN
10.0000 mL | INTRAMUSCULAR | Status: DC | PRN
  Administered 2012-10-01 (×3): 10 mL via INTRAVENOUS

## 2012-10-01 MED ORDER — DIPHENHYDRAMINE HCL 50 MG/ML IJ SOLN
25.0000 mg | Freq: Once | INTRAMUSCULAR | Status: AC
  Administered 2012-10-01: 25 mg via INTRAVENOUS
  Filled 2012-10-01: qty 1

## 2012-10-02 LAB — HEMOGLOBIN A1C
Hgb A1c MFr Bld: 5.7 % — ABNORMAL HIGH (ref <5.7)
Mean Plasma Glucose: 117 mg/dL — ABNORMAL HIGH (ref <117)

## 2012-10-02 NOTE — Progress Notes (Signed)
Results for Tonya Sullivan, Tonya Sullivan (MRN [id number])   Ref. Range 10/01/2012 12:20  Sodium Latest Range: 135-145 mEq/L 134 (L)  Potassium Latest Range: 3.5-5.1 mEq/L 4.8  Chloride Latest Range: 96-112 mEq/L 97  CO2 Latest Range: 19-32 mEq/L 30  Mean Plasma Glucose Latest Range: <117 mg/dL 409 (H)  BUN Latest Range: 6-23 mg/dL 15  Creatinine Latest Range: 0.50-1.10 mg/dL 8.11  Calcium Latest Range: 8.4-10.5 mg/dL 9.9  GFR calc non Af Amer Latest Range: >90 mL/min 57 (L)  GFR calc Af Amer Latest Range: >90 mL/min 65 (L)  Glucose Latest Range: 70-99 mg/dL 98  Alkaline Phosphatase Latest Range: 39-117 U/L 49  Albumin Latest Range: 3.5-5.2 g/dL 3.7  AST Latest Range: 0-37 U/L 13  ALT Latest Range: 0-35 U/L 11  Total Protein Latest Range: 6.0-8.3 g/dL 6.6  Total Bilirubin Latest Range: 0.3-1.2 mg/dL 0.1 (L)  Hemoglobin B1Y Latest Range: <5.7 % 5.7 (H)

## 2012-10-04 ENCOUNTER — Emergency Department (HOSPITAL_COMMUNITY)
Admission: EM | Admit: 2012-10-04 | Discharge: 2012-10-04 | Disposition: A | Discharge status: Home / Self Care | Payer: Medicare Other | Attending: Emergency Medicine | Admitting: Emergency Medicine

## 2012-10-04 ENCOUNTER — Encounter (HOSPITAL_COMMUNITY): Payer: Self-pay | Admitting: Emergency Medicine

## 2012-10-04 DIAGNOSIS — Z9889 Other specified postprocedural states: Secondary | ICD-10-CM | POA: Insufficient documentation

## 2012-10-04 DIAGNOSIS — M545 Low back pain, unspecified: Secondary | ICD-10-CM | POA: Insufficient documentation

## 2012-10-04 DIAGNOSIS — E785 Hyperlipidemia, unspecified: Secondary | ICD-10-CM | POA: Insufficient documentation

## 2012-10-04 DIAGNOSIS — J449 Chronic obstructive pulmonary disease, unspecified: Secondary | ICD-10-CM | POA: Insufficient documentation

## 2012-10-04 DIAGNOSIS — Z8679 Personal history of other diseases of the circulatory system: Secondary | ICD-10-CM | POA: Insufficient documentation

## 2012-10-04 DIAGNOSIS — J4489 Other specified chronic obstructive pulmonary disease: Secondary | ICD-10-CM | POA: Insufficient documentation

## 2012-10-04 DIAGNOSIS — G8929 Other chronic pain: Secondary | ICD-10-CM | POA: Insufficient documentation

## 2012-10-04 DIAGNOSIS — Z79899 Other long term (current) drug therapy: Secondary | ICD-10-CM | POA: Insufficient documentation

## 2012-10-04 DIAGNOSIS — F17200 Nicotine dependence, unspecified, uncomplicated: Secondary | ICD-10-CM | POA: Insufficient documentation

## 2012-10-04 DIAGNOSIS — Z85820 Personal history of malignant melanoma of skin: Secondary | ICD-10-CM | POA: Insufficient documentation

## 2012-10-04 DIAGNOSIS — F319 Bipolar disorder, unspecified: Secondary | ICD-10-CM | POA: Insufficient documentation

## 2012-10-04 DIAGNOSIS — Z8739 Personal history of other diseases of the musculoskeletal system and connective tissue: Secondary | ICD-10-CM | POA: Insufficient documentation

## 2012-10-04 MED ORDER — MORPHINE SULFATE 2 MG/ML IJ SOLN: 8.0000 mg | Freq: Once | INTRAMUSCULAR | Status: AC

## 2012-10-04 MED ORDER — MORPHINE SULFATE 10 MG/ML IJ SOLN
INTRAMUSCULAR | Status: AC
  Administered 2012-10-04: 8 mg via INTRAMUSCULAR
  Filled 2012-10-04: qty 1

## 2012-10-04 MED ORDER — ONDANSETRON 8 MG PO TBDP
8.0000 mg | ORAL_TABLET | Freq: Once | ORAL | Status: AC
  Administered 2012-10-04: 8 mg via ORAL
  Filled 2012-10-04: qty 1

## 2012-10-04 NOTE — ED Notes (Signed)
Patient with c/o chronic back pain, worse over last week. No injury. Reports inadequate pain control at home. Had epidural block last week and a trigger point injection yesterday by Dr Gerilyn Pilgrim. Denies bowel/bladder symptoms. States she is unable to complete daily tasks.

## 2012-10-04 NOTE — ED Notes (Signed)
PA at bedside.

## 2012-10-05 NOTE — ED Provider Notes (Signed)
CSN: [id number]     Arrival date & time 10/04/12  0935 History   First MD Initiated Contact with Patient 10/04/12 [phone]     Chief Complaint  Patient presents with  . Back Pain   (Consider location/radiation/quality/duration/timing/severity/associated sxs/prior Treatment) HPI Comments: Patient has hx of chronic low back pain worsening for one week.  Sees Dr. Gerilyn Pilgrim for pain management.  Had epidural one week ago and "tigger point injection" yesterday without relief.  Comes to ED requesting MRI of her back.  States that she is scheduled to have surgery on her neck later this month by Dr. Danielle Dess.  She denies fever, chills, abd pain, chest pain, vomiting, incontinence of bowel or bladder or LE numbness or weakness.   Patient is a 57 y.o. female presenting with back pain. The history is provided by the patient.  Back Pain Location:  Lumbar spine Quality:  Aching Radiates to:  Does not radiate Pain severity:  Severe Pain is:  Same all the time Onset quality:  Gradual Duration:  1 week Timing:  Constant Progression:  Worsening Chronicity:  Chronic Context: not falling, not lifting heavy objects, not recent illness and not recent injury   Relieved by:  Nothing Worsened by:  Bending, sitting, standing and twisting Ineffective treatments:  Narcotics Associated symptoms: no abdominal pain, no abdominal swelling, no bladder incontinence, no bowel incontinence, no chest pain, no dysuria, no fever, no headaches, no leg pain, no numbness, no paresthesias, no pelvic pain, no perianal numbness, no tingling and no weakness   Risk factors: no recent surgery and no vascular disease     Past Medical History  Diagnosis Date  . COPD (chronic obstructive pulmonary disease)   . Depression   . Chronic back pain     DDD, disc bulge, radiculopathy, spinal stenosis  . Hyperlipidemia   . Anxiety   . Bipolar disorder, unspecified   . Carotid artery calcification   . DDD (degenerative disc disease),  lumbosacral   . Spinal stenosis, lumbar   . Melanoma     removed 1 week ago at Dr Laurena Slimmer office  . Thyroid disease   . Autoimmune thyroiditis    Past Surgical History  Procedure Laterality Date  . Appendectomy    . Tonsillectomy    . Cholecystectomy    . Lump left breast      removed- benign  . Abdominal hysterectomy      tubal pregnancy  . Inguinal hernia repair    . Stomach surgery      ?holes in esophgous  . Ectopic pregnancy surgery    . Back surgery      x 5;1984;1989;;1999;2000;2010  . Colonoscopy  01/25/2012    Procedure: COLONOSCOPY;  Surgeon: Malissa Hippo, MD;  Location: [facility];  Service: Endoscopy;  Laterality: N/A;  130  . Esophagogastroduodenoscopy (egd) with esophageal dilation N/A 08/13/2012    Procedure: ESOPHAGOGASTRODUODENOSCOPY (EGD) WITH ESOPHAGEAL DILATION;  Surgeon: Malissa Hippo, MD;  Location: [facility];  Service: Endoscopy;  Laterality: N/A;  315   Family History  Problem Relation Age of Onset  . Hypertension Mother   . Heart disease Mother   . Heart attack Mother   . Anxiety disorder Mother   . Cancer Father     lung cancer  . Heart attack Father   . Alcohol abuse Father   . Hypertension Brother   . Alcohol abuse Brother   . Bipolar disorder Brother   . ADD / ADHD Brother   .  Drug abuse Brother   . OCD Brother   . Alcohol abuse Sister   . Bipolar disorder Sister   . Anxiety disorder Sister   . ADD / ADHD Sister   . Drug abuse Sister   . Physical abuse Sister   . Sexual abuse Sister   . Alcohol abuse Brother   . Bipolar disorder Brother   . ADD / ADHD Brother   . Drug abuse Brother   . Alcohol abuse Sister   . Bipolar disorder Sister   . Anxiety disorder Sister   . ADD / ADHD Sister   . Physical abuse Sister   . Sexual abuse Sister   . Alcohol abuse Brother   . Bipolar disorder Brother   . ADD / ADHD Brother   . Bipolar disorder Brother   . ADD / ADHD Brother   . Alcohol abuse Brother   . Paranoid behavior Brother    . Physical abuse Brother   . Sexual abuse Brother   . Bipolar disorder Maternal Aunt   . Bipolar disorder Paternal Aunt   . Bipolar disorder Maternal Uncle   . Bipolar disorder Paternal Uncle   . Bipolar disorder Maternal Grandfather   . Alcohol abuse Maternal Grandfather   . Bipolar disorder Maternal Grandmother   . Alcohol abuse Maternal Grandmother   . Anxiety disorder Maternal Grandmother   . Dementia Maternal Grandmother   . Bipolar disorder Paternal Grandfather   . Alcohol abuse Paternal Grandfather   . Bipolar disorder Paternal Grandmother   . Alcohol abuse Paternal Grandmother   . Drug abuse Paternal Grandmother   . Bipolar disorder Maternal Uncle   . Schizophrenia Neg Hx   . Seizures Neg Hx    History  Substance Use Topics  . Smoking status: Current Every Day Smoker -- 0.50 packs/day for 40 years    Types: Cigarettes  . Smokeless tobacco: Never Used     Comment: 6 or 7 cigs a day as of 05/21/2012  . Alcohol Use: No   OB History   Grav Para Term Preterm Abortions TAB SAB Ect Mult Living   2 1 1  1   1  1      Review of Systems  Constitutional: Negative for fever.  HENT: Negative for neck pain.   Respiratory: Negative for shortness of breath.   Cardiovascular: Negative for chest pain.  Gastrointestinal: Negative for nausea, vomiting, abdominal pain, constipation and bowel incontinence.  Genitourinary: Negative for bladder incontinence, dysuria, hematuria, flank pain, decreased urine volume, difficulty urinating and pelvic pain.       No perineal numbness or incontinence of urine or feces  Musculoskeletal: Positive for back pain. Negative for joint swelling and gait problem.  Skin: Negative for rash.  Neurological: Negative for dizziness, tingling, syncope, speech difficulty, weakness, numbness, headaches and paresthesias.  Psychiatric/Behavioral: The patient is not nervous/anxious.   All other systems reviewed and are negative.    Allergies  Codeine;  Cyclobenzaprine; Darvocet; Abilify; Ace inhibitors; Adhesive; Gabapentin; Iodine; Levofloxacin; Prednisone; and Sulfa antibiotics  Home Medications   Current Outpatient Rx  Name  Route  Sig  Dispense  Refill  . albuterol (PROVENTIL HFA;VENTOLIN HFA) 108 (90 BASE) MCG/ACT inhaler   Inhalation   Inhale 2 puffs into the lungs 3 (three) times daily.         Marland Kitchen albuterol (PROVENTIL) (2.5 MG/3ML) 0.083% nebulizer solution   Nebulization   Take 3 mLs (2.5 mg total) by nebulization every 4 (four) hours as needed for wheezing.  75 mL   3   . amitriptyline (ELAVIL) 50 MG tablet   Oral   Take 1 tablet (50 mg total) by mouth at bedtime.   30 tablet   2   . baclofen (LIORESAL) 10 MG tablet   Oral   Take 10 mg by mouth 2 (two) times daily.         . clonazePAM (KLONOPIN) 1 MG tablet   Oral   Take 1 tablet (1 mg total) by mouth 2 (two) times daily.   60 tablet   1   . hydrochlorothiazide (HYDRODIURIL) 25 MG tablet   Oral   Take 1 tablet (25 mg total) by mouth daily. For blood pressure and swelling   30 tablet   3   . HYDROmorphone (DILAUDID) 4 MG tablet   Oral   Take 4 mg by mouth every 8 (eight) hours as needed for pain.          Marland Kitchen lidocaine (LIDODERM) 5 %   Transdermal   Place 1 patch onto the skin every 12 (twelve) hours. Remove & Discard patch within 12 hours or as directed by MD   30 patch   2   . losartan (COZAAR) 50 MG tablet   Oral   Take 1 tablet (50 mg total) by mouth daily.   90 tablet   3   . naproxen (NAPROSYN) 500 MG tablet   Oral   Take 500 mg by mouth 2 (two) times daily.         . pravastatin (PRAVACHOL) 40 MG tablet   Oral   Take 1 tablet (40 mg total) by mouth every evening.   30 tablet   3   . propranolol (INDERAL) 10 MG tablet   Oral   Take 10 mg by mouth 3 (three) times daily.         Marland Kitchen tiotropium (SPIRIVA HANDIHALER) 18 MCG inhalation capsule   Inhalation   Place 1 capsule (18 mcg total) into inhaler and inhale daily.   30  capsule   6   . ziprasidone (GEODON) 80 MG capsule   Oral   Take 1 capsule (80 mg total) by mouth 2 (two) times daily with a meal.   120 capsule   0    BP 112/74  Pulse 72  Temp(Src) 98.4 F (36.9 C) (Oral)  Resp 17  Ht 5\' 7"  (1.702 m)  Wt 194 lb (87.998 kg)  BMI 30.38 kg/m2  SpO2 99% Physical Exam  Nursing note and vitals reviewed. Constitutional: She is oriented to person, place, and time. She appears well-developed and well-nourished. No distress.  HENT:  Head: Normocephalic and atraumatic.  Neck: Normal range of motion. Neck supple.  Cardiovascular: Normal rate, regular rhythm, normal heart sounds and intact distal pulses.   No murmur heard. Pulmonary/Chest: Effort normal and breath sounds normal. No respiratory distress.  Abdominal: Soft. She exhibits no distension. There is no tenderness.  Musculoskeletal: She exhibits tenderness. She exhibits no edema.       Lumbar back: She exhibits tenderness and pain. She exhibits normal range of motion, no swelling, no deformity, no laceration and normal pulse.  ttp of the lumbar spine and paraspinal muscles.    DP pulses are brisk and symmetrical.  Distal sensation intact.  Hip Flexors/Extensors are intact  Neurological: She is alert and oriented to person, place, and time. She has normal strength. No sensory deficit. She exhibits normal muscle tone. Coordination and gait normal.  Reflex Scores:  Patellar reflexes are 2+ on the right side and 2+ on the left side.      Achilles reflexes are 2+ on the right side and 2+ on the left side. Skin: Skin is warm and dry. No rash noted.    ED Course  Procedures (including critical care time) Labs Review Labs Reviewed - No data to display Imaging Review No results found.  MDM  Previous charts reviewed.     1. Back pain, chronic    Pt with hx of chronic low back pain, no concerning sx's for emergent neurological or infectious process to indicate need for MRI at this time,  explained to pt and she verbalized understanding and agrees to f/u with DR. [place] or Dr. Danielle Dess on Monday.  Pt states she has pain medication at home, does not request additional prescription medications   Krissie Merrick L. Riley Papin, PA-C 10/05/12 1750

## 2012-10-06 ENCOUNTER — Other Ambulatory Visit: Payer: Self-pay | Admitting: Neurology

## 2012-10-06 ENCOUNTER — Ambulatory Visit: Payer: Medicare Other | Admitting: Family Medicine

## 2012-10-06 DIAGNOSIS — M5410 Radiculopathy, site unspecified: Secondary | ICD-10-CM

## 2012-10-07 ENCOUNTER — Ambulatory Visit (INDEPENDENT_AMBULATORY_CARE_PROVIDER_SITE_OTHER): Payer: Medicare Other | Admitting: Psychiatry

## 2012-10-07 ENCOUNTER — Ambulatory Visit (HOSPITAL_COMMUNITY)
Admission: RE | Admit: 2012-10-07 | Discharge: 2012-10-07 | Disposition: A | Discharge status: Home / Self Care | Payer: Medicare Other | Source: Ambulatory Visit | Attending: Neurology | Admitting: Neurology

## 2012-10-07 ENCOUNTER — Encounter (HOSPITAL_COMMUNITY): Payer: Self-pay

## 2012-10-07 ENCOUNTER — Encounter: Payer: Self-pay | Admitting: Family Medicine

## 2012-10-07 ENCOUNTER — Ambulatory Visit (INDEPENDENT_AMBULATORY_CARE_PROVIDER_SITE_OTHER): Payer: Medicare Other | Admitting: Family Medicine

## 2012-10-07 VITALS — BP 118/60 | HR 68 | Temp 97.4°F | Resp 18 | Wt 203.0 lb

## 2012-10-07 DIAGNOSIS — F319 Bipolar disorder, unspecified: Secondary | ICD-10-CM

## 2012-10-07 DIAGNOSIS — M545 Low back pain, unspecified: Secondary | ICD-10-CM | POA: Insufficient documentation

## 2012-10-07 DIAGNOSIS — R233 Spontaneous ecchymoses: Secondary | ICD-10-CM

## 2012-10-07 DIAGNOSIS — N179 Acute kidney failure, unspecified: Secondary | ICD-10-CM

## 2012-10-07 DIAGNOSIS — M5410 Radiculopathy, site unspecified: Secondary | ICD-10-CM

## 2012-10-07 DIAGNOSIS — M5126 Other intervertebral disc displacement, lumbar region: Secondary | ICD-10-CM | POA: Insufficient documentation

## 2012-10-07 DIAGNOSIS — Z981 Arthrodesis status: Secondary | ICD-10-CM | POA: Insufficient documentation

## 2012-10-07 DIAGNOSIS — J449 Chronic obstructive pulmonary disease, unspecified: Secondary | ICD-10-CM

## 2012-10-07 DIAGNOSIS — I10 Essential (primary) hypertension: Secondary | ICD-10-CM

## 2012-10-07 LAB — CBC WITH DIFFERENTIAL/PLATELET
Basophils Absolute: 0 10*3/uL (ref 0.0–0.1)
Eosinophils Absolute: 0.1 10*3/uL (ref 0.0–0.7)
Eosinophils Relative: 1 % (ref 0–5)
HCT: 40.2 % (ref 36.0–46.0)
Hemoglobin: 13.8 g/dL (ref 12.0–15.0)
Lymphocytes Relative: 31 % (ref 12–46)
Lymphs Abs: 2.6 10*3/uL (ref 0.7–4.0)
MCH: 30.8 pg (ref 26.0–34.0)
MCV: 89.7 fL (ref 78.0–100.0)
Monocytes Absolute: 0.6 10*3/uL (ref 0.1–1.0)
Neutro Abs: 5.2 10*3/uL (ref 1.7–7.7)
RBC: 4.48 MIL/uL (ref 3.87–5.11)
RDW: 14.2 % (ref 11.5–15.5)
WBC: 8.4 10*3/uL (ref 4.0–10.5)

## 2012-10-07 NOTE — Patient Instructions (Addendum)
Continue current medications I recommend Flu shot We will call with lab results F/U 4 months

## 2012-10-07 NOTE — Assessment & Plan Note (Signed)
Currently stable, declines flu shot, okay to proceed with surgery

## 2012-10-07 NOTE — Assessment & Plan Note (Signed)
Recheck CBC, PT/INR, PTT before surgery, I think this is due to small capillaries spontaneous rupture No liver failure

## 2012-10-07 NOTE — Patient Instructions (Signed)
Discussed orally 

## 2012-10-07 NOTE — Progress Notes (Signed)
Patient:  Tonya Sullivan   DOB: [birth year]  MR Number: [id number]  Location: [facility]:  [address] [place],  [place], [address]  Start: Tuesday 10/07/2012 2:00 PM  Tuesday 10/07/2012 2:35 PM End:   Provider/Observer:     Florencia Reasons, MSW, LCSW   Chief Complaint:      Chief Complaint  Patient presents with  . Anxiety    Reason For Service:     The patient was referred for services by psychiatrist Dr. Lolly Mustache due to patient experiencing anxiety, stress, depression, and mood swings. She has a long-standing history of depression beginning in adolescence. Patient also has significant trauma history. Patient also experienced grief and loss issues due to to the death of her husband in 09/16/2010. Patient is seen today for follow up appointment  Interventions Strategy:  Supportive therapy,  cognitive behavioral therapy  Participation Level:   Active  Participation Quality:  Appropriate      Behavioral Observation:  Fairly groomed, Alert, talkative  Current Psychosocial Factors: Patient   Content of Session:   Reviewing symptoms, processing feelings, reinforcing patient's efforts to set and maintain boundaries, identifying coping statements  Current Status:   Patient reports improved mood but continued anxiety.  Patient Progress:    The patient reports increased positive  feelings about residing with her son and his family. Her son's girlfriend has been more responsible and supportive as patient's CNA. Patient also reports no longer feeling she needs to be responsible for her granddaughter in the mornings as her mother has been more involved. Patient is experiencing anxiety related to increased health concerns. She is scheduled to have surgery on her neck on October 21. However, she now fears that she may need back surgery as she experienced increased pain and was unable to walk last week. She had an MRI yesterday and will receive results tomorrow. Patient fears she will  be confined to a wheelchair as she was in 1999 when she had back surgery. She reports being in a wheelchair for 3 years and having little to no support from her husband and her mother. Therapist works with patient to process her feelings and to identify differences in patient's life now. Patient has resumed contact with her sister and her mother. She expresses concern about them but is able to successfully set and maintain boundaries during their conversations.   Target Goals:   1. Improving mood as evidenced by resuming normal interest in activities and increased social involvement. 2. Increase self acceptance and improve self-esteem as evidenced by patient making positive statements about self. 3.  Improve coping and relaxation techniques. 4. Process and resolve grief and loss issues.   Last Reviewed:   04/06/2011  Goals Addressed Today:    Goal  3  Impression/Diagnosis:   The patient presents with a long-standing history of recurrent depression, mood swings, and a significant trauma history.  Diagnosis: Major Depressive Disorder  Diagnosis:  Axis I:  Bipolar Disorder           Axis II: Deferred

## 2012-10-07 NOTE — Progress Notes (Signed)
  Subjective:    Patient ID: [name], female    DOB: [birth year], 57 y.o.   MRN: [id number]  HPI Patient here for followup. She was seen in the emergency room secondary to severe worsening back pain and difficulty ambulating. She had MRI ordered by her neurologist today which does not show any significant change from previous there was no spinal stenosis. She is due to have surgery on her neck on October 21. She's tolerating her other medications without any difficulties. Pain control continues to be a problem. She is still following with endocrinology regarding the flushing episodes and she is also noticed thinning of her hair and continued spontaneous bruising on her arms. She did have ACTH stim test which she is awaiting the results. No other medication changes   Review of Systems  GEN- denies fatigue, fever, weight loss,weakness, recent illness HEENT- denies eye drainage, change in vision, nasal discharge, CVS- denies chest pain, palpitations RESP- denies SOB, cough, wheeze MSK-+ joint pain, muscle aches, injury Neuro- denies headache, dizziness, syncope, seizure activity      Objective:   Physical Exam  GEN- NAD, alert and oriented x3 HEENT- PERRL, EOMI, non injected sclera, pink conjunctiva, MMM, oropharynx clear,  CVS- RRR, no murmur RESP-CTAB ABD-NABS,soft,NT,ND EXT- No edema Pulses- Radial 2+ Skin- ecchymosis bilateral arms, no blanching, few scabs     Assessment & Plan:

## 2012-10-07 NOTE — Assessment & Plan Note (Signed)
Now resolved, cmet done last week in ER

## 2012-10-07 NOTE — Assessment & Plan Note (Signed)
Well controlled, swelling resolved

## 2012-10-08 LAB — APTT: aPTT: 31 seconds (ref 24–37)

## 2012-10-08 LAB — PROTIME-INR: INR: 0.92 (ref <1.50)

## 2012-10-08 NOTE — ED Provider Notes (Signed)
Medical screening examination/treatment/procedure(s) were performed by non-physician practitioner and as supervising physician I was immediately available for consultation/collaboration.   Ihor Meinzer J Connelly Spruell, MD 10/08/12 0714 

## 2012-10-09 ENCOUNTER — Encounter (HOSPITAL_COMMUNITY)
Admission: RE | Admit: 2012-10-09 | Discharge: 2012-10-09 | Disposition: A | Discharge status: Home / Self Care | Payer: Medicare Other | Source: Ambulatory Visit | Attending: "Endocrinology | Admitting: "Endocrinology

## 2012-10-09 MED ORDER — SODIUM CHLORIDE 0.9 % IJ SOLN: 10.0000 mL | INTRAMUSCULAR | Status: DC | PRN

## 2012-10-09 MED ORDER — COSYNTROPIN 0.25 MG IJ SOLR
0.2500 mg | Freq: Once | INTRAMUSCULAR | Status: AC
  Administered 2012-10-09: 0.25 mg via INTRAVENOUS
  Filled 2012-10-09: qty 0.25

## 2012-10-09 MED ORDER — DIPHENHYDRAMINE HCL 50 MG/ML IJ SOLN
25.0000 mg | Freq: Once | INTRAMUSCULAR | Status: AC
  Administered 2012-10-09: 25 mg via INTRAVENOUS
  Filled 2012-10-09: qty 1

## 2012-10-09 NOTE — Progress Notes (Signed)
Baseline cortisol level drawn @ 1100. Cosyntropin ( cortrosyn) 0.25mg  IV given @ 1131. Cortisol levels drawn @ 1203 (30 minutes p injection) and 1233 (60 minutes after injection). Pt tolerated testing well and vital signs remained at preinjection baseline. Blood taken to lab and handed off to [place] [facility] with order requisition.

## 2012-10-10 ENCOUNTER — Encounter (HOSPITAL_COMMUNITY): Payer: Self-pay | Admitting: Pharmacy Technician

## 2012-10-11 LAB — ACTH STIMULATION, 3 TIME POINTS
Cortisol, 30 Min: 9.5 ug/dL — ABNORMAL LOW (ref >20)
Cortisol, 60 Min: 13 ug/dL — ABNORMAL LOW (ref >20)
Cortisol, Base: 1 ug/dL

## 2012-10-13 ENCOUNTER — Ambulatory Visit (HOSPITAL_COMMUNITY)
Admission: RE | Admit: 2012-10-13 | Discharge: 2012-10-13 | Disposition: A | Discharge status: Home / Self Care | Payer: Medicare Other | Source: Ambulatory Visit | Attending: Neurological Surgery | Admitting: Neurological Surgery

## 2012-10-13 ENCOUNTER — Other Ambulatory Visit (HOSPITAL_COMMUNITY): Payer: Self-pay | Admitting: *Deleted

## 2012-10-13 ENCOUNTER — Encounter (HOSPITAL_COMMUNITY): Payer: Self-pay

## 2012-10-13 ENCOUNTER — Telehealth (HOSPITAL_COMMUNITY): Payer: Self-pay | Admitting: *Deleted

## 2012-10-13 ENCOUNTER — Encounter (HOSPITAL_COMMUNITY)
Admission: RE | Admit: 2012-10-13 | Discharge: 2012-10-13 | Disposition: A | Discharge status: Home / Self Care | Payer: Medicare Other | Source: Ambulatory Visit | Attending: Neurological Surgery | Admitting: Neurological Surgery

## 2012-10-13 DIAGNOSIS — J438 Other emphysema: Secondary | ICD-10-CM | POA: Insufficient documentation

## 2012-10-13 DIAGNOSIS — Z01812 Encounter for preprocedural laboratory examination: Secondary | ICD-10-CM | POA: Insufficient documentation

## 2012-10-13 DIAGNOSIS — F319 Bipolar disorder, unspecified: Secondary | ICD-10-CM

## 2012-10-13 DIAGNOSIS — Z01818 Encounter for other preprocedural examination: Secondary | ICD-10-CM | POA: Insufficient documentation

## 2012-10-13 DIAGNOSIS — M47812 Spondylosis without myelopathy or radiculopathy, cervical region: Secondary | ICD-10-CM | POA: Insufficient documentation

## 2012-10-13 HISTORY — DX: Transient cerebral ischemic attack, unspecified: G45.9

## 2012-10-13 HISTORY — DX: Neuralgia and neuritis, unspecified: M79.2

## 2012-10-13 HISTORY — DX: Gastro-esophageal reflux disease without esophagitis: K21.9

## 2012-10-13 LAB — BASIC METABOLIC PANEL
CO2: 33 mEq/L — ABNORMAL HIGH (ref 19–32)
Calcium: 9.3 mg/dL (ref 8.4–10.5)
Chloride: 89 mEq/L — ABNORMAL LOW (ref 96–112)
GFR calc Af Amer: 77 mL/min — ABNORMAL LOW (ref >90)
GFR calc non Af Amer: 66 mL/min — ABNORMAL LOW (ref >90)
Potassium: 3.6 mEq/L (ref 3.5–5.1)
Sodium: 130 mEq/L — ABNORMAL LOW (ref 135–145)

## 2012-10-13 LAB — SURGICAL PCR SCREEN
MRSA, PCR: NEGATIVE
Staphylococcus aureus: NEGATIVE

## 2012-10-13 LAB — CBC
MCHC: 33.9 g/dL (ref 30.0–36.0)
Platelets: 278 10*3/uL (ref 150–400)
RBC: 4.07 MIL/uL (ref 3.87–5.11)
WBC: 7 10*3/uL (ref 4.0–10.5)

## 2012-10-13 MED ORDER — CLONAZEPAM 1 MG PO TABS: 1.0000 mg | ORAL_TABLET | Freq: Two times a day (BID) | ORAL | Status: DC

## 2012-10-13 NOTE — Telephone Encounter (Signed)
Clonazepam refilled per request. Called pharmacy and spoke to "[place]".

## 2012-10-13 NOTE — Progress Notes (Signed)
Results for Tonya Sullivan, Tonya Sullivan (MRN [id number]) as of 10/13/2012 07:34 Results faxed to Dr. Fransico Him 10/13/2012 @ 0737  Ref. Range 10/09/2012 11:00  Cortisol, Base No range found 1.0  Cortisol, 30 Min Latest Range: >20 ug/dL 9.5 (L)  Cortisol, 60 Min Latest Range: >20 ug/dL 78.2 (L)

## 2012-10-13 NOTE — Pre-Procedure Instructions (Addendum)
Tonya Sullivan  10/13/2012   Your procedure is scheduled on:  Tuesday, October 21, 2012   Report to [facility] Entrance "A" at 11:25 AM.   Call this number if you have problems the morning of surgery: [phone]   Remember:   Do not eat food or drink liquids after midnight Monday, 10/20/12.   Take these medicines the morning of surgery with A SIP OF WATER: albuterol (PROVENTIL HFA;VENTOLIN HFA), clonazePAM (KLONOPIN), oxyCODONE-acetaminophen (PERCOCET) - if needed, propranolol (INDERAL), ziprasidone (GEODON),  baclofen (LIORESAL)  Discontinue Aspirin, Coumadin, Plavix, Effient and herbal medication 7 days prior to surgery.              Do not wear jewelry, make-up or nail polish.  Do not wear lotions, powders, or perfumes. You may wear deodorant.  Do not shave 48 hours prior to surgery.   Do not bring valuables to the hospital.  [facility] is not responsible for any belongings or valuables.               Contacts, dentures or bridgework may not be worn into surgery.  Leave suitcase in the car. After surgery it may be brought to your room.  For patients admitted to the hospital, discharge time is determined by your treatment team.    Special Instructions: Shower using CHG 2 nights before surgery and the night before surgery.  If you shower the day of surgery use CHG.  Use special wash - you have one bottle of CHG for all showers.  You should use approximately 1/3 of the bottle for each shower.   Please read over the following fact sheets that you were given: Pain Booklet, Coughing and Deep Breathing, MRSA Information and Surgical Site Infection Prevention

## 2012-10-13 NOTE — Progress Notes (Addendum)
Primary physician - dr. Jeanice Lim, - [facility] Cardiologist - dr. Daleen Squibb - mainly due to high blood pressure Ekg, stress, echo in epic

## 2012-10-14 ENCOUNTER — Telehealth: Payer: Self-pay | Admitting: Family Medicine

## 2012-10-14 NOTE — Telephone Encounter (Signed)
Patient has i

## 2012-10-15 ENCOUNTER — Ambulatory Visit (INDEPENDENT_AMBULATORY_CARE_PROVIDER_SITE_OTHER): Payer: Medicare Other | Admitting: Family Medicine

## 2012-10-15 VITALS — BP 132/80 | HR 78 | Temp 97.3°F | Resp 18 | Wt 208.0 lb

## 2012-10-15 DIAGNOSIS — R32 Unspecified urinary incontinence: Secondary | ICD-10-CM | POA: Insufficient documentation

## 2012-10-15 DIAGNOSIS — L089 Local infection of the skin and subcutaneous tissue, unspecified: Secondary | ICD-10-CM

## 2012-10-15 DIAGNOSIS — N390 Urinary tract infection, site not specified: Secondary | ICD-10-CM

## 2012-10-15 LAB — URINALYSIS, ROUTINE W REFLEX MICROSCOPIC
Bilirubin Urine: NEGATIVE
Leukocytes, UA: NEGATIVE
Nitrite: NEGATIVE
Protein, ur: NEGATIVE mg/dL
Urobilinogen, UA: 0.2 mg/dL (ref 0.0–1.0)

## 2012-10-15 MED ORDER — OXYBUTYNIN CHLORIDE ER 10 MG PO TB24: 10.0000 mg | ORAL_TABLET | Freq: Every day | ORAL | Status: DC

## 2012-10-15 NOTE — Patient Instructions (Signed)
Take lasix 40mg  daily for the next 3 days  Then start bladder pill for incontinence Use topical antibiotic for the finger

## 2012-10-15 NOTE — Assessment & Plan Note (Signed)
Trial of ditropan

## 2012-10-15 NOTE — Assessment & Plan Note (Signed)
I see no signs of infection or felon on ingrown nail on the thumb, She does have some erythema around some scabs where she nicked herself She can use topical antibiotic cream for that. No other meds needed

## 2012-10-15 NOTE — Progress Notes (Signed)
  Subjective:    Patient ID: Tonya Sullivan, female    DOB: [birth year], 57 y.o.   MRN: [id number]  HPI  Patient here with concern for infection on her right thumb. She noticed last night that she had some redness and swelling beneath her nail then she expressed some white pus per report. She dribbling about appointment on it and has not had any pus since then. He is also concerned about her incontinence. Is worsened over the past couple months. She thought it was a do to I nerve in her back however her MRI did not show any significant spinal stenosis or nerve root encouragement. She states when her bladder is full she tends to leak and she now has to wear poise pads. She also has some frequency. Denies any dysuria.   Review of Systems  GEN- denies fatigue, fever, weight loss,weakness, recent illness HEENT- denies eye drainage, change in vision, nasal discharge, CVS- denies chest pain, palpitations RESP- denies SOB, cough, wheeze ABD- denies N/V, change in stools, abd pain GU- denies dysuria, hematuria, dribbling, +incontinence MSK- denies joint pain, muscle aches, injury Neuro- denies headache, dizziness, syncope, seizure activity      Objective:   Physical Exam  GEN- NAD, alert and oriented x3 Neuro- CNII-XII in tact, motor in tact, sensation LE grossly in tact, moving LE equally bilat, gait at baseline. Pulses- Radial 2+ Skin- ecchymosis bilateral arms,  few scabs, Right thumb no erythema, no fluctuance or drainage from nailfolds, no felon seen, Nail in tact       Assessment & Plan:

## 2012-10-17 ENCOUNTER — Ambulatory Visit (INDEPENDENT_AMBULATORY_CARE_PROVIDER_SITE_OTHER): Payer: Medicare Other | Admitting: Psychiatry

## 2012-10-17 DIAGNOSIS — F319 Bipolar disorder, unspecified: Secondary | ICD-10-CM
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
## 2012-10-17 NOTE — Patient Instructions (Signed)
Discussed orally 

## 2012-10-20 ENCOUNTER — Ambulatory Visit (INDEPENDENT_AMBULATORY_CARE_PROVIDER_SITE_OTHER): Payer: Medicare Other | Admitting: Psychiatry

## 2012-10-20 ENCOUNTER — Encounter (HOSPITAL_COMMUNITY): Payer: Self-pay | Admitting: Psychiatry

## 2012-10-20 ENCOUNTER — Ambulatory Visit (HOSPITAL_COMMUNITY): Payer: Self-pay | Admitting: Psychiatry

## 2012-10-20 VITALS — BP 160/100 | Ht 67.0 in | Wt 202.0 lb

## 2012-10-20 DIAGNOSIS — R52 Pain, unspecified: Secondary | ICD-10-CM

## 2012-10-20 DIAGNOSIS — F332 Major depressive disorder, recurrent severe without psychotic features: Secondary | ICD-10-CM

## 2012-10-20 DIAGNOSIS — F411 Generalized anxiety disorder: Secondary | ICD-10-CM

## 2012-10-20 DIAGNOSIS — F329 Major depressive disorder, single episode, unspecified: Secondary | ICD-10-CM

## 2012-10-20 DIAGNOSIS — F319 Bipolar disorder, unspecified: Secondary | ICD-10-CM

## 2012-10-20 MED ORDER — AMITRIPTYLINE HCL 50 MG PO TABS: 100.0000 mg | ORAL_TABLET | Freq: Every day | ORAL | Status: DC

## 2012-10-20 MED ORDER — DULOXETINE HCL 30 MG PO CPEP: 30.0000 mg | ORAL_CAPSULE | Freq: Every day | ORAL | Status: DC

## 2012-10-20 MED ORDER — PROPRANOLOL HCL 10 MG PO TABS: 10.0000 mg | ORAL_TABLET | Freq: Three times a day (TID) | ORAL | Status: DC

## 2012-10-20 MED ORDER — CLONAZEPAM 1 MG PO TABS: 1.0000 mg | ORAL_TABLET | Freq: Two times a day (BID) | ORAL | Status: DC

## 2012-10-20 MED ORDER — CEFAZOLIN SODIUM-DEXTROSE 2-3 GM-% IV SOLR
2.0000 g | INTRAVENOUS | Status: AC
  Administered 2012-10-21: 2 g via INTRAVENOUS
  Filled 2012-10-20: qty 50
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
## 2012-10-21 ENCOUNTER — Encounter (HOSPITAL_COMMUNITY)
Admission: RE | Disposition: A | Discharge status: Home / Self Care | Payer: Self-pay | Source: Ambulatory Visit | Attending: Neurological Surgery

## 2012-10-21 ENCOUNTER — Inpatient Hospital Stay (HOSPITAL_COMMUNITY)
Admission: RE | Admit: 2012-10-21 | Discharge: 2012-10-23 | DRG: 472 | Disposition: A | Discharge status: Home / Self Care | Payer: Medicare Other | Source: Ambulatory Visit | Attending: Neurological Surgery | Admitting: Neurological Surgery

## 2012-10-21 ENCOUNTER — Telehealth: Payer: Self-pay | Admitting: Family Medicine

## 2012-10-21 ENCOUNTER — Inpatient Hospital Stay (HOSPITAL_COMMUNITY): Payer: Medicare Other | Admitting: Anesthesiology

## 2012-10-21 ENCOUNTER — Encounter (HOSPITAL_COMMUNITY): Payer: Self-pay | Admitting: *Deleted

## 2012-10-21 ENCOUNTER — Encounter (HOSPITAL_COMMUNITY): Payer: Medicare Other | Admitting: Anesthesiology

## 2012-10-21 ENCOUNTER — Inpatient Hospital Stay (HOSPITAL_COMMUNITY): Payer: Medicare Other

## 2012-10-21 DIAGNOSIS — Z23 Encounter for immunization: Secondary | ICD-10-CM

## 2012-10-21 DIAGNOSIS — J4489 Other specified chronic obstructive pulmonary disease: Secondary | ICD-10-CM | POA: Diagnosis present

## 2012-10-21 DIAGNOSIS — Z79899 Other long term (current) drug therapy: Secondary | ICD-10-CM

## 2012-10-21 DIAGNOSIS — Z888 Allergy status to other drugs, medicaments and biological substances status: Secondary | ICD-10-CM

## 2012-10-21 DIAGNOSIS — F319 Bipolar disorder, unspecified: Secondary | ICD-10-CM | POA: Diagnosis present

## 2012-10-21 DIAGNOSIS — M5137 Other intervertebral disc degeneration, lumbosacral region: Secondary | ICD-10-CM | POA: Diagnosis present

## 2012-10-21 DIAGNOSIS — M47812 Spondylosis without myelopathy or radiculopathy, cervical region: Principal | ICD-10-CM | POA: Diagnosis present

## 2012-10-21 DIAGNOSIS — Z8673 Personal history of transient ischemic attack (TIA), and cerebral infarction without residual deficits: Secondary | ICD-10-CM

## 2012-10-21 DIAGNOSIS — IMO0002 Reserved for concepts with insufficient information to code with codable children: Secondary | ICD-10-CM

## 2012-10-21 DIAGNOSIS — I10 Essential (primary) hypertension: Secondary | ICD-10-CM | POA: Diagnosis present

## 2012-10-21 DIAGNOSIS — Z8249 Family history of ischemic heart disease and other diseases of the circulatory system: Secondary | ICD-10-CM

## 2012-10-21 DIAGNOSIS — J449 Chronic obstructive pulmonary disease, unspecified: Secondary | ICD-10-CM | POA: Diagnosis present

## 2012-10-21 DIAGNOSIS — Z6379 Other stressful life events affecting family and household: Secondary | ICD-10-CM

## 2012-10-21 DIAGNOSIS — Z882 Allergy status to sulfonamides status: Secondary | ICD-10-CM

## 2012-10-21 DIAGNOSIS — E785 Hyperlipidemia, unspecified: Secondary | ICD-10-CM | POA: Diagnosis present

## 2012-10-21 DIAGNOSIS — M51379 Other intervertebral disc degeneration, lumbosacral region without mention of lumbar back pain or lower extremity pain: Secondary | ICD-10-CM | POA: Diagnosis present

## 2012-10-21 DIAGNOSIS — G8929 Other chronic pain: Secondary | ICD-10-CM | POA: Diagnosis present

## 2012-10-21 DIAGNOSIS — F17200 Nicotine dependence, unspecified, uncomplicated: Secondary | ICD-10-CM | POA: Diagnosis present

## 2012-10-21 DIAGNOSIS — E2749 Other adrenocortical insufficiency: Secondary | ICD-10-CM | POA: Diagnosis present

## 2012-10-21 DIAGNOSIS — F411 Generalized anxiety disorder: Secondary | ICD-10-CM | POA: Diagnosis present

## 2012-10-21 DIAGNOSIS — Z9089 Acquired absence of other organs: Secondary | ICD-10-CM

## 2012-10-21 DIAGNOSIS — Z818 Family history of other mental and behavioral disorders: Secondary | ICD-10-CM

## 2012-10-21 DIAGNOSIS — K219 Gastro-esophageal reflux disease without esophagitis: Secondary | ICD-10-CM | POA: Diagnosis present

## 2012-10-21 HISTORY — PX: ANTERIOR CERVICAL DECOMP/DISCECTOMY FUSION: SHX1161

## 2012-10-21 SURGERY — ANTERIOR CERVICAL DECOMPRESSION/DISCECTOMY FUSION 1 LEVEL
Anesthesia: General | Wound class: Clean

## 2012-10-21 MED ORDER — SODIUM CHLORIDE 0.9 % IJ SOLN
3.0000 mL | Freq: Two times a day (BID) | INTRAMUSCULAR | Status: DC
  Administered 2012-10-21 – 2012-10-22 (×3): 3 mL via INTRAVENOUS

## 2012-10-21 MED ORDER — OXYBUTYNIN CHLORIDE ER 10 MG PO TB24
10.0000 mg | ORAL_TABLET | Freq: Every day | ORAL | Status: DC
  Administered 2012-10-21 – 2012-10-23 (×3): 10 mg via ORAL
  Filled 2012-10-21 (×3): qty 1

## 2012-10-21 MED ORDER — PHENOL 1.4 % MT LIQD
1.0000 | OROMUCOSAL | Status: DC | PRN
  Administered 2012-10-21: 1 via OROMUCOSAL
  Filled 2012-10-21: qty 177

## 2012-10-21 MED ORDER — GLYCOPYRROLATE 0.2 MG/ML IJ SOLN
INTRAMUSCULAR | Status: DC | PRN
  Administered 2012-10-21: 0.6 mg via INTRAVENOUS

## 2012-10-21 MED ORDER — OXYCODONE HCL 5 MG/5ML PO SOLN: 5.0000 mg | Freq: Once | ORAL | Status: AC | PRN

## 2012-10-21 MED ORDER — ALBUTEROL SULFATE HFA 108 (90 BASE) MCG/ACT IN AERS
2.0000 | INHALATION_SPRAY | Freq: Three times a day (TID) | RESPIRATORY_TRACT | Status: DC
  Administered 2012-10-21 – 2012-10-22 (×2): 2 via RESPIRATORY_TRACT
  Filled 2012-10-21: qty 6.7

## 2012-10-21 MED ORDER — PROPRANOLOL HCL 10 MG PO TABS
10.0000 mg | ORAL_TABLET | Freq: Three times a day (TID) | ORAL | Status: DC
  Administered 2012-10-21 – 2012-10-23 (×5): 10 mg via ORAL
  Filled 2012-10-21 (×7): qty 1

## 2012-10-21 MED ORDER — PREDNISONE 5 MG PO TABS
5.0000 mg | ORAL_TABLET | Freq: Every day | ORAL | Status: DC
  Administered 2012-10-22 – 2012-10-23 (×2): 5 mg via ORAL
  Filled 2012-10-21 (×2): qty 1

## 2012-10-21 MED ORDER — METHOCARBAMOL 100 MG/ML IJ SOLN
500.0000 mg | Freq: Four times a day (QID) | INTRAVENOUS | Status: DC | PRN
  Filled 2012-10-21: qty 5

## 2012-10-21 MED ORDER — LACTATED RINGERS IV SOLN
INTRAVENOUS | Status: DC | PRN
  Administered 2012-10-21 (×2): via INTRAVENOUS

## 2012-10-21 MED ORDER — OXYCODONE-ACETAMINOPHEN 5-325 MG PO TABS
1.0000 | ORAL_TABLET | ORAL | Status: DC | PRN
  Administered 2012-10-22 – 2012-10-23 (×4): 2 via ORAL
  Filled 2012-10-21 (×4): qty 2

## 2012-10-21 MED ORDER — LIDOCAINE-EPINEPHRINE 1 %-1:100000 IJ SOLN
INTRAMUSCULAR | Status: DC | PRN
  Administered 2012-10-21: 2.5 mL via INTRADERMAL

## 2012-10-21 MED ORDER — PREDNISONE 2.5 MG PO TABS
2.5000 mg | ORAL_TABLET | Freq: Every day | ORAL | Status: DC
  Administered 2012-10-22 – 2012-10-23 (×2): 2.5 mg via ORAL
  Filled 2012-10-21 (×2): qty 1

## 2012-10-21 MED ORDER — CLONAZEPAM 0.5 MG PO TABS
1.0000 mg | ORAL_TABLET | Freq: Two times a day (BID) | ORAL | Status: DC
  Administered 2012-10-21 – 2012-10-23 (×4): 1 mg via ORAL
  Filled 2012-10-21 (×4): qty 2

## 2012-10-21 MED ORDER — MIDAZOLAM HCL 5 MG/5ML IJ SOLN
INTRAMUSCULAR | Status: DC | PRN
  Administered 2012-10-21: 2 mg via INTRAVENOUS

## 2012-10-21 MED ORDER — METHOCARBAMOL 500 MG PO TABS
500.0000 mg | ORAL_TABLET | Freq: Four times a day (QID) | ORAL | Status: DC | PRN
  Administered 2012-10-21 – 2012-10-23 (×4): 500 mg via ORAL
  Filled 2012-10-21 (×4): qty 1

## 2012-10-21 MED ORDER — THROMBIN 5000 UNITS EX SOLR
CUTANEOUS | Status: DC | PRN
  Administered 2012-10-21 (×2): 5000 [IU] via TOPICAL

## 2012-10-21 MED ORDER — HYDROMORPHONE HCL PF 1 MG/ML IJ SOLN
0.5000 mg | INTRAMUSCULAR | Status: DC | PRN
  Administered 2012-10-22: 1 mg via INTRAVENOUS
  Filled 2012-10-21: qty 1

## 2012-10-21 MED ORDER — SODIUM CHLORIDE 0.9 % IV SOLN: 250.0000 mL | INTRAVENOUS | Status: DC

## 2012-10-21 MED ORDER — HYDROMORPHONE HCL PF 1 MG/ML IJ SOLN
0.2500 mg | INTRAMUSCULAR | Status: DC | PRN
  Administered 2012-10-21 (×4): 0.5 mg via INTRAVENOUS

## 2012-10-21 MED ORDER — ALBUTEROL SULFATE (5 MG/ML) 0.5% IN NEBU: 2.5000 mg | INHALATION_SOLUTION | Freq: Four times a day (QID) | RESPIRATORY_TRACT | Status: DC | PRN

## 2012-10-21 MED ORDER — SENNA 8.6 MG PO TABS
1.0000 | ORAL_TABLET | Freq: Two times a day (BID) | ORAL | Status: DC
  Administered 2012-10-21 – 2012-10-23 (×4): 8.6 mg via ORAL
  Filled 2012-10-21 (×6): qty 1

## 2012-10-21 MED ORDER — HYDROMORPHONE HCL PF 1 MG/ML IJ SOLN
INTRAMUSCULAR | Status: AC
  Filled 2012-10-21: qty 1

## 2012-10-21 MED ORDER — ONDANSETRON HCL 4 MG/2ML IJ SOLN: 4.0000 mg | Freq: Once | INTRAMUSCULAR | Status: DC | PRN

## 2012-10-21 MED ORDER — OXYCODONE-ACETAMINOPHEN 5-325 MG PO TABS
2.0000 | ORAL_TABLET | Freq: Three times a day (TID) | ORAL | Status: DC | PRN
  Administered 2012-10-21: 2 via ORAL
  Filled 2012-10-21: qty 2

## 2012-10-21 MED ORDER — OXYCODONE HCL 5 MG PO TABS
ORAL_TABLET | ORAL | Status: AC
  Filled 2012-10-21: qty 1

## 2012-10-21 MED ORDER — HYDROCHLOROTHIAZIDE 25 MG PO TABS
25.0000 mg | ORAL_TABLET | Freq: Every day | ORAL | Status: DC
  Administered 2012-10-22 – 2012-10-23 (×2): 25 mg via ORAL
  Filled 2012-10-21 (×2): qty 1

## 2012-10-21 MED ORDER — MENTHOL 3 MG MT LOZG: 1.0000 | LOZENGE | OROMUCOSAL | Status: DC | PRN

## 2012-10-21 MED ORDER — FENTANYL CITRATE 0.05 MG/ML IJ SOLN
INTRAMUSCULAR | Status: DC | PRN
  Administered 2012-10-21: 100 ug via INTRAVENOUS
  Administered 2012-10-21: 150 ug via INTRAVENOUS

## 2012-10-21 MED ORDER — SODIUM CHLORIDE 0.9 % IJ SOLN: 3.0000 mL | INTRAMUSCULAR | Status: DC | PRN

## 2012-10-21 MED ORDER — ALUM & MAG HYDROXIDE-SIMETH 200-200-20 MG/5ML PO SUSP: 30.0000 mL | Freq: Four times a day (QID) | ORAL | Status: DC | PRN

## 2012-10-21 MED ORDER — INFLUENZA VAC SPLIT QUAD 0.5 ML IM SUSP
0.5000 mL | INTRAMUSCULAR | Status: AC
  Administered 2012-10-22: 0.5 mL via INTRAMUSCULAR
  Filled 2012-10-21: qty 0.5

## 2012-10-21 MED ORDER — NEOSTIGMINE METHYLSULFATE 1 MG/ML IJ SOLN
INTRAMUSCULAR | Status: DC | PRN
  Administered 2012-10-21: 3 mg via INTRAVENOUS

## 2012-10-21 MED ORDER — SODIUM CHLORIDE 0.9 % IR SOLN
Status: DC | PRN
  Administered 2012-10-21: 16:00:00

## 2012-10-21 MED ORDER — POLYETHYLENE GLYCOL 3350 17 G PO PACK
17.0000 g | PACK | Freq: Every day | ORAL | Status: DC | PRN
  Filled 2012-10-21: qty 1

## 2012-10-21 MED ORDER — FLEET ENEMA 7-19 GM/118ML RE ENEM
1.0000 | ENEMA | Freq: Once | RECTAL | Status: AC | PRN
  Filled 2012-10-21: qty 1

## 2012-10-21 MED ORDER — HEMOSTATIC AGENTS (NO CHARGE) OPTIME
TOPICAL | Status: DC | PRN
  Administered 2012-10-21: 1 via TOPICAL

## 2012-10-21 MED ORDER — TIOTROPIUM BROMIDE MONOHYDRATE 18 MCG IN CAPS
18.0000 ug | ORAL_CAPSULE | Freq: Every day | RESPIRATORY_TRACT | Status: DC
  Administered 2012-10-22 – 2012-10-23 (×2): 18 ug via RESPIRATORY_TRACT
  Filled 2012-10-21: qty 5

## 2012-10-21 MED ORDER — SIMVASTATIN 20 MG PO TABS
20.0000 mg | ORAL_TABLET | Freq: Every day | ORAL | Status: DC
  Administered 2012-10-22: 20 mg via ORAL
  Filled 2012-10-21 (×2): qty 1

## 2012-10-21 MED ORDER — BISACODYL 10 MG RE SUPP: 10.0000 mg | Freq: Every day | RECTAL | Status: DC | PRN

## 2012-10-21 MED ORDER — EPHEDRINE SULFATE 50 MG/ML IJ SOLN
INTRAMUSCULAR | Status: DC | PRN
  Administered 2012-10-21: 10 mg via INTRAVENOUS

## 2012-10-21 MED ORDER — LACTATED RINGERS IV SOLN
INTRAVENOUS | Status: DC
  Administered 2012-10-21: 12:00:00 via INTRAVENOUS

## 2012-10-21 MED ORDER — DOCUSATE SODIUM 100 MG PO CAPS
100.0000 mg | ORAL_CAPSULE | Freq: Two times a day (BID) | ORAL | Status: DC
  Administered 2012-10-21 – 2012-10-23 (×4): 100 mg via ORAL
  Filled 2012-10-21 (×4): qty 1

## 2012-10-21 MED ORDER — AMITRIPTYLINE HCL 100 MG PO TABS
100.0000 mg | ORAL_TABLET | Freq: Every day | ORAL | Status: DC
  Administered 2012-10-21 – 2012-10-22 (×2): 100 mg via ORAL
  Filled 2012-10-21 (×3): qty 1

## 2012-10-21 MED ORDER — NICOTINE 14 MG/24HR TD PT24
14.0000 mg | MEDICATED_PATCH | Freq: Every day | TRANSDERMAL | Status: DC
  Administered 2012-10-21 – 2012-10-23 (×3): 14 mg via TRANSDERMAL
  Filled 2012-10-21 (×3): qty 1

## 2012-10-21 MED ORDER — ROCURONIUM BROMIDE 100 MG/10ML IV SOLN
INTRAVENOUS | Status: DC | PRN
  Administered 2012-10-21: 50 mg via INTRAVENOUS

## 2012-10-21 MED ORDER — OXYCODONE HCL 5 MG PO TABS
5.0000 mg | ORAL_TABLET | Freq: Once | ORAL | Status: AC | PRN
Start: 2012-10-21 — End: 2012-10-21
  Administered 2012-10-21: 5 mg via ORAL

## 2012-10-21 MED ORDER — ONDANSETRON HCL 4 MG/2ML IJ SOLN: 4.0000 mg | INTRAMUSCULAR | Status: DC | PRN

## 2012-10-21 MED ORDER — DULOXETINE HCL 30 MG PO CPEP
30.0000 mg | ORAL_CAPSULE | Freq: Every day | ORAL | Status: DC
  Administered 2012-10-21 – 2012-10-23 (×3): 30 mg via ORAL
  Filled 2012-10-21 (×3): qty 1

## 2012-10-21 MED ORDER — ALBUTEROL SULFATE HFA 108 (90 BASE) MCG/ACT IN AERS: 2.0000 | INHALATION_SPRAY | Freq: Three times a day (TID) | RESPIRATORY_TRACT | Status: DC

## 2012-10-21 MED ORDER — PNEUMOCOCCAL VAC POLYVALENT 25 MCG/0.5ML IJ INJ
0.5000 mL | INJECTION | INTRAMUSCULAR | Status: AC
  Administered 2012-10-22: 0.5 mL via INTRAMUSCULAR
  Filled 2012-10-21: qty 0.5

## 2012-10-21 MED ORDER — DEXAMETHASONE SODIUM PHOSPHATE 10 MG/ML IJ SOLN
INTRAMUSCULAR | Status: DC | PRN
  Administered 2012-10-21: 10 mg via INTRAVENOUS

## 2012-10-21 MED ORDER — BUPIVACAINE HCL (PF) 0.25 % IJ SOLN
INTRAMUSCULAR | Status: DC | PRN
  Administered 2012-10-21: 2.5 mL

## 2012-10-21 MED ORDER — BACLOFEN 10 MG PO TABS
10.0000 mg | ORAL_TABLET | Freq: Two times a day (BID) | ORAL | Status: DC
  Administered 2012-10-21 – 2012-10-23 (×4): 10 mg via ORAL
  Filled 2012-10-21 (×5): qty 1

## 2012-10-21 MED ORDER — ONDANSETRON HCL 4 MG/2ML IJ SOLN
INTRAMUSCULAR | Status: DC | PRN
  Administered 2012-10-21: 4 mg via INTRAVENOUS

## 2012-10-21 MED ORDER — LOSARTAN POTASSIUM 50 MG PO TABS
50.0000 mg | ORAL_TABLET | Freq: Every day | ORAL | Status: DC
  Administered 2012-10-22 – 2012-10-23 (×2): 50 mg via ORAL
  Filled 2012-10-21 (×2): qty 1

## 2012-10-21 MED ORDER — MEPERIDINE HCL 25 MG/ML IJ SOLN: 6.2500 mg | INTRAMUSCULAR | Status: DC | PRN

## 2012-10-21 MED ORDER — PROPOFOL 10 MG/ML IV BOLUS
INTRAVENOUS | Status: DC | PRN
  Administered 2012-10-21: 150 mg via INTRAVENOUS

## 2012-10-21 MED ORDER — 0.9 % SODIUM CHLORIDE (POUR BTL) OPTIME
TOPICAL | Status: DC | PRN
  Administered 2012-10-21: 1000 mL

## 2012-10-21 SURGICAL SUPPLY — 58 items
ADH SKN CLS APL DERMABOND .7 (GAUZE/BANDAGES/DRESSINGS) ×1
BAG DECANTER FOR FLEXI CONT (MISCELLANEOUS) ×2 IMPLANT
BANDAGE GAUZE ELAST BULKY 4 IN (GAUZE/BANDAGES/DRESSINGS) ×2 IMPLANT
BIT DRILL 2.3 12 FIXED (INSTRUMENTS) IMPLANT
BIT DRILL NEURO 2X3.1 SFT TUCH (MISCELLANEOUS) ×1 IMPLANT
BUR BARREL STRAIGHT FLUTE 4.0 (BURR) ×2 IMPLANT
CANISTER SUCTION 2500CC (MISCELLANEOUS) ×2 IMPLANT
CONT SPEC 4OZ CLIKSEAL STRL BL (MISCELLANEOUS) ×3 IMPLANT
DECANTER SPIKE VIAL GLASS SM (MISCELLANEOUS) ×2 IMPLANT
DERMABOND ADVANCED (GAUZE/BANDAGES/DRESSINGS) ×1
DERMABOND ADVANCED .7 DNX12 (GAUZE/BANDAGES/DRESSINGS) ×1 IMPLANT
DRAPE LAPAROTOMY 100X72 PEDS (DRAPES) ×2 IMPLANT
DRAPE MICROSCOPE LEICA (MISCELLANEOUS) IMPLANT
DRAPE POUCH INSTRU U-SHP 10X18 (DRAPES) ×2 IMPLANT
DRESSING TELFA 8X3 (GAUZE/BANDAGES/DRESSINGS) ×1 IMPLANT
DRILL 12MM (INSTRUMENTS) ×2
DRILL NEURO 2X3.1 SOFT TOUCH (MISCELLANEOUS) ×2
DRSG OPSITE 4X5.5 SM (GAUZE/BANDAGES/DRESSINGS) ×2 IMPLANT
DURAPREP 6ML APPLICATOR 50/CS (WOUND CARE) ×2 IMPLANT
ELECT REM PT RETURN 9FT ADLT (ELECTROSURGICAL) ×2
ELECTRODE REM PT RTRN 9FT ADLT (ELECTROSURGICAL) ×1 IMPLANT
GAUZE SPONGE 4X4 16PLY XRAY LF (GAUZE/BANDAGES/DRESSINGS) IMPLANT
GLOVE BIO SURGEON STRL SZ7.5 (GLOVE) IMPLANT
GLOVE BIOGEL PI IND STRL 7.5 (GLOVE) IMPLANT
GLOVE BIOGEL PI IND STRL 8.5 (GLOVE) ×1 IMPLANT
GLOVE BIOGEL PI INDICATOR 7.5 (GLOVE) ×2
GLOVE BIOGEL PI INDICATOR 8.5 (GLOVE) ×1
GLOVE ECLIPSE 7.5 STRL STRAW (GLOVE) ×3 IMPLANT
GLOVE ECLIPSE 8.5 STRL (GLOVE) ×2 IMPLANT
GLOVE EXAM NITRILE LRG STRL (GLOVE) IMPLANT
GLOVE EXAM NITRILE MD LF STRL (GLOVE) IMPLANT
GLOVE EXAM NITRILE XL STR (GLOVE) IMPLANT
GLOVE EXAM NITRILE XS STR PU (GLOVE) IMPLANT
GOWN BRE IMP SLV AUR LG STRL (GOWN DISPOSABLE) ×1 IMPLANT
GOWN BRE IMP SLV AUR XL STRL (GOWN DISPOSABLE) ×2 IMPLANT
GOWN STRL REIN 2XL LVL4 (GOWN DISPOSABLE) ×2 IMPLANT
HEAD HALTER (SOFTGOODS) ×2 IMPLANT
KIT BASIN OR (CUSTOM PROCEDURE TRAY) ×2 IMPLANT
KIT ROOM TURNOVER OR (KITS) ×2 IMPLANT
NDL SPNL 18GX3.5 QUINCKE PK (NEEDLE) IMPLANT
NDL SPNL 22GX3.5 QUINCKE BK (NEEDLE) ×1 IMPLANT
NEEDLE HYPO 22GX1.5 SAFETY (NEEDLE) ×2 IMPLANT
NEEDLE SPNL 18GX3.5 QUINCKE PK (NEEDLE) ×2 IMPLANT
NEEDLE SPNL 22GX3.5 QUINCKE BK (NEEDLE) ×2 IMPLANT
NS IRRIG 1000ML POUR BTL (IV SOLUTION) ×2 IMPLANT
PACK LAMINECTOMY NEURO (CUSTOM PROCEDURE TRAY) ×2 IMPLANT
PAD ARMBOARD 7.5X6 YLW CONV (MISCELLANEOUS) ×6 IMPLANT
PLATE 14MM (Plate) ×1 IMPLANT
RUBBERBAND STERILE (MISCELLANEOUS) ×2 IMPLANT
SCREW 12MM (Screw) ×4 IMPLANT
SPACER ASSEM CERV LORD 6M (Spacer) ×1 IMPLANT
SPONGE INTESTINAL PEANUT (DISPOSABLE) ×2 IMPLANT
SPONGE SURGIFOAM ABS GEL SZ50 (HEMOSTASIS) ×2 IMPLANT
SUT VIC AB 3-0 SH 8-18 (SUTURE) ×2 IMPLANT
SYR 20ML ECCENTRIC (SYRINGE) ×2 IMPLANT
TOWEL OR 17X24 6PK STRL BLUE (TOWEL DISPOSABLE) ×2 IMPLANT
TOWEL OR 17X26 10 PK STRL BLUE (TOWEL DISPOSABLE) ×2 IMPLANT
WATER STERILE IRR 1000ML POUR (IV SOLUTION) ×2 IMPLANT

## 2012-10-21 NOTE — H&P (Addendum)
Tonya Sullivan is an 57 y.o. female.   Chief Complaint: Neck and shoulder pain HPI: Patient is a 57 year old individual who has had significant neck shoulder and arm pain. She has obtained degenerative changes at C6-C7.  She has failed extensive efforts at conservative management, and is now admitted to undergo surgical decompression and stabilization.  Past Medical History  Diagnosis Date  . COPD (chronic obstructive pulmonary disease)   . Depression   . Chronic back pain     DDD, disc bulge, radiculopathy, spinal stenosis  . Hyperlipidemia   . Anxiety   . Bipolar disorder, unspecified   . Carotid artery calcification   . DDD (degenerative disc disease), lumbosacral   . Spinal stenosis, lumbar   . Melanoma     removed 1 week ago at Dr Laurena Slimmer office  . Thyroid disease   . Autoimmune thyroiditis   . Hypertension   . Dysrhythmia     palpitations  . TIA (transient ischemic attack)     august 2014  . Pneumonia     hx of   . GERD (gastroesophageal reflux disease)   . H/O dizziness   . Neuropathic pain   . Adrenal gland dysfunction     Addison's disease    Past Surgical History  Procedure Laterality Date  . Appendectomy    . Tonsillectomy    . Cholecystectomy    . Lump left breast      removed- benign  . Abdominal hysterectomy      tubal pregnancy  . Inguinal hernia repair    . Stomach surgery      ?holes in esophgous  . Ectopic pregnancy surgery    . Back surgery      x 5;1984;1989;;1999;2000;2010  . Colonoscopy  01/25/2012    Procedure: COLONOSCOPY;  Surgeon: Malissa Hippo, MD;  Location: [facility];  Service: Endoscopy;  Laterality: N/A;  130  . Esophagogastroduodenoscopy (egd) with esophageal dilation N/A 08/13/2012    Procedure: ESOPHAGOGASTRODUODENOSCOPY (EGD) WITH ESOPHAGEAL DILATION;  Surgeon: Malissa Hippo, MD;  Location: [facility];  Service: Endoscopy;  Laterality: N/A;  315  . Breast surgery      left breast    Family History  Problem Relation  Age of Onset  . Hypertension Mother   . Heart disease Mother   . Heart attack Mother   . Anxiety disorder Mother   . Cancer Father     lung cancer  . Heart attack Father   . Alcohol abuse Father   . Hypertension Brother   . Alcohol abuse Brother   . Bipolar disorder Brother   . ADD / ADHD Brother   . Drug abuse Brother   . OCD Brother   . Alcohol abuse Sister   . Bipolar disorder Sister   . Anxiety disorder Sister   . ADD / ADHD Sister   . Drug abuse Sister   . Physical abuse Sister   . Sexual abuse Sister   . Alcohol abuse Brother   . Bipolar disorder Brother   . ADD / ADHD Brother   . Drug abuse Brother   . Alcohol abuse Sister   . Bipolar disorder Sister   . Anxiety disorder Sister   . ADD / ADHD Sister   . Physical abuse Sister   . Sexual abuse Sister   . Alcohol abuse Brother   . Bipolar disorder Brother   . ADD / ADHD Brother   . Bipolar disorder Brother   . ADD /  ADHD Brother   . Alcohol abuse Brother   . Paranoid behavior Brother   . Physical abuse Brother   . Sexual abuse Brother   . Bipolar disorder Maternal Aunt   . Bipolar disorder Paternal Aunt   . Bipolar disorder Maternal Uncle   . Bipolar disorder Paternal Uncle   . Bipolar disorder Maternal Grandfather   . Alcohol abuse Maternal Grandfather   . Bipolar disorder Maternal Grandmother   . Alcohol abuse Maternal Grandmother   . Anxiety disorder Maternal Grandmother   . Dementia Maternal Grandmother   . Bipolar disorder Paternal Grandfather   . Alcohol abuse Paternal Grandfather   . Bipolar disorder Paternal Grandmother   . Alcohol abuse Paternal Grandmother   . Drug abuse Paternal Grandmother   . Bipolar disorder Maternal Uncle   . Schizophrenia Neg Hx   . Seizures Neg Hx    Social History:  reports that she has been smoking Cigarettes.  She has a 20 pack-year smoking history. She has never used smokeless tobacco. She reports that she does not drink alcohol or use illicit drugs.  Allergies:   Allergies  Allergen Reactions  . Codeine Itching  . Cyclobenzaprine Hives  . Darvocet [Propoxyphene-Acetaminophen] Rash  . Abilify [Aripiprazole] Other (See Comments)    tremors  . Ace Inhibitors   . Adhesive [Tape] Hives  . Iodine Hives  . Levofloxacin Hives and Hypertension  . Sulfa Antibiotics Itching    Medications Prior to Admission  Medication Sig Dispense Refill  . albuterol (PROVENTIL HFA;VENTOLIN HFA) 108 (90 BASE) MCG/ACT inhaler Inhale 2 puffs into the lungs 3 (three) times daily.      Marland Kitchen albuterol (PROVENTIL) (2.5 MG/3ML) 0.083% nebulizer solution Take 3 mLs (2.5 mg total) by nebulization every 4 (four) hours as needed for wheezing.  75 mL  3  . amitriptyline (ELAVIL) 50 MG tablet Take 2 tablets (100 mg total) by mouth at bedtime.  60 tablet  2  . baclofen (LIORESAL) 10 MG tablet Take 10 mg by mouth 2 (two) times daily.      . clonazePAM (KLONOPIN) 1 MG tablet Take 1 tablet (1 mg total) by mouth 2 (two) times daily.  60 tablet  1  . DULoxetine (CYMBALTA) 30 MG capsule Take 1 capsule (30 mg total) by mouth daily.  30 capsule  2  . hydrochlorothiazide (HYDRODIURIL) 25 MG tablet Take 1 tablet (25 mg total) by mouth daily. For blood pressure and swelling  30 tablet  3  . losartan (COZAAR) 50 MG tablet Take 1 tablet (50 mg total) by mouth daily.  90 tablet  3  . naproxen (NAPROSYN) 500 MG tablet Take 500 mg by mouth 2 (two) times daily.      Marland Kitchen oxybutynin (DITROPAN XL) 10 MG 24 hr tablet Take 1 tablet (10 mg total) by mouth daily.  30 tablet  3  . oxyCODONE-acetaminophen (PERCOCET) 10-325 MG per tablet Take 1 tablet by mouth every 8 (eight) hours as needed for pain.      . pravastatin (PRAVACHOL) 40 MG tablet Take 1 tablet (40 mg total) by mouth every evening.  30 tablet  3  . predniSONE (DELTASONE) 2.5 MG tablet Take 2.5 mg by mouth daily.      . predniSONE (DELTASONE) 5 MG tablet Take 5 mg by mouth daily.      . propranolol (INDERAL) 10 MG tablet Take 1 tablet (10 mg total) by  mouth 3 (three) times daily.  90 tablet  2  . tiotropium (SPIRIVA  HANDIHALER) 18 MCG inhalation capsule Place 1 capsule (18 mcg total) into inhaler and inhale daily.  30 capsule  6  . lidocaine (LIDODERM) 5 % Place 1 patch onto the skin every 12 (twelve) hours. Remove & Discard patch within 12 hours or as directed by MD  30 patch  2    No results found for this or any previous visit (from the past 48 hour(s)). No results found.  Review of Systems  Constitutional: Positive for malaise/fatigue.  Eyes: Negative.   Gastrointestinal: Negative.   Genitourinary: Negative.   Musculoskeletal: Positive for back pain and neck pain.  Neurological: Positive for tingling, tremors and weakness.  Endo/Heme/Allergies: Negative.   Psychiatric/Behavioral: Negative.     Blood pressure 130/85, pulse 72, temperature 97.8 F (36.6 C), temperature source Oral, resp. rate 18, SpO2 95.00%. Physical Exam  Constitutional: She is oriented to person, place, and time. She appears well-developed and well-nourished.  HENT:  Head: Normocephalic and atraumatic.  Eyes: Conjunctivae and EOM are normal. Pupils are equal, round, and reactive to light.  Neck: Normal range of motion. Neck supple.  Cardiovascular: Normal rate and regular rhythm.   Respiratory: Effort normal and breath sounds normal.  GI: Soft. Bowel sounds are normal.  Musculoskeletal: Normal range of motion.  Neurological: She is alert and oriented to person, place, and time. She has normal reflexes.  Skin: Skin is warm and dry.  Psychiatric: She has a normal mood and affect. Her behavior is normal. Judgment and thought content normal.     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  Admit for anterior decompression arthrodesis.  Cicley Ganesh J 10/21/2012, 3:20 PM

## 2012-10-21 NOTE — Transfer of Care (Signed)
Immediate Anesthesia Transfer of Care Note  Patient: Tonya Sullivan  Procedure(s) Performed: Procedure(s) with comments: Cervical Six-Seven Anterior cervical decompression/diskectomy/fusion (N/A) - Cervical Six-Seven Anterior cervical decompression/diskectomy/fusion  Patient Location: PACU  Anesthesia Type:General  Level of Consciousness: awake, alert  and oriented  Airway & Oxygen Therapy: Patient Spontanous Breathing and Patient connected to nasal cannula oxygen  Post-op Assessment: Report given to PACU RN and Post -op Vital signs reviewed and stable  Post vital signs: Reviewed and stable  Complications: No apparent anesthesia complications

## 2012-10-21 NOTE — Anesthesia Procedure Notes (Signed)
Procedure Name: Intubation Date/Time: 10/21/2012 3:30 PM Performed by: Gwenyth Allegra Pre-anesthesia Checklist: Patient identified, Timeout performed, Emergency Drugs available, Suction available and Patient being monitored Patient Re-evaluated:Patient Re-evaluated prior to inductionOxygen Delivery Method: Circle system utilized Intubation Type: IV induction Ventilation: Mask ventilation without difficulty and Oral airway inserted - appropriate to patient size Laryngoscope Size: Mac and 3 Grade View: Grade I Tube type: Oral Tube size: 7.0 mm Number of attempts: 1 Airway Equipment and Method: Stylet Placement Confirmation: ETT inserted through vocal cords under direct vision,  breath sounds checked- equal and bilateral and positive ETCO2 Secured at: 21 cm Tube secured with: Tape Dental Injury: Teeth and Oropharynx as per pre-operative assessment

## 2012-10-21 NOTE — Op Note (Signed)
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 Asst.:Neelesh Conchita Paris, MD Indications: Patient is a 57 year old individual who is disabled. She has had significant cervical radiculopathy. She has degenerative changes at C6-C7 by foraminal stenosis. She's been advised regarding surgical decompression arthrodesis via an anterior discectomy and plate fixation. Procedure: The patient was brought to the operating room placed on the table in supine position. After the smooth induction of general endotracheal anesthesia neck was placed in 5 pounds of halter traction and prepped with alcohol and DuraPrep. After sterile draping and appropriate timeout procedure a transverse incision was created in the left side of the neck and carried down to the platysma. The plane between the sternocleidomastoid and strap muscles dissected bluntly until the prevertebral space was reached. The first identifiable disc space was noted to be C4-C5 on a localizing radiograph. The dissection was then undertaken in the longus coli muscle to allow placement of a self-retaining Caspar type retractor.  The anterior longitudinal ligament was opened at C6-C7 and ventral osteophytes were removed with a Leksell rongeur and Kerrison punch. Interspace was cleared of significant quantity of the degenerated disc material in the region of the posterior longitudinal ligament was removed. Dissection was carried out using a high-speed drill and 3-0 Karlin curettes. Uncinate processes were drilled down and removed and osteophytes from the inferior margin of the body of C6 were removed with a Kerrison 2 mm gold punch. After the central  canal and lateral recesses were well decompressed hemostasis was achieved with the bipolar cautery and some small pledgets of Gelfoam soaked in thrombin that were later irrigated away.  A 6 mm manufactured cortical cancellus allograft was inserted into the interspace.  Next the retractor was removed and a 14 mm trestle plate was placed over the vertebral bodies and secured with 12 mm variable angle screws. A final localizing radiograph identified the position of the surgical construct. The stasis was achieved in the soft tissues and then the platysma was closed with 3-0 Vicryl in an interrupted fashion and 3-0 Vicryl was used in the subcuticular tissue. Blood loss was estimated at 5 cc

## 2012-10-21 NOTE — Anesthesia Postprocedure Evaluation (Signed)
  Anesthesia Post-op Note  Patient: Tonya Sullivan  Procedure(s) Performed: Procedure(s) with comments: Cervical Six-Seven Anterior cervical decompression/diskectomy/fusion (N/A) - Cervical Six-Seven Anterior cervical decompression/diskectomy/fusion  Patient Location: PACU  Anesthesia Type:General  Level of Consciousness: awake, alert  and oriented  Airway and Oxygen Therapy: Patient Spontanous Breathing and Patient connected to nasal cannula oxygen  Post-op Pain: mild  Post-op Assessment: Post-op Vital signs reviewed  Post-op Vital Signs: Reviewed  Complications: No apparent anesthesia complications

## 2012-10-21 NOTE — Progress Notes (Signed)
Patient ID: Tonya Sullivan, female   DOB: [birth year], 57 y.o.   MRN: [id number] Vital signs are stable. Feels fair. She requests nicotine patch. Motor function is intact.

## 2012-10-21 NOTE — Telephone Encounter (Signed)
Patient needs refill on her Albuterol inhaler .    [facility]

## 2012-10-21 NOTE — Preoperative (Signed)
Beta Blockers   Reason not to administer Beta Blockers:Not Applicable 

## 2012-10-21 NOTE — Anesthesia Preprocedure Evaluation (Signed)
Anesthesia Evaluation  Patient identified by MRN, date of birth, ID band Patient awake    Reviewed: Allergy & Precautions, H&P , NPO status , Patient's Chart, lab work & pertinent test results  Airway Mallampati: I TM Distance: >3 FB Neck ROM: Full    Dental   Pulmonary COPD         Cardiovascular hypertension, Pt. on medications     Neuro/Psych Anxiety Depression TIA   GI/Hepatic GERD-  Medicated and Controlled,  Endo/Other  Pt has Addison's Disease on Prednisone  Renal/GU      Musculoskeletal   Abdominal   Peds  Hematology   Anesthesia Other Findings   Reproductive/Obstetrics                           Anesthesia Physical Anesthesia Plan  ASA: III  Anesthesia Plan: General   Post-op Pain Management:    Induction: Intravenous  Airway Management Planned: Oral ETT  Additional Equipment:   Intra-op Plan:   Post-operative Plan: Extubation in OR  Informed Consent: I have reviewed the patients History and Physical, chart, labs and discussed the procedure including the risks, benefits and alternatives for the proposed anesthesia with the patient or authorized representative who has indicated his/her understanding and acceptance.     Plan Discussed with: CRNA and Surgeon  Anesthesia Plan Comments:         Anesthesia Quick Evaluation

## 2012-10-22 MED ORDER — OXYCODONE-ACETAMINOPHEN 5-325 MG PO TABS: 2.0000 | ORAL_TABLET | ORAL | Status: DC | PRN

## 2012-10-22 MED ORDER — METHOCARBAMOL 500 MG PO TABS: 500.0000 mg | ORAL_TABLET | Freq: Four times a day (QID) | ORAL | Status: DC | PRN

## 2012-10-22 MED ORDER — ALBUTEROL SULFATE HFA 108 (90 BASE) MCG/ACT IN AERS
2.0000 | INHALATION_SPRAY | RESPIRATORY_TRACT | Status: DC | PRN
  Filled 2012-10-22: qty 6.7

## 2012-10-22 NOTE — Progress Notes (Signed)
Subjective: Patient reports Doing reasonably well today. Some difficulty with swallowing.  Objective: Vital signs in last 24 hours: Temp:  [97.6 F (36.4 C)-98.9 F (37.2 C)] 98.5 F (36.9 C) (10/22 1937) Pulse Rate:  [67-84] 74 (10/22 1937) Resp:  [16-18] 18 (10/22 1937) BP: (100-130)/(60-92) 112/72 mmHg (10/22 1937) SpO2:  [92 %-97 %] 94 % (10/22 1937)  Intake/Output from previous day: 10/21 0701 - 10/22 0700 In: 1620 [P.O.:120; I.V.:1500] Out: 50 [Blood:50] Intake/Output this shift:    Incision is clean and dry some erythema around incision secondary to likely skin reaction.  Lab Results: No results found for this basename: WBC, HGB, HCT, PLT,  in the last 72 hours BMET No results found for this basename: NA, K, CL, CO2, GLUCOSE, BUN, CREATININE, CALCIUM,  in the last 72 hours  Studies/Results: Dg Cervical Spine 2-3 Views  10/21/2012   CLINICAL DATA:  C6-7 ACDF.  EXAM: CERVICAL SPINE - 2-3 VIEW  COMPARISON:  Cervical MRI 09/15/2012.  FINDINGS: Two cross-table lateral views of the cervical spine are submitted. Image number 1 at 1602 hr demonstrates anterior localization of the C4-5 and C5-6 disc spaces with needles.  Image number 2 at 1651 hr demonstrates interval anterior discectomy and fusion at C6-7 with an anterior plate, screws and presumed intervertebral spacer. The hardware is not well visualized due to overlap with the shoulders. A surgical sponge is present anteriorly in the surgical bed. No complications are identified.  IMPRESSION: Intraoperative views during C6-7 ACDF. The operative level is not well visualized.   Electronically Signed   By: Roxy Horseman M.D.   On: 10/21/2012 17:17    Assessment/Plan: Stable post  LOS: 1 day  Discharge in the morning when son can take her home   Stefani Dama 10/22/2012, 7:38 PM

## 2012-10-22 NOTE — Discharge Summary (Signed)
Physician Discharge Summary  Patient ID: Tonya Sullivan MRN: [id number] DOB/AGE: Feb 19, 1955 57 y.o.  Admit date: 10/21/2012 Discharge date: 10/22/2012  Admission Diagnoses: Cervical spondylosis with radiculopathy C6-C7  Discharge Diagnoses: Cervical radiculopathy, cervical spondylosis C6-C7 Addison's Active Problems:   * No active hospital problems. *   Discharged Condition: good  Hospital Course: Patient was admitted to undergo anterior cervical decompression arthrodesis. She tolerated surgery well. She is discharged home  Consults: None  Significant Diagnostic Studies: None  Treatments: Anterior cervical decompression C6-C7 arthrodesis with structural allograft  Discharge Exam: Blood pressure 112/72, pulse 74, temperature 98.5 F (36.9 C), temperature source Oral, resp. rate 18, height 5' 6.93" (1.7 m), weight 91.6 kg (201 lb 15.1 oz), SpO2 94.00%. Incisions clean and dry motor function is intact  Disposition: 01-Home or Self Care  Discharge Orders   Future Orders Complete By Expires   Call MD for:  redness, tenderness, or signs of infection (pain, swelling, redness, odor or green/yellow discharge around incision site)  As directed    Call MD for:  severe uncontrolled pain  As directed    Call MD for:  temperature >100.4  As directed    Diet - low sodium heart healthy  As directed    Discharge instructions  As directed    Comments:     Okay to shower. Do not apply salves or appointments to incision. No heavy lifting with the upper extremities greater than 15 pounds. May resume driving when not requiring pain medication and patient feels comfortable with doing so.   Increase activity slowly  As directed        Medication List         albuterol (2.5 MG/3ML) 0.083% nebulizer solution  Commonly known as:  PROVENTIL  Take 3 mLs (2.5 mg total) by nebulization every 4 (four) hours as needed for wheezing.     albuterol 108 (90 BASE) MCG/ACT inhaler  Commonly known as:   PROVENTIL HFA;VENTOLIN HFA  Inhale 2 puffs into the lungs 3 (three) times daily.     amitriptyline 50 MG tablet  Commonly known as:  ELAVIL  Take 2 tablets (100 mg total) by mouth at bedtime.     baclofen 10 MG tablet  Commonly known as:  LIORESAL  Take 10 mg by mouth 2 (two) times daily.     clonazePAM 1 MG tablet  Commonly known as:  KLONOPIN  Take 1 tablet (1 mg total) by mouth 2 (two) times daily.     DULoxetine 30 MG capsule  Commonly known as:  CYMBALTA  Take 1 capsule (30 mg total) by mouth daily.     hydrochlorothiazide 25 MG tablet  Commonly known as:  HYDRODIURIL  Take 1 tablet (25 mg total) by mouth daily. For blood pressure and swelling     lidocaine 5 %  Commonly known as:  LIDODERM  Place 1 patch onto the skin every 12 (twelve) hours. Remove & Discard patch within 12 hours or as directed by MD     losartan 50 MG tablet  Commonly known as:  COZAAR  Take 1 tablet (50 mg total) by mouth daily.     methocarbamol 500 MG tablet  Commonly known as:  ROBAXIN  Take 1 tablet (500 mg total) by mouth every 6 (six) hours as needed.     naproxen 500 MG tablet  Commonly known as:  NAPROSYN  Take 500 mg by mouth 2 (two) times daily.     oxybutynin 10 MG 24 hr tablet  Commonly known as:  DITROPAN XL  Take 1 tablet (10 mg total) by mouth daily.     oxyCODONE-acetaminophen 5-325 MG per tablet  Commonly known as:  PERCOCET/ROXICET  Take 2 tablets by mouth every 4 (four) hours as needed for pain.     oxyCODONE-acetaminophen 10-325 MG per tablet  Commonly known as:  PERCOCET  Take 1 tablet by mouth every 8 (eight) hours as needed for pain.     pravastatin 40 MG tablet  Commonly known as:  PRAVACHOL  Take 1 tablet (40 mg total) by mouth every evening.     predniSONE 5 MG tablet  Commonly known as:  DELTASONE  Take 5 mg by mouth daily.     predniSONE 2.5 MG tablet  Commonly known as:  DELTASONE  Take 2.5 mg by mouth daily.     propranolol 10 MG tablet  Commonly  known as:  INDERAL  Take 1 tablet (10 mg total) by mouth 3 (three) times daily.     tiotropium 18 MCG inhalation capsule  Commonly known as:  SPIRIVA HANDIHALER  Place 1 capsule (18 mcg total) into inhaler and inhale daily.         SignedStefani Dama 10/22/2012, 7:45 PM

## 2012-10-22 NOTE — Plan of Care (Signed)
Problem: Consults Goal: Diagnosis - Spinal Surgery Outcome: Completed/Met Date Met:  10/22/12 Microdiscectomy

## 2012-10-22 NOTE — Progress Notes (Signed)
PT Cancellation Note  Patient Details Name: Tonya Sullivan MRN: [id number] DOB: [birth year]   Cancelled Treatment:    Reason Eval/Treat Not Completed: PT screened, no needs identified, will sign off   Tyjah Hai 10/22/2012, 10:25 AM

## 2012-10-22 NOTE — Progress Notes (Signed)
Utilization review completed.  

## 2012-10-23 ENCOUNTER — Encounter (HOSPITAL_COMMUNITY): Payer: Self-pay | Admitting: Neurological Surgery

## 2012-10-23 NOTE — Progress Notes (Signed)
Pt. discharged home accompanied by family. Prescriptions and discharge instructions given with verbalization of understanding. Incision site on neck with no s/s of infection - no swelling, redness, bleeding, and/or drainage noted. Soft collar intact. Pain med given just before leaving. Opportunity given to ask questions but no question asked. Pt. transported out of this unit in wheelchair by the volunteer.

## 2012-10-23 NOTE — Progress Notes (Signed)
OT EVALUATION  Completed all education regarding ADL/cervical precautions. Handout given. Rec pt use showerseat. Given reacher. Will have adequate support for safe D/C home.   10/22/12 1300  OT Visit Information  Last OT Received On 10/22/12  Assistance Needed +1  History of Present Illness s/p ACDF.   Precautions  Precautions Cervical  Home Living  Family/patient expects to be discharged to: Private residence  Living Arrangements Children  Available Help at Discharge Family;Available 24 hours/day  Type of Home Mobile home  Home Access Stairs to enter  Entrance Stairs-Number of Steps 4  Entrance Stairs-Rails Right;Left  Home Layout One level  Bathroom Shower/Tub Walk-in Pension scheme manager Yes  Home Equipment [place] - single point  Prior Function  Level of Independence Independent with assistive device(s)  Comments used straight cane PRN. Had difficulty with bathing due to needing showerchair  Communication  Communication No difficulties  Cognition  Arousal/Alertness Awake/alert  Behavior During Therapy WFL for tasks assessed/performed  Overall Cognitive Status Within Functional Limits for tasks assessed  Upper Extremity Assessment  Upper Extremity Assessment Overall WFL for tasks assessed  Lower Extremity Assessment  Lower Extremity Assessment Overall WFL for tasks assessed  Cervical / Trunk Assessment  Cervical / Trunk Assessment Normal  ADL  Grooming Supervision/safety;Set up  Where Assessed - Grooming Unsupported standing  Upper Body Bathing Supervision/safety;Set up  Where Assessed - Upper Body Bathing Unsupported sitting  Lower Body Bathing Minimal assistance  Where Assessed - Lower Body Bathing Unsupported sit to stand  Upper Body Dressing Supervision/safety;Set up  Where Assessed - Upper Body Dressing Unsupported sitting  Lower Body Dressing Minimal assistance  Where Assessed - Lower Body Dressing Unsupported sit to stand   Toilet Transfer Modified independent  Toilet Transfer Method Other (comment) (ambulating)  Toilet Transfer Equipment Comfort height toilet  Toileting - Clothing Manipulation and Hygiene Modified independent  Where Assessed - Toileting Clothing Manipulation and Hygiene Sit to stand from 3-in-1 or toilet  Equipment Used Reacher;Long-handled sponge  Transfers/Ambulation Related to ADLs mod I  ADL Comments Educated pt on cervical precautions and ADL .Pt had difficulty with bathing prior to surgery and standing for long periods. REc showerset. Given reacher to assist with ADL,.  Bed Mobility  Bed Mobility Supine to Sit;Rolling Right;Right Sidelying to Sit  Rolling Right 6: Modified independent (Device/Increase time)  Right Sidelying to Sit 6: Modified independent (Device/Increase time)  Supine to Sit 6: Modified independent (Device/Increase time)  Transfers  Transfers Sit to Stand;Stand to Sit  Sit to Stand 6: Modified independent (Device/Increase time)  Stand to Sit 6: Modified independent (Device/Increase time)  OT - End of Session  Activity Tolerance Patient tolerated treatment well  Patient left in bed;with call bell/phone within reach  Nurse Communication Mobility status;Other (comment) (D/C plans)  OT Assessment  OT Recommendation/Assessment Patient does not need any further OT services  OT Recommendation  Follow Up Recommendations No OT follow up  OT Equipment Tub/shower seat  OT Time Calculation  OT Start Time 1000  OT Stop Time 1014  OT Time Calculation (min) 14 min  [facility], OTR/L  [phone] 10/22/2012

## 2012-10-25 ENCOUNTER — Inpatient Hospital Stay (HOSPITAL_COMMUNITY)
Admission: AD | Admit: 2012-10-25 | Discharge: 2012-10-27 | DRG: 392 | Disposition: A | Discharge status: Home / Self Care | Payer: Medicare Other | Source: Other Acute Inpatient Hospital | Attending: Internal Medicine | Admitting: Internal Medicine

## 2012-10-25 ENCOUNTER — Encounter (HOSPITAL_COMMUNITY): Payer: Self-pay | Admitting: *Deleted

## 2012-10-25 ENCOUNTER — Inpatient Hospital Stay (HOSPITAL_COMMUNITY): Payer: Medicare Other

## 2012-10-25 DIAGNOSIS — B349 Viral infection, unspecified: Secondary | ICD-10-CM

## 2012-10-25 DIAGNOSIS — R471 Dysarthria and anarthria: Secondary | ICD-10-CM

## 2012-10-25 DIAGNOSIS — F319 Bipolar disorder, unspecified: Secondary | ICD-10-CM

## 2012-10-25 DIAGNOSIS — Z720 Tobacco use: Secondary | ICD-10-CM

## 2012-10-25 DIAGNOSIS — R32 Unspecified urinary incontinence: Secondary | ICD-10-CM

## 2012-10-25 DIAGNOSIS — R490 Dysphonia: Secondary | ICD-10-CM

## 2012-10-25 DIAGNOSIS — R600 Localized edema: Secondary | ICD-10-CM

## 2012-10-25 DIAGNOSIS — M5030 Other cervical disc degeneration, unspecified cervical region: Secondary | ICD-10-CM | POA: Diagnosis present

## 2012-10-25 DIAGNOSIS — R079 Chest pain, unspecified: Secondary | ICD-10-CM

## 2012-10-25 DIAGNOSIS — J4489 Other specified chronic obstructive pulmonary disease: Secondary | ICD-10-CM | POA: Diagnosis present

## 2012-10-25 DIAGNOSIS — M51379 Other intervertebral disc degeneration, lumbosacral region without mention of lumbar back pain or lower extremity pain: Secondary | ICD-10-CM | POA: Diagnosis present

## 2012-10-25 DIAGNOSIS — K0889 Other specified disorders of teeth and supporting structures: Secondary | ICD-10-CM

## 2012-10-25 DIAGNOSIS — L089 Local infection of the skin and subcutaneous tissue, unspecified: Secondary | ICD-10-CM

## 2012-10-25 DIAGNOSIS — I10 Essential (primary) hypertension: Secondary | ICD-10-CM

## 2012-10-25 DIAGNOSIS — W19XXXA Unspecified fall, initial encounter: Secondary | ICD-10-CM

## 2012-10-25 DIAGNOSIS — R109 Unspecified abdominal pain: Secondary | ICD-10-CM

## 2012-10-25 DIAGNOSIS — K219 Gastro-esophageal reflux disease without esophagitis: Secondary | ICD-10-CM | POA: Diagnosis present

## 2012-10-25 DIAGNOSIS — Z79899 Other long term (current) drug therapy: Secondary | ICD-10-CM

## 2012-10-25 DIAGNOSIS — I5189 Other ill-defined heart diseases: Secondary | ICD-10-CM

## 2012-10-25 DIAGNOSIS — K209 Esophagitis, unspecified without bleeding: Principal | ICD-10-CM | POA: Diagnosis present

## 2012-10-25 DIAGNOSIS — K224 Dyskinesia of esophagus: Secondary | ICD-10-CM | POA: Diagnosis present

## 2012-10-25 DIAGNOSIS — IMO0002 Reserved for concepts with insufficient information to code with codable children: Secondary | ICD-10-CM

## 2012-10-25 DIAGNOSIS — I6523 Occlusion and stenosis of bilateral carotid arteries: Secondary | ICD-10-CM

## 2012-10-25 DIAGNOSIS — E2749 Other adrenocortical insufficiency: Secondary | ICD-10-CM | POA: Diagnosis present

## 2012-10-25 DIAGNOSIS — R112 Nausea with vomiting, unspecified: Secondary | ICD-10-CM

## 2012-10-25 DIAGNOSIS — R233 Spontaneous ecchymoses: Secondary | ICD-10-CM

## 2012-10-25 DIAGNOSIS — G459 Transient cerebral ischemic attack, unspecified: Secondary | ICD-10-CM

## 2012-10-25 DIAGNOSIS — J449 Chronic obstructive pulmonary disease, unspecified: Secondary | ICD-10-CM

## 2012-10-25 DIAGNOSIS — R002 Palpitations: Secondary | ICD-10-CM

## 2012-10-25 DIAGNOSIS — F329 Major depressive disorder, single episode, unspecified: Secondary | ICD-10-CM

## 2012-10-25 DIAGNOSIS — K222 Esophageal obstruction: Secondary | ICD-10-CM

## 2012-10-25 DIAGNOSIS — N179 Acute kidney failure, unspecified: Secondary | ICD-10-CM

## 2012-10-25 DIAGNOSIS — R232 Flushing: Secondary | ICD-10-CM

## 2012-10-25 DIAGNOSIS — G8929 Other chronic pain: Secondary | ICD-10-CM

## 2012-10-25 DIAGNOSIS — R609 Edema, unspecified: Secondary | ICD-10-CM

## 2012-10-25 DIAGNOSIS — D229 Melanocytic nevi, unspecified: Secondary | ICD-10-CM

## 2012-10-25 DIAGNOSIS — F17200 Nicotine dependence, unspecified, uncomplicated: Secondary | ICD-10-CM | POA: Diagnosis present

## 2012-10-25 DIAGNOSIS — F32A Depression, unspecified: Secondary | ICD-10-CM

## 2012-10-25 DIAGNOSIS — E785 Hyperlipidemia, unspecified: Secondary | ICD-10-CM

## 2012-10-25 DIAGNOSIS — M5137 Other intervertebral disc degeneration, lumbosacral region: Secondary | ICD-10-CM | POA: Diagnosis present

## 2012-10-25 DIAGNOSIS — E063 Autoimmune thyroiditis: Secondary | ICD-10-CM | POA: Diagnosis present

## 2012-10-25 DIAGNOSIS — R1310 Dysphagia, unspecified: Secondary | ICD-10-CM

## 2012-10-25 LAB — URINALYSIS, ROUTINE W REFLEX MICROSCOPIC
Glucose, UA: NEGATIVE mg/dL
Hgb urine dipstick: NEGATIVE
Leukocytes, UA: NEGATIVE
Nitrite: NEGATIVE
Protein, ur: NEGATIVE mg/dL
Specific Gravity, Urine: 1.012 (ref 1.005–1.030)
Urobilinogen, UA: 0.2 mg/dL (ref 0.0–1.0)

## 2012-10-25 LAB — PRO B NATRIURETIC PEPTIDE: Pro B Natriuretic peptide (BNP): 209 pg/mL — ABNORMAL HIGH (ref 0–125)

## 2012-10-25 LAB — COMPREHENSIVE METABOLIC PANEL
AST: 22 U/L (ref 0–37)
Albumin: 3.3 g/dL — ABNORMAL LOW (ref 3.5–5.2)
BUN: 11 mg/dL (ref 6–23)
Calcium: 8.6 mg/dL (ref 8.4–10.5)
Creatinine, Ser: 0.89 mg/dL (ref 0.50–1.10)
GFR calc non Af Amer: 71 mL/min — ABNORMAL LOW (ref >90)

## 2012-10-25 LAB — CBC WITH DIFFERENTIAL/PLATELET
Basophils Absolute: 0 10*3/uL (ref 0.0–0.1)
Basophils Relative: 0 % (ref 0–1)
Eosinophils Relative: 2 % (ref 0–5)
HCT: 38.3 % (ref 36.0–46.0)
Hemoglobin: 13.2 g/dL (ref 12.0–15.0)
MCH: 32.5 pg (ref 26.0–34.0)
MCHC: 34.5 g/dL (ref 30.0–36.0)
MCV: 94.3 fL (ref 78.0–100.0)
Monocytes Absolute: 0.3 10*3/uL (ref 0.1–1.0)
Monocytes Relative: 6 % (ref 3–12)
Neutro Abs: 3.8 10*3/uL (ref 1.7–7.7)
Platelets: 228 10*3/uL (ref 150–400)
RDW: 13.3 % (ref 11.5–15.5)

## 2012-10-25 LAB — MAGNESIUM: Magnesium: 1.7 mg/dL (ref 1.5–2.5)

## 2012-10-25 MED ORDER — OXYBUTYNIN CHLORIDE ER 10 MG PO TB24
10.0000 mg | ORAL_TABLET | Freq: Every day | ORAL | Status: DC
  Administered 2012-10-25 – 2012-10-27 (×3): 10 mg via ORAL
  Filled 2012-10-25 (×3): qty 1

## 2012-10-25 MED ORDER — ALBUTEROL SULFATE (5 MG/ML) 0.5% IN NEBU
2.5000 mg | INHALATION_SOLUTION | Freq: Four times a day (QID) | RESPIRATORY_TRACT | Status: DC
  Administered 2012-10-25 (×3): 2.5 mg via RESPIRATORY_TRACT
  Filled 2012-10-25 (×2): qty 0.5

## 2012-10-25 MED ORDER — AMITRIPTYLINE HCL 100 MG PO TABS
100.0000 mg | ORAL_TABLET | Freq: Every day | ORAL | Status: DC
  Administered 2012-10-25 – 2012-10-26 (×2): 100 mg via ORAL
  Filled 2012-10-25 (×3): qty 1

## 2012-10-25 MED ORDER — ONDANSETRON HCL 4 MG PO TABS: 4.0000 mg | ORAL_TABLET | Freq: Four times a day (QID) | ORAL | Status: DC | PRN

## 2012-10-25 MED ORDER — IPRATROPIUM BROMIDE 0.02 % IN SOLN
0.5000 mg | Freq: Two times a day (BID) | RESPIRATORY_TRACT | Status: DC
  Administered 2012-10-26 – 2012-10-27 (×3): 0.5 mg via RESPIRATORY_TRACT
  Filled 2012-10-25 (×3): qty 2.5

## 2012-10-25 MED ORDER — OXYCODONE-ACETAMINOPHEN 5-325 MG PO TABS
1.0000 | ORAL_TABLET | Freq: Four times a day (QID) | ORAL | Status: DC | PRN
  Administered 2012-10-25 – 2012-10-27 (×6): 2 via ORAL
  Filled 2012-10-25 (×6): qty 2

## 2012-10-25 MED ORDER — CLONAZEPAM 1 MG PO TABS
1.0000 mg | ORAL_TABLET | Freq: Two times a day (BID) | ORAL | Status: DC
  Administered 2012-10-25 – 2012-10-27 (×5): 1 mg via ORAL
  Filled 2012-10-25 (×5): qty 1

## 2012-10-25 MED ORDER — LOSARTAN POTASSIUM 50 MG PO TABS
50.0000 mg | ORAL_TABLET | Freq: Every day | ORAL | Status: DC
  Administered 2012-10-25 – 2012-10-27 (×3): 50 mg via ORAL
  Filled 2012-10-25 (×3): qty 1

## 2012-10-25 MED ORDER — GUAIFENESIN-DM 100-10 MG/5ML PO SYRP
5.0000 mL | ORAL_SOLUTION | ORAL | Status: DC | PRN
  Administered 2012-10-25 – 2012-10-26 (×2): 5 mL via ORAL
  Filled 2012-10-25 (×2): qty 5

## 2012-10-25 MED ORDER — ALBUTEROL SULFATE (5 MG/ML) 0.5% IN NEBU
2.5000 mg | INHALATION_SOLUTION | RESPIRATORY_TRACT | Status: DC | PRN
  Administered 2012-10-26: 18:00:00 2.5 mg via RESPIRATORY_TRACT
  Filled 2012-10-25 (×2): qty 0.5

## 2012-10-25 MED ORDER — MORPHINE SULFATE 2 MG/ML IJ SOLN
1.0000 mg | INTRAMUSCULAR | Status: DC | PRN
  Administered 2012-10-26: 1 mg via INTRAVENOUS
  Filled 2012-10-25: qty 1

## 2012-10-25 MED ORDER — ACETAMINOPHEN 650 MG RE SUPP: 650.0000 mg | Freq: Four times a day (QID) | RECTAL | Status: DC | PRN

## 2012-10-25 MED ORDER — PROPRANOLOL HCL 10 MG PO TABS
10.0000 mg | ORAL_TABLET | Freq: Three times a day (TID) | ORAL | Status: DC
  Administered 2012-10-25 – 2012-10-26 (×4): 10 mg via ORAL
  Filled 2012-10-25 (×6): qty 1

## 2012-10-25 MED ORDER — NAPROXEN 500 MG PO TABS
500.0000 mg | ORAL_TABLET | Freq: Two times a day (BID) | ORAL | Status: DC
  Administered 2012-10-25 – 2012-10-27 (×5): 500 mg via ORAL
  Filled 2012-10-25 (×6): qty 1

## 2012-10-25 MED ORDER — BACLOFEN 10 MG PO TABS
10.0000 mg | ORAL_TABLET | Freq: Two times a day (BID) | ORAL | Status: DC
  Administered 2012-10-25 – 2012-10-27 (×5): 10 mg via ORAL
  Filled 2012-10-25 (×6): qty 1

## 2012-10-25 MED ORDER — DEXAMETHASONE SODIUM PHOSPHATE 4 MG/ML IJ SOLN
4.0000 mg | Freq: Three times a day (TID) | INTRAMUSCULAR | Status: DC
  Administered 2012-10-25 – 2012-10-26 (×6): 4 mg via INTRAVENOUS
  Filled 2012-10-25 (×9): qty 1

## 2012-10-25 MED ORDER — SODIUM CHLORIDE 0.9 % IV SOLN
INTRAVENOUS | Status: DC
  Administered 2012-10-25 – 2012-10-26 (×2): via INTRAVENOUS

## 2012-10-25 MED ORDER — LIDOCAINE 5 % EX PTCH
1.0000 | MEDICATED_PATCH | Freq: Two times a day (BID) | CUTANEOUS | Status: DC
  Administered 2012-10-25 – 2012-10-27 (×3): 1 via TRANSDERMAL
  Filled 2012-10-25 (×7): qty 1

## 2012-10-25 MED ORDER — METHOCARBAMOL 500 MG PO TABS
500.0000 mg | ORAL_TABLET | Freq: Four times a day (QID) | ORAL | Status: DC | PRN
  Administered 2012-10-26: 22:00:00 500 mg via ORAL
  Filled 2012-10-25 (×3): qty 1

## 2012-10-25 MED ORDER — DULOXETINE HCL 30 MG PO CPEP
30.0000 mg | ORAL_CAPSULE | Freq: Every day | ORAL | Status: DC
  Administered 2012-10-25 – 2012-10-27 (×3): 30 mg via ORAL
  Filled 2012-10-25 (×3): qty 1

## 2012-10-25 MED ORDER — ENOXAPARIN SODIUM 40 MG/0.4ML ~~LOC~~ SOLN
40.0000 mg | SUBCUTANEOUS | Status: DC
  Administered 2012-10-25 – 2012-10-27 (×3): 40 mg via SUBCUTANEOUS
  Filled 2012-10-25 (×4): qty 0.4

## 2012-10-25 MED ORDER — POTASSIUM CHLORIDE CRYS ER 20 MEQ PO TBCR
40.0000 meq | EXTENDED_RELEASE_TABLET | Freq: Once | ORAL | Status: AC
  Administered 2012-10-25: 40 meq via ORAL
  Filled 2012-10-25: qty 2

## 2012-10-25 MED ORDER — ONDANSETRON HCL 4 MG/2ML IJ SOLN: 4.0000 mg | Freq: Four times a day (QID) | INTRAMUSCULAR | Status: DC | PRN

## 2012-10-25 MED ORDER — HYDROCHLOROTHIAZIDE 25 MG PO TABS
25.0000 mg | ORAL_TABLET | Freq: Every day | ORAL | Status: DC
Start: 2012-10-25 — End: 2012-10-27
  Administered 2012-10-25 – 2012-10-27 (×3): 25 mg via ORAL
  Filled 2012-10-25 (×3): qty 1

## 2012-10-25 MED ORDER — IPRATROPIUM BROMIDE 0.02 % IN SOLN
0.5000 mg | Freq: Four times a day (QID) | RESPIRATORY_TRACT | Status: DC
  Administered 2012-10-25 (×3): 0.5 mg via RESPIRATORY_TRACT
  Filled 2012-10-25 (×3): qty 2.5

## 2012-10-25 MED ORDER — PANTOPRAZOLE SODIUM 40 MG IV SOLR
40.0000 mg | Freq: Two times a day (BID) | INTRAVENOUS | Status: DC
  Administered 2012-10-25 – 2012-10-26 (×4): 40 mg via INTRAVENOUS
  Filled 2012-10-25 (×6): qty 40

## 2012-10-25 MED ORDER — ACETAMINOPHEN 325 MG PO TABS: 650.0000 mg | ORAL_TABLET | Freq: Four times a day (QID) | ORAL | Status: DC | PRN

## 2012-10-25 MED ORDER — ALBUTEROL SULFATE (5 MG/ML) 0.5% IN NEBU
2.5000 mg | INHALATION_SOLUTION | Freq: Two times a day (BID) | RESPIRATORY_TRACT | Status: DC
  Administered 2012-10-26 – 2012-10-27 (×3): 2.5 mg via RESPIRATORY_TRACT
  Filled 2012-10-25 (×3): qty 0.5

## 2012-10-25 MED ORDER — SIMVASTATIN 20 MG PO TABS
20.0000 mg | ORAL_TABLET | Freq: Every day | ORAL | Status: DC
  Administered 2012-10-25 – 2012-10-26 (×2): 20 mg via ORAL
  Filled 2012-10-25 (×3): qty 1

## 2012-10-25 NOTE — H&P (Addendum)
PATIENT DETAILS Name: Tonya Sullivan Age: 57 y.o. Sex: female Date of Birth: 10-18-1955 Admit Date: 10/25/2012 [id number], Kingsley Spittle, MD  CHIEF COMPLAINT:  Vomiting and difficulty swallowing for the past 2 days  HPI: Tonya Sullivan is a 57 y.o. 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 Apparently on discharge, patient was doing well, unfortunately upon going home she started having "gagging", and had worsening difficulty of the situation. She claims that all liquids she was eating she was doing it right back up. She then presented [facility], and then was transferred to [facility] further evaluation and treatment. Patient is not the best historian, she is not very clear whether the dysphagia is to solids and liquids, however it sounds like, it is with both and she is somewhat able to tolerate liquids better. She claims it had episodes of dysphagia in the past, but never "this bad". Apart from pain at the surgical site, she denies any other pain. She denies any abdominal pain, she denies any diarrhea. Denies any shortness of breath or chest pain. She is now being admitted for further evaluation and treatment.   ALLERGIES:   Allergies  Allergen Reactions  . Codeine Itching  . Cyclobenzaprine Hives  . Darvocet [Propoxyphene-Acetaminophen] Rash  . Abilify [Aripiprazole] Other (See Comments)    tremors  . Ace Inhibitors   . Adhesive [Tape] Hives  . Iodine Hives  . Levofloxacin Hives and Hypertension  . Sulfa Antibiotics Itching    PAST MEDICAL HISTORY: Past Medical History  Diagnosis Date  . COPD (chronic obstructive pulmonary disease)   . Depression   . Chronic back pain     DDD, disc bulge, radiculopathy, spinal stenosis  .  Hyperlipidemia   . Anxiety   . Bipolar disorder, unspecified   . Carotid artery calcification   . DDD (degenerative disc disease), lumbosacral   . Spinal stenosis, lumbar   . Melanoma     removed 1 week ago at Dr Laurena Slimmer office  . Thyroid disease   . Autoimmune thyroiditis   . Hypertension   . Dysrhythmia     palpitations  . TIA (transient ischemic attack)     august 2014  . Pneumonia     hx of   . GERD (gastroesophageal reflux disease)   . H/O dizziness   . Neuropathic pain   . Adrenal gland dysfunction     Addison's disease    PAST SURGICAL HISTORY: Past Surgical History  Procedure Laterality Date  . Appendectomy    . Tonsillectomy    . Cholecystectomy    . Lump left breast      removed- benign  . Abdominal hysterectomy      tubal pregnancy  . Inguinal hernia repair    . Stomach surgery      ?holes in esophgous  . Ectopic pregnancy surgery    . Back surgery      x 5;1984;1989;;1999;2000;2010  . Colonoscopy  01/25/2012    Procedure: COLONOSCOPY;  Surgeon: Malissa Hippo, MD;  Location: [facility];  Service: Endoscopy;  Laterality: N/A;  130  . Esophagogastroduodenoscopy (egd) with esophageal dilation N/A 08/13/2012    Procedure: ESOPHAGOGASTRODUODENOSCOPY (EGD) WITH ESOPHAGEAL DILATION;  Surgeon: Malissa Hippo, MD;  Location: [facility] END[facility] SU[facility];  Service: Endoscopy;  Laterality: N/A;  315  .  Breast surgery      left breast  . Anterior cervical decomp/discectomy fusion N/A 10/21/2012    Procedure: Cervical Six-Seven Anterior cervical decompression/diskectomy/fusion;  Surgeon: Barnett Abu, MD;  Location: [facility] NEURO OR[facility];  Service: Neurosurgery;  Laterality: N/A;  Cervical Six-Seven Anterior cervical decompression/diskectomy/fusion    MEDICATIONS AT HOME: Prior to Admission medications   Medication Sig Start Date End Date Taking? Authorizing Provider  albuterol (PROVENTIL HFA;VENTOLIN HFA) 108 (90 BASE) MCG/ACT inhaler Inhale 2 puffs into the lungs 3 (three) times daily.  10/21/12  Yes Salley Scarlet, MD  albuterol (PROVENTIL) (2.5 MG/3ML) 0.083% nebulizer solution Take 3 mLs (2.5 mg total) by nebulization every 4 (four) hours as needed for wheezing. 05/01/12  Yes Salley Scarlet, MD  amitriptyline (ELAVIL) 50 MG tablet Take 2 tablets (100 mg total) by mouth at bedtime. 10/20/12  Yes Diannia Ruder, MD  baclofen (LIORESAL) 10 MG tablet Take 10 mg by mouth 2 (two) times daily. 07/25/12  Yes Historical Provider, MD  calcium carbonate (OS-CAL) 1250 MG chewable tablet Chew 1 tablet by mouth daily as needed for heartburn.   Yes Historical Provider, MD  clonazePAM (KLONOPIN) 1 MG tablet Take 1 tablet (1 mg total) by mouth 2 (two) times daily. 10/20/12 10/20/13 Yes Diannia Ruder, MD  hydrochlorothiazide (HYDRODIURIL) 25 MG tablet Take 1 tablet (25 mg total) by mouth daily. For blood pressure and swelling 08/12/12  Yes Salley Scarlet, MD  lidocaine (LIDODERM) 5 % Place 1 patch onto the skin every 12 (twelve) hours. Remove & Discard patch within 12 hours or as directed by MD 05/22/12 05/22/13 Yes Mike Craze, MD  losartan (COZAAR) 50 MG tablet Take 1 tablet (50 mg total) by mouth daily. 05/13/12  Yes Gaylord Shih, MD  methocarbamol (ROBAXIN) 500 MG tablet Take 1 tablet (500 mg total) by mouth every 6 (six) hours as needed. 10/22/12  Yes Barnett Abu, MD  naproxen (NAPROSYN) 500 MG tablet Take 500 mg by mouth 2 (two) times daily. 08/06/12  Yes Historical Provider, MD  oxybutynin (DITROPAN XL) 10 MG 24 hr tablet Take 1 tablet (10 mg total) by mouth daily. 10/15/12  Yes Salley Scarlet, MD  oxyCODONE-acetaminophen (PERCOCET) 10-325 MG per tablet Take 1 tablet by mouth every 8 (eight) hours as needed for pain.   Yes Historical Provider, MD  pravastatin (PRAVACHOL) 40 MG tablet Take 1 tablet (40 mg total) by mouth every evening. 08/25/12  Yes Salley Scarlet, MD  predniSONE (DELTASONE) 2.5 MG tablet Take 2.5 mg by mouth daily.   Yes Historical Provider, MD  predniSONE (DELTASONE) 5 MG  tablet Take 5 mg by mouth daily.   Yes Historical Provider, MD  propranolol (INDERAL) 10 MG tablet Take 1 tablet (10 mg total) by mouth 3 (three) times daily. 10/20/12  Yes Diannia Ruder, MD  tiotropium (SPIRIVA HANDIHALER) 18 MCG inhalation capsule Place 1 capsule (18 mcg total) into inhaler and inhale daily. 01/01/12  Yes Salley Scarlet, MD    FAMILY HISTORY: Family History  Problem Relation Age of Onset  . Hypertension Mother   . Heart disease Mother   . Heart attack Mother   . Anxiety disorder Mother   . Cancer Father     lung cancer  . Heart attack Father   . Alcohol abuse Father   . Hypertension Brother   . Alcohol abuse Brother   . Bipolar disorder Brother   . ADD / ADHD Brother   . Drug abuse Brother   .  OCD Brother   . Alcohol abuse Sister   . Bipolar disorder Sister   . Anxiety disorder Sister   . ADD / ADHD Sister   . Drug abuse Sister   . Physical abuse Sister   . Sexual abuse Sister   . Alcohol abuse Brother   . Bipolar disorder Brother   . ADD / ADHD Brother   . Drug abuse Brother   . Alcohol abuse Sister   . Bipolar disorder Sister   . Anxiety disorder Sister   . ADD / ADHD Sister   . Physical abuse Sister   . Sexual abuse Sister   . Alcohol abuse Brother   . Bipolar disorder Brother   . ADD / ADHD Brother   . Bipolar disorder Brother   . ADD / ADHD Brother   . Alcohol abuse Brother   . Paranoid behavior Brother   . Physical abuse Brother   . Sexual abuse Brother   . Bipolar disorder Maternal Aunt   . Bipolar disorder Paternal Aunt   . Bipolar disorder Maternal Uncle   . Bipolar disorder Paternal Uncle   . Bipolar disorder Maternal Grandfather   . Alcohol abuse Maternal Grandfather   . Bipolar disorder Maternal Grandmother   . Alcohol abuse Maternal Grandmother   . Anxiety disorder Maternal Grandmother   . Dementia Maternal Grandmother   . Bipolar disorder Paternal Grandfather   . Alcohol abuse Paternal Grandfather   . Bipolar disorder  Paternal Grandmother   . Alcohol abuse Paternal Grandmother   . Drug abuse Paternal Grandmother   . Bipolar disorder Maternal Uncle   . Schizophrenia Neg Hx   . Seizures Neg Hx     SOCIAL HISTORY:  reports that she has been smoking Cigarettes.  She has a 20 pack-year smoking history. She has never used smokeless tobacco. She reports that she does not drink alcohol or use illicit drugs.  REVIEW OF SYSTEMS:  Constitutional:   No  weight loss, night sweats,  Fevers, chills, fatigue.  HEENT:    No headaches, Tooth/dental problems,Sore throat,  No sneezing, itching, ear ache, nasal congestion, post nasal drip,   Cardio-vascular: No chest pain,  Orthopnea, PND, swelling in lower extremities, anasarca,  dizziness, palpitations  GI:  No heartburn, indigestion, abdominal pain,  diarrhea, change in  bowel habits, loss of appetite  Resp: No shortness of breath with exertion or at rest.  No excess mucus, no productive cough, No non-productive cough,  No coughing up of blood.No change in color of mucus.No wheezing.No chest wall deformity  Skin:  no rash or lesions.  GU:  no dysuria, change in color of urine, no urgency or frequency.  No flank pain.  Musculoskeletal: No joint pain or swelling.  No decreased range of motion.  No back pain.  Psych: No change in mood or affect. No depression or anxiety.  No memory loss.   PHYSICAL EXAM: Blood pressure 104/73, pulse 108, temperature 100.8 F (38.2 C), temperature source Oral, resp. rate 18, height 5\' 7"  (1.702 m), weight 92.534 kg (204 lb), SpO2 95.00%.  General appearance :Awake, alert, not in any distress. Speech Clear. Not toxic Looking HEENT: Atraumatic and Normocephalic, pupils equally reactive to light and accomodation Neck: supple, no JVD. No cervical lymphadenopathy.  Chest:Good air entry bilaterally, few scattered rhonchi.  CVS: S1 S2 regular, no murmurs.  Abdomen: Bowel sounds present, Non tender and not distended with no  gaurding, rigidity or rebound. Extremities: B/L Lower Ext shows no edema, both legs  are warm to touch Neurology: Awake alert, and oriented X 3, CN II-XII intact, Non focal Skin:No Rash Wounds:N/A  LABS ON ADMISSION:  No results found for this basename: NA, K, CL, CO2, GLUCOSE, BUN, CREATININE, CALCIUM, MG, PHOS,  in the last 72 hours No results found for this basename: AST, ALT, ALKPHOS, BILITOT, PROT, ALBUMIN,  in the last 72 hours No results found for this basename: LIPASE, AMYLASE,  in the last 72 hours No results found for this basename: WBC, NEUTROABS, HGB, HCT, MCV, PLT,  in the last 72 hours No results found for this basename: CKTOTAL, CKMB, CKMBINDEX, TROPONINI,  in the last 72 hours No results found for this basename: DDIMER,  in the last 72 hours No components found with this basename: POCBNP,    RADIOLOGIC STUDIES ON ADMISSION: No results found.  ASSESSMENT AND PLAN: Dysphagia - Not sure at this time whether this is a complication from her recent anterior cervical decompression arthrodesis or a flare of her underlying motility disorder. She had a CT [facility] of [facility] at [facility], these images was discussed by me with Dr.McCollough- radiology-some air in the soft tissue on the left at the operative site seen, mild swelling in the esophagus at the operative level also seen. But no other significant changes. I have spoken with neurosurgery on call, Dr. Rolanda Jay- who has suggested that we place her on steroids to see if we can reduce her esophagitis inflammation. He will also evaluate and provide [place] further recommendations. Since he seems to be tolerating some liquids, I will try her on clear liquids and see how she does. We will also get a speech therapy evaluation. Depending on her clinical course, we will decide whether or not she needs further studies including a barium esophagogram and further GI consultation. Will also start her on a PPI given history of  gastroesophageal reflux disease  Vomiting - Suspect secondary to dysphagia- suspect this is more for regurgitation. - Monitor, supportive care and follow clinical course  History of COPD - Good air entry on exam, some scattered rhonchi. - Will start scheduled nebulized bronchodilators - Already on steroids as noted above - Incentive spirometry  History of hypertension - Will continue with losartan, hydrochlorothiazide, propranolol - Follow BP trend and adjust medications accordingly  Gastroesophageal reflux disease - Start PPI - History of Nissen's fundoplication 15 years back  History of cervical degenerative disc disease - Status post recent anterior cervical decompression arthrodesis - Please see above, neurosurgical reconsulted - PT eval  History of bipolar disorder - Currently stable, continue with preadmission medications  Further plan will depend as patient's clinical course evolves and further radiologic and laboratory data become available. Patient will be monitored closely.   DVT Prophylaxis: Prophylactic Lovenox   Code Status: Full Code  Total time spent for admission equals 45 minutes.  [facility] Pager [phone]  If 7PM-7AM, please contact night-coverage [email or web address] Password [facility] 10/25/2012, 10:23 AM

## 2012-10-25 NOTE — Progress Notes (Signed)
Tonya Sullivan [id number] Admitted to 5w27: 10/25/2012 6:35 AM Attending Idelia Caudell: Haydee Monica, MD    Tonya Sullivan is a 57 y.o. female patient admitted from ED awake, alert  & orientated  X 3,  Prior, VSS - There were no vitals taken for this visit., O2   2LPML nasal cannular, shortness of breath,  c/o chest pain, no distress noted.    IV site WDL:  antecubital right, condition patent and no redness with a transparent dsg that's clean dry and intact.  Allergies:   Allergies  Allergen Reactions  . Codeine Itching  . Cyclobenzaprine Hives  . Darvocet [Propoxyphene-Acetaminophen] Rash  . Abilify [Aripiprazole] Other (See Comments)    tremors  . Ace Inhibitors   . Adhesive [Tape] Hives  . Iodine Hives  . Levofloxacin Hives and Hypertension  . Sulfa Antibiotics Itching     Past Medical History  Diagnosis Date  . COPD (chronic obstructive pulmonary disease)   . Depression   . Chronic back pain     DDD, disc bulge, radiculopathy, spinal stenosis  . Hyperlipidemia   . Anxiety   . Bipolar disorder, unspecified   . Carotid artery calcification   . DDD (degenerative disc disease), lumbosacral   . Spinal stenosis, lumbar   . Melanoma     removed 1 week ago at Dr Laurena Slimmer office  . Thyroid disease   . Autoimmune thyroiditis   . Hypertension   . Dysrhythmia     palpitations  . TIA (transient ischemic attack)     august 2014  . Pneumonia     hx of   . GERD (gastroesophageal reflux disease)   . H/O dizziness   . Neuropathic pain   . Adrenal gland dysfunction     Addison's disease    History:  obtained from the patient.  Pt orientation to unit, room and routine. Information packet given to patient/family and safety video watched.  Admission INP armband ID verified with patient/family, and in place. SR up x 2, fall risk assessment complete with Patient and family verbalizing understanding of risks associated with falls. Pt verbalizes an understanding of how to use the call  bell and to call for help before getting out of bed.  Skin, clean-dry- intact without evidence of bruising, or skin tears.   No evidence of skin break down noted on exam.    Will cont to monitor and assist as needed.  Tessie Eke, RN 10/25/2012 6:35 AM

## 2012-10-25 NOTE — Progress Notes (Signed)
PASSWORD: GRACE  PLEASE RELEASE INFO TO FAMILY THAT CALL OR ASK ABOUT PATIENT STATUS WITH PASSWORD.

## 2012-10-25 NOTE — Progress Notes (Signed)
Pt seen and examined. She reports "gagging" on some liquids and solid foods over the last 2 days since d/c 3 days ago from C6-7 ACDF. She was able to tolerate some jello and cranberry juice this morning without difficulty, but says she couldn't swallow broth. She denies significant neck or arm pain.  EXAM:  BP 104/73  Pulse 108  Temp(Src) 100.8 F (38.2 C) (Oral)  Resp 18  Ht 5\' 7"  (1.702 m)  Wt 92.534 kg (204 lb)  BMI 31.94 kg/m2  SpO2 95%  Awake, alert, oriented  Speech fluent, appropriate  CN grossly intact  5/5 BUE/BLE  Neck supple without hematoma  IMAGING: CT neck at outside hospital reportedly demonstrates mild prevertebral and esophageal edema. No other significant findings.  IMPRESSION:  57 y.o. female with postoperative dysphagia with possible underlying esophageal dysmotility problem, able to tolerate some liquids  PLAN: - Cont short course of decadron for esophageal edema - If still appears to be aspirating/gagging may consider swallow study

## 2012-10-25 NOTE — Progress Notes (Signed)
Paged [facility]. Orders to be deferred to daytime physician.

## 2012-10-25 NOTE — Progress Notes (Signed)
Advanced directive copy received

## 2012-10-26 DIAGNOSIS — F319 Bipolar disorder, unspecified: Secondary | ICD-10-CM

## 2012-10-26 DIAGNOSIS — R471 Dysarthria and anarthria: Secondary | ICD-10-CM

## 2012-10-26 LAB — COMPREHENSIVE METABOLIC PANEL
ALT: 17 U/L (ref 0–35)
AST: 16 U/L (ref 0–37)
Albumin: 3.1 g/dL — ABNORMAL LOW (ref 3.5–5.2)
Alkaline Phosphatase: 48 U/L (ref 39–117)
BUN: 14 mg/dL (ref 6–23)
CO2: 31 mEq/L (ref 19–32)
Chloride: 92 mEq/L — ABNORMAL LOW (ref 96–112)
Creatinine, Ser: 0.79 mg/dL (ref 0.50–1.10)
GFR calc non Af Amer: >90 mL/min (ref >90)
Potassium: 4.2 mEq/L (ref 3.5–5.1)
Sodium: 131 mEq/L — ABNORMAL LOW (ref 135–145)
Total Bilirubin: 0.2 mg/dL — ABNORMAL LOW (ref 0.3–1.2)
Total Protein: 6.7 g/dL (ref 6.0–8.3)

## 2012-10-26 LAB — CBC
HCT: 37.7 % (ref 36.0–46.0)
MCV: 94.5 fL (ref 78.0–100.0)
Platelets: 279 10*3/uL (ref 150–400)
RBC: 3.99 MIL/uL (ref 3.87–5.11)
RDW: 13.2 % (ref 11.5–15.5)
WBC: 12.2 10*3/uL — ABNORMAL HIGH (ref 4.0–10.5)

## 2012-10-26 MED ORDER — ALPRAZOLAM 0.25 MG PO TABS
0.2500 mg | ORAL_TABLET | Freq: Three times a day (TID) | ORAL | Status: DC | PRN
  Administered 2012-10-26 (×3): 0.25 mg via ORAL
  Filled 2012-10-26 (×3): qty 1

## 2012-10-26 MED ORDER — PROPRANOLOL HCL 10 MG PO TABS
10.0000 mg | ORAL_TABLET | Freq: Three times a day (TID) | ORAL | Status: DC
  Administered 2012-10-26 – 2012-10-27 (×2): 10 mg via ORAL
  Filled 2012-10-26 (×5): qty 1

## 2012-10-26 MED ORDER — PHENOL 1.4 % MT LIQD
1.0000 | OROMUCOSAL | Status: DC | PRN
  Filled 2012-10-26: qty 177

## 2012-10-26 MED ORDER — ENSURE PUDDING PO PUDG
1.0000 | ORAL | Status: DC
  Administered 2012-10-26: 14:00:00 1 via ORAL

## 2012-10-26 MED ORDER — MENTHOL 3 MG MT LOZG
1.0000 | LOZENGE | OROMUCOSAL | Status: DC | PRN
  Administered 2012-10-26 (×2): 3 mg via ORAL
  Filled 2012-10-26 (×2): qty 9

## 2012-10-26 MED ORDER — ALUM & MAG HYDROXIDE-SIMETH 200-200-20 MG/5ML PO SUSP: 30.0000 mL | ORAL | Status: DC | PRN

## 2012-10-26 MED ORDER — NICOTINE 14 MG/24HR TD PT24
14.0000 mg | MEDICATED_PATCH | Freq: Every day | TRANSDERMAL | Status: DC
  Administered 2012-10-26 – 2012-10-27 (×2): 14 mg via TRANSDERMAL
  Filled 2012-10-26 (×2): qty 1

## 2012-10-26 MED ORDER — GI COCKTAIL ~~LOC~~: 30.0000 mL | Freq: Once | ORAL | Status: DC

## 2012-10-26 MED ORDER — POTASSIUM CHLORIDE CRYS ER 20 MEQ PO TBCR
20.0000 meq | EXTENDED_RELEASE_TABLET | Freq: Every day | ORAL | Status: DC
  Administered 2012-10-26 – 2012-10-27 (×2): 20 meq via ORAL
  Filled 2012-10-26 (×2): qty 1

## 2012-10-26 MED ORDER — ASPIRIN EC 325 MG PO TBEC
325.0000 mg | DELAYED_RELEASE_TABLET | ORAL | Status: AC
  Administered 2012-10-26: 325 mg via ORAL
  Filled 2012-10-26: qty 1

## 2012-10-26 NOTE — Evaluation (Signed)
Physical Therapy Evaluation Patient Details Name: Tonya Sullivan MRN: [id number] DOB: 02/05/1955 Today's Date: 10/26/2012 Time: 4098-1191 PT Time Calculation (min): 19 min  PT Assessment / Plan / Recommendation History of Present Illness  Patient is a 57 yo female s/p ACDF on 10/21 and d/c home on 10/23.  Patient now readmitted due to difficulty swallowing.  Clinical Impression  Patient at mod I level with mobility and gait.  Safe with ambulation with assistive device.  No acute PT needs identified - PT will sign off.  Encouraged ambulation in hallway with nursing.    PT Assessment  Patent does not need any further PT services    Follow Up Recommendations  No PT follow up;Supervision - Intermittent    Does the patient have the potential to tolerate intense rehabilitation      Barriers to Discharge        Equipment Recommendations  None recommended by PT    Recommendations for Other Services     Frequency      Precautions / Restrictions Precautions Precautions: Cervical Restrictions Weight Bearing Restrictions: No   Pertinent Vitals/Pain       Mobility  Bed Mobility Bed Mobility: Not assessed Transfers Transfers: Sit to Stand;Stand to Sit Sit to Stand: 6: Modified independent (Device/Increase time);With upper extremity assist;With armrests;From chair/3-in-1 Stand to Sit: 6: Modified independent (Device/Increase time);With upper extremity assist;With armrests;To chair/3-in-1 Details for Transfer Assistance: No cues needed.  Used proper technique with RW. Ambulation/Gait Ambulation/Gait Assistance: 6: Modified independent (Device/Increase time) Ambulation Distance (Feet): 184 Feet Assistive device: Rolling walker Ambulation/Gait Assistance Details: Patient with good gait pattern.  Balance good with assistive device. Gait Pattern: Within Functional Limits        PT Goals(Current goals can be found in the care plan section)  N/a  Visit Information  Last PT  Received On: 10/26/12 Assistance Needed: +1 History of Present Illness: Patient is a 57 yo female s/p ACDF on 10/21 and d/c home on 10/23.  Patient now readmitted due to difficulty swallowing.       Prior Functioning  Home Living Family/patient expects to be discharged to:: Private residence Living Arrangements: Children Available Help at Discharge: Family;Personal care attendant;Available PRN/intermittently (Son works; Aide 2.5 hours/day) Type of Home: Mobile home Home Access: Stairs to enter Entrance Stairs-Number of Steps: 10 (Patient reports 10 today.  Reported 4 on last admission) Entrance Stairs-Rails: Right;Left Home Layout: One level Home Equipment: Cane - single point;Shower seat (handicapped toilet) Prior Function Level of Independence: Independent with assistive device(s);Needs assistance ADL's / Homemaking Assistance Needed: Aide assists with bathing, meals, laundry, etc. Communication / Swallowing Assistance Needed: Swallowing issues.  See SLP evaluation. Communication Communication: No difficulties    Cognition  Cognition Arousal/Alertness: Awake/alert Behavior During Therapy: Restless Overall Cognitive Status: Within Functional Limits for tasks assessed    Extremity/Trunk Assessment Upper Extremity Assessment Upper Extremity Assessment: Overall WFL for tasks assessed Lower Extremity Assessment Lower Extremity Assessment: Overall WFL for tasks assessed Cervical / Trunk Assessment Cervical / Trunk Assessment: Other exceptions Cervical / Trunk Exceptions: Cervical limitations due to recent surgery   Balance    End of Session PT - End of Session Equipment Utilized During Treatment: Gait belt Activity Tolerance: Patient tolerated treatment well Patient left: in chair;with call bell/phone within reach Nurse Communication: Mobility status  GP     Vena Austria 10/26/2012, 1:08 PM Durenda Hurt. Renaldo Fiddler, [facility] Acute Rehab Services Pager [phone]

## 2012-10-26 NOTE — Progress Notes (Addendum)
PATIENT DETAILS Name: Tonya Sullivan Age: 57 y.o. Sex: female Date of Birth: 09/07/1955 Admit Date: 10/25/2012 Admitting Physician Haydee Monica, MD ZOX:[id number], Kingsley Spittle, MD  Subjective: Claims to have significant improvement in dysphagia today. Wants to go home.  Assessment/Plan: Principal Problem:   Dysphagia - Suspect this is related to esophageal inflammation following anterior cervical decompression arthrodesis, as this occurred in the postoperative course. She also has a history of underlying primary esophageal motility disorder. In any event, she was admitted and started on IV Decadron, IV PPI, clear liquid diet and given other supportive care. -Since better, will advance to a full liquid diet and see how she does. If she continues to improve, she could be discharged home either later today or in the next few days. -SLP eval pending  Vomiting - Resolved.Suspect secondary to dysphagia- suspect this is more for regurgitation. - She was provided with supportive care and IV fluids.  History of COPD - Good air entry on exam, some scattered rhonchi - Continue with nebulized bronchodilators.  History of hypertension  - Will continue with losartan, hydrochlorothiazide, propranolol  - Follow BP trend and adjust medications accordingly  Gastroesophageal reflux disease  - c/w PPI  - History of Nissen's fundoplication 15 years back  History of cervical degenerative disc disease  - Status post recent anterior cervical decompression arthrodesis  - Please see above, neurosurgical reconsulted  - PT eval pending  History of bipolar disorder  - Currently stable, continue with preadmission medications  Disposition: Remain inpatient  DVT Prophylaxis: Prophylactic Lovenox  Code Status: Full code   Family Communication None at bedside  Procedures:  None  CONSULTS:  Neurosurgery   MEDICATIONS: Scheduled Meds: . albuterol  2.5 mg Nebulization BID  . amitriptyline  100  mg Oral QHS  . baclofen  10 mg Oral BID  . clonazePAM  1 mg Oral BID  . dexamethasone  4 mg Intravenous TID  . DULoxetine  30 mg Oral Daily  . enoxaparin (LOVENOX) injection  40 mg Subcutaneous Q24H  . hydrochlorothiazide  25 mg Oral Daily  . ipratropium  0.5 mg Nebulization BID  . lidocaine  1 patch Transdermal Q12H  . losartan  50 mg Oral Daily  . naproxen  500 mg Oral BID  . oxybutynin  10 mg Oral Daily  . pantoprazole (PROTONIX) IV  40 mg Intravenous Q12H  . propranolol  10 mg Oral TID  . simvastatin  20 mg Oral q1800   Continuous Infusions: . sodium chloride 75 mL/hr at 10/25/12 1115   PRN Meds:.acetaminophen, acetaminophen, albuterol, guaiFENesin-dextromethorphan, methocarbamol, morphine injection, ondansetron (ZOFRAN) IV, ondansetron, oxyCODONE-acetaminophen  Antibiotics: Anti-infectives   None       PHYSICAL EXAM: Vital signs in last 24 hours: Filed Vitals:   10/25/12 1417 10/25/12 2135 10/26/12 0553 10/26/12 0848  BP: 94/66 108/71 131/81   Pulse: 82 67 65   Temp: 98.7 F (37.1 C) 98.4 F (36.9 C) 98.4 F (36.9 C)   TempSrc: Oral Oral Oral   Resp: 18 17 17    Height:      Weight:      SpO2: 95% 94% 92% 99%    Weight change: -1.966 kg (-4 lb 5.4 oz) Filed Weights   10/25/12 0614 10/25/12 0913  Weight: 94.5 kg (208 lb 5.4 oz) 92.534 kg (204 lb)   Body mass index is 31.94 kg/(m^2).   Gen Exam: Awake and alert with clear speech.   Neck: Supple, No JVD.   Chest: B/L Clear.  CVS: S1 S2 Regular, no murmurs.  Abdomen: soft, BS +, non tender, non distended.  Extremities: no edema, lower extremities warm to touch. Neurologic: Non Focal.   Skin: No Rash.   Wounds: N/A.    Intake/Output from previous day:  Intake/Output Summary (Last 24 hours) at 10/26/12 0910 Last data filed at 10/25/12 2042  Gross per 24 hour  Intake    480 ml  Output   1450 ml  Net   -970 ml     LAB RESULTS: CBC  Recent Labs Lab 10/25/12 0930 10/26/12 0450  WBC 5.5 12.2*   HGB 13.2 12.9  HCT 38.3 37.7  PLT 228 279  MCV 94.3 94.5  MCH 32.5 32.3  MCHC 34.5 34.2  RDW 13.3 13.2  LYMPHSABS 1.2  --   MONOABS 0.3  --   EOSABS 0.1  --   BASOSABS 0.0  --     Chemistries   Recent Labs Lab 10/25/12 0930 10/26/12 0450  NA 130* 131*  K 3.0* 4.2  CL 90* 92*  CO2 31 31  GLUCOSE 122* 119*  BUN 11 14  CREATININE 0.89 0.79  CALCIUM 8.6 8.9  MG 1.7  --     CBG: No results found for this basename: GLUCAP,  in the last 168 hours  GFR Estimated Creatinine Clearance: 90.6 ml/min (by C-G formula based on Cr of 0.79).  Coagulation profile No results found for this basename: INR, PROTIME,  in the last 168 hours  Cardiac Enzymes No results found for this basename: CK, CKMB, TROPONINI, MYOGLOBIN,  in the last 168 hours  No components found with this basename: POCBNP,  No results found for this basename: DDIMER,  in the last 72 hours No results found for this basename: HGBA1C,  in the last 72 hours No results found for this basename: CHOL, HDL, LDLCALC, TRIG, CHOLHDL, LDLDIRECT,  in the last 72 hours No results found for this basename: TSH, T4TOTAL, FREET3, T3FREE, THYROIDAB,  in the last 72 hours No results found for this basename: VITAMINB12, FOLATE, FERRITIN, TIBC, IRON, RETICCTPCT,  in the last 72 hours No results found for this basename: LIPASE, AMYLASE,  in the last 72 hours  Urine Studies No results found for this basename: UACOL, UAPR, USPG, UPH, UTP, UGL, UKET, UBIL, UHGB, UNIT, UROB, ULEU, UEPI, UWBC, URBC, UBAC, CAST, CRYS, UCOM, BILUA,  in the last 72 hours  MICROBIOLOGY: No results found for this or any previous visit (from the past 240 hour(s)).  RADIOLOGY STUDIES/RESULTS: Dg Chest 2 View  10/13/2012   CLINICAL DATA:  Emphysema  EXAM: CHEST  2 VIEW  COMPARISON:  08/02/2012  FINDINGS: The cardiac shadow is stable. The lungs are clear bilaterally. No focal infiltrate is seen. No acute bony abnormality is noted.  IMPRESSION: No active  cardiopulmonary disease.   Electronically Signed   By: Alcide Clever M.D.   On: 10/13/2012 15:35   Dg Cervical Spine 2-3 Views  10/21/2012   CLINICAL DATA:  C6-7 ACDF.  EXAM: CERVICAL SPINE - 2-3 VIEW  COMPARISON:  Cervical MRI 09/15/2012.  FINDINGS: Two cross-table lateral views of the cervical spine are submitted. Image number 1 at 1602 hr demonstrates anterior localization of the C4-5 and C5-6 disc spaces with needles.  Image number 2 at 1651 hr demonstrates interval anterior discectomy and fusion at C6-7 with an anterior plate, screws and presumed intervertebral spacer. The hardware is not well visualized due to overlap with the shoulders. A surgical sponge is present anteriorly in the  surgical bed. No complications are identified.  IMPRESSION: Intraoperative views during C6-7 ACDF. The operative level is not well visualized.   Electronically Signed   By: Roxy Horseman M.D.   On: 10/21/2012 17:17   Mr Lumbar Spine Wo Contrast  10/07/2012   CLINICAL DATA:  Chronic, worsening low back pain radiating into both legs  EXAM: MRI LUMBAR SPINE WITHOUT CONTRAST  TECHNIQUE: Multiplanar, multisequence MR imaging was performed. No intravenous contrast was administered.  COMPARISON:  Lumbar spine MRI 12/19/2011.  FINDINGS: Again seen is postoperative change of L3-S1 fusion. Vertebral body height, signal and alignment are maintained. The conus medullaris is normal in signal and position. Imaged intra-abdominal contents are unremarkable.  The T11-12 and T12-L1 levels are imaged in the sagittal plane only and negative.  L1-2: There is some facet arthropathy and a mild disc bulge but the central canal and foramina are open. The appearance is unchanged.  L2-3: There is some facet arthropathy and a shallow disc bulge. The central canal is mildly narrowed. The neural foramina are open. The appearance is unchanged.  L3-4: Status post laminectomy and fusion. The central canal and foramina are widely patent.  L4-5: Status post  fusion. The central canal and foramina are widely patent.  L5-S1: Status post fusion. The central canal and foramina are widely patent.  IMPRESSION: No marked change in a shallow disk bulge at L2-3 causing mild central canal narrowing.  Status post L3-S1 fusion. The central canal and foramina are widely patent at each level.  No new abnormality since the prior examination.   Electronically Signed   By: Drusilla Kanner M.D.   On: 10/07/2012 12:07   Dg Chest Port 1 View  10/25/2012   CLINICAL DATA:  Shortness of Breath  EXAM: PORTABLE CHEST - 1 VIEW  COMPARISON:  October 13, 2012  FINDINGS: There is no edema or consolidation. Heart size and pulmonary vascularity are normal. No adenopathy. No pneumothorax. There is postoperative change in the lower cervical spine.  IMPRESSION: No edema or consolidation.   Electronically Signed   By: Bretta Bang M.D.   On: 10/25/2012 13:03    Jeoffrey Massed, MD  [facility] Pager:[phone]  If 7PM-7AM, please contact night-coverage [email or web address] Password [email or web address] 10/26/2012, 9:10 AM   LOS: 1 day

## 2012-10-26 NOTE — Progress Notes (Signed)
Patient states can feel self having adrenal insuffiencey. Patient states she called her endocrinologist today and now has a telemetry box on. She says the telemetry box is a result of her phone call. Patient states face is flushed, feels cold and hot. Afebrile. BP 95/64. Phoned to Dr Claiborne Billings. Dr Claiborne Billings states to notate chart to possible change IV decadron to PO on 10/27/12. Informed patient of stable vital signs and notation on chart per Dr Claiborne Billings.

## 2012-10-26 NOTE — Progress Notes (Signed)
Pt complaining of sudden crushing chest pain. Pain 8/10. VSS stable. 12 lead EKG completed and NSR. MD notified and orders received.   Tonya Sullivan

## 2012-10-26 NOTE — Evaluation (Addendum)
Clinical/Bedside Swallow Evaluation Patient Details  Name: Tonya Sullivan MRN: [id number] Date of Birth: 12/14/1955  Today's Date: 10/26/2012 Time: 1230-1230 SLP Time Calculation (min): 0 min  Past Medical History:  Past Medical History  Diagnosis Date  . COPD (chronic obstructive pulmonary disease)   . Depression   . Chronic back pain     DDD, disc bulge, radiculopathy, spinal stenosis  . Hyperlipidemia   . Anxiety   . Bipolar disorder, unspecified   . Carotid artery calcification   . DDD (degenerative disc disease), lumbosacral   . Spinal stenosis, lumbar   . Melanoma     removed 1 week ago at Dr Laurena Slimmer office  . Thyroid disease   . Autoimmune thyroiditis   . Hypertension   . Dysrhythmia     palpitations  . TIA (transient ischemic attack)     august 2014  . Pneumonia     hx of   . GERD (gastroesophageal reflux disease)   . H/O dizziness   . Neuropathic pain   . Adrenal gland dysfunction     Addison's disease   Past Surgical History:  Past Surgical History  Procedure Laterality Date  . Appendectomy    . Tonsillectomy    . Cholecystectomy    . Lump left breast      removed- benign  . Abdominal hysterectomy      tubal pregnancy  . Inguinal hernia repair    . Stomach surgery      ?holes in esophgous  . Ectopic pregnancy surgery    . Back surgery      x 5;1984;1989;;1999;2000;2010  . Colonoscopy  01/25/2012    Procedure: COLONOSCOPY;  Surgeon: Malissa Hippo, MD;  Location: [facility];  Service: Endoscopy;  Laterality: N/A;  130  . Esophagogastroduodenoscopy (egd) with esophageal dilation N/A 08/13/2012    Procedure: ESOPHAGOGASTRODUODENOSCOPY (EGD) WITH ESOPHAGEAL DILATION;  Surgeon: Malissa Hippo, MD;  Location: [facility];  Service: Endoscopy;  Laterality: N/A;  315  . Breast surgery      left breast  . Anterior cervical decomp/discectomy fusion N/A 10/21/2012    Procedure: Cervical Six-Seven Anterior cervical decompression/diskectomy/fusion;   Surgeon: Barnett Abu, MD;  Location: [facility];  Service: Neurosurgery;  Laterality: N/A;  Cervical Six-Seven Anterior cervical decompression/diskectomy/fusion   HPI:  Tonya Sullivan is a 57 y.o. 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 Apparently on discharge, patient was doing well, unfortunately upon going home she started having "gagging", and had worsening difficulty of the situation. She claims that all liquids she was eating she was doing it right back up. She then presented [facility], and then was transferred to [facility] further evaluation and treatment. Patient is not the best historian, she is not very clear whether the dysphagia is to solids and liquids, however it sounds like, it is with both and she is somewhat able to tolerate liquids better. She claims it had episodes of dysphagia in the past, but never "this bad". Apart from pain at the surgical site, she denies any other pain. She denies any abdominal pain, she denies any diarrhea. Denies any shortness of breath or chest pain. She is now being admitted for further evaluation and treatment. BSE ordered to assess risk for aspiration and recommend safest, PO diet.  Assessment / Plan / Recommendation Clinical Impression  BSE completed.  Patient with baseline esophageal based dysphagia.  MBS completed on 08/05/12 inidcates suspected  primary esophageal dysphagia with diet recommendations of dysphagia 3 and thin liquids with f/u with GI.  Increased difficulty swallowing s/p recent surgery for anterior cervical decompression.  Observed directly with room temperature thin water by cup and straw, puree and soft solids.  Patient presents well bedside with no outward clinical s/s of  aspiration noted or change in vital signs.   However, patient reporting globus sensation with all PO's initially.   Strategies of alternating bites with sips reduced sensation per patient.  No outward clinical s/s of aspiration noted but suspect postoperative pharyngeal dysphagia due to patient's description of dysphagia.  Recommend to continue clear liquid diet consistency and advance to puree consistency as tolerated with strict reflux precautions. Patient demonstrated understanding of diet recommendations and strategies.    Recommend continued ST in acute care setting for diet tolerance.  If symptoms continue recommend to f/u with GI.  Recommend 24 hour supervision upon discharge.      Aspiration Risk  Moderate    Diet Recommendation Dysphagia 1 (Puree);Thin liquid   Liquid Administration via: Cup Medication Administration: Crushed with puree Supervision: Patient able to self feed;Intermittent supervision to cue for compensatory strategies Compensations: Slow rate;Small sips/bites;Multiple dry swallows after each bite/sip;Follow solids with liquid Postural Changes and/or Swallow Maneuvers: Out of bed for meals;Upright 30-60 min after meal    Other  Recommendations Oral Care Recommendations: Oral care BID   Follow Up Recommendations  Outpatient SLP    Frequency and Duration min 1 x/week  1 week     Swallow Study Prior Functional Status  Type of Home: Mobile home Available Help at Discharge: Family;Personal care attendant;Available PRN/intermittently (Son works; Aide 2.5 hours/day)    General Date of Onset: 10/25/12 HPI: Tonya Sullivan is a 57 y.o. 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 Apparently on discharge, patient was doing well, unfortunately upon  going home she started having "gagging", and had worsening difficulty of the situation. She claims that all liquids she was eating she was doing it right back up. She then presented [facility], and then was transferred to [facility] further evaluation and treatment. Patient is not the best historian, she is not very clear whether the dysphagia is to solids and liquids, however it sounds like, it is with both and she is somewhat able to tolerate liquids better. She claims it had episodes of dysphagia in the past, but never "this bad". Apart from pain at the surgical site, she denies any other pain. She denies any abdominal pain, she denies any diarrhea. Denies any shortness of breath or chest pain. She is now being admitted for further evaluation and treatment. Type of Study: Bedside swallow evaluation Previous Swallow Assessment: MBS 08/05/12 dysphagia 3 with thin liquids Diet Prior to this Study: Thin liquids Temperature Spikes Noted: No Respiratory Status: Room air History of Recent Intubation: No Behavior/Cognition: Alert;Cooperative;Impulsive Oral Cavity - Dentition: Dentures, top Self-Feeding Abilities: Able to feed self Patient Positioning: Upright in chair Baseline Vocal Quality: Clear Volitional Cough: Strong Volitional Swallow: Able to elicit    Oral/Motor/Sensory Function Overall Oral Motor/Sensory Function: Appears within functional limits for tasks assessed   Ice Chips Ice chips:  Not tested   Thin Liquid Thin Liquid: Within functional limits Presentation: Cup;Spoon;Straw    Nectar Thick Nectar Thick Liquid: Not tested   Honey Thick Honey Thick Liquid: Not tested   Puree Puree: Within functional limits   Solid   GO    Solid: Within functional limits      Moreen Fowler MS, CCC-SLP [phone] [facility] 10/26/2012,2:43 PM

## 2012-10-26 NOTE — Progress Notes (Signed)
Informed by RN that patient had chest pain-on arrival to the patient's room-she was on the phone talking with her family, claimed no further chest pain currently. EKG neg. Place on tele and cycle cardiac enzyme. Suspect atypical-likely GI related-given long hx of GERD. Will give one dose of ASA.

## 2012-10-26 NOTE — Progress Notes (Signed)
No issues overnight. Pt reports significant improvement in dysphagia, with some difficulty with warm liquids not with cold.  EXAM:  BP 115/63  Pulse 82  Temp(Src) 98.4 F (36.9 C) (Oral)  Resp 17  Ht 5\' 7"  (1.702 m)  Wt 92.534 kg (204 lb)  BMI 31.94 kg/m2  SpO2 99%  Awake, alert, oriented  Speech fluent, appropriate  CN grossly intact  5/5 BUE/BLE   IMPRESSION:  57 y.o. female s/p ACDF with underlying esophageal dysmotility and postoperative dysphagia which appears to be improving.  PLAN: - Cont short course of decadron - F/U with Dr. Danielle Dess for routine postop check

## 2012-10-26 NOTE — Progress Notes (Signed)
INITIAL NUTRITION ASSESSMENT  DOCUMENTATION CODES Per approved criteria  -Obesity Unspecified   INTERVENTION: Provide Ensure Pudding once daily Provide Magic Cup BID Provide Multivitamin with minerals daily  NUTRITION DIAGNOSIS: Inadequate oral intake related to decreased appetite and dysphagia as evidenced by 4% wt loss in less than one month per pt report.   Goal: Pt to meet >/= 90% of their estimated nutrition needs   Monitor:  PO intake Diet advancement Weight Labs  Reason for Assessment: Malnutrition Screening Tool, score of 3  57 y.o. female  Admitting Dx: Dysphagia, unspecified(787.20)  ASSESSMENT: 57 y.o. 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 Apparently on discharge, patient was doing well, unfortunately upon going home she started having "gagging", and had worsening difficulty of the situation.   Pt reports having difficulty swallowing and having a poor appetite for a few weeks. Pt states that she usually weighs 204 lbs and recently lost down to 195 lbs. Pt complains of a poor appetite today; she states milk causes mucous build up and broth/soup burns her throat. Discussed nutritious foods that are easy to swallow. Encouraged protein intake; reviewed protein-rich foods on full liquid diet. Pt states she is feeling better and is ready to go home.   Height: Ht Readings from Last 1 Encounters:  10/25/12 5\' 7"  (1.702 m)    Weight: Wt Readings from Last 1 Encounters:  10/25/12 204 lb (92.534 kg)    Ideal Body Weight: 135 lbs  % Ideal Body Weight: 151%  Wt Readings from Last 10 Encounters:  10/25/12 204 lb (92.534 kg)  10/21/12 201 lb 15.1 oz (91.6 kg)  10/21/12 201 lb 15.1 oz (91.6 kg)  10/20/12 202 lb (91.627 kg)  10/15/12  208 lb (94.348 kg)  10/13/12 208 lb 1.6 oz (94.394 kg)  10/07/12 203 lb (92.08 kg)  10/04/12 194 lb (87.998 kg)  10/01/12 196 lb (88.905 kg)  09/15/12 196 lb (88.905 kg)    Usual Body Weight: 204 lbs  % Usual Body Weight: 100%  BMI:  Body mass index is 31.94 kg/(m^2).  Estimated Nutritional Needs: Kcal: 1800-2000 Protein: 130-140 grams Fluid: 2.4-2.6 L/day  Skin: neck incision  Diet Order: Full Liquid  EDUCATION NEEDS: -No education needs identified at this time   Intake/Output Summary (Last 24 hours) at 10/26/12 1328 Last data filed at 10/25/12 2042  Gross per 24 hour  Intake    480 ml  Output   1450 ml  Net   -970 ml    Last BM: 10/23   Labs:   Recent Labs Lab 10/25/12 0930 10/26/12 0450  NA 130* 131*  K 3.0* 4.2  CL 90* 92*  CO2 31 31  BUN 11 14  CREATININE 0.89 0.79  CALCIUM 8.6 8.9  MG 1.7  --   GLUCOSE 122* 119*    CBG (last 3)  No results found for this basename: GLUCAP,  in the last 72 hours  Scheduled Meds: . albuterol  2.5 mg Nebulization BID  . amitriptyline  100 mg Oral QHS  . baclofen  10 mg Oral BID  . clonazePAM  1 mg Oral BID  . dexamethasone  4 mg Intravenous TID  . DULoxetine  30 mg Oral Daily  . enoxaparin (LOVENOX) injection  40 mg Subcutaneous Q24H  . hydrochlorothiazide  25 mg Oral Daily  .  ipratropium  0.5 mg Nebulization BID  . lidocaine  1 patch Transdermal Q12H  . losartan  50 mg Oral Daily  . naproxen  500 mg Oral BID  . nicotine  14 mg Transdermal Daily  . oxybutynin  10 mg Oral Daily  . pantoprazole (PROTONIX) IV  40 mg Intravenous Q12H  . potassium chloride  20 mEq Oral Daily  . propranolol  10 mg Oral TID  . simvastatin  20 mg Oral q1800    Continuous Infusions: . sodium chloride 20 mL/hr at 10/26/12 0915    Past Medical History  Diagnosis Date  . COPD (chronic obstructive pulmonary disease)   . Depression   . Chronic back pain     DDD, disc bulge, radiculopathy, spinal stenosis  . Hyperlipidemia    . Anxiety   . Bipolar disorder, unspecified   . Carotid artery calcification   . DDD (degenerative disc disease), lumbosacral   . Spinal stenosis, lumbar   . Melanoma     removed 1 week ago at Dr Laurena Slimmer office  . Thyroid disease   . Autoimmune thyroiditis   . Hypertension   . Dysrhythmia     palpitations  . TIA (transient ischemic attack)     august 2014  . Pneumonia     hx of   . GERD (gastroesophageal reflux disease)   . H/O dizziness   . Neuropathic pain   . Adrenal gland dysfunction     Addison's disease    Past Surgical History  Procedure Laterality Date  . Appendectomy    . Tonsillectomy    . Cholecystectomy    . Lump left breast      removed- benign  . Abdominal hysterectomy      tubal pregnancy  . Inguinal hernia repair    . Stomach surgery      ?holes in esophgous  . Ectopic pregnancy surgery    . Back surgery      x 5;1984;1989;;1999;2000;2010  . Colonoscopy  01/25/2012    Procedure: COLONOSCOPY;  Surgeon: Malissa Hippo, MD;  Location: [facility];  Service: Endoscopy;  Laterality: N/A;  130  . Esophagogastroduodenoscopy (egd) with esophageal dilation N/A 08/13/2012    Procedure: ESOPHAGOGASTRODUODENOSCOPY (EGD) WITH ESOPHAGEAL DILATION;  Surgeon: Malissa Hippo, MD;  Location: [facility];  Service: Endoscopy;  Laterality: N/A;  315  . Breast surgery      left breast  . Anterior cervical decomp/discectomy fusion N/A 10/21/2012    Procedure: Cervical Six-Seven Anterior cervical decompression/diskectomy/fusion;  Surgeon: Barnett Abu, MD;  Location: [facility];  Service: Neurosurgery;  Laterality: N/A;  Cervical Six-Seven Anterior cervical decompression/diskectomy/fusion    Ian Malkin RD, LDN Inpatient Clinical Dietitian Pager: [phone] After Hours Pager: [phone]

## 2012-10-27 ENCOUNTER — Telehealth: Payer: Self-pay | Admitting: Family Medicine

## 2012-10-27 ENCOUNTER — Telehealth (HOSPITAL_COMMUNITY): Payer: Self-pay | Admitting: *Deleted

## 2012-10-27 DIAGNOSIS — F329 Major depressive disorder, single episode, unspecified: Secondary | ICD-10-CM

## 2012-10-27 LAB — TROPONIN I
Troponin I: <0.3 ng/mL (ref <0.30)
Troponin I: <0.3 ng/mL (ref <0.30)

## 2012-10-27 MED ORDER — NICOTINE 14 MG/24HR TD PT24: 1.0000 | MEDICATED_PATCH | Freq: Every day | TRANSDERMAL | Status: DC

## 2012-10-27 MED ORDER — PREDNISONE 2.5 MG PO TABS: 2.5000 mg | ORAL_TABLET | Freq: Every day | ORAL | Status: DC

## 2012-10-27 MED ORDER — DEXAMETHASONE 4 MG PO TABS
4.0000 mg | ORAL_TABLET | Freq: Three times a day (TID) | ORAL | Status: DC
  Administered 2012-10-27: 4 mg via ORAL
  Filled 2012-10-27 (×3): qty 1

## 2012-10-27 MED ORDER — PREDNISONE 5 MG PO TABS: 5.0000 mg | ORAL_TABLET | Freq: Every day | ORAL | Status: DC

## 2012-10-27 MED ORDER — PREDNISONE 20 MG PO TABS: 20.0000 mg | ORAL_TABLET | Freq: Every day | ORAL | Status: DC

## 2012-10-27 MED ORDER — PANTOPRAZOLE SODIUM 40 MG PO TBEC
40.0000 mg | DELAYED_RELEASE_TABLET | Freq: Two times a day (BID) | ORAL | Status: DC
  Administered 2012-10-27: 10:00:00 40 mg via ORAL
  Filled 2012-10-27: qty 1

## 2012-10-27 NOTE — Discharge Summary (Signed)
Physician Discharge Summary  Tonya Sullivan ZOX:[id number] DOB: 1955/01/21 DOA: 10/25/2012  PCP: Milinda Antis, MD  Admit date: 10/25/2012 Discharge date: 10/27/2012  Time spent: 50 minutes  Recommendations for Outpatient Follow-up:  1. Patient will follow up with Dr. Danielle Dess for post op care. 2. Please see PCP (Check bmet and cbc) within 1 - 2 weeks.  Discharge Diagnoses:  Principal Problem:   Dysphagia, unspecified(787.20) Active Problems:   COPD (chronic obstructive pulmonary disease)   Chronic back pain   Bipolar disorder   Essential hypertension   Discharge Condition: stable.  Tolerating a soft diet.   Diet recommendation: soft as tolerated.  Filed Weights   10/25/12 0614 10/25/12 0913  Weight: 94.5 kg (208 lb 5.4 oz) 92.534 kg (204 lb)    History of present illness at the time of admission:  Tonya Sullivan is a 57 y.o. 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 Apparently on discharge, patient was doing well, unfortunately upon going home she started having "gagging", and had worsening difficulty of the situation. She claims that all liquids she was eating were coming right back up. She then presented to [facility], and was transferred to [facility] for further evaluation and treatment.  Hospital Course:  Dysphagia  - Suspect this is related to esophageal inflammation following anterior cervical decompression arthrodesis, as this occurred in the postoperative course. She also has a history of underlying primary esophageal motility disorder. In any event, she was admitted and started on IV Decadron, IV PPI, clear liquid diet and given other supportive care.  -She improved and was will  advanced to a soft solid diet.   -Speech evaluation was obtained.  They recommended a dysphagia 1 diet and continued speech therapy services post discharge. Today her diet was advanced to a dysphagia 3 diet, which she started well, she is requesting discharge, she is considered stable to be discharged home. Patient is on chronic steroids and home for Addison's disease (claims to have been recently diagnosed), we will slowly taper her steroids back to her home dose- please see discharge medications. I have asked her to make an appointment with her primary gastroenterologist Dr.Rehman  Vomiting  - Resolved.Suspect secondary to dysphagia - suspect this is more regurgitation.  - She was provided with supportive care and IV fluids.   Atypical chest pain - In the hospital course, she had atypical chest pain. Cardiac enzymes were cycled she was placed on telemetry. Cardiac enzymes are negative. Pain was thought to be more from a GI issues.  History of COPD  - Stable. - Good air entry on exam, some scattered rhonchi  - Continue with nebulized bronchodilators inpatient - Resume home meds on discharge.  History of hypertension  -Stable. - Will continue with losartan, hydrochlorothiazide, propranolol   Gastroesophageal reflux disease  - c/w PPI  - History of Nissen's fundoplication 15 years back   History of cervical degenerative disc disease  - Status post recent anterior cervical decompression arthrodesis  - Neurosurgery consulted.  Agreed with a short course of Decadron inpatient.  - Post Op follow up with Dr. Danielle Dess. - PT evaluated the patient and recommended home health PT services.  History of bipolar disorder  - Currently stable, continue with preadmission medications   Consultations:  Neurosurgery.  Discharge  Exam: Filed Vitals:   10/27/12 1008  BP: 120/70  Pulse: 88  Temp:   Resp:    Gen Exam: Awake and alert with clear speech. Sitting in chair. Neck: Supple, No JVD.  Anterior incision healing well. +bruising. Chest: B/L Clear. No w/c/r CVS: S1 S2 Regular, no murmurs. 1 - 2+Bilateral LE ededma Abdomen: soft, BS +, non tender, non distended.  Extremities: Able to move all 4.  lower extremities warm to touch.  Neurologic: Non Focal.  Skin:  Multiple small bruises on arms bilaterally. Wounds: N/A   Discharge Instructions      Discharge Orders   Future Orders Complete By Expires   DIET DYS 3  As directed    Increase activity slowly  As directed        Medication List         albuterol (2.5 MG/3ML) 0.083% nebulizer solution  Commonly known as:  PROVENTIL  Take 3 mLs (2.5 mg total) by nebulization every 4 (four) hours as needed for wheezing.     albuterol 108 (90 BASE) MCG/ACT inhaler  Commonly known as:  PROVENTIL HFA;VENTOLIN HFA  Inhale 2 puffs into the lungs 3 (three) times daily.     amitriptyline 50 MG tablet  Commonly known as:  ELAVIL  Take 2 tablets (100 mg total) by mouth at bedtime.     baclofen 10 MG tablet  Commonly known as:  LIORESAL  Take 10 mg by mouth 2 (two) times daily.     calcium carbonate 1250 MG chewable tablet  Commonly known as:  OS-CAL  Chew 1 tablet by mouth daily as needed for heartburn.     clonazePAM 1 MG tablet  Commonly known as:  KLONOPIN  Take 1 tablet (1 mg total) by mouth 2 (two) times daily.     hydrochlorothiazide 25 MG tablet  Commonly known as:  HYDRODIURIL  Take 1 tablet (25 mg total) by mouth daily. For blood pressure and swelling     lidocaine 5 %  Commonly known as:  LIDODERM  Place 1 patch onto the skin every 12 (twelve) hours. Remove & Discard patch within 12 hours or as directed by MD     losartan 50 MG tablet  Commonly known as:  COZAAR  Take 1 tablet (50 mg total) by mouth daily.     methocarbamol 500 MG tablet  Commonly known as:  ROBAXIN  Take 1 tablet (500 mg total) by mouth every 6 (six) hours as needed.     naproxen 500 MG tablet  Commonly known as:  NAPROSYN  Take  500 mg by mouth 2 (two) times daily.     nicotine 14 mg/24hr patch  Commonly known as:  NICODERM CQ - dosed in mg/24 hours  Place 1 patch onto the skin daily.     oxybutynin 10 MG 24 hr tablet  Commonly known as:  DITROPAN XL  Take 1 tablet (10 mg total) by mouth daily.     oxyCODONE-acetaminophen 10-325 MG per tablet  Commonly known as:  PERCOCET  Take 1 tablet by mouth every 8 (eight) hours as needed for pain.     pravastatin 40 MG tablet  Commonly known as:  PRAVACHOL  Take 1 tablet (40 mg total) by mouth every evening.     predniSONE 2.5 MG tablet  Commonly known as:  DELTASONE  Take 1 tablet (2.5 mg total) by mouth daily. Begin taking this dose on 10/31.     predniSONE 5 MG tablet  Commonly known  as:  DELTASONE  Take 1 tablet (5 mg total) by mouth daily. Begin taking this dose on 10/31.     predniSONE 20 MG tablet  Commonly known as:  DELTASONE  Take 1 tablet (20 mg total) by mouth daily. Take for 3 days then resume previous daily dose of prednisone.     propranolol 10 MG tablet  Commonly known as:  INDERAL  Take 1 tablet (10 mg total) by mouth 3 (three) times daily.     tiotropium 18 MCG inhalation capsule  Commonly known as:  SPIRIVA HANDIHALER  Place 1 capsule (18 mcg total) into inhaler and inhale daily.       Allergies  Allergen Reactions  . Codeine Itching  . Cyclobenzaprine Hives  . Darvocet [Propoxyphene-Acetaminophen] Rash  . Abilify [Aripiprazole] Other (See Comments)    tremors  . Ace Inhibitors   . Adhesive [Tape] Hives  . Iodine Hives  . Levofloxacin Hives and Hypertension  . Sulfa Antibiotics Itching   Follow-up Information   Follow up with Stefani Dama, MD. Schedule an appointment as soon as possible for a visit in 2 weeks. (Post op follow up.)    Specialty:  Neurosurgery   Contact information:   [address] [place] [address] [phone]       Follow up with Milinda Antis, MD. Schedule an appointment as soon  as possible for a visit in 1 week.   Specialty:  Family Medicine   Contact information:   [address] [place] [address] [phone]       Follow up with [facility]. Schedule an appointment as soon as possible for a visit in 1 week.   Specialty:  Gastroenterology   Contact information:   [address] [place] [address] [phone]       The results of significant diagnostics from this hospitalization (including imaging, microbiology, ancillary and laboratory) are listed below for reference.    Significant Diagnostic Studies:  Dg Chest Port 1 View  10/25/2012   CLINICAL DATA:  Shortness of Breath  EXAM: PORTABLE CHEST - 1 VIEW  COMPARISON:  October 13, 2012  FINDINGS: There is no edema or consolidation. Heart size and pulmonary vascularity are normal. No adenopathy. No pneumothorax. There is postoperative change in the lower cervical spine.  IMPRESSION: No edema or consolidation.   Electronically Signed   By: Bretta Bang M.D.   On: 10/25/2012 13:03     Labs: Basic Metabolic Panel:  Recent Labs Lab 10/25/12 0930 10/26/12 0450  NA 130* 131*  K 3.0* 4.2  CL 90* 92*  CO2 31 31  GLUCOSE 122* 119*  BUN 11 14  CREATININE 0.89 0.79  CALCIUM 8.6 8.9  MG 1.7  --    Liver Function Tests:  Recent Labs Lab 10/25/12 0930 10/26/12 0450  AST 22 16  ALT 18 17  ALKPHOS 45 48  BILITOT 0.4 0.2*  PROT 6.5 6.7  ALBUMIN 3.3* 3.1*   CBC:  Recent Labs Lab 10/25/12 0930 10/26/12 0450  WBC 5.5 12.2*  NEUTROABS 3.8  --   HGB 13.2 12.9  HCT 38.3 37.7  MCV 94.3 94.5  PLT 228 279   Cardiac Enzymes:  Recent Labs Lab 10/26/12 1921 10/27/12 0038 10/27/12 0610  TROPONINI <0.30 <0.30 <0.30    BNP (last 3 results)  Recent Labs  08/03/12 0850 10/25/12 0930  PROBNP 745.1* 209.0*    Signed:   Algis Downs, PA-C  [phone] [facility] 10/27/2012, 10:47 AM  Attending Patient seen and  examined, agree with the above assessment and plan. Doing much better overnight, has tolerated a dysphagia 3 diet this morning. She is stable to be discharged. We'll have physical therapy, and speech therapy follow her in the outpatient setting. I've also asked her to make an appointment with her primary gastroenterologist within a week. We will gradually taper her steroids back to her usual dose.  Windell Norfolk MD

## 2012-10-27 NOTE — Progress Notes (Signed)
Patient evaluated for community based chronic disease management services with [facility] Care Management Program as a benefit of patient's Medicare Insurance and the COPD [employer]. Spoke with patient at bedside to explain [facility] Care Management services.  She is a 57 y.o. 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 Apparently on discharge, patient was doing well, unfortunately upon going home she started having "gagging", and had worsening difficulty of the situation. She claims that all liquids she was eating she was doing it right back up. She then presented to [facility], and then was transferred to [facility] on 10/25/12 further evaluation and treatment.  She lives with her son and receives care assistance from him and her daughter in Social worker.  She can afford her medications, and has reliable transportation to appointments.  She has a history of bipolar disorder that is actively managed by [facility] (Dr Tenny Craw) in [place].  She has a history of falls with the last being in March 2014.  This resulted in her recent surgery after medical management failed to address her chronic pain.  Patient will receive a post discharge transition of care call and will be evaluated for monthly home visits for assessments and disease process education.  We will provide a LCSW for review of community resources to ensure that her new move to her son's home is stable because she indicated that she feels her presence is overwhelming to his family.  She may benefit from continued use of dietary supplements.  She used ensure during this admission and admits that she has continued difficulty swallowing.  She has PCS services in place with [facility]  [phone]).  The office manager is Marigene Ehlers.  They provide 2.5 hours a day with a total of 8 hours per week.  She is on the CAPS waiting list for an increase.  Currently attempted to quit smoking.  Using Nicoderm CQ patches during this admission.  Left contact information and [facility] literature at bedside. Made Inpatient Case Manager aware that [facility] Care Management following. Of note, [facility] Care Management services does not replace or interfere with any services that are arranged by inpatient case management or social work.  For additional questions or referrals please contact Anibal Henderson BSN RN [facility] Liaison at [phone].

## 2012-10-27 NOTE — Progress Notes (Signed)
Patient will receive HHRN PT and ST services from [facility] per RNCM.  Of note, [facility] Care Management services does not replace or interfere with any services that are arranged by inpatient case management or social work.  For additional questions or referrals please contact Anibal Henderson BSN RN [facility] Liaison at [phone].

## 2012-10-27 NOTE — Care Management Note (Signed)
    Page 1 of 1   10/27/2012     12:49:04 PM   CARE MANAGEMENT NOTE 10/27/2012  Patient:  Tonya Sullivan, Tonya Sullivan   Account Number:  [id number]  Date Initiated:  10/27/2012  Documentation initiated by:  Letha Cape  Subjective/Objective Assessment:   dx dysphagia  admit- lives with son.     Action/Plan:   Anticipated DC Date:  10/27/2012   Anticipated DC Plan:  HOME W HOME HEALTH SERVICES      DC Planning Services  CM consult      [facility] Choice  HOME HEALTH   Choice offered to / List presented to:  C-1 Patient        HH arranged  HH-1 RN  HH-2 PT  HH-5 SPEECH THERAPY      HH agency  [facility]   Status of service:  Completed, signed off Medicare Important Message given?   (If response is "NO", the following Medicare IM given date fields will be blank) Date Medicare IM given:   Date Additional Medicare IM given:    Discharge Disposition:  HOME W HOME HEALTH SERVICES  Per UR Regulation:  Reviewed for med. necessity/level of care/duration of stay  If discussed at Long Length of Stay Meetings, dates discussed:    Comments:  10/27/12 12:47 Letha Cape RN, BSN [phone] patient for dc today, has transportation, patient chose [facility] for [facility] services , referral made to [facility] for [facility], PT, ST, Patient also will be monitored by [facility]- who will follow up with a Child psychotherapist.  Soc will begin 24-48 hrs post discharge.

## 2012-10-27 NOTE — Telephone Encounter (Signed)
Patient is being discharged with in the hour for the hospital. Patient needs assistants at home for rehab. Please call and set this up. [facility] . [phone].

## 2012-10-27 NOTE — Telephone Encounter (Signed)
I cannot provide meds to an inpt

## 2012-10-27 NOTE — Progress Notes (Signed)
Patient discharge teaching given, including activity, diet, follow-up appoints, and medications. Patient verbalized understanding of all discharge instructions. IV access was d/c'd. Vitals are stable. Skin is intact except as charted in most recent assessments. Pt to be escorted out by NT, to be driven home by family. Pt waiting for ride to arrive later this afternoon.

## 2012-10-27 NOTE — Progress Notes (Signed)
Please assess amount of depressant medications patient takes. Patient is ordered clonopin, xanax, robaxin and percocet.

## 2012-10-29 ENCOUNTER — Encounter (HOSPITAL_COMMUNITY): Payer: Self-pay | Admitting: Psychiatry

## 2012-10-29 ENCOUNTER — Ambulatory Visit (INDEPENDENT_AMBULATORY_CARE_PROVIDER_SITE_OTHER): Payer: Medicare Other | Admitting: Psychiatry

## 2012-10-29 ENCOUNTER — Ambulatory Visit (HOSPITAL_COMMUNITY): Payer: Self-pay | Admitting: Psychiatry

## 2012-10-29 ENCOUNTER — Ambulatory Visit: Payer: Medicare Other | Admitting: Family Medicine

## 2012-10-29 VITALS — BP 140/90 | Ht 67.0 in | Wt 206.0 lb

## 2012-10-29 DIAGNOSIS — F411 Generalized anxiety disorder: Secondary | ICD-10-CM

## 2012-10-29 DIAGNOSIS — F319 Bipolar disorder, unspecified: Secondary | ICD-10-CM

## 2012-10-29 DIAGNOSIS — F332 Major depressive disorder, recurrent severe without psychotic features: Secondary | ICD-10-CM

## 2012-10-29 MED ORDER — CLONAZEPAM 1 MG PO TABS: 1.0000 mg | ORAL_TABLET | Freq: Four times a day (QID) | ORAL | Status: DC
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
## 2012-10-31 ENCOUNTER — Ambulatory Visit (HOSPITAL_COMMUNITY): Payer: Self-pay | Admitting: Psychiatry

## 2012-10-31 ENCOUNTER — Telehealth: Payer: Self-pay | Admitting: Family Medicine

## 2012-10-31 ENCOUNTER — Ambulatory Visit (INDEPENDENT_AMBULATORY_CARE_PROVIDER_SITE_OTHER): Payer: Medicare Other | Admitting: Family Medicine

## 2012-10-31 ENCOUNTER — Encounter: Payer: Self-pay | Admitting: Family Medicine

## 2012-10-31 VITALS — BP 90/60 | HR 80 | Temp 98.9°F | Resp 20 | Ht 67.0 in | Wt 207.0 lb

## 2012-10-31 DIAGNOSIS — G8929 Other chronic pain: Secondary | ICD-10-CM

## 2012-10-31 DIAGNOSIS — E2749 Other adrenocortical insufficiency: Secondary | ICD-10-CM

## 2012-10-31 DIAGNOSIS — J449 Chronic obstructive pulmonary disease, unspecified: Secondary | ICD-10-CM

## 2012-10-31 DIAGNOSIS — R609 Edema, unspecified: Secondary | ICD-10-CM

## 2012-10-31 DIAGNOSIS — E2740 Unspecified adrenocortical insufficiency: Secondary | ICD-10-CM

## 2012-10-31 DIAGNOSIS — M549 Dorsalgia, unspecified: Secondary | ICD-10-CM

## 2012-10-31 DIAGNOSIS — R269 Unspecified abnormalities of gait and mobility: Secondary | ICD-10-CM

## 2012-10-31 DIAGNOSIS — M47812 Spondylosis without myelopathy or radiculopathy, cervical region: Secondary | ICD-10-CM

## 2012-10-31 NOTE — Telephone Encounter (Signed)
Pt has appt this afternoon will discuss then She will need to look into ALF or SNF for her rehab

## 2012-10-31 NOTE — Telephone Encounter (Signed)
Tonya Sullivan called saying that there is a delay in SS will be completed when he reaches patient

## 2012-10-31 NOTE — Telephone Encounter (Signed)
Pt in office, discussed with son as well, pt gave permission to do so Placed in SNF today- [facility] involved

## 2012-10-31 NOTE — Progress Notes (Signed)
  Subjective:    Patient ID: Tonya Sullivan, female    DOB: April 25, 1955, 57 y.o.   MRN: [id number]  HPI  Patient here to followup hospital admission. She recently had anterior cervical decompression and discectomy. She was then readmitted on 1025 secondary to dysphasia and difficulty breathing. This was thought to be due to some swelling around the neck as well as and gastro-esophageal dysmotility. She's here today with concern about being at home. Her daughter and son no longer want her in the home states that they cannot care for her. They refused to have her physical therapy speech therapist and nurse come to the home to help take care of her. [facility] as [facility] regarding the patient and need for placement. She tells me that she stay with a friend last night . Evidently when she was discharged she did not meet criteria for skilled nursing facility therefore was discharged home on October 27. She has no one to help with her medications and to assist her with bathing and dressing. She also has difficulty getting to appointments at this time.   note she was also recently diagnosed with adrenal insufficieny/addison's and is now on prednisone daily.     Review of Systems  GEN- + fatigue, fever, weight loss,weakness, recent illness HEENT- denies eye drainage, change in vision, nasal discharge, CVS- denies chest pain, palpitations RESP- denies SOB, cough, wheeze ABD- denies N/V, change in stools, abd pain GU- denies dysuria, hematuria, dribbling, incontinence MSK- + joint pain, muscle aches, injury Neuro- denies headache, dizziness, syncope, seizure activity      Objective:   Physical Exam GEN- NAD, alert and oriented x3 HEENT- PERRL, EOMI, non injected sclera, pink conjunctiva, MMM, oropharynx clear, mild facial swelling Neck- mild swelling, with eccymosis beneath chin, scar d/c/i  CVS- RRR, no murmur RESP-CTAB EXT- +1  edema Pulses- Radial 2+ Skin-  ecchymosis bilateral arms, Right hand large area of ecchymosis/bruising, no blanching, few scabs       Assessment & Plan:   Approx 1 hour- spent with patient on coordination of care

## 2012-10-31 NOTE — Assessment & Plan Note (Signed)
Currently stable does not cause of her difficulty breathing it was more of the dysphasia and neck swelling status post surgery

## 2012-10-31 NOTE — Telephone Encounter (Signed)
Tonya Sullivan with [facility]) . Called and left a message for Dr. Jeanice Lim . Would Dr. Jeanice Lim please fill out her FL2 while she is at her appointment .  They is and opening coming up at [facility] . Fax [phone]. Still waiting on word from [facility].    Tonya Sullivan [phone]

## 2012-10-31 NOTE — Assessment & Plan Note (Signed)
Difficulty with day-to-day activities worsened since she's been discharged from the hospital and her recent surgery. She would benefit from being in assisted living or a skilled nursing facility

## 2012-10-31 NOTE — Assessment & Plan Note (Signed)
Continue prednisone, reviewed endocrinology note

## 2012-10-31 NOTE — Assessment & Plan Note (Signed)
Elevate feet, continue diuretic,

## 2012-10-31 NOTE — Assessment & Plan Note (Signed)
She's currently in a very poor social situation. I think that she needs increased level of care and for her to recover and decrease her chance her readmission to the hospital. I've completed a full to form on her behalf and I have spoken with [facility] in which we will try to get her placement this weekend.

## 2012-10-31 NOTE — Telephone Encounter (Signed)
[facility] called in requesting Home Placement for Tonya Sullivan . Tonya Sullivan states that she doesn't feel welcome in her home anymore and would like for [place] to place her somewhere she can feel more at home . Her family is not wanting her PT, her Speech There, or anyone else to come to there home . So she wants to be placed out side the home .

## 2012-11-03 ENCOUNTER — Ambulatory Visit (INDEPENDENT_AMBULATORY_CARE_PROVIDER_SITE_OTHER): Payer: Medicare Other | Admitting: Internal Medicine

## 2012-11-03 ENCOUNTER — Inpatient Hospital Stay: Payer: Medicare Other | Admitting: Family Medicine

## 2012-11-03 ENCOUNTER — Ambulatory Visit (INDEPENDENT_AMBULATORY_CARE_PROVIDER_SITE_OTHER): Payer: Self-pay | Admitting: Internal Medicine

## 2012-11-03 ENCOUNTER — Encounter (INDEPENDENT_AMBULATORY_CARE_PROVIDER_SITE_OTHER): Payer: Self-pay | Admitting: Internal Medicine

## 2012-11-03 ENCOUNTER — Ambulatory Visit (INDEPENDENT_AMBULATORY_CARE_PROVIDER_SITE_OTHER): Payer: Medicare Other | Admitting: Psychiatry

## 2012-11-03 VITALS — BP 116/70 | HR 76 | Temp 97.0°F | Resp 16 | Ht 67.0 in | Wt 206.6 lb

## 2012-11-03 DIAGNOSIS — F319 Bipolar disorder, unspecified: Secondary | ICD-10-CM

## 2012-11-03 DIAGNOSIS — R1310 Dysphagia, unspecified: Secondary | ICD-10-CM

## 2012-11-03 NOTE — Patient Instructions (Signed)
Mechanical soft diet. Physician will contact you with results of barium pill esophagogram

## 2012-11-03 NOTE — Patient Instructions (Signed)
Discussed orally 

## 2012-11-03 NOTE — Progress Notes (Signed)
Presenting complaint;  Dysphagia.  Subjective:  Patient is 57 year old Caucasian female who presents for reevaluation of dysphagia primarily to solids and occasionally to liquids. Dysphagia started about 5 months ago. She was initially evaluated while hospitalized at [facility] for aspiration pneumonia. She underwent evaluation by speech pathologist as well as by gastroenterologist. On evaluation by speech pathologist she was felt to have his aphasia and dysphagia with second referring usual disorder. She was subsequently evaluated in [place] and underwent EGD on 08/13/2012. This exam reveals no evidence of esophageal web ring or stricture and she had short fundal wrap. She had nonerosive antral gastritis and small scar. H. pylori serology was negative. Her esophagus was dilated by passing 54 and 56 [place] dilators but without symptomatic improvement. She now presents with worsening dysphagia and intermittent hoarseness. She states he symptoms have gotten worse since she had neck surgery on 10/21/2012. She had anterior cervical discectomy decompression of nerve roots and spinal canal at C6-C7 with arthrodesis and structural allograft and plate fixation and C6-C7. She denies nausea or vomiting. She also denies cough or shortness of breath. She also denies heartburn. She previously treated with PPI but without symptomatic improvement.  Current Medications: Current Outpatient Prescriptions  Medication Sig Dispense Refill  . albuterol (PROVENTIL HFA;VENTOLIN HFA) 108 (90 BASE) MCG/ACT inhaler Inhale 2 puffs into the lungs 3 (three) times daily.  1 Inhaler  11  . albuterol (PROVENTIL) (2.5 MG/3ML) 0.083% nebulizer solution Take 3 mLs (2.5 mg total) by nebulization every 4 (four) hours as needed for wheezing.  75 mL  3  . amitriptyline (ELAVIL) 50 MG tablet Take 2 tablets (100 mg total) by mouth at bedtime.  60 tablet  2  . baclofen (LIORESAL) 10 MG tablet Take 10 mg by mouth 2 (two) times daily.       . calcium carbonate (OS-CAL) 1250 MG chewable tablet Chew 1 tablet by mouth daily as needed for heartburn.      . clonazePAM (KLONOPIN) 1 MG tablet Take 1 tablet (1 mg total) by mouth 4 (four) times daily.  120 tablet  2  . gabapentin (NEURONTIN) 300 MG capsule Take 300 mg by mouth 3 (three) times daily.       . hydrochlorothiazide (HYDRODIURIL) 25 MG tablet Take 1 tablet (25 mg total) by mouth daily. For blood pressure and swelling  30 tablet  3  . lidocaine (LIDODERM) 5 % Place 1 patch onto the skin every 12 (twelve) hours. Remove & Discard patch within 12 hours or as directed by MD  30 patch  2  . losartan (COZAAR) 50 MG tablet Take 1 tablet (50 mg total) by mouth daily.  90 tablet  3  . methocarbamol (ROBAXIN) 500 MG tablet Take 1 tablet (500 mg total) by mouth every 6 (six) hours as needed.  60 tablet  3  . naproxen (NAPROSYN) 500 MG tablet Take 500 mg by mouth 2 (two) times daily.      Marland Kitchen oxybutynin (DITROPAN XL) 10 MG 24 hr tablet Take 1 tablet (10 mg total) by mouth daily.  30 tablet  3  . oxyCODONE-acetaminophen (PERCOCET) 10-325 MG per tablet Take 1 tablet by mouth every 6 (six) hours as needed for pain.       . pravastatin (PRAVACHOL) 40 MG tablet Take 1 tablet (40 mg total) by mouth every evening.  30 tablet  3  . predniSONE (DELTASONE) 2.5 MG tablet Take 1 tablet (2.5 mg total) by mouth daily. Begin taking this dose on 10/31.      Marland Kitchen  predniSONE (DELTASONE) 5 MG tablet Take 1 tablet (5 mg total) by mouth daily. Begin taking this dose on 10/31.      Marland Kitchen propranolol (INDERAL) 10 MG tablet Take 1 tablet (10 mg total) by mouth 3 (three) times daily.  90 tablet  2  . tiotropium (SPIRIVA HANDIHALER) 18 MCG inhalation capsule Place 1 capsule (18 mcg total) into inhaler and inhale daily.  30 capsule  6  . [DISCONTINUED] Gabapentin, PHN, (GRALISE STARTER) 300 & 600 MG MISC Take 300-1,800 mg by mouth as directed. 15 DAY Starter pack doses     DAY 1- 300 mg     DAY 2- 600 mg     DAY 3 to 6 - 900 mg     DAY 7 to 10 - 1200 mg     DAY 11 to 14 - 1500 mg      DAY 15 - 1800 mg        No current facility-administered medications for this visit.     Objective: Blood pressure 116/70, pulse 76, temperature 97 F (36.1 C), temperature source Oral, resp. rate 16, height 5\' 7"  (1.702 m), weight 206 lb 9.6 oz (93.713 kg). Patient is alert and in no acute distress. She is hoarse. Conjunctiva is pink. Sclera is nonicteric Oropharyngeal mucosa is normal. No neck masses or thyromegaly noted. She has horizontal scar to the left of midline and area of ecchymosis above that.  Cardiac exam with regular rhythm normal S1 and S2. No murmur or gallop noted. Lungs are clear to auscultation. Abdomen is soft and nontender without organomegaly or masses. No LE edema or clubbing noted.    Assessment:  #1. Dysphagia primarily to solids. 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 Recent symptomatic worsening may be related to neck surgery. She needs to be further evaluated as this symptom appears to be quite bothersome to her.    Plan:  Repeat barium pill esophagogram. If this study is normal then proceed with esophageal manometry and impedance study.

## 2012-11-03 NOTE — Progress Notes (Signed)
Patient:  Tonya Sullivan   DOB: October 17, 1955  MR Number: [id number]  [facility]: [facility]:  [address] [place] [place],  [place], [address]  Start: Monday 11/03/2012 11:10 AM  Monday 11/03/2012 11:45 AM End:   Provider/Observer:     Florencia Reasons, MSW, LCSW   Chief Complaint:      Chief Complaint  Patient presents with  . Anxiety  . Depression    Reason For Service:     The patient was referred for services by psychiatrist Dr. Lolly Mustache due to patient experiencing anxiety, stress, depression, and mood swings. She has a long-standing history of depression beginning in adolescence. Patient also has significant trauma history. Patient also experienced grief and loss issues due to to the death of her husband in September 15, 2010.  She continues to experience significant anxiety and stress related to ongoing health issues as well as family problems. Patient is seen today for follow up appointment  Interventions Strategy:  Supportive therapy,  cognitive behavioral therapy  Participation Level:   Active  Participation Quality:  Appropriate      Behavioral Observation:  Fairly groomed, Alert, tearful Current Psychosocial Factors: Patient reports increased conflict with son and his family. Patient now temporarily is residing in a rehabilitation facility.  Content of Session:   Reviewing symptoms, processing feelings, identifying support system, identifying coping statements  Current Status:   Patient reports increased anxiety, excessive worrying,  and sleep difficulty. She denies current suicidal ideations.  Patient Progress:    The patient reports having neck surgery on 10/21/2012 and having to return to the hospital for a brief admission due to complications. She reports having conflict with son and his family as his girlfriend did not want physical therapist and occupational therapist to provide services for patient at their home. Patient also reports that son's girlfriend did not stay at  the hospital when patient had surgery but did stay with her mother when she had surgery a few days later. She also reports that son's girlfriend did not fulfill CNA role and did not provide any assistance to patient when she returned home from the hospital. As a result, patient worked with her primary care physician for placement at rehabilitation facility. Patient expresses frustration and regret that she signed over her health power of attorney to her son. She plans to revoke this as soon as possible. She expresses sadness that son now has said that she can't have any contact with his girlfriend or patient's granddaughter. She reports son and  girlfriend are using granddaughter against her and fears she will not be allowed to see her for another 3 years which has happened in the past. Therapist works with patient to process her feelings, to identify other members in her support system and available resources for patient. She reports continued support from a friend, her brother,  and also receiving contact from her mother. Patient also is pleased with services from the rehabilitation facility.  Target Goals:   1. Improving mood as evidenced by resuming normal interest in activities and increased social involvement. 2. Increase self acceptance and improve self-esteem as evidenced by patient making positive statements about self. 3.  Improve coping and relaxation techniques. 4. Process and resolve grief and loss issues.   Last Reviewed:   04/06/2011  Goals Addressed Today:    Goal  3  Impression/Diagnosis:   The patient presents with a long-standing history of recurrent depression, mood swings, and a significant trauma history.  Diagnosis: Major Depressive Disorder  Diagnosis:  Axis I:  Bipolar Disorder           Axis II: Deferred

## 2012-11-05 ENCOUNTER — Ambulatory Visit: Payer: Medicare Other | Admitting: Family Medicine

## 2012-11-05 ENCOUNTER — Ambulatory Visit (HOSPITAL_COMMUNITY)
Admission: RE | Admit: 2012-11-05 | Discharge: 2012-11-05 | Disposition: A | Discharge status: Home / Self Care | Payer: Medicare Other | Source: Ambulatory Visit | Attending: Internal Medicine | Admitting: Internal Medicine

## 2012-11-05 DIAGNOSIS — K224 Dyskinesia of esophagus: Secondary | ICD-10-CM | POA: Insufficient documentation

## 2012-11-05 DIAGNOSIS — R1310 Dysphagia, unspecified: Secondary | ICD-10-CM | POA: Insufficient documentation

## 2012-11-06 ENCOUNTER — Ambulatory Visit (HOSPITAL_COMMUNITY): Payer: Self-pay | Admitting: Psychiatry

## 2012-11-10 NOTE — Progress Notes (Signed)
Referral & notes faxed to [facility] and someone from there will contact patient,

## 2012-11-11 ENCOUNTER — Ambulatory Visit (INDEPENDENT_AMBULATORY_CARE_PROVIDER_SITE_OTHER): Payer: Medicare Other | Admitting: Psychiatry

## 2012-11-11 DIAGNOSIS — F319 Bipolar disorder, unspecified: Secondary | ICD-10-CM

## 2012-11-11 NOTE — Progress Notes (Signed)
Patient:  Tonya Sullivan   DOB: Oct 27, 1955  MR Number: [id number]  Location: [facility]:  [address] [place] [place],  [place], [address]  Start: Tuesday 11/11/2012 2:00 PM  Tuesday 11/11/2012 2:50 PM End:   Provider/Observer:     Florencia Reasons, MSW, LCSW   Chief Complaint:      Chief Complaint  Patient presents with  . Anxiety  . Depression    Reason For Service:     The patient was referred for services by psychiatrist Dr. Lolly Mustache due to patient experiencing anxiety, stress, depression, and mood swings. She has a long-standing history of depression beginning in adolescence. Patient also has significant trauma history. Patient also experienced grief and loss issues due to to the death of her husband in Sep 26, 2010.  She continues to experience significant anxiety and stress related to ongoing health issues as well as family problems. Patient is seen today for follow up appointment  Interventions Strategy:  Supportive therapy,  cognitive behavioral therapy  Participation Level:   Active  Participation Quality:  Appropriate      Behavioral Observation:  well groomed, Alert, appropriate  Current Psychosocial Factors: Patient reports recent meeting with son and his girlfriend. Patient temporarily is residing in a rehabilitation facility.  Content of Session:   Reviewing symptoms, processing feelings, reinforcing patient's use of assertiveness skills and efforts to set and maintain boundaries in the relationship with her son and his girlfriend, identifying support system, identifying coping statements  Current Status:   Patient reports decreased anxiety, improved mood,decreased worry, and improved sleep pattern.   Patient Progress:    The patient reports improved mood and decreased stress since last session. She recently met with her son and his girlfriend along with the social worker at the rehabilitation facility. Patient reports being assertive with son and informing him  that she was not going to live where she did not feel wanted and that she was revoking her living will as she did not trust him and his girlfriend. Patient states feeling positive about her actions and no longer fearing son and girlfriend using visitation with her granddaughter against her as patient states realizing she has to take care of self. She has had contact with son, girlfriend, and her granddaughter since the meeting. Patient is very pleased with her placement, reports strong support from staff, and states her needs are met. She is regularly participating in occupational and physical therapy. She is scheduled to have cataract surgeries on November 25 and December 8. She also reports that she will be going to [facility] in [place] for further testing regarding poor muscle functioning in her throat which has affected patient's voice. Patient is pleased that mother and stepfather have tried to be supportive and visited patient last night. She's also had phone contact from her uncle as well as her brother who lives in [place] and plans to visit patient for Thanksgiving. She also reports continued support from a friend from her former apartment complex. Patient expresses some anxiety about future living arrangements when she is discharged from the rehabilitation facility but says social worker is working with her to find a place.  Target Goals:   1. Improving mood as evidenced by resuming normal interest in activities and increased social involvement. 2. Increase self acceptance and improve self-esteem as evidenced by patient making positive statements about self. 3.  Improve coping and relaxation techniques. 4. Process and resolve grief and loss issues.   Last Reviewed:  04/06/2011  Goals Addressed Today:    Goal  3  Impression/Diagnosis:   The patient presents with a long-standing history of recurrent depression, mood swings, and a significant trauma history.  Diagnosis: Major  Depressive Disorder  Diagnosis:  Axis I:  Bipolar Disorder           Axis II: Deferred

## 2012-11-11 NOTE — Patient Instructions (Signed)
Discussed orally 

## 2012-11-13 ENCOUNTER — Ambulatory Visit (HOSPITAL_COMMUNITY): Payer: Self-pay | Admitting: Psychiatry

## 2012-11-14 ENCOUNTER — Ambulatory Visit (HOSPITAL_COMMUNITY): Payer: Self-pay | Admitting: Psychiatry

## 2012-11-17 ENCOUNTER — Ambulatory Visit (HOSPITAL_COMMUNITY): Payer: Self-pay | Admitting: Psychiatry

## 2012-11-17 NOTE — Patient Instructions (Addendum)
Your procedure is scheduled on:  11/25/2012  Report to [facility] at     6:30 AM.  Call this number if you have problems the morning of surgery: [phone]   Remember:   Do not eat or drink :After Midnight.    Take these medicines the morning of surgery with A SIP OF WATER: Clonazepam, Losartan, Propranolol, Gabapentin, Oxybutynin and Naproxen. You may take your Percoet and Methacarbamol if needed. Use your Albuterol and Spiriva also.   Do not wear jewelry, make-up or nail polish.  Do not wear lotions, powders, or perfumes. You may wear deodorant.  Do not shave 48 hours prior to surgery.  Do not bring valuables to the hospital.  Contacts, dentures or bridgework may not be worn into surgery.  Patients discharged the day of surgery will not be allowed to drive home.  Name and phone number of your driver:    Please read over the following fact sheets that you were given: Pain Booklet, Surgical Site Infection Prevention, Anesthesia Post-op Instructions and Care and Recovery After Surgery  Cataract Surgery  A cataract is a clouding of the lens of the eye. When a lens becomes cloudy, vision is reduced based on the degree and nature of the clouding. Surgery may be needed to improve vision. Surgery removes the cloudy lens and usually replaces it with a substitute lens (intraocular lens, IOL). LET YOUR EYE DOCTOR KNOW ABOUT:  Allergies to food or medicine.   Medicines taken including herbs, eyedrops, over-the-counter medicines, and creams.   Use of steroids (by mouth or creams).   Previous problems with anesthetics or numbing medicine.   History of bleeding problems or blood clots.   Previous surgery.   Other health problems, including diabetes and kidney problems.   Possibility of pregnancy, if this applies.  RISKS AND COMPLICATIONS  Infection.   Inflammation of the eyeball (endophthalmitis) that can spread to both eyes (sympathetic ophthalmia).   Poor wound healing.   If an  IOL is inserted, it can later fall out of proper position. This is very uncommon.   Clouding of the part of your eye that holds an IOL in place. This is called an "after-cataract." These are uncommon, but easily treated.  BEFORE THE PROCEDURE  Do not eat or drink anything except small amounts of water for 8 to 12 before your surgery, or as directed by your caregiver.   Unless you are told otherwise, continue any eyedrops you have been prescribed.   Talk to your primary caregiver about all other medicines that you take (both prescription and non-prescription). In some cases, you may need to stop or change medicines near the time of your surgery. This is most important if you are taking blood-thinning medicine.Do not stop medicines unless you are told to do so.   Arrange for someone to drive you to and from the procedure.   Do not put contact lenses in either eye on the day of your surgery.  PROCEDURE There is more than one method for safely removing a cataract. Your doctor can explain the differences and help determine which is best for you. Phacoemulsification surgery is the most common form of cataract surgery.  An injection is given behind the eye or eyedrops are given to make this a painless procedure.   A small cut (incision) is made on the edge of the clear, dome-shaped surface that covers the front of the eye (cornea).   A tiny probe is painlessly inserted into the eye. This  device gives off ultrasound waves that soften and break up the cloudy center of the lens. This makes it easier for the cloudy lens to be removed by suction.   An IOL may be implanted.   The normal lens of the eye is covered by a clear capsule. Part of that capsule is intentionally left in the eye to support the IOL.   Your surgeon may or may not use stitches to close the incision.  There are other forms of cataract surgery that require a larger incision and stiches to close the eye. This approach is taken in  cases where the doctor feels that the cataract cannot be easily removed using phacoemulsification. AFTER THE PROCEDURE  When an IOL is implanted, it does not need care. It becomes a permanent part of your eye and cannot be seen or felt.   Your doctor will schedule follow-up exams to check on your progress.   Review your other medicines with your doctor to see which can be resumed after surgery.   Use eyedrops or take medicine as prescribed by your doctor.  Document Released: 12/07/2010 Document Reviewed: 12/04/2010 [facility] Patient Information 2012 [place], [place].  .Cataract Surgery Care After Refer to this sheet in the next few weeks. These instructions provide you with information on caring for yourself after your procedure. Your caregiver may also give you more specific instructions. Your treatment has been planned according to current medical practices, but problems sometimes occur. Call your caregiver if you have any problems or questions after your procedure.  HOME CARE INSTRUCTIONS   Avoid strenuous activities as directed by your caregiver.   Ask your caregiver when you can resume driving.   Use eyedrops or other medicines to help healing and control pressure inside your eye as directed by your caregiver.   Only take over-the-counter or prescription medicines for pain, discomfort, or fever as directed by your caregiver.   Do not to touch or rub your eyes.   You may be instructed to use a protective shield during the first few days and nights after surgery. If not, wear sunglasses to protect your eyes. This is to protect the eye from pressure or from being accidentally bumped.   Keep the area around your eye clean and dry. Avoid swimming or allowing water to hit you directly in the face while showering. Keep soap and shampoo out of your eyes.   Do not bend or lift heavy objects. Bending increases pressure in the eye. You can walk, climb stairs, and do light household chores.     Do not put a contact lens into the eye that had surgery until your caregiver says it is okay to do so.   Ask your doctor when you can return to work. This will depend on the kind of work that you do. If you work in a dusty environment, you may be advised to wear protective eyewear for a period of time.   Ask your caregiver when it will be safe to engage in sexual activity.   Continue with your regular eye exams as directed by your caregiver.  What to expect:  It is normal to feel itching and mild discomfort for a few days after cataract surgery. Some fluid discharge is also common, and your eye may be sensitive to light and touch.   After 1 to 2 days, even moderate discomfort should disappear. In most cases, healing will take about 6 weeks.   If you received an intraocular lens (IOL), you may  notice that colors are very bright or have a blue tinge. Also, if you have been in bright sunlight, everything may appear reddish for a few hours. If you see these color tinges, it is because your lens is clear and no longer cloudy. Within a few months after receiving an IOL, these extra colors should go away. When you have healed, you will probably need new glasses.  SEEK MEDICAL CARE IF:   You have increased bruising around your eye.   You have discomfort not helped by medicine.  SEEK IMMEDIATE MEDICAL CARE IF:   You have a fever.   You have a worsening or sudden vision loss.   You have redness, swelling, or increasing pain in the eye.   You have a thick discharge from the eye that had surgery.  MAKE SURE YOU:  Understand these instructions.   Will watch your condition.   Will get help right away if you are not doing well or get worse.  Document Released: 07/07/2004 Document Revised: 12/07/2010 Document Reviewed: 08/11/2010 [facility] Patient Information 2012 [place], [place].

## 2012-11-18 ENCOUNTER — Encounter (HOSPITAL_COMMUNITY): Payer: Self-pay

## 2012-11-18 ENCOUNTER — Encounter (HOSPITAL_COMMUNITY)
Admission: RE | Admit: 2012-11-18 | Discharge: 2012-11-18 | Disposition: A | Discharge status: Home / Self Care | Payer: Medicare Other | Source: Ambulatory Visit | Attending: Ophthalmology | Admitting: Ophthalmology

## 2012-11-18 HISTORY — DX: Primary adrenocortical insufficiency: E27.1

## 2012-11-19 ENCOUNTER — Encounter (HOSPITAL_COMMUNITY): Payer: Self-pay | Admitting: Pharmacy Technician

## 2012-11-24 MED ORDER — CYCLOPENTOLATE-PHENYLEPHRINE OP SOLN OPTIME - NO CHARGE
OPHTHALMIC | Status: AC
  Filled 2012-11-24: qty 2

## 2012-11-24 MED ORDER — TETRACAINE HCL 0.5 % OP SOLN
OPHTHALMIC | Status: AC
  Filled 2012-11-24: qty 2

## 2012-11-24 MED ORDER — KETOROLAC TROMETHAMINE 0.5 % OP SOLN
OPHTHALMIC | Status: AC
  Filled 2012-11-24: qty 5

## 2012-11-24 MED ORDER — PHENYLEPHRINE HCL 2.5 % OP SOLN
OPHTHALMIC | Status: AC
  Filled 2012-11-24: qty 15

## 2012-11-25 ENCOUNTER — Encounter (HOSPITAL_COMMUNITY): Payer: Medicare Other | Admitting: Anesthesiology

## 2012-11-25 ENCOUNTER — Ambulatory Visit (HOSPITAL_COMMUNITY): Payer: Medicare Other | Admitting: Anesthesiology

## 2012-11-25 ENCOUNTER — Encounter (HOSPITAL_COMMUNITY)
Admission: RE | Disposition: A | Discharge status: Nursing Home (Includes ICF) | Payer: Self-pay | Source: Ambulatory Visit | Attending: Ophthalmology

## 2012-11-25 ENCOUNTER — Encounter (HOSPITAL_COMMUNITY): Payer: Self-pay | Admitting: *Deleted

## 2012-11-25 ENCOUNTER — Ambulatory Visit (HOSPITAL_COMMUNITY)
Admission: RE | Admit: 2012-11-25 | Discharge: 2012-11-25 | Disposition: A | Discharge status: Nursing Home (Includes ICF) | Payer: Medicare Other | Source: Ambulatory Visit | Attending: Ophthalmology | Admitting: Ophthalmology

## 2012-11-25 DIAGNOSIS — J4489 Other specified chronic obstructive pulmonary disease: Secondary | ICD-10-CM | POA: Insufficient documentation

## 2012-11-25 DIAGNOSIS — J449 Chronic obstructive pulmonary disease, unspecified: Secondary | ICD-10-CM | POA: Insufficient documentation

## 2012-11-25 DIAGNOSIS — I10 Essential (primary) hypertension: Secondary | ICD-10-CM | POA: Insufficient documentation

## 2012-11-25 DIAGNOSIS — H2510 Age-related nuclear cataract, unspecified eye: Secondary | ICD-10-CM | POA: Insufficient documentation

## 2012-11-25 HISTORY — PX: CATARACT EXTRACTION W/PHACO: SHX586

## 2012-11-25 SURGERY — PHACOEMULSIFICATION, CATARACT, WITH IOL INSERTION
Anesthesia: Monitor Anesthesia Care | Site: Eye | Laterality: Right | Wound class: Clean

## 2012-11-25 MED ORDER — TETRACAINE 0.5 % OP SOLN OPTIME - NO CHARGE
OPHTHALMIC | Status: DC | PRN
  Administered 2012-11-25: 1 [drp] via OPHTHALMIC

## 2012-11-25 MED ORDER — CYCLOPENTOLATE-PHENYLEPHRINE 0.2-1 % OP SOLN
1.0000 [drp] | OPHTHALMIC | Status: AC
  Administered 2012-11-25 (×3): 1 [drp] via OPHTHALMIC

## 2012-11-25 MED ORDER — FENTANYL CITRATE 0.05 MG/ML IJ SOLN
25.0000 ug | INTRAMUSCULAR | Status: AC
  Administered 2012-11-25 (×2): 25 ug via INTRAVENOUS

## 2012-11-25 MED ORDER — METHYLPREDNISOLONE SODIUM SUCC 125 MG IJ SOLR
60.0000 mg | Freq: Once | INTRAMUSCULAR | Status: AC
  Administered 2012-11-25: 60.25 mg via INTRAVENOUS

## 2012-11-25 MED ORDER — LACTATED RINGERS IV SOLN
INTRAVENOUS | Status: DC
  Administered 2012-11-25: 08:00:00 via INTRAVENOUS

## 2012-11-25 MED ORDER — EPINEPHRINE HCL 1 MG/ML IJ SOLN
INTRAOCULAR | Status: DC | PRN
  Administered 2012-11-25: 08:00:00

## 2012-11-25 MED ORDER — PHENYLEPHRINE HCL 2.5 % OP SOLN
1.0000 [drp] | OPHTHALMIC | Status: AC
  Administered 2012-11-25 (×3): 1 [drp] via OPHTHALMIC

## 2012-11-25 MED ORDER — KETOROLAC TROMETHAMINE 0.5 % OP SOLN
1.0000 [drp] | OPHTHALMIC | Status: AC
  Administered 2012-11-25 (×2): 1 [drp] via OPHTHALMIC

## 2012-11-25 MED ORDER — BSS IO SOLN
INTRAOCULAR | Status: DC | PRN
  Administered 2012-11-25: 15 mL via INTRAOCULAR

## 2012-11-25 MED ORDER — TETRACAINE HCL 0.5 % OP SOLN
1.0000 [drp] | OPHTHALMIC | Status: AC
  Administered 2012-11-25 (×3): 1 [drp] via OPHTHALMIC

## 2012-11-25 MED ORDER — METHYLPREDNISOLONE SODIUM SUCC 125 MG IJ SOLR
INTRAMUSCULAR | Status: AC
  Filled 2012-11-25: qty 2

## 2012-11-25 MED ORDER — MIDAZOLAM HCL 2 MG/2ML IJ SOLN
INTRAMUSCULAR | Status: AC
  Filled 2012-11-25: qty 2

## 2012-11-25 MED ORDER — EPINEPHRINE HCL 1 MG/ML IJ SOLN
INTRAMUSCULAR | Status: AC
  Filled 2012-11-25: qty 1

## 2012-11-25 MED ORDER — KETOROLAC TROMETHAMINE 0.5 % OP SOLN
1.0000 [drp] | Freq: Once | OPHTHALMIC | Status: AC
  Administered 2012-11-25: 1 [drp] via OPHTHALMIC

## 2012-11-25 MED ORDER — FENTANYL CITRATE 0.05 MG/ML IJ SOLN
INTRAMUSCULAR | Status: AC
  Filled 2012-11-25: qty 2

## 2012-11-25 MED ORDER — MIDAZOLAM HCL 2 MG/2ML IJ SOLN
1.0000 mg | INTRAMUSCULAR | Status: DC | PRN
  Administered 2012-11-25: 2 mg via INTRAVENOUS

## 2012-11-25 MED ORDER — PROVISC 10 MG/ML IO SOLN
INTRAOCULAR | Status: DC | PRN
  Administered 2012-11-25: 0.85 mL via INTRAOCULAR

## 2012-11-25 SURGICAL SUPPLY — 24 items
CAPSULAR TENSION RING-AMO (OPHTHALMIC RELATED) IMPLANT
CLOTH BEACON ORANGE TIMEOUT ST (SAFETY) ×1 IMPLANT
EYE SHIELD UNIVERSAL CLEAR (GAUZE/BANDAGES/DRESSINGS) ×1 IMPLANT
GLOVE BIO SURGEON STRL SZ 6.5 (GLOVE) IMPLANT
GLOVE ECLIPSE 6.5 STRL STRAW (GLOVE) IMPLANT
GLOVE ECLIPSE 7.0 STRL STRAW (GLOVE) IMPLANT
GLOVE EXAM NITRILE LRG STRL (GLOVE) IMPLANT
GLOVE EXAM NITRILE MD LF STRL (GLOVE) IMPLANT
GLOVE INDICATOR 6.5 STRL GRN (GLOVE) ×1 IMPLANT
GLOVE INDICATOR 7.0 STRL GRN (GLOVE) ×1 IMPLANT
GLOVE SKINSENSE NS SZ6.5 (GLOVE)
GLOVE SKINSENSE STRL SZ6.5 (GLOVE) IMPLANT
HEALON 5 0.6 ML (INTRAOCULAR LENS) IMPLANT
KIT VITRECTOMY (OPHTHALMIC RELATED) IMPLANT
PAD ARMBOARD 7.5X6 YLW CONV (MISCELLANEOUS) ×1 IMPLANT
PROC W NO LENS (INTRAOCULAR LENS)
PROC W SPEC LENS (INTRAOCULAR LENS)
PROCESS W NO LENS (INTRAOCULAR LENS) IMPLANT
PROCESS W SPEC LENS (INTRAOCULAR LENS) IMPLANT
RING MALYGIN (MISCELLANEOUS) IMPLANT
SIGHTPATH CAT PROC W REG LENS (Ophthalmic Related) ×2 IMPLANT
TAPE PAPER 3X10 WHT MICROPORE (GAUZE/BANDAGES/DRESSINGS) ×1 IMPLANT
VISCOELASTIC ADDITIONAL (OPHTHALMIC RELATED) IMPLANT
WATER STERILE IRR 250ML POUR (IV SOLUTION) ×1 IMPLANT

## 2012-11-25 NOTE — Anesthesia Postprocedure Evaluation (Signed)
  Anesthesia Post-op Note  Patient: Tonya Sullivan  Procedure(s) Performed: Procedure(s) with comments: CATARACT EXTRACTION PHACO AND INTRAOCULAR LENS PLACEMENT (IOC) (Right) - CDE:10.06  Patient Location: Short Stay  Anesthesia Type:MAC  Level of Consciousness: awake, alert , oriented and patient cooperative  Airway and Oxygen Therapy: Patient Spontanous Breathing  Post-op Pain: none  Post-op Assessment: Post-op Vital signs reviewed, Patient's Cardiovascular Status Stable, Respiratory Function Stable, Patent Airway, No signs of Nausea or vomiting and Pain level controlled  Post-op Vital Signs: Reviewed and stable  Complications: No apparent anesthesia complications

## 2012-11-25 NOTE — H&P (Signed)
The patient was re examined and there is no change in the patients condition since the original H and P. 

## 2012-11-25 NOTE — Op Note (Signed)
Patient brought to the operating room and prepped and draped in the usual manner.  Lid speculum inserted in right eye.  Stab incision made at the twelve o'clock position.  Provisc instilled in the anterior chamber.   A 2.4 mm. Stab incision was made temporally.  An anterior capsulotomy was done with a bent 25 gauge needle.  The nucleus was hydrodissected.  The Phaco tip was inserted in the anterior chamber and the nucleus was emulsified.  CDE was 10.06.  The cortical material was then removed with the I and A tip.  Posterior capsule was the polished.  The anterior chamber was deepened with Provisc.  A 20.0 Diopter Rayner 570C IOL was then inserted in the capsular bag.  Provisc was then removed with the I and A tip.  The wound was then hydrated.  Patient sent to the Recovery Room in good condition with follow up in my office.  Preoperative Diagnosis:  Nuclear Cataract OD Postoperative Diagnosis:  Same Procedure name: Kelman Phacoemulsification OD with IOL

## 2012-11-25 NOTE — Transfer of Care (Signed)
Immediate Anesthesia Transfer of Care Note  Patient: Tonya Sullivan  Procedure(s) Performed: Procedure(s) with comments: CATARACT EXTRACTION PHACO AND INTRAOCULAR LENS PLACEMENT (IOC) (Right) - CDE:10.06  Patient Location: Short Stay  Anesthesia Type:MAC  Level of Consciousness: awake, alert , oriented and patient cooperative  Airway & Oxygen Therapy: Patient Spontanous Breathing  Post-op Assessment: Report given to PACU RN, Post -op Vital signs reviewed and stable and Patient moving all extremities  Post vital signs: Reviewed and stable  Complications: No apparent anesthesia complications

## 2012-11-25 NOTE — Anesthesia Preprocedure Evaluation (Addendum)
Anesthesia Evaluation  Patient identified by MRN, date of birth, ID band Patient awake    Reviewed: Allergy & Precautions, H&P , NPO status , Patient's Chart, lab work & pertinent test results  Airway Mallampati: III TM Distance: >3 FB Neck ROM: Limited    Dental  (+) Edentulous Upper and Edentulous Lower   Pulmonary COPDCurrent Smoker,  breath sounds clear to auscultation        Cardiovascular hypertension, + Peripheral Vascular Disease Rhythm:Regular Rate:Normal     Neuro/Psych PSYCHIATRIC DISORDERS Anxiety Depression Bipolar Disorder Anterior fusion TIA   GI/Hepatic GERD-  Controlled,  Endo/Other  Addison's Dx, daily prednisone - didn't take today  Renal/GU      Musculoskeletal   Abdominal   Peds  Hematology   Anesthesia Other Findings   Reproductive/Obstetrics                           Anesthesia Physical Anesthesia Plan  ASA: III  Anesthesia Plan: MAC   Post-op Pain Management:    Induction: Intravenous  Airway Management Planned: Nasal Cannula  Additional Equipment:   Intra-op Plan:   Post-operative Plan:   Informed Consent: I have reviewed the patients History and Physical, chart, labs and discussed the procedure including the risks, benefits and alternatives for the proposed anesthesia with the patient or authorized representative who has indicated his/her understanding and acceptance.     Plan Discussed with:   Anesthesia Plan Comments: (preop 60mg solumedrol)        Anesthesia Quick Evaluation  

## 2012-11-26 ENCOUNTER — Ambulatory Visit (INDEPENDENT_AMBULATORY_CARE_PROVIDER_SITE_OTHER): Payer: Medicare Other | Admitting: Psychiatry

## 2012-11-26 ENCOUNTER — Encounter (HOSPITAL_COMMUNITY): Payer: Self-pay | Admitting: Ophthalmology

## 2012-11-26 VITALS — BP 110/70 | Ht 67.0 in | Wt 203.0 lb

## 2012-11-26 DIAGNOSIS — F319 Bipolar disorder, unspecified: Secondary | ICD-10-CM

## 2012-11-26 DIAGNOSIS — F411 Generalized anxiety disorder: Secondary | ICD-10-CM

## 2012-11-26 DIAGNOSIS — F332 Major depressive disorder, recurrent severe without psychotic features: Secondary | ICD-10-CM
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
## 2012-12-01 ENCOUNTER — Encounter (HOSPITAL_COMMUNITY): Payer: Self-pay | Admitting: Pharmacy Technician

## 2012-12-01 MED ORDER — FENTANYL CITRATE 0.05 MG/ML IJ SOLN
25.0000 ug | INTRAMUSCULAR | Status: DC | PRN
  Filled 2012-12-01: qty 2

## 2012-12-01 MED ORDER — ONDANSETRON HCL 4 MG/2ML IJ SOLN: 4.0000 mg | Freq: Once | INTRAMUSCULAR | Status: AC | PRN

## 2012-12-02 ENCOUNTER — Encounter (HOSPITAL_COMMUNITY): Payer: Self-pay

## 2012-12-02 ENCOUNTER — Encounter (HOSPITAL_COMMUNITY): Payer: Medicare Other | Attending: Family Medicine

## 2012-12-04 ENCOUNTER — Ambulatory Visit (INDEPENDENT_AMBULATORY_CARE_PROVIDER_SITE_OTHER): Payer: Medicare Other | Admitting: Psychiatry

## 2012-12-04 DIAGNOSIS — F319 Bipolar disorder, unspecified: Secondary | ICD-10-CM
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
## 2012-12-04 NOTE — Patient Instructions (Signed)
Discussed orally 

## 2012-12-09 ENCOUNTER — Ambulatory Visit (HOSPITAL_COMMUNITY): Payer: Medicare Other | Admitting: Anesthesiology

## 2012-12-09 ENCOUNTER — Encounter (HOSPITAL_COMMUNITY): Payer: Self-pay | Admitting: Anesthesiology

## 2012-12-09 ENCOUNTER — Encounter (HOSPITAL_COMMUNITY): Payer: Medicare Other | Admitting: Anesthesiology

## 2012-12-09 ENCOUNTER — Encounter (HOSPITAL_COMMUNITY)
Admission: RE | Disposition: A | Discharge status: Home / Self Care | Payer: Self-pay | Source: Ambulatory Visit | Attending: Ophthalmology

## 2012-12-09 ENCOUNTER — Ambulatory Visit (HOSPITAL_COMMUNITY)
Admission: RE | Admit: 2012-12-09 | Discharge: 2012-12-09 | Disposition: A | Discharge status: Home / Self Care | Payer: Medicare Other | Source: Ambulatory Visit | Attending: Ophthalmology | Admitting: Ophthalmology

## 2012-12-09 DIAGNOSIS — J4489 Other specified chronic obstructive pulmonary disease: Secondary | ICD-10-CM | POA: Insufficient documentation

## 2012-12-09 DIAGNOSIS — I10 Essential (primary) hypertension: Secondary | ICD-10-CM | POA: Insufficient documentation

## 2012-12-09 DIAGNOSIS — J449 Chronic obstructive pulmonary disease, unspecified: Secondary | ICD-10-CM | POA: Insufficient documentation

## 2012-12-09 DIAGNOSIS — H2510 Age-related nuclear cataract, unspecified eye: Secondary | ICD-10-CM | POA: Insufficient documentation

## 2012-12-09 DIAGNOSIS — Z79899 Other long term (current) drug therapy: Secondary | ICD-10-CM | POA: Insufficient documentation

## 2012-12-09 HISTORY — PX: CATARACT EXTRACTION W/PHACO: SHX586

## 2012-12-09 SURGERY — PHACOEMULSIFICATION, CATARACT, WITH IOL INSERTION
Anesthesia: Monitor Anesthesia Care | Site: Eye | Laterality: Left

## 2012-12-09 MED ORDER — PHENYLEPHRINE HCL 2.5 % OP SOLN
1.0000 [drp] | OPHTHALMIC | Status: AC
  Administered 2012-12-09 (×3): 1 [drp] via OPHTHALMIC

## 2012-12-09 MED ORDER — FENTANYL CITRATE 0.05 MG/ML IJ SOLN: 25.0000 ug | INTRAMUSCULAR | Status: DC | PRN

## 2012-12-09 MED ORDER — LACTATED RINGERS IV SOLN
INTRAVENOUS | Status: DC
  Administered 2012-12-09: 07:00:00 via INTRAVENOUS

## 2012-12-09 MED ORDER — CARBACHOL 0.01 % IO SOLN
INTRAOCULAR | Status: AC
  Filled 2012-12-09: qty 1.5

## 2012-12-09 MED ORDER — TETRACAINE 0.5 % OP SOLN OPTIME - NO CHARGE
OPHTHALMIC | Status: DC | PRN
  Administered 2012-12-09: 2 [drp] via OPHTHALMIC

## 2012-12-09 MED ORDER — TETRACAINE HCL 0.5 % OP SOLN
1.0000 [drp] | OPHTHALMIC | Status: AC
  Administered 2012-12-09 (×3): 1 [drp] via OPHTHALMIC

## 2012-12-09 MED ORDER — CYCLOPENTOLATE-PHENYLEPHRINE 0.2-1 % OP SOLN
1.0000 [drp] | OPHTHALMIC | Status: AC
  Administered 2012-12-09 (×3): 1 [drp] via OPHTHALMIC

## 2012-12-09 MED ORDER — CYCLOPENTOLATE-PHENYLEPHRINE OP SOLN OPTIME - NO CHARGE
OPHTHALMIC | Status: AC
  Filled 2012-12-09: qty 2

## 2012-12-09 MED ORDER — EPINEPHRINE HCL 1 MG/ML IJ SOLN
INTRAOCULAR | Status: DC | PRN
  Administered 2012-12-09: 08:00:00

## 2012-12-09 MED ORDER — KETOROLAC TROMETHAMINE 0.5 % OP SOLN
OPHTHALMIC | Status: AC
  Filled 2012-12-09: qty 5

## 2012-12-09 MED ORDER — ONDANSETRON HCL 4 MG/2ML IJ SOLN: 4.0000 mg | Freq: Once | INTRAMUSCULAR | Status: DC | PRN

## 2012-12-09 MED ORDER — MIDAZOLAM HCL 2 MG/2ML IJ SOLN
INTRAMUSCULAR | Status: AC
  Filled 2012-12-09: qty 2

## 2012-12-09 MED ORDER — CYCLOPENTOLATE-PHENYLEPHRINE 0.2-1 % OP SOLN
1.0000 [drp] | OPHTHALMIC | Status: AC
  Administered 2012-12-09: 1 [drp] via OPHTHALMIC

## 2012-12-09 MED ORDER — TETRACAINE HCL 0.5 % OP SOLN
OPHTHALMIC | Status: AC
  Filled 2012-12-09: qty 2

## 2012-12-09 MED ORDER — MIDAZOLAM HCL 2 MG/2ML IJ SOLN
1.0000 mg | INTRAMUSCULAR | Status: DC | PRN
  Administered 2012-12-09: 2 mg via INTRAVENOUS

## 2012-12-09 MED ORDER — PHENYLEPHRINE HCL 2.5 % OP SOLN
OPHTHALMIC | Status: AC
  Filled 2012-12-09: qty 15

## 2012-12-09 MED ORDER — KETOROLAC TROMETHAMINE 0.5 % OP SOLN
1.0000 [drp] | OPHTHALMIC | Status: AC
  Administered 2012-12-09 (×3): 1 [drp] via OPHTHALMIC

## 2012-12-09 MED ORDER — FENTANYL CITRATE 0.05 MG/ML IJ SOLN
25.0000 ug | INTRAMUSCULAR | Status: AC
  Administered 2012-12-09: 25 ug via INTRAVENOUS

## 2012-12-09 MED ORDER — LIDOCAINE HCL (PF) 1 % IJ SOLN
INTRAMUSCULAR | Status: AC
  Filled 2012-12-09: qty 2

## 2012-12-09 MED ORDER — PROVISC 10 MG/ML IO SOLN
INTRAOCULAR | Status: DC | PRN
  Administered 2012-12-09: .55 mL via INTRAOCULAR

## 2012-12-09 MED ORDER — BSS IO SOLN
INTRAOCULAR | Status: DC | PRN
  Administered 2012-12-09: 15 mL via INTRAOCULAR

## 2012-12-09 MED ORDER — EPINEPHRINE HCL 1 MG/ML IJ SOLN
INTRAMUSCULAR | Status: AC
  Filled 2012-12-09: qty 1

## 2012-12-09 SURGICAL SUPPLY — 24 items
CAPSULAR TENSION RING-AMO (OPHTHALMIC RELATED) IMPLANT
CLOTH BEACON ORANGE TIMEOUT ST (SAFETY) ×1 IMPLANT
EYE SHIELD UNIVERSAL CLEAR (GAUZE/BANDAGES/DRESSINGS) ×1 IMPLANT
GLOVE BIO SURGEON STRL SZ 6.5 (GLOVE) ×1 IMPLANT
GLOVE ECLIPSE 6.5 STRL STRAW (GLOVE) IMPLANT
GLOVE ECLIPSE 7.0 STRL STRAW (GLOVE) IMPLANT
GLOVE EXAM NITRILE LRG STRL (GLOVE) ×1 IMPLANT
GLOVE EXAM NITRILE MD LF STRL (GLOVE) IMPLANT
GLOVE SKINSENSE NS SZ6.5 (GLOVE)
GLOVE SKINSENSE STRL SZ6.5 (GLOVE) IMPLANT
HEALON 5 0.6 ML (INTRAOCULAR LENS) IMPLANT
KIT VITRECTOMY (OPHTHALMIC RELATED) IMPLANT
PAD ARMBOARD 7.5X6 YLW CONV (MISCELLANEOUS) ×1 IMPLANT
PROC W NO LENS (INTRAOCULAR LENS)
PROC W SPEC LENS (INTRAOCULAR LENS)
PROCESS W NO LENS (INTRAOCULAR LENS) IMPLANT
PROCESS W SPEC LENS (INTRAOCULAR LENS) IMPLANT
RING MALYGIN (MISCELLANEOUS) IMPLANT
SIGHTPATH CAT PROC W REG LENS (Ophthalmic Related) ×2 IMPLANT
TAPE PAPER MEDFIX 1IN X 10YD (GAUZE/BANDAGES/DRESSINGS) ×1 IMPLANT
TAPE SURG TRANSPORE 1 IN (GAUZE/BANDAGES/DRESSINGS) IMPLANT
TAPE SURGICAL TRANSPORE 1 IN (GAUZE/BANDAGES/DRESSINGS) ×1
VISCOELASTIC ADDITIONAL (OPHTHALMIC RELATED) IMPLANT
WATER STERILE IRR 250ML POUR (IV SOLUTION) ×1 IMPLANT

## 2012-12-09 NOTE — Anesthesia Postprocedure Evaluation (Signed)
  Anesthesia Post-op Note  Patient: Tonya Sullivan  Procedure(s) Performed: Procedure(s) with comments: CATARACT EXTRACTION PHACO AND INTRAOCULAR LENS PLACEMENT (IOC) (Left) - CDE:5.06  Patient Location: Short Stay  Anesthesia Type:MAC  Level of Consciousness: awake, alert  and oriented  Airway and Oxygen Therapy: Patient Spontanous Breathing  Post-op Pain: none  Post-op Assessment: Post-op Vital signs reviewed, Patient's Cardiovascular Status Stable, Respiratory Function Stable, Patent Airway and No signs of Nausea or vomiting  Post-op Vital Signs: Reviewed and stable  Complications: No apparent anesthesia complications

## 2012-12-09 NOTE — Op Note (Signed)
Patient brought to the operating room and prepped and draped in the usual manner.  Lid speculum inserted in left eye.  Stab incision made at the twelve o'clock position.  Provisc instilled in the anterior chamber.   A 2.4 mm. Stab incision was made temporally.  An anterior capsulotomy was done with a bent 25 gauge needle.  The nucleus was hydrodissected.  The Phaco tip was inserted in the anterior chamber and the nucleus was emulsified.  CDE was 5.06.  The cortical material was then removed with the I and A tip.  Posterior capsule was the polished.  The anterior chamber was deepened with Provisc.  A 20.0 Diopter Rayner 570C IOL was then inserted in the capsular bag.  Provisc was then removed with the I and A tip.  The wound was then hydrated.  Patient sent to the Recovery Room in good condition with follow up in my office.  Preoperative Diagnosis:  Nuclear Cataract OD Postoperative Diagnosis:  Same Procedure name: Kelman Phacoemulsification OD with IOL

## 2012-12-09 NOTE — H&P (Signed)
The patient was re examined and there is no change in the patients condition since the original H and P. 

## 2012-12-09 NOTE — Transfer of Care (Signed)
Immediate Anesthesia Transfer of Care Note  Patient: Tonya Sullivan  Procedure(s) Performed: Procedure(s) with comments: CATARACT EXTRACTION PHACO AND INTRAOCULAR LENS PLACEMENT (IOC) (Left) - CDE:5.06  Patient Location: PACU and Short Stay  Anesthesia Type:MAC  Level of Consciousness: awake, alert  and oriented  Airway & Oxygen Therapy: Patient Spontanous Breathing  Post-op Assessment: Report given to PACU RN  Post vital signs: Reviewed  Complications: No apparent anesthesia complications

## 2012-12-09 NOTE — Anesthesia Preprocedure Evaluation (Signed)
Anesthesia Evaluation  Patient identified by MRN, date of birth, ID band Patient awake    Reviewed: Allergy & Precautions, H&P , NPO status , Patient's Chart, lab work & pertinent test results  Airway Mallampati: III TM Distance: >3 FB Neck ROM: Limited    Dental  (+) Edentulous Upper and Edentulous Lower   Pulmonary COPDCurrent Smoker,  breath sounds clear to auscultation        Cardiovascular hypertension, + Peripheral Vascular Disease Rhythm:Regular Rate:Normal     Neuro/Psych PSYCHIATRIC DISORDERS Anxiety Depression Bipolar Disorder Anterior fusion TIA   GI/Hepatic GERD-  Controlled,  Endo/Other  Addison's Dx, daily prednisone - didn't take today  Renal/GU      Musculoskeletal   Abdominal   Peds  Hematology   Anesthesia Other Findings   Reproductive/Obstetrics                           Anesthesia Physical Anesthesia Plan  ASA: III  Anesthesia Plan: MAC   Post-op Pain Management:    Induction: Intravenous  Airway Management Planned: Nasal Cannula  Additional Equipment:   Intra-op Plan:   Post-operative Plan:   Informed Consent: I have reviewed the patients History and Physical, chart, labs and discussed the procedure including the risks, benefits and alternatives for the proposed anesthesia with the patient or authorized representative who has indicated his/her understanding and acceptance.     Plan Discussed with:   Anesthesia Plan Comments: (preop 60mg  solumedrol)        Anesthesia Quick Evaluation

## 2012-12-10 ENCOUNTER — Encounter (HOSPITAL_COMMUNITY): Payer: Self-pay | Admitting: Ophthalmology

## 2012-12-12 ENCOUNTER — Ambulatory Visit (INDEPENDENT_AMBULATORY_CARE_PROVIDER_SITE_OTHER): Payer: Medicare Other | Admitting: Psychiatry

## 2012-12-12 ENCOUNTER — Encounter (HOSPITAL_COMMUNITY): Payer: Self-pay | Admitting: Psychiatry

## 2012-12-12 VITALS — BP 130/90 | Ht 67.0 in | Wt 201.0 lb

## 2012-12-12 DIAGNOSIS — F319 Bipolar disorder, unspecified: Secondary | ICD-10-CM

## 2012-12-12 DIAGNOSIS — F411 Generalized anxiety disorder: Secondary | ICD-10-CM

## 2012-12-12 DIAGNOSIS — F332 Major depressive disorder, recurrent severe without psychotic features: Secondary | ICD-10-CM

## 2012-12-12 MED ORDER — ESCITALOPRAM OXALATE 20 MG PO TABS: 20.0000 mg | ORAL_TABLET | ORAL | Status: DC
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
## 2012-12-17 ENCOUNTER — Telehealth (HOSPITAL_COMMUNITY): Payer: Self-pay | Admitting: Psychiatry

## 2012-12-18 ENCOUNTER — Telehealth (HOSPITAL_COMMUNITY): Payer: Self-pay | Admitting: Psychiatry

## 2012-12-18 ENCOUNTER — Ambulatory Visit (HOSPITAL_COMMUNITY): Payer: Self-pay | Admitting: Psychiatry

## 2012-12-18 NOTE — Telephone Encounter (Signed)
Therapist contact and left patient a message reminding her that she has an appointment with therapist on 12/22/2012 and to also offer patient the opportunity to come for an appointment today at 4:00. Therapist requested patient call office if she wants the 4:00 appointment on 12/18/2012.

## 2012-12-18 NOTE — Telephone Encounter (Signed)
Therapist returned patient's call. Patient reported being angry that her son and his girlfriend have requested that she remove all of her items from their home by the end of the month. She reports having a friend who plans to help her remove items but having difficulty working around son's girlfriend's schedule as she wants patient there only when  patient's son is at home. Patient informed therapist that her phone is dying and then call was abruptly disconnected. Therapist called the patient again and left message that therapist was trying to contact patient and to remind patient that she has appointment with therapist on 12/22/2012.

## 2012-12-19 ENCOUNTER — Ambulatory Visit (HOSPITAL_COMMUNITY): Payer: Self-pay | Admitting: Psychiatry

## 2012-12-19 ENCOUNTER — Ambulatory Visit (INDEPENDENT_AMBULATORY_CARE_PROVIDER_SITE_OTHER): Payer: Medicare Other | Admitting: Psychiatry

## 2012-12-19 DIAGNOSIS — F319 Bipolar disorder, unspecified: Secondary | ICD-10-CM

## 2012-12-19 NOTE — Patient Instructions (Signed)
Discussed orally 

## 2012-12-19 NOTE — Progress Notes (Signed)
Patient:  Tonya Sullivan   DOB: [birth year]  MR Number: [id number]  [facility]: [facility]:  [address] [place],  [place], [address]  Start: Friday 12/19/2012 3:45 PM  Friday 12/19/2012 4:15 PM End:   Provider/Observer:     Florencia Reasons, MSW, LCSW   Chief Complaint:      Chief Complaint  Patient presents with  . Stress  . Anxiety    Reason For Service:     The patient was referred for services by psychiatrist Dr. Lolly Mustache due to patient experiencing anxiety, stress, depression, and mood swings. She has a long-standing history of depression beginning in adolescence. Patient also has significant trauma history. Patient also experienced grief and loss issues due to to the death of her husband in 2010-10-08.  She continues to experience significant anxiety and stress related to ongoing health issues as well as family problems. Patient is seen today due to increased stress.  Interventions Strategy:  Supportive therapy,  cognitive behavioral therapy  Participation Level:   Active  Participation Quality:  Appropriate      Behavioral Observation:  well groomed, Alert, appropriate  Current Psychosocial Factors: Patient's son and his girlfriend have asked patient to get the rest of her belongings from their home by the end of this week.  Content of Session:   Reviewing symptoms, processing feelings, identifying support system and ways to use,   Current Status:   Patient reports increased anxiety, worry, sadness, and fleeting suicidal ideations.  Patient Progress:    The patient reports increased stress as son and his girlfriend have requested patient remove the rest of her belongings from their home by the end of this week. However, patient has limited resources to try to do this. She expresses frustration, disappointment, anger, and hurt. She admits having fleeting suicidal ideations of cutting herself with a pocket knife last night. She denies having any since that time. She  denies current suicidal ideations and states she will not harm self. She voluntarily gives knife to therapist. Patient expresses frustration with self for giving up her apartment but states she is trying to forgive self. Therapist works with patient to process feelings and to identify strengths patient has used in previous adversities. Therapist also works with patient to identify ways to use her support system including staff at [facility], her mother, her brother, and her friends. She is disappointed  that she is not able to move into her own apartment by the end of the month but is glad that she can remain at [facility] and has good support from staff. Patient also is relying on her spirituality and states she knows God will open another door. Therapist also works with patient to identify coping statements. Patient is scheduled to return for an appointment with therapist on Monday, 12/22/2012. Patient agrees to call 911 or have someone take her to the Emergency Room should symptoms worsen.   Target Goals:   1. Improving mood as evidenced by resuming normal interest in activities and increased social involvement. 2. Increase self acceptance and improve self-esteem as evidenced by patient making positive statements about self. 3.  Improve coping and relaxation techniques. 4. Process and resolve grief and loss issues.   Last Reviewed:   04/06/2011  Goals Addressed Today:    Goal  3  Impression/Diagnosis:   The patient presents with a long-standing history of recurrent depression, mood swings, and a significant trauma history.  Diagnosis: Bipolar Disorder  Diagnosis:  Axis I:  Bipolar Disorder  Axis II: Deferred

## 2012-12-22 ENCOUNTER — Ambulatory Visit (HOSPITAL_COMMUNITY): Payer: Self-pay | Admitting: Psychiatry

## 2012-12-22 ENCOUNTER — Telehealth (HOSPITAL_COMMUNITY): Payer: Self-pay | Admitting: Psychiatry

## 2012-12-22 NOTE — Telephone Encounter (Signed)
Patient called to inform therapist she missed appointment today as she was completing paperwork to obtain an apartment. She also informs therapist sh moved her belongings from her son's home yesterday. She reports having fleeting suicidal ideations last Friday night but talked with a nurse at [facility] who helped her cope. She denies having any suicidal ideations since that time and denies current suicidal ideations. Patient reports feeling much better and more hopeful now that she has gotten her belongings and now has an apartment. Patient agrees to schedule an appointment to see therapist.

## 2012-12-23 ENCOUNTER — Encounter (HOSPITAL_COMMUNITY): Payer: Self-pay | Admitting: Emergency Medicine

## 2012-12-23 ENCOUNTER — Emergency Department (HOSPITAL_COMMUNITY)
Admission: EM | Admit: 2012-12-23 | Discharge: 2012-12-23 | Disposition: A | Discharge status: Home / Self Care | Payer: Medicare Other | Attending: Emergency Medicine | Admitting: Emergency Medicine

## 2012-12-23 ENCOUNTER — Telehealth (INDEPENDENT_AMBULATORY_CARE_PROVIDER_SITE_OTHER): Payer: Self-pay | Admitting: *Deleted

## 2012-12-23 DIAGNOSIS — F17200 Nicotine dependence, unspecified, uncomplicated: Secondary | ICD-10-CM | POA: Insufficient documentation

## 2012-12-23 DIAGNOSIS — F319 Bipolar disorder, unspecified: Secondary | ICD-10-CM | POA: Insufficient documentation

## 2012-12-23 DIAGNOSIS — G8929 Other chronic pain: Secondary | ICD-10-CM | POA: Insufficient documentation

## 2012-12-23 DIAGNOSIS — M51379 Other intervertebral disc degeneration, lumbosacral region without mention of lumbar back pain or lower extremity pain: Secondary | ICD-10-CM | POA: Insufficient documentation

## 2012-12-23 DIAGNOSIS — Z85820 Personal history of malignant melanoma of skin: Secondary | ICD-10-CM | POA: Insufficient documentation

## 2012-12-23 DIAGNOSIS — M545 Low back pain, unspecified: Secondary | ICD-10-CM | POA: Insufficient documentation

## 2012-12-23 DIAGNOSIS — J4489 Other specified chronic obstructive pulmonary disease: Secondary | ICD-10-CM | POA: Insufficient documentation

## 2012-12-23 DIAGNOSIS — J449 Chronic obstructive pulmonary disease, unspecified: Secondary | ICD-10-CM | POA: Insufficient documentation

## 2012-12-23 DIAGNOSIS — K219 Gastro-esophageal reflux disease without esophagitis: Secondary | ICD-10-CM | POA: Insufficient documentation

## 2012-12-23 DIAGNOSIS — Z8673 Personal history of transient ischemic attack (TIA), and cerebral infarction without residual deficits: Secondary | ICD-10-CM | POA: Insufficient documentation

## 2012-12-23 DIAGNOSIS — E785 Hyperlipidemia, unspecified: Secondary | ICD-10-CM | POA: Insufficient documentation

## 2012-12-23 DIAGNOSIS — M62838 Other muscle spasm: Secondary | ICD-10-CM | POA: Insufficient documentation

## 2012-12-23 DIAGNOSIS — Z8701 Personal history of pneumonia (recurrent): Secondary | ICD-10-CM | POA: Insufficient documentation

## 2012-12-23 DIAGNOSIS — I10 Essential (primary) hypertension: Secondary | ICD-10-CM | POA: Insufficient documentation

## 2012-12-23 DIAGNOSIS — IMO0002 Reserved for concepts with insufficient information to code with codable children: Secondary | ICD-10-CM | POA: Insufficient documentation

## 2012-12-23 DIAGNOSIS — Z79899 Other long term (current) drug therapy: Secondary | ICD-10-CM | POA: Insufficient documentation

## 2012-12-23 DIAGNOSIS — M5137 Other intervertebral disc degeneration, lumbosacral region: Secondary | ICD-10-CM | POA: Insufficient documentation

## 2012-12-23 MED ORDER — KETOROLAC TROMETHAMINE 60 MG/2ML IM SOLN
60.0000 mg | Freq: Once | INTRAMUSCULAR | Status: AC
  Administered 2012-12-23: 60 mg via INTRAMUSCULAR
  Filled 2012-12-23: qty 2

## 2012-12-23 MED ORDER — HYDROMORPHONE HCL PF 1 MG/ML IJ SOLN
1.0000 mg | Freq: Once | INTRAMUSCULAR | Status: AC
  Administered 2012-12-23: 1 mg via INTRAMUSCULAR
  Filled 2012-12-23: qty 1

## 2012-12-23 NOTE — Telephone Encounter (Signed)
Per Dr.Rehman call and see what happen with her appointment in [facility]. Results of barium studies reviewed with patient. No stricture or structure abnormality. 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 Patient advised me slowly. Will schedule esophageal manometry and impedance study at [facility]. Ann, please schedule test for first or second week in December 2014 Report to PCP

## 2012-12-23 NOTE — ED Notes (Signed)
Pt lives at [facility] for rehab, will call them for pickup

## 2012-12-23 NOTE — ED Provider Notes (Signed)
CSN: [id number]     Arrival date & time 12/23/12  2202 History  This chart was scribed for Tonya Co, MD by Dorothey Baseman, ED Scribe. This patient was seen in room APA11/APA11 and the patient's care was started at 10:26 PM.    Chief Complaint  Patient presents with  . Back Pain   The history is provided by the patient. No language interpreter was used.   HPI Comments: Kamaya A Mano is a 57 y.o. female with a history of chronic back pain, degenerative disc disease, and lumbar spinal stenosis who presents to the Emergency Department complaining of a constant, severe pain to the lower back that she states feel similar to her chronic pain, but has been progressively worsening. She reports some associated leg weakness and that her symptoms are exacerbated with walking. She reports taking oxycodone 10-325 mg every 6 hours for her chronic pain (last dose around 1.5 hours ago), which she states has not been effective at relieving her current symptoms. She denies any recent heavy lifting. Patient reports that she walks a lot, but has not done so any more than usual. She denies fever, chills. Patient denies IV drug use. Patient also has a history of HTN, hyperlipidemia, and TIA.   Past Medical History  Diagnosis Date  . COPD (chronic obstructive pulmonary disease)   . Depression   . Chronic back pain     DDD, disc bulge, radiculopathy, spinal stenosis  . Hyperlipidemia   . Anxiety   . Bipolar disorder, unspecified   . Carotid artery calcification   . DDD (degenerative disc disease), lumbosacral   . Spinal stenosis, lumbar   . Melanoma     removed 1 week ago at Dr Laurena Slimmer office  . Thyroid disease   . Autoimmune thyroiditis   . Hypertension   . Dysrhythmia     palpitations  . TIA (transient ischemic attack)     august 2014  . Pneumonia     hx of   . GERD (gastroesophageal reflux disease)   . H/O dizziness   . Neuropathic pain   . Adrenal gland dysfunction     Addison's disease  .  Addison's disease    Past Surgical History  Procedure Laterality Date  . Appendectomy    . Tonsillectomy    . Cholecystectomy    . Lump left breast      removed- benign  . Abdominal hysterectomy      tubal pregnancy  . Inguinal hernia repair    . Stomach surgery      ?holes in esophgous  . Ectopic pregnancy surgery    . Back surgery      x 5;1984;1989;;1999;2000;2010  . Colonoscopy  01/25/2012    Procedure: COLONOSCOPY;  Surgeon: Malissa Hippo, MD;  Location: [facility];  Service: Endoscopy;  Laterality: N/A;  130  . Esophagogastroduodenoscopy (egd) with esophageal dilation N/A 08/13/2012    Procedure: ESOPHAGOGASTRODUODENOSCOPY (EGD) WITH ESOPHAGEAL DILATION;  Surgeon: Malissa Hippo, MD;  Location: [facility];  Service: Endoscopy;  Laterality: N/A;  315  . Breast surgery      left breast  . Anterior cervical decomp/discectomy fusion N/A 10/21/2012    Procedure: Cervical Six-Seven Anterior cervical decompression/diskectomy/fusion;  Surgeon: Barnett Abu, MD;  Location: [facility];  Service: Neurosurgery;  Laterality: N/A;  Cervical Six-Seven Anterior cervical decompression/diskectomy/fusion  . Cataract extraction w/phaco Right 11/25/2012    Procedure: CATARACT EXTRACTION PHACO AND INTRAOCULAR LENS PLACEMENT (IOC);  Surgeon: Loraine Leriche T. Nile Riggs,  MD;  Location: [facility];  Service: Ophthalmology;  Laterality: Right;  CDE:10.06  . Cataract extraction w/phaco Left 12/09/2012    Procedure: CATARACT EXTRACTION PHACO AND INTRAOCULAR LENS PLACEMENT (IOC);  Surgeon: Loraine Leriche T. Nile Riggs, MD;  Location: [facility];  Service: Ophthalmology;  Laterality: Left;  CDE:5.06   Family History  Problem Relation Age of Onset  . Hypertension Mother   . Heart disease Mother   . Heart attack Mother   . Anxiety disorder Mother   . Cancer Father     lung cancer  . Heart attack Father   . Alcohol abuse Father   . Hypertension Brother   . Alcohol abuse Brother   . Bipolar disorder Brother   . ADD / ADHD  Brother   . Drug abuse Brother   . OCD Brother   . Alcohol abuse Sister   . Bipolar disorder Sister   . Anxiety disorder Sister   . ADD / ADHD Sister   . Drug abuse Sister   . Physical abuse Sister   . Sexual abuse Sister   . Alcohol abuse Brother   . Bipolar disorder Brother   . ADD / ADHD Brother   . Drug abuse Brother   . Alcohol abuse Sister   . Bipolar disorder Sister   . Anxiety disorder Sister   . ADD / ADHD Sister   . Physical abuse Sister   . Sexual abuse Sister   . Alcohol abuse Brother   . Bipolar disorder Brother   . ADD / ADHD Brother   . Bipolar disorder Brother   . ADD / ADHD Brother   . Alcohol abuse Brother   . Paranoid behavior Brother   . Physical abuse Brother   . Sexual abuse Brother   . Bipolar disorder Maternal Aunt   . Bipolar disorder Paternal Aunt   . Bipolar disorder Maternal Uncle   . Bipolar disorder Paternal Uncle   . Bipolar disorder Maternal Grandfather   . Alcohol abuse Maternal Grandfather   . Bipolar disorder Maternal Grandmother   . Alcohol abuse Maternal Grandmother   . Anxiety disorder Maternal Grandmother   . Dementia Maternal Grandmother   . Bipolar disorder Paternal Grandfather   . Alcohol abuse Paternal Grandfather   . Bipolar disorder Paternal Grandmother   . Alcohol abuse Paternal Grandmother   . Drug abuse Paternal Grandmother   . Bipolar disorder Maternal Uncle   . Schizophrenia Neg Hx   . Seizures Neg Hx    History  Substance Use Topics  . Smoking status: Current Every Day Smoker -- 0.50 packs/day for 40 years    Types: Cigarettes  . Smokeless tobacco: Never Used     Comment: 6 or 7 cigs a day as of 05/21/2012  . Alcohol Use: No   OB History   Grav Para Term Preterm Abortions TAB SAB Ect Mult Living   2 1 1  1   1  1      Review of Systems  A complete 10 system review of systems was obtained and all systems are negative except as noted in the HPI and PMH.   Allergies  Codeine; Cyclobenzaprine; Darvocet;  Abilify; Ace inhibitors; Adhesive; Gabapentin; Iodine; Levofloxacin; and Sulfa antibiotics  Home Medications   Current Outpatient Rx  Name  Route  Sig  Dispense  Refill  . albuterol (PROVENTIL HFA;VENTOLIN HFA) 108 (90 BASE) MCG/ACT inhaler   Inhalation   Inhale 2 puffs into the lungs 3 (three) times daily.   1 Inhaler  11   . amitriptyline (ELAVIL) 50 MG tablet   Oral   Take 2 tablets (100 mg total) by mouth at bedtime.   60 tablet   2   . azithromycin (ZITHROMAX) 250 MG tablet   Oral   Take 250-500 mg by mouth daily. 5 day course starting on 12/22/2012. Take two tablets (500mg  total) on day 1 (12/22/2012), then take one tablet (250mg  total) daiyl for 4 days then STOP         . baclofen (LIORESAL) 10 MG tablet   Oral   Take 10 mg by mouth 2 (two) times daily.         . Bromfenac Sodium (PROLENSA) 0.07 % SOLN   Left Eye   Place 1 drop into the left eye daily. For post opt cataract surgery until bottle is empty         . calcium carbonate (OS-CAL) 1250 MG chewable tablet   Oral   Chew 1 tablet by mouth daily as needed for heartburn.         . clonazePAM (KLONOPIN) 1 MG tablet   Oral   Take 1 tablet (1 mg total) by mouth 4 (four) times daily.   120 tablet   2   . DULoxetine (CYMBALTA) 30 MG capsule   Oral   Take 30 mg by mouth daily.         Marland Kitchen escitalopram (LEXAPRO) 20 MG tablet   Oral   Take 1 tablet (20 mg total) by mouth every morning.   30 tablet   2   . feeding supplement, GLUCERNA SHAKE, (GLUCERNA SHAKE) LIQD   Oral   Take 237 mLs by mouth daily.         Marland Kitchen gabapentin (NEURONTIN) 300 MG capsule   Oral   Take 300 mg by mouth 3 (three) times daily.          . hydrochlorothiazide (HYDRODIURIL) 25 MG tablet   Oral   Take 1 tablet (25 mg total) by mouth daily. For blood pressure and swelling   30 tablet   3   . losartan (COZAAR) 50 MG tablet   Oral   Take 1 tablet (50 mg total) by mouth daily.   90 tablet   3   . methocarbamol  (ROBAXIN) 500 MG tablet   Oral   Take 500 mg by mouth 2 (two) times daily.         Marland Kitchen oxybutynin (DITROPAN XL) 10 MG 24 hr tablet   Oral   Take 1 tablet (10 mg total) by mouth daily.   30 tablet   3   . oxyCODONE-acetaminophen (PERCOCET) 10-325 MG per tablet   Oral   Take 1 tablet by mouth every 6 (six) hours as needed for pain.          . pravastatin (PRAVACHOL) 40 MG tablet   Oral   Take 1 tablet (40 mg total) by mouth every evening.   30 tablet   3   . predniSONE (DELTASONE) 2.5 MG tablet   Oral   Take 7.5 mg by mouth daily with breakfast.         . propranolol (INDERAL) 10 MG tablet   Oral   Take 1 tablet (10 mg total) by mouth 3 (three) times daily.   90 tablet   2   . senna (SENNA LAX) 8.6 MG tablet   Oral   Take 1 tablet by mouth 2 (two) times daily.         Marland Kitchen  tiotropium (SPIRIVA HANDIHALER) 18 MCG inhalation capsule   Inhalation   Place 1 capsule (18 mcg total) into inhaler and inhale daily.   30 capsule   6   . albuterol (PROVENTIL) (2.5 MG/3ML) 0.083% nebulizer solution   Nebulization   Take 3 mLs (2.5 mg total) by nebulization every 4 (four) hours as needed for wheezing.   75 mL   3   . guaiFENesin (MUCINEX) 600 MG 12 hr tablet   Oral   Take 1 mg by mouth 2 (two) times daily. 10 da course starting on 12/12/2012         . lidocaine (LIDODERM) 5 %   Transdermal   Place 1 patch onto the skin every 12 (twelve) hours. Remove & Discard patch within 12 hours or as directed by MD   30 patch   2    Triage Vitals: BP 119/103  Pulse 69  Temp(Src) 98.4 F (36.9 C) (Oral)  Resp 20  Ht 5\' 7"  (1.702 m)  Wt 196 lb (88.905 kg)  BMI 30.69 kg/m2  SpO2 98%  Physical Exam  Nursing note and vitals reviewed. Constitutional: She is oriented to person, place, and time. She appears well-developed and well-nourished.  HENT:  Head: Normocephalic.  Eyes: EOM are normal.  Neck: Normal range of motion.  Pulmonary/Chest: Effort normal.  Abdominal: Soft.  She exhibits no distension. There is no tenderness. There is no rebound and no guarding.  Musculoskeletal: Normal range of motion. She exhibits no tenderness.  No tenderness to palpation to the thoracic or lumbar spine.   Neurological: She is alert and oriented to person, place, and time.  Normal strength of bilateral lower extremities.  Psychiatric: She has a normal mood and affect.    ED Course  Procedures (including critical care time)  DIAGNOSTIC STUDIES: Oxygen Saturation is 98% on room air, normal by my interpretation.    COORDINATION OF CARE: 10:29 PM- Discussed that imaging will not be necessary today in the ED. Will order an injection of Dilaudid to manage symptoms. Advised patient to follow up with her PCP. Discussed treatment plan with patient at bedside and patient verbalized agreement.     Labs Review Labs Reviewed - No data to display Imaging Review No results found.  EKG Interpretation   None       MDM   1. Chronic back pain    This appears to be exacerbation of chronic back pain.  Doubt AAA.  Doubt cauda equina.  Doubt spinal epidural abscess.  Discharge him in good condition.  No indication for imaging or additional testing.  Ambulatory today.  Normal strength in lower extremities.  I personally performed the services described in this documentation, which was scribed in my presence. The recorded information has been reviewed and is accurate.       Tonya Co, MD 12/23/12 [phone]

## 2012-12-23 NOTE — Telephone Encounter (Signed)
Patient states that she was seen in [facility] last Tuesday,12-16-12. She states that the test was rough. She was also told that it would be 2 weeks before it was read. Patient states that she is having trouble swallowing liquids, solids, and her pills. Per Dr.Rehman the patient should stay away from foods that are hard to swallow, eat slowly. If this continues she os to go to the ED. Dewayne Hatch will need to try and get the report on Monday,12-29-12. Patient was called and given Dr.Rehman's recommendation.

## 2012-12-23 NOTE — Telephone Encounter (Signed)
LM stating she is still having trouble swallowing with choking spells. Her return phone number is [phone].

## 2012-12-23 NOTE — ED Notes (Signed)
EMS arrived to take pt back to [facility]

## 2012-12-23 NOTE — ED Notes (Signed)
Pt states she had neck and back pain, chronic since her surgeries to both.  Pt currently resides at [facility].   Pt states pain has worsened recently

## 2012-12-23 NOTE — Telephone Encounter (Signed)
Forwarded to Dewayne Hatch as FYI to get the report 12-29-12.

## 2012-12-29 NOTE — Telephone Encounter (Signed)
Spoke to [employer], report will not be ready until after first of January 2015, left message advising patient of this

## 2012-12-29 NOTE — Telephone Encounter (Signed)
Left message for nurse at [facility] requesting results of manometry

## 2013-01-05 ENCOUNTER — Telehealth: Payer: Self-pay | Admitting: Family Medicine

## 2013-01-05 NOTE — Telephone Encounter (Signed)
Pt is wanting to know if she can get a referral to see dr Hardin Negus at [facility] Call back number is [phone]

## 2013-01-05 NOTE — Telephone Encounter (Signed)
LMTRC

## 2013-01-06 ENCOUNTER — Telehealth (INDEPENDENT_AMBULATORY_CARE_PROVIDER_SITE_OTHER): Payer: Self-pay | Admitting: *Deleted

## 2013-01-06 ENCOUNTER — Ambulatory Visit (INDEPENDENT_AMBULATORY_CARE_PROVIDER_SITE_OTHER): Payer: Medicare Other | Admitting: Psychiatry

## 2013-01-06 DIAGNOSIS — F319 Bipolar disorder, unspecified: Secondary | ICD-10-CM

## 2013-01-06 NOTE — Patient Instructions (Signed)
Discussed orally 

## 2013-01-06 NOTE — Telephone Encounter (Signed)
Not yet but will try to get result tomorrow. Ann, please try to get result of EM and impedence study from [facility] and let me know.

## 2013-01-06 NOTE — Telephone Encounter (Signed)
Forwarded to Dr.Rehman as a reminder of test.

## 2013-01-06 NOTE — Progress Notes (Signed)
Patient:  Tonya Sullivan   DOB: 26-Jan-1955  MR Number: [id number]  Location: [facility]:  [address] [place],  [place], [address]  Start: Tuesday 01/07/2012 2:00 PM  Tuesday 01/07/2012 2:20 PM End:   Provider/Observer:     Maurice Small, MSW, LCSW   Chief Complaint:      Chief Complaint  Patient presents with  . Anxiety  . Depression    Reason For Service:     The patient was referred for services by psychiatrist Dr. Adele Schilder due to patient experiencing anxiety, stress, depression, and mood swings. She has a long-standing history of depression beginning in adolescence. Patient also has significant trauma history. Patient also experienced grief and loss issues due to to the death of her husband in Sep 21, 2010.  She continues to experience significant anxiety and stress related to ongoing health issues as well as family problems. Patient is seen today due to increased stress.  Interventions Strategy:  Supportive therapy,  cognitive behavioral therapy  Participation Level:   Active  Participation Quality:  Appropriate      Behavioral Observation:  well groomed, Alert, tearful  Current Psychosocial Factors: Patient still does not have a permanent place to live...she is temporarily residing in a nursing home.  Content of Session:   Reviewing symptoms, processing feelings, identifying support system and ways to use, identifying coping statements and relaxation techniques, identifying ways to improve structure and daily routine  Current Status:   Patient reports continued anxiety, worry, sadness, and fleeting suicidal ideations.  Patient Progress:    The patient reports continued stress as she was not able to obtain the apartment in [place] as she had thought. She hopes to obtain an apartment in her previous apartment complex by the end of this month. She continues to express sadness and confusion regarding her relationship with her son and his family. She has had recent  positive contact with her son via telephone. She reports his girlfriend brought patient the rest  of her belongings yesterday. Patient states feeling very alone. She denies current suicidal ideations but reports having fleeting suicidal ideations on 01/03/2013. Therapist works with patient to process feelings and to identify strengths and resources along with coping statements. Therapist also works with patient to try to reframe negative thinking. Therapist encourages patient to try maintain some involvement in activity. Patient continues to have contact with her friends and states that she visits the other residents in the nursing home. Patient is scheduled to see psychiatrist Dr. Harrington Challenger on 01/07/2013. Patient agrees to call 911 or have someone take her to the emergency room should symptoms worsen.   Target Goals:   1. Improving mood as evidenced by resuming normal interest in activities and increased social involvement. 2. Increase self acceptance and improve self-esteem as evidenced by patient making positive statements about self. 3.  Improve coping and relaxation techniques. 4. Process and resolve grief and loss issues.   Last Reviewed:   04/06/2011  Goals Addressed Today:    Goal  3  Impression/Diagnosis:   The patient presents with a long-standing history of recurrent depression, mood swings, and a significant trauma history.  Diagnosis: Bipolar Disorder  Diagnosis:  Axis I:  Bipolar Disorder           Axis II: Deferred

## 2013-01-06 NOTE — Telephone Encounter (Signed)
Would like to know if Dr. Laural Golden has received her test results from [facility]. If so, would he please give her a call at [phone].

## 2013-01-07 ENCOUNTER — Ambulatory Visit (HOSPITAL_COMMUNITY): Payer: Self-pay | Admitting: Psychiatry

## 2013-01-07 ENCOUNTER — Other Ambulatory Visit: Payer: Self-pay | Admitting: *Deleted

## 2013-01-07 MED ORDER — ALBUTEROL SULFATE HFA 108 (90 BASE) MCG/ACT IN AERS: 2.0000 | INHALATION_SPRAY | Freq: Four times a day (QID) | RESPIRATORY_TRACT | Status: DC | PRN

## 2013-01-07 NOTE — Telephone Encounter (Signed)
I have rec'd report from [facility], it is on your desk

## 2013-01-07 NOTE — Telephone Encounter (Signed)
Changed med to proair, to to [employer]

## 2013-01-08 NOTE — Telephone Encounter (Signed)
Results of esophageal manometry discussed the patient. She has achalasia and will need Heller's myotomy and anti-reflux procedure. Discussed with Dr. Adonis Housekeeper earlier today. He does not do this kind of surgery and we'll schedule office visit with one of his colleagues who does.

## 2013-01-12 ENCOUNTER — Telehealth (INDEPENDENT_AMBULATORY_CARE_PROVIDER_SITE_OTHER): Payer: Self-pay

## 2013-01-12 NOTE — Telephone Encounter (Signed)
Addressed last week.

## 2013-01-12 NOTE — Telephone Encounter (Signed)
Called patient with appointment date and time for 11315 @ 2:50 pm w/Dr. Rosendo Gros.  Patient states she's aware of appointment.

## 2013-01-13 ENCOUNTER — Ambulatory Visit (INDEPENDENT_AMBULATORY_CARE_PROVIDER_SITE_OTHER): Payer: Medicare Other | Admitting: General Surgery

## 2013-01-13 ENCOUNTER — Encounter (INDEPENDENT_AMBULATORY_CARE_PROVIDER_SITE_OTHER): Payer: Self-pay | Admitting: General Surgery

## 2013-01-13 VITALS — BP 140/84 | HR 80 | Temp 97.5°F | Resp 16 | Ht 67.0 in | Wt 193.4 lb

## 2013-01-13 DIAGNOSIS — K220 Achalasia of cardia: Secondary | ICD-10-CM

## 2013-01-13 NOTE — Progress Notes (Signed)
Patient ID: Tonya Sullivan, female   DOB: Sep 24, 1955, 58 y.o.   MRN: [id number]  Chief Complaint  Patient presents with  . Abdominal Pain    heller myotomy    HPI Tonya Sullivan is a 58 y.o. female.  The patient is a 58 year old female who is referred by Dr. Laural Golden secondary to a diagnosis of achalasia. In discussing with the patient she states she's had some emesis on off over the last one to 2 years. The patient underwent manometry as well as esophagogram which revealed findings consistent with achalasia.  The patient states that she has trouble with both solids and liquids. She states that there is no specific timing to her dysphagia and occur spontaneously.    Of note the patient is currently on steroids secondary to Addison's disease. She currently takes 7.5 mg per day. The patient also smokes approximately one pack per day.  HPI  Past Medical History  Diagnosis Date  . COPD (chronic obstructive pulmonary disease)   . Depression   . Chronic back pain     DDD, disc bulge, radiculopathy, spinal stenosis  . Hyperlipidemia   . Anxiety   . Bipolar disorder, unspecified   . Carotid artery calcification   . DDD (degenerative disc disease), lumbosacral   . Spinal stenosis, lumbar   . Melanoma     removed 1 week ago at Dr Teryl Lucy office  . Thyroid disease   . Autoimmune thyroiditis   . Hypertension   . Dysrhythmia     palpitations  . TIA (transient ischemic attack)     august 2014  . Pneumonia     hx of   . GERD (gastroesophageal reflux disease)   . H/O dizziness   . Neuropathic pain   . Adrenal gland dysfunction     Addison's disease  . Addison's disease     Past Surgical History  Procedure Laterality Date  . Appendectomy    . Tonsillectomy    . Cholecystectomy    . Lump left breast      removed- benign  . Abdominal hysterectomy      tubal pregnancy  . Inguinal hernia repair    . Stomach surgery      ?holes in esophgous  . Ectopic pregnancy surgery    . Back  surgery      x 5;1984;1989;;1999;2000;2010  . Colonoscopy  01/25/2012    Procedure: COLONOSCOPY;  Surgeon: Rogene Houston, MD;  [facility]: [facility];  Service: Endoscopy;  Laterality: N/A;  130  . Esophagogastroduodenoscopy (egd) with esophageal dilation N/A 08/13/2012    Procedure: ESOPHAGOGASTRODUODENOSCOPY (EGD) WITH ESOPHAGEAL DILATION;  Surgeon: Rogene Houston, MD;  Location: [facility];  Service: Endoscopy;  Laterality: N/A;  315  . Breast surgery      left breast  . Anterior cervical decomp/discectomy fusion N/A 10/21/2012    Procedure: Cervical Six-Seven Anterior cervical decompression/diskectomy/fusion;  Surgeon: Kristeen Miss, MD;  Location: [place] [facility];  Service: Neurosurgery;  Laterality: N/A;  Cervical Six-Seven Anterior cervical decompression/diskectomy/fusion  . Cataract extraction w/phaco Right 11/25/2012    Procedure: CATARACT EXTRACTION PHACO AND INTRAOCULAR LENS PLACEMENT (IOC);  Surgeon: Elta Guadeloupe T. Gershon Crane, MD;  Location: [facility];  Service: Ophthalmology;  Laterality: Right;  CDE:10.06  . Cataract extraction w/phaco Left 12/09/2012    Procedure: CATARACT EXTRACTION PHACO AND INTRAOCULAR LENS PLACEMENT (IOC);  Surgeon: Elta Guadeloupe T. Gershon Crane, MD;  Location: [facility];  Service: Ophthalmology;  Laterality: Left;  CDE:5.06    Family History  Problem  Relation Age of Onset  . Hypertension Mother   . Heart disease Mother   . Heart attack Mother   . Anxiety disorder Mother   . Cancer Father     lung cancer  . Heart attack Father   . Alcohol abuse Father   . Hypertension Brother   . Alcohol abuse Brother   . Bipolar disorder Brother   . ADD / ADHD Brother   . Drug abuse Brother   . OCD Brother   . Alcohol abuse Sister   . Bipolar disorder Sister   . Anxiety disorder Sister   . ADD / ADHD Sister   . Drug abuse Sister   . Physical abuse Sister   . Sexual abuse Sister   . Alcohol abuse Brother   . Bipolar disorder Brother   . ADD / ADHD Brother   . Drug abuse Brother   .  Alcohol abuse Sister   . Bipolar disorder Sister   . Anxiety disorder Sister   . ADD / ADHD Sister   . Physical abuse Sister   . Sexual abuse Sister   . Alcohol abuse Brother   . Bipolar disorder Brother   . ADD / ADHD Brother   . Bipolar disorder Brother   . ADD / ADHD Brother   . Alcohol abuse Brother   . Paranoid behavior Brother   . Physical abuse Brother   . Sexual abuse Brother   . Bipolar disorder Maternal Aunt   . Bipolar disorder Paternal Aunt   . Bipolar disorder Maternal Uncle   . Bipolar disorder Paternal Uncle   . Bipolar disorder Maternal Grandfather   . Alcohol abuse Maternal Grandfather   . Bipolar disorder Maternal Grandmother   . Alcohol abuse Maternal Grandmother   . Anxiety disorder Maternal Grandmother   . Dementia Maternal Grandmother   . Bipolar disorder Paternal Grandfather   . Alcohol abuse Paternal Grandfather   . Bipolar disorder Paternal Grandmother   . Alcohol abuse Paternal Grandmother   . Drug abuse Paternal Grandmother   . Bipolar disorder Maternal Uncle   . Schizophrenia Neg Hx   . Seizures Neg Hx     Social History History  Substance Use Topics  . Smoking status: Current Every Day Smoker -- 0.50 packs/day for 40 years    Types: Cigarettes  . Smokeless tobacco: Never Used     Comment: 6 or 7 cigs a day as of 05/21/2012  . Alcohol Use: No    Allergies  Allergen Reactions  . Codeine Itching  . Cyclobenzaprine Hives  . Darvocet [Propoxyphene N-Acetaminophen] Rash  . Abilify [Aripiprazole] Other (See Comments)    tremors  . Ace Inhibitors   . Adhesive [Tape] Hives  . Gabapentin Swelling  . Iodine Hives  . Levofloxacin Hives and Hypertension  . Sulfa Antibiotics Itching    Current Outpatient Prescriptions  Medication Sig Dispense Refill  . albuterol (PROAIR HFA) 108 (90 BASE) MCG/ACT inhaler Inhale 2 puffs into the lungs every 6 (six) hours as needed for wheezing or shortness of breath.  1 Inhaler  3  . albuterol (PROVENTIL  HFA;VENTOLIN HFA) 108 (90 BASE) MCG/ACT inhaler Inhale 2 puffs into the lungs 3 (three) times daily.  1 Inhaler  11  . albuterol (PROVENTIL) (2.5 MG/3ML) 0.083% nebulizer solution Take 3 mLs (2.5 mg total) by nebulization every 4 (four) hours as needed for wheezing.  75 mL  3  . amitriptyline (ELAVIL) 50 MG tablet Take 2 tablets (100 mg total) by mouth  at bedtime.  60 tablet  2  . azithromycin (ZITHROMAX) 250 MG tablet Take 250-500 mg by mouth daily. 5 day course starting on 12/22/2012. Take two tablets (500mg  total) on day 1 (12/22/2012), then take one tablet (250mg  total) daiyl for 4 days then STOP      . baclofen (LIORESAL) 10 MG tablet Take 10 mg by mouth 2 (two) times daily.      . Bromfenac Sodium (PROLENSA) 0.07 % SOLN Place 1 drop into the left eye daily. For post opt cataract surgery until bottle is empty      . calcium carbonate (OS-CAL) 1250 MG chewable tablet Chew 1 tablet by mouth daily as needed for heartburn.      . clonazePAM (KLONOPIN) 1 MG tablet Take 1 tablet (1 mg total) by mouth 4 (four) times daily.  120 tablet  2  . DULoxetine (CYMBALTA) 30 MG capsule Take 30 mg by mouth daily.      Marland Kitchen escitalopram (LEXAPRO) 20 MG tablet Take 1 tablet (20 mg total) by mouth every morning.  30 tablet  2  . feeding supplement, GLUCERNA SHAKE, (GLUCERNA SHAKE) LIQD Take 237 mLs by mouth daily.      Marland Kitchen gabapentin (NEURONTIN) 300 MG capsule Take 300 mg by mouth 3 (three) times daily.       Marland Kitchen guaiFENesin (MUCINEX) 600 MG 12 hr tablet Take 1 mg by mouth 2 (two) times daily. [place] course starting on 12/12/2012      . hydrochlorothiazide (HYDRODIURIL) 25 MG tablet Take 1 tablet (25 mg total) by mouth daily. For blood pressure and swelling  30 tablet  3  . lidocaine (LIDODERM) 5 % Place 1 patch onto the skin every 12 (twelve) hours. Remove & Discard patch within 12 hours or as directed by MD  30 patch  2  . losartan (COZAAR) 50 MG tablet Take 1 tablet (50 mg total) by mouth daily.  90 tablet  3  .  methocarbamol (ROBAXIN) 500 MG tablet Take 500 mg by mouth 2 (two) times daily.      Marland Kitchen nystatin (MYCOSTATIN) 100000 UNIT/ML suspension       . oxybutynin (DITROPAN XL) 10 MG 24 hr tablet Take 1 tablet (10 mg total) by mouth daily.  30 tablet  3  . oxyCODONE-acetaminophen (PERCOCET) 10-325 MG per tablet Take 1 tablet by mouth every 6 (six) hours as needed for pain.       . pravastatin (PRAVACHOL) 40 MG tablet Take 1 tablet (40 mg total) by mouth every evening.  30 tablet  3  . predniSONE (DELTASONE) 2.5 MG tablet Take 7.5 mg by mouth daily with breakfast.      . propranolol (INDERAL) 10 MG tablet Take 1 tablet (10 mg total) by mouth 3 (three) times daily.  90 tablet  2  . tiotropium (SPIRIVA HANDIHALER) 18 MCG inhalation capsule Place 1 capsule (18 mcg total) into inhaler and inhale daily.  30 capsule  6  . senna (SENNA LAX) 8.6 MG tablet Take 1 tablet by mouth 2 (two) times daily.      . [DISCONTINUED] Gabapentin, PHN, (GRALISE STARTER) 300 & 600 MG MISC Take 300-1,800 mg by mouth as directed. 15 DAY Starter pack doses     DAY 1- 300 mg     DAY 2- 600 mg     DAY 3 to 6 - 900 mg    DAY 7 to 10 - 1200 mg     DAY 11 to 14 - 1500 mg  DAY 15 - 1800 mg        No current facility-administered medications for this visit.    Review of Systems Review of Systems  Constitutional: Negative.   HENT: Negative.   Respiratory: Negative.   Cardiovascular: Negative.   Gastrointestinal: Positive for vomiting.  Neurological: Negative.   All other systems reviewed and are negative.    Blood pressure 140/84, pulse 80, temperature 97.5 F (36.4 C), temperature source Temporal, resp. rate 16, height 5\' 7"  (1.702 m), weight 193 lb 6.4 oz (87.726 kg).  Physical Exam Physical Exam  Vitals reviewed. Constitutional: She is oriented to person, place, and time. She appears well-developed and well-nourished.  HENT:  Head: Normocephalic and atraumatic.  Eyes: Conjunctivae and EOM are normal. Pupils are equal,  round, and reactive to light.  Neck: Normal range of motion. Neck supple.  Cardiovascular: Normal rate, regular rhythm and normal heart sounds.   Pulmonary/Chest: Effort normal and breath sounds normal.  Abdominal: Soft. Bowel sounds are normal. She exhibits no distension and no mass. There is no tenderness. There is no rebound and no guarding.  Musculoskeletal: Normal range of motion.  Neurological: She is alert and oriented to person, place, and time.  Skin: Skin is warm and dry.  Psychiatric: She has a normal mood and affect.    Data Reviewed Manometry studies which reveal findings of achalasia Upper GI swallow study which reveals birds beak consistent with achalasia. There is no sigmoid change the esophagus.  Assessment    58 year old female with findings of achalasia. Patient also has a long past medical history that is complicated by steroid use for Addison's disease as well as smoking. I discussed the procedure with the patient in detail and the need to alter smoking habits to assist with healing.     Plan    1.I have discussed with the patient the need to decrease her smoking habits at this time. Will have patient follow back up in 2 weeks to assess how much she's cutdown at that time. 2. The patient will need vitamin A to help combat her steroid use. 3. I hope to discuss with the patient on the next visit to schedule the patient for a lap Heller myotomy with a Gore wrap.        Rosario Jacks., Shalin Vonbargen 01/13/2013, 2:58 PM

## 2013-01-15 ENCOUNTER — Encounter (HOSPITAL_COMMUNITY): Payer: Self-pay | Admitting: Psychiatry

## 2013-01-15 ENCOUNTER — Ambulatory Visit (INDEPENDENT_AMBULATORY_CARE_PROVIDER_SITE_OTHER): Payer: Medicare Other | Admitting: Psychiatry

## 2013-01-15 VITALS — BP 120/80 | Ht 67.0 in | Wt 196.0 lb

## 2013-01-15 DIAGNOSIS — F329 Major depressive disorder, single episode, unspecified: Secondary | ICD-10-CM

## 2013-01-15 DIAGNOSIS — F32A Depression, unspecified: Secondary | ICD-10-CM

## 2013-01-15 DIAGNOSIS — R52 Pain, unspecified: Secondary | ICD-10-CM

## 2013-01-15 DIAGNOSIS — F411 Generalized anxiety disorder: Secondary | ICD-10-CM

## 2013-01-15 DIAGNOSIS — F332 Major depressive disorder, recurrent severe without psychotic features: Secondary | ICD-10-CM

## 2013-01-15 DIAGNOSIS — F319 Bipolar disorder, unspecified: Secondary | ICD-10-CM

## 2013-01-15 MED ORDER — PROPRANOLOL HCL 10 MG PO TABS: 10.0000 mg | ORAL_TABLET | Freq: Three times a day (TID) | ORAL | Status: DC

## 2013-01-15 MED ORDER — CLONAZEPAM 1 MG PO TABS: 1.0000 mg | ORAL_TABLET | Freq: Four times a day (QID) | ORAL | Status: DC

## 2013-01-15 MED ORDER — ESCITALOPRAM OXALATE 20 MG PO TABS: 20.0000 mg | ORAL_TABLET | ORAL | Status: DC

## 2013-01-15 MED ORDER — DULOXETINE HCL 60 MG PO CPEP: 60.0000 mg | ORAL_CAPSULE | Freq: Every day | ORAL | Status: DC

## 2013-01-15 MED ORDER — AMITRIPTYLINE HCL 50 MG PO TABS: 100.0000 mg | ORAL_TABLET | Freq: Every day | ORAL | Status: DC

## 2013-01-15 MED ORDER — GABAPENTIN 300 MG PO CAPS: 300.0000 mg | ORAL_CAPSULE | Freq: Three times a day (TID) | ORAL | Status: DC
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
## 2013-01-19 ENCOUNTER — Telehealth: Payer: Self-pay | Admitting: *Deleted

## 2013-01-19 NOTE — Telephone Encounter (Signed)
LMTRC

## 2013-01-19 NOTE — Telephone Encounter (Signed)
Since she is already in a pain clinic with Dr. Merlene Laughter She has to make appt for this and records need to be released

## 2013-01-19 NOTE — Telephone Encounter (Signed)
Pt aware of message and has appt set up for next Monday 26th january

## 2013-01-20 ENCOUNTER — Ambulatory Visit (INDEPENDENT_AMBULATORY_CARE_PROVIDER_SITE_OTHER): Payer: Medicare Other | Admitting: Psychiatry

## 2013-01-20 ENCOUNTER — Telehealth (INDEPENDENT_AMBULATORY_CARE_PROVIDER_SITE_OTHER): Payer: Self-pay

## 2013-01-20 DIAGNOSIS — F319 Bipolar disorder, unspecified: Secondary | ICD-10-CM

## 2013-01-20 NOTE — Progress Notes (Signed)
Patient:  Tonya Sullivan   DOB: 1955-05-08  MR Number: [id number]  Location: [place]:  [address] [place],  [place], [address]  Start: Tuesday 01/20/2013  2:00 PM  Tuesday 01/20/2013  2:30 PM End:   Provider/Observer:     Maurice Small, MSW, LCSW   Chief Complaint:      Chief Complaint  Patient presents with  . Stress    Reason For Service:     The patient was referred for services by psychiatrist Dr. Adele Schilder due to patient experiencing anxiety, stress, depression, and mood swings. She has a long-standing history of depression beginning in adolescence. Patient also has significant trauma history. Patient also experienced grief and loss issues due to to the death of her husband in 2010-10-25.  She continues to experience significant anxiety and stress related to ongoing health issues as well as family problems. Patient is seen today for a follow-up appointment  Interventions Strategy:  Supportive therapy,  cognitive behavioral therapy  Participation Level:   Active  Participation Quality:  Appropriate      Behavioral Observation:  well groomed, Alert, talkative  Current Psychosocial Factors: Patient reports has strained relationship with son and his family. Patient is in the process of obtaining another apartment.  Content of Session:   Reviewing symptoms, processing feelings, identifying support system and ways to use, identifying coping statements and relaxation techniques, identifying ways to improve structure and daily routine  Current Status:   Patient reports decreased anxiety, decreased worry, and improved mood. She denies any suicidal ideations since last session.  Patient Progress:    The patient reports feeling better since last session. She is pleased that she has begun completing paperwork to obtain an apartment in her former apartment complex. Per patient's report, she and apartment manager are waiting for final approval. Patient hopes to move by the  end of the week. She is expressing some anxiety about trying to find assistance for the move. Therapist works with patient to explore possible resources. Patient recently learned that she needs esophageal surgery. However, she has to reduce nicotine use which she reports she has done successfully using the patch. Patient reports she hasn't had contact from her son in 2 weeks. She continues to experience confusion and frustration regarding the change in the relationship with her son and his family. However, she is not as stressed by this and is focusing on other people in her support system. She hopes to go to lunch with a group of friends she met at the nursing home next month. Therapist works with patient to identify benefits and strengths patient gained through recent adversity .   Target Goals:   1. Improving mood as evidenced by resuming normal interest in activities and increased social involvement. 2. Increase self acceptance and improve self-esteem as evidenced by patient making positive statements about self. 3.  Improve coping and relaxation techniques. 4. Process and resolve grief and loss issues.   Last Reviewed:   04/06/2011  Goals Addressed Today:    Goal  2, 3  Impression/Diagnosis:   The patient presents with a long-standing history of recurrent depression, mood swings, and a significant trauma history.  Diagnosis: Bipolar Disorder  Diagnosis:  Axis I:  Bipolar Disorder           Axis II: Deferred

## 2013-01-20 NOTE — Telephone Encounter (Signed)
Pt called stating she woke up last night coughing and threw up dark fluid. I asked the pt what she ate or drank for supper or prior to bed. Pt states she does not remember what she had to eat last night but did have a cup off coffee late. Pt only vomited once and has not since. No abd pain. Pt advised she probably threw up the coffee that was dark. Pt agrees.  I advised pt if she vomits again, has abd pain or dark stools she is to call back or go to ER. Pt states she understands.

## 2013-01-20 NOTE — Patient Instructions (Signed)
Discussed orally 

## 2013-01-26 ENCOUNTER — Ambulatory Visit (INDEPENDENT_AMBULATORY_CARE_PROVIDER_SITE_OTHER): Payer: Medicare Other | Admitting: Family Medicine

## 2013-01-26 VITALS — BP 118/70 | HR 78 | Temp 98.7°F | Resp 20 | Ht 67.0 in | Wt 199.5 lb

## 2013-01-26 DIAGNOSIS — G8929 Other chronic pain: Secondary | ICD-10-CM

## 2013-01-26 DIAGNOSIS — M47812 Spondylosis without myelopathy or radiculopathy, cervical region: Secondary | ICD-10-CM

## 2013-01-26 DIAGNOSIS — J310 Chronic rhinitis: Secondary | ICD-10-CM

## 2013-01-26 DIAGNOSIS — M549 Dorsalgia, unspecified: Secondary | ICD-10-CM

## 2013-01-26 NOTE — Patient Instructions (Signed)
Referral to new pain clinic Continue current meds Try flonase for the nasal drainage and congestion F/U 3 months

## 2013-01-27 ENCOUNTER — Other Ambulatory Visit (INDEPENDENT_AMBULATORY_CARE_PROVIDER_SITE_OTHER): Payer: Self-pay

## 2013-01-27 ENCOUNTER — Encounter (INDEPENDENT_AMBULATORY_CARE_PROVIDER_SITE_OTHER): Payer: Self-pay | Admitting: General Surgery

## 2013-01-27 ENCOUNTER — Encounter: Payer: Self-pay | Admitting: Family Medicine

## 2013-01-27 ENCOUNTER — Ambulatory Visit (INDEPENDENT_AMBULATORY_CARE_PROVIDER_SITE_OTHER): Payer: Medicare Other | Admitting: General Surgery

## 2013-01-27 VITALS — BP 116/78 | HR 80 | Resp 16 | Ht 67.0 in | Wt 200.0 lb

## 2013-01-27 DIAGNOSIS — J310 Chronic rhinitis: Secondary | ICD-10-CM | POA: Insufficient documentation

## 2013-01-27 DIAGNOSIS — K220 Achalasia of cardia: Secondary | ICD-10-CM

## 2013-01-27 NOTE — Progress Notes (Signed)
   Subjective:    Patient ID: Tonya Sullivan, female    DOB: 1955-12-28, 58 y.o.   MRN: [id number]  HPI Patient is here for referral to pain clinic. She is actually still in [facility] [place] [facility] [facility]. She's unhappy with her current pain management provider and will like to change to a new one for better pain control. She's concerned because they would not put her on a long-acting medication to control her pain and states she is on Percocet 10 325 as well as gabapentin. She's currently in pain management secondary to chronic neck pain which she's had multiple surgeries as well as her recent compression and 2014 she's also here for chronic back pain which she's also had multiple surgeries for  She also complains of nasal congestion rhinitis she's been given Mucinex at the nursing home but this has not been helping the nasal symptoms.   Review of Systems - per above  GEN- denies fatigue, fever, weight loss,weakness, recent illness HEENT- denies eye drainage, change in vision,+ nasal discharge, CVS- denies chest pain, palpitations RESP- denies SOB, cough, wheeze MSK- denies joint pain, muscle aches, injury Neuro- denies headache, dizziness, syncope, seizure activity       Objective:   Physical Exam GEN-NAD,alert and oriented x 3 HEENT- PERRL, EOMI, MMM, nares clear rhinorrhea, no maxillary sinus tenderness, TM clear bilat CVS-RRR, no murmur RESP-CTAB       Assessment & Plan:

## 2013-01-27 NOTE — Assessment & Plan Note (Signed)
Rhinitis and congestion. Will try her on Flonase Prescription was written so that she can get this to the nursing home

## 2013-01-27 NOTE — Progress Notes (Signed)
Patient ID: Tonya Sullivan, female   DOB: 1955-07-21, 58 y.o.   MRN: [id number]  Chief Complaint  Patient presents with  . Follow-up    discuss sx    HPI Tonya Sullivan is a 58 y.o. female.  Patient is a 58 year old female who is referred by Dr. Laural Golden  Secondary to a diagnosis of achalasia per manometry. Patient has undergone a previous esophagogram which reveals a dilated esophagus as well as a birds beak esophagus.  Since her last visit the patient states she's got down to approximately 5 cigarettes a day. Patient does have history of Addison's disease and is on 7.5 mg of prednisone.  The patient is an approximate 3 previous esophageal dilations. Patient also has had previous esophageal surgery,? Wrap, for "holes in her esophagus "is unsure of what exactly was done. She states this was approximately 12-15 years ago by a Psychologist, sport and exercise in [place] at [facility].  The patient continues with dysphagia at this time. HPI  Past Medical History  Diagnosis Date  . COPD (chronic obstructive pulmonary disease)   . Depression   . Chronic back pain     DDD, disc bulge, radiculopathy, spinal stenosis  . Hyperlipidemia   . Anxiety   . Bipolar disorder, unspecified   . Carotid artery calcification   . DDD (degenerative disc disease), lumbosacral   . Spinal stenosis, lumbar   . Melanoma     removed 1 week ago at Dr Teryl Lucy office  . Thyroid disease   . Autoimmune thyroiditis   . Hypertension   . Dysrhythmia     palpitations  . TIA (transient ischemic attack)     august 2014  . Pneumonia     hx of   . GERD (gastroesophageal reflux disease)   . H/O dizziness   . Neuropathic pain   . Adrenal gland dysfunction     Addison's disease  . Addison's disease     Past Surgical History  Procedure Laterality Date  . Appendectomy    . Tonsillectomy    . Cholecystectomy    . Lump left breast      removed- benign  . Abdominal hysterectomy      tubal pregnancy  . Inguinal hernia repair    . Stomach  surgery      ?holes in esophgous  . Ectopic pregnancy surgery    . Back surgery      x 5;1984;1989;;1999;2000;2010  . Colonoscopy  01/25/2012    Procedure: COLONOSCOPY;  Surgeon: Rogene Houston, MD;  [facility]: [facility];  Service: Endoscopy;  Laterality: N/A;  130  . Esophagogastroduodenoscopy (egd) with esophageal dilation N/A 08/13/2012    Procedure: ESOPHAGOGASTRODUODENOSCOPY (EGD) WITH ESOPHAGEAL DILATION;  Surgeon: Rogene Houston, MD;  Location: [facility];  Service: Endoscopy;  Laterality: N/A;  315  . Breast surgery      left breast  . Anterior cervical decomp/discectomy fusion N/A 10/21/2012    Procedure: Cervical Six-Seven Anterior cervical decompression/diskectomy/fusion;  Surgeon: Kristeen Miss, MD;  Location: [place] [facility];  Service: Neurosurgery;  Laterality: N/A;  Cervical Six-Seven Anterior cervical decompression/diskectomy/fusion  . Cataract extraction w/phaco Right 11/25/2012    Procedure: CATARACT EXTRACTION PHACO AND INTRAOCULAR LENS PLACEMENT (IOC);  Surgeon: Elta Guadeloupe T. Gershon Crane, MD;  Location: [facility];  Service: Ophthalmology;  Laterality: Right;  CDE:10.06  . Cataract extraction w/phaco Left 12/09/2012    Procedure: CATARACT EXTRACTION PHACO AND INTRAOCULAR LENS PLACEMENT (IOC);  Surgeon: Elta Guadeloupe T. Gershon Crane, MD;  Location: [facility];  Service: Ophthalmology;  Laterality: Left;  CDE:5.06    Family History  Problem Relation Age of Onset  . Hypertension Mother   . Heart disease Mother   . Heart attack Mother   . Anxiety disorder Mother   . Cancer Father     lung cancer  . Heart attack Father   . Alcohol abuse Father   . Hypertension Brother   . Alcohol abuse Brother   . Bipolar disorder Brother   . ADD / ADHD Brother   . Drug abuse Brother   . OCD Brother   . Alcohol abuse Sister   . Bipolar disorder Sister   . Anxiety disorder Sister   . ADD / ADHD Sister   . Drug abuse Sister   . Physical abuse Sister   . Sexual abuse Sister   . Alcohol abuse Brother   .  Bipolar disorder Brother   . ADD / ADHD Brother   . Drug abuse Brother   . Alcohol abuse Sister   . Bipolar disorder Sister   . Anxiety disorder Sister   . ADD / ADHD Sister   . Physical abuse Sister   . Sexual abuse Sister   . Alcohol abuse Brother   . Bipolar disorder Brother   . ADD / ADHD Brother   . Bipolar disorder Brother   . ADD / ADHD Brother   . Alcohol abuse Brother   . Paranoid behavior Brother   . Physical abuse Brother   . Sexual abuse Brother   . Bipolar disorder Maternal Aunt   . Bipolar disorder Paternal Aunt   . Bipolar disorder Maternal Uncle   . Bipolar disorder Paternal Uncle   . Bipolar disorder Maternal Grandfather   . Alcohol abuse Maternal Grandfather   . Bipolar disorder Maternal Grandmother   . Alcohol abuse Maternal Grandmother   . Anxiety disorder Maternal Grandmother   . Dementia Maternal Grandmother   . Bipolar disorder Paternal Grandfather   . Alcohol abuse Paternal Grandfather   . Bipolar disorder Paternal Grandmother   . Alcohol abuse Paternal Grandmother   . Drug abuse Paternal Grandmother   . Bipolar disorder Maternal Uncle   . Schizophrenia Neg Hx   . Seizures Neg Hx     Social History History  Substance Use Topics  . Smoking status: Current Every Day Smoker -- 0.50 packs/day for 40 years    Types: Cigarettes  . Smokeless tobacco: Never Used     Comment: 6 or 7 cigs a day as of 05/21/2012  . Alcohol Use: No    Allergies  Allergen Reactions  . Codeine Itching  . Cyclobenzaprine Hives  . Darvocet [Propoxyphene N-Acetaminophen] Rash  . Abilify [Aripiprazole] Other (See Comments)    tremors  . Ace Inhibitors   . Adhesive [Tape] Hives  . Gabapentin Swelling  . Iodine Hives  . Levofloxacin Hives and Hypertension  . Sulfa Antibiotics Itching    Current Outpatient Prescriptions  Medication Sig Dispense Refill  . albuterol (PROAIR HFA) 108 (90 BASE) MCG/ACT inhaler Inhale 2 puffs into the lungs every 6 (six) hours as needed  for wheezing or shortness of breath.  1 Inhaler  3  . albuterol (PROVENTIL HFA;VENTOLIN HFA) 108 (90 BASE) MCG/ACT inhaler Inhale 2 puffs into the lungs 3 (three) times daily.  1 Inhaler  11  . albuterol (PROVENTIL) (2.5 MG/3ML) 0.083% nebulizer solution Take 3 mLs (2.5 mg total) by nebulization every 4 (four) hours as needed for wheezing.  75 mL  3  . amitriptyline (ELAVIL)  100 MG tablet       . baclofen (LIORESAL) 10 MG tablet Take 10 mg by mouth 2 (two) times daily.      . Bromfenac Sodium (PROLENSA) 0.07 % SOLN Place 1 drop into the left eye daily. For post opt cataract surgery until bottle is empty      . calcium carbonate (OS-CAL) 1250 MG chewable tablet Chew 1 tablet by mouth daily as needed for heartburn.      . clonazePAM (KLONOPIN) 1 MG tablet Take 1 tablet (1 mg total) by mouth 4 (four) times daily.  120 tablet  2  . DULoxetine (CYMBALTA) 60 MG capsule Take 1 capsule (60 mg total) by mouth daily.  30 capsule  2  . escitalopram (LEXAPRO) 20 MG tablet Take 1 tablet (20 mg total) by mouth every morning.  30 tablet  2  . feeding supplement, GLUCERNA SHAKE, (GLUCERNA SHAKE) LIQD Take 237 mLs by mouth daily.      Marland Kitchen gabapentin (NEURONTIN) 300 MG capsule Take 1 capsule (300 mg total) by mouth 3 (three) times daily.  90 capsule  3  . hydrochlorothiazide (HYDRODIURIL) 25 MG tablet Take 1 tablet (25 mg total) by mouth daily. For blood pressure and swelling  30 tablet  3  . lidocaine (LIDODERM) 5 % Place 1 patch onto the skin every 12 (twelve) hours. Remove & Discard patch within 12 hours or as directed by MD  30 patch  2  . losartan (COZAAR) 50 MG tablet Take 1 tablet (50 mg total) by mouth daily.  90 tablet  3  . methocarbamol (ROBAXIN) 500 MG tablet Take 500 mg by mouth 2 (two) times daily.      Marland Kitchen nystatin (MYCOSTATIN) 100000 UNIT/ML suspension       . oxybutynin (DITROPAN XL) 10 MG 24 hr tablet Take 1 tablet (10 mg total) by mouth daily.  30 tablet  3  . oxyCODONE-acetaminophen (PERCOCET) 10-325  MG per tablet Take 1 tablet by mouth every 6 (six) hours as needed for pain.       . pravastatin (PRAVACHOL) 40 MG tablet Take 1 tablet (40 mg total) by mouth every evening.  30 tablet  3  . predniSONE (DELTASONE) 2.5 MG tablet Take 7.5 mg by mouth daily with breakfast.      . propranolol (INDERAL) 10 MG tablet Take 1 tablet (10 mg total) by mouth 3 (three) times daily.  90 tablet  2  . senna (SENNA LAX) 8.6 MG tablet Take 1 tablet by mouth 2 (two) times daily.      Marland Kitchen tiotropium (SPIRIVA HANDIHALER) 18 MCG inhalation capsule Place 1 capsule (18 mcg total) into inhaler and inhale daily.  30 capsule  6  . [DISCONTINUED] Gabapentin, PHN, (GRALISE STARTER) 300 & 600 MG MISC Take 300-1,800 mg by mouth as directed. 15 DAY Starter pack doses     DAY 1- 300 mg     DAY 2- 600 mg     DAY 3 to 6 - 900 mg    DAY 7 to 10 - 1200 mg     DAY 11 to 14 - 1500 mg      DAY 15 - 1800 mg        No current facility-administered medications for this visit.    Review of Systems Review of Systems  Constitutional: Negative.   HENT: Negative.   Respiratory: Negative.   Cardiovascular: Negative.   Gastrointestinal: Positive for vomiting.  Neurological: Negative.   All other systems reviewed and  are negative.    Blood pressure 116/78, pulse 80, resp. rate 16, height 5\' 7"  (1.702 m), weight 200 lb (90.719 kg).  Physical Exam Physical Exam  Constitutional: She is oriented to person, place, and time. She appears well-developed and well-nourished.  HENT:  Head: Normocephalic and atraumatic.  Eyes: Conjunctivae and EOM are normal. Pupils are equal, round, and reactive to light.  Neck: Normal range of motion. Neck supple.  Cardiovascular: Normal rate, regular rhythm and normal heart sounds.   Pulmonary/Chest: Effort normal and breath sounds normal.  Abdominal: Soft. Bowel sounds are normal. She exhibits no distension and no mass. There is no tenderness. There is no rebound and no guarding.  Musculoskeletal: Normal  range of motion.  Neurological: She is alert and oriented to person, place, and time.  Skin: Skin is warm and dry.  Psychiatric: She has a normal mood and affect.    Data Reviewed Manometry reveals aperistalsis.  Esophagogram reveals a dilated esophagus and burred the esophagus  Assessment    58 year old female with achalasia and a history of a previous stomach wrap.     Plan    1. Still the patient in regards to trying to get as her previous op records from her stomach surgery. We will also try to get in touch with Dr. Laural Golden and see if there are any records from that surgery that he has.  2. I discussed with her the procedure in detail, of a Heller myotomy and possible partial wrap.. I also discussed with her that secondary her previous dilations and previous esophageal surgery this could potentially cause increased risk to possible complications and difficulties of the surgery. I did discuss with her the risks and benefits of the procedure to include but not limited to: Infection, bleeding, esophageal perforation, need for possible stent, need for possible further surgeries, possible nonhealing, and possible death. The patient voiced understanding of these complications and wished to proceed at this time. 2. The patient will need a steroid bolus prior surgery.       Rosario Jacks., Maragret Vanacker 01/27/2013, 12:10 PM

## 2013-01-27 NOTE — Assessment & Plan Note (Signed)
S/p decompression in Oct 2014 Multiple surgeries Chronic narcotic medication

## 2013-01-27 NOTE — Assessment & Plan Note (Signed)
Multiple surgeries, on disability and pain management currently, will refer to new pain managment Spinal stenosis, DDD, disc bulge, radicular symptoms Chronic narcotic use

## 2013-02-02 ENCOUNTER — Encounter (INDEPENDENT_AMBULATORY_CARE_PROVIDER_SITE_OTHER): Payer: Self-pay | Admitting: *Deleted

## 2013-02-04 ENCOUNTER — Ambulatory Visit (HOSPITAL_COMMUNITY): Payer: Self-pay | Admitting: Psychiatry

## 2013-02-04 ENCOUNTER — Encounter (INDEPENDENT_AMBULATORY_CARE_PROVIDER_SITE_OTHER): Payer: Self-pay

## 2013-02-09 ENCOUNTER — Other Ambulatory Visit: Payer: Medicare Other | Admitting: Obstetrics and Gynecology

## 2013-02-10 ENCOUNTER — Encounter (HOSPITAL_COMMUNITY): Payer: Self-pay | Admitting: Pharmacy Technician

## 2013-02-11 ENCOUNTER — Other Ambulatory Visit: Payer: Medicare Other | Admitting: Obstetrics and Gynecology

## 2013-02-12 ENCOUNTER — Encounter (HOSPITAL_COMMUNITY): Payer: Self-pay | Admitting: Psychiatry

## 2013-02-12 ENCOUNTER — Ambulatory Visit (INDEPENDENT_AMBULATORY_CARE_PROVIDER_SITE_OTHER): Payer: Medicare Other | Admitting: Psychiatry

## 2013-02-12 VITALS — BP 110/78 | Ht 67.0 in | Wt 202.0 lb

## 2013-02-12 DIAGNOSIS — F3289 Other specified depressive episodes: Secondary | ICD-10-CM

## 2013-02-12 DIAGNOSIS — F319 Bipolar disorder, unspecified: Secondary | ICD-10-CM

## 2013-02-12 DIAGNOSIS — F32A Depression, unspecified: Secondary | ICD-10-CM

## 2013-02-12 DIAGNOSIS — F329 Major depressive disorder, single episode, unspecified: Secondary | ICD-10-CM

## 2013-02-12 MED ORDER — ESCITALOPRAM OXALATE 20 MG PO TABS: 20.0000 mg | ORAL_TABLET | ORAL | Status: DC

## 2013-02-12 MED ORDER — DULOXETINE HCL 60 MG PO CPEP: 60.0000 mg | ORAL_CAPSULE | Freq: Every morning | ORAL | Status: DC

## 2013-02-12 MED ORDER — AMITRIPTYLINE HCL 100 MG PO TABS: 100.0000 mg | ORAL_TABLET | Freq: Every day | ORAL | Status: DC

## 2013-02-12 MED ORDER — CLONAZEPAM 1 MG PO TABS: 1.0000 mg | ORAL_TABLET | Freq: Four times a day (QID) | ORAL | Status: DC
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
## 2013-02-13 ENCOUNTER — Encounter (HOSPITAL_COMMUNITY)
Admission: RE | Admit: 2013-02-13 | Discharge: 2013-02-13 | Disposition: A | Discharge status: Home / Self Care | Payer: Medicare Other | Source: Ambulatory Visit | Attending: General Surgery | Admitting: General Surgery

## 2013-02-13 ENCOUNTER — Encounter (HOSPITAL_COMMUNITY): Payer: Self-pay

## 2013-02-13 DIAGNOSIS — Z01812 Encounter for preprocedural laboratory examination: Secondary | ICD-10-CM | POA: Insufficient documentation

## 2013-02-13 HISTORY — DX: Achalasia of cardia: K22.0

## 2013-02-13 LAB — COMPREHENSIVE METABOLIC PANEL
ALBUMIN: 3.8 g/dL (ref 3.5–5.2)
ALT: 18 U/L (ref 0–35)
AST: 20 U/L (ref 0–37)
Alkaline Phosphatase: 54 U/L (ref 39–117)
BILIRUBIN TOTAL: 0.3 mg/dL (ref 0.3–1.2)
BUN: 13 mg/dL (ref 6–23)
CALCIUM: 9.8 mg/dL (ref 8.4–10.5)
CHLORIDE: 94 meq/L — AB (ref 96–112)
CO2: 31 meq/L (ref 19–32)
CREATININE: 1 mg/dL (ref 0.50–1.10)
GFR calc Af Amer: 71 mL/min — ABNORMAL LOW (ref >90)
GFR, EST NON AFRICAN AMERICAN: 61 mL/min — AB (ref >90)
Glucose, Bld: 98 mg/dL (ref 70–99)
Potassium: 5 mEq/L (ref 3.7–5.3)
SODIUM: 135 meq/L — AB (ref 137–147)
Total Protein: 7.4 g/dL (ref 6.0–8.3)

## 2013-02-13 LAB — CBC
HCT: 43.2 % (ref 36.0–46.0)
Hemoglobin: 14.7 g/dL (ref 12.0–15.0)
MCH: 32 pg (ref 26.0–34.0)
MCHC: 34 g/dL (ref 30.0–36.0)
MCV: 94.1 fL (ref 78.0–100.0)
PLATELETS: 287 10*3/uL (ref 150–400)
RBC: 4.59 MIL/uL (ref 3.87–5.11)
RDW: 14 % (ref 11.5–15.5)
WBC: 7.7 10*3/uL (ref 4.0–10.5)

## 2013-02-13 NOTE — Patient Instructions (Addendum)
FLEET ENEMA AM OF SURGERY - YOU MAY BUY AT GROCERY OR DRUG STORE   YOUR SURGERY IS SCHEDULED AT [facility]  ON:  Tuesday  2/17  REPORT TO  SHORT STAY CENTER AT:  5:30 AM      PHONE # FOR SHORT STAY IS [phone]  DO NOT EAT OR DRINK ANYTHING AFTER MIDNIGHT THE NIGHT BEFORE YOUR SURGERY.  YOU MAY BRUSH YOUR TEETH, RINSE OUT YOUR MOUTH--BUT NO WATER, NO FOOD, NO CHEWING GUM, NO MINTS, NO CANDIES, NO CHEWING TOBACCO.  PLEASE TAKE THE FOLLOWING MEDICATIONS THE AM OF YOUR SURGERY WITH A FEW SIPS OF WATER:  CLONAZEPAM, CYMBALTA, LEXAPRO, OXYBUTYNIN, OXYCODONE / ACETAMINOPHEN AND PREDNISONE. YOU MAY WEAR YOUR LIDOCAINE PATCH. PLEASE USE YOUR ALBUTEROL INHALER AND / OR NEBULIZER.   BRING ALBUTEROL INHALER TO [place].     DO NOT BRING VALUABLES, MONEY, CREDIT CARDS.  DO NOT WEAR JEWELRY, MAKE-UP, NAIL POLISH AND NO METAL PINS OR CLIPS IN YOUR HAIR. CONTACT LENS, DENTURES / PARTIALS, GLASSES SHOULD NOT BE WORN TO SURGERY AND IN MOST CASES-HEARING AIDS WILL NEED TO BE REMOVED.  BRING YOUR GLASSES CASE, ANY EQUIPMENT NEEDED FOR YOUR CONTACT LENS. FOR PATIENTS ADMITTED TO THE HOSPITAL--CHECK OUT TIME THE DAY OF DISCHARGE IS 11:00 AM.  ALL INPATIENT ROOMS ARE PRIVATE - WITH BATHROOM, TELEPHONE, TELEVISION AND WIFI INTERNET.   FAILURE TO FOLLOW THESE INSTRUCTIONS MAY RESULT IN THE CANCELLATION OF YOUR SURGERY. PLEASE BE AWARE THAT YOU MAY NEED ADDITIONAL BLOOD DRAWN DAY OF YOUR SURGERY  PATIENT SIGNATURE_________________________________

## 2013-02-13 NOTE — Pre-Procedure Instructions (Signed)
EKG REPORT IN EPIC FROM 10/26/12. CXR REPORT IN EPIC FROM 10/25/12. ANESTHESIOLOGIST'S CONSULT TODAY PER DR. ROSE.

## 2013-02-16 ENCOUNTER — Telehealth (INDEPENDENT_AMBULATORY_CARE_PROVIDER_SITE_OTHER): Payer: Self-pay

## 2013-02-16 ENCOUNTER — Ambulatory Visit (HOSPITAL_COMMUNITY): Payer: Self-pay | Admitting: Psychiatry

## 2013-02-16 NOTE — Telephone Encounter (Signed)
Pt is scheduled for diagnostic laparoscopy on 02/17/13 by Dr. Rosendo Gros.  She is calling today to cancel due to bad weather.  I asked her to call back when she is ready to reschedule.

## 2013-02-17 ENCOUNTER — Inpatient Hospital Stay (HOSPITAL_COMMUNITY): Admission: RE | Admit: 2013-02-17 | Payer: Medicare Other | Source: Ambulatory Visit | Admitting: General Surgery

## 2013-02-17 ENCOUNTER — Encounter (HOSPITAL_COMMUNITY): Admission: RE | Payer: Self-pay | Source: Ambulatory Visit

## 2013-02-17 SURGERY — ESOPHAGOMYOTOMY, LAPAROSCOPIC, HELLER
Anesthesia: General

## 2013-02-24 ENCOUNTER — Ambulatory Visit (HOSPITAL_COMMUNITY): Payer: Self-pay | Admitting: Psychiatry

## 2013-02-26 ENCOUNTER — Ambulatory Visit (HOSPITAL_COMMUNITY): Payer: Self-pay | Admitting: Psychiatry

## 2013-02-27 ENCOUNTER — Telehealth: Payer: Self-pay | Admitting: Family Medicine

## 2013-02-27 ENCOUNTER — Telehealth: Payer: Self-pay | Admitting: *Deleted

## 2013-02-27 MED ORDER — TIOTROPIUM BROMIDE MONOHYDRATE 18 MCG IN CAPS: 18.0000 ug | ORAL_CAPSULE | Freq: Every day | RESPIRATORY_TRACT | Status: DC

## 2013-02-27 MED ORDER — OXYBUTYNIN CHLORIDE ER 10 MG PO TB24: 10.0000 mg | ORAL_TABLET | Freq: Every day | ORAL | Status: DC

## 2013-02-27 MED ORDER — LOSARTAN POTASSIUM 50 MG PO TABS: 50.0000 mg | ORAL_TABLET | Freq: Every morning | ORAL | Status: DC

## 2013-02-27 MED ORDER — ALBUTEROL SULFATE HFA 108 (90 BASE) MCG/ACT IN AERS: 2.0000 | INHALATION_SPRAY | Freq: Four times a day (QID) | RESPIRATORY_TRACT | Status: DC | PRN

## 2013-02-27 MED ORDER — HYDROCHLOROTHIAZIDE 25 MG PO TABS: 25.0000 mg | ORAL_TABLET | Freq: Every morning | ORAL | Status: DC

## 2013-02-27 MED ORDER — FIRST-DUKES MOUTHWASH MT SUSP: 5.0000 mL | OROMUCOSAL | Status: DC | PRN

## 2013-02-27 MED ORDER — PREDNISONE 2.5 MG PO TABS: 7.5000 mg | ORAL_TABLET | Freq: Every day | ORAL | Status: DC

## 2013-02-27 MED ORDER — PRAVASTATIN SODIUM 40 MG PO TABS: 40.0000 mg | ORAL_TABLET | Freq: Every evening | ORAL | Status: DC

## 2013-02-27 NOTE — Telephone Encounter (Signed)
RX to pharmacy 

## 2013-02-27 NOTE — Telephone Encounter (Signed)
Faxed rx refill request from [facility] Propranolol 10 mg #90 f [id number] p [id number]

## 2013-02-27 NOTE — Telephone Encounter (Signed)
Medication refilled per protocol. 

## 2013-02-27 NOTE — Telephone Encounter (Signed)
Okay to refill magic mouth wash

## 2013-02-27 NOTE — Telephone Encounter (Signed)
PCP follows this pt and needs to be refilled by PCP.

## 2013-03-03 ENCOUNTER — Telehealth (INDEPENDENT_AMBULATORY_CARE_PROVIDER_SITE_OTHER): Payer: Self-pay | Admitting: *Deleted

## 2013-03-03 NOTE — Telephone Encounter (Signed)
As long as nothing big has changed since her last pre op that should be OK.  I don't want the same day pre op work to delay the case in any way  If there is a chance of a delay, than she has to do it before the surgery

## 2013-03-03 NOTE — Telephone Encounter (Signed)
Tonya Sullivan with Pre-op called to ask if you will allow for same day pre-op and surgery.  I explained that Dr. Rosendo Gros will have to answer that question so I will send him a message to ask.  Rise Paganini can be reached at [phone].

## 2013-03-04 NOTE — Telephone Encounter (Signed)
Spoke to [place] and gave her the below message.  She states Rise Paganini is not there today however she will pass the message to the appropriate person.

## 2013-03-06 ENCOUNTER — Telehealth: Payer: Self-pay | Admitting: *Deleted

## 2013-03-06 NOTE — Telephone Encounter (Signed)
Message copied by Sheral Flow on Fri Mar 06, 2013  2:08 PM ------      Message from: Lenore Manner      Created: Fri Mar 06, 2013  1:53 PM      Regarding: Sores in mouth       Contact: [phone]       Pt is having sore in gums, and she has been using a mouth wash  ------

## 2013-03-06 NOTE — Telephone Encounter (Signed)
Appointment scheduled.

## 2013-03-06 NOTE — Telephone Encounter (Signed)
Received call from patient.   Reported that her gums are sore and she has open area on the front bottom gum. States that she has not been able to wear dentures. States that the sore areas have been ongoing since October 2014.  Patient reports that she is using Magic Mouthwash with no relief.   Patient is scheduled to have laparoscopic surgery of esophagus on 03/17/2013 and she is worried about the tube making sore areas worse.   MD please advise.

## 2013-03-06 NOTE — Telephone Encounter (Signed)
Have her come in so I can evaluate this, I am not sure what she is referring to

## 2013-03-08 ENCOUNTER — Encounter (HOSPITAL_COMMUNITY): Payer: Self-pay | Admitting: Emergency Medicine

## 2013-03-08 ENCOUNTER — Emergency Department (HOSPITAL_COMMUNITY)
Admission: EM | Admit: 2013-03-08 | Discharge: 2013-03-08 | Disposition: A | Discharge status: Home / Self Care | Payer: Medicare Other | Attending: Emergency Medicine | Admitting: Emergency Medicine

## 2013-03-08 DIAGNOSIS — Z79899 Other long term (current) drug therapy: Secondary | ICD-10-CM | POA: Insufficient documentation

## 2013-03-08 DIAGNOSIS — Z9889 Other specified postprocedural states: Secondary | ICD-10-CM | POA: Insufficient documentation

## 2013-03-08 DIAGNOSIS — M5137 Other intervertebral disc degeneration, lumbosacral region: Secondary | ICD-10-CM | POA: Insufficient documentation

## 2013-03-08 DIAGNOSIS — E2749 Other adrenocortical insufficiency: Secondary | ICD-10-CM | POA: Insufficient documentation

## 2013-03-08 DIAGNOSIS — Z85820 Personal history of malignant melanoma of skin: Secondary | ICD-10-CM | POA: Insufficient documentation

## 2013-03-08 DIAGNOSIS — I10 Essential (primary) hypertension: Secondary | ICD-10-CM | POA: Insufficient documentation

## 2013-03-08 DIAGNOSIS — M549 Dorsalgia, unspecified: Secondary | ICD-10-CM | POA: Insufficient documentation

## 2013-03-08 DIAGNOSIS — R42 Dizziness and giddiness: Secondary | ICD-10-CM | POA: Insufficient documentation

## 2013-03-08 DIAGNOSIS — G8929 Other chronic pain: Secondary | ICD-10-CM | POA: Insufficient documentation

## 2013-03-08 DIAGNOSIS — F17200 Nicotine dependence, unspecified, uncomplicated: Secondary | ICD-10-CM | POA: Insufficient documentation

## 2013-03-08 DIAGNOSIS — K219 Gastro-esophageal reflux disease without esophagitis: Secondary | ICD-10-CM | POA: Insufficient documentation

## 2013-03-08 DIAGNOSIS — M51379 Other intervertebral disc degeneration, lumbosacral region without mention of lumbar back pain or lower extremity pain: Secondary | ICD-10-CM | POA: Insufficient documentation

## 2013-03-08 DIAGNOSIS — F319 Bipolar disorder, unspecified: Secondary | ICD-10-CM | POA: Insufficient documentation

## 2013-03-08 DIAGNOSIS — E785 Hyperlipidemia, unspecified: Secondary | ICD-10-CM | POA: Insufficient documentation

## 2013-03-08 DIAGNOSIS — J4489 Other specified chronic obstructive pulmonary disease: Secondary | ICD-10-CM | POA: Insufficient documentation

## 2013-03-08 DIAGNOSIS — R109 Unspecified abdominal pain: Secondary | ICD-10-CM | POA: Insufficient documentation

## 2013-03-08 DIAGNOSIS — F411 Generalized anxiety disorder: Secondary | ICD-10-CM | POA: Insufficient documentation

## 2013-03-08 DIAGNOSIS — E063 Autoimmune thyroiditis: Secondary | ICD-10-CM | POA: Insufficient documentation

## 2013-03-08 DIAGNOSIS — Z8673 Personal history of transient ischemic attack (TIA), and cerebral infarction without residual deficits: Secondary | ICD-10-CM | POA: Insufficient documentation

## 2013-03-08 DIAGNOSIS — IMO0002 Reserved for concepts with insufficient information to code with codable children: Secondary | ICD-10-CM | POA: Insufficient documentation

## 2013-03-08 DIAGNOSIS — J449 Chronic obstructive pulmonary disease, unspecified: Secondary | ICD-10-CM | POA: Insufficient documentation

## 2013-03-08 DIAGNOSIS — M545 Low back pain, unspecified: Secondary | ICD-10-CM | POA: Insufficient documentation

## 2013-03-08 DIAGNOSIS — Z8701 Personal history of pneumonia (recurrent): Secondary | ICD-10-CM | POA: Insufficient documentation

## 2013-03-08 MED ORDER — HYDROMORPHONE HCL PF 1 MG/ML IJ SOLN
2.0000 mg | Freq: Once | INTRAMUSCULAR | Status: AC
  Administered 2013-03-08: 2 mg via INTRAVENOUS
  Filled 2013-03-08: qty 2

## 2013-03-08 NOTE — ED Provider Notes (Signed)
CSN: [id number]     Arrival date & time 03/08/13  0102 History  This chart was scribed for Leota Jacobsen, MD by Zettie Pho, ED Scribe. This patient was seen in room APA14/APA14 and the patient's care was started at 7:19 PM.    Chief Complaint  Patient presents with  . Back Pain  . Dizziness   The history is provided by the patient. No language interpreter was used.   HPI Comments: Tonya Sullivan is a 58 y.o. Female with a history of chronic back pain, degenerative disc disease, lumbar spinal stenosis, and neuropathic pain who presents to the Emergency Department complaining of a constant pain to the lower back that she states is chronic in nature, but has been progressively worsening today. She denies any potential injury or trauma to the area. She states that the pain is exacerbated with walking/bearing weight. Patient reports some associated, diffuse weakness secondary to pain. She reports taking 10 mg of oxycodone and applying a lidocaine patch, back brace, and heating pad to the area without significant relief. She denies bowel or bladder incontinence, hematuria, numbness. Patient is followed by Dr. Lily Lovings for her pain management. Patient has a surgical history of anterior cervical decomp/discectomy fusion and back surgery. Patient also has a history of COPD, hyperlipidemia, HTN, carotid artery calcification, thyroid disease, autoimmune thyroiditis, TIA, stroke, Addison's disease, and achalasia.   Past Medical History  Diagnosis Date  . COPD (chronic obstructive pulmonary disease)   . Depression   . Chronic back pain     DDD, disc bulge, radiculopathy, spinal stenosis  . Hyperlipidemia   . Anxiety   . Bipolar disorder, unspecified   . Carotid artery calcification   . DDD (degenerative disc disease), lumbosacral   . Spinal stenosis, lumbar   . Melanoma     removed 1 week ago at Dr Teryl Lucy office  . Thyroid disease   . Autoimmune thyroiditis   . Hypertension   . Dysrhythmia      palpitations  . TIA (transient ischemic attack)     august 2014  . GERD (gastroesophageal reflux disease)   . H/O dizziness   . Neuropathic pain   . Adrenal gland dysfunction     Addison's disease ON DAILY PREDNISONE  . Addison's disease   . Stroke     HX OF MINI STROKE AUG 2014- DROPPIN EVERYTHING, SPEECH SLURRED, MEMORY AFFECTED --ALL RESOLVED - NO DEFICITS NOW  . Pneumonia     LAST PNEUMONIA WAS AUG 2014  . Achalasia    Past Surgical History  Procedure Laterality Date  . Appendectomy    . Tonsillectomy    . Cholecystectomy    . Lump left breast      removed- benign  . Abdominal hysterectomy      tubal pregnancy  . Inguinal hernia repair    . Stomach surgery      ?holes in esophgous  . Ectopic pregnancy surgery    . Back surgery      x 5;1984;1989;;1999;2000;2010  . Colonoscopy  01/25/2012    Procedure: COLONOSCOPY;  Surgeon: Rogene Houston, MD;  Location: [facility];  Service: Endoscopy;  Laterality: N/A;  130  . Esophagogastroduodenoscopy (egd) with esophageal dilation N/A 08/13/2012    Procedure: ESOPHAGOGASTRODUODENOSCOPY (EGD) WITH ESOPHAGEAL DILATION;  Surgeon: Rogene Houston, MD;  Location: [facility];  Service: Endoscopy;  Laterality: N/A;  315  . Breast surgery      left breast  . Anterior cervical decomp/discectomy fusion N/A  10/21/2012    Procedure: Cervical Six-Seven Anterior cervical decompression/diskectomy/fusion;  Surgeon: Kristeen Miss, MD;  Location: [facility];  Service: Neurosurgery;  Laterality: N/A;  Cervical Six-Seven Anterior cervical decompression/diskectomy/fusion  . Cataract extraction w/phaco Right 11/25/2012    Procedure: CATARACT EXTRACTION PHACO AND INTRAOCULAR LENS PLACEMENT (IOC);  Surgeon: Elta Guadeloupe T. Gershon Crane, MD;  Location: [facility];  Service: Ophthalmology;  Laterality: Right;  CDE:10.06  . Cataract extraction w/phaco Left 12/09/2012    Procedure: CATARACT EXTRACTION PHACO AND INTRAOCULAR LENS PLACEMENT (IOC);  Surgeon: Elta Guadeloupe T. Gershon Crane,  MD;  Location: [facility];  Service: Ophthalmology;  Laterality: Left;  CDE:5.06   Family History  Problem Relation Age of Onset  . Hypertension Mother   . Heart disease Mother   . Heart attack Mother   . Anxiety disorder Mother   . Cancer Father     lung cancer  . Heart attack Father   . Alcohol abuse Father   . Hypertension Brother   . Alcohol abuse Brother   . Bipolar disorder Brother   . ADD / ADHD Brother   . Drug abuse Brother   . OCD Brother   . Alcohol abuse Sister   . Bipolar disorder Sister   . Anxiety disorder Sister   . ADD / ADHD Sister   . Drug abuse Sister   . Physical abuse Sister   . Sexual abuse Sister   . Alcohol abuse Brother   . Bipolar disorder Brother   . ADD / ADHD Brother   . Drug abuse Brother   . Alcohol abuse Sister   . Bipolar disorder Sister   . Anxiety disorder Sister   . ADD / ADHD Sister   . Physical abuse Sister   . Sexual abuse Sister   . Alcohol abuse Brother   . Bipolar disorder Brother   . ADD / ADHD Brother   . Bipolar disorder Brother   . ADD / ADHD Brother   . Alcohol abuse Brother   . Paranoid behavior Brother   . Physical abuse Brother   . Sexual abuse Brother   . Bipolar disorder Maternal Aunt   . Bipolar disorder Paternal Aunt   . Bipolar disorder Maternal Uncle   . Bipolar disorder Paternal Uncle   . Bipolar disorder Maternal Grandfather   . Alcohol abuse Maternal Grandfather   . Bipolar disorder Maternal Grandmother   . Alcohol abuse Maternal Grandmother   . Anxiety disorder Maternal Grandmother   . Dementia Maternal Grandmother   . Bipolar disorder Paternal Grandfather   . Alcohol abuse Paternal Grandfather   . Bipolar disorder Paternal Grandmother   . Alcohol abuse Paternal Grandmother   . Drug abuse Paternal Grandmother   . Bipolar disorder Maternal Uncle   . Schizophrenia Neg Hx   . Seizures Neg Hx    History  Substance Use Topics  . Smoking status: Current Every Day Smoker -- 0.50 packs/day for 40 years     Types: Cigarettes  . Smokeless tobacco: Never Used     Comment: 6 or 7 cigs a day as of 05/21/2012  . Alcohol Use: No   OB History   Grav Para Term Preterm Abortions TAB SAB Ect Mult Living   2 1 1  1   1  1      Review of Systems  Genitourinary: Negative for hematuria.  Musculoskeletal: Positive for back pain.  Neurological: Positive for weakness. Negative for numbness.      Allergies  Codeine; Cyclobenzaprine; Darvocet; Abilify; Ace inhibitors;  Adhesive; Gabapentin; Iodine; Levofloxacin; Sulfa antibiotics; and Robaxin  Home Medications   Current Outpatient Rx  Name  Route  Sig  Dispense  Refill  . albuterol (PROAIR HFA) 108 (90 BASE) MCG/ACT inhaler   Inhalation   Inhale 2 puffs into the lungs every 6 (six) hours as needed for wheezing or shortness of breath.   1 Inhaler   3   . albuterol (PROVENTIL) (2.5 MG/3ML) 0.083% nebulizer solution   Nebulization   Take 3 mLs (2.5 mg total) by nebulization every 4 (four) hours as needed for wheezing.   75 mL   3   . amitriptyline (ELAVIL) 100 MG tablet   Oral   Take 1 tablet (100 mg total) by mouth at bedtime.   30 tablet   2   . baclofen (LIORESAL) 10 MG tablet   Oral   Take 10 mg by mouth 2 (two) times daily.         . calcium carbonate (OS-CAL) 1250 MG chewable tablet   Oral   Chew 1 tablet by mouth daily as needed for heartburn.         . clonazePAM (KLONOPIN) 1 MG tablet   Oral   Take 1 tablet (1 mg total) by mouth 4 (four) times daily.   120 tablet   2   . Diphenhyd-Hydrocort-Nystatin (FIRST-DUKES MOUTHWASH) SUSP   Mouth/Throat   Use as directed 5 mLs in the mouth or throat every 4 (four) hours as needed.   1 Bottle   2   . DULoxetine (CYMBALTA) 60 MG capsule   Oral   Take 1 capsule (60 mg total) by mouth every morning.   30 capsule   2   . escitalopram (LEXAPRO) 20 MG tablet   Oral   Take 1 tablet (20 mg total) by mouth every morning.   30 tablet   2   . escitalopram (LEXAPRO) 20 MG  tablet   Oral   Take 1 tablet (20 mg total) by mouth every morning.   30 tablet   2   . hydrochlorothiazide (HYDRODIURIL) 25 MG tablet   Oral   Take 1 tablet (25 mg total) by mouth every morning.   30 tablet   2   . lidocaine (LIDODERM) 5 %   Transdermal   Place 1 patch onto the skin every 12 (twelve) hours. Remove & Discard patch within 12 hours or as directed by MD WEARS PATCH ON LOWER BACK FOR BACK PAIN         . losartan (COZAAR) 50 MG tablet   Oral   Take 1 tablet (50 mg total) by mouth every morning.   30 tablet   2   . methocarbamol (ROBAXIN) 500 MG tablet   Oral   Take 500 mg by mouth 2 (two) times daily.         Marland Kitchen oxybutynin (DITROPAN XL) 10 MG 24 hr tablet   Oral   Take 1 tablet (10 mg total) by mouth daily.   30 tablet   2   . oxyCODONE-acetaminophen (PERCOCET) 10-325 MG per tablet   Oral   Take 1 tablet by mouth every 8 (eight) hours as needed for pain.          . pravastatin (PRAVACHOL) 40 MG tablet   Oral   Take 1 tablet (40 mg total) by mouth every evening.   30 tablet   2   . predniSONE (DELTASONE) 2.5 MG tablet   Oral   Take 3 tablets (7.5  mg total) by mouth daily with breakfast.   90 tablet   2   . propranolol (INDERAL) 10 MG tablet               . tiotropium (SPIRIVA) 18 MCG inhalation capsule   Inhalation   Place 1 capsule (18 mcg total) into inhaler and inhale daily. USES IN THE EVENING   30 capsule   2    Triage Vitals: BP 117/76  Pulse 71  Temp(Src) 98.4 F (36.9 C) (Oral)  Resp 20  Ht 5\' 7"  (1.702 m)  Wt 196 lb (88.905 kg)  BMI 30.69 kg/m2  SpO2 98%  Physical Exam  Nursing note and vitals reviewed. Constitutional: She is oriented to person, place, and time. She appears well-developed and well-nourished.  Non-toxic appearance. No distress.  HENT:  Head: Normocephalic and atraumatic.  Eyes: Conjunctivae, EOM and lids are normal. Pupils are equal, round, and reactive to light.  Neck: Normal range of motion. Neck  supple. No tracheal deviation present. No mass present.  Cardiovascular: Normal rate, regular rhythm and normal heart sounds.  Exam reveals no gallop.   No murmur heard. Pulmonary/Chest: Effort normal and breath sounds normal. No stridor. No respiratory distress. She has no decreased breath sounds. She has no wheezes. She has no rhonchi. She has no rales.  Abdominal: Soft. Normal appearance and bowel sounds are normal. She exhibits no distension. There is no tenderness. There is no rebound and no CVA tenderness.  Musculoskeletal: Normal range of motion. She exhibits no edema and no tenderness.  Tenderness to palpation to the mid-lumbar spine.   Neurological: She is alert and oriented to person, place, and time. She has normal strength. No cranial nerve deficit or sensory deficit. GCS eye subscore is 4. GCS verbal subscore is 5. GCS motor subscore is 6.  Normal gait without ataxia.   Skin: Skin is warm and dry. No abrasion and no rash noted.  Psychiatric: She has a normal mood and affect. Her speech is normal and behavior is normal.    ED Course  Procedures (including critical care time)  DIAGNOSTIC STUDIES: Oxygen Saturation is 98% on room air, normal by my interpretation.    COORDINATION OF CARE: 7:22 PM- Will order pain medication to manage symptoms and advised patient to follow up with her pain specialist. Discussed treatment plan with patient at bedside and patient verbalized agreement.     Labs Review Labs Reviewed - No data to display Imaging Review No results found.   EKG Interpretation None      MDM   Final diagnoses:  None    Pt given dilaudid for pain and will see her pain management md  I personally performed the services described in this documentation, which was scribed in my presence. The recorded information has been reviewed and is accurate.      Leota Jacobsen, MD 03/08/13 [id number]

## 2013-03-08 NOTE — ED Notes (Signed)
Patient c/o chronic lower back pain that started to progressively get worse today. Patient reports taking oxycodone 10mg , using lidocaine patch, back brace, and heating pad with no relief. Patient also reports feeling dizziness today. Denies headache or weakness.

## 2013-03-08 NOTE — Discharge Instructions (Signed)

## 2013-03-09 ENCOUNTER — Encounter (HOSPITAL_COMMUNITY): Payer: Self-pay | Admitting: Pharmacy Technician

## 2013-03-09 ENCOUNTER — Encounter (INDEPENDENT_AMBULATORY_CARE_PROVIDER_SITE_OTHER): Payer: Self-pay

## 2013-03-10 ENCOUNTER — Other Ambulatory Visit: Payer: Self-pay | Admitting: Family Medicine

## 2013-03-10 DIAGNOSIS — Z1211 Encounter for screening for malignant neoplasm of colon: Secondary | ICD-10-CM

## 2013-03-10 DIAGNOSIS — Z1231 Encounter for screening mammogram for malignant neoplasm of breast: Secondary | ICD-10-CM

## 2013-03-11 ENCOUNTER — Ambulatory Visit (INDEPENDENT_AMBULATORY_CARE_PROVIDER_SITE_OTHER): Payer: Medicare Other | Admitting: Psychiatry

## 2013-03-11 ENCOUNTER — Encounter: Payer: Self-pay | Admitting: Family Medicine

## 2013-03-11 ENCOUNTER — Ambulatory Visit (INDEPENDENT_AMBULATORY_CARE_PROVIDER_SITE_OTHER): Payer: Medicare Other | Admitting: Family Medicine

## 2013-03-11 VITALS — BP 132/84 | HR 62 | Temp 98.6°F | Resp 18 | Ht 65.0 in | Wt 203.0 lb

## 2013-03-11 DIAGNOSIS — Z720 Tobacco use: Secondary | ICD-10-CM

## 2013-03-11 DIAGNOSIS — E2740 Unspecified adrenocortical insufficiency: Secondary | ICD-10-CM

## 2013-03-11 DIAGNOSIS — F17200 Nicotine dependence, unspecified, uncomplicated: Secondary | ICD-10-CM

## 2013-03-11 DIAGNOSIS — I10 Essential (primary) hypertension: Secondary | ICD-10-CM

## 2013-03-11 DIAGNOSIS — J449 Chronic obstructive pulmonary disease, unspecified: Secondary | ICD-10-CM

## 2013-03-11 DIAGNOSIS — E2749 Other adrenocortical insufficiency: Secondary | ICD-10-CM

## 2013-03-11 DIAGNOSIS — F319 Bipolar disorder, unspecified: Secondary | ICD-10-CM

## 2013-03-11 DIAGNOSIS — K120 Recurrent oral aphthae: Secondary | ICD-10-CM

## 2013-03-11 MED ORDER — LIDOCAINE VISCOUS 2 % MT SOLN: 15.0000 mL | OROMUCOSAL | Status: DC | PRN

## 2013-03-11 MED ORDER — ALBUTEROL SULFATE (2.5 MG/3ML) 0.083% IN NEBU: 2.5000 mg | INHALATION_SOLUTION | RESPIRATORY_TRACT | Status: DC | PRN

## 2013-03-11 MED ORDER — FLUOCINONIDE 0.05 % EX GEL: 1.0000 "application " | Freq: Three times a day (TID) | CUTANEOUS | Status: DC

## 2013-03-11 NOTE — Assessment & Plan Note (Signed)
She has cut back on tobacco down to  1/2ppd

## 2013-03-11 NOTE — Assessment & Plan Note (Signed)
Will have her apply Lidex gel to the ulcers have also given her viscous lidocaine

## 2013-03-11 NOTE — Progress Notes (Signed)
   THERAPIST PROGRESS NOTE  Session Time: Wednesday 03/11/2013 11:00 AM - 11:50 AM  Participation Level: Active  Behavioral Response: CasualAlertAnxious  Type of Therapy: Individual Therapy  Treatment Goals Addressed: Improve ability to manage stress and anxiety using coping skills and relaxation techniques  Interventions: CBT and Supportive  Summary: Tonya Sullivan is a 58 y.o. female who presents with a long-standing history of depression beginning in adolescence. Patient also has significant trauma history. Patient also experienced grief and loss issues due to to the death of her husband in 2010-09-19. She continues to experience significant anxiety and stress related to ongoing health issues as well as family problems. She is scheduled to have esophageal surgery on 03/17/2013 and is worried about possible risks. She expresses frustration with herself as she has not eliminated smoking but has reduced use to one half pack per day. She reports additional stress related to her son recently obtaining legal and physical custody of his 42 year old daughter whom he has not seen since she was 38 weeks old per patient's report. Patient is experiencing internal conflict as she is concerned about her granddaughter's relationship with her mother and expresses frustration son has not obtain counseling for granddaughter .She reports increased contact with her son and his family but expresses worry about her son's girlfriend sharing information from patient with patient's son.   Suicidal/Homicidal: No  Therapist Response: Therapist works with patient to review and update treatment plan, identify and verbalize feelings, identify triggers of stress and smoking, identify coping statements, and practice relaxation technique using visualization.  Plan: Return again in 3-4 weeks.  Diagnosis: Axis I: Bipolar disorder    Axis II: Deferred    Aurora Rody, LCSW 03/11/2013

## 2013-03-11 NOTE — Patient Instructions (Signed)
Use new gel to mouth ulcers Use lidocaine for pain swish and spit Stop magic mouthwash F/U in April

## 2013-03-11 NOTE — Assessment & Plan Note (Signed)
Blood pressure well controlled

## 2013-03-11 NOTE — Assessment & Plan Note (Signed)
Currently stable on current medications. No recent exacerbation. I think she can be cleared to proceed with surgery

## 2013-03-11 NOTE — Progress Notes (Signed)
Patient ID: Tonya Sullivan, female   DOB: 06-30-1955, 58 y.o.   MRN: [id number]   Subjective:    Patient ID: Tonya Sullivan, female    DOB: [birth year], 58 y.o.   MRN: [id number]  Patient presents for Sore Gums  patient here with soreness in her gums states that she has a little bumps along the bottom rate she is unable to wear her dentures. She has been using Magic mouthwash without any improvement. She is scheduled to have surgery on her esophagus with possible flap repair. Her breathing has been good and she's taking her medications as prescribed.  She still having difficulty with her children and this is causing her more anxiety and stress. She is following with her psychiatrist on a regular basis.    Review Of Systems: - per above   GEN- denies fatigue, fever, weight loss,weakness, recent illness HEENT- denies eye drainage, change in vision, nasal discharge, CVS- denies chest pain, palpitations RESP- denies SOB, cough, wheeze Neuro- denies headache, dizziness, syncope, seizure activity       Objective:    BP 132/84  Pulse 62  Temp(Src) 98.6 F (37 C)  Resp 18  Ht 5\' 5"  (1.651 m)  Wt 203 lb (92.08 kg)  BMI 33.78 kg/m2 GEN- NAD, alert and oriented x3 HEENT- PERRL, EOMI, non injected sclera, pink conjunctiva, MMM, lower gumline- 2 gray based apthous ulcers seen, no vesicles Neck- Supple, no LAD CVS- RRR, no murmur RESP-CTAB EXT- No edema, edematous face Pulses- Radial, DP- 2+  Subjective:    Patient ID: , female    DOB: 05-Nov-1955, 58 y.o.   MRN: [id number]  HPI  Review of Systems -        Objective:   Physical Exam       Assessment & Plan:          Assessment & Plan:      Problem List Items Addressed This Visit   None      Note: This dictation was prepared with Dragon dictation along with smaller phrase technology. Any transcriptional errors that result from this process are unintentional.

## 2013-03-11 NOTE — Assessment & Plan Note (Addendum)
Continue f/u with endocrinology, will need intense monitoring after surgery

## 2013-03-11 NOTE — Patient Instructions (Signed)
Discussed orally 

## 2013-03-12 ENCOUNTER — Encounter (HOSPITAL_COMMUNITY): Payer: Self-pay

## 2013-03-12 LAB — CBC
HEMATOCRIT: 39.2 % (ref 36.0–46.0)
HEMOGLOBIN: 13.9 g/dL (ref 12.0–15.0)
MCH: 31.6 pg (ref 26.0–34.0)
MCHC: 35.5 g/dL (ref 30.0–36.0)
MCV: 89.1 fL (ref 78.0–100.0)
Platelets: 285 10*3/uL (ref 150–400)
RBC: 4.4 MIL/uL (ref 3.87–5.11)
RDW: 13.3 % (ref 11.5–15.5)
WBC: 7.6 10*3/uL (ref 4.0–10.5)

## 2013-03-12 LAB — COMPREHENSIVE METABOLIC PANEL
ALK PHOS: 60 U/L (ref 39–117)
ALT: 13 U/L (ref 0–35)
AST: 19 U/L (ref 0–37)
Albumin: 3.9 g/dL (ref 3.5–5.2)
BUN: 8 mg/dL (ref 6–23)
CALCIUM: 9.3 mg/dL (ref 8.4–10.5)
CO2: 31 mEq/L (ref 19–32)
Chloride: 77 mEq/L — ABNORMAL LOW (ref 96–112)
Creatinine, Ser: 0.81 mg/dL (ref 0.50–1.10)
GFR calc Af Amer: >90 mL/min (ref >90)
GFR calc non Af Amer: 79 mL/min — ABNORMAL LOW (ref >90)
Glucose, Bld: 95 mg/dL (ref 70–99)
Potassium: 3.9 mEq/L (ref 3.7–5.3)
Sodium: 120 mEq/L — CL (ref 137–147)
TOTAL PROTEIN: 7.2 g/dL (ref 6.0–8.3)
Total Bilirubin: 0.5 mg/dL (ref 0.3–1.2)

## 2013-03-12 NOTE — Patient Instructions (Signed)
      Your procedure is scheduled on:  03/17/13  Tuesday   Report to [facility] at       AM.  Call this number if you have problems the morning of surgery: [place] per rectum  Monday NIGHT  Do not eat food  Or drink :After Midnight.MONDAY NIGHT   Take these medicines the morning of surgery with A SIP OF WATER:PROPRANOLOL, Cymbalta, Clonazepam, Lexapro, Prednisone May take Percocet, use Albuterol, Proair [place]   .  Contacts, dentures or partial plates, or metal hairpins  can not be worn to surgery. Your family will be responsible for glasses, dentures, hearing aides while you are in surgery  Leave suitcase in the car. After surgery it may be brought to your room.  For patients admitted to the hospital, checkout time is 11:00 AM day of  discharge.                DO NOT WEAR JEWELRY, LOTIONS, POWDERS, OR PERFUMES.  WOMEN-- DO NOT SHAVE LEGS OR UNDERARMS FOR 48 HOURS BEFORE SHOWERS. MEN MAY SHAVE FACE.  Patients discharged the day of surgery will not be allowed to drive home. IF going home the day of surgery, you must have a driver and someone to stay with you for the first 24 hours  Name and phone number of your driver:        [place]                                                  Patient Signature _____________________________

## 2013-03-12 NOTE — Progress Notes (Signed)
SPOKE WITH CHRISTY AT DR Rosendo Gros OFFICE-- NOTIFIED OF CRITICAL SODIUM.Marland Kitchen STATED SHE WOULD SEND TEXT TO DR Rosendo Gros TO REVIEW THIS CRITICAL LAB

## 2013-03-12 NOTE — Progress Notes (Signed)
Chest, EKG 10/14 EPIC, eccho 8/14 EPIC

## 2013-03-12 NOTE — Progress Notes (Signed)
At PST visit today pt stated was seen by anesthesia here at 02/13/13 visit

## 2013-03-13 ENCOUNTER — Telehealth (INDEPENDENT_AMBULATORY_CARE_PROVIDER_SITE_OTHER): Payer: Self-pay

## 2013-03-13 ENCOUNTER — Emergency Department (HOSPITAL_COMMUNITY)
Admission: EM | Admit: 2013-03-13 | Discharge: 2013-03-13 | Disposition: A | Discharge status: Home / Self Care | Payer: No Typology Code available for payment source | Attending: Emergency Medicine | Admitting: Emergency Medicine

## 2013-03-13 ENCOUNTER — Encounter (HOSPITAL_COMMUNITY)
Admission: RE | Admit: 2013-03-13 | Discharge: 2013-03-13 | Disposition: A | Discharge status: Home / Self Care | Payer: Medicare Other | Source: Ambulatory Visit | Attending: General Surgery | Admitting: General Surgery

## 2013-03-13 ENCOUNTER — Ambulatory Visit (INDEPENDENT_AMBULATORY_CARE_PROVIDER_SITE_OTHER): Payer: Medicare Other | Admitting: Psychiatry

## 2013-03-13 ENCOUNTER — Other Ambulatory Visit (INDEPENDENT_AMBULATORY_CARE_PROVIDER_SITE_OTHER): Payer: Self-pay

## 2013-03-13 ENCOUNTER — Encounter (HOSPITAL_COMMUNITY): Payer: Self-pay | Admitting: Emergency Medicine

## 2013-03-13 ENCOUNTER — Encounter (HOSPITAL_COMMUNITY): Payer: Self-pay | Admitting: Psychiatry

## 2013-03-13 VITALS — BP 90/60 | Ht 67.0 in | Wt 202.0 lb

## 2013-03-13 DIAGNOSIS — Y92410 Unspecified street and highway as the place of occurrence of the external cause: Secondary | ICD-10-CM | POA: Insufficient documentation

## 2013-03-13 DIAGNOSIS — F319 Bipolar disorder, unspecified: Secondary | ICD-10-CM

## 2013-03-13 DIAGNOSIS — Z01812 Encounter for preprocedural laboratory examination: Secondary | ICD-10-CM | POA: Insufficient documentation

## 2013-03-13 DIAGNOSIS — S46909A Unspecified injury of unspecified muscle, fascia and tendon at shoulder and upper arm level, unspecified arm, initial encounter: Secondary | ICD-10-CM | POA: Insufficient documentation

## 2013-03-13 DIAGNOSIS — E871 Hypo-osmolality and hyponatremia: Secondary | ICD-10-CM

## 2013-03-13 DIAGNOSIS — S199XXA Unspecified injury of neck, initial encounter: Secondary | ICD-10-CM

## 2013-03-13 DIAGNOSIS — S0990XA Unspecified injury of head, initial encounter: Secondary | ICD-10-CM | POA: Insufficient documentation

## 2013-03-13 DIAGNOSIS — J4489 Other specified chronic obstructive pulmonary disease: Secondary | ICD-10-CM | POA: Insufficient documentation

## 2013-03-13 DIAGNOSIS — IMO0002 Reserved for concepts with insufficient information to code with codable children: Secondary | ICD-10-CM | POA: Insufficient documentation

## 2013-03-13 DIAGNOSIS — S0993XA Unspecified injury of face, initial encounter: Secondary | ICD-10-CM | POA: Insufficient documentation

## 2013-03-13 DIAGNOSIS — F411 Generalized anxiety disorder: Secondary | ICD-10-CM

## 2013-03-13 DIAGNOSIS — F332 Major depressive disorder, recurrent severe without psychotic features: Secondary | ICD-10-CM

## 2013-03-13 DIAGNOSIS — R899 Unspecified abnormal finding in specimens from other organs, systems and tissues: Secondary | ICD-10-CM

## 2013-03-13 DIAGNOSIS — I10 Essential (primary) hypertension: Secondary | ICD-10-CM | POA: Insufficient documentation

## 2013-03-13 DIAGNOSIS — Y9389 Activity, other specified: Secondary | ICD-10-CM | POA: Insufficient documentation

## 2013-03-13 DIAGNOSIS — J449 Chronic obstructive pulmonary disease, unspecified: Secondary | ICD-10-CM | POA: Insufficient documentation

## 2013-03-13 DIAGNOSIS — S4980XA Other specified injuries of shoulder and upper arm, unspecified arm, initial encounter: Secondary | ICD-10-CM | POA: Insufficient documentation

## 2013-03-13 LAB — COMPREHENSIVE METABOLIC PANEL
ALT: 12 U/L (ref 0–35)
AST: 17 U/L (ref 0–37)
Albumin: 3.9 g/dL (ref 3.5–5.2)
Alkaline Phosphatase: 62 U/L (ref 39–117)
BUN: 8 mg/dL (ref 6–23)
CALCIUM: 9.7 mg/dL (ref 8.4–10.5)
CHLORIDE: 76 meq/L — AB (ref 96–112)
CO2: 31 meq/L (ref 19–32)
Creatinine, Ser: 0.95 mg/dL (ref 0.50–1.10)
GFR calc Af Amer: 76 mL/min — ABNORMAL LOW (ref >90)
GFR, EST NON AFRICAN AMERICAN: 65 mL/min — AB (ref >90)
Glucose, Bld: 120 mg/dL — ABNORMAL HIGH (ref 70–99)
POTASSIUM: 4 meq/L (ref 3.7–5.3)
SODIUM: 121 meq/L — AB (ref 137–147)
Total Bilirubin: 0.4 mg/dL (ref 0.3–1.2)
Total Protein: 7.6 g/dL (ref 6.0–8.3)

## 2013-03-13 NOTE — Progress Notes (Signed)
SODIUM 121 WITH REPEAT LABS TODAY RESULT CALLED TO GLENDA KENDRICK

## 2013-03-13 NOTE — Telephone Encounter (Signed)
Called and spoke to [place] @ Pre-Admit regarding Ms. Nicholl's Low Sodium Levels.  Per Dr. Rosendo Gros we will re-draw labs the morning of surgery.

## 2013-03-13 NOTE — Telephone Encounter (Signed)
Patient states she is drinking a lot of water , advised her to start drinking Gatorade instead of water and she will have her labs drawn the day of surgery. Patient verbalized understanding

## 2013-03-13 NOTE — Telephone Encounter (Signed)
Calling to speak with Magda Paganini at [facility] regarding abnormal labs on patient that is scheduled for surgery on 03/17/13.  Per Dr. Rosendo Gros patient need's to have labs redrawn today.  Will call patient to make her aware of the above.

## 2013-03-13 NOTE — Progress Notes (Signed)
Tonya Sullivan ,CMA called back from [facility] and stated Dr Rosendo Gros will repeat CMP am of surgery.
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
## 2013-03-13 NOTE — Telephone Encounter (Signed)
Called and left message for patient to call our office RE:  Patient Sodium still low, per Dr. Rosendo Gros patient need's to drink Gatorade.  See if patient is drinking water excessively?  If so, patient need's to decrease water intake and drink gatorade.  Will repeat labs morning of surgery.

## 2013-03-13 NOTE — Progress Notes (Signed)
Called [facility] [place] [facility] office and left message for [employer], is patient coming back to pst for repeat labs today?
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
## 2013-03-13 NOTE — Progress Notes (Signed)
Labs routed via EPIC to Dr Rosendo Gros.

## 2013-03-14 ENCOUNTER — Encounter (HOSPITAL_COMMUNITY): Payer: Self-pay | Admitting: Emergency Medicine

## 2013-03-14 ENCOUNTER — Emergency Department (HOSPITAL_COMMUNITY): Payer: Medicare Other

## 2013-03-14 ENCOUNTER — Emergency Department (HOSPITAL_COMMUNITY)
Admission: EM | Admit: 2013-03-14 | Discharge: 2013-03-14 | Disposition: A | Discharge status: Home / Self Care | Payer: Medicare Other | Source: Home / Self Care | Attending: Emergency Medicine | Admitting: Emergency Medicine

## 2013-03-14 DIAGNOSIS — M7918 Myalgia, other site: Secondary | ICD-10-CM

## 2013-03-14 MED ORDER — ONDANSETRON HCL 4 MG PO TABS
4.0000 mg | ORAL_TABLET | Freq: Once | ORAL | Status: AC
  Administered 2013-03-14: 4 mg via ORAL
  Filled 2013-03-14: qty 1

## 2013-03-14 MED ORDER — KETOROLAC TROMETHAMINE 60 MG/2ML IM SOLN
60.0000 mg | Freq: Once | INTRAMUSCULAR | Status: AC
  Administered 2013-03-14: 60 mg via INTRAMUSCULAR
  Filled 2013-03-14: qty 2
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
## 2013-03-14 NOTE — ED Provider Notes (Signed)
Medical screening examination/treatment/procedure(s) were performed by non-physician practitioner and as supervising physician I was immediately available for consultation/collaboration.   EKG Interpretation None      Rolland Porter, MD, Abram Sander   Janice Norrie, MD 03/14/13 [phone]
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
## 2013-03-16 ENCOUNTER — Other Ambulatory Visit (INDEPENDENT_AMBULATORY_CARE_PROVIDER_SITE_OTHER): Payer: Self-pay

## 2013-03-16 ENCOUNTER — Telehealth: Payer: Self-pay | Admitting: Family Medicine

## 2013-03-16 DIAGNOSIS — E871 Hypo-osmolality and hyponatremia: Secondary | ICD-10-CM

## 2013-03-16 MED ORDER — ALBUTEROL SULFATE (2.5 MG/3ML) 0.083% IN NEBU: 2.5000 mg | INHALATION_SOLUTION | RESPIRATORY_TRACT | Status: DC | PRN

## 2013-03-16 NOTE — Telephone Encounter (Signed)
Refill appropriate and filled per protocol.  [place].

## 2013-03-16 NOTE — Anesthesia Preprocedure Evaluation (Addendum)
Anesthesia Evaluation  Patient identified by MRN, date of birth, ID band Patient awake    Reviewed: Allergy & Precautions, H&P , NPO status , Patient's Chart, lab work & pertinent test results  Airway Mallampati: II TM Distance: >3 FB Neck ROM: Full    Dental no notable dental hx.    Pulmonary COPDCurrent Smoker,  breath sounds clear to auscultation  + decreased breath sounds      Cardiovascular hypertension, Pt. on medications + Peripheral Vascular Disease Rhythm:Regular Rate:Normal     Neuro/Psych Bipolar Disorder TIA   GI/Hepatic negative GI ROS, Neg liver ROS,   Endo/Other  negative endocrine ROS  Renal/GU negative Renal ROS  negative genitourinary   Musculoskeletal  (+) Arthritis -, on steriods ,    Abdominal   Peds negative pediatric ROS (+)  Hematology negative hematology ROS (+)   Anesthesia Other Findings   Reproductive/Obstetrics negative OB ROS                        Anesthesia Physical Anesthesia Plan  ASA: III  Anesthesia Plan: General   Post-op Pain Management:    Induction: Intravenous, Rapid sequence and Cricoid pressure planned  Airway Management Planned: Oral ETT  Additional Equipment:   Intra-op Plan:   Post-operative Plan: Possible Post-op intubation/ventilation  Informed Consent: I have reviewed the patients History and Physical, chart, labs and discussed the procedure including the risks, benefits and alternatives for the proposed anesthesia with the patient or authorized representative who has indicated his/her understanding and acceptance.   Dental advisory given  Plan Discussed with: CRNA and Surgeon  Anesthesia Plan Comments:       Anesthesia Quick Evaluation

## 2013-03-16 NOTE — Telephone Encounter (Signed)
PT is wanting to see if we had received the fax for her refill on her albuterol (PROVENTIL) (2.5 MG/3ML) 0.083% nebulizer solution She states [employer] was suppose to have faxed over a refill request, the pt states she is having surgery tomorrow and she needs this  Call back number is [phone]

## 2013-03-17 ENCOUNTER — Inpatient Hospital Stay (HOSPITAL_COMMUNITY)
Admission: RE | Admit: 2013-03-17 | Discharge: 2013-03-19 | DRG: 327 | Disposition: A | Discharge status: Home / Self Care | Payer: Medicare Other | Source: Ambulatory Visit | Attending: General Surgery | Admitting: General Surgery

## 2013-03-17 ENCOUNTER — Inpatient Hospital Stay (HOSPITAL_COMMUNITY): Payer: Medicare Other | Admitting: Anesthesiology

## 2013-03-17 ENCOUNTER — Encounter (HOSPITAL_COMMUNITY): Payer: Medicare Other | Admitting: Anesthesiology

## 2013-03-17 ENCOUNTER — Encounter (HOSPITAL_COMMUNITY): Payer: Self-pay | Admitting: *Deleted

## 2013-03-17 ENCOUNTER — Encounter (HOSPITAL_COMMUNITY)
Admission: RE | Disposition: A | Discharge status: Home / Self Care | Payer: Self-pay | Source: Ambulatory Visit | Attending: General Surgery

## 2013-03-17 DIAGNOSIS — G589 Mononeuropathy, unspecified: Secondary | ICD-10-CM | POA: Diagnosis present

## 2013-03-17 DIAGNOSIS — M5137 Other intervertebral disc degeneration, lumbosacral region: Secondary | ICD-10-CM | POA: Diagnosis present

## 2013-03-17 DIAGNOSIS — F17200 Nicotine dependence, unspecified, uncomplicated: Secondary | ICD-10-CM | POA: Diagnosis present

## 2013-03-17 DIAGNOSIS — F319 Bipolar disorder, unspecified: Secondary | ICD-10-CM | POA: Diagnosis present

## 2013-03-17 DIAGNOSIS — K220 Achalasia of cardia: Principal | ICD-10-CM | POA: Diagnosis present

## 2013-03-17 DIAGNOSIS — J449 Chronic obstructive pulmonary disease, unspecified: Secondary | ICD-10-CM | POA: Diagnosis present

## 2013-03-17 DIAGNOSIS — E2749 Other adrenocortical insufficiency: Secondary | ICD-10-CM | POA: Diagnosis present

## 2013-03-17 DIAGNOSIS — Z8249 Family history of ischemic heart disease and other diseases of the circulatory system: Secondary | ICD-10-CM

## 2013-03-17 DIAGNOSIS — F411 Generalized anxiety disorder: Secondary | ICD-10-CM | POA: Diagnosis present

## 2013-03-17 DIAGNOSIS — Z79899 Other long term (current) drug therapy: Secondary | ICD-10-CM

## 2013-03-17 DIAGNOSIS — Z981 Arthrodesis status: Secondary | ICD-10-CM

## 2013-03-17 DIAGNOSIS — E785 Hyperlipidemia, unspecified: Secondary | ICD-10-CM | POA: Diagnosis present

## 2013-03-17 DIAGNOSIS — E063 Autoimmune thyroiditis: Secondary | ICD-10-CM | POA: Diagnosis present

## 2013-03-17 DIAGNOSIS — Z9089 Acquired absence of other organs: Secondary | ICD-10-CM

## 2013-03-17 DIAGNOSIS — Z85820 Personal history of malignant melanoma of skin: Secondary | ICD-10-CM

## 2013-03-17 DIAGNOSIS — Z9849 Cataract extraction status, unspecified eye: Secondary | ICD-10-CM

## 2013-03-17 DIAGNOSIS — I739 Peripheral vascular disease, unspecified: Secondary | ICD-10-CM | POA: Diagnosis present

## 2013-03-17 DIAGNOSIS — Z01812 Encounter for preprocedural laboratory examination: Secondary | ICD-10-CM

## 2013-03-17 DIAGNOSIS — J4489 Other specified chronic obstructive pulmonary disease: Secondary | ICD-10-CM | POA: Diagnosis present

## 2013-03-17 DIAGNOSIS — I10 Essential (primary) hypertension: Secondary | ICD-10-CM | POA: Diagnosis present

## 2013-03-17 DIAGNOSIS — Z8673 Personal history of transient ischemic attack (TIA), and cerebral infarction without residual deficits: Secondary | ICD-10-CM

## 2013-03-17 DIAGNOSIS — M51379 Other intervertebral disc degeneration, lumbosacral region without mention of lumbar back pain or lower extremity pain: Secondary | ICD-10-CM | POA: Diagnosis present

## 2013-03-17 DIAGNOSIS — G8929 Other chronic pain: Secondary | ICD-10-CM | POA: Diagnosis present

## 2013-03-17 DIAGNOSIS — K219 Gastro-esophageal reflux disease without esophagitis: Secondary | ICD-10-CM | POA: Diagnosis present

## 2013-03-17 HISTORY — PX: HELLER MYOTOMY: SHX5259

## 2013-03-17 LAB — COMPREHENSIVE METABOLIC PANEL
ALBUMIN: 3.4 g/dL — AB (ref 3.5–5.2)
ALT: 11 U/L (ref 0–35)
AST: 14 U/L (ref 0–37)
Alkaline Phosphatase: 50 U/L (ref 39–117)
BUN: 9 mg/dL (ref 6–23)
CHLORIDE: 81 meq/L — AB (ref 96–112)
CO2: 31 mEq/L (ref 19–32)
CREATININE: 0.78 mg/dL (ref 0.50–1.10)
Calcium: 9.5 mg/dL (ref 8.4–10.5)
GFR calc Af Amer: >90 mL/min (ref >90)
Glucose, Bld: 100 mg/dL — ABNORMAL HIGH (ref 70–99)
Potassium: 4.1 mEq/L (ref 3.7–5.3)
Sodium: 124 mEq/L — ABNORMAL LOW (ref 137–147)
Total Protein: 6.6 g/dL (ref 6.0–8.3)

## 2013-03-17 LAB — GLUCOSE, CAPILLARY
GLUCOSE-CAPILLARY: 110 mg/dL — AB (ref 70–99)
Glucose-Capillary: 113 mg/dL — ABNORMAL HIGH (ref 70–99)
Glucose-Capillary: 155 mg/dL — ABNORMAL HIGH (ref 70–99)
Glucose-Capillary: 138 mg/dL — ABNORMAL HIGH (ref 70–99)

## 2013-03-17 SURGERY — ESOPHAGOMYOTOMY, LAPAROSCOPIC, HELLER
Anesthesia: General

## 2013-03-17 MED ORDER — PROPOFOL 10 MG/ML IV BOLUS
INTRAVENOUS | Status: AC
  Filled 2013-03-17: qty 20

## 2013-03-17 MED ORDER — PROPOFOL 10 MG/ML IV BOLUS
INTRAVENOUS | Status: DC | PRN
  Administered 2013-03-17: 160 mg via INTRAVENOUS

## 2013-03-17 MED ORDER — LACTATED RINGERS IR SOLN
Status: DC | PRN
  Administered 2013-03-17: 1000 mL

## 2013-03-17 MED ORDER — LIDOCAINE 5 % EX PTCH
1.0000 | MEDICATED_PATCH | Freq: Every day | CUTANEOUS | Status: DC | PRN
  Filled 2013-03-17: qty 1

## 2013-03-17 MED ORDER — LIDOCAINE HCL (CARDIAC) 20 MG/ML IV SOLN
INTRAVENOUS | Status: DC | PRN
  Administered 2013-03-17: 50 mg via INTRAVENOUS

## 2013-03-17 MED ORDER — CEFAZOLIN SODIUM-DEXTROSE 2-3 GM-% IV SOLR
2.0000 g | INTRAVENOUS | Status: AC
  Administered 2013-03-17: 2 g via INTRAVENOUS

## 2013-03-17 MED ORDER — PROMETHAZINE HCL 25 MG/ML IJ SOLN: 6.2500 mg | INTRAMUSCULAR | Status: DC | PRN

## 2013-03-17 MED ORDER — ROCURONIUM BROMIDE 100 MG/10ML IV SOLN
INTRAVENOUS | Status: AC
  Filled 2013-03-17: qty 1

## 2013-03-17 MED ORDER — ONDANSETRON HCL 4 MG/2ML IJ SOLN
INTRAMUSCULAR | Status: AC
  Filled 2013-03-17: qty 2

## 2013-03-17 MED ORDER — LACTATED RINGERS IV SOLN: INTRAVENOUS | Status: DC

## 2013-03-17 MED ORDER — OXYBUTYNIN CHLORIDE ER 10 MG PO TB24
10.0000 mg | ORAL_TABLET | Freq: Every day | ORAL | Status: DC
  Administered 2013-03-18 – 2013-03-19 (×2): 10 mg via ORAL
  Filled 2013-03-17 (×3): qty 1

## 2013-03-17 MED ORDER — NICOTINE 21 MG/24HR TD PT24
21.0000 mg | MEDICATED_PATCH | Freq: Every day | TRANSDERMAL | Status: DC
  Administered 2013-03-17 – 2013-03-19 (×3): 21 mg via TRANSDERMAL
  Filled 2013-03-17 (×3): qty 1

## 2013-03-17 MED ORDER — CEFAZOLIN SODIUM-DEXTROSE 2-3 GM-% IV SOLR
INTRAVENOUS | Status: AC
  Filled 2013-03-17: qty 50

## 2013-03-17 MED ORDER — NEOSTIGMINE METHYLSULFATE 1 MG/ML IJ SOLN
INTRAMUSCULAR | Status: AC
  Filled 2013-03-17: qty 10

## 2013-03-17 MED ORDER — ONDANSETRON HCL 4 MG/2ML IJ SOLN: 4.0000 mg | INTRAMUSCULAR | Status: DC | PRN

## 2013-03-17 MED ORDER — LOSARTAN POTASSIUM 50 MG PO TABS
50.0000 mg | ORAL_TABLET | Freq: Every morning | ORAL | Status: DC
  Administered 2013-03-18 – 2013-03-19 (×2): 50 mg via ORAL
  Filled 2013-03-17 (×3): qty 1

## 2013-03-17 MED ORDER — ALBUTEROL SULFATE HFA 108 (90 BASE) MCG/ACT IN AERS: 2.0000 | INHALATION_SPRAY | Freq: Four times a day (QID) | RESPIRATORY_TRACT | Status: DC | PRN

## 2013-03-17 MED ORDER — ESCITALOPRAM OXALATE 20 MG PO TABS
20.0000 mg | ORAL_TABLET | Freq: Every day | ORAL | Status: DC
  Administered 2013-03-19: 20 mg via ORAL
  Filled 2013-03-17 (×2): qty 1

## 2013-03-17 MED ORDER — HYDROCHLOROTHIAZIDE 25 MG PO TABS
25.0000 mg | ORAL_TABLET | Freq: Every morning | ORAL | Status: DC
  Administered 2013-03-18 – 2013-03-19 (×2): 25 mg via ORAL
  Filled 2013-03-17 (×3): qty 1

## 2013-03-17 MED ORDER — INSULIN ASPART 100 UNIT/ML ~~LOC~~ SOLN
0.0000 [IU] | Freq: Three times a day (TID) | SUBCUTANEOUS | Status: DC
  Administered 2013-03-17: 4 [IU] via SUBCUTANEOUS

## 2013-03-17 MED ORDER — BACLOFEN 10 MG PO TABS
10.0000 mg | ORAL_TABLET | Freq: Two times a day (BID) | ORAL | Status: DC
  Administered 2013-03-18 – 2013-03-19 (×3): 10 mg via ORAL
  Filled 2013-03-17 (×6): qty 1

## 2013-03-17 MED ORDER — 0.9 % SODIUM CHLORIDE (POUR BTL) OPTIME
TOPICAL | Status: DC | PRN
  Administered 2013-03-17: 1000 mL

## 2013-03-17 MED ORDER — DULOXETINE HCL 60 MG PO CPEP
60.0000 mg | ORAL_CAPSULE | Freq: Every morning | ORAL | Status: DC
  Administered 2013-03-18 – 2013-03-19 (×2): 60 mg via ORAL
  Filled 2013-03-17 (×2): qty 1

## 2013-03-17 MED ORDER — GLYCOPYRROLATE 0.2 MG/ML IJ SOLN
INTRAMUSCULAR | Status: DC | PRN
  Administered 2013-03-17: 0.6 mg via INTRAVENOUS

## 2013-03-17 MED ORDER — ONDANSETRON HCL 4 MG/2ML IJ SOLN
INTRAMUSCULAR | Status: DC | PRN
  Administered 2013-03-17: 4 mg via INTRAVENOUS

## 2013-03-17 MED ORDER — TIOTROPIUM BROMIDE MONOHYDRATE 18 MCG IN CAPS
18.0000 ug | ORAL_CAPSULE | Freq: Every day | RESPIRATORY_TRACT | Status: DC
  Administered 2013-03-17 – 2013-03-18 (×2): 18 ug via RESPIRATORY_TRACT
  Filled 2013-03-17: qty 5

## 2013-03-17 MED ORDER — HYDROMORPHONE HCL PF 1 MG/ML IJ SOLN
INTRAMUSCULAR | Status: AC
  Filled 2013-03-17: qty 1

## 2013-03-17 MED ORDER — ALBUTEROL SULFATE (2.5 MG/3ML) 0.083% IN NEBU
2.5000 mg | INHALATION_SOLUTION | RESPIRATORY_TRACT | Status: DC | PRN
  Administered 2013-03-18 (×2): 2.5 mg via RESPIRATORY_TRACT
  Filled 2013-03-17 (×2): qty 3

## 2013-03-17 MED ORDER — CLONAZEPAM 1 MG PO TABS
1.0000 mg | ORAL_TABLET | Freq: Four times a day (QID) | ORAL | Status: DC
  Administered 2013-03-18 – 2013-03-19 (×5): 1 mg via ORAL
  Filled 2013-03-17 (×5): qty 1

## 2013-03-17 MED ORDER — GLYCOPYRROLATE 0.2 MG/ML IJ SOLN
INTRAMUSCULAR | Status: AC
  Filled 2013-03-17: qty 3

## 2013-03-17 MED ORDER — FLEET ENEMA 7-19 GM/118ML RE ENEM: 1.0000 | ENEMA | Freq: Once | RECTAL | Status: DC

## 2013-03-17 MED ORDER — LIDOCAINE HCL (CARDIAC) 20 MG/ML IV SOLN
INTRAVENOUS | Status: AC
  Filled 2013-03-17: qty 5

## 2013-03-17 MED ORDER — LIDOCAINE VISCOUS 2 % MT SOLN
15.0000 mL | OROMUCOSAL | Status: DC | PRN
  Filled 2013-03-17: qty 15

## 2013-03-17 MED ORDER — DEXAMETHASONE SODIUM PHOSPHATE 10 MG/ML IJ SOLN
INTRAMUSCULAR | Status: AC
Start: 2013-03-17 — End: 2013-03-17
  Filled 2013-03-17: qty 1

## 2013-03-17 MED ORDER — PROPRANOLOL HCL 10 MG PO TABS
10.0000 mg | ORAL_TABLET | Freq: Three times a day (TID) | ORAL | Status: DC
  Administered 2013-03-18 – 2013-03-19 (×3): 10 mg via ORAL
  Filled 2013-03-17 (×8): qty 1

## 2013-03-17 MED ORDER — PREDNISONE 5 MG PO TABS
7.5000 mg | ORAL_TABLET | Freq: Every day | ORAL | Status: DC
  Administered 2013-03-18 – 2013-03-19 (×2): 7.5 mg via ORAL
  Filled 2013-03-17 (×4): qty 1

## 2013-03-17 MED ORDER — SUCCINYLCHOLINE CHLORIDE 20 MG/ML IJ SOLN
INTRAMUSCULAR | Status: DC | PRN
  Administered 2013-03-17: 100 mg via INTRAVENOUS

## 2013-03-17 MED ORDER — FENTANYL CITRATE 0.05 MG/ML IJ SOLN
INTRAMUSCULAR | Status: DC | PRN
  Administered 2013-03-17: 100 ug via INTRAVENOUS
  Administered 2013-03-17 (×3): 50 ug via INTRAVENOUS

## 2013-03-17 MED ORDER — BUPIVACAINE-EPINEPHRINE PF 0.25-1:200000 % IJ SOLN
INTRAMUSCULAR | Status: AC
  Filled 2013-03-17: qty 30

## 2013-03-17 MED ORDER — ROCURONIUM BROMIDE 100 MG/10ML IV SOLN
INTRAVENOUS | Status: DC | PRN
  Administered 2013-03-17 (×3): 10 mg via INTRAVENOUS
  Administered 2013-03-17: 30 mg via INTRAVENOUS

## 2013-03-17 MED ORDER — AMITRIPTYLINE HCL 100 MG PO TABS
100.0000 mg | ORAL_TABLET | Freq: Every day | ORAL | Status: DC
  Administered 2013-03-18: 100 mg via ORAL
  Filled 2013-03-17 (×3): qty 1

## 2013-03-17 MED ORDER — LACTATED RINGERS IV SOLN
INTRAVENOUS | Status: DC | PRN
  Administered 2013-03-17 (×2): via INTRAVENOUS

## 2013-03-17 MED ORDER — DEXAMETHASONE SODIUM PHOSPHATE 10 MG/ML IJ SOLN
INTRAMUSCULAR | Status: DC | PRN
  Administered 2013-03-17: 10 mg via INTRAVENOUS

## 2013-03-17 MED ORDER — BUPIVACAINE-EPINEPHRINE 0.25% -1:200000 IJ SOLN
INTRAMUSCULAR | Status: DC | PRN
  Administered 2013-03-17: 14 mL

## 2013-03-17 MED ORDER — FENTANYL CITRATE 0.05 MG/ML IJ SOLN
INTRAMUSCULAR | Status: AC
  Filled 2013-03-17: qty 5

## 2013-03-17 MED ORDER — MIDAZOLAM HCL 2 MG/2ML IJ SOLN
INTRAMUSCULAR | Status: AC
  Filled 2013-03-17: qty 2

## 2013-03-17 MED ORDER — MIDAZOLAM HCL 5 MG/5ML IJ SOLN
INTRAMUSCULAR | Status: DC | PRN
  Administered 2013-03-17 (×2): 1 mg via INTRAVENOUS

## 2013-03-17 MED ORDER — KCL IN DEXTROSE-NACL 40-5-0.9 MEQ/L-%-% IV SOLN
INTRAVENOUS | Status: DC
  Administered 2013-03-17 – 2013-03-18 (×3): via INTRAVENOUS
  Filled 2013-03-17 (×7): qty 1000

## 2013-03-17 MED ORDER — HYDROMORPHONE HCL PF 1 MG/ML IJ SOLN
0.2500 mg | INTRAMUSCULAR | Status: DC | PRN
Start: 2013-03-17 — End: 2013-03-17
  Administered 2013-03-17: 0.25 mg via INTRAVENOUS

## 2013-03-17 MED ORDER — NEOSTIGMINE METHYLSULFATE 1 MG/ML IJ SOLN
INTRAMUSCULAR | Status: DC | PRN
  Administered 2013-03-17: 4 mg via INTRAVENOUS

## 2013-03-17 MED ORDER — HYDROMORPHONE HCL PF 2 MG/ML IJ SOLN
2.0000 mg | INTRAMUSCULAR | Status: DC | PRN
  Administered 2013-03-17 – 2013-03-19 (×9): 2 mg via INTRAVENOUS
  Filled 2013-03-17 (×9): qty 1

## 2013-03-17 SURGICAL SUPPLY — 50 items
ADH SKN CLS APL DERMABOND .7 (GAUZE/BANDAGES/DRESSINGS) ×1
APPLIER CLIP ROT 10 11.4 M/L (STAPLE)
APR CLP MED LRG 11.4X10 (STAPLE)
CATH FOLEY LATEX FREE 16FR (CATHETERS) ×2 IMPLANT
CHLORAPREP W/TINT 26ML (MISCELLANEOUS) ×3 IMPLANT
CLIP APPLIE ROT 10 11.4 M/L (STAPLE) IMPLANT
DECANTER SPIKE VIAL GLASS SM (MISCELLANEOUS) ×1 IMPLANT
DERMABOND ADVANCED (GAUZE/BANDAGES/DRESSINGS) ×2
DERMABOND ADVANCED .7 DNX12 (GAUZE/BANDAGES/DRESSINGS) IMPLANT
DEVICE TROCAR PUNCTURE CLOSURE (ENDOMECHANICALS) ×2 IMPLANT
DRAIN PENROSE 18X1/4 LTX STRL (WOUND CARE) ×3 IMPLANT
DRAIN PENROSE LF 8X20.3CM SIL (WOUND CARE) ×2 IMPLANT
DRAPE LAPAROSCOPIC ABDOMINAL (DRAPES) ×3 IMPLANT
DRAPE UTILITY XL STRL (DRAPES) ×3 IMPLANT
ELECT REM PT RETURN 9FT ADLT (ELECTROSURGICAL) ×3
ELECTRODE REM PT RTRN 9FT ADLT (ELECTROSURGICAL) ×1 IMPLANT
ENDOLOOP SUT PDS II  0 18 (SUTURE) ×2
ENDOLOOP SUT PDS II 0 18 (SUTURE) IMPLANT
GLOVE BIO SURGEON STRL SZ7.5 (GLOVE) ×1 IMPLANT
GOWN STRL REUS W/TWL XL LVL3 (GOWN DISPOSABLE) ×12 IMPLANT
KIT BASIN OR (CUSTOM PROCEDURE TRAY) ×3 IMPLANT
LEGGING LITHOTOMY PAIR STRL (DRAPES) ×3 IMPLANT
MARKER SKIN DUAL TIP RULER LAB (MISCELLANEOUS) ×2 IMPLANT
NDL INSUFFLATION 14GA 120MM (NEEDLE) ×1 IMPLANT
NEEDLE INSUFFLATION 14GA 120MM (NEEDLE) ×3 IMPLANT
RELOAD STAPLE 4.0 BLU F/HERNIA (INSTRUMENTS) IMPLANT
RELOAD STAPLE 4.8 BLK F/HERNIA (STAPLE) IMPLANT
RELOAD STAPLE HERNIA 4.0 BLUE (INSTRUMENTS) IMPLANT
RELOAD STAPLE HERNIA 4.8 BLK (STAPLE) IMPLANT
SCALPEL HARMONIC ACE (MISCELLANEOUS) IMPLANT
SET IRRIG TUBING LAPAROSCOPIC (IRRIGATION / IRRIGATOR) ×3 IMPLANT
SLEEVE XCEL OPT CAN 5 100 (ENDOMECHANICALS) ×12 IMPLANT
SOLUTION ANTI FOG 6CC (MISCELLANEOUS) ×3 IMPLANT
STAPLER HERNIA 12 8.5 360D (INSTRUMENTS) ×3 IMPLANT
STAPLER VISISTAT 35W (STAPLE) IMPLANT
SUT ETHIBOND 2 0 SH (SUTURE) ×3
SUT ETHIBOND 2 0 SH 36X2 (SUTURE) ×3 IMPLANT
SUT MNCRL AB 4-0 PS2 18 (SUTURE) ×3 IMPLANT
SUT SILK 2 0 SH (SUTURE) ×10 IMPLANT
TIP INNERVISION DETACH 40FR (MISCELLANEOUS) IMPLANT
TIP INNERVISION DETACH 50FR (MISCELLANEOUS) IMPLANT
TIP INNERVISION DETACH 56FR (MISCELLANEOUS) IMPLANT
TIPS INNERVISION DETACH 40FR (MISCELLANEOUS)
TOWEL OR 17X26 10 PK STRL BLUE (TOWEL DISPOSABLE) ×5 IMPLANT
TOWEL OR NON WOVEN STRL DISP B (DISPOSABLE) ×3 IMPLANT
TRAY FOLEY CATH 14FRSI W/METER (CATHETERS) ×1 IMPLANT
TRAY LAP CHOLE (CUSTOM PROCEDURE TRAY) ×3 IMPLANT
TROCAR BLADELESS OPT 5 100 (ENDOMECHANICALS) ×3 IMPLANT
TROCAR XCEL NON-BLD 11X100MML (ENDOMECHANICALS) IMPLANT
TUBING INSUFFLATION 10FT LAP (TUBING) ×3 IMPLANT

## 2013-03-17 NOTE — Anesthesia Postprocedure Evaluation (Signed)
  Anesthesia Post-op Note  Patient: Tonya Sullivan  Procedure(s) Performed: Procedure(s) (LRB): DIAGNOSTIC LAPAROSCOPY, LAPAROSCOPIC HELLER MYOTOMY, ENDOSCOPY, DOR FUNDOPLICATION (N/A)  Patient Location: PACU  Anesthesia Type: General  Level of Consciousness: awake and alert   Airway and Oxygen Therapy: Patient Spontanous Breathing  Post-op Pain: mild  Post-op Assessment: Post-op Vital signs reviewed, Patient's Cardiovascular Status Stable, Respiratory Function Stable, Patent Airway and No signs of Nausea or vomiting  Last Vitals:  Filed Vitals:   03/17/13 1423  BP: 115/80  Pulse: 67  Temp: 36.6 C  Resp: 18    Post-op Vital Signs: stable   Complications: No apparent anesthesia complications

## 2013-03-17 NOTE — Transfer of Care (Signed)
Immediate Anesthesia Transfer of Care Note  Patient: Tonya Sullivan  Procedure(s) Performed: Procedure(s): DIAGNOSTIC LAPAROSCOPY, LAPAROSCOPIC HELLER MYOTOMY, ENDOSCOPY, DOR FUNDOPLICATION (N/A)  Patient Location: PACU  Anesthesia Type:General  Level of Consciousness: awake, alert  and oriented  Airway & Oxygen Therapy: Patient Spontanous Breathing and Patient connected to face mask oxygen  Post-op Assessment: Report given to PACU RN and Post -op Vital signs reviewed and stable  Post vital signs: Reviewed and stable  Complications: No apparent anesthesia complications

## 2013-03-17 NOTE — Op Note (Signed)
03/17/2013  10:06 AM  PATIENT:  Tonya Sullivan  58 y.o. female  PRE-OPERATIVE DIAGNOSIS:  achalasia   POST-OPERATIVE DIAGNOSIS:  achalasia   PROCEDURE:  Procedure(s): DIAGNOSTIC LAPAROSCOPY, LAPAROSCOPIC HELLER MYOTOMY, ENDOSCOPY, DOR FUNDOPLICATION (N/A)  SURGEON:  Surgeon(s) and Role:    * Ralene Ok, MD - Primary    * Pedro Earls, MD - Assisting  ANESTHESIA:   general  EBL:  Total I/O In: 1000 [I.V.:1000] Out: 1000 [Urine:900; Blood:100]  BLOOD ADMINISTERED:none  DRAINS: none   LOCAL MEDICATIONS USED:  BUPIVICAINE   SPECIMEN:  No Specimen  DISPOSITION OF SPECIMEN:  N/A  COUNTS:  YES  TOURNIQUET:  * No tourniquets in log *  DICTATION: .Dragon Dictation After the patient was consented the patient was taken back to the operating room placed in the lithotomy position with bilateral SCDs in place.  A timeout was called and all facts were verified.  The abdomen was insufflated using Veress needle technique in the left subcostal margin, palmar support. This was inflated to approximately 2mm of mercury. Subsequent to this a 5 mm trocar and camera were then placed intra-abdominally. There was no injury to any intra-abdominal organs. There is a minimal amount of adhesions to the infraumbilical area. A 10 mm port was then placed just superior to the umbilicus. This was done under direct visualization. Subsequent to this a right subcostal 2mm trochars and  at this time I proceeded to elevate the liver there was a small amount of omental adhesions are taken down sharply and with electrocautery to maintain hemostasis. The left lobe of the liver was elevated up and all adhesions were then taken down a liver retractor was then placed in the left the right subcostal margin incision site in the left lobe of the liver was elevated. A subsequent fiber in her trochars and placed in the left lower quadrant under direct visualization. I proceeded to retract the stomach caudad and to the  left lateral side. I proceeded to mobilize the phrenoesophageal ligament anteriorly and proceeded to dissect the right crus of the diaphragm was easily dissected away. Proceeded to dissect the left crus of the diaphragm which was easily visualized. Then proceeded to dissect posteriorly to the esophagus while retracting the esophagus anterior. This was confirmed as the left crus of the diaphragm was visualized. At this time a Penrose placed around this and a PDS suture placed around the Penrose to help with traction.  At this time the previous wrap was then visualized in suction dissection was used to free it up from the surrounding esophagus and stomach. Several knots were seen and these were cut. The adhesive tissue between the 2 ends of the Nissen fundoplication were teased away and eventually sharply dissected away. This opened up the previous wrap. There was no injury to the esophagus or the stomach at this time. At this time to be at approximately the GE junction area, and proceeded with a hook cautery to obliterated both the longitudinal and circular muscle layers of the esophagus this was carried up along the esophagus cephalad approximately 4-5 cm. At this time we turned our attention to the stomach portion of the myotomy and extend the myotomy to approximately 2-3 cm onto the stomach.  Once this was done, Dr. Hassell Done proceeded with the endoscopy portion of the procedure. The endoscope was passed from the oral cavity down to the esophagus under direct visualization.  Was a moderate amount of debris within the esophagus as expected. The Z line was  seen to be in the middle of the myotomy.  There was minimal after needed for the endoscope to pass into the stomach.  There were no mucosal defects that could be appreciated on the esophageal mucosa on endoscopy.  The air in the stomach was then suctioned out, and the endoscope was withdrawn.  At this time proceeded with 4 wrap of the esophagus. Since the short  gastrics were harvested previously the cardia portion of the stomach was easily brought in front of the esophagus. 2-0 silk were used to reapproximate the left side of the stomach to the edge of the myotomy. There was 2 stitches placed approximately 2 cm apart. The wrap was completed by placing 2-0 silk onto the right side of the myotomy. A single stitch was placed the apex of the myotomy covering the entire myotomy site.    At this time we checked for hemostasis which was excellent. The insufflation was evacuated all trochars removed as was the liver retractor,u nder direct visualization.the patient tolerated the procedure well was taken to recovery in stable condition.   PLAN OF CARE: Admit to inpatient   PATIENT DISPOSITION:  PACU - hemodynamically stable.   Delay start of Pharmacological VTE agent (>24hrs) due to surgical blood loss or risk of bleeding: yes

## 2013-03-17 NOTE — H&P (Signed)
HPI  Tonya Sullivan is a 58 y.o. female. The patient is a 58 year old female who is referred by Dr. Laural Golden secondary to a diagnosis of achalasia. In discussing with the patient she states she's had some emesis on off over the last one to 2 years. The patient underwent manometry as well as esophagogram which revealed findings consistent with achalasia. The patient states that she has trouble with both solids and liquids. She states that there is no specific timing to her dysphagia and occur spontaneously.  Of note the patient is currently on steroids secondary to Addison's disease. She currently takes 7.5 mg per day. The patient also smokes approximately one pack per day.  HPI  Past Medical History   Diagnosis  Date   .  COPD (chronic obstructive pulmonary disease)    .  Depression    .  Chronic back pain      DDD, disc bulge, radiculopathy, spinal stenosis   .  Hyperlipidemia    .  Anxiety    .  Bipolar disorder, unspecified    .  Carotid artery calcification    .  DDD (degenerative disc disease), lumbosacral    .  Spinal stenosis, lumbar    .  Melanoma      removed 1 week ago at Dr Teryl Lucy office   .  Thyroid disease    .  Autoimmune thyroiditis    .  Hypertension    .  Dysrhythmia      palpitations   .  TIA (transient ischemic attack)      august 2014   .  Pneumonia      hx of   .  GERD (gastroesophageal reflux disease)    .  H/O dizziness    .  Neuropathic pain    .  Adrenal gland dysfunction      Addison's disease   .  Addison's disease     Past Surgical History   Procedure  Laterality  Date   .  Appendectomy     .  Tonsillectomy     .  Cholecystectomy     .  Lump left breast       removed- benign   .  Abdominal hysterectomy       tubal pregnancy   .  Inguinal hernia repair     .  Stomach surgery       ?holes in esophgous   .  Ectopic pregnancy surgery     .  Back surgery       x 5;1984;1989;;1999;2000;2010   .  Colonoscopy   01/25/2012     Procedure: COLONOSCOPY;  Surgeon: Rogene Houston, MD; Location: [facility]; Service: Endoscopy; Laterality: N/A; 130   .  Esophagogastroduodenoscopy (egd) with esophageal dilation  N/A  08/13/2012     Procedure: ESOPHAGOGASTRODUODENOSCOPY (EGD) WITH ESOPHAGEAL DILATION; Surgeon: Rogene Houston, MD; Location: [facility]; Service: Endoscopy; Laterality: N/A; 315   .  Breast surgery       left breast   .  Anterior cervical decomp/discectomy fusion  N/A  10/21/2012     Procedure: Cervical Six-Seven Anterior cervical decompression/diskectomy/fusion; Surgeon: Kristeen Miss, MD; Location: [place] [facility]; Service: Neurosurgery; Laterality: N/A; Cervical Six-Seven Anterior cervical decompression/diskectomy/fusion   .  Cataract extraction w/phaco  Right  11/25/2012     Procedure: CATARACT EXTRACTION PHACO AND INTRAOCULAR LENS PLACEMENT (IOC); Surgeon: Elta Guadeloupe T. Gershon Crane, MD; Location: [facility]; Service: Ophthalmology; Laterality: Right; CDE:10.06   .  Cataract extraction  w/phaco  Left  12/09/2012     Procedure: CATARACT EXTRACTION PHACO AND INTRAOCULAR LENS PLACEMENT (IOC); Surgeon: Elta Guadeloupe T. Gershon Crane, MD; Location: [facility]; Service: Ophthalmology; Laterality: Left; CDE:5.06    Family History   Problem  Relation  Age of Onset   .  Hypertension  Mother    .  Heart disease  Mother    .  Heart attack  Mother    .  Anxiety disorder  Mother    .  Cancer  Father      lung cancer   .  Heart attack  Father    .  Alcohol abuse  Father    .  Hypertension  Brother    .  Alcohol abuse  Brother    .  Bipolar disorder  Brother    .  ADD / ADHD  Brother    .  Drug abuse  Brother    .  OCD  Brother    .  Alcohol abuse  Sister    .  Bipolar disorder  Sister    .  Anxiety disorder  Sister    .  ADD / ADHD  Sister    .  Drug abuse  Sister    .  Physical abuse  Sister    .  Sexual abuse  Sister    .  Alcohol abuse  Brother    .  Bipolar disorder  Brother    .  ADD / ADHD  Brother    .  Drug abuse  Brother    .  Alcohol abuse  Sister     .  Bipolar disorder  Sister    .  Anxiety disorder  Sister    .  ADD / ADHD  Sister    .  Physical abuse  Sister    .  Sexual abuse  Sister    .  Alcohol abuse  Brother    .  Bipolar disorder  Brother    .  ADD / ADHD  Brother    .  Bipolar disorder  Brother    .  ADD / ADHD  Brother    .  Alcohol abuse  Brother    .  Paranoid behavior  Brother    .  Physical abuse  Brother    .  Sexual abuse  Brother    .  Bipolar disorder  Maternal Aunt    .  Bipolar disorder  Paternal Aunt    .  Bipolar disorder  Maternal Uncle    .  Bipolar disorder  Paternal Uncle    .  Bipolar disorder  Maternal Grandfather    .  Alcohol abuse  Maternal Grandfather    .  Bipolar disorder  Maternal Grandmother    .  Alcohol abuse  Maternal Grandmother    .  Anxiety disorder  Maternal Grandmother    .  Dementia  Maternal Grandmother    .  Bipolar disorder  Paternal Grandfather    .  Alcohol abuse  Paternal Grandfather    .  Bipolar disorder  Paternal Grandmother    .  Alcohol abuse  Paternal Grandmother    .  Drug abuse  Paternal Grandmother    .  Bipolar disorder  Maternal Uncle    .  Schizophrenia  Neg Hx    .  Seizures  Neg Hx    Social History  History   Substance Use Topics   .  Smoking status:  Current Every Day Smoker -- 0.50 packs/day for 40 years     Types:  Cigarettes   .  Smokeless tobacco:  Never Used      Comment: 6 or 7 cigs a day as of 05/21/2012   .  Alcohol Use:  No    Allergies   Allergen  Reactions   .  Codeine  Itching   .  Cyclobenzaprine  Hives   .  Darvocet [Propoxyphene N-Acetaminophen]  Rash   .  Abilify [Aripiprazole]  Other (See Comments)     tremors   .  Ace Inhibitors    .  Adhesive [Tape]  Hives   .  Gabapentin  Swelling   .  Iodine  Hives   .  Levofloxacin  Hives and Hypertension   .  Sulfa Antibiotics  Itching    Current Outpatient Prescriptions   Medication  Sig  Dispense  Refill   .  albuterol (PROAIR HFA) 108 (90 BASE) MCG/ACT inhaler  Inhale 2 puffs  into the lungs every 6 (six) hours as needed for wheezing or shortness of breath.  1 Inhaler  3   .  albuterol (PROVENTIL HFA;VENTOLIN HFA) 108 (90 BASE) MCG/ACT inhaler  Inhale 2 puffs into the lungs 3 (three) times daily.  1 Inhaler  11   .  albuterol (PROVENTIL) (2.5 MG/3ML) 0.083% nebulizer solution  Take 3 mLs (2.5 mg total) by nebulization every 4 (four) hours as needed for wheezing.  75 mL  3   .  amitriptyline (ELAVIL) 50 MG tablet  Take 2 tablets (100 mg total) by mouth at bedtime.  60 tablet  2   .  azithromycin (ZITHROMAX) 250 MG tablet  Take 250-500 mg by mouth daily. 5 day course starting on 12/22/2012. Take two tablets (500mg  total) on day 1 (12/22/2012), then take one tablet (250mg  total) daiyl for 4 days then STOP     .  baclofen (LIORESAL) 10 MG tablet  Take 10 mg by mouth 2 (two) times daily.     .  Bromfenac Sodium (PROLENSA) 0.07 % SOLN  Place 1 drop into the left eye daily. For post opt cataract surgery until bottle is empty     .  calcium carbonate (OS-CAL) 1250 MG chewable tablet  Chew 1 tablet by mouth daily as needed for heartburn.     .  clonazePAM (KLONOPIN) 1 MG tablet  Take 1 tablet (1 mg total) by mouth 4 (four) times daily.  120 tablet  2   .  DULoxetine (CYMBALTA) 30 MG capsule  Take 30 mg by mouth daily.     Marland Kitchen  escitalopram (LEXAPRO) 20 MG tablet  Take 1 tablet (20 mg total) by mouth every morning.  30 tablet  2   .  feeding supplement, GLUCERNA SHAKE, (GLUCERNA SHAKE) LIQD  Take 237 mLs by mouth daily.     Marland Kitchen  gabapentin (NEURONTIN) 300 MG capsule  Take 300 mg by mouth 3 (three) times daily.     Marland Kitchen  guaiFENesin (MUCINEX) 600 MG 12 hr tablet  Take 1 mg by mouth 2 (two) times daily. [place] course starting on 12/12/2012     .  hydrochlorothiazide (HYDRODIURIL) 25 MG tablet  Take 1 tablet (25 mg total) by mouth daily. For blood pressure and swelling  30 tablet  3   .  lidocaine (LIDODERM) 5 %  Place 1 patch onto the skin every 12 (twelve) hours. Remove & Discard patch within  12 hours or as directed  by MD  30 patch  2   .  losartan (COZAAR) 50 MG tablet  Take 1 tablet (50 mg total) by mouth daily.  90 tablet  3   .  methocarbamol (ROBAXIN) 500 MG tablet  Take 500 mg by mouth 2 (two) times daily.     Marland Kitchen  nystatin (MYCOSTATIN) 100000 UNIT/ML suspension      .  oxybutynin (DITROPAN XL) 10 MG 24 hr tablet  Take 1 tablet (10 mg total) by mouth daily.  30 tablet  3   .  oxyCODONE-acetaminophen (PERCOCET) 10-325 MG per tablet  Take 1 tablet by mouth every 6 (six) hours as needed for pain.     .  pravastatin (PRAVACHOL) 40 MG tablet  Take 1 tablet (40 mg total) by mouth every evening.  30 tablet  3   .  predniSONE (DELTASONE) 2.5 MG tablet  Take 7.5 mg by mouth daily with breakfast.     .  propranolol (INDERAL) 10 MG tablet  Take 1 tablet (10 mg total) by mouth 3 (three) times daily.  90 tablet  2   .  tiotropium (SPIRIVA HANDIHALER) 18 MCG inhalation capsule  Place 1 capsule (18 mcg total) into inhaler and inhale daily.  30 capsule  6   .  senna (SENNA LAX) 8.6 MG tablet  Take 1 tablet by mouth 2 (two) times daily.     .  [DISCONTINUED] Gabapentin, PHN, (GRALISE STARTER) 300 & 600 MG MISC  Take 300-1,800 mg by mouth as directed. 15 DAY Starter pack doses DAY 1- 300 mg DAY 2- 600 mg DAY 3 to 6 - 900 mg DAY 7 to 10 - 1200 mg  DAY 11 to 14 - 1500 mg DAY 15 - 1800 mg      No current facility-administered medications for this visit.   Review of Systems  Review of Systems  Constitutional: Negative.  HENT: Negative.  Respiratory: Negative.  Cardiovascular: Negative.  Gastrointestinal: Positive for vomiting.  Neurological: Negative.  All other systems reviewed and are negative.  Blood pressure 140/84, pulse 80, temperature 97.5 F (36.4 C), temperature source Temporal, resp. rate 16, height 5\' 7"  (1.702 m), weight 193 lb 6.4 oz (87.726 kg).  Physical Exam  Physical Exam  Vitals reviewed.  Constitutional: She is oriented to person, place, and time. She appears well-developed  and well-nourished.  HENT:  Head: Normocephalic and atraumatic.  Eyes: Conjunctivae and EOM are normal. Pupils are equal, round, and reactive to light.  Neck: Normal range of motion. Neck supple.  Cardiovascular: Normal rate, regular rhythm and normal heart sounds.  Pulmonary/Chest: Effort normal and breath sounds normal.  Abdominal: Soft. Bowel sounds are normal. She exhibits no distension and no mass. There is no tenderness. There is no rebound and no guarding.  Musculoskeletal: Normal range of motion.  Neurological: She is alert and oriented to person, place, and time.  Skin: Skin is warm and dry.  Psychiatric: She has a normal mood and affect.  Data Reviewed  Manometry studies which reveal findings of achalasia  Upper GI swallow study which reveals birds beak consistent with achalasia. There is no sigmoid change the esophagus.  Assessment  58 year old female with findings of achalasia. Patient also has a long past medical history that is complicated by steroid use for Addison's disease as well as smoking. I discussed the procedure with the patient in detail and the need to alter smoking habits to assist with healing.  Plan  1.I have discussed with the patient  the need to decrease her smoking habits at this time. Will have patient follow back up in 2 weeks to assess how much she's cutdown at that time.  2. The patient will need vitamin A to help combat her steroid use.  3. I hope to discuss with the patient on the next visit to schedule the patient for a lap Heller myotomy with a Dor wrap.

## 2013-03-18 ENCOUNTER — Encounter (HOSPITAL_COMMUNITY): Payer: Self-pay | Admitting: General Surgery

## 2013-03-18 ENCOUNTER — Inpatient Hospital Stay (HOSPITAL_COMMUNITY): Payer: Medicare Other

## 2013-03-18 DIAGNOSIS — K219 Gastro-esophageal reflux disease without esophagitis: Secondary | ICD-10-CM | POA: Diagnosis present

## 2013-03-18 LAB — GLUCOSE, CAPILLARY
Glucose-Capillary: 96 mg/dL (ref 70–99)
Glucose-Capillary: 97 mg/dL (ref 70–99)
Glucose-Capillary: 100 mg/dL — ABNORMAL HIGH (ref 70–99)
Glucose-Capillary: 105 mg/dL — ABNORMAL HIGH (ref 70–99)
Glucose-Capillary: 93 mg/dL (ref 70–99)

## 2013-03-18 MED ORDER — IOHEXOL 300 MG/ML  SOLN: 50.0000 mL | Freq: Once | INTRAMUSCULAR | Status: DC | PRN

## 2013-03-18 MED ORDER — ENOXAPARIN SODIUM 40 MG/0.4ML ~~LOC~~ SOLN
40.0000 mg | SUBCUTANEOUS | Status: DC
  Administered 2013-03-18 – 2013-03-19 (×2): 40 mg via SUBCUTANEOUS
  Filled 2013-03-18 (×3): qty 0.4

## 2013-03-18 MED ORDER — IOHEXOL 300 MG/ML  SOLN
50.0000 mL | Freq: Once | INTRAMUSCULAR | Status: AC | PRN
  Administered 2013-03-18: 50 mL via ORAL

## 2013-03-18 MED ORDER — PANTOPRAZOLE SODIUM 40 MG IV SOLR
40.0000 mg | Freq: Every day | INTRAVENOUS | Status: DC
  Administered 2013-03-19: 40 mg via INTRAVENOUS
  Filled 2013-03-18: qty 40

## 2013-03-18 MED ORDER — PANTOPRAZOLE SODIUM 40 MG IV SOLR
40.0000 mg | INTRAVENOUS | Status: AC
  Administered 2013-03-18: 40 mg via INTRAVENOUS
  Filled 2013-03-18: qty 40

## 2013-03-19 ENCOUNTER — Other Ambulatory Visit (HOSPITAL_COMMUNITY): Payer: Self-pay | Admitting: Psychiatry

## 2013-03-19 ENCOUNTER — Telehealth (HOSPITAL_COMMUNITY): Payer: Self-pay | Admitting: *Deleted

## 2013-03-19 LAB — GLUCOSE, CAPILLARY
Glucose-Capillary: 82 mg/dL (ref 70–99)
Glucose-Capillary: 92 mg/dL (ref 70–99)

## 2013-03-19 MED ORDER — ONDANSETRON 4 MG PO TBDP: 4.0000 mg | ORAL_TABLET | ORAL | Status: DC | PRN

## 2013-03-19 MED ORDER — PROPRANOLOL HCL 10 MG PO TABS: 10.0000 mg | ORAL_TABLET | Freq: Three times a day (TID) | ORAL | Status: DC

## 2013-03-19 MED ORDER — HYDROCODONE-ACETAMINOPHEN 7.5-325 MG/15ML PO SOLN: 15.0000 mL | Freq: Four times a day (QID) | ORAL | Status: DC | PRN

## 2013-03-19 NOTE — Progress Notes (Signed)
Nutrition Education Note  Patient identified via consult for diet education.   Wt Readings from Last 5 Encounters:  03/17/13 196 lb 1.6 oz (88.95 kg)  03/17/13 196 lb 1.6 oz (88.95 kg)  03/14/13 196 lb (88.905 kg)  03/12/13 202 lb (91.627 kg)  03/13/13 202 lb (91.627 kg)    Body mass index is 30.71 kg/(m^2). Patient meets criteria for class I obesity based on current BMI.   Received consult for diet education for s/p Heller myotomy of esophagus. Per MD, pt to be on full liquid diet for the next 2 weeks, then start a soft diet. Discussed both diets in detail with pt and provided diet handouts with RD contact information. Reviewed recommended foods from all food groups and sample meal plans provided. Teach back method used. Nutritional supplements also discussed.  Expect good compliance.   Mikey College MS, [facility], [place] Pager [phone] After Hours Pager

## 2013-03-19 NOTE — Telephone Encounter (Signed)
done

## 2013-03-19 NOTE — Discharge Instructions (Signed)
Laparoscopic Heller's Myotomy and Anti-Reflux Procedure A laparoscopic Heller's myotomy and anti-reflux procedure is used for treating achalasia. Achalasia is an uncommon condition of the esophagus. The esophagus is the food tube that carries food from your mouth to your stomach. In this condition, the lower esophageal sphincter (the small muscular layer around the bottom of the esophagus) does not relax. The sphincter is the muscle that keeps the food in the stomach. Also in this condition, the esophagus does not contract in the normal way. This means it does not move the food along normally from the mouth to the stomach.  PROCEDURE  Laparoscopic means that the procedure is done using a laparoscope. A laparoscope is a thin, lighted, pencil-sized tube. It is like a telescope. Once you are anesthetized, your surgeon fills your belly (abdomen) with a harmless gas (carbon dioxide). This makes your organs easier to see. The laparoscope is inserted into your abdomen through a small incision (port). This allows your surgeon to see into the abdomen. Many surgeons attach a video camera to the laparoscope to enlarge the view. Other small instruments are also inserted into the abdomen through other small openings (ports). The ports allow the surgeon to perform the operation.  The surgeon locates the area of the esophagus to operate. He/she cuts a length of the esophageal muscle. Cutting this length of muscle relaxes the tightened muscle that does not allow food to go down. The surgeon makes sure not to injure the inner lining of the esophagus. Sometimes a fundoplication is done in addition to the laparoscopy. This is a procedure where the top part of the stomach is wrapped gently around the lower part of the esophagus. It is stitched (sutured) in place to create a valve that prevents food from going back up into the esophagus after eating. This helps make the connecting channel between the esophagus and the stomach work  more normally.  After the procedure, the gas is released from your abdomen. Your incisions are closed with sutures. These incisions are small (usually less than one-half inch). So there is often minimal discomfort following the procedure. The recovery time is shortened as long as there are no complications. You will rest in a recovery roomuntil you are stable and doing well. If there are no complications, you will usually be allowed to go home. RISKS AND COMPLICATIONS Some problems that may occur following this procedure include:  Infection: A germ starts growing in the wound. This can usually be treated with medicine that kills bacteria(antibiotics).  Bleeding following surgery is a possible complication of almost all surgeries. It is usually minimal following this surgery.  Sometimes during this procedure, a hole is accidentally made in the esophagus. This is a complication that usually must be repaired immediately. Document Released: 09/12/2000 Document Revised: 03/12/2011 Document Reviewed: 01/17/2007 [facility] Patient Information 2014 [place], [place].

## 2013-03-19 NOTE — Progress Notes (Signed)
2 Days Post-Op  Subjective: Pt doing well.  Some confusion this AM, but better now.  AMbulated well and tol CLears yesterday well.  Objective: Vital signs in last 24 hours: Temp:  [97.9 F (36.6 C)-98.4 F (36.9 C)] 98.1 F (36.7 C) (03/19 0539) Pulse Rate:  [58-72] 67 (03/19 0539) Resp:  [18] 18 (03/19 0539) BP: (102-133)/(70-85) 117/77 mmHg (03/19 0539) SpO2:  [96 %-99 %] 98 % (03/19 0539) Last BM Date: 03/17/13  Intake/Output from previous day: 03/18 0701 - 03/19 0700 In: 1219 [P.O.:600; I.V.:975] Out: 650 [Urine:650] Intake/Output this shift: Total I/O In: 1575 [P.O.:600; I.V.:975] Out: 650 [Urine:650]  General appearance: alert and cooperative Cardio: regular rate and rhythm, S1, S2 normal, no murmur, click, rub or gallop GI: soft, non-tender; bowel sounds normal; no masses,  no organomegaly Incision/Wound: c/d/i  Lab Results:  No results found for this basename: WBC, HGB, HCT, PLT,  in the last 72 hours BMET  Recent Labs  03/17/13 0549  NA 124*  K 4.1  CL 81*  CO2 31  GLUCOSE 100*  BUN 9  CREATININE 0.78  CALCIUM 9.5   PT/INR No results found for this basename: LABPROT, INR,  in the last 72 hours ABG No results found for this basename: PHART, PCO2, PO2, HCO3,  in the last 72 hours  Studies/Results: Dg Esophagus W/water Sol Cm  03/18/2013   CLINICAL DATA:  Postoperative for Heller myotomy.  EXAM: Water-soluble esophagram  TECHNIQUE: Single contrast examination was performed using water-soluble contrast medium.  FLUOROSCOPY TIME:  0 min, 55 seconds  COMPARISON:  DG CERVICAL SPINE 1V dated 11/13/2012; DG ESOPHAGUS dated 11/05/2012  FINDINGS: No leak in the distal esophagus or proximal stomach identified. Tertiary esophageal contractions were noted. No complicating feature related to Heller myotomy. I obtained frontal and oblique views to fully depict the distal esophageal barium column. No definite stricture.  IMPRESSION: 1. No leak after Heller myotomy.    Electronically Signed   By: Sherryl Barters M.D.   On: 03/18/2013 13:08    Anti-infectives: Anti-infectives   Start     Dose/Rate Route Frequency Ordered Stop   03/17/13 0519  ceFAZolin (ANCEF) IVPB 2 g/50 mL premix     2 g 100 mL/hr over 30 Minutes Intravenous On call to O.R. 03/17/13 [id number] 03/17/13 0738      Assessment/Plan: s/p Procedure(s): DIAGNOSTIC LAPAROSCOPY, LAPAROSCOPIC HELLER MYOTOMY, ENDOSCOPY, DOR FUNDOPLICATION (N/A) Clears- will have Dietician go over full liquid diet meals which she will be on for 2 weeks and soft meals which she will be on thereafter for 2 weeks. Home later today F/u x 2weeks  LOS: 2 days    Rosario Jacks., [facility] 03/19/2013

## 2013-03-19 NOTE — Progress Notes (Signed)
Patient called to desk and stated she needed help quick.  Upon entering room patient bed stripped iv had been pulled out and patient was sitting in recliner. Patient states I made a mess.  Upon further questioning patient stated she woke up confused and had to go to restroom but got up unassisted.  She stated she did not fall,  But there was urine and blood on floor (from iv) which she stated she cleaned herself.   Patient bed changed floor cleaned and new gown placed on patient.  No injuries were present on patient and she was oriented x 4 at this time.  IV team paged to restart iv as patient is a difficult iv stick.  Patient placed in bed 3 side rails up with call bell, sink light left on for patient comfort

## 2013-03-19 NOTE — Discharge Summary (Signed)
Physician Discharge Summary  Patient ID: Tonya Sullivan MRN: [id number] DOB/AGE: 1955-06-23 58 y.o.  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  She had good pain control and was ambulating on POD # 0.  She ha da swallow eval with rads and was seen to have no leak of her esophagus.  She was started on a CLD and adv to a full liq diet prior to her DC.  She was afebrile and deemed stabel for DC and Dc'd home.  Consults: dietician  Significant Diagnostic Studies: radiology: esophagogram  Treatments: surgery: as above  Discharge Exam: Blood pressure 117/77, pulse 67, temperature 98.1 F (36.7 C), temperature source Oral, resp. rate 18, height 5\' 7"  (1.702 m), weight 196 lb 1.6 oz (88.95 kg), SpO2 98.00%.  Gen:NAD RRR CTAB S/nt/nd active BS, wounds c/d/i  Disposition: 01-Home or Self Care   Future Appointments Provider Department Dept Phone   04/27/2013 12:30 PM Alycia Rossetti, MD [place] [phone]       Medication List         albuterol 108 (90 BASE) MCG/ACT inhaler  Commonly known as:  PROAIR HFA  Inhale 2 puffs into the lungs every 6 (six) hours as needed for wheezing or shortness of breath.     albuterol (2.5 MG/3ML) 0.083% nebulizer solution  Commonly known as:  PROVENTIL  Take 3 mLs (2.5 mg total) by nebulization every 4 (four) hours as needed for wheezing.     amitriptyline 100 MG tablet  Commonly known as:  ELAVIL  Take 1 tablet (100 mg total) by mouth at bedtime.     baclofen 10 MG tablet  Commonly known as:  LIORESAL  Take 10 mg by mouth 2 (two) times daily.     clonazePAM 1 MG tablet  Commonly known as:  KLONOPIN  Take 1 tablet (1 mg total) by mouth 4 (four) times daily.     dexamethasone 0.5 MG/5ML elixir  2 (two) times daily.     DULoxetine 60 MG capsule  Commonly known as:  CYMBALTA  Take 1 capsule (60 mg total) by mouth every morning.     escitalopram 20 MG tablet  Commonly known as:  LEXAPRO  Take 1 tablet (20 mg total) by mouth every morning.     fluocinonide gel 0.05 %  Commonly known as:  LIDEX  Apply 1 application topically 3 (three) times daily. To mouth ulcers     hydrochlorothiazide 25 MG tablet  Commonly known as:  HYDRODIURIL  Take 1 tablet (25 mg total) by mouth every morning.     HYDROcodone-acetaminophen 7.5-325 mg/15 ml solution  Commonly known as:  HYCET  Take 15 mLs by mouth 4 (four) times daily as needed for moderate pain.     lidocaine 2 % solution  Commonly known as:  XYLOCAINE  Use as directed 15 mLs in the mouth or throat every 3 (three) hours as needed for mouth pain.     lidocaine 5 %  Commonly known as:  LIDODERM  - Place 1 patch onto the skin daily as needed (pain). Remove & Discard patch within 12 hours or as directed by MD  - WEARS PATCH ON LOWER BACK FOR BACK PAIN     losartan 50 MG tablet  Commonly  known as:  COZAAR  Take 1 tablet (50 mg total) by mouth every morning.     nicotine 21 mg/24hr patch  Commonly known as:  NICODERM CQ - dosed in mg/24 hours  Place 21 mg onto the skin daily.     ondansetron 4 MG disintegrating tablet  Commonly known as:  ZOFRAN ODT  Take 1 tablet (4 mg total) by mouth every 4 (four) hours as needed for nausea or vomiting.     oxybutynin 10 MG 24 hr tablet  Commonly known as:  DITROPAN XL  Take 1 tablet (10 mg total) by mouth daily.     oxyCODONE-acetaminophen 10-325 MG per tablet  Commonly known as:  PERCOCET  Take 1 tablet by mouth every 8 (eight) hours as needed for pain.     pravastatin 40 MG tablet  Commonly known as:  PRAVACHOL  Take 1 tablet (40 mg total) by mouth every evening.     predniSONE 2.5 MG tablet  Commonly known as:  DELTASONE  Take 3 tablets (7.5 mg  total) by mouth daily with breakfast.     propranolol 10 MG tablet  Commonly known as:  INDERAL  Take 10 mg by mouth 3 (three) times daily.     tetrahydrozoline 0.05 % ophthalmic solution  Place 1 drop into both eyes 2 (two) times daily as needed (dry eyes).     tiotropium 18 MCG inhalation capsule  Commonly known as:  SPIRIVA  Place 1 capsule (18 mcg total) into inhaler and inhale daily. USES IN THE EVENING         Signed: Rosario Jacks., [facility] 03/19/2013, 7:24 AM

## 2013-03-26 ENCOUNTER — Telehealth: Payer: Self-pay | Admitting: *Deleted

## 2013-03-26 NOTE — Telephone Encounter (Signed)
Patient reports that MD called in prescription for mouth inflammation previously.   States that she has Lidocaine from surgery on esophagus, but it is not effective.   Also reports that she has tried [employer] and it is not effective.   MD please advise.

## 2013-03-26 NOTE — Telephone Encounter (Signed)
Message copied by Sheral Flow on Thu Mar 26, 2013  3:07 PM ------      Message from: Lenore Manner      Created: Thu Mar 26, 2013  2:13 PM      Regarding: Meds not working      Contact: [phone]       Pt is calling because she had surgery last week and the medication that they gave her is not working. Her mouth, gums, and throat is burning.  ------

## 2013-03-27 ENCOUNTER — Encounter (HOSPITAL_COMMUNITY): Payer: Self-pay | Admitting: Emergency Medicine

## 2013-03-27 ENCOUNTER — Emergency Department (HOSPITAL_COMMUNITY): Payer: Medicare Other

## 2013-03-27 ENCOUNTER — Inpatient Hospital Stay (HOSPITAL_COMMUNITY)
Admission: EM | Admit: 2013-03-27 | Discharge: 2013-03-29 | DRG: 641 | Disposition: A | Discharge status: Home / Self Care | Payer: Medicare Other | Attending: Internal Medicine | Admitting: Internal Medicine

## 2013-03-27 DIAGNOSIS — J4489 Other specified chronic obstructive pulmonary disease: Secondary | ICD-10-CM | POA: Diagnosis present

## 2013-03-27 DIAGNOSIS — M5137 Other intervertebral disc degeneration, lumbosacral region: Secondary | ICD-10-CM | POA: Diagnosis present

## 2013-03-27 DIAGNOSIS — Z79899 Other long term (current) drug therapy: Secondary | ICD-10-CM

## 2013-03-27 DIAGNOSIS — Z8673 Personal history of transient ischemic attack (TIA), and cerebral infarction without residual deficits: Secondary | ICD-10-CM

## 2013-03-27 DIAGNOSIS — B370 Candidal stomatitis: Secondary | ICD-10-CM

## 2013-03-27 DIAGNOSIS — I10 Essential (primary) hypertension: Secondary | ICD-10-CM

## 2013-03-27 DIAGNOSIS — F17200 Nicotine dependence, unspecified, uncomplicated: Secondary | ICD-10-CM

## 2013-03-27 DIAGNOSIS — E2740 Unspecified adrenocortical insufficiency: Secondary | ICD-10-CM

## 2013-03-27 DIAGNOSIS — Z720 Tobacco use: Secondary | ICD-10-CM

## 2013-03-27 DIAGNOSIS — E873 Alkalosis: Secondary | ICD-10-CM

## 2013-03-27 DIAGNOSIS — Z8249 Family history of ischemic heart disease and other diseases of the circulatory system: Secondary | ICD-10-CM

## 2013-03-27 DIAGNOSIS — E785 Hyperlipidemia, unspecified: Secondary | ICD-10-CM | POA: Diagnosis present

## 2013-03-27 DIAGNOSIS — R1310 Dysphagia, unspecified: Secondary | ICD-10-CM

## 2013-03-27 DIAGNOSIS — E871 Hypo-osmolality and hyponatremia: Principal | ICD-10-CM | POA: Diagnosis present

## 2013-03-27 DIAGNOSIS — R911 Solitary pulmonary nodule: Secondary | ICD-10-CM | POA: Diagnosis present

## 2013-03-27 DIAGNOSIS — K219 Gastro-esophageal reflux disease without esophagitis: Secondary | ICD-10-CM

## 2013-03-27 DIAGNOSIS — E2749 Other adrenocortical insufficiency: Secondary | ICD-10-CM | POA: Diagnosis present

## 2013-03-27 DIAGNOSIS — B3781 Candidal esophagitis: Secondary | ICD-10-CM

## 2013-03-27 DIAGNOSIS — E876 Hypokalemia: Secondary | ICD-10-CM

## 2013-03-27 DIAGNOSIS — F3289 Other specified depressive episodes: Secondary | ICD-10-CM | POA: Diagnosis present

## 2013-03-27 DIAGNOSIS — E878 Other disorders of electrolyte and fluid balance, not elsewhere classified: Secondary | ICD-10-CM

## 2013-03-27 DIAGNOSIS — F411 Generalized anxiety disorder: Secondary | ICD-10-CM | POA: Diagnosis present

## 2013-03-27 DIAGNOSIS — M51379 Other intervertebral disc degeneration, lumbosacral region without mention of lumbar back pain or lower extremity pain: Secondary | ICD-10-CM | POA: Diagnosis present

## 2013-03-27 DIAGNOSIS — F319 Bipolar disorder, unspecified: Secondary | ICD-10-CM | POA: Diagnosis present

## 2013-03-27 DIAGNOSIS — Z801 Family history of malignant neoplasm of trachea, bronchus and lung: Secondary | ICD-10-CM

## 2013-03-27 DIAGNOSIS — IMO0002 Reserved for concepts with insufficient information to code with codable children: Secondary | ICD-10-CM

## 2013-03-27 DIAGNOSIS — E063 Autoimmune thyroiditis: Secondary | ICD-10-CM | POA: Diagnosis present

## 2013-03-27 DIAGNOSIS — G8929 Other chronic pain: Secondary | ICD-10-CM | POA: Diagnosis present

## 2013-03-27 DIAGNOSIS — F329 Major depressive disorder, single episode, unspecified: Secondary | ICD-10-CM | POA: Diagnosis present

## 2013-03-27 DIAGNOSIS — J449 Chronic obstructive pulmonary disease, unspecified: Secondary | ICD-10-CM

## 2013-03-27 DIAGNOSIS — Z85820 Personal history of malignant melanoma of skin: Secondary | ICD-10-CM

## 2013-03-27 LAB — BASIC METABOLIC PANEL
BUN: 7 mg/dL (ref 6–23)
CALCIUM: 9.1 mg/dL (ref 8.4–10.5)
CO2: 33 mEq/L — ABNORMAL HIGH (ref 19–32)
CREATININE: 0.74 mg/dL (ref 0.50–1.10)
Chloride: 72 mEq/L — ABNORMAL LOW (ref 96–112)
GFR calc Af Amer: >90 mL/min (ref >90)
GLUCOSE: 116 mg/dL — AB (ref 70–99)
Potassium: 3.2 mEq/L — ABNORMAL LOW (ref 3.7–5.3)
Sodium: 117 mEq/L — CL (ref 137–147)

## 2013-03-27 LAB — KOH PREP

## 2013-03-27 LAB — CBC
HEMATOCRIT: 37.3 % (ref 36.0–46.0)
HEMOGLOBIN: 13.5 g/dL (ref 12.0–15.0)
MCH: 31.8 pg (ref 26.0–34.0)
MCHC: 36.2 g/dL — AB (ref 30.0–36.0)
MCV: 88 fL (ref 78.0–100.0)
Platelets: 306 10*3/uL (ref 150–400)
RBC: 4.24 MIL/uL (ref 3.87–5.11)
RDW: 13.1 % (ref 11.5–15.5)
WBC: 8.2 10*3/uL (ref 4.0–10.5)

## 2013-03-27 LAB — URINALYSIS, ROUTINE W REFLEX MICROSCOPIC
BILIRUBIN URINE: NEGATIVE
Glucose, UA: NEGATIVE mg/dL
Hgb urine dipstick: NEGATIVE
KETONES UR: NEGATIVE mg/dL
Leukocytes, UA: NEGATIVE
Nitrite: NEGATIVE
PROTEIN: NEGATIVE mg/dL
Specific Gravity, Urine: <1.005 — ABNORMAL LOW (ref 1.005–1.030)
UROBILINOGEN UA: 0.2 mg/dL (ref 0.0–1.0)
pH: 6.5 (ref 5.0–8.0)

## 2013-03-27 LAB — SODIUM, URINE, RANDOM: Sodium, Ur: 25 mEq/L

## 2013-03-27 MED ORDER — SODIUM CHLORIDE 0.9 % IV BOLUS (SEPSIS)
1000.0000 mL | Freq: Once | INTRAVENOUS | Status: AC
  Administered 2013-03-27: 1000 mL via INTRAVENOUS

## 2013-03-27 MED ORDER — AMITRIPTYLINE HCL 25 MG PO TABS
100.0000 mg | ORAL_TABLET | Freq: Every day | ORAL | Status: DC
  Administered 2013-03-28: 100 mg via ORAL
  Filled 2013-03-27: qty 4

## 2013-03-27 MED ORDER — ALBUTEROL SULFATE HFA 108 (90 BASE) MCG/ACT IN AERS: 2.0000 | INHALATION_SPRAY | Freq: Four times a day (QID) | RESPIRATORY_TRACT | Status: DC | PRN

## 2013-03-27 MED ORDER — ONDANSETRON 4 MG PO TBDP: 4.0000 mg | ORAL_TABLET | ORAL | Status: DC | PRN

## 2013-03-27 MED ORDER — IOHEXOL 300 MG/ML  SOLN
100.0000 mL | Freq: Once | INTRAMUSCULAR | Status: AC | PRN
  Administered 2013-03-27: 100 mL via INTRAVENOUS

## 2013-03-27 MED ORDER — LIDOCAINE 5 % EX PTCH
1.0000 | MEDICATED_PATCH | Freq: Every day | CUTANEOUS | Status: DC | PRN
  Administered 2013-03-28: 1 via TRANSDERMAL
  Filled 2013-03-27: qty 1

## 2013-03-27 MED ORDER — NICOTINE 21 MG/24HR TD PT24
21.0000 mg | MEDICATED_PATCH | Freq: Every day | TRANSDERMAL | Status: DC
  Administered 2013-03-28 – 2013-03-29 (×2): 21 mg via TRANSDERMAL
  Filled 2013-03-27 (×2): qty 1

## 2013-03-27 MED ORDER — BACLOFEN 10 MG PO TABS
10.0000 mg | ORAL_TABLET | Freq: Two times a day (BID) | ORAL | Status: DC
  Administered 2013-03-28 – 2013-03-29 (×4): 10 mg via ORAL
  Filled 2013-03-27 (×4): qty 1

## 2013-03-27 MED ORDER — HYDROCORTISONE NA SUCCINATE PF 100 MG IJ SOLR
100.0000 mg | Freq: Once | INTRAMUSCULAR | Status: AC
  Administered 2013-03-27: 100 mg via INTRAVENOUS
  Filled 2013-03-27: qty 2

## 2013-03-27 MED ORDER — PREDNISONE 5 MG PO TABS
7.5000 mg | ORAL_TABLET | Freq: Every day | ORAL | Status: DC
  Administered 2013-03-28 – 2013-03-29 (×2): 7.5 mg via ORAL
  Filled 2013-03-27 (×2): qty 2

## 2013-03-27 MED ORDER — PROPRANOLOL HCL 20 MG PO TABS
10.0000 mg | ORAL_TABLET | Freq: Three times a day (TID) | ORAL | Status: DC
  Administered 2013-03-28 – 2013-03-29 (×5): 10 mg via ORAL
  Filled 2013-03-27 (×5): qty 1

## 2013-03-27 MED ORDER — SODIUM CHLORIDE 0.9 % IV SOLN
INTRAVENOUS | Status: DC
  Administered 2013-03-27: 21:00:00 via INTRAVENOUS

## 2013-03-27 MED ORDER — BISACODYL 10 MG RE SUPP: 10.0000 mg | Freq: Every day | RECTAL | Status: DC | PRN

## 2013-03-27 MED ORDER — LIDOCAINE VISCOUS 2 % MT SOLN: 15.0000 mL | OROMUCOSAL | Status: DC | PRN

## 2013-03-27 MED ORDER — NAPHAZOLINE HCL 0.1 % OP SOLN
1.0000 [drp] | Freq: Four times a day (QID) | OPHTHALMIC | Status: DC | PRN
  Filled 2013-03-27: qty 15

## 2013-03-27 MED ORDER — HEPARIN SODIUM (PORCINE) 5000 UNIT/ML IJ SOLN
5000.0000 [IU] | Freq: Three times a day (TID) | INTRAMUSCULAR | Status: DC
Start: 2013-03-27 — End: 2013-03-29
  Administered 2013-03-28 – 2013-03-29 (×4): 5000 [IU] via SUBCUTANEOUS
  Filled 2013-03-27 (×4): qty 1

## 2013-03-27 MED ORDER — HYDROCORTISONE SOD SUCCINATE 100 MG PF FOR IT USE: 100.0000 mg | Freq: Once | INTRAMUSCULAR | Status: DC

## 2013-03-27 MED ORDER — POTASSIUM CHLORIDE IN NACL 20-0.9 MEQ/L-% IV SOLN
INTRAVENOUS | Status: DC
  Administered 2013-03-28 (×2): via INTRAVENOUS

## 2013-03-27 MED ORDER — FLUOCINONIDE 0.05 % EX GEL
1.0000 "application " | Freq: Three times a day (TID) | CUTANEOUS | Status: DC
  Administered 2013-03-28: 1 via TOPICAL
  Filled 2013-03-27: qty 15

## 2013-03-27 MED ORDER — CLONAZEPAM 0.5 MG PO TABS
1.0000 mg | ORAL_TABLET | Freq: Four times a day (QID) | ORAL | Status: DC
  Administered 2013-03-28 – 2013-03-29 (×6): 1 mg via ORAL
  Filled 2013-03-27 (×6): qty 2

## 2013-03-27 MED ORDER — HYDROCHLOROTHIAZIDE 25 MG PO TABS
25.0000 mg | ORAL_TABLET | Freq: Every morning | ORAL | Status: DC
  Administered 2013-03-28 – 2013-03-29 (×2): 25 mg via ORAL
  Filled 2013-03-27 (×2): qty 1

## 2013-03-27 MED ORDER — SODIUM CHLORIDE 0.9 % IV SOLN
INTRAVENOUS | Status: DC
  Administered 2013-03-28: 01:00:00 via INTRAVENOUS

## 2013-03-27 MED ORDER — FENTANYL CITRATE 0.05 MG/ML IJ SOLN
25.0000 ug | Freq: Once | INTRAMUSCULAR | Status: AC
  Administered 2013-03-27: 25 ug via INTRAVENOUS
  Filled 2013-03-27: qty 2

## 2013-03-27 MED ORDER — ONDANSETRON HCL 4 MG PO TABS: 4.0000 mg | ORAL_TABLET | Freq: Four times a day (QID) | ORAL | Status: DC | PRN

## 2013-03-27 MED ORDER — SIMVASTATIN 20 MG PO TABS
40.0000 mg | ORAL_TABLET | Freq: Every day | ORAL | Status: DC
  Administered 2013-03-28: 40 mg via ORAL
  Filled 2013-03-27: qty 2

## 2013-03-27 MED ORDER — OXYCODONE HCL 5 MG PO TABS
5.0000 mg | ORAL_TABLET | Freq: Three times a day (TID) | ORAL | Status: DC | PRN
  Administered 2013-03-28 – 2013-03-29 (×4): 5 mg via ORAL
  Filled 2013-03-27 (×5): qty 1

## 2013-03-27 MED ORDER — OXYBUTYNIN CHLORIDE ER 5 MG PO TB24
10.0000 mg | ORAL_TABLET | Freq: Every day | ORAL | Status: DC
  Administered 2013-03-28 – 2013-03-29 (×2): 10 mg via ORAL
  Filled 2013-03-27 (×2): qty 2

## 2013-03-27 MED ORDER — DULOXETINE HCL 60 MG PO CPEP
60.0000 mg | ORAL_CAPSULE | Freq: Every morning | ORAL | Status: DC
  Administered 2013-03-28 – 2013-03-29 (×2): 60 mg via ORAL
  Filled 2013-03-27 (×2): qty 1

## 2013-03-27 MED ORDER — ONDANSETRON HCL 4 MG/2ML IJ SOLN: 4.0000 mg | Freq: Four times a day (QID) | INTRAMUSCULAR | Status: DC | PRN

## 2013-03-27 MED ORDER — OXYCODONE-ACETAMINOPHEN 10-325 MG PO TABS
1.0000 | ORAL_TABLET | Freq: Three times a day (TID) | ORAL | Status: DC | PRN
Start: 2013-03-27 — End: 2013-03-27

## 2013-03-27 MED ORDER — ALBUTEROL SULFATE (2.5 MG/3ML) 0.083% IN NEBU
2.5000 mg | INHALATION_SOLUTION | RESPIRATORY_TRACT | Status: DC | PRN
  Administered 2013-03-28: 2.5 mg via RESPIRATORY_TRACT
  Filled 2013-03-27: qty 3

## 2013-03-27 MED ORDER — TIOTROPIUM BROMIDE MONOHYDRATE 18 MCG IN CAPS
18.0000 ug | ORAL_CAPSULE | Freq: Every day | RESPIRATORY_TRACT | Status: DC
  Administered 2013-03-28: 18 ug via RESPIRATORY_TRACT
  Filled 2013-03-27: qty 5

## 2013-03-27 MED ORDER — SODIUM CHLORIDE 0.9 % IJ SOLN: 3.0000 mL | Freq: Two times a day (BID) | INTRAMUSCULAR | Status: DC

## 2013-03-27 MED ORDER — SODIUM CHLORIDE 0.9 % IV SOLN
Freq: Once | INTRAVENOUS | Status: AC
  Administered 2013-03-27: 20:00:00 via INTRAVENOUS

## 2013-03-27 MED ORDER — LOSARTAN POTASSIUM 50 MG PO TABS
50.0000 mg | ORAL_TABLET | Freq: Every morning | ORAL | Status: DC
  Administered 2013-03-28 – 2013-03-29 (×2): 50 mg via ORAL
  Filled 2013-03-27 (×2): qty 1

## 2013-03-27 MED ORDER — MAGNESIUM CITRATE PO SOLN: 1.0000 | Freq: Once | ORAL | Status: AC | PRN

## 2013-03-27 MED ORDER — OXYCODONE-ACETAMINOPHEN 5-325 MG PO TABS
1.0000 | ORAL_TABLET | Freq: Three times a day (TID) | ORAL | Status: DC | PRN
  Administered 2013-03-28 – 2013-03-29 (×4): 1 via ORAL
  Filled 2013-03-27 (×5): qty 1

## 2013-03-27 MED ORDER — ALUM & MAG HYDROXIDE-SIMETH 200-200-20 MG/5ML PO SUSP: 30.0000 mL | Freq: Four times a day (QID) | ORAL | Status: DC | PRN

## 2013-03-27 MED ORDER — ESCITALOPRAM OXALATE 10 MG PO TABS
20.0000 mg | ORAL_TABLET | Freq: Every day | ORAL | Status: DC
  Administered 2013-03-28 – 2013-03-29 (×2): 20 mg via ORAL
  Filled 2013-03-27 (×2): qty 2

## 2013-03-27 MED ORDER — POTASSIUM CHLORIDE 20 MEQ/15ML (10%) PO LIQD
40.0000 meq | Freq: Once | ORAL | Status: AC
  Administered 2013-03-27: 40 meq via ORAL
  Filled 2013-03-27: qty 30

## 2013-03-27 NOTE — ED Notes (Signed)
Swelling of throat,  X 2 weeks, Had surgery for chalasia of esophagus at [facility].3/17  Has had this problem since then

## 2013-03-27 NOTE — Telephone Encounter (Signed)
Have her call her surgeon that did the procedure and explain it is not helping, those are the only medications I know of to  Give her

## 2013-03-27 NOTE — ED Notes (Signed)
Pt assisted to bathroom and back to bed.

## 2013-03-27 NOTE — Telephone Encounter (Signed)
Patient contacted and states that she had called surgeon. Reports that specialist states that she may be coming down with cold or allergies.   Advised to make F/U appointment with surgeon to have area assessed.

## 2013-03-27 NOTE — ED Notes (Signed)
Notified Dr. Laurelyn Sickle that pt's BP 86/64. New orders given and carried out accordingly.
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
## 2013-03-27 NOTE — H&P (Signed)
[facility] History and Physical  Tonya Sullivan QIH:[id number] DOB: 03-Mar-1955 DOA: 03/27/2013  Referring physician: Rolland Porter, MD PCP: Vic Blackbird, MD   Chief Complaint: Difficulty swallowing  HPI: Tonya Sullivan is a 58 y.o. female with a recent procedure for achalasia by Dr Rosendo Gros presents with difficulty swallowing. She states that she felt as though her throat was closing up on her. This has been going on for some time now. She states that she has been on liquid diet since her procedure. She has had pain on swallowing also. She was evaluated in the ED and found to have a sodium of 117. On review her chart shows that she has had low sodiums in the past. She has no cough presently no SOB no CP noted. She does smoke and still is smoking. No nause and no vomiting noted.   Review of Systems:  Constitutional:  No weight loss, night sweats, Fevers, chills, fatigue.  HEENT:  No headaches, Difficulty swallowing,Tooth/dental problems,Sore throat,  No sneezing, itching, ear ache, nasal congestion, post nasal drip,  Cardio-vascular:  No chest pain, Orthopnea, PND, swelling in lower extremities, anasarca, dizziness, palpitations  GI:  No heartburn, indigestion, abdominal pain, nausea, vomiting, diarrhea, change in bowel habits, loss of appetite  Resp:  ++shortness of breath with exertion or at rest. No excess mucus, no productive cough, No non-productive cough, No coughing up of blood. ++wheezing.No chest wall deformity  Skin:  no rash or lesions.  GU:  no dysuria, change in color of urine, no urgency or frequency. No flank pain.  Musculoskeletal:  No joint pain or swelling. No decreased range of motion. No back pain.  Psych:  No change in mood or affect. No depression or anxiety. No memory loss.   Past Medical History  Diagnosis Date  . COPD (chronic obstructive pulmonary disease)   . Depression   . Chronic back pain     DDD, disc bulge, radiculopathy, spinal stenosis  .  Hyperlipidemia   . Anxiety   . Bipolar disorder, unspecified   . Carotid artery calcification   . DDD (degenerative disc disease), lumbosacral   . Spinal stenosis, lumbar   . Thyroid disease   . Autoimmune thyroiditis   . Hypertension   . TIA (transient ischemic attack)     august 2014  . GERD (gastroesophageal reflux disease)   . H/O dizziness   . Neuropathic pain   . Adrenal gland dysfunction     Addison's disease ON DAILY PREDNISONE  . Addison's disease   . Stroke     HX OF MINI STROKE AUG 2014- DROPPIN EVERYTHING, SPEECH SLURRED, MEMORY AFFECTED --ALL RESOLVED - NO DEFICITS NOW  . Pneumonia     LAST PNEUMONIA WAS AUG 2014  . Achalasia   . Mouth ulcers   . Dysrhythmia     palpitations  . Melanoma     removed 1 week ago at Dr Teryl Lucy office   Past Surgical History  Procedure Laterality Date  . Appendectomy    . Cholecystectomy    . Lump left breast      removed- benign  . Abdominal hysterectomy      tubal pregnancy  . Inguinal hernia repair    . Stomach surgery      ?holes in esophgous  . Ectopic pregnancy surgery    . Back surgery      x 5;1984;1989;;1999;2000;2010  . Colonoscopy  01/25/2012    Procedure: COLONOSCOPY;  Surgeon: Rogene Houston, MD;  Location: [facility];  Service: Endoscopy;  Laterality: N/A;  130  . Esophagogastroduodenoscopy (egd) with esophageal dilation N/A 08/13/2012    Procedure: ESOPHAGOGASTRODUODENOSCOPY (EGD) WITH ESOPHAGEAL DILATION;  Surgeon: Rogene Houston, MD;  Location: [facility];  Service: Endoscopy;  Laterality: N/A;  315  . Breast surgery      left breast  . Anterior cervical decomp/discectomy fusion N/A 10/21/2012    Procedure: Cervical Six-Seven Anterior cervical decompression/diskectomy/fusion;  Surgeon: Kristeen Miss, MD;  Location: [facility];  Service: Neurosurgery;  Laterality: N/A;  Cervical Six-Seven Anterior cervical decompression/diskectomy/fusion  . Cataract extraction w/phaco Right 11/25/2012    Procedure:  CATARACT EXTRACTION PHACO AND INTRAOCULAR LENS PLACEMENT (IOC);  Surgeon: Elta Guadeloupe T. Gershon Crane, MD;  Location: [facility];  Service: Ophthalmology;  Laterality: Right;  CDE:10.06  . Cataract extraction w/phaco Left 12/09/2012    Procedure: CATARACT EXTRACTION PHACO AND INTRAOCULAR LENS PLACEMENT (IOC);  Surgeon: Elta Guadeloupe T. Gershon Crane, MD;  Location: [facility];  Service: Ophthalmology;  Laterality: Left;  CDE:5.06  . Tonsillectomy    . Hiatal hernia repair    . Heller myotomy N/A 03/17/2013    Procedure: DIAGNOSTIC LAPAROSCOPY, LAPAROSCOPIC HELLER MYOTOMY, ENDOSCOPY, DOR FUNDOPLICATION;  Surgeon: Ralene Ok, MD;  Location: [facility];  Service: General;  Laterality: N/A;   Social History:  reports that she has been smoking Cigarettes.  She has a 10 pack-year smoking history. She has never used smokeless tobacco. She reports that she does not drink alcohol or use illicit drugs.  Allergies  Allergen Reactions  . Codeine Itching  . Cyclobenzaprine Hives  . Darvocet [Propoxyphene N-Acetaminophen] Rash  . Abilify [Aripiprazole] Other (See Comments)    tremors  . Ace Inhibitors   . Adhesive [Tape] Hives  . Gabapentin Swelling  . Iodine Hives  . Levofloxacin Hives and Hypertension  . Sulfa Antibiotics Itching  . Robaxin [Methocarbamol] Rash    Family History  Problem Relation Age of Onset  . Hypertension Mother   . Heart disease Mother   . Heart attack Mother   . Anxiety disorder Mother   . Cancer Father     lung cancer  . Heart attack Father   . Alcohol abuse Father   . Hypertension Brother   . Alcohol abuse Brother   . Bipolar disorder Brother   . ADD / ADHD Brother   . Drug abuse Brother   . OCD Brother   . Alcohol abuse Sister   . Bipolar disorder Sister   . Anxiety disorder Sister   . ADD / ADHD Sister   . Drug abuse Sister   . Physical abuse Sister   . Sexual abuse Sister   . Alcohol abuse Brother   . Bipolar disorder Brother   . ADD / ADHD Brother   . Drug abuse Brother   . Alcohol  abuse Sister   . Bipolar disorder Sister   . Anxiety disorder Sister   . ADD / ADHD Sister   . Physical abuse Sister   . Sexual abuse Sister   . Alcohol abuse Brother   . Bipolar disorder Brother   . ADD / ADHD Brother   . Bipolar disorder Brother   . ADD / ADHD Brother   . Alcohol abuse Brother   . Paranoid behavior Brother   . Physical abuse Brother   . Sexual abuse Brother   . Bipolar disorder Maternal Aunt   . Bipolar disorder Paternal Aunt   . Bipolar disorder Maternal Uncle   . Bipolar disorder Paternal Uncle   .  Bipolar disorder Maternal Grandfather   . Alcohol abuse Maternal Grandfather   . Bipolar disorder Maternal Grandmother   . Alcohol abuse Maternal Grandmother   . Anxiety disorder Maternal Grandmother   . Dementia Maternal Grandmother   . Bipolar disorder Paternal Grandfather   . Alcohol abuse Paternal Grandfather   . Bipolar disorder Paternal Grandmother   . Alcohol abuse Paternal Grandmother   . Drug abuse Paternal Grandmother   . Bipolar disorder Maternal Uncle   . Schizophrenia Neg Hx   . Seizures Neg Hx      Prior to Admission medications   Medication Sig Start Date End Date Taking? Authorizing Provider  albuterol (PROAIR HFA) 108 (90 BASE) MCG/ACT inhaler Inhale 2 puffs into the lungs every 6 (six) hours as needed for wheezing or shortness of breath. 02/27/13  Yes Alycia Rossetti, MD  albuterol (PROVENTIL) (2.5 MG/3ML) 0.083% nebulizer solution Take 3 mLs (2.5 mg total) by nebulization every 4 (four) hours as needed for wheezing. 03/16/13  Yes Alycia Rossetti, MD  amitriptyline (ELAVIL) 100 MG tablet Take 1 tablet (100 mg total) by mouth at bedtime. 02/12/13  Yes Levonne Spiller, MD  baclofen (LIORESAL) 10 MG tablet Take 10 mg by mouth 2 (two) times daily. 07/25/12  Yes Historical Provider, MD  clonazePAM (KLONOPIN) 1 MG tablet Take 1 tablet (1 mg total) by mouth 4 (four) times daily. 02/12/13 02/12/14 Yes Levonne Spiller, MD  DULoxetine (CYMBALTA) 60 MG capsule  Take 1 capsule (60 mg total) by mouth every morning. 02/12/13  Yes Levonne Spiller, MD  escitalopram (LEXAPRO) 20 MG tablet Take 1 tablet (20 mg total) by mouth every morning. 02/12/13 02/12/14 Yes Levonne Spiller, MD  fluocinonide gel (LIDEX) 2.77 % Apply 1 application topically 3 (three) times daily. To mouth ulcers 03/11/13  Yes Alycia Rossetti, MD  hydrochlorothiazide (HYDRODIURIL) 25 MG tablet Take 1 tablet (25 mg total) by mouth every morning. 02/27/13  Yes Alycia Rossetti, MD  lidocaine (LIDODERM) 5 % Place 1 patch onto the skin daily as needed (pain). Remove & Discard patch within 12 hours or as directed by MD WEARS PATCH ON LOWER BACK FOR BACK PAIN 05/22/12 05/22/13 Yes Darrol Jump, MD  lidocaine (XYLOCAINE) 2 % solution Use as directed 15 mLs in the mouth or throat every 3 (three) hours as needed for mouth pain. 03/11/13  Yes Alycia Rossetti, MD  losartan (COZAAR) 50 MG tablet Take 1 tablet (50 mg total) by mouth every morning. 02/27/13  Yes Alycia Rossetti, MD  ondansetron (ZOFRAN ODT) 4 MG disintegrating tablet Take 1 tablet (4 mg total) by mouth every 4 (four) hours as needed for nausea or vomiting. 03/19/13  Yes Ralene Ok, MD  oxybutynin (DITROPAN XL) 10 MG 24 hr tablet Take 1 tablet (10 mg total) by mouth daily. 02/27/13  Yes Alycia Rossetti, MD  oxyCODONE-acetaminophen (PERCOCET) 10-325 MG per tablet Take 1 tablet by mouth every 8 (eight) hours as needed for pain.    Yes Historical Provider, MD  pravastatin (PRAVACHOL) 40 MG tablet Take 1 tablet (40 mg total) by mouth every evening. 02/27/13  Yes Alycia Rossetti, MD  predniSONE (DELTASONE) 2.5 MG tablet Take 3 tablets (7.5 mg total) by mouth daily with breakfast. 02/27/13  Yes Alycia Rossetti, MD  propranolol (INDERAL) 10 MG tablet Take 1 tablet (10 mg total) by mouth 3 (three) times daily. 03/19/13  Yes Levonne Spiller, MD  tetrahydrozoline 0.05 % ophthalmic solution Place 1 drop into both eyes  2 (two) times daily as needed (dry eyes).   Yes  Historical Provider, MD  tiotropium (SPIRIVA) 18 MCG inhalation capsule Place 1 capsule (18 mcg total) into inhaler and inhale daily. USES IN THE EVENING 02/27/13  Yes Alycia Rossetti, MD  nicotine (NICODERM CQ - DOSED IN MG/24 HOURS) 21 mg/24hr patch Place 21 mg onto the skin daily.    Historical Provider, MD   Physical Exam: Filed Vitals:   03/27/13 1919  BP: 96/42  Pulse: 64  Temp: 98.6 F (37 C)  Resp: 18    BP 96/42  Pulse 64  Temp(Src) 98.6 F (37 C) (Oral)  Resp 18  Ht 5\' 7"  (1.702 m)  Wt 88.905 kg (196 lb)  BMI 30.69 kg/m2  SpO2 94%  General:  Appears calm and comfortable Eyes: PERRL, normal lids, irises & conjunctiva ENT: grossly normal hearing, lips & tongue reveal some white spots Neck: no LAD, masses or thyromegaly Cardiovascular: RRR, no m/r/g. No LE edema. Telemetry: SR, no arrhythmias  Respiratory: CTA bilaterally, no w/r/r. Normal respiratory effort. Abdomen: soft, non tender Skin: no rash or induration seen on limited exam Musculoskeletal: grossly normal tone BUE/BLE Psychiatric: grossly normal mood and affect, speech fluent and appropriate Neurologic: grossly non-focal.          Labs on Admission:  Basic Metabolic Panel:  Recent Labs Lab 03/27/13 1318  NA 117*  K 3.2*  CL 72*  CO2 33*  GLUCOSE 116*  BUN 7  CREATININE 0.74  CALCIUM 9.1   Liver Function Tests: No results found for this basename: AST, ALT, ALKPHOS, BILITOT, PROT, ALBUMIN,  in the last 168 hours No results found for this basename: LIPASE, AMYLASE,  in the last 168 hours No results found for this basename: AMMONIA,  in the last 168 hours CBC:  Recent Labs Lab 03/27/13 1318  WBC 8.2  HGB 13.5  HCT 37.3  MCV 88.0  PLT 306   Cardiac Enzymes: No results found for this basename: CKTOTAL, CKMB, CKMBINDEX, TROPONINI,  in the last 168 hours  BNP (last 3 results)  Recent Labs  08/03/12 0850 10/25/12 0930  PROBNP 745.1* 209.0*   CBG: No results found for this  basename: GLUCAP,  in the last 168 hours  Radiological Exams on Admission: Ct Soft Tissue Neck W Contrast  03/27/2013   CLINICAL DATA:  Recent achalasia surgery, feeling throat closing.  EXAM: CT NECK AND CHEST WITH CONTRAST  TECHNIQUE: Multidetector CT imaging of the neck and chest was performed using the standard protocol following the bolus administration of intravenous contrast.  CONTRAST:  179mL OMNIPAQUE IOHEXOL 300 MG/ML  SOLN  COMPARISON:  CT CHEST W/O CM dated 08/02/2012  FINDINGS: CT neck: Aerodigestive tract is unremarkable for this mildly motion degraded examination. Preservation of the parapharyngeal fat tissue planes. Normal appearance of the major salivary glands. Normal appearance of the thyroid gland which extends posteriorly, a normal variant.  Somewhat medialized course of the common carotid arteries, a normal variant with mild calcific atherosclerosis of the carotid bifurcations. No lymphadenopathy by CT size criteria. No free fluid or fluid collections within the neck.  Patient is edentulous. Visualized paranasal sinuses and mastoid air cells are well aerated. Broad reversed cervical lordosis, status post C6-7 ACDF.  CT chest: Minimally patulous thoracic esophagus, with minimal debris, no suspicious esophageal distension, no air fluid levels or posterior mediastinal free air/fluid collections.  Heart and pericardium overall normal, with mild Coronary artery calcifications. Mild calcific atherosclerosis of the thoracic aorta which normal  in course and caliber. Tracheobronchial tree is patent and midline. No pneumothorax. Linear atelectasis versus scarring in the right upper/middle lobe. Resolution of tree-in-bud airspace opacities seen on prior CT. 7 mm right upper lobe pulmonary nodule, coronal 60/92, axial 21/61. No pleural effusions or focal consolidations.  Included view of the abdomen demonstrates cholecystectomy. Soft tissues and osseous structures are nonsuspicious. Straightened thoracic  kyphosis with mild degenerative change.  IMPRESSION: CT neck: No acute process within the neck, normal appearance of the aerodigestive tract.  CT chest: Minimally patulous thoracic esophagus, with no suspicious esophageal distention or air-fluid levels. No posterior mediastinal fluid collections or masses.  If symptoms persist, dynamic study status upper GI may be of added value.  No acute cardiopulmonary process.  7 mm right upper lobe pulmonary nodule: If the patient is at high risk for bronchogenic carcinoma, follow-up chest CT at 3-79months is recommended. If the patient is at low risk for bronchogenic carcinoma, follow-up chest CT at 6-12 months is recommended. This recommendation follows the consensus statement: Guidelines for Management of Small Pulmonary Nodules Detected on CT Scans: A Statement from the [place] as published in Radiology 2005; 237:395-400.   Electronically Signed   By: Elon Alas   On: 03/27/2013 16:48   Ct Chest W Contrast  03/27/2013   CLINICAL DATA:  Recent achalasia surgery, feeling throat closing.  EXAM: CT NECK AND CHEST WITH CONTRAST  TECHNIQUE: Multidetector CT imaging of the neck and chest was performed using the standard protocol following the bolus administration of intravenous contrast.  CONTRAST:  183mL OMNIPAQUE IOHEXOL 300 MG/ML  SOLN  COMPARISON:  CT CHEST W/O CM dated 08/02/2012  FINDINGS: CT neck: Aerodigestive tract is unremarkable for this mildly motion degraded examination. Preservation of the parapharyngeal fat tissue planes. Normal appearance of the major salivary glands. Normal appearance of the thyroid gland which extends posteriorly, a normal variant.  Somewhat medialized course of the common carotid arteries, a normal variant with mild calcific atherosclerosis of the carotid bifurcations. No lymphadenopathy by CT size criteria. No free fluid or fluid collections within the neck.  Patient is edentulous. Visualized paranasal sinuses and mastoid air  cells are well aerated. Broad reversed cervical lordosis, status post C6-7 ACDF.  CT chest: Minimally patulous thoracic esophagus, with minimal debris, no suspicious esophageal distension, no air fluid levels or posterior mediastinal free air/fluid collections.  Heart and pericardium overall normal, with mild Coronary artery calcifications. Mild calcific atherosclerosis of the thoracic aorta which normal in course and caliber. Tracheobronchial tree is patent and midline. No pneumothorax. Linear atelectasis versus scarring in the right upper/middle lobe. Resolution of tree-in-bud airspace opacities seen on prior CT. 7 mm right upper lobe pulmonary nodule, coronal 60/92, axial 21/61. No pleural effusions or focal consolidations.  Included view of the abdomen demonstrates cholecystectomy. Soft tissues and osseous structures are nonsuspicious. Straightened thoracic kyphosis with mild degenerative change.  IMPRESSION: CT neck: No acute process within the neck, normal appearance of the aerodigestive tract.  CT chest: Minimally patulous thoracic esophagus, with no suspicious esophageal distention or air-fluid levels. No posterior mediastinal fluid collections or masses.  If symptoms persist, dynamic study status upper GI may be of added value.  No acute cardiopulmonary process.  7 mm right upper lobe pulmonary nodule: If the patient is at high risk for bronchogenic carcinoma, follow-up chest CT at 3-18months is recommended. If the patient is at low risk for bronchogenic carcinoma, follow-up chest CT at 6-12 months is recommended. This recommendation follows the consensus statement:  Guidelines for Management of Small Pulmonary Nodules Detected on CT Scans: A Statement from the [place] as published in Radiology 2005; 237:395-400.   Electronically Signed   By: Elon Alas   On: 03/27/2013 16:48    Assessment/Plan Principal Problem:   Hyponatremia Active Problems:   Tobacco use   Dysphagia   Chalasia  of lower esophageal sphincter   1. Hyponatremia -may be related to poor oral intake -will start on IV fluids in the form of normal saline -monitor electrolytes  2. Hypokalemia -will replete along with the sodium  3. Dysphagia -unclear if she may infection to explain the difficulty swallowing -would get a GI consultation to evaluate -place of liquid diet as tolerated  4. Addisons Disease -on chronic prednisone -given stress dose steroids in ED  5. Tobacco Use -counseling provided on smoking cessation   Code Status: FULL CODE (must indicate code status--if unknown or must be presumed, indicate so) Family Communication: No family members present(indicate person spoken with, if applicable, with phone number if by telephone) Disposition Plan: Home (indicate anticipated LOS)  Time spent: 71min  KHAN,SAADAT A [facility] Pager [phone]

## 2013-03-27 NOTE — ED Notes (Signed)
Tomi Bamberger, MD made aware of pt's Na+ level.

## 2013-03-27 NOTE — Telephone Encounter (Signed)
To KD.

## 2013-03-27 NOTE — ED Notes (Signed)
Patient assisted to restroom at this time.

## 2013-03-27 NOTE — Telephone Encounter (Signed)
noted 

## 2013-03-28 DIAGNOSIS — J449 Chronic obstructive pulmonary disease, unspecified: Secondary | ICD-10-CM

## 2013-03-28 DIAGNOSIS — E2749 Other adrenocortical insufficiency: Secondary | ICD-10-CM

## 2013-03-28 DIAGNOSIS — J4489 Other specified chronic obstructive pulmonary disease: Secondary | ICD-10-CM

## 2013-03-28 LAB — COMPREHENSIVE METABOLIC PANEL
ALT: 15 U/L (ref 0–35)
AST: 16 U/L (ref 0–37)
Albumin: 3.1 g/dL — ABNORMAL LOW (ref 3.5–5.2)
Alkaline Phosphatase: 65 U/L (ref 39–117)
BUN: 6 mg/dL (ref 6–23)
CO2: 33 meq/L — AB (ref 19–32)
Calcium: 8.4 mg/dL (ref 8.4–10.5)
Chloride: 89 mEq/L — ABNORMAL LOW (ref 96–112)
Creatinine, Ser: 0.72 mg/dL (ref 0.50–1.10)
GFR calc non Af Amer: >90 mL/min (ref >90)
Glucose, Bld: 109 mg/dL — ABNORMAL HIGH (ref 70–99)
Potassium: 3.2 mEq/L — ABNORMAL LOW (ref 3.7–5.3)
SODIUM: 130 meq/L — AB (ref 137–147)
Total Bilirubin: 0.3 mg/dL (ref 0.3–1.2)
Total Protein: 6.4 g/dL (ref 6.0–8.3)

## 2013-03-28 LAB — CBC
HCT: 34.7 % — ABNORMAL LOW (ref 36.0–46.0)
HEMOGLOBIN: 12.2 g/dL (ref 12.0–15.0)
MCH: 31.7 pg (ref 26.0–34.0)
MCHC: 35.2 g/dL (ref 30.0–36.0)
MCV: 90.1 fL (ref 78.0–100.0)
Platelets: 299 10*3/uL (ref 150–400)
RBC: 3.85 MIL/uL — ABNORMAL LOW (ref 3.87–5.11)
RDW: 13.5 % (ref 11.5–15.5)
WBC: 6.1 10*3/uL (ref 4.0–10.5)

## 2013-03-28 LAB — TSH: TSH: 1.722 u[IU]/mL (ref 0.350–4.500)

## 2013-03-28 LAB — CORTISOL: Cortisol, Plasma: 15.9 ug/dL

## 2013-03-28 LAB — OSMOLALITY: Osmolality: 234 mOsm/kg — ABNORMAL LOW (ref 275–300)

## 2013-03-28 LAB — T4: T4, Total: 9.4 ug/dL (ref 5.0–12.5)

## 2013-03-28 MED ORDER — POTASSIUM CHLORIDE 20 MEQ/15ML (10%) PO LIQD
40.0000 meq | ORAL | Status: AC
  Administered 2013-03-28 (×2): 40 meq via ORAL
  Filled 2013-03-28 (×2): qty 30

## 2013-03-28 MED ORDER — DIPHENHYDRAMINE HCL 25 MG PO CAPS
25.0000 mg | ORAL_CAPSULE | Freq: Four times a day (QID) | ORAL | Status: DC | PRN
  Administered 2013-03-28: 25 mg via ORAL
  Filled 2013-03-28: qty 1

## 2013-03-28 NOTE — Progress Notes (Signed)
[facility] PROGRESS NOTE  [name] MWU:[id number] DOB: 10-29-1955 DOA: 03/27/2013 PCP: Vic Blackbird, MD  Assessment/Plan: 1. Hyponatremia. Possibly related to poor oral intake. Improved with IV fluids. Continue current treatments.  2. Dysphagia. Patient recently had surgery for achalasia. CT of the neck/chest was unremarkable for any GI pathology. No signs of infection. The patient is feeling better today. Advance to full liquid diet. 3. Addison's disease. Chronically on prednisone. Blood pressure stable. 4. Hypokalemia. Replace 5. Right upper lobe pulmonary nodule. We'll need outpatient workup/surveillance.  Code Status: full code Family Communication: discussed with patient Disposition Plan: discharge home once improved   Consultants:    Procedures:    Antibiotics:    HPI/Subjective: Feeling better today.  No pain with swallowing. Mouth feels better  Objective: Filed Vitals:   03/28/13 1408  BP: 137/73  Pulse: 71  Temp: 97.6 F (36.4 C)  Resp: 18    Intake/Output Summary (Last 24 hours) at 03/28/13 1500 Last data filed at 03/28/13 1245  Gross per 24 hour  Intake    480 ml  Output      0 ml  Net    480 ml   Filed Weights   03/27/13 1209 03/27/13 2148  Weight: 88.905 kg (196 lb) 92.806 kg (204 lb 9.6 oz)    Exam:   General:  NAD  Cardiovascular: s1, s2, rrr  Respiratory: cta b  Abdomen: soft, nt, nd, bs+  Musculoskeletal: no edema b/l  Data Reviewed: Basic Metabolic Panel:  Recent Labs Lab 03/27/13 1318 03/28/13 0432  NA 117* 130*  K 3.2* 3.2*  CL 72* 89*  CO2 33* 33*  GLUCOSE 116* 109*  BUN 7 6  CREATININE 0.74 0.72  CALCIUM 9.1 8.4   Liver Function Tests:  Recent Labs Lab 03/28/13 0432  AST 16  ALT 15  ALKPHOS 65  BILITOT 0.3  PROT 6.4  ALBUMIN 3.1*   No results found for this basename: LIPASE, AMYLASE,  in the last 168 hours No results found for this basename: AMMONIA,  in the last 168  hours CBC:  Recent Labs Lab 03/27/13 1318 03/28/13 0432  WBC 8.2 6.1  HGB 13.5 12.2  HCT 37.3 34.7*  MCV 88.0 90.1  PLT 306 299   Cardiac Enzymes: No results found for this basename: CKTOTAL, CKMB, CKMBINDEX, TROPONINI,  in the last 168 hours BNP (last 3 results)  Recent Labs  08/03/12 0850 10/25/12 0930  PROBNP 745.1* 209.0*   CBG: No results found for this basename: GLUCAP,  in the last 168 hours  Recent Results (from the past 240 hour(s))  KOH PREP     Status: None   Collection Time    03/27/13  6:15 PM      Result Value Ref Range Status   Specimen Description THROAT   Final   Special Requests NONE   Final   KOH Prep FEW YEAST   Final   Report Status 03/27/2013 FINAL   Final     Studies: Ct Soft Tissue Neck W Contrast  03/27/2013   CLINICAL DATA:  Recent achalasia surgery, feeling throat closing.  EXAM: CT NECK AND CHEST WITH CONTRAST  TECHNIQUE: Multidetector CT imaging of the neck and chest was performed using the standard protocol following the bolus administration of intravenous contrast.  CONTRAST:  114mL OMNIPAQUE IOHEXOL 300 MG/ML  SOLN  COMPARISON:  CT CHEST W/O CM dated 08/02/2012  FINDINGS: CT neck: Aerodigestive tract is unremarkable for this mildly motion degraded examination. Preservation of the  parapharyngeal fat tissue planes. Normal appearance of the major salivary glands. Normal appearance of the thyroid gland which extends posteriorly, a normal variant.  Somewhat medialized course of the common carotid arteries, a normal variant with mild calcific atherosclerosis of the carotid bifurcations. No lymphadenopathy by CT size criteria. No free fluid or fluid collections within the neck.  Patient is edentulous. Visualized paranasal sinuses and mastoid air cells are well aerated. Broad reversed cervical lordosis, status post C6-7 ACDF.  CT chest: Minimally patulous thoracic esophagus, with minimal debris, no suspicious esophageal distension, no air fluid levels or  posterior mediastinal free air/fluid collections.  Heart and pericardium overall normal, with mild Coronary artery calcifications. Mild calcific atherosclerosis of the thoracic aorta which normal in course and caliber. Tracheobronchial tree is patent and midline. No pneumothorax. Linear atelectasis versus scarring in the right upper/middle lobe. Resolution of tree-in-bud airspace opacities seen on prior CT. 7 mm right upper lobe pulmonary nodule, coronal 60/92, axial 21/61. No pleural effusions or focal consolidations.  Included view of the abdomen demonstrates cholecystectomy. Soft tissues and osseous structures are nonsuspicious. Straightened thoracic kyphosis with mild degenerative change.  IMPRESSION: CT neck: No acute process within the neck, normal appearance of the aerodigestive tract.  CT chest: Minimally patulous thoracic esophagus, with no suspicious esophageal distention or air-fluid levels. No posterior mediastinal fluid collections or masses.  If symptoms persist, dynamic study status upper GI may be of added value.  No acute cardiopulmonary process.  7 mm right upper lobe pulmonary nodule: If the patient is at high risk for bronchogenic carcinoma, follow-up chest CT at 3-61months is recommended. If the patient is at low risk for bronchogenic carcinoma, follow-up chest CT at 6-12 months is recommended. This recommendation follows the consensus statement: Guidelines for Management of Small Pulmonary Nodules Detected on CT Scans: A Statement from the [place] as published in Radiology 2005; 237:395-400.   Electronically Signed   By: Elon Alas   On: 03/27/2013 16:48   Ct Chest W Contrast  03/27/2013   CLINICAL DATA:  Recent achalasia surgery, feeling throat closing.  EXAM: CT NECK AND CHEST WITH CONTRAST  TECHNIQUE: Multidetector CT imaging of the neck and chest was performed using the standard protocol following the bolus administration of intravenous contrast.  CONTRAST:  182mL  OMNIPAQUE IOHEXOL 300 MG/ML  SOLN  COMPARISON:  CT CHEST W/O CM dated 08/02/2012  FINDINGS: CT neck: Aerodigestive tract is unremarkable for this mildly motion degraded examination. Preservation of the parapharyngeal fat tissue planes. Normal appearance of the major salivary glands. Normal appearance of the thyroid gland which extends posteriorly, a normal variant.  Somewhat medialized course of the common carotid arteries, a normal variant with mild calcific atherosclerosis of the carotid bifurcations. No lymphadenopathy by CT size criteria. No free fluid or fluid collections within the neck.  Patient is edentulous. Visualized paranasal sinuses and mastoid air cells are well aerated. Broad reversed cervical lordosis, status post C6-7 ACDF.  CT chest: Minimally patulous thoracic esophagus, with minimal debris, no suspicious esophageal distension, no air fluid levels or posterior mediastinal free air/fluid collections.  Heart and pericardium overall normal, with mild Coronary artery calcifications. Mild calcific atherosclerosis of the thoracic aorta which normal in course and caliber. Tracheobronchial tree is patent and midline. No pneumothorax. Linear atelectasis versus scarring in the right upper/middle lobe. Resolution of tree-in-bud airspace opacities seen on prior CT. 7 mm right upper lobe pulmonary nodule, coronal 60/92, axial 21/61. No pleural effusions or focal consolidations.  Included view of  the abdomen demonstrates cholecystectomy. Soft tissues and osseous structures are nonsuspicious. Straightened thoracic kyphosis with mild degenerative change.  IMPRESSION: CT neck: No acute process within the neck, normal appearance of the aerodigestive tract.  CT chest: Minimally patulous thoracic esophagus, with no suspicious esophageal distention or air-fluid levels. No posterior mediastinal fluid collections or masses.  If symptoms persist, dynamic study status upper GI may be of added value.  No acute  cardiopulmonary process.  7 mm right upper lobe pulmonary nodule: If the patient is at high risk for bronchogenic carcinoma, follow-up chest CT at 3-47months is recommended. If the patient is at low risk for bronchogenic carcinoma, follow-up chest CT at 6-12 months is recommended. This recommendation follows the consensus statement: Guidelines for Management of Small Pulmonary Nodules Detected on CT Scans: A Statement from the [place] as published in Radiology 2005; 237:395-400.   Electronically Signed   By: Elon Alas   On: 03/27/2013 16:48    Scheduled Meds: . amitriptyline  100 mg Oral QHS  . baclofen  10 mg Oral BID  . clonazePAM  1 mg Oral QID  . DULoxetine  60 mg Oral q morning - 10a  . escitalopram  20 mg Oral Daily  . fluocinonide gel  1 application Topical TID  . heparin  5,000 Units Subcutaneous 3 times per day  . hydrochlorothiazide  25 mg Oral q morning - 10a  . losartan  50 mg Oral q morning - 10a  . nicotine  21 mg Transdermal Daily  . oxybutynin  10 mg Oral Daily  . potassium chloride  40 mEq Oral Q3H  . predniSONE  7.5 mg Oral Q breakfast  . propranolol  10 mg Oral 3 times per day  . simvastatin  40 mg Oral q1800  . sodium chloride  3 mL Intravenous Q12H  . tiotropium  18 mcg Inhalation QHS   Continuous Infusions: . sodium chloride 125 mL/hr at 03/28/13 0112  . 0.9 % NaCl with KCl 20 mEq / L 75 mL/hr at 03/28/13 3343    Principal Problem:   Hyponatremia Active Problems:   Tobacco use   Dysphagia   Chalasia of lower esophageal sphincter    Time spent: 34mins    MEMON,[email or web address]  [facility] Pager [phone]. If 7PM-7AM, please contact night-coverage at [email or web address], password [facility] 03/28/2013, 3:00 PM  LOS: 1 day

## 2013-03-29 DIAGNOSIS — I10 Essential (primary) hypertension: Secondary | ICD-10-CM

## 2013-03-29 LAB — BASIC METABOLIC PANEL
BUN: 5 mg/dL — ABNORMAL LOW (ref 6–23)
CO2: 32 mEq/L (ref 19–32)
Calcium: 8.7 mg/dL (ref 8.4–10.5)
Chloride: 93 mEq/L — ABNORMAL LOW (ref 96–112)
Creatinine, Ser: 0.76 mg/dL (ref 0.50–1.10)
GFR calc non Af Amer: >90 mL/min (ref >90)
GLUCOSE: 94 mg/dL (ref 70–99)
POTASSIUM: 3.9 meq/L (ref 3.7–5.3)
SODIUM: 132 meq/L — AB (ref 137–147)

## 2013-03-29 MED ORDER — OXYCODONE-ACETAMINOPHEN 5-325 MG PO TABS
1.0000 | ORAL_TABLET | Freq: Four times a day (QID) | ORAL | Status: DC | PRN
  Administered 2013-03-29: 1 via ORAL

## 2013-03-29 MED ORDER — BACLOFEN 10 MG PO TABS: 10.0000 mg | ORAL_TABLET | Freq: Three times a day (TID) | ORAL | Status: DC

## 2013-03-29 MED ORDER — OXYCODONE HCL 5 MG PO TABS
5.0000 mg | ORAL_TABLET | Freq: Four times a day (QID) | ORAL | Status: DC | PRN
  Administered 2013-03-29: 5 mg via ORAL
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
## 2013-03-29 NOTE — Progress Notes (Signed)
Patient states understanding of discharge instructions, prescription given. 

## 2013-03-29 NOTE — Discharge Instructions (Signed)
Hyponatremia   Hyponatremia is when the amount of salt (sodium) in your blood is too low. When sodium levels are low, your cells will absorb extra water and swell. The swelling happens throughout the body, but it mostly affects the brain. Severe brain swelling (cerebral edema), seizures, or coma can happen.   CAUSES    Heart, kidney, or liver problems.   Thyroid problems.   Adrenal gland problems.   Severe vomiting and diarrhea.   Certain medicines or illegal drugs.   Dehydration.   Drinking too much water.   Low-sodium diet.  SYMPTOMS    Nausea and vomiting.   Confusion.   Lethargy.   Agitation.   Headache.   Twitching or shaking (seizures).   Unconsciousness.   Appetite loss.   Muscle weakness and cramping.  DIAGNOSIS   Hyponatremia is identified by a simple blood test. Your caregiver will perform a history and physical exam to try to find the cause and type of hyponatremia. Other tests may be needed to measure the amount of sodium in your blood and urine.  TREATMENT   Treatment will depend on the cause.    Fluids may be given through the vein (IV).   Medicines may be used to correct the sodium imbalance. If medicines are causing the problem, they will need to be adjusted.   Water or fluid intake may be restricted to restore proper balance.  The speed of correcting the sodium problem is very important. If the problem is corrected too fast, nerve damage (sometimes unchangeable) can happen.  HOME CARE INSTRUCTIONS    Only take medicines as directed by your caregiver. Many medicines can make hyponatremia worse. Discuss all your medicines with your caregiver.   Carefully follow any recommended diet, including any fluid restrictions.   You may be asked to repeat lab tests. Follow these directions.   Avoid alcohol and recreational drugs.  SEEK MEDICAL CARE IF:    You develop worsening nausea, fatigue, headache, confusion, or weakness.   Your original hyponatremia symptoms return.   You have  problems following the recommended diet.  SEEK IMMEDIATE MEDICAL CARE IF:    You have a seizure.   You faint.   You have ongoing diarrhea or vomiting.  MAKE SURE YOU:    Understand these instructions.   Will watch your condition.   Will get help right away if you are not doing well or get worse.  Document Released: 12/08/2001 Document Revised: 03/12/2011 Document Reviewed: 06/04/2010  [employer] Patient Information 2014 [employer].

## 2013-03-30 NOTE — Care Management Utilization Note (Signed)
UR completed 

## 2013-03-31 ENCOUNTER — Inpatient Hospital Stay (HOSPITAL_COMMUNITY)
Admission: EM | Admit: 2013-03-31 | Discharge: 2013-04-02 | DRG: 071 | Disposition: A | Discharge status: Home / Self Care | Payer: Medicare Other | Attending: Internal Medicine | Admitting: Internal Medicine

## 2013-03-31 ENCOUNTER — Encounter (HOSPITAL_COMMUNITY): Payer: Self-pay | Admitting: Emergency Medicine

## 2013-03-31 ENCOUNTER — Ambulatory Visit: Payer: Medicaid Other | Admitting: Obstetrics and Gynecology

## 2013-03-31 DIAGNOSIS — R490 Dysphonia: Secondary | ICD-10-CM

## 2013-03-31 DIAGNOSIS — R233 Spontaneous ecchymoses: Secondary | ICD-10-CM

## 2013-03-31 DIAGNOSIS — I959 Hypotension, unspecified: Secondary | ICD-10-CM | POA: Diagnosis present

## 2013-03-31 DIAGNOSIS — R7989 Other specified abnormal findings of blood chemistry: Secondary | ICD-10-CM

## 2013-03-31 DIAGNOSIS — E871 Hypo-osmolality and hyponatremia: Secondary | ICD-10-CM | POA: Diagnosis present

## 2013-03-31 DIAGNOSIS — W19XXXA Unspecified fall, initial encounter: Secondary | ICD-10-CM

## 2013-03-31 DIAGNOSIS — R1310 Dysphagia, unspecified: Secondary | ICD-10-CM

## 2013-03-31 DIAGNOSIS — G9340 Encephalopathy, unspecified: Principal | ICD-10-CM | POA: Diagnosis present

## 2013-03-31 DIAGNOSIS — R471 Dysarthria and anarthria: Secondary | ICD-10-CM

## 2013-03-31 DIAGNOSIS — K120 Recurrent oral aphthae: Secondary | ICD-10-CM

## 2013-03-31 DIAGNOSIS — M5137 Other intervertebral disc degeneration, lumbosacral region: Secondary | ICD-10-CM | POA: Diagnosis present

## 2013-03-31 DIAGNOSIS — R002 Palpitations: Secondary | ICD-10-CM

## 2013-03-31 DIAGNOSIS — M549 Dorsalgia, unspecified: Secondary | ICD-10-CM

## 2013-03-31 DIAGNOSIS — F319 Bipolar disorder, unspecified: Secondary | ICD-10-CM | POA: Diagnosis present

## 2013-03-31 DIAGNOSIS — F17200 Nicotine dependence, unspecified, uncomplicated: Secondary | ICD-10-CM | POA: Diagnosis present

## 2013-03-31 DIAGNOSIS — Z85820 Personal history of malignant melanoma of skin: Secondary | ICD-10-CM

## 2013-03-31 DIAGNOSIS — E876 Hypokalemia: Secondary | ICD-10-CM | POA: Diagnosis present

## 2013-03-31 DIAGNOSIS — J310 Chronic rhinitis: Secondary | ICD-10-CM

## 2013-03-31 DIAGNOSIS — F411 Generalized anxiety disorder: Secondary | ICD-10-CM | POA: Diagnosis present

## 2013-03-31 DIAGNOSIS — K219 Gastro-esophageal reflux disease without esophagitis: Secondary | ICD-10-CM

## 2013-03-31 DIAGNOSIS — R531 Weakness: Secondary | ICD-10-CM | POA: Diagnosis present

## 2013-03-31 DIAGNOSIS — Z8249 Family history of ischemic heart disease and other diseases of the circulatory system: Secondary | ICD-10-CM

## 2013-03-31 DIAGNOSIS — E785 Hyperlipidemia, unspecified: Secondary | ICD-10-CM | POA: Diagnosis present

## 2013-03-31 DIAGNOSIS — K222 Esophageal obstruction: Secondary | ICD-10-CM

## 2013-03-31 DIAGNOSIS — Z801 Family history of malignant neoplasm of trachea, bronchus and lung: Secondary | ICD-10-CM

## 2013-03-31 DIAGNOSIS — R748 Abnormal levels of other serum enzymes: Secondary | ICD-10-CM | POA: Diagnosis present

## 2013-03-31 DIAGNOSIS — I10 Essential (primary) hypertension: Secondary | ICD-10-CM

## 2013-03-31 DIAGNOSIS — R32 Unspecified urinary incontinence: Secondary | ICD-10-CM

## 2013-03-31 DIAGNOSIS — M51379 Other intervertebral disc degeneration, lumbosacral region without mention of lumbar back pain or lower extremity pain: Secondary | ICD-10-CM | POA: Diagnosis present

## 2013-03-31 DIAGNOSIS — E2749 Other adrenocortical insufficiency: Secondary | ICD-10-CM | POA: Diagnosis present

## 2013-03-31 DIAGNOSIS — Z8673 Personal history of transient ischemic attack (TIA), and cerebral infarction without residual deficits: Secondary | ICD-10-CM

## 2013-03-31 DIAGNOSIS — F329 Major depressive disorder, single episode, unspecified: Secondary | ICD-10-CM

## 2013-03-31 DIAGNOSIS — E2740 Unspecified adrenocortical insufficiency: Secondary | ICD-10-CM | POA: Diagnosis present

## 2013-03-31 DIAGNOSIS — R609 Edema, unspecified: Secondary | ICD-10-CM

## 2013-03-31 DIAGNOSIS — Z79899 Other long term (current) drug therapy: Secondary | ICD-10-CM

## 2013-03-31 DIAGNOSIS — F32A Depression, unspecified: Secondary | ICD-10-CM

## 2013-03-31 DIAGNOSIS — I6529 Occlusion and stenosis of unspecified carotid artery: Secondary | ICD-10-CM

## 2013-03-31 DIAGNOSIS — I5189 Other ill-defined heart diseases: Secondary | ICD-10-CM

## 2013-03-31 DIAGNOSIS — K0889 Other specified disorders of teeth and supporting structures: Secondary | ICD-10-CM

## 2013-03-31 DIAGNOSIS — R232 Flushing: Secondary | ICD-10-CM

## 2013-03-31 DIAGNOSIS — J449 Chronic obstructive pulmonary disease, unspecified: Secondary | ICD-10-CM

## 2013-03-31 DIAGNOSIS — R269 Unspecified abnormalities of gait and mobility: Secondary | ICD-10-CM

## 2013-03-31 DIAGNOSIS — J4489 Other specified chronic obstructive pulmonary disease: Secondary | ICD-10-CM | POA: Diagnosis present

## 2013-03-31 DIAGNOSIS — G459 Transient cerebral ischemic attack, unspecified: Secondary | ICD-10-CM

## 2013-03-31 DIAGNOSIS — Z720 Tobacco use: Secondary | ICD-10-CM

## 2013-03-31 DIAGNOSIS — G8929 Other chronic pain: Secondary | ICD-10-CM | POA: Diagnosis present

## 2013-03-31 DIAGNOSIS — R778 Other specified abnormalities of plasma proteins: Secondary | ICD-10-CM

## 2013-03-31 DIAGNOSIS — M47812 Spondylosis without myelopathy or radiculopathy, cervical region: Secondary | ICD-10-CM

## 2013-03-31 LAB — COMPREHENSIVE METABOLIC PANEL
ALBUMIN: 3.3 g/dL — AB (ref 3.5–5.2)
ALK PHOS: 67 U/L (ref 39–117)
ALT: 18 U/L (ref 0–35)
AST: 16 U/L (ref 0–37)
BILIRUBIN TOTAL: 0.4 mg/dL (ref 0.3–1.2)
BUN: 9 mg/dL (ref 6–23)
CHLORIDE: 81 meq/L — AB (ref 96–112)
CO2: 30 mEq/L (ref 19–32)
Calcium: 8.8 mg/dL (ref 8.4–10.5)
Creatinine, Ser: 0.73 mg/dL (ref 0.50–1.10)
GFR calc Af Amer: >90 mL/min (ref >90)
GFR calc non Af Amer: >90 mL/min (ref >90)
Glucose, Bld: 104 mg/dL — ABNORMAL HIGH (ref 70–99)
Potassium: 3.3 mEq/L — ABNORMAL LOW (ref 3.7–5.3)
Sodium: 122 mEq/L — ABNORMAL LOW (ref 137–147)
Total Protein: 6.8 g/dL (ref 6.0–8.3)

## 2013-03-31 LAB — CBC WITH DIFFERENTIAL/PLATELET
BASOS ABS: 0 10*3/uL (ref 0.0–0.1)
Basophils Relative: 0 % (ref 0–1)
Eosinophils Absolute: 0.3 10*3/uL (ref 0.0–0.7)
Eosinophils Relative: 5 % (ref 0–5)
HCT: 37.1 % (ref 36.0–46.0)
Hemoglobin: 13 g/dL (ref 12.0–15.0)
Lymphocytes Relative: 22 % (ref 12–46)
Lymphs Abs: 1.1 10*3/uL (ref 0.7–4.0)
MCH: 31.5 pg (ref 26.0–34.0)
MCHC: 35 g/dL (ref 30.0–36.0)
MCV: 89.8 fL (ref 78.0–100.0)
Monocytes Absolute: 0.4 10*3/uL (ref 0.1–1.0)
Monocytes Relative: 8 % (ref 3–12)
NEUTROS ABS: 3.3 10*3/uL (ref 1.7–7.7)
NEUTROS PCT: 65 % (ref 43–77)
PLATELETS: 251 10*3/uL (ref 150–400)
RBC: 4.13 MIL/uL (ref 3.87–5.11)
RDW: 13.4 % (ref 11.5–15.5)
WBC: 5 10*3/uL (ref 4.0–10.5)

## 2013-03-31 LAB — URINALYSIS, ROUTINE W REFLEX MICROSCOPIC
Bilirubin Urine: NEGATIVE
GLUCOSE, UA: NEGATIVE mg/dL
KETONES UR: NEGATIVE mg/dL
Leukocytes, UA: NEGATIVE
Nitrite: NEGATIVE
Protein, ur: NEGATIVE mg/dL
Specific Gravity, Urine: 1.01 (ref 1.005–1.030)
Urobilinogen, UA: 0.2 mg/dL (ref 0.0–1.0)
pH: 7 (ref 5.0–8.0)

## 2013-03-31 LAB — URINE MICROSCOPIC-ADD ON

## 2013-03-31 MED ORDER — SODIUM CHLORIDE 0.9 % IV SOLN
1000.0000 mL | INTRAVENOUS | Status: DC
  Administered 2013-04-01: 1000 mL via INTRAVENOUS

## 2013-03-31 MED ORDER — DIPHENHYDRAMINE HCL 50 MG/ML IJ SOLN
25.0000 mg | Freq: Once | INTRAMUSCULAR | Status: AC
  Administered 2013-03-31: 25 mg via INTRAVENOUS
  Filled 2013-03-31: qty 1

## 2013-03-31 MED ORDER — SODIUM CHLORIDE 0.9 % IV SOLN
1000.0000 mL | Freq: Once | INTRAVENOUS | Status: AC
  Administered 2013-03-31: 1000 mL via INTRAVENOUS

## 2013-03-31 MED ORDER — METOCLOPRAMIDE HCL 5 MG/ML IJ SOLN
10.0000 mg | Freq: Once | INTRAMUSCULAR | Status: AC
  Administered 2013-03-31: 10 mg via INTRAVENOUS
  Filled 2013-03-31: qty 2

## 2013-03-31 NOTE — ED Notes (Addendum)
Pt says her Na and and K are low. Admitted here recently and has felt bad since left the hospital. Headache, and neck pain, Nausea. Out of percocet, Had appt with pain clinic , but missed it. Last percocet was  Yesterday.

## 2013-03-31 NOTE — ED Notes (Signed)
Easily ambulatory to bathroom and back

## 2013-03-31 NOTE — ED Provider Notes (Signed)
CSN: [id number]     Arrival date & time 03/31/13  2132 History  This chart was scribed for Tonya Fuel, MD by Rolanda Lundborg, ED Scribe. This patient was seen in room APA03/APA03 and the patient's care was started at 11:20 PM.   Chief Complaint  Patient presents with  . Weakness     (Consider location/radiation/quality/duration/timing/severity/associated sxs/prior Treatment) The history is provided by the patient. No language interpreter was used.   HPI Comments: Tonya Sullivan is a 58 y.o. female who presents to the Emergency Department complaining of gradually-worsening generalized weakness and headache at the forehead vertex and occipital area going into the neck onset 2-3 days ago. She also reports nausea and generalized body aches. Pt was admitted here a few days ago and states she has felt poorly since being discharged. She states she has been eating a nutritional diet and restricting fluids per her discharge instructions but is not feeling any better. She denies constipation, diarrhea, dysuria, fever, chills, vomiting. She states she had an appointment with the pain clinic but missed it due to feeling poorly.    Past Medical History  Diagnosis Date  . COPD (chronic obstructive pulmonary disease)   . Depression   . Chronic back pain     DDD, disc bulge, radiculopathy, spinal stenosis  . Hyperlipidemia   . Anxiety   . Bipolar disorder, unspecified   . Carotid artery calcification   . DDD (degenerative disc disease), lumbosacral   . Spinal stenosis, lumbar   . Thyroid disease   . Autoimmune thyroiditis   . Hypertension   . TIA (transient ischemic attack)     august 2014  . GERD (gastroesophageal reflux disease)   . H/O dizziness   . Neuropathic pain   . Adrenal gland dysfunction     Addison's disease ON DAILY PREDNISONE  . Addison's disease   . Stroke     HX OF MINI STROKE AUG 2014- DROPPIN EVERYTHING, SPEECH SLURRED, MEMORY AFFECTED --ALL RESOLVED - NO DEFICITS NOW  .  Pneumonia     LAST PNEUMONIA WAS AUG 2014  . Achalasia   . Mouth ulcers   . Dysrhythmia     palpitations  . Melanoma     removed 1 week ago at Dr Teryl Lucy office   Past Surgical History  Procedure Laterality Date  . Appendectomy    . Cholecystectomy    . Lump left breast      removed- benign  . Abdominal hysterectomy      tubal pregnancy  . Inguinal hernia repair    . Stomach surgery      ?holes in esophgous  . Ectopic pregnancy surgery    . Back surgery      x 5;1984;1989;;1999;2000;2010  . Colonoscopy  01/25/2012    Procedure: COLONOSCOPY;  Surgeon: Rogene Houston, MD;  Location: [facility];  Service: Endoscopy;  Laterality: N/A;  130  . Esophagogastroduodenoscopy (egd) with esophageal dilation N/A 08/13/2012    Procedure: ESOPHAGOGASTRODUODENOSCOPY (EGD) WITH ESOPHAGEAL DILATION;  Surgeon: Rogene Houston, MD;  Location: [facility];  Service: Endoscopy;  Laterality: N/A;  315  . Breast surgery      left breast  . Anterior cervical decomp/discectomy fusion N/A 10/21/2012    Procedure: Cervical Six-Seven Anterior cervical decompression/diskectomy/fusion;  Surgeon: Kristeen Miss, MD;  Location: [facility];  Service: Neurosurgery;  Laterality: N/A;  Cervical Six-Seven Anterior cervical decompression/diskectomy/fusion  . Cataract extraction w/phaco Right 11/25/2012    Procedure: CATARACT EXTRACTION PHACO AND INTRAOCULAR  LENS PLACEMENT (IOC);  Surgeon: Elta Guadeloupe T. Gershon Crane, MD;  Location: [facility];  Service: Ophthalmology;  Laterality: Right;  CDE:10.06  . Cataract extraction w/phaco Left 12/09/2012    Procedure: CATARACT EXTRACTION PHACO AND INTRAOCULAR LENS PLACEMENT (IOC);  Surgeon: Elta Guadeloupe T. Gershon Crane, MD;  Location: [facility];  Service: Ophthalmology;  Laterality: Left;  CDE:5.06  . Tonsillectomy    . Hiatal hernia repair    . Heller myotomy N/A 03/17/2013    Procedure: DIAGNOSTIC LAPAROSCOPY, LAPAROSCOPIC HELLER MYOTOMY, ENDOSCOPY, DOR FUNDOPLICATION;  Surgeon: Ralene Ok, MD;   Location: [facility];  Service: General;  Laterality: N/A;   Family History  Problem Relation Age of Onset  . Hypertension Mother   . Heart disease Mother   . Heart attack Mother   . Anxiety disorder Mother   . Cancer Father     lung cancer  . Heart attack Father   . Alcohol abuse Father   . Hypertension Brother   . Alcohol abuse Brother   . Bipolar disorder Brother   . ADD / ADHD Brother   . Drug abuse Brother   . OCD Brother   . Alcohol abuse Sister   . Bipolar disorder Sister   . Anxiety disorder Sister   . ADD / ADHD Sister   . Drug abuse Sister   . Physical abuse Sister   . Sexual abuse Sister   . Alcohol abuse Brother   . Bipolar disorder Brother   . ADD / ADHD Brother   . Drug abuse Brother   . Alcohol abuse Sister   . Bipolar disorder Sister   . Anxiety disorder Sister   . ADD / ADHD Sister   . Physical abuse Sister   . Sexual abuse Sister   . Alcohol abuse Brother   . Bipolar disorder Brother   . ADD / ADHD Brother   . Bipolar disorder Brother   . ADD / ADHD Brother   . Alcohol abuse Brother   . Paranoid behavior Brother   . Physical abuse Brother   . Sexual abuse Brother   . Bipolar disorder Maternal Aunt   . Bipolar disorder Paternal Aunt   . Bipolar disorder Maternal Uncle   . Bipolar disorder Paternal Uncle   . Bipolar disorder Maternal Grandfather   . Alcohol abuse Maternal Grandfather   . Bipolar disorder Maternal Grandmother   . Alcohol abuse Maternal Grandmother   . Anxiety disorder Maternal Grandmother   . Dementia Maternal Grandmother   . Bipolar disorder Paternal Grandfather   . Alcohol abuse Paternal Grandfather   . Bipolar disorder Paternal Grandmother   . Alcohol abuse Paternal Grandmother   . Drug abuse Paternal Grandmother   . Bipolar disorder Maternal Uncle   . Schizophrenia Neg Hx   . Seizures Neg Hx    History  Substance Use Topics  . Smoking status: Current Every Day Smoker -- 0.25 packs/day for 40 years    Types: Cigarettes   . Smokeless tobacco: Never Used     Comment: 5 cigs day 03/12/13  . Alcohol Use: No   OB History   Grav Para Term Preterm Abortions TAB SAB Ect Mult Living   2 1 1  1   1  1      Review of Systems  Constitutional: Negative for fever and chills.  Gastrointestinal: Positive for nausea. Negative for vomiting, diarrhea and constipation.  Genitourinary: Negative for dysuria.  Musculoskeletal: Positive for myalgias.  Neurological: Positive for weakness.  All other systems reviewed and are  negative.      Allergies  Codeine; Cyclobenzaprine; Darvocet; Abilify; Ace inhibitors; Adhesive; Gabapentin; Iodine; Levofloxacin; Sulfa antibiotics; and Robaxin  Home Medications   Current Outpatient Rx  Name  Route  Sig  Dispense  Refill  . albuterol (PROAIR HFA) 108 (90 BASE) MCG/ACT inhaler   Inhalation   Inhale 2 puffs into the lungs every 6 (six) hours as needed for wheezing or shortness of breath.   1 Inhaler   3   . albuterol (PROVENTIL) (2.5 MG/3ML) 0.083% nebulizer solution   Nebulization   Take 3 mLs (2.5 mg total) by nebulization every 4 (four) hours as needed for wheezing.   75 mL   3   . amitriptyline (ELAVIL) 100 MG tablet   Oral   Take 1 tablet (100 mg total) by mouth at bedtime.   30 tablet   2   . anti-nausea (EMETROL) solution   Oral   Take 10 mLs by mouth every 15 (fifteen) minutes as needed for nausea or vomiting.         . baclofen (LIORESAL) 10 MG tablet   Oral   Take 1 tablet (10 mg total) by mouth 3 (three) times daily.   20 each   0   . clonazePAM (KLONOPIN) 1 MG tablet   Oral   Take 1 tablet (1 mg total) by mouth 4 (four) times daily.   120 tablet   2   . DULoxetine (CYMBALTA) 60 MG capsule   Oral   Take 1 capsule (60 mg total) by mouth every morning.   30 capsule   2   . escitalopram (LEXAPRO) 20 MG tablet   Oral   Take 1 tablet (20 mg total) by mouth every morning.   30 tablet   2   . fluocinonide gel (LIDEX) 0.05 %   Topical    Apply 1 application topically 3 (three) times daily. To mouth ulcers   60 g   0   . lidocaine (LIDODERM) 5 %   Transdermal   Place 1 patch onto the skin daily as needed (pain). Remove & Discard patch within 12 hours or as directed by MD WEARS PATCH ON LOWER BACK FOR BACK PAIN         . lidocaine (XYLOCAINE) 2 % solution   Mouth/Throat   Use as directed 15 mLs in the mouth or throat every 3 (three) hours as needed for mouth pain.   100 mL   3   . losartan (COZAAR) 50 MG tablet   Oral   Take 1 tablet (50 mg total) by mouth every morning.   30 tablet   2   . ondansetron (ZOFRAN ODT) 4 MG disintegrating tablet   Oral   Take 1 tablet (4 mg total) by mouth every 4 (four) hours as needed for nausea or vomiting.   20 tablet   0   . oxybutynin (DITROPAN XL) 10 MG 24 hr tablet   Oral   Take 1 tablet (10 mg total) by mouth daily.   30 tablet   2   . oxyCODONE-acetaminophen (PERCOCET) 10-325 MG per tablet   Oral   Take 1 tablet by mouth every 8 (eight) hours as needed for pain.          . pravastatin (PRAVACHOL) 40 MG tablet   Oral   Take 1 tablet (40 mg total) by mouth every evening.   30 tablet   2   . predniSONE (DELTASONE) 2.5 MG tablet   Oral  Take 3 tablets (7.5 mg total) by mouth daily with breakfast.   90 tablet   2   . propranolol (INDERAL) 10 MG tablet   Oral   Take 1 tablet (10 mg total) by mouth 3 (three) times daily.   90 tablet   2   . tetrahydrozoline 0.05 % ophthalmic solution   Both Eyes   Place 1 drop into both eyes 2 (two) times daily as needed (dry eyes).         Marland Kitchen tiotropium (SPIRIVA) 18 MCG inhalation capsule   Inhalation   Place 1 capsule (18 mcg total) into inhaler and inhale daily. USES IN THE EVENING   30 capsule   2   . hydrochlorothiazide (HYDRODIURIL) 25 MG tablet   Oral   Take 25 mg by mouth every morning.          BP 105/64  Pulse 71  Temp(Src) 100.1 F (37.8 C) (Oral)  Resp 20  Ht 5\' 7"  (1.702 m)  Wt 196 lb  (88.905 kg)  BMI 30.69 kg/m2  SpO2 96% Physical Exam  Nursing note and vitals reviewed. Constitutional: She is oriented to person, place, and time. She appears well-developed and well-nourished. No distress.  HENT:  Head: Normocephalic and atraumatic.  Eyes: EOM are normal. Pupils are equal, round, and reactive to light.  Neck: Neck supple. No JVD present. No tracheal deviation present.  Cardiovascular: Normal rate and regular rhythm.  Exam reveals no friction rub.   No murmur heard. Pulmonary/Chest: Effort normal. No respiratory distress. She has no wheezes. She has no rales.  Abdominal: Soft. She exhibits no mass. There is no tenderness.  Musculoskeletal: Normal range of motion. She exhibits no edema.  Lymphadenopathy:    She has no cervical adenopathy.  Neurological: She is alert and oriented to person, place, and time. No cranial nerve deficit. Coordination normal.  Speech is slightly dysarthric.   Skin: Skin is warm and dry. No rash noted.  Psychiatric: She has a normal mood and affect. Her behavior is normal.    ED Course  Procedures (including critical care time) Medications - No data to display  DIAGNOSTIC STUDIES: Oxygen Saturation is 96% on RA, normal by my interpretation.    COORDINATION OF CARE: 11:29 PM- Discussed treatment plan with pt. Pt agrees to plan.    Labs Review Results for orders placed during the hospital encounter of 03/31/13  CBC WITH DIFFERENTIAL      Result Value Ref Range   WBC 5.0  4.0 - 10.5 K/uL   RBC 4.13  3.87 - 5.11 MIL/uL   Hemoglobin 13.0  12.0 - 15.0 g/dL   HCT 37.1  36.0 - 46.0 %   MCV 89.8  78.0 - 100.0 fL   MCH 31.5  26.0 - 34.0 pg   MCHC 35.0  30.0 - 36.0 g/dL   RDW 13.4  11.5 - 15.5 %   Platelets 251  150 - 400 K/uL   Neutrophils Relative % 65  43 - 77 %   Neutro Abs 3.3  1.7 - 7.7 K/uL   Lymphocytes Relative 22  12 - 46 %   Lymphs Abs 1.1  0.7 - 4.0 K/uL   Monocytes Relative 8  3 - 12 %   Monocytes Absolute 0.4  0.1 -  1.0 K/uL   Eosinophils Relative 5  0 - 5 %   Eosinophils Absolute 0.3  0.0 - 0.7 K/uL   Basophils Relative 0  0 - 1 %   Basophils  Absolute 0.0  0.0 - 0.1 K/uL  COMPREHENSIVE METABOLIC PANEL      Result Value Ref Range   Sodium 122 (*) 137 - 147 mEq/L   Potassium 3.3 (*) 3.7 - 5.3 mEq/L   Chloride 81 (*) 96 - 112 mEq/L   CO2 30  19 - 32 mEq/L   Glucose, Bld 104 (*) 70 - 99 mg/dL   BUN 9  6 - 23 mg/dL   Creatinine, Ser 0.73  0.50 - 1.10 mg/dL   Calcium 8.8  8.4 - 10.5 mg/dL   Total Protein 6.8  6.0 - 8.3 g/dL   Albumin 3.3 (*) 3.5 - 5.2 g/dL   AST 16  0 - 37 U/L   ALT 18  0 - 35 U/L   Alkaline Phosphatase 67  39 - 117 U/L   Total Bilirubin 0.4  0.3 - 1.2 mg/dL   GFR calc non Af Amer >90  >90 mL/min   GFR calc Af Amer >90  >90 mL/min  URINALYSIS, ROUTINE W REFLEX MICROSCOPIC      Result Value Ref Range   Color, Urine YELLOW  YELLOW   APPearance CLEAR  CLEAR   Specific Gravity, Urine 1.010  1.005 - 1.030   pH 7.0  5.0 - 8.0   Glucose, UA NEGATIVE  NEGATIVE mg/dL   Hgb urine dipstick TRACE (*) NEGATIVE   Bilirubin Urine NEGATIVE  NEGATIVE   Ketones, ur NEGATIVE  NEGATIVE mg/dL   Protein, ur NEGATIVE  NEGATIVE mg/dL   Urobilinogen, UA 0.2  0.0 - 1.0 mg/dL   Nitrite NEGATIVE  NEGATIVE   Leukocytes, UA NEGATIVE  NEGATIVE  URINE MICROSCOPIC-ADD ON      Result Value Ref Range   Squamous Epithelial / LPF FEW (*) RARE   WBC, UA 0-2  <3 WBC/hpf   RBC / HPF 0-2  <3 RBC/hpf   Bacteria, UA RARE  RARE  SODIUM, URINE, RANDOM      Result Value Ref Range   Sodium, Ur 32    CREATININE, URINE, RANDOM      Result Value Ref Range   Creatinine, Urine 20.76    LACTIC ACID, PLASMA      Result Value Ref Range   Lactic Acid, Venous 0.7  0.5 - 2.2 mmol/L  URINE RAPID DRUG SCREEN (HOSP PERFORMED)      Result Value Ref Range   Opiates NONE DETECTED  NONE DETECTED   Cocaine NONE DETECTED  NONE DETECTED   Benzodiazepines NONE DETECTED  NONE DETECTED   Amphetamines NONE DETECTED  NONE DETECTED    Tetrahydrocannabinol NONE DETECTED  NONE DETECTED   Barbiturates NONE DETECTED  NONE DETECTED    MDM   Final diagnoses:  Hyponatremia  Hypokalemia  Weakness    Headache which seems most consistent with muscle contraction headache. Recurrent hyponatremia which could be due 2 psychogenic water drinking but also consider SIADH. She is on multiple medications which can cause hyponatremia or SIADH. Medications which could contribute to hyponatremia include duloxetine, amitriptyline, ask a towel clamp, hydrochlorothiazide, losartan, clonazepam, and oxybutynin. She's given IV normal saline and will be admitted under observation status to get additional hydration. She will need to be evaluated for possible SIADH. Case is discussed with Dr. Hal Hope of [facility] who agrees to admit the patient.  I personally performed the services described in this documentation, which was scribed in my presence. The recorded information has been reviewed and is accurate.     Tonya Fuel, MD 18/84/16 [id number]

## 2013-04-01 ENCOUNTER — Telehealth (INDEPENDENT_AMBULATORY_CARE_PROVIDER_SITE_OTHER): Payer: Self-pay

## 2013-04-01 ENCOUNTER — Inpatient Hospital Stay (HOSPITAL_COMMUNITY): Payer: Medicare Other

## 2013-04-01 ENCOUNTER — Telehealth: Payer: Self-pay | Admitting: *Deleted

## 2013-04-01 ENCOUNTER — Ambulatory Visit (HOSPITAL_COMMUNITY): Payer: Self-pay | Admitting: Psychiatry

## 2013-04-01 ENCOUNTER — Observation Stay (HOSPITAL_COMMUNITY): Payer: Medicare Other

## 2013-04-01 ENCOUNTER — Encounter (HOSPITAL_COMMUNITY): Payer: Self-pay | Admitting: Internal Medicine

## 2013-04-01 DIAGNOSIS — G9340 Encephalopathy, unspecified: Principal | ICD-10-CM

## 2013-04-01 DIAGNOSIS — E871 Hypo-osmolality and hyponatremia: Secondary | ICD-10-CM

## 2013-04-01 DIAGNOSIS — R7989 Other specified abnormal findings of blood chemistry: Secondary | ICD-10-CM

## 2013-04-01 DIAGNOSIS — R531 Weakness: Secondary | ICD-10-CM | POA: Diagnosis present

## 2013-04-01 DIAGNOSIS — R778 Other specified abnormalities of plasma proteins: Secondary | ICD-10-CM

## 2013-04-01 DIAGNOSIS — E2749 Other adrenocortical insufficiency: Secondary | ICD-10-CM

## 2013-04-01 DIAGNOSIS — R5381 Other malaise: Secondary | ICD-10-CM

## 2013-04-01 DIAGNOSIS — K120 Recurrent oral aphthae: Secondary | ICD-10-CM

## 2013-04-01 DIAGNOSIS — R5383 Other fatigue: Secondary | ICD-10-CM

## 2013-04-01 DIAGNOSIS — R799 Abnormal finding of blood chemistry, unspecified: Secondary | ICD-10-CM

## 2013-04-01 LAB — CBC WITH DIFFERENTIAL/PLATELET

## 2013-04-01 LAB — COMPREHENSIVE METABOLIC PANEL
ALBUMIN: 3 g/dL — AB (ref 3.5–5.2)
ALT: 15 U/L (ref 0–35)
AST: 17 U/L (ref 0–37)
Alkaline Phosphatase: 60 U/L (ref 39–117)
BILIRUBIN TOTAL: 0.3 mg/dL (ref 0.3–1.2)
BUN: 7 mg/dL (ref 6–23)
CO2: 33 mEq/L — ABNORMAL HIGH (ref 19–32)
CREATININE: 0.88 mg/dL (ref 0.50–1.10)
Calcium: 8.3 mg/dL — ABNORMAL LOW (ref 8.4–10.5)
Chloride: 88 mEq/L — ABNORMAL LOW (ref 96–112)
GFR calc Af Amer: 83 mL/min — ABNORMAL LOW (ref >90)
GFR calc non Af Amer: 72 mL/min — ABNORMAL LOW (ref >90)
Glucose, Bld: 98 mg/dL (ref 70–99)
Potassium: 3.1 mEq/L — ABNORMAL LOW (ref 3.7–5.3)
Sodium: 128 mEq/L — ABNORMAL LOW (ref 137–147)
Total Protein: 6 g/dL (ref 6.0–8.3)

## 2013-04-01 LAB — TROPONIN I
Troponin I: 0.43 ng/mL (ref <0.30)
Troponin I: 0.45 ng/mL (ref <0.30)
Troponin I: 0.45 ng/mL (ref <0.30)

## 2013-04-01 LAB — LACTIC ACID, PLASMA: Lactic Acid, Venous: 0.7 mmol/L (ref 0.5–2.2)

## 2013-04-01 LAB — BLOOD GAS, ARTERIAL
Acid-Base Excess: 4.6 mmol/L — ABNORMAL HIGH (ref 0.0–2.0)
Bicarbonate: 28.4 meq/L — ABNORMAL HIGH (ref 20.0–24.0)
Drawn by: 38235
FIO2: 0.21 %
O2 Saturation: 91.8 %
Patient temperature: 37
TCO2: 25.5 mmol/L (ref 0–100)
pCO2 arterial: 40.9 mmHg (ref 35.0–45.0)
pH, Arterial: 7.457 — ABNORMAL HIGH (ref 7.350–7.450)
pO2, Arterial: 62.5 mmHg — ABNORMAL LOW (ref 80.0–100.0)

## 2013-04-01 LAB — RAPID URINE DRUG SCREEN, HOSP PERFORMED
Amphetamines: NOT DETECTED
Barbiturates: NOT DETECTED
Benzodiazepines: NOT DETECTED
Cocaine: NOT DETECTED
OPIATES: NOT DETECTED
TETRAHYDROCANNABINOL: NOT DETECTED

## 2013-04-01 LAB — BASIC METABOLIC PANEL
BUN: 6 mg/dL (ref 6–23)
BUN: 7 mg/dL (ref 6–23)
CHLORIDE: 93 meq/L — AB (ref 96–112)
CHLORIDE: 93 meq/L — AB (ref 96–112)
CO2: 32 mEq/L (ref 19–32)
CO2: 30 meq/L (ref 19–32)
Calcium: 8.3 mg/dL — ABNORMAL LOW (ref 8.4–10.5)
Calcium: 8.4 mg/dL (ref 8.4–10.5)
Creatinine, Ser: 0.83 mg/dL (ref 0.50–1.10)
Creatinine, Ser: 0.81 mg/dL (ref 0.50–1.10)
GFR calc Af Amer: >90 mL/min (ref >90)
GFR calc non Af Amer: 77 mL/min — ABNORMAL LOW (ref >90)
GFR calc non Af Amer: 79 mL/min — ABNORMAL LOW (ref >90)
GFR, EST AFRICAN AMERICAN: 89 mL/min — AB (ref >90)
GLUCOSE: 106 mg/dL — AB (ref 70–99)
Glucose, Bld: 122 mg/dL — ABNORMAL HIGH (ref 70–99)
POTASSIUM: 4.1 meq/L (ref 3.7–5.3)
Potassium: 4.6 mEq/L (ref 3.7–5.3)
Sodium: 132 mEq/L — ABNORMAL LOW (ref 137–147)
Sodium: 132 mEq/L — ABNORMAL LOW (ref 137–147)

## 2013-04-01 LAB — CREATININE, URINE, RANDOM: Creatinine, Urine: 20.76 mg/dL

## 2013-04-01 LAB — SODIUM, URINE, RANDOM: Sodium, Ur: 32 meq/L

## 2013-04-01 LAB — OSMOLALITY: Osmolality: 251 mosm/kg — ABNORMAL LOW (ref 275–300)

## 2013-04-01 LAB — URIC ACID: Uric Acid, Serum: 3.2 mg/dL (ref 2.4–7.0)

## 2013-04-01 LAB — MAGNESIUM: Magnesium: 2.3 mg/dL (ref 1.5–2.5)

## 2013-04-01 LAB — AMMONIA: AMMONIA: 15 umol/L (ref 11–60)

## 2013-04-01 MED ORDER — SENNOSIDES-DOCUSATE SODIUM 8.6-50 MG PO TABS: 1.0000 | ORAL_TABLET | Freq: Every evening | ORAL | Status: DC | PRN

## 2013-04-01 MED ORDER — SODIUM CHLORIDE 0.9 % IV SOLN
INTRAVENOUS | Status: DC
  Administered 2013-04-01 – 2013-04-02 (×2): via INTRAVENOUS

## 2013-04-01 MED ORDER — SODIUM CHLORIDE 0.9 % IV SOLN
INTRAVENOUS | Status: DC
  Administered 2013-04-01: 08:00:00 via INTRAVENOUS

## 2013-04-01 MED ORDER — POTASSIUM CHLORIDE 10 MEQ/100ML IV SOLN
10.0000 meq | INTRAVENOUS | Status: AC
  Administered 2013-04-01 (×3): 10 meq via INTRAVENOUS
  Filled 2013-04-01 (×3): qty 100

## 2013-04-01 MED ORDER — SIMVASTATIN 20 MG PO TABS
20.0000 mg | ORAL_TABLET | Freq: Every day | ORAL | Status: DC
  Administered 2013-04-01: 20 mg via ORAL
  Filled 2013-04-01: qty 1

## 2013-04-01 MED ORDER — CLONAZEPAM 0.5 MG PO TABS
1.0000 mg | ORAL_TABLET | Freq: Two times a day (BID) | ORAL | Status: DC | PRN
  Administered 2013-04-01 – 2013-04-02 (×2): 1 mg via ORAL
  Filled 2013-04-01 (×2): qty 2

## 2013-04-01 MED ORDER — ASPIRIN 300 MG RE SUPP
300.0000 mg | Freq: Once | RECTAL | Status: AC
  Administered 2013-04-01: 300 mg via RECTAL
  Filled 2013-04-01 (×2): qty 1

## 2013-04-01 MED ORDER — ONDANSETRON HCL 4 MG PO TABS
4.0000 mg | ORAL_TABLET | Freq: Four times a day (QID) | ORAL | Status: DC | PRN
  Administered 2013-04-01: 4 mg via ORAL
  Filled 2013-04-01: qty 1

## 2013-04-01 MED ORDER — ONDANSETRON HCL 4 MG/2ML IJ SOLN: 4.0000 mg | Freq: Three times a day (TID) | INTRAMUSCULAR | Status: DC | PRN

## 2013-04-01 MED ORDER — ALBUTEROL SULFATE (2.5 MG/3ML) 0.083% IN NEBU
2.5000 mg | INHALATION_SOLUTION | RESPIRATORY_TRACT | Status: DC | PRN
Start: 2013-04-01 — End: 2013-04-02

## 2013-04-01 MED ORDER — ASPIRIN 300 MG RE SUPP
300.0000 mg | Freq: Every day | RECTAL | Status: DC
  Filled 2013-04-01 (×3): qty 1

## 2013-04-01 MED ORDER — ASPIRIN 81 MG PO CHEW
324.0000 mg | CHEWABLE_TABLET | Freq: Once | ORAL | Status: DC
  Filled 2013-04-01: qty 4

## 2013-04-01 MED ORDER — SODIUM CHLORIDE 0.9 % IV SOLN: INTRAVENOUS | Status: DC

## 2013-04-01 MED ORDER — ASPIRIN 325 MG PO TABS
325.0000 mg | ORAL_TABLET | Freq: Every day | ORAL | Status: DC
  Administered 2013-04-02: 325 mg via ORAL
  Filled 2013-04-01: qty 1

## 2013-04-01 MED ORDER — ENOXAPARIN SODIUM 40 MG/0.4ML ~~LOC~~ SOLN
40.0000 mg | SUBCUTANEOUS | Status: DC
  Administered 2013-04-01 – 2013-04-02 (×2): 40 mg via SUBCUTANEOUS
  Filled 2013-04-01 (×2): qty 0.4

## 2013-04-01 MED ORDER — HYDROCORTISONE NA SUCCINATE PF 100 MG IJ SOLR
50.0000 mg | Freq: Three times a day (TID) | INTRAMUSCULAR | Status: DC
  Administered 2013-04-01 – 2013-04-02 (×5): 50 mg via INTRAVENOUS
  Filled 2013-04-01 (×5): qty 2

## 2013-04-01 MED ORDER — POTASSIUM CHLORIDE CRYS ER 20 MEQ PO TBCR
40.0000 meq | EXTENDED_RELEASE_TABLET | Freq: Once | ORAL | Status: AC
  Administered 2013-04-01: 40 meq via ORAL
  Filled 2013-04-01: qty 2

## 2013-04-01 MED ORDER — OXYCODONE-ACETAMINOPHEN 5-325 MG PO TABS
1.0000 | ORAL_TABLET | ORAL | Status: DC | PRN
  Administered 2013-04-01 – 2013-04-02 (×4): 1 via ORAL
  Filled 2013-04-01 (×4): qty 1

## 2013-04-01 MED ORDER — TIOTROPIUM BROMIDE MONOHYDRATE 18 MCG IN CAPS
18.0000 ug | ORAL_CAPSULE | Freq: Every day | RESPIRATORY_TRACT | Status: DC
  Administered 2013-04-02: 18 ug via RESPIRATORY_TRACT
  Filled 2013-04-01: qty 5

## 2013-04-01 NOTE — ED Notes (Signed)
Up to bathroom easily ambulatory

## 2013-04-01 NOTE — Progress Notes (Signed)
Pt. Awake enough to take and pass swallow screen but was lethargic and unable to stay awake when attempting to give her Aspirin.  Had order changed to suppository.  Until patient becomes more alert, will not give her anything PO.  Will continue to monitor patient.

## 2013-04-01 NOTE — Telephone Encounter (Signed)
Received call from patient.   Reports that she is currently at hospital and she has been admitted for hyponatremia and hypokalemia.   States that she was extremely weak and called in to nurse line last night where she was advised to go to ER.   She wants to speak with MD.

## 2013-04-01 NOTE — Telephone Encounter (Signed)
Pt called wanting Dr Rosendo Gros to know she has been admitted to [facility]. She wants him to review her records and labs in epic. Pt requests Dr Rosendo Gros give her a call at her room [phone]. I advised pt I will send this msg to the MD and his assistant.

## 2013-04-01 NOTE — H&P (Addendum)
[facility] History and Physical  Tonya Sullivan VOH:[id number] DOB: [birth year] DOA: 03/31/2013  Referring physician: ER physician. PCP: Vic Blackbird, MD   Chief Complaint: Weakness.  HPI: Tonya Sullivan is a 58 y.o. female who was just recently discharged after being treated for hyponatremia presented to the ER because of weakness. Patient states he called the EMS because she was feeling weak since discharge. In the ER patient's blood pressure was in the low 69S systolic. Patient's sodium was found to be around 122 and during last admission few days ago patient's sodium had gone down to 117 and improved to 132 at time of discharge. Patient has had recent surgery for achalasia. Since then patient's intake has been poor. On my exam patient looks drowsy and has difficulty bringing out words with dysarthria. Talking to the ER physician ED physician states that patient has had difficulty speaking since she came. Patient states she's been having difficulty speaking for last few days. Patient looks drowsy and easily falls asleep. Patient denies any chest pain headache falls shortness of breath abdominal pain nausea vomiting diarrhea. Patient's history has to be retaken once patient is more alert and awake.   Review of Systems: As presented in the history of presenting illness, rest negative.  Past Medical History  Diagnosis Date  . COPD (chronic obstructive pulmonary disease)   . Depression   . Chronic back pain     DDD, disc bulge, radiculopathy, spinal stenosis  . Hyperlipidemia   . Anxiety   . Bipolar disorder, unspecified   . Carotid artery calcification   . DDD (degenerative disc disease), lumbosacral   . Spinal stenosis, lumbar   . Thyroid disease   . Autoimmune thyroiditis   . Hypertension   . TIA (transient ischemic attack)     august 2014  . GERD (gastroesophageal reflux disease)   . H/O dizziness   . Neuropathic pain   . Adrenal gland dysfunction     Addison's disease  ON DAILY PREDNISONE  . Addison's disease   . Stroke     HX OF MINI STROKE AUG 2014- DROPPIN EVERYTHING, SPEECH SLURRED, MEMORY AFFECTED --ALL RESOLVED - NO DEFICITS NOW  . Pneumonia     LAST PNEUMONIA WAS AUG 2014  . Achalasia   . Mouth ulcers   . Dysrhythmia     palpitations  . Melanoma     removed 1 week ago at Dr Teryl Lucy office   Past Surgical History  Procedure Laterality Date  . Appendectomy    . Cholecystectomy    . Lump left breast      removed- benign  . Abdominal hysterectomy      tubal pregnancy  . Inguinal hernia repair    . Stomach surgery      ?holes in esophgous  . Ectopic pregnancy surgery    . Back surgery      x 5;1984;1989;;1999;2000;2010  . Colonoscopy  01/25/2012    Procedure: COLONOSCOPY;  Surgeon: Rogene Houston, MD;  Location: [facility];  Service: Endoscopy;  Laterality: N/A;  130  . Esophagogastroduodenoscopy (egd) with esophageal dilation N/A 08/13/2012    Procedure: ESOPHAGOGASTRODUODENOSCOPY (EGD) WITH ESOPHAGEAL DILATION;  Surgeon: Rogene Houston, MD;  Location: [facility];  Service: Endoscopy;  Laterality: N/A;  315  . Breast surgery      left breast  . Anterior cervical decomp/discectomy fusion N/A 10/21/2012    Procedure: Cervical Six-Seven Anterior cervical decompression/diskectomy/fusion;  Surgeon: Kristeen Miss, MD;  Location: [facility];  Service: Neurosurgery;  Laterality: N/A;  Cervical Six-Seven Anterior cervical decompression/diskectomy/fusion  . Cataract extraction w/phaco Right 11/25/2012    Procedure: CATARACT EXTRACTION PHACO AND INTRAOCULAR LENS PLACEMENT (IOC);  Surgeon: Elta Guadeloupe T. Gershon Crane, MD;  Location: [facility];  Service: Ophthalmology;  Laterality: Right;  CDE:10.06  . Cataract extraction w/phaco Left 12/09/2012    Procedure: CATARACT EXTRACTION PHACO AND INTRAOCULAR LENS PLACEMENT (IOC);  Surgeon: Elta Guadeloupe T. Gershon Crane, MD;  Location: [facility];  Service: Ophthalmology;  Laterality: Left;  CDE:5.06  . Tonsillectomy    . Hiatal hernia  repair    . Heller myotomy N/A 03/17/2013    Procedure: DIAGNOSTIC LAPAROSCOPY, LAPAROSCOPIC HELLER MYOTOMY, ENDOSCOPY, DOR FUNDOPLICATION;  Surgeon: Ralene Ok, MD;  Location: [facility];  Service: General;  Laterality: N/A;   Social History:  reports that she has been smoking Cigarettes.  She has a 10 pack-year smoking history. She has never used smokeless tobacco. She reports that she does not drink alcohol or use illicit drugs. Where does patient live home. Can patient participate in ADLs? Yes.  Allergies  Allergen Reactions  . Codeine Itching  . Cyclobenzaprine Hives  . Darvocet [Propoxyphene N-Acetaminophen] Rash  . Abilify [Aripiprazole] Other (See Comments)    tremors  . Ace Inhibitors   . Adhesive [Tape] Hives  . Gabapentin Swelling  . Iodine Hives  . Levofloxacin Hives and Hypertension  . Sulfa Antibiotics Itching  . Robaxin [Methocarbamol] Rash    Family History:  Family History  Problem Relation Age of Onset  . Hypertension Mother   . Heart disease Mother   . Heart attack Mother   . Anxiety disorder Mother   . Cancer Father     lung cancer  . Heart attack Father   . Alcohol abuse Father   . Hypertension Brother   . Alcohol abuse Brother   . Bipolar disorder Brother   . ADD / ADHD Brother   . Drug abuse Brother   . OCD Brother   . Alcohol abuse Sister   . Bipolar disorder Sister   . Anxiety disorder Sister   . ADD / ADHD Sister   . Drug abuse Sister   . Physical abuse Sister   . Sexual abuse Sister   . Alcohol abuse Brother   . Bipolar disorder Brother   . ADD / ADHD Brother   . Drug abuse Brother   . Alcohol abuse Sister   . Bipolar disorder Sister   . Anxiety disorder Sister   . ADD / ADHD Sister   . Physical abuse Sister   . Sexual abuse Sister   . Alcohol abuse Brother   . Bipolar disorder Brother   . ADD / ADHD Brother   . Bipolar disorder Brother   . ADD / ADHD Brother   . Alcohol abuse Brother   . Paranoid behavior Brother   .  Physical abuse Brother   . Sexual abuse Brother   . Bipolar disorder Maternal Aunt   . Bipolar disorder Paternal Aunt   . Bipolar disorder Maternal Uncle   . Bipolar disorder Paternal Uncle   . Bipolar disorder Maternal Grandfather   . Alcohol abuse Maternal Grandfather   . Bipolar disorder Maternal Grandmother   . Alcohol abuse Maternal Grandmother   . Anxiety disorder Maternal Grandmother   . Dementia Maternal Grandmother   . Bipolar disorder Paternal Grandfather   . Alcohol abuse Paternal Grandfather   . Bipolar disorder Paternal Grandmother   . Alcohol abuse Paternal Grandmother   . Drug  abuse Paternal Grandmother   . Bipolar disorder Maternal Uncle   . Schizophrenia Neg Hx   . Seizures Neg Hx       Prior to Admission medications   Medication Sig Start Date End Date Taking? Authorizing Provider  albuterol (PROAIR HFA) 108 (90 BASE) MCG/ACT inhaler Inhale 2 puffs into the lungs every 6 (six) hours as needed for wheezing or shortness of breath. 02/27/13  Yes Alycia Rossetti, MD  albuterol (PROVENTIL) (2.5 MG/3ML) 0.083% nebulizer solution Take 3 mLs (2.5 mg total) by nebulization every 4 (four) hours as needed for wheezing. 03/16/13  Yes Alycia Rossetti, MD  amitriptyline (ELAVIL) 100 MG tablet Take 1 tablet (100 mg total) by mouth at bedtime. 02/12/13  Yes Levonne Spiller, MD  anti-nausea (EMETROL) solution Take 10 mLs by mouth every 15 (fifteen) minutes as needed for nausea or vomiting.   Yes Historical Provider, MD  baclofen (LIORESAL) 10 MG tablet Take 1 tablet (10 mg total) by mouth 3 (three) times daily. 03/29/13  Yes Kathie Dike, MD  clonazePAM (KLONOPIN) 1 MG tablet Take 1 tablet (1 mg total) by mouth 4 (four) times daily. 02/12/13 02/12/14 Yes Levonne Spiller, MD  DULoxetine (CYMBALTA) 60 MG capsule Take 1 capsule (60 mg total) by mouth every morning. 02/12/13  Yes Levonne Spiller, MD  escitalopram (LEXAPRO) 20 MG tablet Take 1 tablet (20 mg total) by mouth every morning. 02/12/13  02/12/14 Yes Levonne Spiller, MD  fluocinonide gel (LIDEX) 0.93 % Apply 1 application topically 3 (three) times daily. To mouth ulcers 03/11/13  Yes Alycia Rossetti, MD  lidocaine (LIDODERM) 5 % Place 1 patch onto the skin daily as needed (pain). Remove & Discard patch within 12 hours or as directed by MD WEARS PATCH ON LOWER BACK FOR BACK PAIN 05/22/12 05/22/13 Yes Darrol Jump, MD  lidocaine (XYLOCAINE) 2 % solution Use as directed 15 mLs in the mouth or throat every 3 (three) hours as needed for mouth pain. 03/11/13  Yes Alycia Rossetti, MD  losartan (COZAAR) 50 MG tablet Take 1 tablet (50 mg total) by mouth every morning. 02/27/13  Yes Alycia Rossetti, MD  ondansetron (ZOFRAN ODT) 4 MG disintegrating tablet Take 1 tablet (4 mg total) by mouth every 4 (four) hours as needed for nausea or vomiting. 03/19/13  Yes Ralene Ok, MD  oxybutynin (DITROPAN XL) 10 MG 24 hr tablet Take 1 tablet (10 mg total) by mouth daily. 02/27/13  Yes Alycia Rossetti, MD  oxyCODONE-acetaminophen (PERCOCET) 10-325 MG per tablet Take 1 tablet by mouth every 8 (eight) hours as needed for pain.    Yes Historical Provider, MD  pravastatin (PRAVACHOL) 40 MG tablet Take 1 tablet (40 mg total) by mouth every evening. 02/27/13  Yes Alycia Rossetti, MD  predniSONE (DELTASONE) 2.5 MG tablet Take 3 tablets (7.5 mg total) by mouth daily with breakfast. 02/27/13  Yes Alycia Rossetti, MD  propranolol (INDERAL) 10 MG tablet Take 1 tablet (10 mg total) by mouth 3 (three) times daily. 03/19/13  Yes Levonne Spiller, MD  tetrahydrozoline 0.05 % ophthalmic solution Place 1 drop into both eyes 2 (two) times daily as needed (dry eyes).   Yes Historical Provider, MD  tiotropium (SPIRIVA) 18 MCG inhalation capsule Place 1 capsule (18 mcg total) into inhaler and inhale daily. USES IN THE EVENING 02/27/13  Yes Alycia Rossetti, MD  hydrochlorothiazide (HYDRODIURIL) 25 MG tablet Take 25 mg by mouth every morning. 03/02/13   Historical Provider, MD  Physical Exam: Filed Vitals:   03/31/13 2144 04/01/13 0109 04/01/13 0150  BP: 105/64 148/79 98/71  Pulse: 71 72 63  Temp: 100.1 F (37.8 C)  98.7 F (37.1 C)  TempSrc: Oral    Resp: 20 20 20   Height: 5\' 7"  (1.702 m)  5\' 7"  (1.702 m)  Weight: 88.905 kg (196 lb)  90.12 kg (198 lb 10.9 oz)  SpO2: 96% 100% 99%     General:  Well-developed and nourished.  Eyes: Anicteric no pallor.  ENT: No discharge from ears eyes nose mouth.  Neck: No mass felt.  Cardiovascular: S1-S2 heard.  Respiratory: No rhonchi or crepitations.  Abdomen: Soft nontender bowel sounds present.  Skin: No rash. Bruise and ecchymosis from recent shots.  Musculoskeletal: No edema.  Psychiatric: Patient is drowsy but answers questions appropriately.  Neurologic: Patient is drowsy but answers questions appropriately. Moves all extremities 5 x 5. No facial asymmetry. Tongue is midline.  Labs on Admission:  Basic Metabolic Panel:  Recent Labs Lab 03/27/13 1318 03/28/13 0432 03/29/13 0544 03/31/13 2220  NA 117* 130* 132* 122*  K 3.2* 3.2* 3.9 3.3*  CL 72* 89* 93* 81*  CO2 33* 33* 32 30  GLUCOSE 116* 109* 94 104*  BUN 7 6 5* 9  CREATININE 0.74 0.72 0.76 0.73  CALCIUM 9.1 8.4 8.7 8.8   Liver Function Tests:  Recent Labs Lab 03/28/13 0432 03/31/13 2220  AST 16 16  ALT 15 18  ALKPHOS 65 67  BILITOT 0.3 0.4  PROT 6.4 6.8  ALBUMIN 3.1* 3.3*   No results found for this basename: LIPASE, AMYLASE,  in the last 168 hours No results found for this basename: AMMONIA,  in the last 168 hours CBC:  Recent Labs Lab 03/27/13 1318 03/28/13 0432 03/31/13 2220  WBC 8.2 6.1 5.0  NEUTROABS  --   --  3.3  HGB 13.5 12.2 13.0  HCT 37.3 34.7* 37.1  MCV 88.0 90.1 89.8  PLT 306 299 251   Cardiac Enzymes: No results found for this basename: CKTOTAL, CKMB, CKMBINDEX, TROPONINI,  in the last 168 hours  BNP (last 3 results)  Recent Labs  08/03/12 0850 10/25/12 0930  PROBNP 745.1* 209.0*    CBG: No results found for this basename: GLUCAP,  in the last 168 hours  Radiological Exams on Admission: No results found.   Assessment/Plan Principal Problem:   Acute encephalopathy Active Problems:   Adrenal cortical insufficiency   Hyponatremia   Weakness   1. Acute encephalopathy - patient has altered mental status with slurred speech differentials include medication induced, possible stroke. At this time I have discussed with on-call neurologist Dr. Doy Mince. Since patient is generally weak and not focal stroke is less likely but for now I have ordered CT head and also followup with an MRI brain and until then patient will be on neurochecks. Check ABG ammonia levels and urine drug screen. Hold patient's pain relief medications and antipsychotic medications for now. Closely observe. 2. Weakness with hyponatremia and history of Addison's disease - I have ordered stress dose of steroids. Hyponatremia probably from dehydration and patient states she still takes hydrochlorothiazide. Patient urine sodium probably is affected by patient taking hydrochlorothiazide. Patient's sodium improved with hydration during last admission. At this time I will continue to hydrate and closely follow metabolic panel. Patient's TSH was normal last admission last week. 3. Hypertension - hold antihypertensives for now as patient's blood pressure is low normal. Continue with hydration. Stress dose steroids. 4. COPD -  presently not wheezing. 5. Hyperlipidemia - continue statins when patient can take orally. 6. Mild hypokalemia probably from his EKG and dehydration - replace and recheck. Check magnesium. 7. Chronic pain and depression - since patient is drowsy medications are on hold.  I have discussed with on-call neurologist Dr. Doy Mince. EKG and cardiac markers along with ABG urine drug  screen and ammonia are pending.  Code Status: Full code.  Family Communication: Patient's son.  Disposition Plan:  Admit to inpatient.    Kalila Adkison N. [facility] Pager [phone].  If 7PM-7AM, please contact night-coverage [email or web address] Password [email or web address] 04/01/2013, 2:51 AM

## 2013-04-01 NOTE — Evaluation (Signed)
Clinical/Bedside Swallow Evaluation Patient Details  Name: Tonya Sullivan MRN: [id number] Date of Birth: 06-Sep-1955  Today's Date: 04/01/2013 Time: 0100-0115 SLP Time Calculation (min): 15 min  Past Medical History:  Past Medical History  Diagnosis Date  . COPD (chronic obstructive pulmonary disease)   . Depression   . Chronic back pain     DDD, disc bulge, radiculopathy, spinal stenosis  . Hyperlipidemia   . Anxiety   . Bipolar disorder, unspecified   . Carotid artery calcification   . DDD (degenerative disc disease), lumbosacral   . Spinal stenosis, lumbar   . Thyroid disease   . Autoimmune thyroiditis   . Hypertension   . TIA (transient ischemic attack)     august 2014  . GERD (gastroesophageal reflux disease)   . H/O dizziness   . Neuropathic pain   . Adrenal gland dysfunction     Addison's disease ON DAILY PREDNISONE  . Addison's disease   . Stroke     HX OF MINI STROKE AUG 2014- DROPPIN EVERYTHING, SPEECH SLURRED, MEMORY AFFECTED --ALL RESOLVED - NO DEFICITS NOW  . Pneumonia     LAST PNEUMONIA WAS AUG 2014  . Achalasia   . Mouth ulcers   . Dysrhythmia     palpitations  . Melanoma     removed 1 week ago at Dr Teryl Lucy office   Past Surgical History:  Past Surgical History  Procedure Laterality Date  . Appendectomy    . Cholecystectomy    . Lump left breast      removed- benign  . Abdominal hysterectomy      tubal pregnancy  . Inguinal hernia repair    . Stomach surgery      ?holes in esophgous  . Ectopic pregnancy surgery    . Back surgery      x 5;1984;1989;;1999;2000;2010  . Colonoscopy  01/25/2012    Procedure: COLONOSCOPY;  Surgeon: Rogene Houston, MD;  [facility];  Service: Endoscopy;  Laterality: N/A;  130  . Esophagogastroduodenoscopy (egd) with esophageal dilation N/A 08/13/2012    Procedure: ESOPHAGOGASTRODUODENOSCOPY (EGD) WITH ESOPHAGEAL DILATION;  Surgeon: Rogene Houston, MD;  [facility];  Service: Endoscopy;   Laterality: N/A;  315  . Breast surgery      left breast  . Anterior cervical decomp/discectomy fusion N/A 10/21/2012    Procedure: Cervical Six-Seven Anterior cervical decompression/diskectomy/fusion;  Surgeon: Kristeen Miss, MD;  Location: [facility];  Service: Neurosurgery;  Laterality: N/A;  Cervical Six-Seven Anterior cervical decompression/diskectomy/fusion  . Cataract extraction w/phaco Right 11/25/2012    Procedure: CATARACT EXTRACTION PHACO AND INTRAOCULAR LENS PLACEMENT (IOC);  Surgeon: Elta Guadeloupe T. Gershon Crane, MD;  Location: [facility];  Service: Ophthalmology;  Laterality: Right;  CDE:10.06  . Cataract extraction w/phaco Left 12/09/2012    Procedure: CATARACT EXTRACTION PHACO AND INTRAOCULAR LENS PLACEMENT (IOC);  Surgeon: Elta Guadeloupe T. Gershon Crane, MD;  Location: [facility];  Service: Ophthalmology;  Laterality: Left;  CDE:5.06  . Tonsillectomy    . Hiatal hernia repair    . Heller myotomy N/A 03/17/2013    Procedure: DIAGNOSTIC LAPAROSCOPY, LAPAROSCOPIC HELLER MYOTOMY, ENDOSCOPY, DOR FUNDOPLICATION;  Surgeon: Ralene Ok, MD;  Location: [facility];  Service: General;  Laterality: N/A;   HPI:  Tonya Sullivan is a 58 y.o. female who was just recently discharged after being treated for hyponatremia presented to the ER because of weakness. Patient states he called the EMS because she was feeling weak since discharge. In the ER patient's blood pressure was in  the low 28Z systolic. Patient's sodium was found to be around 122 and during last admission few days ago patient's sodium had gone down to 117 and improved to 132 at time of discharge. Patient has had recent surgery for achalasia. Since then patient's intake has been poor. On my exam patient looks drowsy and has difficulty bringing out words with dysarthria. Talking to the ER physician ED physician states that patient has had difficulty speaking since she came. Patient states she's been having difficulty speaking for last few days. Patient looks drowsy and easily  falls asleep. Patient denies any chest pain headache falls shortness of breath abdominal pain nausea vomiting diarrhea. Patient's history has to be retaken once patient is more alert and awake.    Assessment / Plan / Recommendation Clinical Impression  Pt was seen at bedside today to address SLP order. Nursing and pt reported that she initially presented with slurred speech that has since cleared. Pt demonstrated adequate orientation and alertness and intelligibility was at 100%. Pt reported that she had a full liquid diet following an esophageal procedure on 3/17 and has not consumed solid food since. SLP recommends continuation of full liquid diet until further recommendation of advanced textures from GI.     Aspiration Risk       Diet Recommendation  (Full Liquid diet. )   Liquid Administration via: Straw;Cup Medication Administration: Crushed with puree Supervision: Patient able to self feed Compensations: Slow rate;Small sips/bites;Follow solids with liquid Postural Changes and/or Swallow Maneuvers: Seated upright 90 degrees;Upright 30-60 min after meal    Other  Recommendations Recommended Consults:  (GI to follow d/t nature of difficulty outside of SLP scope. ) Oral Care Recommendations: Oral care BID          Swallow Study Prior Functional Status   Pt was mostly independently with ADLs. She reported that since March 17, 2012, she has been consuming only liquids and purees.     General Date of Onset: 03/17/13 HPI: Tonya Sullivan is a 58 y.o. female who was just recently discharged after being treated for hyponatremia presented to the ER because of weakness. Patient states he called the EMS because she was feeling weak since discharge. In the ER patient's blood pressure was in the low 66Q systolic. Patient's sodium was found to be around 122 and during last admission few days ago patient's sodium had gone down to 117 and improved to 132 at time of discharge. Patient has had recent  surgery for achalasia. Since then patient's intake has been poor. On my exam patient looks drowsy and has difficulty bringing out words with dysarthria. Talking to the ER physician ED physician states that patient has had difficulty speaking since she came. Patient states she's been having difficulty speaking for last few days. Patient looks drowsy and easily falls asleep. Patient denies any chest pain headache falls shortness of breath abdominal pain nausea vomiting diarrhea. Patient's history has to be retaken once patient is more alert and awake.  Type of Study: Bedside swallow evaluation Diet Prior to this Study: NPO Behavior/Cognition: Alert;Cooperative Oral Cavity - Dentition: Dentures, bottom;Dentures, top Self-Feeding Abilities: Able to feed self Patient Positioning: Upright in bed Baseline Vocal Quality: Clear    Oral/Motor/Sensory Function Overall Oral Motor/Sensory Function: Appears within functional limits for tasks assessed                      Akeela Busk S 04/01/2013,2:12 PM

## 2013-04-01 NOTE — Progress Notes (Signed)
CRITICAL VALUE ALERT  Critical value received:  Troponin  Date of notification:  04/01/2013  Time of notification:  04:03  Critical value read back:yes  Nurse who received alert:  Jovita Kussmaul  MD notified (1st page):  Hal Hope  Time of first page:  04:05  MD notified (2nd page):  Time of second page:  Responding MD:  Hal Hope  Time MD responded:  04:05

## 2013-04-01 NOTE — Care Management Note (Addendum)
    Page 1 of 1   04/02/2013     11:44:22 AM   CARE MANAGEMENT NOTE 04/02/2013  Patient:  HELMI, HECHAVARRIA   Account Number:  [id number]  Date Initiated:  04/01/2013  Documentation initiated by:  Theophilus Kinds  Subjective/Objective Assessment:   Pt admitted from home with acute encephlopathy and hyponatremia. Pt lives alone and has a PCS aide 2 hours a day, M-F and 1 hr a day on Sat and Sun. Pt stated that she is fairly independent with ADL's and still drives.     Action/Plan:   No CM needs noted.   Anticipated DC Date:  04/03/2013   Anticipated DC Plan:  [place]  CM consult      Choice offered to / List presented to:             Status of service:  Completed, signed off Medicare Important Message given?  NA - LOS <3 / Initial given by admissions (If response is "NO", the following Medicare IM given date fields will be blank) Date Medicare IM given:   Date Additional Medicare IM given:    Discharge Disposition:  HOME/SELF CARE  Per UR Regulation:    If discussed at Long Length of Stay Meetings, dates discussed:    Comments:  04/02/13 [place], RN BSN CM Pt discharged home today. No CM needs noted.  04/01/13 [place], RN BSN CM

## 2013-04-01 NOTE — Progress Notes (Signed)
Patient ID: Tonya Sullivan, female   DOB: [birth year], 58 y.o.   MRN: [id number]  [facility] PROGRESS NOTE  Tonya Sullivan TGG:[id number] DOB: [birth year] DOA: 03/31/2013 PCP: Vic Blackbird, MD  Brief narrative: 58 y.o. female who was just recently discharged after being treated for hyponatremia presented to the ER with main concern of progressively worsening weakness since the discharge. Apon arrival to the ED, pt's systolic BP noted to be in 90's and Na = 122 (was 132 at the time of the discharge 03/29/2013). Pt was rather lethargic upon arrival to the ED and was not able to provide much history.   Principal Problem:   Acute encephalopathy - unclear etiology and possibly multifactorial secondary to hypotension, dehydration, poor oral intake, hyponatremia, ? 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  Mild parenchymal brain volume loss, advanced for age though stable from prior examination.   Dg Chest Port 1 View  04/01/2013  Degraded examination with similar findings of bibasilar atelectasis/ scar.   Antibiotics:  None  Code Status: Full Family Communication: No family at bedside  Disposition Plan: Remains inpatient   HPI/Subjective: No events overnight.   Objective: Filed Vitals:   04/01/13 0109 04/01/13 0150 04/01/13 0421 04/01/13 0805  BP: 148/79 98/71 118/62 105/67  Pulse: 72 63 58 65  Temp:  98.7 F (37.1 C) 97.6 F (36.4 C) 97.8 F (36.6 C)  TempSrc:   Oral Oral  Resp: 20 20 11 14   Height:  5\' 7"  (1.702 m)    Weight:  90.12 kg (198 lb 10.9 oz)    SpO2: 100% 99% 100% 98%   No intake or output data in the 24 hours ending 04/01/13 1211  Exam:   General:  Pt is still rather somnolent but easy to arouse and follows commands appropriately when awake   Cardiovascular: Regular rate and rhythm, S1/S2, no murmurs, no rubs, no gallops  Respiratory: Diminished breath sounds bilaterally  Abdomen: Soft, non tender, non distended, bowel sounds present, no guarding  Extremities: No edema, pulses DP and PT palpable bilaterally  Data Reviewed: Basic Metabolic Panel:  Recent Labs Lab 03/29/13 0544 03/31/13 2220 04/01/13 0317 04/01/13 0711 04/01/13 1030  NA 132* 122* 128* 132* 132*  K 3.9 3.3* 3.1* 4.1 4.6  CL 93* 81* 88* 93* 93*  CO2 32 30 33* 32 30  GLUCOSE 94 104* 98 106* 122*  BUN 5* 9 7 6 7   CREATININE 0.76 0.73 0.88 0.83 0.81  CALCIUM 8.7 8.8 8.3* 8.3* 8.4  MG  --   --  2.3  --   --    Liver Function Tests:  Recent Labs Lab 03/28/13 0432 03/31/13 2220 04/01/13 0317  AST 16 16 17   ALT  15 18 15   ALKPHOS 65 67 60  BILITOT 0.3 0.4 0.3  PROT 6.4 6.8 6.0  ALBUMIN 3.1* 3.3* 3.0*    Recent Labs Lab 04/01/13 0317  AMMONIA 15   CBC:  Recent Labs Lab 03/27/13 1318 03/28/13 0432 03/31/13 2220 04/01/13 0317  WBC 8.2 6.1 5.0 DUPLICATE  NEUTROABS  --   --  3.3 PENDING  HGB 98.9 21.1 94.1 DUPLICATE  HCT 74.0 81.4* 48.1 DUPLICATE  MCV 85.6 31.4 97.0 DUPLICATE  PLT 263 785 885 DUPLICATE   Cardiac Enzymes:  Recent Labs Lab 04/01/13 0317 04/01/13 1009  TROPONINI 0.43* 0.45*   Recent Results (from the past 240 hour(s))  KOH PREP     Status: None   Collection Time    03/27/13  6:15 PM      Result Value Ref Range Status   Specimen  Description THROAT   Final   Special Requests NONE   Final   KOH Prep FEW YEAST   Final   Report Status 03/27/2013 FINAL   Final     Scheduled Meds: . aspirin  324 mg Oral Once  . aspirin  300 mg Rectal Daily   Or  . aspirin  325 mg Oral Daily  . enoxaparin (LOVENOX) injection  40 mg Subcutaneous Q24H  . hydrocortisone sod succinate (SOLU-CORTEF) inj  50 mg Intravenous Q8H  . simvastatin  20 mg Oral q1800  . tiotropium  18 mcg Inhalation Daily   Continuous Infusions: . sodium chloride 125 mL/hr at 04/01/13 0746   Faye Ramsay, MD  [facility] Pager [phone]  If 7PM-7AM, please contact night-coverage [email or web address] Password [email or web address] 04/01/2013, 12:11 PM   LOS: 1 day

## 2013-04-01 NOTE — Evaluation (Signed)
Physical Therapy Evaluation Patient Details Name: Tonya Sullivan MRN: [id number] DOB: 1955/02/13 Today's Date: 04/01/2013   History of Present Illness  Tonya Sullivan is a 58 y.o. female who was just recently discharged after being treated for hyponatremia presented to the ER because of weakness. Patient states he called the EMS because she was feeling weak since discharge. In the ER patient's blood pressure was in the low 24Q systolic. Patient's sodium was found to be around 122 and during last admission few days ago patient's sodium had gone down to 117 and improved to 132 at time of discharge. Patient has had recent surgery for achalasia. Since then patient's intake has been poor.  Clinical Impression  Pt is a 58yo female who has an aide two to three hrs. A day five days a week.  Pt normally ambulates with a cane but is safer with a rolling walker at this time.  Pt will need continued therapy to improve her activity tolerance and safety with ambulation.     Follow Up Recommendations Home health PT    Equipment Recommendations  Rolling walker with 5" wheels    Recommendations for Other Services   OT    Precautions / Restrictions Precautions Precautions: Fall Restrictions Weight Bearing Restrictions: No      Mobility  Bed Mobility Overal bed mobility: Modified Independent                Transfers Overall transfer level: Modified independent Equipment used: Rolling walker (2 wheeled)                Ambulation/Gait Ambulation/Gait assistance: Modified independent (Device/Increase time) Ambulation Distance (Feet): 50 Feet Assistive device: Rolling walker (2 wheeled)     Gait velocity interpretation: Below normal speed for age/gender           Pertinent Vitals/Pain None verbalized unless asked back 8/10    Home Living Family/patient expects to be discharged to:: Private residence Living Arrangements: Alone Available Help at Discharge: Family;Personal  care attendant;Available PRN/intermittently Type of Home: Apartment Home Access: Stairs to enter Entrance Stairs-Rails: Right Entrance Stairs-Number of Steps: 6 Home Layout: One level Home Equipment: Cane - single point;Shower seat      Prior Function Level of Independence: Independent with assistive device(s)         Comments: used straight cane PRN. Had difficulty with bathing due to needing showerchair     Hand Dominance    Rt UE    Extremity/Trunk Assessment               Lower Extremity Assessment: generalized weakness          Communication   Communication: No difficulties (pt drowsy with slurred speech at this time)  Cognition Arousal/Alertness: Awake/alert Behavior During Therapy: WFL for tasks assessed/performed Overall Cognitive Status: Within Functional Limits for tasks assessed                         Exercises General Exercises - Lower Extremity Hip Flexion/Marching: 5 reps Toe Raises: 5 reps Heel Raises: 5 reps Mini-Sqauts: 5 reps      Assessment/Plan    PT Assessment Patient needs continued PT services  PT Diagnosis Difficulty walking   PT Problem List Decreased strength;Decreased activity tolerance  PT Treatment Interventions Gait training;Therapeutic exercise   PT Goals (Current goals can be found in the Care Plan section) Acute Rehab PT Goals Patient Stated Goal: go home  PT Goal Formulation: With patient Time For  Goal Achievement: 04/03/13 Potential to Achieve Goals: Good    Frequency Min 3X/week   Barriers to discharge        End of Session Equipment Utilized During Treatment: Gait belt Activity Tolerance: Patient tolerated treatment well Patient left: in chair;with chair alarm set         Time: 6734-1937 PT Time Calculation (min): 44 min   Charges:   PT Evaluation $Initial PT Evaluation Tier I: 1 Procedure     PT G Codes:          Tonya Sullivan,Tonya Sullivan 05/01/2013, 9:16 AM

## 2013-04-01 NOTE — Telephone Encounter (Signed)
Discussed hospital care with pt and her nurse They can contact Dr. Dorris Fetch for questions for her Tonya Sullivan Given more reassurance she is getting the care she needs

## 2013-04-01 NOTE — Care Management Utilization Note (Signed)
UR completed 

## 2013-04-01 NOTE — Consult Note (Signed)
Primary cardiologist: Dr. Jenell Milliner Consulting cardiologist: Dr. Carlyle Dolly MD  Clinical Summary Ms. Drawdy is a 58 y.o.female   HTN, COPD, chronic anxiety, bipolar, OCD, hyperlipidemia, recent admission for hyponatremia admitted with AMS, hyponatremia, and hypotension BP and sodium have improved with IVF, she is also being treated for adrenal insuffuciency.   Cardiology consulted for elevated troponins. Patient denies any chest pain or SOB.  08/2012 echo: LVEF 60-65%, no WMAs, grade I diastolic dysfunction. 03/2011 exercise MPI: exercised 3 minutes, reached THR, no definite ischemia.  UDS negative, trop 0.45 without peak,. EKG NSR with non-spec ST/T changes, CXR poor film with non-specific findings. Noted low blood pressures 90s/70s early during admission that have improved.     Allergies  Allergen Reactions  . Codeine Itching  . Cyclobenzaprine Hives  . Darvocet [Propoxyphene N-Acetaminophen] Rash  . Abilify [Aripiprazole] Other (See Comments)    tremors  . Ace Inhibitors   . Adhesive [Tape] Hives  . Gabapentin Swelling  . Iodine Hives  . Levofloxacin Hives and Hypertension  . Sulfa Antibiotics Itching  . Robaxin [Methocarbamol] Rash    Medications Scheduled Medications: . aspirin  300 mg Rectal Daily   Or  . aspirin  325 mg Oral Daily  . enoxaparin (LOVENOX) injection  40 mg Subcutaneous Q24H  . hydrocortisone sod succinate (SOLU-CORTEF) inj  50 mg Intravenous Q8H  . simvastatin  20 mg Oral q1800  . tiotropium  18 mcg Inhalation Daily     Infusions: . sodium chloride 75 mL/hr at 04/01/13 1230     PRN Medications:  albuterol, oxyCODONE-acetaminophen, senna-docusate   Past Medical History  Diagnosis Date  . COPD (chronic obstructive pulmonary disease)   . Depression   . Chronic back pain     DDD, disc bulge, radiculopathy, spinal stenosis  . Hyperlipidemia   . Anxiety   . Bipolar disorder, unspecified   . Carotid artery calcification   . DDD  (degenerative disc disease), lumbosacral   . Spinal stenosis, lumbar   . Thyroid disease   . Autoimmune thyroiditis   . Hypertension   . TIA (transient ischemic attack)     august 2014  . GERD (gastroesophageal reflux disease)   . H/O dizziness   . Neuropathic pain   . Adrenal gland dysfunction     Addison's disease ON DAILY PREDNISONE  . Addison's disease   . Stroke     HX OF MINI STROKE AUG 2014- DROPPIN EVERYTHING, SPEECH SLURRED, MEMORY AFFECTED --ALL RESOLVED - NO DEFICITS NOW  . Pneumonia     LAST PNEUMONIA WAS AUG 2014  . Achalasia   . Mouth ulcers   . Dysrhythmia     palpitations  . Melanoma     removed 1 week ago at Dr Teryl Lucy office    Past Surgical History  Procedure Laterality Date  . Appendectomy    . Cholecystectomy    . Lump left breast      removed- benign  . Abdominal hysterectomy      tubal pregnancy  . Inguinal hernia repair    . Stomach surgery      ?holes in esophgous  . Ectopic pregnancy surgery    . Back surgery      x 5;1984;1989;;1999;2000;2010  . Colonoscopy  01/25/2012    Procedure: COLONOSCOPY;  Surgeon: Rogene Houston, MD;  Location: [facility];  Service: Endoscopy;  Laterality: N/A;  130  . Esophagogastroduodenoscopy (egd) with esophageal dilation N/A 08/13/2012    Procedure: ESOPHAGOGASTRODUODENOSCOPY (EGD) WITH  ESOPHAGEAL DILATION;  Surgeon: Rogene Houston, MD;  Location: [facility];  Service: Endoscopy;  Laterality: N/A;  315  . Breast surgery      left breast  . Anterior cervical decomp/discectomy fusion N/A 10/21/2012    Procedure: Cervical Six-Seven Anterior cervical decompression/diskectomy/fusion;  Surgeon: Kristeen Miss, MD;  Location: [facility];  Service: Neurosurgery;  Laterality: N/A;  Cervical Six-Seven Anterior cervical decompression/diskectomy/fusion  . Cataract extraction w/phaco Right 11/25/2012    Procedure: CATARACT EXTRACTION PHACO AND INTRAOCULAR LENS PLACEMENT (IOC);  Surgeon: Elta Guadeloupe T. Gershon Crane, MD;  Location: [facility];  Service: Ophthalmology;  Laterality: Right;  CDE:10.06  . Cataract extraction w/phaco Left 12/09/2012    Procedure: CATARACT EXTRACTION PHACO AND INTRAOCULAR LENS PLACEMENT (IOC);  Surgeon: Elta Guadeloupe T. Gershon Crane, MD;  Location: [facility];  Service: Ophthalmology;  Laterality: Left;  CDE:5.06  . Tonsillectomy    . Hiatal hernia repair    . Heller myotomy N/A 03/17/2013    Procedure: DIAGNOSTIC LAPAROSCOPY, LAPAROSCOPIC HELLER MYOTOMY, ENDOSCOPY, DOR FUNDOPLICATION;  Surgeon: Ralene Ok, MD;  Location: [facility];  Service: General;  Laterality: N/A;    Family History  Problem Relation Age of Onset  . Hypertension Mother   . Heart disease Mother   . Heart attack Mother   . Anxiety disorder Mother   . Cancer Father     lung cancer  . Heart attack Father   . Alcohol abuse Father   . Hypertension Brother   . Alcohol abuse Brother   . Bipolar disorder Brother   . ADD / ADHD Brother   . Drug abuse Brother   . OCD Brother   . Alcohol abuse Sister   . Bipolar disorder Sister   . Anxiety disorder Sister   . ADD / ADHD Sister   . Drug abuse Sister   . Physical abuse Sister   . Sexual abuse Sister   . Alcohol abuse Brother   . Bipolar disorder Brother   . ADD / ADHD Brother   . Drug abuse Brother   . Alcohol abuse Sister   . Bipolar disorder Sister   . Anxiety disorder Sister   . ADD / ADHD Sister   . Physical abuse Sister   . Sexual abuse Sister   . Alcohol abuse Brother   . Bipolar disorder Brother   . ADD / ADHD Brother   . Bipolar disorder Brother   . ADD / ADHD Brother   . Alcohol abuse Brother   . Paranoid behavior Brother   . Physical abuse Brother   . Sexual abuse Brother   . Bipolar disorder Maternal Aunt   . Bipolar disorder Paternal Aunt   . Bipolar disorder Maternal Uncle   . Bipolar disorder Paternal Uncle   . Bipolar disorder Maternal Grandfather   . Alcohol abuse Maternal Grandfather   . Bipolar disorder Maternal Grandmother   . Alcohol abuse Maternal  Grandmother   . Anxiety disorder Maternal Grandmother   . Dementia Maternal Grandmother   . Bipolar disorder Paternal Grandfather   . Alcohol abuse Paternal Grandfather   . Bipolar disorder Paternal Grandmother   . Alcohol abuse Paternal Grandmother   . Drug abuse Paternal Grandmother   . Bipolar disorder Maternal Uncle   . Schizophrenia Neg Hx   . Seizures Neg Hx     Social History Ms. Donlan reports that she has been smoking Cigarettes.  She has a 10 pack-year smoking history. She has never used smokeless tobacco. Ms. Sleeper reports that she  does not drink alcohol.  Review of Systems CONSTITUTIONAL: No weight loss, fever, chills, weakness or fatigue.  HEENT: Eyes: No visual loss, blurred vision, double vision or yellow sclerae. No hearing loss, sneezing, congestion, runny nose or sore throat.  SKIN: No rash or itching.  CARDIOVASCULAR: No chest pain, chest pressure or chest discomfort. No palpitations or edema.  RESPIRATORY: No shortness of breath, cough or sputum.  GASTROINTESTINAL: No anorexia, nausea, vomiting or diarrhea. No abdominal pain or blood.  GENITOURINARY: no polyuria, no dysuria NEUROLOGICAL: confusion MUSCULOSKELETAL: chronic back pain HEMATOLOGIC: No anemia, bleeding or bruising.  LYMPHATICS: No enlarged nodes. No history of splenectomy.  PSYCHIATRIC: No history of depression or anxiety.      Physical Examination Blood pressure 107/52, pulse 69, temperature 98 F (36.7 C), temperature source Oral, resp. rate 18, height 5\' 7"  (1.702 m), weight 198 lb 10.9 oz (90.12 kg), SpO2 96.00%.  Intake/Output Summary (Last 24 hours) at 04/01/13 1533 Last data filed at 04/01/13 1200  Gross per 24 hour  Intake    240 ml  Output      0 ml  Net    240 ml    Cardiovascular: RRR, no m/r/g, no JVD  Respiratory: CTAB  GI: abdomen soft, NT, ND  MSK: no LE edema  Psych: appropriate affect   Lab Results  Basic Metabolic Panel:  Recent Labs Lab 03/29/13 0544  03/31/13 2220 04/01/13 0317 04/01/13 0711 04/01/13 1030  NA 132* 122* 128* 132* 132*  K 3.9 3.3* 3.1* 4.1 4.6  CL 93* 81* 88* 93* 93*  CO2 32 30 33* 32 30  GLUCOSE 94 104* 98 106* 122*  BUN 5* 9 7 6 7   CREATININE 0.76 0.73 0.88 0.83 0.81  CALCIUM 8.7 8.8 8.3* 8.3* 8.4  MG  --   --  2.3  --   --     Liver Function Tests:  Recent Labs Lab 03/28/13 0432 03/31/13 2220 04/01/13 0317  AST 16 16 17   ALT 15 18 15   ALKPHOS 65 67 60  BILITOT 0.3 0.4 0.3  PROT 6.4 6.8 6.0  ALBUMIN 3.1* 3.3* 3.0*    CBC:  Recent Labs Lab 03/27/13 1318 03/28/13 0432 03/31/13 2220 04/01/13 0317  WBC 8.2 6.1 5.0 DUPLICATE  NEUTROABS  --   --  3.3 PENDING  HGB 27.0 35.0 09.3 DUPLICATE  HCT 81.8 29.9* 37.1 DUPLICATE  MCV 69.6 78.9 38.1 DUPLICATE  PLT 017 510 258 DUPLICATE    Cardiac Enzymes:  Recent Labs Lab 04/01/13 0317 04/01/13 1009  TROPONINI 0.43* 0.45*    BNP: No components found with this basename: POCBNP,    ECG NSR, non-spec ST/T changes  Imaging 08/2012 Carotid US - Mild techanical difficulty due to high bifurcations especially on the left which was at the jaw line. There was also vocal interference due to constant talking. - Bilateral - 1% to 39% ICA stenosis. Vertebral artery flow is antegrade. Other specific details can be found in the table(s) above. Prepared and Electronically Authenticated by    Impression/Recommendations 1. Troponin elevation - mild increase at 0.45 though no established peak yet, non-spec EKG changes. Patient with no cardiac symptoms - likely related to decreased coronary perfusion pressures in the setting of systemic hypotension and hypovolemia, as well as increased demand in the setting of multiple metabolic derangements - clinical picture not consistent with ACS - will add AM troponin to follow trend along with echo, no significant changes will not pursue further workup as inpatient.  Carlyle Dolly, M.D., F.A.C.C.

## 2013-04-02 DIAGNOSIS — I379 Nonrheumatic pulmonary valve disorder, unspecified: Secondary | ICD-10-CM

## 2013-04-02 LAB — BASIC METABOLIC PANEL
BUN: 4 mg/dL — ABNORMAL LOW (ref 6–23)
CALCIUM: 8.4 mg/dL (ref 8.4–10.5)
CO2: 30 mEq/L (ref 19–32)
CREATININE: 0.71 mg/dL (ref 0.50–1.10)
Chloride: 98 mEq/L (ref 96–112)
GFR calc Af Amer: >90 mL/min (ref >90)
GFR calc non Af Amer: >90 mL/min (ref >90)
GLUCOSE: 110 mg/dL — AB (ref 70–99)
Potassium: 4 mEq/L (ref 3.7–5.3)
SODIUM: 136 meq/L — AB (ref 137–147)

## 2013-04-02 LAB — TROPONIN I: Troponin I: 0.45 ng/mL (ref <0.30)

## 2013-04-02 LAB — CBC
HCT: 33.8 % — ABNORMAL LOW (ref 36.0–46.0)
HEMOGLOBIN: 11.6 g/dL — AB (ref 12.0–15.0)
MCH: 32.1 pg (ref 26.0–34.0)
MCHC: 34.3 g/dL (ref 30.0–36.0)
MCV: 93.6 fL (ref 78.0–100.0)
Platelets: 236 10*3/uL (ref 150–400)
RBC: 3.61 MIL/uL — ABNORMAL LOW (ref 3.87–5.11)
RDW: 13.6 % (ref 11.5–15.5)
WBC: 4 10*3/uL (ref 4.0–10.5)

## 2013-04-02 MED ORDER — CLONAZEPAM 1 MG PO TABS: 1.0000 mg | ORAL_TABLET | Freq: Four times a day (QID) | ORAL | Status: DC | PRN

## 2013-04-02 MED ORDER — LIDOCAINE 5 % EX PTCH
2.0000 | MEDICATED_PATCH | CUTANEOUS | Status: DC
  Administered 2013-04-02: 2 via TRANSDERMAL
  Filled 2013-04-02 (×2): qty 2

## 2013-04-02 MED ORDER — OXYCODONE-ACETAMINOPHEN 10-325 MG PO TABS: 1.0000 | ORAL_TABLET | Freq: Three times a day (TID) | ORAL | Status: DC | PRN

## 2013-04-02 NOTE — Progress Notes (Signed)
Patient ID: Tonya Sullivan, female   DOB: [birth year], 58 y.o.   MRN: [id number]    Subjective:    Patient with no complaints this morning.   Objective:   Temp:  [97.9 F (36.6 C)-98.3 F (36.8 C)] 98.1 F (36.7 C) (04/02 0557) Pulse Rate:  [66-74] 66 (04/02 0557) Resp:  [18-20] 20 (04/02 0557) BP: (107-129)/(50-72) 129/72 mmHg (04/02 0557) SpO2:  [96 %-98 %] 97 % (04/02 0557) Weight:  [207 lb 0.2 oz (93.9 kg)] 207 lb 0.2 oz (93.9 kg) (04/02 0557) Last BM Date: 03/31/13  Filed Weights   03/31/13 2144 04/01/13 0150 04/02/13 0557  Weight: 196 lb (88.905 kg) 198 lb 10.9 oz (90.12 kg) 207 lb 0.2 oz (93.9 kg)    Intake/Output Summary (Last 24 hours) at 04/02/13 0823 Last data filed at 04/02/13 0501  Gross per 24 hour  Intake 2845.83 ml  Output      0 ml  Net 2845.83 ml    Telemetry:Normal sinus rhythm  Exam:  General: NAD  Resp: CTAB  Cardiac: RRR, no m/r/g, no JVD, no carotid bruits  KP:TWSFKCL soft, NT, ND  MSK: no LE edema  Neuro: no focal deficits   Lab Results:  Basic Metabolic Panel:  Recent Labs Lab 04/01/13 0317 04/01/13 0711 04/01/13 1030 04/02/13 0616  NA 128* 132* 132* 136*  K 3.1* 4.1 4.6 4.0  CL 88* 93* 93* 98  CO2 33* 32 30 30  GLUCOSE 98 106* 122* 110*  BUN 7 6 7  4*  CREATININE 0.88 0.83 0.81 0.71  CALCIUM 8.3* 8.3* 8.4 8.4  MG 2.3  --   --   --     Liver Function Tests:  Recent Labs Lab 03/28/13 0432 03/31/13 2220 04/01/13 0317  AST 16 16 17   ALT 15 18 15   ALKPHOS 65 67 60  BILITOT 0.3 0.4 0.3  PROT 6.4 6.8 6.0  ALBUMIN 3.1* 3.3* 3.0*    CBC:  Recent Labs Lab 03/31/13 2220 04/01/13 0317 04/02/13 2751  WBC 5.0 DUPLICATE 4.0  HGB 70.0 DUPLICATE 17.4*  HCT 94.4 DUPLICATE 96.7*  MCV 59.1 DUPLICATE 63.8  PLT 466 DUPLICATE 599    Cardiac Enzymes:  Recent Labs Lab 04/01/13 1009 04/01/13 1606 04/02/13 0616  TROPONINI 0.45* 0.45* 0.45*    BNP:  Recent Labs  08/03/12 0850 10/25/12 0930  PROBNP 745.1*  209.0*    Coagulation: No results found for this basename: INR,  in the last 168 hours  ECG:   Medications:   Scheduled Medications: . aspirin  300 mg Rectal Daily   Or  . aspirin  325 mg Oral Daily  . enoxaparin (LOVENOX) injection  40 mg Subcutaneous Q24H  . hydrocortisone sod succinate (SOLU-CORTEF) inj  50 mg Intravenous Q8H  . lidocaine  2 patch Transdermal Q24H  . simvastatin  20 mg Oral q1800  . tiotropium  18 mcg Inhalation Daily     Infusions: . sodium chloride 75 mL/hr at 04/02/13 0501     PRN Medications:  albuterol, clonazePAM, ondansetron, oxyCODONE-acetaminophen, senna-docusate     Assessment/Plan    1. Troponin elevation  - mild increase at 0.45, non-spec EKG changes. Patient with no cardiac symptoms  - likely related to decreased coronary perfusion pressures in the setting of systemic hypotension and hypovolemia, as well as increased demand in the setting of multiple metabolic derangements  - clinical picture not consistent with ACS  - will follow up echo results, if no significant abnormalities no further cardiac testing planned at  this point  2. Hyponatremia - resolved with fluid resuscitation  3. Hypotension - resolved with IVF      Carlyle Dolly, M.D., F.A.C.C.

## 2013-04-02 NOTE — Progress Notes (Signed)
Physical Therapy Treatment Patient Details Name: Tonya Sullivan MRN: [id number] DOB: 13-Aug-1955 Today's Date: 04/02/2013    History of Present Illness      PT Comments    Pt eager to complete therapy.  Pt independent bed mobility with no assistance required, pt able to demonstrate safe mechanics with sit to stand using correct handplacement with no cueing required.  Gait training with RW SBA x 587feet with no LOB episodes noted.  Began stair training, pt able to demonstrate safe reciprocal pattern with 1 handrail multiple times.  Pt left sitting on EOB per request with call bell within reach.  Follow Up Recommendations        Equipment Recommendations       Recommendations for Other Services       Precautions / Restrictions Precautions Precautions: Fall Restrictions Weight Bearing Restrictions: No    Mobility  Bed Mobility Overal bed mobility: Independent                Transfers Overall transfer level: Independent                  Ambulation/Gait Ambulation/Gait assistance: Modified independent (Device/Increase time) Ambulation Distance (Feet): 540 Feet Assistive device: Rolling walker (2 wheeled) Gait Pattern/deviations: Decreased stride length   Gait velocity interpretation: at or above normal speed for age/gender General Gait Details: cueing to extend knee fully with gait   Stairs Stairs: Yes Stairs assistance: Supervision Stair Management: One rail Left Number of Stairs: 4 (5 reps reciprocal pattern)    Wheelchair Mobility    Modified Rankin (Stroke Patients Only)       Balance                                    Cognition Arousal/Alertness: Awake/alert Behavior During Therapy: WFL for tasks assessed/performed Overall Cognitive Status: Within Functional Limits for tasks assessed                      Exercises      General Comments        Home Living                      Prior Function             PT Goals (current goals can now be found in the care plan section) Progress towards PT goals: Progressing toward goals    Frequency       PT Plan Current plan remains appropriate    Co-evaluation             End of Session Equipment Utilized During Treatment: Gait belt Activity Tolerance: Patient tolerated treatment well Patient left: in bed;with call bell/phone within reach (pt request to sit EOB)     Time: 0936-1000 PT Time Calculation (min): 24 min  Charges:  $Gait Training: 23-37 mins                    G Codes:      Aldona Lento 04/02/2013, 12:55 PM

## 2013-04-02 NOTE — Discharge Summary (Signed)
Physician Discharge Summary  Tonya Sullivan PYP:[id number] DOB: [birth year] DOA: 03/31/2013  PCP: Vic Blackbird, MD  Admit date: 03/31/2013 Discharge date: 04/02/2013  Recommendations for Outpatient Follow-up:  1. Pt will need to follow up with PCP in 2-3 weeks post discharge 2. Please obtain BMP to evaluate electrolytes and kidney function 3. Please also check CBC to evaluate Hg and Hct levels  Discharge Diagnoses: Acute encephalopathy   Principal Problem:   Acute encephalopathy Active Problems:   Adrenal cortical insufficiency   Hyponatremia   Weakness   Elevated troponin  Discharge Condition: Stable  Diet recommendation: Heart healthy diet discussed in details   Brief narrative:  58 y.o. female who was just recently discharged after being treated for hyponatremia presented to the ER with main concern of progressively worsening weakness since the discharge. Apon arrival to the ED, pt's systolic BP noted to be in 90's and Na = 122 (was 132 at the time of the discharge 03/29/2013). Pt was rather lethargic upon arrival to the ED and was not able to provide much history.   Principal Problem:  Acute encephalopathy  - unclear etiology and possibly multifactorial secondary to hypotension, dehydration, poor oral intake, hyponatremia, ? Adrenal insufficiency  - pt is now at her baseline this AM and wants to go home  - sodium level is trending up and BP is in 110 - 110's this AM - no events on telemetry  Active Problems:  Elevated troponins  - unclear etiology, pt denies chest pain or shortness of breath this AM  - 2 D ECHO with normal EF and no diastolic dysfunction - clear for discharge from cardio standpoint  Adrenal cortical insufficiency  - continued stress dose steroids inpatient Hyponatremia  - most likely pre renal and secondary to poor oral intake  - trend 122 --> 132   Consultants:  Cardiology  Procedures/Studies:  Ct Head Wo Contrast 04/01/2013 No acute intracranial  process. Mild parenchymal brain volume loss, advanced for age though stable from prior examination.  Dg Chest Port 1 View 04/01/2013 Degraded examination with similar findings of bibasilar atelectasis/ scar.  Antibiotics:  None  Code Status: Full  Family Communication: No family at bedside   Discharge Exam: Filed Vitals:   04/02/13 0557  BP: 129/72  Pulse: 66  Temp: 98.1 F (36.7 C)  Resp: 20   Filed Vitals:   04/01/13 1442 04/01/13 2051 04/02/13 0000 04/02/13 0557  BP: 107/52 119/66 107/50 129/72  Pulse: 69 74 70 66  Temp: 98 F (36.7 C) 98.3 F (36.8 C) 97.9 F (36.6 C) 98.1 F (36.7 C)  TempSrc: Oral Oral Oral Oral  Resp: 18 20 20 20   Height:      Weight:    93.9 kg (207 lb 0.2 oz)  SpO2: 96% 98% 96% 97%    General: Pt is alert, follows commands appropriately, not in acute distress Cardiovascular: Regular rate and rhythm, S1/S2 +, no murmurs, no rubs, no gallops Respiratory: Clear to auscultation bilaterally, no wheezing, no crackles, no rhonchi Abdominal: Soft, non tender, non distended, bowel sounds +, no guarding Extremities: no edema, no cyanosis, pulses palpable bilaterally DP and PT Neuro: Grossly nonfocal  Discharge Instructions   Future Appointments Provider Department Dept Phone   04/06/2013 11:50 AM Ralene Ok, MD [facility], [place] [phone]   04/27/2013 12:30 PM Alycia Rossetti, MD [place] [phone]       Medication List         albuterol 108 (90 BASE)  MCG/ACT inhaler  Commonly known as:  PROAIR HFA  Inhale 2 puffs into the lungs every 6 (six) hours as needed for wheezing or shortness of breath.     albuterol (2.5 MG/3ML) 0.083% nebulizer solution  Commonly known as:  PROVENTIL  Take 3 mLs (2.5 mg total) by nebulization every 4 (four) hours as needed for wheezing.     amitriptyline 100 MG tablet  Commonly known as:  ELAVIL  Take 1 tablet (100 mg total) by mouth at bedtime.     anti-nausea solution   Take 10 mLs by mouth every 15 (fifteen) minutes as needed for nausea or vomiting.     baclofen 10 MG tablet  Commonly known as:  LIORESAL  Take 1 tablet (10 mg total) by mouth 3 (three) times daily.     clonazePAM 1 MG tablet  Commonly known as:  KLONOPIN  Take 1 tablet (1 mg total) by mouth 4 (four) times daily as needed for anxiety.     DULoxetine 60 MG capsule  Commonly known as:  CYMBALTA  Take 1 capsule (60 mg total) by mouth every morning.     escitalopram 20 MG tablet  Commonly known as:  LEXAPRO  Take 1 tablet (20 mg total) by mouth every morning.     fluocinonide gel 0.05 %  Commonly known as:  LIDEX  Apply 1 application topically 3 (three) times daily. To mouth ulcers     hydrochlorothiazide 25 MG tablet  Commonly known as:  HYDRODIURIL  Take 25 mg by mouth every morning.     lidocaine 2 % solution  Commonly known as:  XYLOCAINE  Use as directed 15 mLs in the mouth or throat every 3 (three) hours as needed for mouth pain.     lidocaine 5 %  Commonly known as:  LIDODERM  - Place 1 patch onto the skin daily as needed (pain). Remove & Discard patch within 12 hours or as directed by MD  - WEARS PATCH ON LOWER BACK FOR BACK PAIN     losartan 50 MG tablet  Commonly known as:  COZAAR  Take 1 tablet (50 mg total) by mouth every morning.     ondansetron 4 MG disintegrating tablet  Commonly known as:  ZOFRAN ODT  Take 1 tablet (4 mg total) by mouth every 4 (four) hours as needed for nausea or vomiting.     oxybutynin 10 MG 24 hr tablet  Commonly known as:  DITROPAN XL  Take 1 tablet (10 mg total) by mouth daily.     oxyCODONE-acetaminophen 10-325 MG per tablet  Commonly known as:  PERCOCET  Take 1 tablet by mouth every 8 (eight) hours as needed for pain.     pravastatin 40 MG tablet  Commonly known as:  PRAVACHOL  Take 1 tablet (40 mg total) by mouth every evening.     predniSONE 2.5 MG tablet  Commonly known as:  DELTASONE  Take 3 tablets (7.5 mg total) by  mouth daily with breakfast.     propranolol 10 MG tablet  Commonly known as:  INDERAL  Take 1 tablet (10 mg total) by mouth 3 (three) times daily.     tetrahydrozoline 0.05 % ophthalmic solution  Place 1 drop into both eyes 2 (two) times daily as needed (dry eyes).     tiotropium 18 MCG inhalation capsule  Commonly known as:  SPIRIVA  Place 1 capsule (18 mcg total) into inhaler and inhale daily. USES IN THE EVENING  Follow-up Information   Schedule an appointment as soon as possible for a visit with Vic Blackbird, MD.   Specialty:  Family Medicine   Contact information:   [address] [place] [address] [phone]        The results of significant diagnostics from this hospitalization (including imaging, microbiology, ancillary and laboratory) are listed below for reference.     Microbiology: Recent Results (from the past 240 hour(s))  KOH PREP     Status: None   Collection Time    03/27/13  6:15 PM      Result Value Ref Range Status   Specimen Description THROAT   Final   Special Requests NONE   Final   KOH Prep FEW YEAST   Final   Report Status 03/27/2013 FINAL   Final     Labs: Basic Metabolic Panel:  Recent Labs Lab 03/31/13 2220 04/01/13 0317 04/01/13 0711 04/01/13 1030 04/02/13 0616  NA 122* 128* 132* 132* 136*  K 3.3* 3.1* 4.1 4.6 4.0  CL 81* 88* 93* 93* 98  CO2 30 33* 32 30 30  GLUCOSE 104* 98 106* 122* 110*  BUN 9 7 6 7  4*  CREATININE 0.73 0.88 0.83 0.81 0.71  CALCIUM 8.8 8.3* 8.3* 8.4 8.4  MG  --  2.3  --   --   --    Liver Function Tests:  Recent Labs Lab 03/28/13 0432 03/31/13 2220 04/01/13 0317  AST 16 16 17   ALT 15 18 15   ALKPHOS 65 67 60  BILITOT 0.3 0.4 0.3  PROT 6.4 6.8 6.0  ALBUMIN 3.1* 3.3* 3.0*   No results found for this basename: LIPASE, AMYLASE,  in the last 168 hours  Recent Labs Lab 04/01/13 0317  AMMONIA 15   CBC:  Recent Labs Lab 03/27/13 1318 03/28/13 0432 03/31/13 2220  04/01/13 0317 04/02/13 0616  WBC 8.2 6.1 5.0 DUPLICATE 4.0  NEUTROABS  --   --  3.3 PENDING  --   HGB 66.2 94.7 65.4 DUPLICATE 65.0*  HCT 35.4 65.6* 81.2 DUPLICATE 75.1*  MCV 70.0 17.4 94.4 DUPLICATE 96.7  PLT 591 638 466 DUPLICATE 599   Cardiac Enzymes:  Recent Labs Lab 04/01/13 0317 04/01/13 1009 04/01/13 1606 04/02/13 0616  TROPONINI 0.43* 0.45* 0.45* 0.45*   BNP: BNP (last 3 results)  Recent Labs  08/03/12 0850 10/25/12 0930  PROBNP 745.1* 209.0*   CBG: No results found for this basename: GLUCAP,  in the last 168 hours   SIGNED: Time coordinating discharge: Over 30 minutes  Faye Ramsay, MD  [facility] 04/02/2013, 11:09 AM Pager [phone]  If 7PM-7AM, please contact night-coverage [email or web address] Password [email or web address]

## 2013-04-02 NOTE — Discharge Instructions (Signed)
Altered Mental Status Altered mental status most often refers to an abnormal change in your responsiveness and awareness. It can affect your speech, thought, mobility, memory, attention span, or alertness. It can range from slight confusion to complete unresponsiveness (coma). Altered mental status can be a sign of a serious underlying medical condition. Rapid evaluation and medical treatment is necessary for patients having an altered mental status. CAUSES   Low blood sugar (hypoglycemia) or diabetes.  Severe loss of body fluids (dehydration) or a body salt (electrolyte) imbalance.  A stroke or other neurologic problem, such as dementia or delirium.  A head injury or tumor.  A drug or alcohol overdose.  Exposure to toxins or poisons.  Depression, anxiety, and stress.  A low oxygen level (hypoxia).  An infection.  Blood loss.  Twitching or shaking (seizure).  Heart problems, such as heart attack or heart rhythm problems (arrhythmias).  A body temperature that is too low or too high (hypothermia or hyperthermia). DIAGNOSIS  A diagnosis is based on your history, symptoms, physical and neurologic examinations, and diagnostic tests. Diagnostic tests may include:  Measurement of your blood pressure, pulse, breathing, and oxygen levels (vital signs).  Blood tests.  Urine tests.  X-ray exams.  A computerized magnetic scan (magnetic resonance imaging, MRI).  A computerized X-ray scan (computed tomography, CT scan). TREATMENT  Treatment will depend on the cause. Treatment may include:  Management of an underlying medical or mental health condition.  Critical care or support in the hospital. [place]   Only take over-the-counter or prescription medicines for pain, discomfort, or fever as directed by your caregiver.  Manage underlying conditions as directed by your caregiver.  Eat a healthy, well-balanced diet to maintain strength.  Join a support group or  prevention program to cope with the condition or trauma that caused the altered mental status. Ask your caregiver to help choose a program that works for you.  Follow up with your caregiver for further examination, therapy, or testing as directed. SEEK MEDICAL CARE IF:   You feel unwell or have chills.  You or your family notice a change in your behavior or your alertness.  You have trouble following your caregiver's treatment plan.  You have questions or concerns. SEEK IMMEDIATE MEDICAL CARE IF:   You have a rapid heartbeat or have chest pain.  You have difficulty breathing.  You have a fever.  You have a headache with a stiff neck.  You cough up blood.  You have blood in your urine or stool.  You have severe agitation or confusion. MAKE SURE YOU:   Understand these instructions.  Will watch your condition.  Will get help right away if you are not doing well or get worse. Document Released: 06/07/2009 Document Revised: 03/12/2011 Document Reviewed: 06/07/2009 [facility] Patient Information 2014 [employer].

## 2013-04-02 NOTE — Progress Notes (Signed)
*  PRELIMINARY RESULTS* Echocardiogram 2D Echocardiogram has been performed.  [place], [place] 04/02/2013, 2:23 PM

## 2013-04-02 NOTE — Progress Notes (Signed)
Patient states understanding of discharge instructions, prescriptions given 

## 2013-04-06 ENCOUNTER — Encounter (INDEPENDENT_AMBULATORY_CARE_PROVIDER_SITE_OTHER): Payer: Self-pay | Admitting: General Surgery

## 2013-04-06 ENCOUNTER — Ambulatory Visit (INDEPENDENT_AMBULATORY_CARE_PROVIDER_SITE_OTHER): Payer: Medicare Other | Admitting: General Surgery

## 2013-04-06 VITALS — BP 108/74 | HR 70 | Temp 97.0°F | Resp 16 | Ht 67.0 in | Wt 199.2 lb

## 2013-04-06 DIAGNOSIS — Z9889 Other specified postprocedural states: Secondary | ICD-10-CM

## 2013-04-06 MED ORDER — PANTOPRAZOLE SODIUM 20 MG PO TBEC: 20.0000 mg | DELAYED_RELEASE_TABLET | Freq: Two times a day (BID) | ORAL | Status: DC

## 2013-04-06 NOTE — Progress Notes (Signed)
Patient ID: Tonya Sullivan, female   DOB: Mar 25, 1955, 58 y.o.   MRN: [id number] Post op course The patient is a 58 year old female status post lap Heller myotomy and Dor wrap. The patient was recently hospitalized secondary to hyponatremia, this is since resolved. The patient does state she's having some reflux at this time however she is able to eat well. She states she is having no chest pain while eating.  On Exam: Wounds are clean dry and intact  Assessment and Plan 58 year old female status post laparoscopic cholecystectomy her myotomy Dor fundoplication. 1. The patient states she is tolerating by mouth well. At this point we'll open her diet choices up to ad lib. Shestates she stay away from bread and meat at this time. I would continue stay without diet at this time. 2. We'll have the patient follow back up in 4 weeks. 3. I will give the patient prescription for Protonix  for her GERD symptoms. She will likely need this for life long.  Ralene Ok, MD [facility] [place] General & Minimally Invasive Surgery Trauma & Emergency Surgery

## 2013-04-08 ENCOUNTER — Inpatient Hospital Stay (HOSPITAL_COMMUNITY)
Admission: EM | Admit: 2013-04-08 | Discharge: 2013-04-14 | DRG: 071 | Disposition: A | Discharge status: Skilled Nursing Facility | Payer: Medicare Other | Attending: Internal Medicine | Admitting: Internal Medicine

## 2013-04-08 ENCOUNTER — Encounter (HOSPITAL_COMMUNITY): Payer: Self-pay | Admitting: Emergency Medicine

## 2013-04-08 ENCOUNTER — Other Ambulatory Visit: Payer: Self-pay | Admitting: Family Medicine

## 2013-04-08 ENCOUNTER — Emergency Department (HOSPITAL_COMMUNITY): Payer: Medicare Other

## 2013-04-08 DIAGNOSIS — R609 Edema, unspecified: Secondary | ICD-10-CM

## 2013-04-08 DIAGNOSIS — G459 Transient cerebral ischemic attack, unspecified: Secondary | ICD-10-CM

## 2013-04-08 DIAGNOSIS — IMO0002 Reserved for concepts with insufficient information to code with codable children: Secondary | ICD-10-CM

## 2013-04-08 DIAGNOSIS — R490 Dysphonia: Secondary | ICD-10-CM

## 2013-04-08 DIAGNOSIS — W19XXXA Unspecified fall, initial encounter: Secondary | ICD-10-CM

## 2013-04-08 DIAGNOSIS — I6529 Occlusion and stenosis of unspecified carotid artery: Secondary | ICD-10-CM

## 2013-04-08 DIAGNOSIS — R531 Weakness: Secondary | ICD-10-CM

## 2013-04-08 DIAGNOSIS — F32A Depression, unspecified: Secondary | ICD-10-CM

## 2013-04-08 DIAGNOSIS — R1310 Dysphagia, unspecified: Secondary | ICD-10-CM

## 2013-04-08 DIAGNOSIS — Z981 Arthrodesis status: Secondary | ICD-10-CM

## 2013-04-08 DIAGNOSIS — N179 Acute kidney failure, unspecified: Secondary | ICD-10-CM | POA: Diagnosis present

## 2013-04-08 DIAGNOSIS — M47812 Spondylosis without myelopathy or radiculopathy, cervical region: Secondary | ICD-10-CM

## 2013-04-08 DIAGNOSIS — K219 Gastro-esophageal reflux disease without esophagitis: Secondary | ICD-10-CM | POA: Diagnosis present

## 2013-04-08 DIAGNOSIS — F329 Major depressive disorder, single episode, unspecified: Secondary | ICD-10-CM

## 2013-04-08 DIAGNOSIS — R002 Palpitations: Secondary | ICD-10-CM

## 2013-04-08 DIAGNOSIS — R233 Spontaneous ecchymoses: Secondary | ICD-10-CM

## 2013-04-08 DIAGNOSIS — E871 Hypo-osmolality and hyponatremia: Secondary | ICD-10-CM | POA: Diagnosis present

## 2013-04-08 DIAGNOSIS — J449 Chronic obstructive pulmonary disease, unspecified: Secondary | ICD-10-CM | POA: Diagnosis present

## 2013-04-08 DIAGNOSIS — R4182 Altered mental status, unspecified: Secondary | ICD-10-CM

## 2013-04-08 DIAGNOSIS — R471 Dysarthria and anarthria: Secondary | ICD-10-CM

## 2013-04-08 DIAGNOSIS — R232 Flushing: Secondary | ICD-10-CM

## 2013-04-08 DIAGNOSIS — E2749 Other adrenocortical insufficiency: Secondary | ICD-10-CM | POA: Diagnosis present

## 2013-04-08 DIAGNOSIS — R7989 Other specified abnormal findings of blood chemistry: Secondary | ICD-10-CM

## 2013-04-08 DIAGNOSIS — M549 Dorsalgia, unspecified: Secondary | ICD-10-CM | POA: Diagnosis present

## 2013-04-08 DIAGNOSIS — K0889 Other specified disorders of teeth and supporting structures: Secondary | ICD-10-CM

## 2013-04-08 DIAGNOSIS — E2740 Unspecified adrenocortical insufficiency: Secondary | ICD-10-CM

## 2013-04-08 DIAGNOSIS — Z8249 Family history of ischemic heart disease and other diseases of the circulatory system: Secondary | ICD-10-CM

## 2013-04-08 DIAGNOSIS — E785 Hyperlipidemia, unspecified: Secondary | ICD-10-CM | POA: Diagnosis present

## 2013-04-08 DIAGNOSIS — F411 Generalized anxiety disorder: Secondary | ICD-10-CM | POA: Diagnosis present

## 2013-04-08 DIAGNOSIS — R269 Unspecified abnormalities of gait and mobility: Secondary | ICD-10-CM

## 2013-04-08 DIAGNOSIS — K222 Esophageal obstruction: Secondary | ICD-10-CM

## 2013-04-08 DIAGNOSIS — Z818 Family history of other mental and behavioral disorders: Secondary | ICD-10-CM

## 2013-04-08 DIAGNOSIS — F17200 Nicotine dependence, unspecified, uncomplicated: Secondary | ICD-10-CM | POA: Diagnosis present

## 2013-04-08 DIAGNOSIS — K120 Recurrent oral aphthae: Secondary | ICD-10-CM

## 2013-04-08 DIAGNOSIS — F319 Bipolar disorder, unspecified: Secondary | ICD-10-CM | POA: Diagnosis present

## 2013-04-08 DIAGNOSIS — R488 Other symbolic dysfunctions: Secondary | ICD-10-CM | POA: Clinically undetermined

## 2013-04-08 DIAGNOSIS — G8929 Other chronic pain: Secondary | ICD-10-CM

## 2013-04-08 DIAGNOSIS — E872 Acidosis, unspecified: Secondary | ICD-10-CM

## 2013-04-08 DIAGNOSIS — J310 Chronic rhinitis: Secondary | ICD-10-CM

## 2013-04-08 DIAGNOSIS — R32 Unspecified urinary incontinence: Secondary | ICD-10-CM

## 2013-04-08 DIAGNOSIS — R778 Other specified abnormalities of plasma proteins: Secondary | ICD-10-CM

## 2013-04-08 DIAGNOSIS — G9340 Encephalopathy, unspecified: Principal | ICD-10-CM | POA: Diagnosis present

## 2013-04-08 DIAGNOSIS — I10 Essential (primary) hypertension: Secondary | ICD-10-CM | POA: Diagnosis present

## 2013-04-08 DIAGNOSIS — Z8673 Personal history of transient ischemic attack (TIA), and cerebral infarction without residual deficits: Secondary | ICD-10-CM

## 2013-04-08 DIAGNOSIS — J4489 Other specified chronic obstructive pulmonary disease: Secondary | ICD-10-CM | POA: Diagnosis present

## 2013-04-08 DIAGNOSIS — Z720 Tobacco use: Secondary | ICD-10-CM | POA: Diagnosis present

## 2013-04-08 DIAGNOSIS — E876 Hypokalemia: Secondary | ICD-10-CM | POA: Diagnosis present

## 2013-04-08 LAB — CBC WITH DIFFERENTIAL/PLATELET
BASOS ABS: 0 10*3/uL (ref 0.0–0.1)
Basophils Relative: 0 % (ref 0–1)
EOS ABS: 0.2 10*3/uL (ref 0.0–0.7)
Eosinophils Relative: 3 % (ref 0–5)
HCT: 35.2 % — ABNORMAL LOW (ref 36.0–46.0)
Hemoglobin: 12.1 g/dL (ref 12.0–15.0)
LYMPHS ABS: 2.2 10*3/uL (ref 0.7–4.0)
Lymphocytes Relative: 28 % (ref 12–46)
MCH: 31.7 pg (ref 26.0–34.0)
MCHC: 34.4 g/dL (ref 30.0–36.0)
MCV: 92.1 fL (ref 78.0–100.0)
Monocytes Absolute: 1.1 10*3/uL — ABNORMAL HIGH (ref 0.1–1.0)
Monocytes Relative: 13 % — ABNORMAL HIGH (ref 3–12)
NEUTROS PCT: 56 % (ref 43–77)
Neutro Abs: 4.6 10*3/uL (ref 1.7–7.7)
Platelets: 366 10*3/uL (ref 150–400)
RBC: 3.82 MIL/uL — AB (ref 3.87–5.11)
RDW: 13.6 % (ref 11.5–15.5)
WBC: 8.1 10*3/uL (ref 4.0–10.5)

## 2013-04-08 LAB — COMPREHENSIVE METABOLIC PANEL
ALT: 21 U/L (ref 0–35)
AST: 27 U/L (ref 0–37)
Albumin: 3.7 g/dL (ref 3.5–5.2)
Alkaline Phosphatase: 73 U/L (ref 39–117)
BUN: 10 mg/dL (ref 6–23)
CO2: 33 mEq/L — ABNORMAL HIGH (ref 19–32)
Calcium: 9.5 mg/dL (ref 8.4–10.5)
Chloride: 83 mEq/L — ABNORMAL LOW (ref 96–112)
Creatinine, Ser: 1.28 mg/dL — ABNORMAL HIGH (ref 0.50–1.10)
GFR calc Af Amer: 53 mL/min — ABNORMAL LOW (ref >90)
GFR calc non Af Amer: 45 mL/min — ABNORMAL LOW (ref >90)
GLUCOSE: 106 mg/dL — AB (ref 70–99)
POTASSIUM: 2.9 meq/L — AB (ref 3.7–5.3)
Sodium: 130 mEq/L — ABNORMAL LOW (ref 137–147)
TOTAL PROTEIN: 7.7 g/dL (ref 6.0–8.3)
Total Bilirubin: 0.3 mg/dL (ref 0.3–1.2)

## 2013-04-08 LAB — AMMONIA: Ammonia: 31 umol/L (ref 11–60)

## 2013-04-08 LAB — CBG MONITORING, ED: Glucose-Capillary: 100 mg/dL — ABNORMAL HIGH (ref 70–99)

## 2013-04-08 LAB — URINALYSIS, ROUTINE W REFLEX MICROSCOPIC
Bilirubin Urine: NEGATIVE
GLUCOSE, UA: NEGATIVE mg/dL
Hgb urine dipstick: NEGATIVE
KETONES UR: NEGATIVE mg/dL
Leukocytes, UA: NEGATIVE
Nitrite: NEGATIVE
PROTEIN: NEGATIVE mg/dL
Specific Gravity, Urine: 1.01 (ref 1.005–1.030)
UROBILINOGEN UA: 1 mg/dL (ref 0.0–1.0)
pH: 7.5 (ref 5.0–8.0)

## 2013-04-08 LAB — RAPID URINE DRUG SCREEN, HOSP PERFORMED
AMPHETAMINES: NOT DETECTED
BENZODIAZEPINES: POSITIVE — AB
Barbiturates: NOT DETECTED
Cocaine: NOT DETECTED
Opiates: NOT DETECTED
TETRAHYDROCANNABINOL: NOT DETECTED

## 2013-04-08 LAB — TROPONIN I: TROPONIN I: 0.52 ng/mL — AB (ref <0.30)

## 2013-04-08 LAB — LACTIC ACID, PLASMA: Lactic Acid, Venous: 0.6 mmol/L (ref 0.5–2.2)

## 2013-04-08 MED ORDER — NALOXONE HCL 0.4 MG/ML IJ SOLN
2.0000 mg | Freq: Once | INTRAMUSCULAR | Status: AC
  Administered 2013-04-09: 2 mg via INTRAVENOUS
  Filled 2013-04-08 (×2): qty 5

## 2013-04-08 MED ORDER — ASPIRIN 300 MG RE SUPP
300.0000 mg | Freq: Once | RECTAL | Status: AC
  Administered 2013-04-08: 300 mg via RECTAL
  Filled 2013-04-08: qty 1

## 2013-04-08 MED ORDER — POTASSIUM CHLORIDE 10 MEQ/100ML IV SOLN
10.0000 meq | Freq: Once | INTRAVENOUS | Status: AC
  Administered 2013-04-08: 10 meq via INTRAVENOUS
  Filled 2013-04-08: qty 100

## 2013-04-08 MED ORDER — HYDROCORTISONE NA SUCCINATE PF 250 MG IJ SOLR
250.0000 mg | Freq: Once | INTRAMUSCULAR | Status: AC
  Administered 2013-04-09: 250 mg via INTRAVENOUS
  Filled 2013-04-08: qty 250

## 2013-04-08 MED ORDER — POTASSIUM CHLORIDE 10 MEQ/100ML IV SOLN
10.0000 meq | Freq: Once | INTRAVENOUS | Status: AC
  Administered 2013-04-09: 10 meq via INTRAVENOUS
  Filled 2013-04-08: qty 100

## 2013-04-08 NOTE — ED Notes (Signed)
PT C/O HYPERTENSION. PT VERY LETHARGIC AT TRIAGE. PT WAS BROUGHT IN BY EMS.

## 2013-04-09 ENCOUNTER — Inpatient Hospital Stay (HOSPITAL_COMMUNITY): Payer: Medicare Other

## 2013-04-09 ENCOUNTER — Ambulatory Visit: Payer: Medicare Other | Admitting: Obstetrics and Gynecology

## 2013-04-09 ENCOUNTER — Ambulatory Visit (HOSPITAL_COMMUNITY): Payer: Self-pay | Admitting: Psychiatry

## 2013-04-09 ENCOUNTER — Encounter (HOSPITAL_COMMUNITY): Payer: Self-pay | Admitting: Emergency Medicine

## 2013-04-09 DIAGNOSIS — G9340 Encephalopathy, unspecified: Principal | ICD-10-CM

## 2013-04-09 DIAGNOSIS — R799 Abnormal finding of blood chemistry, unspecified: Secondary | ICD-10-CM

## 2013-04-09 DIAGNOSIS — N179 Acute kidney failure, unspecified: Secondary | ICD-10-CM | POA: Diagnosis present

## 2013-04-09 DIAGNOSIS — R4182 Altered mental status, unspecified: Secondary | ICD-10-CM

## 2013-04-09 DIAGNOSIS — E2749 Other adrenocortical insufficiency: Secondary | ICD-10-CM

## 2013-04-09 DIAGNOSIS — E876 Hypokalemia: Secondary | ICD-10-CM | POA: Diagnosis present

## 2013-04-09 DIAGNOSIS — J449 Chronic obstructive pulmonary disease, unspecified: Secondary | ICD-10-CM

## 2013-04-09 DIAGNOSIS — E872 Acidosis, unspecified: Secondary | ICD-10-CM | POA: Diagnosis not present

## 2013-04-09 LAB — COMPREHENSIVE METABOLIC PANEL
ALBUMIN: 3.1 g/dL — AB (ref 3.5–5.2)
ALK PHOS: 62 U/L (ref 39–117)
ALT: 18 U/L (ref 0–35)
AST: 34 U/L (ref 0–37)
BUN: 10 mg/dL (ref 6–23)
CHLORIDE: 87 meq/L — AB (ref 96–112)
CO2: 25 mEq/L (ref 19–32)
Calcium: 9.1 mg/dL (ref 8.4–10.5)
Creatinine, Ser: 0.88 mg/dL (ref 0.50–1.10)
GFR calc Af Amer: 83 mL/min — ABNORMAL LOW (ref >90)
GFR calc non Af Amer: 72 mL/min — ABNORMAL LOW (ref >90)
Glucose, Bld: 125 mg/dL — ABNORMAL HIGH (ref 70–99)
POTASSIUM: 4.2 meq/L (ref 3.7–5.3)
Sodium: 131 mEq/L — ABNORMAL LOW (ref 137–147)
TOTAL PROTEIN: 6.8 g/dL (ref 6.0–8.3)
Total Bilirubin: 0.3 mg/dL (ref 0.3–1.2)

## 2013-04-09 LAB — BASIC METABOLIC PANEL
BUN: 9 mg/dL (ref 6–23)
CHLORIDE: 88 meq/L — AB (ref 96–112)
CO2: 26 mEq/L (ref 19–32)
Calcium: 9 mg/dL (ref 8.4–10.5)
Creatinine, Ser: 0.8 mg/dL (ref 0.50–1.10)
GFR calc Af Amer: >90 mL/min (ref >90)
GFR, EST NON AFRICAN AMERICAN: 80 mL/min — AB (ref >90)
GLUCOSE: 114 mg/dL — AB (ref 70–99)
POTASSIUM: 4.4 meq/L (ref 3.7–5.3)
Sodium: 130 mEq/L — ABNORMAL LOW (ref 137–147)

## 2013-04-09 LAB — BLOOD GAS, ARTERIAL
ACID-BASE EXCESS: 6.9 mmol/L — AB (ref 0.0–2.0)
Bicarbonate: 31.4 mEq/L — ABNORMAL HIGH (ref 20.0–24.0)
Drawn by: 38235
O2 CONTENT: 2 L/min
O2 Saturation: 93.5 %
PH ART: 7.415 (ref 7.350–7.450)
PO2 ART: 71.3 mmHg — AB (ref 80.0–100.0)
Patient temperature: 37
TCO2: 28.2 mmol/L (ref 0–100)
pCO2 arterial: 50 mmHg — ABNORMAL HIGH (ref 35.0–45.0)

## 2013-04-09 LAB — CBC
HEMATOCRIT: 34.5 % — AB (ref 36.0–46.0)
Hemoglobin: 12 g/dL (ref 12.0–15.0)
MCH: 32.3 pg (ref 26.0–34.0)
MCHC: 34.8 g/dL (ref 30.0–36.0)
MCV: 92.7 fL (ref 78.0–100.0)
PLATELETS: 370 10*3/uL (ref 150–400)
RBC: 3.72 MIL/uL — ABNORMAL LOW (ref 3.87–5.11)
RDW: 13.9 % (ref 11.5–15.5)
WBC: 4.5 10*3/uL (ref 4.0–10.5)

## 2013-04-09 LAB — SALICYLATE LEVEL: Salicylate Lvl: <2 mg/dL — ABNORMAL LOW (ref 2.8–20.0)

## 2013-04-09 LAB — MRSA PCR SCREENING: MRSA by PCR: NEGATIVE

## 2013-04-09 LAB — TROPONIN I
TROPONIN I: 0.48 ng/mL — AB (ref <0.30)
TROPONIN I: 0.47 ng/mL — AB (ref <0.30)

## 2013-04-09 LAB — LACTIC ACID, PLASMA: LACTIC ACID, VENOUS: 0.7 mmol/L (ref 0.5–2.2)

## 2013-04-09 MED ORDER — POTASSIUM CHLORIDE 10 MEQ/100ML IV SOLN
10.0000 meq | INTRAVENOUS | Status: AC
  Administered 2013-04-09 (×3): 10 meq via INTRAVENOUS
  Filled 2013-04-09 (×2): qty 100

## 2013-04-09 MED ORDER — ONDANSETRON HCL 4 MG/2ML IJ SOLN: 4.0000 mg | Freq: Four times a day (QID) | INTRAMUSCULAR | Status: DC | PRN

## 2013-04-09 MED ORDER — ONDANSETRON HCL 4 MG PO TABS: 4.0000 mg | ORAL_TABLET | Freq: Four times a day (QID) | ORAL | Status: DC | PRN

## 2013-04-09 MED ORDER — LORAZEPAM 1 MG PO TABS
1.0000 mg | ORAL_TABLET | Freq: Four times a day (QID) | ORAL | Status: DC | PRN
  Administered 2013-04-11: 1 mg via ORAL
  Filled 2013-04-09: qty 1

## 2013-04-09 MED ORDER — ENOXAPARIN SODIUM 40 MG/0.4ML ~~LOC~~ SOLN
40.0000 mg | Freq: Every day | SUBCUTANEOUS | Status: DC
  Administered 2013-04-09 – 2013-04-14 (×5): 40 mg via SUBCUTANEOUS
  Filled 2013-04-09 (×6): qty 0.4

## 2013-04-09 MED ORDER — HYDROCORTISONE NA SUCCINATE PF 100 MG IJ SOLR
INTRAMUSCULAR | Status: AC
  Filled 2013-04-09: qty 4

## 2013-04-09 MED ORDER — NALOXONE HCL 1 MG/ML IJ SOLN
INTRAMUSCULAR | Status: AC
  Filled 2013-04-09: qty 2

## 2013-04-09 MED ORDER — LORAZEPAM 2 MG/ML IJ SOLN: 1.0000 mg | Freq: Four times a day (QID) | INTRAMUSCULAR | Status: DC | PRN

## 2013-04-09 MED ORDER — SODIUM CHLORIDE 0.9 % IV SOLN
INTRAVENOUS | Status: DC
  Administered 2013-04-09 – 2013-04-10 (×3): via INTRAVENOUS

## 2013-04-09 MED ORDER — HYDROCORTISONE NA SUCCINATE PF 100 MG IJ SOLR
INTRAMUSCULAR | Status: AC
  Filled 2013-04-09: qty 2

## 2013-04-09 MED ORDER — HYDROCORTISONE NA SUCCINATE PF 100 MG IJ SOLR
50.0000 mg | Freq: Three times a day (TID) | INTRAMUSCULAR | Status: DC
  Administered 2013-04-09 (×2): 50 mg via INTRAVENOUS
  Filled 2013-04-09 (×4): qty 1

## 2013-04-09 MED ORDER — HYDROCORTISONE NA SUCCINATE PF 100 MG IJ SOLR
25.0000 mg | Freq: Three times a day (TID) | INTRAMUSCULAR | Status: DC
  Administered 2013-04-09 – 2013-04-11 (×6): 25 mg via INTRAVENOUS
  Filled 2013-04-09 (×8): qty 0.5

## 2013-04-09 NOTE — Progress Notes (Signed)
[facility] PROGRESS NOTE  Tonya Sullivan TJQ:[id number] DOB: [birth year] DOA: 04/08/2013 PCP: Vic Blackbird, MD  Assessment/Plan: Principal Problem:   Acute encephalopathy: Unclear etiology. If it is negative. To be less delirium and may be more longer-term process. Acidosis is also concerning. We'll need to talk to family, I am concerned as patient cannot care for herself. Noted urine drug screen positive for benzos, but not opiates. Have DC'd fentanyl patch Active Problems:   COPD (chronic obstructive pulmonary disease): Looks to be stable. Oxygen levels normal.   Tobacco use   Bipolar disorder: Her antipsychotics could be playing a contributing factor to her confusion   Adrenal cortical insufficiency: On stress dose steroids   Hyponatremia: Sodium for now normal.   Elevated troponin: Second troponin trending downward. Awaiting third set   Hypokalemia   AKI (acute kidney injury): Secondary to dehydration? With hydration, creatinine normalized this morning    Metabolic acidosis: Followup labs this morning noted anion gap of 18. Ordered stat lactic acid level and salicylate level, both of which are pending. Unclear etiology why.  Code Status: For now full code Family Communication: Have left message for family.  Disposition Plan: Continue step down until followup labs done. Metabolic acidosis concerning.   Consultants:  None  Procedures:  None  Antibiotics:  None  HPI/Subjective: Patient doing okay. Denies any headache, vision changes. Denies any chest pain or shortness of breath. Oriented to her name. Does not know where she is and thinks that it is the year 1997  Objective: Filed Vitals:   04/09/13 1130  BP: 124/91  Pulse: 71  Temp:   Resp: 16    Intake/Output Summary (Last 24 hours) at 04/09/13 1349 Last data filed at 04/09/13 1055  Gross per 24 hour  Intake 968.75 ml  Output   1055 ml  Net -86.25 ml   Filed Weights   04/08/13 2122 04/09/13 0430   Weight: 88.905 kg (196 lb) 89.5 kg (197 lb 5 oz)    Exam:   General:  Alert, oriented x1  Cardiovascular: Regular rate and rhythm  Respiratory: Decreased breath sounds throughout  Abdomen: Soft, nontender, nondistended positive bowel sounds  Musculoskeletal: No clubbing or cyanosis, trace edema  Neuro: No focal deficits   Data Reviewed: Basic Metabolic Panel:  Recent Labs Lab 04/08/13 2235 04/09/13 0500  NA 130* 131*  K 2.9* 4.2  CL 83* 87*  CO2 33* 25  GLUCOSE 106* 125*  BUN 10 10  CREATININE 1.28* 0.88  CALCIUM 9.5 9.1   Liver Function Tests:  Recent Labs Lab 04/08/13 2235 04/09/13 0500  AST 27 34  ALT 21 18  ALKPHOS 73 62  BILITOT 0.3 0.3  PROT 7.7 6.8  ALBUMIN 3.7 3.1*   No results found for this basename: LIPASE, AMYLASE,  in the last 168 hours  Recent Labs Lab 04/08/13 2256  AMMONIA 31   CBC:  Recent Labs Lab 04/08/13 2235 04/09/13 0500  WBC 8.1 4.5  NEUTROABS 4.6  --   HGB 12.1 12.0  HCT 35.2* 34.5*  MCV 92.1 92.7  PLT 366 370   Cardiac Enzymes:  Recent Labs Lab 04/08/13 2235 04/09/13 0240  TROPONINI 0.52* 0.48*   BNP (last 3 results)  Recent Labs  08/03/12 0850 10/25/12 0930  PROBNP 745.1* 209.0*   CBG:  Recent Labs Lab 04/08/13 2225  GLUCAP 100*    Recent Results (from the past 240 hour(s))  MRSA PCR SCREENING     Status: None   Collection Time  04/09/13  4:39 AM      Result Value Ref Range Status   MRSA by PCR NEGATIVE  NEGATIVE Final   Comment:            The GeneXpert MRSA Assay (FDA     approved for NASAL specimens     only), is one component of a     comprehensive MRSA colonization     surveillance program. It is not     intended to diagnose MRSA     infection nor to guide or     monitor treatment for     MRSA infections.     Studies: Ct Head Wo Contrast  04/09/2013   CLINICAL DATA:  Lethargy.  EXAM: CT HEAD WITHOUT CONTRAST  TECHNIQUE: Contiguous axial images were obtained from the base of  the skull through the vertex without intravenous contrast.  COMPARISON:  04/01/2013  FINDINGS: Skull and Sinuses:No significant abnormality.  Orbits: No acute abnormality.  Brain: No evidence of acute abnormality, such as acute infarction, hemorrhage, hydrocephalus, or mass lesion/mass effect. Prominent bifrontal extra-axial spaces consistent with cerebral volume loss- age advanced.  IMPRESSION: 1. No acute intracranial abnormality. 2. Brain atrophy.   Electronically Signed   By: Jorje Guild M.D.   On: 04/09/2013 00:32   Dg Chest Port 1 View  04/09/2013   CLINICAL DATA:  Respiratory distress.  EXAM: PORTABLE CHEST - 1 VIEW  COMPARISON:  DG CHEST 1V PORT dated 04/01/2013  FINDINGS: The cardiac silhouette appears mild to moderately enlarged, similar. Mediastinal silhouette is nonsuspicious, unchanged. Stable mild interstitial prominence with strandy densities in right lung base. No pleural effusions. Trachea projects midline and there is no pneumothorax.  ACDF. Multiple EKG lines overlie the patient and may obscure subtle underlying pathology.  IMPRESSION: Stable cardiomegaly and chronic interstitial changes with right lung base strandy densities likely reflecting atelectasis.   Electronically Signed   By: Elon Alas   On: 04/09/2013 02:57    Scheduled Meds: . enoxaparin (LOVENOX) injection  40 mg Subcutaneous Daily  . hydrocortisone sod succinate (SOLU-CORTEF) inj  50 mg Intravenous 3 times per day   Continuous Infusions: . sodium chloride 75 mL/hr at 04/09/13 0440    Principal Problem:   Acute encephalopathy Active Problems:   COPD (chronic obstructive pulmonary disease)   Tobacco use   Bipolar disorder   Adrenal cortical insufficiency   Hyponatremia   Elevated troponin   Hypokalemia   AKI (acute kidney injury)   Metabolic acidosis    Time spent: 35 minutes    Charlina Dwight Wynelle Link  [facility] Pager [phone]. If 7PM-7AM, please contact night-coverage at [email or web address],  password [facility] 04/09/2013, 1:49 PM  LOS: 1 day

## 2013-04-09 NOTE — ED Notes (Signed)
Less responsive than upon admission. Responds to painful stimuli only

## 2013-04-09 NOTE — Progress Notes (Signed)
Utilization review completed. Felicita Nuncio, RN, BSN. 

## 2013-04-09 NOTE — ED Provider Notes (Signed)
CSN: [id number]     Arrival date & time 04/08/13  2104 History   First MD Initiated Contact with Patient 04/08/13 2303     Chief Complaint  Patient presents with  . LETHARGIC      (Consider location/radiation/quality/duration/timing/severity/associated sxs/prior Treatment) The history is provided by the EMS personnel. The history is limited by the condition of the patient (Unresponsive).   58 year old female was brought in by ambulance from nursing home where she was found to be unresponsive. No other history is available. She had recently been discharged from the hospital after an episode of hyponatremia and encephalopathy.  Past Medical History  Diagnosis Date  . COPD (chronic obstructive pulmonary disease)   . Depression   . Chronic back pain     DDD, disc bulge, radiculopathy, spinal stenosis  . Hyperlipidemia   . Anxiety   . Bipolar disorder, unspecified   . Carotid artery calcification   . DDD (degenerative disc disease), lumbosacral   . Spinal stenosis, lumbar   . Thyroid disease   . Autoimmune thyroiditis   . Hypertension   . TIA (transient ischemic attack)     august 2014  . GERD (gastroesophageal reflux disease)   . H/O dizziness   . Neuropathic pain   . Adrenal gland dysfunction     Addison's disease ON DAILY PREDNISONE  . Addison's disease   . Stroke     HX OF MINI STROKE AUG 2014- DROPPIN EVERYTHING, SPEECH SLURRED, MEMORY AFFECTED --ALL RESOLVED - NO DEFICITS NOW  . Pneumonia     LAST PNEUMONIA WAS AUG 2014  . Achalasia   . Mouth ulcers   . Dysrhythmia     palpitations  . Melanoma     removed 1 week ago at Dr Teryl Lucy office   Past Surgical History  Procedure Laterality Date  . Appendectomy    . Cholecystectomy    . Lump left breast      removed- benign  . Abdominal hysterectomy      tubal pregnancy  . Inguinal hernia repair    . Stomach surgery      ?holes in esophgous  . Ectopic pregnancy surgery    . Back surgery      x  5;1984;1989;;1999;2000;2010  . Colonoscopy  01/25/2012    Procedure: COLONOSCOPY;  Surgeon: Rogene Houston, MD;  Location: [facility];  Service: Endoscopy;  Laterality: N/A;  130  . Esophagogastroduodenoscopy (egd) with esophageal dilation N/A 08/13/2012    Procedure: ESOPHAGOGASTRODUODENOSCOPY (EGD) WITH ESOPHAGEAL DILATION;  Surgeon: Rogene Houston, MD;  Location: [facility];  Service: Endoscopy;  Laterality: N/A;  315  . Breast surgery      left breast  . Anterior cervical decomp/discectomy fusion N/A 10/21/2012    Procedure: Cervical Six-Seven Anterior cervical decompression/diskectomy/fusion;  Surgeon: Kristeen Miss, MD;  Location: [facility];  Service: Neurosurgery;  Laterality: N/A;  Cervical Six-Seven Anterior cervical decompression/diskectomy/fusion  . Cataract extraction w/phaco Right 11/25/2012    Procedure: CATARACT EXTRACTION PHACO AND INTRAOCULAR LENS PLACEMENT (IOC);  Surgeon: Elta Guadeloupe T. Gershon Crane, MD;  Location: [facility];  Service: Ophthalmology;  Laterality: Right;  CDE:10.06  . Cataract extraction w/phaco Left 12/09/2012    Procedure: CATARACT EXTRACTION PHACO AND INTRAOCULAR LENS PLACEMENT (IOC);  Surgeon: Elta Guadeloupe T. Gershon Crane, MD;  Location: [facility];  Service: Ophthalmology;  Laterality: Left;  CDE:5.06  . Tonsillectomy    . Hiatal hernia repair    . Heller myotomy N/A 03/17/2013    Procedure: DIAGNOSTIC LAPAROSCOPY, LAPAROSCOPIC HELLER MYOTOMY,  ENDOSCOPY, DOR FUNDOPLICATION;  Surgeon: Ralene Ok, MD;  Location: [facility];  Service: General;  Laterality: N/A;   Family History  Problem Relation Age of Onset  . Hypertension Mother   . Heart disease Mother   . Heart attack Mother   . Anxiety disorder Mother   . Cancer Father     lung cancer  . Heart attack Father   . Alcohol abuse Father   . Hypertension Brother   . Alcohol abuse Brother   . Bipolar disorder Brother   . ADD / ADHD Brother   . Drug abuse Brother   . OCD Brother   . Alcohol abuse Sister   . Bipolar  disorder Sister   . Anxiety disorder Sister   . ADD / ADHD Sister   . Drug abuse Sister   . Physical abuse Sister   . Sexual abuse Sister   . Alcohol abuse Brother   . Bipolar disorder Brother   . ADD / ADHD Brother   . Drug abuse Brother   . Alcohol abuse Sister   . Bipolar disorder Sister   . Anxiety disorder Sister   . ADD / ADHD Sister   . Physical abuse Sister   . Sexual abuse Sister   . Alcohol abuse Brother   . Bipolar disorder Brother   . ADD / ADHD Brother   . Bipolar disorder Brother   . ADD / ADHD Brother   . Alcohol abuse Brother   . Paranoid behavior Brother   . Physical abuse Brother   . Sexual abuse Brother   . Bipolar disorder Maternal Aunt   . Bipolar disorder Paternal Aunt   . Bipolar disorder Maternal Uncle   . Bipolar disorder Paternal Uncle   . Bipolar disorder Maternal Grandfather   . Alcohol abuse Maternal Grandfather   . Bipolar disorder Maternal Grandmother   . Alcohol abuse Maternal Grandmother   . Anxiety disorder Maternal Grandmother   . Dementia Maternal Grandmother   . Bipolar disorder Paternal Grandfather   . Alcohol abuse Paternal Grandfather   . Bipolar disorder Paternal Grandmother   . Alcohol abuse Paternal Grandmother   . Drug abuse Paternal Grandmother   . Bipolar disorder Maternal Uncle   . Schizophrenia Neg Hx   . Seizures Neg Hx    History  Substance Use Topics  . Smoking status: Current Every Day Smoker -- 0.25 packs/day for 40 years    Types: Cigarettes  . Smokeless tobacco: Never Used     Comment: 5 cigs day 03/12/13  . Alcohol Use: No   OB History   Grav Para Term Preterm Abortions TAB SAB Ect Mult Living   2 1 1  1   1  1      Review of Systems  Unable to perform ROS: Patient unresponsive      Allergies  Codeine; Cyclobenzaprine; Darvocet; Abilify; Ace inhibitors; Adhesive; Gabapentin; Iodine; Levofloxacin; Sulfa antibiotics; and Robaxin  Home Medications  No current outpatient prescriptions on file. BP  123/82  Pulse 69  Temp(Src) 96.8 F (36 C) (Axillary)  Resp 6  Ht 5\' 7"  (1.702 m)  Wt 197 lb 5 oz (89.5 kg)  BMI 30.90 kg/m2  SpO2 96% Physical Exam  Nursing note and vitals reviewed.  58 year old female, who is unresponsive. Vital signs are significant for slow respiratory rate of 6. Oxygen saturation is 96%, which is normal. Head is normocephalic and atraumatic. PERRLA, EOMI. Oropharynx is clear. Neck is nontender and supple without adenopathy or  JVD. Back is nontender and there is no CVA tenderness. Lungs are clear without rales, wheezes, or rhonchi. Chest is nontender. Heart has regular rate and rhythm without murmur. Abdomen is soft, flat, nontender without masses or hepatosplenomegaly and peristalsis is normoactive. Extremities have no cyanosis or edema, full range of motion is present. Skin is warm and dry without rash. Neurologic: She is unresponsive even to deep painful stimuli, cranial nerves are intact, there are no gross motor or sensory deficits.  ED Course  Procedures (including critical care time) Labs Review Results for orders placed during the hospital encounter of 04/08/13  MRSA PCR SCREENING      Result Value Ref Range   MRSA by PCR NEGATIVE  NEGATIVE  CBC WITH DIFFERENTIAL      Result Value Ref Range   WBC 8.1  4.0 - 10.5 K/uL   RBC 3.82 (*) 3.87 - 5.11 MIL/uL   Hemoglobin 12.1  12.0 - 15.0 g/dL   HCT 35.2 (*) 36.0 - 46.0 %   MCV 92.1  78.0 - 100.0 fL   MCH 31.7  26.0 - 34.0 pg   MCHC 34.4  30.0 - 36.0 g/dL   RDW 13.6  11.5 - 15.5 %   Platelets 366  150 - 400 K/uL   Neutrophils Relative % 56  43 - 77 %   Neutro Abs 4.6  1.7 - 7.7 K/uL   Lymphocytes Relative 28  12 - 46 %   Lymphs Abs 2.2  0.7 - 4.0 K/uL   Monocytes Relative 13 (*) 3 - 12 %   Monocytes Absolute 1.1 (*) 0.1 - 1.0 K/uL   Eosinophils Relative 3  0 - 5 %   Eosinophils Absolute 0.2  0.0 - 0.7 K/uL   Basophils Relative 0  0 - 1 %   Basophils Absolute 0.0  0.0 - 0.1 K/uL  COMPREHENSIVE  METABOLIC PANEL      Result Value Ref Range   Sodium 130 (*) 137 - 147 mEq/L   Potassium 2.9 (*) 3.7 - 5.3 mEq/L   Chloride 83 (*) 96 - 112 mEq/L   CO2 33 (*) 19 - 32 mEq/L   Glucose, Bld 106 (*) 70 - 99 mg/dL   BUN 10  6 - 23 mg/dL   Creatinine, Ser 1.28 (*) 0.50 - 1.10 mg/dL   Calcium 9.5  8.4 - 10.5 mg/dL   Total Protein 7.7  6.0 - 8.3 g/dL   Albumin 3.7  3.5 - 5.2 g/dL   AST 27  0 - 37 U/L   ALT 21  0 - 35 U/L   Alkaline Phosphatase 73  39 - 117 U/L   Total Bilirubin 0.3  0.3 - 1.2 mg/dL   GFR calc non Af Amer 45 (*) >90 mL/min   GFR calc Af Amer 53 (*) >90 mL/min  URINE RAPID DRUG SCREEN (HOSP PERFORMED)      Result Value Ref Range   Opiates NONE DETECTED  NONE DETECTED   Cocaine NONE DETECTED  NONE DETECTED   Benzodiazepines POSITIVE (*) NONE DETECTED   Amphetamines NONE DETECTED  NONE DETECTED   Tetrahydrocannabinol NONE DETECTED  NONE DETECTED   Barbiturates NONE DETECTED  NONE DETECTED  URINALYSIS, ROUTINE W REFLEX MICROSCOPIC      Result Value Ref Range   Color, Urine YELLOW  YELLOW   APPearance CLEAR  CLEAR   Specific Gravity, Urine 1.010  1.005 - 1.030   pH 7.5  5.0 - 8.0   Glucose, UA NEGATIVE  NEGATIVE mg/dL   Hgb urine dipstick NEGATIVE  NEGATIVE   Bilirubin Urine NEGATIVE  NEGATIVE   Ketones, ur NEGATIVE  NEGATIVE mg/dL   Protein, ur NEGATIVE  NEGATIVE mg/dL   Urobilinogen, UA 1.0  0.0 - 1.0 mg/dL   Nitrite NEGATIVE  NEGATIVE   Leukocytes, UA NEGATIVE  NEGATIVE  TROPONIN I      Result Value Ref Range   Troponin I 0.52 (*) <0.30 ng/mL  AMMONIA      Result Value Ref Range   Ammonia 31  11 - 60 umol/L  LACTIC ACID, PLASMA      Result Value Ref Range   Lactic Acid, Venous 0.6  0.5 - 2.2 mmol/L  BLOOD GAS, ARTERIAL      Result Value Ref Range   O2 Content 2.0     Delivery systems NASAL CANNULA     pH, Arterial 7.415  7.350 - 7.450   pCO2 arterial 50.0 (*) 35.0 - 45.0 mmHg   pO2, Arterial 71.3 (*) 80.0 - 100.0 mmHg   Bicarbonate 31.4 (*) 20.0 - 24.0  mEq/L   TCO2 28.2  0 - 100 mmol/L   Acid-Base Excess 6.9 (*) 0.0 - 2.0 mmol/L   O2 Saturation 93.5     Patient temperature 37.0     Collection site RIGHT RADIAL     Drawn by [phone]     Sample type ARTERIAL     Allens test (pass/fail) PASS  PASS  TROPONIN I      Result Value Ref Range   Troponin I 0.48 (*) <0.30 ng/mL  CBG MONITORING, ED      Result Value Ref Range   Glucose-Capillary 100 (*) 70 - 99 mg/dL   Imaging Review Ct Head Wo Contrast  04/09/2013   CLINICAL DATA:  Lethargy.  EXAM: CT HEAD WITHOUT CONTRAST  TECHNIQUE: Contiguous axial images were obtained from the base of the skull through the vertex without intravenous contrast.  COMPARISON:  04/01/2013  FINDINGS: Skull and Sinuses:No significant abnormality.  Orbits: No acute abnormality.  Brain: No evidence of acute abnormality, such as acute infarction, hemorrhage, hydrocephalus, or mass lesion/mass effect. Prominent bifrontal extra-axial spaces consistent with cerebral volume loss- age advanced.  IMPRESSION: 1. No acute intracranial abnormality. 2. Brain atrophy.   Electronically Signed   By: Jorje Guild M.D.   On: 04/09/2013 00:32   Dg Chest Port 1 View  04/09/2013   CLINICAL DATA:  Respiratory distress.  EXAM: PORTABLE CHEST - 1 VIEW  COMPARISON:  DG CHEST 1V PORT dated 04/01/2013  FINDINGS: The cardiac silhouette appears mild to moderately enlarged, similar. Mediastinal silhouette is nonsuspicious, unchanged. Stable mild interstitial prominence with strandy densities in right lung base. No pleural effusions. Trachea projects midline and there is no pneumothorax.  ACDF. Multiple EKG lines overlie the patient and may obscure subtle underlying pathology.  IMPRESSION: Stable cardiomegaly and chronic interstitial changes with right lung base strandy densities likely reflecting atelectasis.   Electronically Signed   By: Elon Alas   On: 04/09/2013 02:57     EKG Interpretation   Date/Time:  Wednesday April 08 2013 22:26:01  EDT Ventricular Rate:  72 PR Interval:  158 QRS Duration: 86 QT Interval:  408 QTC Calculation: 446 R Axis:   -3 Text Interpretation:  Normal sinus rhythm Normal ECG When compared with  ECG of 01-Apr-2013 03:10, Nonspecific T wave abnormality, improved in  Anterolateral leads Confirmed by [facility]  MD, Jailynne Opperman ([id number]) on 04/08/2013  11:22:40 PM  CRITICAL CARE Performed by: [name] Total critical care time: 85 minutes Critical care time was exclusive of separately billable procedures and treating other patients. Critical care was necessary to treat or prevent imminent or life-threatening deterioration. Critical care was time spent personally by me on the following activities: development of treatment plan with patient and/or surrogate as well as nursing, discussions with consultants, evaluation of patient's response to treatment, examination of patient, obtaining history from patient or surrogate, ordering and performing treatments and interventions, ordering and review of laboratory studies, ordering and review of radiographic studies, pulse oximetry and re-evaluation of patient's condition.  MDM   Final diagnoses:  Altered mental status  Adrenal cortical insufficiency  AKI (acute kidney injury)  Elevated troponin    Altered mental status. Old records are reviewed and she presented in April 1 with virtually identical presentation at which time sodium was very low. Sodium is not nearly as low today but she is noted to be significantly hypokalemic and she is given intravenous potassium replacement. She also had been diagnosed with adrenal insufficiency and she's given a dose of hydrocortisone intravenously with no change. Because of slow respirations, she was given a trial of naloxone with no improvement whatsoever. I had been concerned about her slow respirations and blood gas was obtained showing mild hypercarbia with PCO2 of 50 but normal pH which would argue against acute respiratory  failure. She was maintaining adequate oxygen saturations in spite of slow respiratory rate. This was stable over several hours so was elected not to intubate her. Workup also showed mildly elevated troponin which had been elevated at her recent hospitalization. ECG shows no evidence of acute injury. This is presumably a recurrence of her previous encephalopathy and a definite cause of not been found. Case is discussed with Dr. Darrick Meigs of [facility] who agrees to come and admit the patient. Because of complexity of case, it was decided to transfer her to [facility]. Transfer will be completed by Dr. Darrick Meigs.    [name], MD 00/76/22 [id number]

## 2013-04-09 NOTE — Progress Notes (Signed)
Family states that present state is pt's baseline mental status. She is usually forgetful at home. They believe she needs more supervision at home.

## 2013-04-09 NOTE — H&P (Addendum)
PCP:   Vic Blackbird, MD   Chief Complaint:  Altered mental status  HPI: 58 year old female with a history of adrenal insufficiency, who was recently discharged on 04/02/13 after she was treated for altered mental status of unclear etiology. At that time patient also had elevated troponin, with maximum levels of 0.45, 2-D echo was normal and showed no wall motion abnormality, cardiology was consulted at that time and they recommended no further intervention as they did not suspect ACS.  Today patient was brought to the hospital again with altered mental status, history is unobtainable. In the ED patient is obtunded, has only grimace with sternal rub, respirations 8-12 per minute, maintaining oxygen saturations above 94 percent on 2 L of nasal cannula, ABG showed pH of 7.415, PCO2 50.0, PO2 71.3 Again patient has elevated troponin of 0. 52, EKG shows nonspecific T wave changes in the anterolateral leads which are improved from previous EKG. Patient also received orders of Narcan, with no significant effect on mentation. She also received stress dose  of Solu Cortef in the ED.  Allergies:   Allergies  Allergen Reactions  . Codeine Itching  . Cyclobenzaprine Hives  . Darvocet [Propoxyphene N-Acetaminophen] Rash  . Abilify [Aripiprazole] Other (See Comments)    tremors  . Ace Inhibitors   . Adhesive [Tape] Hives  . Gabapentin Swelling  . Iodine Hives  . Levofloxacin Hives and Hypertension  . Sulfa Antibiotics Itching  . Robaxin [Methocarbamol] Rash      Past Medical History  Diagnosis Date  . COPD (chronic obstructive pulmonary disease)   . Depression   . Chronic back pain     DDD, disc bulge, radiculopathy, spinal stenosis  . Hyperlipidemia   . Anxiety   . Bipolar disorder, unspecified   . Carotid artery calcification   . DDD (degenerative disc disease), lumbosacral   . Spinal stenosis, lumbar   . Thyroid disease   . Autoimmune thyroiditis   . Hypertension   . TIA  (transient ischemic attack)     august 2014  . GERD (gastroesophageal reflux disease)   . H/O dizziness   . Neuropathic pain   . Adrenal gland dysfunction     Addison's disease ON DAILY PREDNISONE  . Addison's disease   . Stroke     HX OF MINI STROKE AUG 2014- DROPPIN EVERYTHING, SPEECH SLURRED, MEMORY AFFECTED --ALL RESOLVED - NO DEFICITS NOW  . Pneumonia     LAST PNEUMONIA WAS AUG 2014  . Achalasia   . Mouth ulcers   . Dysrhythmia     palpitations  . Melanoma     removed 1 week ago at Dr Teryl Lucy office    Past Surgical History  Procedure Laterality Date  . Appendectomy    . Cholecystectomy    . Lump left breast      removed- benign  . Abdominal hysterectomy      tubal pregnancy  . Inguinal hernia repair    . Stomach surgery      ?holes in esophgous  . Ectopic pregnancy surgery    . Back surgery      x 5;1984;1989;;1999;2000;2010  . Colonoscopy  01/25/2012    Procedure: COLONOSCOPY;  Surgeon: Rogene Houston, MD;  Location: [facility];  Service: Endoscopy;  Laterality: N/A;  130  . Esophagogastroduodenoscopy (egd) with esophageal dilation N/A 08/13/2012    Procedure: ESOPHAGOGASTRODUODENOSCOPY (EGD) WITH ESOPHAGEAL DILATION;  Surgeon: Rogene Houston, MD;  Location: [facility];  Service: Endoscopy;  Laterality: N/A;  315  . Breast surgery      left breast  . Anterior cervical decomp/discectomy fusion N/A 10/21/2012    Procedure: Cervical Six-Seven Anterior cervical decompression/diskectomy/fusion;  Surgeon: Kristeen Miss, MD;  Location: [facility];  Service: Neurosurgery;  Laterality: N/A;  Cervical Six-Seven Anterior cervical decompression/diskectomy/fusion  . Cataract extraction w/phaco Right 11/25/2012    Procedure: CATARACT EXTRACTION PHACO AND INTRAOCULAR LENS PLACEMENT (IOC);  Surgeon: Elta Guadeloupe T. Gershon Crane, MD;  Location: [facility];  Service: Ophthalmology;  Laterality: Right;  CDE:10.06  . Cataract extraction w/phaco Left 12/09/2012    Procedure: CATARACT EXTRACTION  PHACO AND INTRAOCULAR LENS PLACEMENT (IOC);  Surgeon: Elta Guadeloupe T. Gershon Crane, MD;  Location: [facility];  Service: Ophthalmology;  Laterality: Left;  CDE:5.06  . Tonsillectomy    . Hiatal hernia repair    . Heller myotomy N/A 03/17/2013    Procedure: DIAGNOSTIC LAPAROSCOPY, LAPAROSCOPIC HELLER MYOTOMY, ENDOSCOPY, DOR FUNDOPLICATION;  Surgeon: Ralene Ok, MD;  Location: [facility];  Service: General;  Laterality: N/A;    Prior to Admission medications   Medication Sig Start Date End Date Taking? Authorizing Provider  albuterol (PROAIR HFA) 108 (90 BASE) MCG/ACT inhaler Inhale 2 puffs into the lungs every 6 (six) hours as needed for wheezing or shortness of breath. 02/27/13   Alycia Rossetti, MD  albuterol (PROVENTIL) (2.5 MG/3ML) 0.083% nebulizer solution Take 3 mLs (2.5 mg total) by nebulization every 4 (four) hours as needed for wheezing. 03/16/13   Alycia Rossetti, MD  amitriptyline (ELAVIL) 100 MG tablet Take 1 tablet (100 mg total) by mouth at bedtime. 02/12/13   Levonne Spiller, MD  anti-nausea (EMETROL) solution Take 10 mLs by mouth every 15 (fifteen) minutes as needed for nausea or vomiting.    Historical Provider, MD  baclofen (LIORESAL) 10 MG tablet Take 1 tablet (10 mg total) by mouth 3 (three) times daily. 03/29/13   Kathie Dike, MD  clonazePAM (KLONOPIN) 1 MG tablet Take 1 tablet (1 mg total) by mouth 4 (four) times daily as needed for anxiety. 04/02/13 04/02/14  Theodis Blaze, MD  DULoxetine (CYMBALTA) 60 MG capsule Take 1 capsule (60 mg total) by mouth every morning. 02/12/13   Levonne Spiller, MD  escitalopram (LEXAPRO) 20 MG tablet Take 1 tablet (20 mg total) by mouth every morning. 02/12/13 02/12/14  Levonne Spiller, MD  fluocinonide gel (LIDEX) 2.13 % Apply 1 application topically 3 (three) times daily. To mouth ulcers 03/11/13   Alycia Rossetti, MD  hydrochlorothiazide (HYDRODIURIL) 25 MG tablet Take 25 mg by mouth every morning. 03/02/13   Historical Provider, MD  lidocaine (LIDODERM) 5 % Place 1 patch  onto the skin daily as needed (pain). Remove & Discard patch within 12 hours or as directed by MD WEARS PATCH ON LOWER BACK FOR BACK PAIN 05/22/12 05/22/13  Darrol Jump, MD  lidocaine (XYLOCAINE) 2 % solution Use as directed 15 mLs in the mouth or throat every 3 (three) hours as needed for mouth pain. 03/11/13   Alycia Rossetti, MD  losartan (COZAAR) 50 MG tablet Take 1 tablet (50 mg total) by mouth every morning. 02/27/13   Alycia Rossetti, MD  ondansetron (ZOFRAN ODT) 4 MG disintegrating tablet Take 1 tablet (4 mg total) by mouth every 4 (four) hours as needed for nausea or vomiting. 03/19/13   Ralene Ok, MD  oxybutynin (DITROPAN XL) 10 MG 24 hr tablet Take 1 tablet (10 mg total) by mouth daily. 02/27/13   Alycia Rossetti, MD  oxyCODONE-acetaminophen [facility]) [phone]  MG per tablet Take 1 tablet by mouth every 8 (eight) hours as needed for pain. 04/02/13   Theodis Blaze, MD  pantoprazole (PROTONIX) 20 MG tablet Take 1 tablet (20 mg total) by mouth 2 (two) times daily. 04/06/13   Ralene Ok, MD  pravastatin (PRAVACHOL) 40 MG tablet Take 1 tablet (40 mg total) by mouth every evening. 02/27/13   Alycia Rossetti, MD  predniSONE (DELTASONE) 2.5 MG tablet Take 3 tablets (7.5 mg total) by mouth daily with breakfast. 02/27/13   Alycia Rossetti, MD  propranolol (INDERAL) 10 MG tablet Take 1 tablet (10 mg total) by mouth 3 (three) times daily. 03/19/13   Levonne Spiller, MD  tetrahydrozoline 0.05 % ophthalmic solution Place 1 drop into both eyes 2 (two) times daily as needed (dry eyes).    Historical Provider, MD  tiotropium (SPIRIVA) 18 MCG inhalation capsule Place 1 capsule (18 mcg total) into inhaler and inhale daily. USES IN THE EVENING 02/27/13   Alycia Rossetti, MD    Social History:  reports that she has been smoking Cigarettes.  She has a 10 pack-year smoking history. She has never used smokeless tobacco. She reports that she does not drink alcohol or use illicit drugs.  Family History  Problem  Relation Age of Onset  . Hypertension Mother   . Heart disease Mother   . Heart attack Mother   . Anxiety disorder Mother   . Cancer Father     lung cancer  . Heart attack Father   . Alcohol abuse Father   . Hypertension Brother   . Alcohol abuse Brother   . Bipolar disorder Brother   . ADD / ADHD Brother   . Drug abuse Brother   . OCD Brother   . Alcohol abuse Sister   . Bipolar disorder Sister   . Anxiety disorder Sister   . ADD / ADHD Sister   . Drug abuse Sister   . Physical abuse Sister   . Sexual abuse Sister   . Alcohol abuse Brother   . Bipolar disorder Brother   . ADD / ADHD Brother   . Drug abuse Brother   . Alcohol abuse Sister   . Bipolar disorder Sister   . Anxiety disorder Sister   . ADD / ADHD Sister   . Physical abuse Sister   . Sexual abuse Sister   . Alcohol abuse Brother   . Bipolar disorder Brother   . ADD / ADHD Brother   . Bipolar disorder Brother   . ADD / ADHD Brother   . Alcohol abuse Brother   . Paranoid behavior Brother   . Physical abuse Brother   . Sexual abuse Brother   . Bipolar disorder Maternal Aunt   . Bipolar disorder Paternal Aunt   . Bipolar disorder Maternal Uncle   . Bipolar disorder Paternal Uncle   . Bipolar disorder Maternal Grandfather   . Alcohol abuse Maternal Grandfather   . Bipolar disorder Maternal Grandmother   . Alcohol abuse Maternal Grandmother   . Anxiety disorder Maternal Grandmother   . Dementia Maternal Grandmother   . Bipolar disorder Paternal Grandfather   . Alcohol abuse Paternal Grandfather   . Bipolar disorder Paternal Grandmother   . Alcohol abuse Paternal Grandmother   . Drug abuse Paternal Grandmother   . Bipolar disorder Maternal Uncle   . Schizophrenia Neg Hx   . Seizures Neg Hx      All the positives are listed in BOLD  Review of  Systems:  Unobtainable at this time  Physical Exam: Blood pressure 126/91, pulse 67, temperature 99.7 F (37.6 C), temperature source Rectal, resp. rate 7,  height 5' 7"  (1.702 m), weight 88.905 kg (196 lb), SpO2 94.00%. Constitutional:   Patient is a well-developed and well-nourished female, obtunded and unable to cooperate with examination Head: Normocephalic and atraumatic Mouth: Mucus membranes moist Neck: Supple, No Thyromegaly Cardiovascular: RRR, S1 normal, S2 normal Pulmonary/Chest: CTAB, no wheezes, rales, or rhonchi Abdominal: Soft. Non-tender, non-distended, bowel sounds are normal, no masses, organomegaly, or guarding present.  Neurological: Obtunded .  Extremities : No Cyanosis, Clubbing or Edema   Labs on Admission:  Results for orders placed during the hospital encounter of 04/08/13 (from the past 48 hour(s))  CBG MONITORING, ED     Status: Abnormal   Collection Time    04/08/13 10:25 PM      Result Value Ref Range   Glucose-Capillary 100 (*) 70 - 99 mg/dL  URINE RAPID DRUG SCREEN (HOSP PERFORMED)     Status: Abnormal   Collection Time    04/08/13 10:30 PM      Result Value Ref Range   Opiates NONE DETECTED  NONE DETECTED   Cocaine NONE DETECTED  NONE DETECTED   Benzodiazepines POSITIVE (*) NONE DETECTED   Amphetamines NONE DETECTED  NONE DETECTED   Tetrahydrocannabinol NONE DETECTED  NONE DETECTED   Barbiturates NONE DETECTED  NONE DETECTED   Comment:            DRUG SCREEN FOR MEDICAL PURPOSES     ONLY.  IF CONFIRMATION IS NEEDED     FOR ANY PURPOSE, NOTIFY LAB     WITHIN 5 DAYS.                LOWEST DETECTABLE LIMITS     FOR URINE DRUG SCREEN     Drug Class       Cutoff (ng/mL)     Amphetamine      1000     Barbiturate      200     Benzodiazepine   532     Tricyclics       992     Opiates          300     Cocaine          300     THC              50  URINALYSIS, ROUTINE W REFLEX MICROSCOPIC     Status: None   Collection Time    04/08/13 10:30 PM      Result Value Ref Range   Color, Urine YELLOW  YELLOW   APPearance CLEAR  CLEAR   Specific Gravity, Urine 1.010  1.005 - 1.030   pH 7.5  5.0 - 8.0    Glucose, UA NEGATIVE  NEGATIVE mg/dL   Hgb urine dipstick NEGATIVE  NEGATIVE   Bilirubin Urine NEGATIVE  NEGATIVE   Ketones, ur NEGATIVE  NEGATIVE mg/dL   Protein, ur NEGATIVE  NEGATIVE mg/dL   Urobilinogen, UA 1.0  0.0 - 1.0 mg/dL   Nitrite NEGATIVE  NEGATIVE   Leukocytes, UA NEGATIVE  NEGATIVE   Comment: MICROSCOPIC NOT DONE ON URINES WITH NEGATIVE PROTEIN, BLOOD, LEUKOCYTES, NITRITE, OR GLUCOSE <1000 mg/dL.  CBC WITH DIFFERENTIAL     Status: Abnormal   Collection Time    04/08/13 10:35 PM      Result Value Ref Range   WBC 8.1  4.0 - 10.5  K/uL   RBC 3.82 (*) 3.87 - 5.11 MIL/uL   Hemoglobin 12.1  12.0 - 15.0 g/dL   HCT 35.2 (*) 36.0 - 46.0 %   MCV 92.1  78.0 - 100.0 fL   MCH 31.7  26.0 - 34.0 pg   MCHC 34.4  30.0 - 36.0 g/dL   RDW 13.6  11.5 - 15.5 %   Platelets 366  150 - 400 K/uL   Neutrophils Relative % 56  43 - 77 %   Neutro Abs 4.6  1.7 - 7.7 K/uL   Lymphocytes Relative 28  12 - 46 %   Lymphs Abs 2.2  0.7 - 4.0 K/uL   Monocytes Relative 13 (*) 3 - 12 %   Monocytes Absolute 1.1 (*) 0.1 - 1.0 K/uL   Eosinophils Relative 3  0 - 5 %   Eosinophils Absolute 0.2  0.0 - 0.7 K/uL   Basophils Relative 0  0 - 1 %   Basophils Absolute 0.0  0.0 - 0.1 K/uL  COMPREHENSIVE METABOLIC PANEL     Status: Abnormal   Collection Time    04/08/13 10:35 PM      Result Value Ref Range   Sodium 130 (*) 137 - 147 mEq/L   Potassium 2.9 (*) 3.7 - 5.3 mEq/L   Comment: CRITICAL RESULT CALLED TO, READ BACK BY AND VERIFIED WITH:     WINN T AT [id number] ON [id number] BY FORSYTH K   Chloride 83 (*) 96 - 112 mEq/L   CO2 33 (*) 19 - 32 mEq/L   Glucose, Bld 106 (*) 70 - 99 mg/dL   BUN 10  6 - 23 mg/dL   Creatinine, Ser 1.28 (*) 0.50 - 1.10 mg/dL   Calcium 9.5  8.4 - 10.5 mg/dL   Total Protein 7.7  6.0 - 8.3 g/dL   Albumin 3.7  3.5 - 5.2 g/dL   AST 27  0 - 37 U/L   ALT 21  0 - 35 U/L   Alkaline Phosphatase 73  39 - 117 U/L   Total Bilirubin 0.3  0.3 - 1.2 mg/dL   GFR calc non Af Amer 45 (*) >90 mL/min    GFR calc Af Amer 53 (*) >90 mL/min   Comment: (NOTE)     The eGFR has been calculated using the CKD EPI equation.     This calculation has not been validated in all clinical situations.     eGFR's persistently <90 mL/min signify possible Chronic Kidney     Disease.  TROPONIN I     Status: Abnormal   Collection Time    04/08/13 10:35 PM      Result Value Ref Range   Troponin I 0.52 (*) <0.30 ng/mL   Comment:            Due to the release kinetics of cTnI,     a negative result within the first hours     of the onset of symptoms does not rule out     myocardial infarction with certainty.     If myocardial infarction is still suspected,     repeat the test at appropriate intervals.     CRITICAL RESULT CALLED TO, READ BACK BY AND VERIFIED WITH:     MOORE S AT 2309 ON [id number] BY FORSYTH K  AMMONIA     Status: None   Collection Time    04/08/13 10:56 PM      Result Value Ref Range   Ammonia 31  11 -  60 umol/L  LACTIC ACID, PLASMA     Status: None   Collection Time    04/08/13 10:56 PM      Result Value Ref Range   Lactic Acid, Venous 0.6  0.5 - 2.2 mmol/L  BLOOD GAS, ARTERIAL     Status: Abnormal   Collection Time    04/09/13 12:53 AM      Result Value Ref Range   O2 Content 2.0     Delivery systems NASAL CANNULA     pH, Arterial 7.415  7.350 - 7.450   pCO2 arterial 50.0 (*) 35.0 - 45.0 mmHg   pO2, Arterial 71.3 (*) 80.0 - 100.0 mmHg   Bicarbonate 31.4 (*) 20.0 - 24.0 mEq/L   TCO2 28.2  0 - 100 mmol/L   Acid-Base Excess 6.9 (*) 0.0 - 2.0 mmol/L   O2 Saturation 93.5     Patient temperature 37.0     Collection site RIGHT RADIAL     Drawn by [phone]     Sample type ARTERIAL     Allens test (pass/fail) PASS  PASS    Radiological Exams on Admission: Ct Head Wo Contrast  04/09/2013   CLINICAL DATA:  Lethargy.  EXAM: CT HEAD WITHOUT CONTRAST  TECHNIQUE: Contiguous axial images were obtained from the base of the skull through the vertex without intravenous contrast.  COMPARISON:   04/01/2013  FINDINGS: Skull and Sinuses:No significant abnormality.  Orbits: No acute abnormality.  Brain: No evidence of acute abnormality, such as acute infarction, hemorrhage, hydrocephalus, or mass lesion/mass effect. Prominent bifrontal extra-axial spaces consistent with cerebral volume loss- age advanced.  IMPRESSION: 1. No acute intracranial abnormality. 2. Brain atrophy.   Electronically Signed   By: Jorje Guild M.D.   On: 04/09/2013 00:32    Assessment/Plan Principal Problem:   Altered mental status Active Problems:   Adrenal cortical insufficiency   Hyponatremia   Elevated troponin   Hypokalemia   AKI (acute kidney injury)  Altered mental status Urine analysis is normal Likely due to polypharmacy, patient is on Elavil 100 mg daily, baclofen 1 tablet twice a day, clonazepam 1 mg 4 times a day as needed, Cymbalta 60 mg daily, Lexapro 20 mg daily, Percocet one tablet every 8 hours as needed. Will hold all the by mouth meds at this time. Patient had MRI brain done on 2nd  April 15, which did not show stroke. She was seen by neurology in the previous admission, If patient does not improve consider calling neurology consultation in a.m. Patient might require intubation and mechanical ventilation if she does not improve in next few hours.  Elevated troponin Patient has mild elevation of troponin, which seems persistent as it was elevated in previous admission a week ago. I've called and discussed with cardiology fellow Dr. Rosalee Kaufman on call at [facility] hospital, he does not recommend Lovenox as he does not think that patient is having ACS. We'll cycle cardiac enzymes every 6 hours, call cardiology again if Troponin has significant elevation. Patient has been given aspirin suppository in the ED.  Adrenal insufficiency Patient has been taking prednisone at home, will start her on Solu-Cortef 50 mg IV every 8 hours She has been given stress does of 250 mg Solu-Cortef IV x1 by the ED  physician  Hypokalemia Will replace the potassium and follow labs in a.m.  Hyponatremia Hold hydrochlorothiazide, check BMP in a.m. Likely due to dehydration  Acute kidney injury Patient's creatinine is elevated, we'll continue with IV fluids and follow labs in  the a.m.  Code status: Presumed full code  Family discussion: No family at bedside   Time Spent on Admission: 75 minutes  [facility] Pager: [phone] 04/09/2013, 2:10 AM  If 7PM-7AM, please contact night-coverage  [email or web address]  Password [email or web address]

## 2013-04-09 NOTE — Telephone Encounter (Signed)
Refill appropriate and filled per protocol. 

## 2013-04-09 NOTE — Progress Notes (Signed)
Called lab for a 3rd time about labs being collected. They stated lab tech is on the way.

## 2013-04-10 ENCOUNTER — Telehealth (INDEPENDENT_AMBULATORY_CARE_PROVIDER_SITE_OTHER): Payer: Self-pay

## 2013-04-10 DIAGNOSIS — F329 Major depressive disorder, single episode, unspecified: Secondary | ICD-10-CM

## 2013-04-10 DIAGNOSIS — F3289 Other specified depressive episodes: Secondary | ICD-10-CM

## 2013-04-10 LAB — AMMONIA: AMMONIA: 21 umol/L (ref 11–60)

## 2013-04-10 LAB — COMPREHENSIVE METABOLIC PANEL
ALT: 16 U/L (ref 0–35)
AST: 19 U/L (ref 0–37)
Albumin: 3.1 g/dL — ABNORMAL LOW (ref 3.5–5.2)
Alkaline Phosphatase: 63 U/L (ref 39–117)
BILIRUBIN TOTAL: 0.3 mg/dL (ref 0.3–1.2)
BUN: 8 mg/dL (ref 6–23)
CO2: 28 meq/L (ref 19–32)
CREATININE: 0.73 mg/dL (ref 0.50–1.10)
Calcium: 9 mg/dL (ref 8.4–10.5)
Chloride: 90 mEq/L — ABNORMAL LOW (ref 96–112)
GFR calc Af Amer: >90 mL/min (ref >90)
Glucose, Bld: 92 mg/dL (ref 70–99)
POTASSIUM: 2.9 meq/L — AB (ref 3.7–5.3)
Sodium: 134 mEq/L — ABNORMAL LOW (ref 137–147)
Total Protein: 6.7 g/dL (ref 6.0–8.3)

## 2013-04-10 LAB — CBC
HCT: 34.8 % — ABNORMAL LOW (ref 36.0–46.0)
Hemoglobin: 12 g/dL (ref 12.0–15.0)
MCH: 31.7 pg (ref 26.0–34.0)
MCHC: 34.5 g/dL (ref 30.0–36.0)
MCV: 92.1 fL (ref 78.0–100.0)
PLATELETS: 369 10*3/uL (ref 150–400)
RBC: 3.78 MIL/uL — AB (ref 3.87–5.11)
RDW: 13.5 % (ref 11.5–15.5)
WBC: 9.1 10*3/uL (ref 4.0–10.5)

## 2013-04-10 LAB — TROPONIN I
Troponin I: 0.5 ng/mL (ref <0.30)
Troponin I: 0.48 ng/mL (ref <0.30)

## 2013-04-10 MED ORDER — CHLORHEXIDINE GLUCONATE 0.12 % MT SOLN
15.0000 mL | Freq: Two times a day (BID) | OROMUCOSAL | Status: DC
Start: 2013-04-11 — End: 2013-04-14
  Administered 2013-04-11 – 2013-04-14 (×7): 15 mL via OROMUCOSAL
  Filled 2013-04-10 (×9): qty 15

## 2013-04-10 MED ORDER — AMITRIPTYLINE HCL 100 MG PO TABS
100.0000 mg | ORAL_TABLET | Freq: Every day | ORAL | Status: DC
  Filled 2013-04-10 (×2): qty 1

## 2013-04-10 MED ORDER — POTASSIUM CHLORIDE 10 MEQ/100ML IV SOLN
10.0000 meq | INTRAVENOUS | Status: AC
  Administered 2013-04-10 (×4): 10 meq via INTRAVENOUS
  Filled 2013-04-10 (×2): qty 100

## 2013-04-10 MED ORDER — BIOTENE DRY MOUTH MT LIQD
15.0000 mL | Freq: Two times a day (BID) | OROMUCOSAL | Status: DC
  Administered 2013-04-11 – 2013-04-14 (×8): 15 mL via OROMUCOSAL

## 2013-04-10 MED ORDER — DULOXETINE HCL 60 MG PO CPEP
60.0000 mg | ORAL_CAPSULE | Freq: Every morning | ORAL | Status: DC
  Filled 2013-04-10 (×2): qty 1

## 2013-04-10 MED ORDER — ESCITALOPRAM OXALATE 20 MG PO TABS
20.0000 mg | ORAL_TABLET | Freq: Every day | ORAL | Status: DC
  Filled 2013-04-10 (×2): qty 1

## 2013-04-10 MED ORDER — METOPROLOL TARTRATE 1 MG/ML IV SOLN
5.0000 mg | Freq: Four times a day (QID) | INTRAVENOUS | Status: DC
Start: 2013-04-10 — End: 2013-04-12
  Administered 2013-04-10 – 2013-04-12 (×6): 5 mg via INTRAVENOUS
  Filled 2013-04-10 (×8): qty 5

## 2013-04-10 MED ORDER — PANTOPRAZOLE SODIUM 40 MG PO TBEC
40.0000 mg | DELAYED_RELEASE_TABLET | Freq: Every day | ORAL | Status: DC
Start: 2013-04-10 — End: 2013-04-10

## 2013-04-10 NOTE — Telephone Encounter (Signed)
Incoming call from [facility] regarding patient's rx for Pantoprazole 20 mg tablet.  Advised that her plan does not cover 20 mg two times daily, they will pay for 40 mg once daily.  Advised that I will page Dr. Rosendo Gros and change medication if approved per Physician.  Spoke to Dr. Rosendo Gros and verbal okay to change Pantoprazole to 40mg  once daily.  I will send new rx for Pantoprazole 40mg , once daily tablet, 30 day supply to patient's pharmacy.

## 2013-04-10 NOTE — Telephone Encounter (Signed)
Tonya Sullivan from [place] called for prior authorization for her protonix. She asked if pt tried the 20 mg prior to being prescribed the 40 mg. I do not see documentation in her chart stating she has. She also asked if she tried the 4 mg daily instead of BID. Again no documentation in chart. She will send info to review and fax over results of authorization info.

## 2013-04-10 NOTE — Evaluation (Signed)
Physical Therapy Evaluation Patient Details Name: Tonya Sullivan MRN: [id number] DOB: 11/02/1955 Today's Date: 04/10/2013   History of Present Illness  58 year old female with a history of adrenal insufficiency, who was recently discharged on 04/02/13 after she was treated for altered mental status of unclear etiology.  Readmitted with acute encephalopathy.  Hx includes COPD, bipolar disorder (antipsychotics could be playing a contributing factor to her confusion per MD notes), adrenal cortical insufficiency, hyponatremia, elevated troponin.    Clinical Impression  Pt mobility difficult to assess due to impaired cognition. Pt unable to have intelligible conversation and was inconsistent in command follow. Pt currently is unsafe to live at home alone due to inability to care for self at this time. Unclear of patient's true cognitive baseline. Pt recently at [facility], per physical therapy notes from that stay pt was functioning at a supervision level. Pt currently requires assist for safe mobility and all ADLs. Recommend ST-SNF upon d/c to maximize functional recovery.    Follow Up Recommendations SNF;Supervision/Assistance - 24 hour    Equipment Recommendations  None recommended by PT    Recommendations for Other Services       Precautions / Restrictions Precautions Precautions: Fall Restrictions Weight Bearing Restrictions: No      Mobility  Bed Mobility Overal bed mobility: Needs Assistance Bed Mobility: Supine to Sit     Supine to sit: Max assist;+2 for physical assistance     General bed mobility comments: pt with no initiation, unclear if pt comprehended task asked  Transfers Overall transfer level: Needs assistance Equipment used: 2 person hand held assist Transfers: Sit to/from Stand Sit to Stand: Mod assist;+2 physical assistance         General transfer comment: pt required modAx2 tactile cues to initiate anterior weight shift. once patient stood, pt  repeated sit to stand x 5 without cueing in perseverating manor  Ambulation/Gait Ambulation/Gait assistance:  (not appropriate at this time)              Financial trader Rankin (Stroke Patients Only)       Balance Overall balance assessment: Needs assistance Sitting-balance support: Feet supported Sitting balance-Leahy Scale: Poor Sitting balance - Comments: pt initially retropulsive requiring total assist to maintain sitting balance and then transitioned to min guard   Standing balance support: Bilateral upper extremity supported Standing balance-Leahy Scale: Poor Standing balance comment: pt requires assist to maintain standing                             Pertinent Vitals/Pain Did not report    Home Living Family/patient expects to be discharged to:: Unsure Living Arrangements: Alone Available Help at Discharge: Family;Available PRN/intermittently                  Prior Function Level of Independence: Independent         Comments: per report from MD pt living alone     Hand Dominance        Extremity/Trunk Assessment   Upper Extremity Assessment: Difficult to assess due to impaired cognition (pt held UEs in rigid posture but then with tactile cues pt able to use UE functionally           Lower Extremity Assessment: Difficult to assess due to impaired cognition (pt able to complete standing)      Cervical / Trunk  Assessment: Normal  Communication   Communication: Other (comment) (perseverated on words, unintelligble speech)  Cognition Arousal/Alertness: Awake/alert Behavior During Therapy: Flat affect Overall Cognitive Status: Impaired/Different from baseline (however per RN, family reports this to be baseline) Area of Impairment: Attention;Following commands;Safety/judgement;Awareness   Current Attention Level: Sustained   Following Commands: Follows one step commands inconsistently  (required tactile cues) Safety/Judgement: Decreased awareness of safety;Decreased awareness of deficits     General Comments: pt with h/o pyschosis, unsure of true baseline, pt able to repeat words when asked but then perseverated on words ie. "5,5,5,5,5"    General Comments      Exercises        Assessment/Plan    PT Assessment Patient needs continued PT services  PT Diagnosis     PT Problem List Decreased strength;Decreased activity tolerance  PT Treatment Interventions DME instruction;Gait training;Functional mobility training;Therapeutic activities;Therapeutic exercise   PT Goals (Current goals can be found in the Care Plan section) Acute Rehab PT Goals PT Goal Formulation: Patient unable to participate in goal setting Time For Goal Achievement: 04/24/13 Potential to Achieve Goals: Fair    Frequency Min 3X/week   Barriers to discharge Decreased caregiver support lives alone    Co-evaluation PT/OT/SLP Co-Evaluation/Treatment: Yes Reason for Co-Treatment: Necessary to address cognition/behavior during functional activity;Complexity of the patient's impairments (multi-system involvement) PT goals addressed during session: Mobility/safety with mobility         End of Session Equipment Utilized During Treatment: Gait belt Activity Tolerance:  (limited by impaired cognition) Patient left: in bed;with call bell/phone within reach;with bed alarm set Nurse Communication: Mobility status         Time: 6144-3154 PT Time Calculation (min): 13 min   Charges:   PT Evaluation $Initial PT Evaluation Tier I: 1 Procedure     PT G Codes:          Tonya Sullivan M Cohen Doleman 04/10/2013, 1:19 PM  Tonya Sullivan, PT, DPT Pager #: [phone] Office #: [phone]

## 2013-04-10 NOTE — Evaluation (Signed)
Clinical/Bedside Swallow Evaluation Patient Details  Name: Tonya Sullivan MRN: [id number] Date of Birth: Aug 09, 1955  Today's Date: 04/10/2013 Time: 1856-3149 SLP Time Calculation (min): 8 min; co-eval with PT  Past Medical History:  Past Medical History  Diagnosis Date  . COPD (chronic obstructive pulmonary disease)   . Depression   . Chronic back pain     DDD, disc bulge, radiculopathy, spinal stenosis  . Hyperlipidemia   . Anxiety   . Bipolar disorder, unspecified   . Carotid artery calcification   . DDD (degenerative disc disease), lumbosacral   . Spinal stenosis, lumbar   . Thyroid disease   . Autoimmune thyroiditis   . Hypertension   . TIA (transient ischemic attack)     august 2014  . GERD (gastroesophageal reflux disease)   . H/O dizziness   . Neuropathic pain   . Adrenal gland dysfunction     Addison's disease ON DAILY PREDNISONE  . Addison's disease   . Stroke     HX OF MINI STROKE AUG 2014- DROPPIN EVERYTHING, SPEECH SLURRED, MEMORY AFFECTED --ALL RESOLVED - NO DEFICITS NOW  . Pneumonia     LAST PNEUMONIA WAS AUG 2014  . Achalasia   . Mouth ulcers   . Dysrhythmia     palpitations  . Melanoma     removed 1 week ago at Dr Teryl Lucy office   Past Surgical History:  Past Surgical History  Procedure Laterality Date  . Appendectomy    . Cholecystectomy    . Lump left breast      removed- benign  . Abdominal hysterectomy      tubal pregnancy  . Inguinal hernia repair    . Stomach surgery      ?holes in esophgous  . Ectopic pregnancy surgery    . Back surgery      x 5;1984;1989;;1999;2000;2010  . Colonoscopy  01/25/2012    Procedure: COLONOSCOPY;  Surgeon: Rogene Houston, MD;  Location: [facility];  Service: Endoscopy;  Laterality: N/A;  130  . Esophagogastroduodenoscopy (egd) with esophageal dilation N/A 08/13/2012    Procedure: ESOPHAGOGASTRODUODENOSCOPY (EGD) WITH ESOPHAGEAL DILATION;  Surgeon: Rogene Houston, MD;  Location: [facility];  Service:  Endoscopy;  Laterality: N/A;  315  . Breast surgery      left breast  . Anterior cervical decomp/discectomy fusion N/A 10/21/2012    Procedure: Cervical Six-Seven Anterior cervical decompression/diskectomy/fusion;  Surgeon: Kristeen Miss, MD;  Location: [facility];  Service: Neurosurgery;  Laterality: N/A;  Cervical Six-Seven Anterior cervical decompression/diskectomy/fusion  . Cataract extraction w/phaco Right 11/25/2012    Procedure: CATARACT EXTRACTION PHACO AND INTRAOCULAR LENS PLACEMENT (IOC);  Surgeon: Elta Guadeloupe T. Gershon Crane, MD;  Location: [facility];  Service: Ophthalmology;  Laterality: Right;  CDE:10.06  . Cataract extraction w/phaco Left 12/09/2012    Procedure: CATARACT EXTRACTION PHACO AND INTRAOCULAR LENS PLACEMENT (IOC);  Surgeon: Elta Guadeloupe T. Gershon Crane, MD;  Location: [facility];  Service: Ophthalmology;  Laterality: Left;  CDE:5.06  . Tonsillectomy    . Hiatal hernia repair    . Heller myotomy N/A 03/17/2013    Procedure: DIAGNOSTIC LAPAROSCOPY, LAPAROSCOPIC HELLER MYOTOMY, ENDOSCOPY, DOR FUNDOPLICATION;  Surgeon: Ralene Ok, MD;  Location: [facility];  Service: General;  Laterality: N/A;   HPI:  58 year old female with a history of adrenal insufficiency, who was recently discharged on 04/02/13 after she was treated for altered mental status of unclear etiology.  Readmitted with acute encephalopathy.  Hx includes COPD, bipolar disorder (antipsychotics could be playing a contributing factor  to her confusion per MD notes), adrenal cortical insufficiency, hyponatremia, elevated troponin.  Pt has had swallow assessments in the past, usually due to changes in mental status.  Relevant hx includes stroke, GERD, achalasia, and ACDF.    Assessment / Plan / Recommendation Clinical Impression  Pt's mental status prohibits safe eating at this time.  Presents with repetitive speech (palilalia and echolalia) and motor behaviors (repetitive waving, swallowing, throat-clearing).  She was able intermittently to recognize  liquids and jello, initiate a swallow, and complete it without s/s of aspiration.  At other times, she held the materials orally and then expectorated them.  Recommend NPO until MS improves; no formal SLP f/u is warranted - pt does not have a true dysphagia - RN to make judgements at bedside whether pt is adequately alert for eating.         Diet Recommendation NPO except meds   Medication Administration: Crushed with puree    Other  Recommendations     Follow Up Recommendations  None    SLP Swallow Goals     Swallow Study Prior Functional Status       General Date of Onset: 04/08/13  Type of Study: Bedside swallow evaluation Previous Swallow Assessment: 4/1 clinical swallow evaluation; hx MBS Diet Prior to this Study: NPO Temperature Spikes Noted: No Respiratory Status: Room air Behavior/Cognition: Alert Oral Cavity - Dentition: Edentulous Self-Feeding Abilities: Total assist Patient Positioning: Upright in bed Baseline Vocal Quality: Clear Volitional Cough: Strong Volitional Swallow: Able to elicit    Oral/Motor/Sensory Function Overall Oral Motor/Sensory Function: Appears within functional limits for tasks assessed   Ice Chips Ice chips: Not tested   Thin Liquid Thin Liquid: Impaired Presentation: Cup Oral Phase Impairments: Poor awareness of bolus Oral Phase Functional Implications: Oral holding    Nectar Thick Nectar Thick Liquid: Not tested   Honey Thick Honey Thick Liquid: Not tested   Puree Puree: Impaired Presentation: Spoon Oral Phase Impairments: Poor awareness of bolus Oral Phase Functional Implications: Oral holding   Solid  [name] L. [place], [place] CCC/SLP Pager [phone]     Solid: Not tested       Frederich Balding Drinda Belgard 04/10/2013,10:12 AM

## 2013-04-10 NOTE — Progress Notes (Signed)
[facility] PROGRESS NOTE  [name] RJJ:[id number] DOB: 1955/10/10 DOA: 04/08/2013 PCP: Vic Blackbird, MD  Assessment/Plan: Principal Problem:   Acute encephalopathy: Unclear etiology. Given the overall negative labs, imaging and drug screen, her presentation may be more consistent with mental health issues possibly mania versus schizophrenia. Have restarted her home medications for depression and have asked psychiatry to see. Noted urine drug screen positive for benzos, but not opiates. Have DC'd fentanyl patch.  At this time, I do not think the patient is able to care for herself in the short or long-term setting. Have spoken with the patient's mother who tells me that patient is often forgetful, but on a good day, she is able to interact well but she does have chronic issues and often talks to herself. At this time, patient is too confused has a swallow evaluation and will remain n.p.o.  Active Problems:   COPD (chronic obstructive pulmonary disease): Looks to be stable. Oxygen levels normal.   Tobacco use: Patient not on patch   Bipolar disorder: ? Diagnosis. Patient has been following with a psychiatrist as outpatient. Her mother states that she does have depression and suspect that she likely has bipolar as that runs in the family   Adrenal cortical insufficiency: On stress dose steroids, which we are tapering down   Hyponatremia: Sodium for now normal.   Elevated troponin: Minimal elevation, which may be more related to chronic lung issues. We'll continue to cycle enzymes   Hypokalemia   AKI (acute kidney injury): Secondary to dehydration? With hydration, creatinine normalized this morning    Metabolic acidosis: Followup labs on 4/9 noted anion gap of 18. Ordered stat lactic acid level and salicylate level, both of which are normal. Repeat labs on 4/10 at 16  Code Status:  full code Family Communication: Spoke with mother by phone  Disposition Plan: Stable and so will  transfer to [facility] floor   Consultants:  Psychiatry  Procedures:  None  Antibiotics:  None  HPI/Subjective: Patient more alert today. However, appears to be acting out versus his psychosis. She does not directly answer questions, but instead will choose a word and repeated over and over with [place]-like motions  Objective: Filed Vitals:   04/10/13 1113  BP: 158/86  Pulse: 95  Temp: 98.4 F (36.9 C)  Resp: 17    Intake/Output Summary (Last 24 hours) at 04/10/13 1151 Last data filed at 04/10/13 1100  Gross per 24 hour  Intake      0 ml  Output   1175 ml  Net  -1175 ml   Filed Weights   04/08/13 2122 04/09/13 0430  Weight: 88.905 kg (196 lb) 89.5 kg (197 lb 5 oz)    Exam:   General:  Awake, otherwise not oriented?  Cardiovascular: Regular rate and rhythm  Respiratory: Decreased breath sounds throughout  Abdomen: Soft, nontender, nondistended positive bowel sounds  Musculoskeletal: No clubbing or cyanosis, trace edema  Neuro: No focal deficits   Data Reviewed: Basic Metabolic Panel:  Recent Labs Lab 04/08/13 2235 04/09/13 0500 04/09/13 1307 04/10/13 0150  NA 130* 131* 130* 134*  K 2.9* 4.2 4.4 2.9*  CL 83* 87* 88* 90*  CO2 33* 25 26 28   GLUCOSE 106* 125* 114* 92  BUN 10 10 9 8   CREATININE 1.28* 0.88 0.80 0.73  CALCIUM 9.5 9.1 9.0 9.0   Liver Function Tests:  Recent Labs Lab 04/08/13 2235 04/09/13 0500 04/10/13 0150  AST 27 34 19  ALT 21 18 16  ALKPHOS 73 62 63  BILITOT 0.3 0.3 0.3  PROT 7.7 6.8 6.7  ALBUMIN 3.7 3.1* 3.1*   No results found for this basename: LIPASE, AMYLASE,  in the last 168 hours  Recent Labs Lab 04/08/13 2256 04/10/13 0147  AMMONIA 31 21   CBC:  Recent Labs Lab 04/08/13 2235 04/09/13 0500 04/10/13 0150  WBC 8.1 4.5 9.1  NEUTROABS 4.6  --   --   HGB 12.1 12.0 12.0  HCT 35.2* 34.5* 34.8*  MCV 92.1 92.7 92.1  PLT 366 370 369   Cardiac Enzymes:  Recent Labs Lab 04/08/13 2235 04/09/13 0240  04/09/13 2140 04/10/13 0150 04/10/13 0903  TROPONINI 0.52* 0.48* 0.47* 0.50* 0.48*   BNP (last 3 results)  Recent Labs  08/03/12 0850 10/25/12 0930  PROBNP 745.1* 209.0*   CBG:  Recent Labs Lab 04/08/13 2225  GLUCAP 100*    Recent Results (from the past 240 hour(s))  MRSA PCR SCREENING     Status: None   Collection Time    04/09/13  4:39 AM      Result Value Ref Range Status   MRSA by PCR NEGATIVE  NEGATIVE Final   Comment:            The GeneXpert MRSA Assay (FDA     approved for NASAL specimens     only), is one component of a     comprehensive MRSA colonization     surveillance program. It is not     intended to diagnose MRSA     infection nor to guide or     monitor treatment for     MRSA infections.     Studies: Ct Head Wo Contrast  04/09/2013   CLINICAL DATA:  Lethargy.  EXAM: CT HEAD WITHOUT CONTRAST  TECHNIQUE: Contiguous axial images were obtained from the base of the skull through the vertex without intravenous contrast.  COMPARISON:  04/01/2013  FINDINGS: Skull and Sinuses:No significant abnormality.  Orbits: No acute abnormality.  Brain: No evidence of acute abnormality, such as acute infarction, hemorrhage, hydrocephalus, or mass lesion/mass effect. Prominent bifrontal extra-axial spaces consistent with cerebral volume loss- age advanced.  IMPRESSION: 1. No acute intracranial abnormality. 2. Brain atrophy.   Electronically Signed   By: Jorje Guild M.D.   On: 04/09/2013 00:32   Dg Chest Port 1 View  04/09/2013   CLINICAL DATA:  Respiratory distress.  EXAM: PORTABLE CHEST - 1 VIEW  COMPARISON:  DG CHEST 1V PORT dated 04/01/2013  FINDINGS: The cardiac silhouette appears mild to moderately enlarged, similar. Mediastinal silhouette is nonsuspicious, unchanged. Stable mild interstitial prominence with strandy densities in right lung base. No pleural effusions. Trachea projects midline and there is no pneumothorax.  ACDF. Multiple EKG lines overlie the patient and  may obscure subtle underlying pathology.  IMPRESSION: Stable cardiomegaly and chronic interstitial changes with right lung base strandy densities likely reflecting atelectasis.   Electronically Signed   By: Elon Alas   On: 04/09/2013 02:57    Scheduled Meds: . amitriptyline  100 mg Oral QHS  . DULoxetine  60 mg Oral q morning - 10a  . enoxaparin (LOVENOX) injection  40 mg Subcutaneous Daily  . escitalopram  20 mg Oral Daily  . hydrocortisone sod succinate (SOLU-CORTEF) inj  25 mg Intravenous 3 times per day   Continuous Infusions:    Principal Problem:   Acute encephalopathy Active Problems:   COPD (chronic obstructive pulmonary disease)   Tobacco use   Bipolar disorder  Adrenal cortical insufficiency   Hyponatremia   Elevated troponin   Hypokalemia   AKI (acute kidney injury)   Metabolic acidosis    Time spent: 25 minutes    [facility] Pager [phone]. If 7PM-7AM, please contact night-coverage at [email or web address], password [facility] 04/10/2013, 11:51 AM  LOS: 2 days

## 2013-04-10 NOTE — Consult Note (Signed)
Reason for Consult: Bipolar disorder with altered mental status Referring Physician: Dr. Chiquita Sullivan is an 58 y.o. female.  HPI: Patient was seen and chart reviewed. Patient was unable to provide/contribute in history. Information obtained from the available medical records and case discussed with patient's son and his fiance. Reportedly patient has been in her normal health until Tuesday. Patient was living by herself since he completed rehabilitation times 2 months after neck surgery. Patient presented with altered mental state status and possible drug overdose especially benzodiazepines.   Mental Status Examination: Patient appeared as per his stated age, lying in her bed, open eyes but not able to track the finger and repeating numbers and words like 9, 9, 9, 9 etc. and unable to recognize her own son and son's fiance who was in the room.  Past Medical History  Diagnosis Date  . COPD (chronic obstructive pulmonary disease)   . Depression   . Chronic back pain     DDD, disc bulge, radiculopathy, spinal stenosis  . Hyperlipidemia   . Anxiety   . Bipolar disorder, unspecified   . Carotid artery calcification   . DDD (degenerative disc disease), lumbosacral   . Spinal stenosis, lumbar   . Thyroid disease   . Autoimmune thyroiditis   . Hypertension   . TIA (transient ischemic attack)     august 2014  . GERD (gastroesophageal reflux disease)   . H/O dizziness   . Neuropathic pain   . Adrenal gland dysfunction     Addison's disease ON DAILY PREDNISONE  . Addison's disease   . Stroke     HX OF MINI STROKE AUG 2014- DROPPIN EVERYTHING, SPEECH SLURRED, MEMORY AFFECTED --ALL RESOLVED - NO DEFICITS NOW  . Pneumonia     LAST PNEUMONIA WAS AUG 2014  . Achalasia   . Mouth ulcers   . Dysrhythmia     palpitations  . Melanoma     removed 1 week ago at Dr Teryl Lucy office    Past Surgical History  Procedure Laterality Date  . Appendectomy    . Cholecystectomy    . Lump left  breast      removed- benign  . Abdominal hysterectomy      tubal pregnancy  . Inguinal hernia repair    . Stomach surgery      ?holes in esophgous  . Ectopic pregnancy surgery    . Back surgery      x 5;1984;1989;;1999;2000;2010  . Colonoscopy  01/25/2012    Procedure: COLONOSCOPY;  Surgeon: Rogene Houston, MD;  Location: [facility];  Service: Endoscopy;  Laterality: N/A;  130  . Esophagogastroduodenoscopy (egd) with esophageal dilation N/A 08/13/2012    Procedure: ESOPHAGOGASTRODUODENOSCOPY (EGD) WITH ESOPHAGEAL DILATION;  Surgeon: Rogene Houston, MD;  Location: [facility];  Service: Endoscopy;  Laterality: N/A;  315  . Breast surgery      left breast  . Anterior cervical decomp/discectomy fusion N/A 10/21/2012    Procedure: Cervical Six-Seven Anterior cervical decompression/diskectomy/fusion;  Surgeon: Kristeen Miss, MD;  Location: [place];  Service: Neurosurgery;  Laterality: N/A;  Cervical Six-Seven Anterior cervical decompression/diskectomy/fusion  . Cataract extraction w/phaco Right 11/25/2012    Procedure: CATARACT EXTRACTION PHACO AND INTRAOCULAR LENS PLACEMENT (IOC);  Surgeon: Elta Guadeloupe T. Gershon Crane, MD;  Location: [facility];  Service: Ophthalmology;  Laterality: Right;  CDE:10.06  . Cataract extraction w/phaco Left 12/09/2012    Procedure: CATARACT EXTRACTION PHACO AND INTRAOCULAR LENS PLACEMENT (IOC);  Surgeon: Elta Guadeloupe T. Gershon Crane, MD;  Location: [facility];  Service: Ophthalmology;  Laterality: Left;  CDE:5.06  . Tonsillectomy    . Hiatal hernia repair    . Heller myotomy N/A 03/17/2013    Procedure: DIAGNOSTIC LAPAROSCOPY, LAPAROSCOPIC HELLER MYOTOMY, ENDOSCOPY, DOR FUNDOPLICATION;  Surgeon: Ralene Ok, MD;  Location: [facility];  Service: General;  Laterality: N/A;    Family History  Problem Relation Age of Onset  . Hypertension Mother   . Heart disease Mother   . Heart attack Mother   . Anxiety disorder Mother   . Cancer Father     lung cancer  . Heart attack Father   .  Alcohol abuse Father   . Hypertension Brother   . Alcohol abuse Brother   . Bipolar disorder Brother   . ADD / ADHD Brother   . Drug abuse Brother   . OCD Brother   . Alcohol abuse Sister   . Bipolar disorder Sister   . Anxiety disorder Sister   . ADD / ADHD Sister   . Drug abuse Sister   . Physical abuse Sister   . Sexual abuse Sister   . Alcohol abuse Brother   . Bipolar disorder Brother   . ADD / ADHD Brother   . Drug abuse Brother   . Alcohol abuse Sister   . Bipolar disorder Sister   . Anxiety disorder Sister   . ADD / ADHD Sister   . Physical abuse Sister   . Sexual abuse Sister   . Alcohol abuse Brother   . Bipolar disorder Brother   . ADD / ADHD Brother   . Bipolar disorder Brother   . ADD / ADHD Brother   . Alcohol abuse Brother   . Paranoid behavior Brother   . Physical abuse Brother   . Sexual abuse Brother   . Bipolar disorder Maternal Aunt   . Bipolar disorder Paternal Aunt   . Bipolar disorder Maternal Uncle   . Bipolar disorder Paternal Uncle   . Bipolar disorder Maternal Grandfather   . Alcohol abuse Maternal Grandfather   . Bipolar disorder Maternal Grandmother   . Alcohol abuse Maternal Grandmother   . Anxiety disorder Maternal Grandmother   . Dementia Maternal Grandmother   . Bipolar disorder Paternal Grandfather   . Alcohol abuse Paternal Grandfather   . Bipolar disorder Paternal Grandmother   . Alcohol abuse Paternal Grandmother   . Drug abuse Paternal Grandmother   . Bipolar disorder Maternal Uncle   . Schizophrenia Neg Hx   . Seizures Neg Hx     Social History:  reports that she has been smoking Cigarettes.  She has a 10 pack-year smoking history. She has never used smokeless tobacco. She reports that she does not drink alcohol or use illicit drugs.  Allergies:  Allergies  Allergen Reactions  . Codeine Itching  . Cyclobenzaprine Hives  . Darvocet [Propoxyphene N-Acetaminophen] Rash  . Abilify [Aripiprazole] Other (See Comments)     tremors  . Ace Inhibitors   . Adhesive [Tape] Hives  . Gabapentin Swelling  . Iodine Hives  . Levofloxacin Hives and Hypertension  . Sulfa Antibiotics Itching  . Robaxin [Methocarbamol] Rash    Medications: I have reviewed the patient's current medications.  Results for orders placed during the hospital encounter of 04/08/13 (from the past 48 hour(s))  CBG MONITORING, ED     Status: Abnormal   Collection Time    04/08/13 10:25 PM      Result Value Ref Range   Glucose-Capillary 100 (*) 70 -  99 mg/dL  URINE RAPID DRUG SCREEN (HOSP PERFORMED)     Status: Abnormal   Collection Time    04/08/13 10:30 PM      Result Value Ref Range   Opiates NONE DETECTED  NONE DETECTED   Cocaine NONE DETECTED  NONE DETECTED   Benzodiazepines POSITIVE (*) NONE DETECTED   Amphetamines NONE DETECTED  NONE DETECTED   Tetrahydrocannabinol NONE DETECTED  NONE DETECTED   Barbiturates NONE DETECTED  NONE DETECTED   Comment:            DRUG SCREEN FOR MEDICAL PURPOSES     ONLY.  IF CONFIRMATION IS NEEDED     FOR ANY PURPOSE, NOTIFY LAB     WITHIN 5 DAYS.                LOWEST DETECTABLE LIMITS     FOR URINE DRUG SCREEN     Drug Class       Cutoff (ng/mL)     Amphetamine      1000     Barbiturate      200     Benzodiazepine   962     Tricyclics       836     Opiates          300     Cocaine          300     THC              50  URINALYSIS, ROUTINE W REFLEX MICROSCOPIC     Status: None   Collection Time    04/08/13 10:30 PM      Result Value Ref Range   Color, Urine YELLOW  YELLOW   APPearance CLEAR  CLEAR   Specific Gravity, Urine 1.010  1.005 - 1.030   pH 7.5  5.0 - 8.0   Glucose, UA NEGATIVE  NEGATIVE mg/dL   Hgb urine dipstick NEGATIVE  NEGATIVE   Bilirubin Urine NEGATIVE  NEGATIVE   Ketones, ur NEGATIVE  NEGATIVE mg/dL   Protein, ur NEGATIVE  NEGATIVE mg/dL   Urobilinogen, UA 1.0  0.0 - 1.0 mg/dL   Nitrite NEGATIVE  NEGATIVE   Leukocytes, UA NEGATIVE  NEGATIVE   Comment: MICROSCOPIC  NOT DONE ON URINES WITH NEGATIVE PROTEIN, BLOOD, LEUKOCYTES, NITRITE, OR GLUCOSE <1000 mg/dL.  CBC WITH DIFFERENTIAL     Status: Abnormal   Collection Time    04/08/13 10:35 PM      Result Value Ref Range   WBC 8.1  4.0 - 10.5 K/uL   RBC 3.82 (*) 3.87 - 5.11 MIL/uL   Hemoglobin 12.1  12.0 - 15.0 g/dL   HCT 35.2 (*) 36.0 - 46.0 %   MCV 92.1  78.0 - 100.0 fL   MCH 31.7  26.0 - 34.0 pg   MCHC 34.4  30.0 - 36.0 g/dL   RDW 13.6  11.5 - 15.5 %   Platelets 366  150 - 400 K/uL   Neutrophils Relative % 56  43 - 77 %   Neutro Abs 4.6  1.7 - 7.7 K/uL   Lymphocytes Relative 28  12 - 46 %   Lymphs Abs 2.2  0.7 - 4.0 K/uL   Monocytes Relative 13 (*) 3 - 12 %   Monocytes Absolute 1.1 (*) 0.1 - 1.0 K/uL   Eosinophils Relative 3  0 - 5 %   Eosinophils Absolute 0.2  0.0 - 0.7 K/uL   Basophils Relative 0  0 - 1 %  Basophils Absolute 0.0  0.0 - 0.1 K/uL  COMPREHENSIVE METABOLIC PANEL     Status: Abnormal   Collection Time    04/08/13 10:35 PM      Result Value Ref Range   Sodium 130 (*) 137 - 147 mEq/L   Potassium 2.9 (*) 3.7 - 5.3 mEq/L   Comment: CRITICAL RESULT CALLED TO, READ BACK BY AND VERIFIED WITH:     WINN T AT [id number] ON [id number] BY FORSYTH K   Chloride 83 (*) 96 - 112 mEq/L   CO2 33 (*) 19 - 32 mEq/L   Glucose, Bld 106 (*) 70 - 99 mg/dL   BUN 10  6 - 23 mg/dL   Creatinine, Ser 1.28 (*) 0.50 - 1.10 mg/dL   Calcium 9.5  8.4 - 10.5 mg/dL   Total Protein 7.7  6.0 - 8.3 g/dL   Albumin 3.7  3.5 - 5.2 g/dL   AST 27  0 - 37 U/L   ALT 21  0 - 35 U/L   Alkaline Phosphatase 73  39 - 117 U/L   Total Bilirubin 0.3  0.3 - 1.2 mg/dL   GFR calc non Af Amer 45 (*) >90 mL/min   GFR calc Af Amer 53 (*) >90 mL/min   Comment: (NOTE)     The eGFR has been calculated using the CKD EPI equation.     This calculation has not been validated in all clinical situations.     eGFR's persistently <90 mL/min signify possible Chronic Kidney     Disease.  TROPONIN I     Status: Abnormal   Collection Time     04/08/13 10:35 PM      Result Value Ref Range   Troponin I 0.52 (*) <0.30 ng/mL   Comment:            Due to the release kinetics of cTnI,     a negative result within the first hours     of the onset of symptoms does not rule out     myocardial infarction with certainty.     If myocardial infarction is still suspected,     repeat the test at appropriate intervals.     CRITICAL RESULT CALLED TO, READ BACK BY AND VERIFIED WITH:     MOORE S AT 2309 ON [id number] BY FORSYTH K  AMMONIA     Status: None   Collection Time    04/08/13 10:56 PM      Result Value Ref Range   Ammonia 31  11 - 60 umol/L  LACTIC ACID, PLASMA     Status: None   Collection Time    04/08/13 10:56 PM      Result Value Ref Range   Lactic Acid, Venous 0.6  0.5 - 2.2 mmol/L  BLOOD GAS, ARTERIAL     Status: Abnormal   Collection Time    04/09/13 12:53 AM      Result Value Ref Range   O2 Content 2.0     Delivery systems NASAL CANNULA     pH, Arterial 7.415  7.350 - 7.450   pCO2 arterial 50.0 (*) 35.0 - 45.0 mmHg   pO2, Arterial 71.3 (*) 80.0 - 100.0 mmHg   Bicarbonate 31.4 (*) 20.0 - 24.0 mEq/L   TCO2 28.2  0 - 100 mmol/L   Acid-Base Excess 6.9 (*) 0.0 - 2.0 mmol/L   O2 Saturation 93.5     Patient temperature 37.0     Collection site RIGHT RADIAL  Drawn by [id number]     Sample type ARTERIAL     Allens test (pass/fail) PASS  PASS  TROPONIN I     Status: Abnormal   Collection Time    04/09/13  2:40 AM      Result Value Ref Range   Troponin I 0.48 (*) <0.30 ng/mL   Comment:            Due to the release kinetics of cTnI,     a negative result within the first hours     of the onset of symptoms does not rule out     myocardial infarction with certainty.     If myocardial infarction is still suspected,     repeat the test at appropriate intervals.     CRITICAL VALUE NOTED.  VALUE IS CONSISTENT WITH PREVIOUSLY REPORTED AND CALLED VALUE.  MRSA PCR SCREENING     Status: None   Collection Time    04/09/13  4:39  AM      Result Value Ref Range   MRSA by PCR NEGATIVE  NEGATIVE   Comment:            The GeneXpert MRSA Assay (FDA     approved for NASAL specimens     only), is one component of a     comprehensive MRSA colonization     surveillance program. It is not     intended to diagnose MRSA     infection nor to guide or     monitor treatment for     MRSA infections.  CBC     Status: Abnormal   Collection Time    04/09/13  5:00 AM      Result Value Ref Range   WBC 4.5  4.0 - 10.5 K/uL   RBC 3.72 (*) 3.87 - 5.11 MIL/uL   Hemoglobin 12.0  12.0 - 15.0 g/dL   HCT 34.5 (*) 36.0 - 46.0 %   MCV 92.7  78.0 - 100.0 fL   MCH 32.3  26.0 - 34.0 pg   MCHC 34.8  30.0 - 36.0 g/dL   RDW 13.9  11.5 - 15.5 %   Platelets 370  150 - 400 K/uL  COMPREHENSIVE METABOLIC PANEL     Status: Abnormal   Collection Time    04/09/13  5:00 AM      Result Value Ref Range   Sodium 131 (*) 137 - 147 mEq/L   Potassium 4.2  3.7 - 5.3 mEq/L   Comment: HEMOLYSIS AT THIS LEVEL MAY AFFECT RESULT   Chloride 87 (*) 96 - 112 mEq/L   CO2 25  19 - 32 mEq/L   Glucose, Bld 125 (*) 70 - 99 mg/dL   BUN 10  6 - 23 mg/dL   Creatinine, Ser 0.88  0.50 - 1.10 mg/dL   Calcium 9.1  8.4 - 10.5 mg/dL   Total Protein 6.8  6.0 - 8.3 g/dL   Albumin 3.1 (*) 3.5 - 5.2 g/dL   AST 34  0 - 37 U/L   Comment: HEMOLYSIS AT THIS LEVEL MAY AFFECT RESULT   ALT 18  0 - 35 U/L   Comment: HEMOLYSIS AT THIS LEVEL MAY AFFECT RESULT   Alkaline Phosphatase 62  39 - 117 U/L   Comment: HEMOLYSIS AT THIS LEVEL MAY AFFECT RESULT   Total Bilirubin 0.3  0.3 - 1.2 mg/dL   GFR calc non Af Amer 72 (*) >90 mL/min   GFR calc Af Amer 83 (*) >90 mL/min  Comment: (NOTE)     The eGFR has been calculated using the CKD EPI equation.     This calculation has not been validated in all clinical situations.     eGFR's persistently <90 mL/min signify possible Chronic Kidney     Disease.  LACTIC ACID, PLASMA     Status: None   Collection Time    04/09/13 12:07 PM       Result Value Ref Range   Lactic Acid, Venous 0.7  0.5 - 2.2 mmol/L  SALICYLATE LEVEL     Status: Abnormal   Collection Time    04/09/13  1:07 PM      Result Value Ref Range   Salicylate Lvl <6.2 (*) 2.8 - 20.0 mg/dL  BASIC METABOLIC PANEL     Status: Abnormal   Collection Time    04/09/13  1:07 PM      Result Value Ref Range   Sodium 130 (*) 137 - 147 mEq/L   Potassium 4.4  3.7 - 5.3 mEq/L   Chloride 88 (*) 96 - 112 mEq/L   CO2 26  19 - 32 mEq/L   Glucose, Bld 114 (*) 70 - 99 mg/dL   BUN 9  6 - 23 mg/dL   Creatinine, Ser 0.80  0.50 - 1.10 mg/dL   Calcium 9.0  8.4 - 10.5 mg/dL   GFR calc non Af Amer 80 (*) >90 mL/min   GFR calc Af Amer >90  >90 mL/min   Comment: (NOTE)     The eGFR has been calculated using the CKD EPI equation.     This calculation has not been validated in all clinical situations.     eGFR's persistently <90 mL/min signify possible Chronic Kidney     Disease.  TROPONIN I     Status: Abnormal   Collection Time    04/09/13  9:40 PM      Result Value Ref Range   Troponin I 0.47 (*) <0.30 ng/mL   Comment:            Due to the release kinetics of cTnI,     a negative result within the first hours     of the onset of symptoms does not rule out     myocardial infarction with certainty.     If myocardial infarction is still suspected,     repeat the test at appropriate intervals.     CRITICAL RESULT CALLED TO, READ BACK BY AND VERIFIED WITH:     L WORLEY,RN 2255 04/09/13 WBOND  AMMONIA     Status: None   Collection Time    04/10/13  1:47 AM      Result Value Ref Range   Ammonia 21  11 - 60 umol/L  COMPREHENSIVE METABOLIC PANEL     Status: Abnormal   Collection Time    04/10/13  1:50 AM      Result Value Ref Range   Sodium 134 (*) 137 - 147 mEq/L   Potassium 2.9 (*) 3.7 - 5.3 mEq/L   Comment: CRITICAL RESULT CALLED TO, READ BACK BY AND VERIFIED WITH:     Chana Bode (RN) 0248 04/10/2013 L. LOMAX   Chloride 90 (*) 96 - 112 mEq/L   CO2 28  19 - 32 mEq/L    Glucose, Bld 92  70 - 99 mg/dL   BUN 8  6 - 23 mg/dL   Creatinine, Ser 0.73  0.50 - 1.10 mg/dL   Calcium 9.0  8.4 - 10.5 mg/dL  Total Protein 6.7  6.0 - 8.3 g/dL   Albumin 3.1 (*) 3.5 - 5.2 g/dL   AST 19  0 - 37 U/L   ALT 16  0 - 35 U/L   Alkaline Phosphatase 63  39 - 117 U/L   Total Bilirubin 0.3  0.3 - 1.2 mg/dL   GFR calc non Af Amer >90  >90 mL/min   GFR calc Af Amer >90  >90 mL/min   Comment: (NOTE)     The eGFR has been calculated using the CKD EPI equation.     This calculation has not been validated in all clinical situations.     eGFR's persistently <90 mL/min signify possible Chronic Kidney     Disease.  CBC     Status: Abnormal   Collection Time    04/10/13  1:50 AM      Result Value Ref Range   WBC 9.1  4.0 - 10.5 K/uL   RBC 3.78 (*) 3.87 - 5.11 MIL/uL   Hemoglobin 12.0  12.0 - 15.0 g/dL   HCT 34.8 (*) 36.0 - 46.0 %   MCV 92.1  78.0 - 100.0 fL   MCH 31.7  26.0 - 34.0 pg   MCHC 34.5  30.0 - 36.0 g/dL   RDW 13.5  11.5 - 15.5 %   Platelets 369  150 - 400 K/uL  TROPONIN I     Status: Abnormal   Collection Time    04/10/13  1:50 AM      Result Value Ref Range   Troponin I 0.50 (*) <0.30 ng/mL   Comment:            Due to the release kinetics of cTnI,     a negative result within the first hours     of the onset of symptoms does not rule out     myocardial infarction with certainty.     If myocardial infarction is still suspected,     repeat the test at appropriate intervals.     CRITICAL VALUE NOTED.  VALUE IS CONSISTENT WITH PREVIOUSLY REPORTED AND CALLED VALUE.  TROPONIN I     Status: Abnormal   Collection Time    04/10/13  9:03 AM      Result Value Ref Range   Troponin I 0.48 (*) <0.30 ng/mL   Comment:            Due to the release kinetics of cTnI,     a negative result within the first hours     of the onset of symptoms does not rule out     myocardial infarction with certainty.     If myocardial infarction is still suspected,     repeat the test at  appropriate intervals.     CRITICAL VALUE NOTED.  VALUE IS CONSISTENT WITH PREVIOUSLY REPORTED AND CALLED VALUE.    Ct Head Wo Contrast  04/09/2013   CLINICAL DATA:  Lethargy.  EXAM: CT HEAD WITHOUT CONTRAST  TECHNIQUE: Contiguous axial images were obtained from the base of the skull through the vertex without intravenous contrast.  COMPARISON:  04/01/2013  FINDINGS: Skull and Sinuses:No significant abnormality.  Orbits: No acute abnormality.  Brain: No evidence of acute abnormality, such as acute infarction, hemorrhage, hydrocephalus, or mass lesion/mass effect. Prominent bifrontal extra-axial spaces consistent with cerebral volume loss- age advanced.  IMPRESSION: 1. No acute intracranial abnormality. 2. Brain atrophy.   Electronically Signed   By: Jorje Guild M.D.   On: 04/09/2013 00:32  Dg Chest Port 1 View  04/09/2013   CLINICAL DATA:  Respiratory distress.  EXAM: PORTABLE CHEST - 1 VIEW  COMPARISON:  DG CHEST 1V PORT dated 04/01/2013  FINDINGS: The cardiac silhouette appears mild to moderately enlarged, similar. Mediastinal silhouette is nonsuspicious, unchanged. Stable mild interstitial prominence with strandy densities in right lung base. No pleural effusions. Trachea projects midline and there is no pneumothorax.  ACDF. Multiple EKG lines overlie the patient and may obscure subtle underlying pathology.  IMPRESSION: Stable cardiomegaly and chronic interstitial changes with right lung base strandy densities likely reflecting atelectasis.   Electronically Signed   By: Elon Alas   On: 04/09/2013 02:57    Positive for anxiety, bad mood, behavior problems, depression, illegal drug usage and sleep disturbance Blood pressure 158/86, pulse 95, temperature 98.4 F (36.9 C), temperature source Oral, resp. rate 17, height 5' 7"  (1.702 m), weight 89.5 kg (197 lb 5 oz), SpO2 100.00%.   Assessment/Plan: Mood disorder not otherwise specified  Recommendation: Patient was unable to participate in  psychiatric evaluation at this time Please contact psychiatric consultation he patient was able to contribute Patient does not meet criteria for capacity to make her own medical decisions at this time and her son has medical care power of attorney if needed placement. Appreciate psychiatric consultation  Durward Parcel 04/10/2013, 12:24 PM

## 2013-04-11 DIAGNOSIS — R488 Other symbolic dysfunctions: Secondary | ICD-10-CM

## 2013-04-11 DIAGNOSIS — R5381 Other malaise: Secondary | ICD-10-CM

## 2013-04-11 DIAGNOSIS — R5383 Other fatigue: Secondary | ICD-10-CM

## 2013-04-11 LAB — CBC
HEMATOCRIT: 38 % (ref 36.0–46.0)
HEMOGLOBIN: 13.1 g/dL (ref 12.0–15.0)
MCH: 32.3 pg (ref 26.0–34.0)
MCHC: 34.5 g/dL (ref 30.0–36.0)
MCV: 93.8 fL (ref 78.0–100.0)
Platelets: 402 10*3/uL — ABNORMAL HIGH (ref 150–400)
RBC: 4.05 MIL/uL (ref 3.87–5.11)
RDW: 13.9 % (ref 11.5–15.5)
WBC: 11.4 10*3/uL — AB (ref 4.0–10.5)

## 2013-04-11 LAB — BASIC METABOLIC PANEL
BUN: 13 mg/dL (ref 6–23)
CHLORIDE: 94 meq/L — AB (ref 96–112)
CO2: 25 mEq/L (ref 19–32)
Calcium: 9.1 mg/dL (ref 8.4–10.5)
Creatinine, Ser: 0.84 mg/dL (ref 0.50–1.10)
GFR calc Af Amer: 88 mL/min — ABNORMAL LOW (ref >90)
GFR calc non Af Amer: 76 mL/min — ABNORMAL LOW (ref >90)
Glucose, Bld: 102 mg/dL — ABNORMAL HIGH (ref 70–99)
POTASSIUM: 3.4 meq/L — AB (ref 3.7–5.3)
Sodium: 136 mEq/L — ABNORMAL LOW (ref 137–147)

## 2013-04-11 LAB — TROPONIN I: Troponin I: 0.47 ng/mL (ref <0.30)

## 2013-04-11 MED ORDER — LORAZEPAM 2 MG/ML IJ SOLN
1.0000 mg | INTRAMUSCULAR | Status: DC | PRN
  Administered 2013-04-11 – 2013-04-14 (×5): 1 mg via INTRAVENOUS
  Filled 2013-04-11 (×6): qty 1

## 2013-04-11 MED ORDER — DULOXETINE HCL 60 MG PO CPEP
60.0000 mg | ORAL_CAPSULE | Freq: Every day | ORAL | Status: DC
  Administered 2013-04-12 – 2013-04-14 (×3): 60 mg via ORAL
  Filled 2013-04-11 (×4): qty 1

## 2013-04-11 MED ORDER — HYDROCORTISONE NA SUCCINATE PF 100 MG IJ SOLR
30.0000 mg | Freq: Every day | INTRAMUSCULAR | Status: AC
  Administered 2013-04-12: 30 mg via INTRAVENOUS
  Filled 2013-04-11: qty 0.6

## 2013-04-11 MED ORDER — ESCITALOPRAM OXALATE 20 MG PO TABS
20.0000 mg | ORAL_TABLET | Freq: Every day | ORAL | Status: DC
  Administered 2013-04-12 – 2013-04-14 (×3): 20 mg via ORAL
  Filled 2013-04-11 (×4): qty 1

## 2013-04-11 MED ORDER — AMITRIPTYLINE HCL 100 MG PO TABS
100.0000 mg | ORAL_TABLET | Freq: Every day | ORAL | Status: DC
  Administered 2013-04-11 – 2013-04-13 (×3): 100 mg via ORAL
  Filled 2013-04-11 (×4): qty 1

## 2013-04-11 NOTE — Progress Notes (Signed)
[facility] PROGRESS NOTE  Tonya Sullivan JYN:[id number] DOB: [birth year] DOA: 04/08/2013 PCP: Vic Blackbird, MD  Assessment/Plan: Principal Problem:   Acute encephalopathy/altered sensorium: Unclear etiology. Given the overall negative labs, imaging and drug screen, her presentation may be more consistent with mental health issues possibly mania versus schizophrenia. Have restarted her home medications for depression and have asked psychiatry to see. Noted urine drug screen positive for benzos, but not opiates. Have DC'd fentanyl patch.  At this time, I do not think the patient is able to care for herself in the short or long-term setting. Have spoken with the patient's mother who tells me that patient is often forgetful, but on a good day, she is able to interact well but she does have chronic issues and often talks to herself. Patient has been having echolalia and word perseverance. His findings can be seen in patients with schizophrenia or dementia or Tourette's. Interestingly, she was not cleared to eat because of concerns of confusion. But actually despite word perseverance and repetition, she was able to follow commands when asked her to take a deep breath during her exam and did not talk when I listened to her heart beat. In addition, when I actually named food and touch her stomach to indicate eating, she did repeat the word yes over and over. At that point, in ordering a meal tray with close supervision and coaching, the patient actually did very well with eating. I suspected this is not in acute delirium and may be more of underlying chronic mental illness or early dementia. We'll plan to try to adjust home medications with meals  Active Problems:   COPD (chronic obstructive pulmonary disease): Looks to be stable. Oxygen levels normal.   Tobacco use: Patient not on patch   Bipolar disorder: ? Diagnosis. Patient has been following with a psychiatrist as outpatient. Her mother states that  she does have depression and suspect that she likely has bipolar as that runs in the family. 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   Elevated troponin: Minimal elevation, which may be more related to chronic lung issues. We'll continue to cycle enzymes, trending down   Hypokalemia   AKI (acute kidney injury): Secondary to dehydration? With hydration, creatinine normalized on 4/9    Metabolic acidosis: Followup labs on 4/9 noted anion gap of 18. Ordered stat lactic acid level and salicylate level, both of which are normal. Repeat labs on 4/10 at 16  Code Status:  full code Family Communication: Discussed with son by phone  Disposition Plan: Will need nursing home placement   Consultants:  Psychiatry  Procedures:  None  Antibiotics:  None  HPI/Subjective: Patient more alert today. However, appears to be acting out versus his psychosis. She does not directly answer questions, but instead will choose a word and repeated over and over with [place]-like motions  Objective: Filed Vitals:   04/11/13 1336  BP: 115/81  Pulse: 90  Temp: 97.4 F (36.3 C)  Resp: 18    Intake/Output Summary (Last 24 hours) at 04/11/13 1706 Last data filed at 04/11/13 1300  Gross per 24 hour  Intake    120 ml  Output      0 ml  Net    120 ml   Filed Weights   04/08/13 2122 04/09/13 0430 04/10/13 2050  Weight: 88.905 kg (196 lb) 89.5 kg (197 lb 5 oz) 67.9  kg (149 lb 11.1 oz)    Exam:   General:  Awake, interactive. Actually does follow commands.  Cardiovascular: Regular rate and rhythm  Respiratory: Decreased breath sounds throughout  Abdomen: Soft, nontender, nondistended positive bowel sounds  Musculoskeletal: No clubbing or cyanosis, trace edema  Neuro: No focal deficits   Data Reviewed: Basic Metabolic Panel:  Recent Labs Lab 04/08/13 2235 04/09/13 0500  04/09/13 1307 04/10/13 0150 04/11/13 1404  NA 130* 131* 130* 134* 136*  K 2.9* 4.2 4.4 2.9* 3.4*  CL 83* 87* 88* 90* 94*  CO2 33* 25 26 28 25   GLUCOSE 106* 125* 114* 92 102*  BUN 10 10 9 8 13   CREATININE 1.28* 0.88 0.80 0.73 0.84  CALCIUM 9.5 9.1 9.0 9.0 9.1   Liver Function Tests:  Recent Labs Lab 04/08/13 2235 04/09/13 0500 04/10/13 0150  AST 27 34 19  ALT 21 18 16   ALKPHOS 73 62 63  BILITOT 0.3 0.3 0.3  PROT 7.7 6.8 6.7  ALBUMIN 3.7 3.1* 3.1*   No results found for this basename: LIPASE, AMYLASE,  in the last 168 hours  Recent Labs Lab 04/08/13 2256 04/10/13 0147  AMMONIA 31 21   CBC:  Recent Labs Lab 04/08/13 2235 04/09/13 0500 04/10/13 0150 04/11/13 1404  WBC 8.1 4.5 9.1 11.4*  NEUTROABS 4.6  --   --   --   HGB 12.1 12.0 12.0 13.1  HCT 35.2* 34.5* 34.8* 38.0  MCV 92.1 92.7 92.1 93.8  PLT 366 370 369 402*   Cardiac Enzymes:  Recent Labs Lab 04/09/13 0240 04/09/13 2140 04/10/13 0150 04/10/13 0903 04/11/13 1404  TROPONINI 0.48* 0.47* 0.50* 0.48* 0.47*   BNP (last 3 results)  Recent Labs  08/03/12 0850 10/25/12 0930  PROBNP 745.1* 209.0*   CBG:  Recent Labs Lab 04/08/13 2225  GLUCAP 100*    Recent Results (from the past 240 hour(s))  MRSA PCR SCREENING     Status: None   Collection Time    04/09/13  4:39 AM      Result Value Ref Range Status   MRSA by PCR NEGATIVE  NEGATIVE Final   Comment:            The GeneXpert MRSA Assay (FDA     approved for NASAL specimens     only), is one component of a     comprehensive MRSA colonization     surveillance program. It is not     intended to diagnose MRSA     infection nor to guide or     monitor treatment for     MRSA infections.     Studies: No results found.  Scheduled Meds: . amitriptyline  100 mg Oral QHS  . antiseptic oral rinse  15 mL Mouth Rinse q12n4p  . chlorhexidine  15 mL Mouth Rinse BID  . DULoxetine  60 mg Oral q morning - 10a  . enoxaparin (LOVENOX) injection   40 mg Subcutaneous Daily  . escitalopram  20 mg Oral Daily  . hydrocortisone sod succinate (SOLU-CORTEF) inj  25 mg Intravenous 3 times per day  . metoprolol  5 mg Intravenous 4 times per day   Continuous Infusions:    Principal Problem:   Acute encephalopathy Active Problems:   COPD (chronic obstructive pulmonary disease)   Tobacco use   Bipolar disorder   Adrenal cortical insufficiency   Hyponatremia   Elevated troponin   Hypokalemia   AKI (acute kidney injury)   Metabolic acidosis  Time spent: 35 minutes    [facility] Pager [phone]. If 7PM-7AM, please contact night-coverage at [email or web address], password [facility] 04/11/2013, 5:06 PM  LOS: 3 days

## 2013-04-11 NOTE — Progress Notes (Signed)
Foley removed. After doing an assessment, there are no clear indications for it.

## 2013-04-11 NOTE — Progress Notes (Signed)
Patient's family (mother) was notified that patient had to be placed in restraints due to pulling at lines, and continuously climbing out of bed.  Will continue to monitor patient.

## 2013-04-11 NOTE — Progress Notes (Signed)
Pt. Was found with foley removed. Pt. Had pulled out foley, it was found in her arms by the tech at 1800.  No bleeding noted at this time, no signs of pain noted at this time. MD notified. Will continue to monitor.

## 2013-04-11 NOTE — Progress Notes (Signed)
Foley removed, no indication for it.

## 2013-04-12 DIAGNOSIS — E872 Acidosis, unspecified: Secondary | ICD-10-CM

## 2013-04-12 DIAGNOSIS — I10 Essential (primary) hypertension: Secondary | ICD-10-CM

## 2013-04-12 MED ORDER — ALPRAZOLAM 0.25 MG PO TABS
0.2500 mg | ORAL_TABLET | Freq: Three times a day (TID) | ORAL | Status: DC
  Administered 2013-04-12 – 2013-04-14 (×7): 0.25 mg via ORAL
  Filled 2013-04-12 (×7): qty 1

## 2013-04-12 MED ORDER — RESOURCE THICKENUP CLEAR PO POWD
ORAL | Status: DC | PRN
  Filled 2013-04-12: qty 125

## 2013-04-12 MED ORDER — METOPROLOL TARTRATE 12.5 MG HALF TABLET
12.5000 mg | ORAL_TABLET | Freq: Four times a day (QID) | ORAL | Status: DC
  Administered 2013-04-12: 12.5 mg via ORAL
  Filled 2013-04-12 (×6): qty 1

## 2013-04-12 MED ORDER — METOPROLOL TARTRATE 25 MG PO TABS
25.0000 mg | ORAL_TABLET | Freq: Two times a day (BID) | ORAL | Status: DC
  Administered 2013-04-12 – 2013-04-14 (×4): 25 mg via ORAL
  Filled 2013-04-12 (×6): qty 1

## 2013-04-12 MED ORDER — PREDNISONE 5 MG PO TABS
7.5000 mg | ORAL_TABLET | Freq: Every day | ORAL | Status: DC
  Administered 2013-04-13 – 2013-04-14 (×2): 7.5 mg via ORAL
  Filled 2013-04-12 (×3): qty 1

## 2013-04-12 NOTE — Progress Notes (Addendum)
Pt had incontinence issue of urine around 2000 on 4/11, beginning of shift.  Walked pt to bathroom at that time, no urine output.  At 0000, talked pt to bathroom, no urine output.  Bladder scan showed 290 ml urine.  Notified Jonette Eva, NP.  Order placed for foley if bladder scan > 300.  At 0100, pt stated she had to go to the bathroom, walked pt to bathroom and no urine output again.  Bladder scan showed 324 ml.  Foley placed per order, sterile technique used.  Pt resting comfortably.  Will continue to monitor.  Iantha Fallen RN 3:07 AM 04/12/2013

## 2013-04-12 NOTE — Progress Notes (Addendum)
Clinical Social Work Department CLINICAL SOCIAL WORK PLACEMENT NOTE 04/12/2013  Patient:  Tonya Sullivan, Tonya Sullivan  Account Number:  [id number] [place] date:  04/08/2013  Clinical Social Worker:  Tilden Fossa, Latanya Presser  Date/time:  04/12/2013 10:16 AM  Clinical Social Work is seeking post-discharge placement for this patient at the following level of care:   SKILLED NURSING   (*CSW will update this form in Epic as items are completed)     Patient/family provided with [facility] Department of Clinical Social Work's list of facilities offering this level of care within the geographic area requested by the patient (or if unable, by the patient's family).  04/12/2013  Patient/family informed of their freedom to choose among providers that offer the needed level of care, that participate in Medicare, Medicaid or managed care program needed by the patient, have an available bed and are willing to accept the patient.    Patient/family informed of [facility]' ownership interest in [facility], as well as of the fact that they are under no obligation to receive care at this facility.  PASARR submitted to EDS on 10/31/2012 PASARR number received from [employer] on 10/31/2012  FL2 transmitted to all facilities in geographic area requested by pt/family on  04/12/2013 FL2 transmitted to all facilities within larger geographic area on   Patient informed that his/her managed care company has contracts with or will negotiate with  certain facilities, including the following:     Patient/family informed of bed offers received:   Patient chooses bed at [facility] Physician recommends and patient chooses bed at  n/a  Patient to be transferred to  [facility] on  04/13/2013 Patient to be transferred to facility by [facility]  The following physician request were entered in Epic:   Additional Comments:  Tilden Fossa, MSW, [place] Worker [facility]  Dept. [phone]

## 2013-04-12 NOTE — Progress Notes (Signed)
[facility] PROGRESS NOTE  Tonya Sullivan ZSW:[id number] DOB: 1955/11/21 DOA: 04/08/2013 PCP: Vic Blackbird, MD  Assessment/Plan: Principal Problem:   Acute encephalopathy/altered sensorium: Unclear etiology. Given the overall negative labs, imaging and drug screen, her presentation may be more consistent with mental health issues possibly mania versus schizophrenia. Have restarted her home medications for depression and have asked psychiatry to see. Noted urine drug screen positive for benzos, but not opiates. Have DC'd fentanyl patch.  At this time, I do not think the patient is able to care for herself in the short or long-term setting. Have spoken with the patient's mother who tells me that patient is often forgetful, but on a good day, she is able to interact well but she does have chronic issues and often talks to herself. Patient has been having echolalia and word perseverance. His findings can be seen in patients with schizophrenia or dementia or Tourette's.   Interestingly, she was not cleared to eat because of concerns of confusion. But actually despite word perseverance and repetition, she was able to follow commands when asked her to take a deep breath during her exam and did not talk when I listened to her heart beat. In addition, when I actually named food and touch her stomach to indicate eating, she did repeat the word yes over and over. At that point, in ordering a meal tray with close supervision and coaching, the patient actually did very well with eating. I suspected this is not in acute delirium and may be more of underlying chronic mental illness or early dementia. We'll plan to try to adjust home medications with meals  Because she does talks nonsensically, psychiatry unable to diagnose her. We'll discuss with psychiatry about adjustments of medications and looking at adding antipsychotic and perhaps decreasing SSRI. Have added scheduled low-dose Xanax 3 times a day with meals  given agitation episode yesterday  Active Problems:   COPD (chronic obstructive pulmonary disease): Looks to be stable. Oxygen levels normal.    Tobacco use: Patient not on patch    Bipolar disorder: ? Diagnosis. Patient has been following with a psychiatrist as outpatient. Her mother states that she does have depression and suspect that she likely has bipolar as that runs in the family. See above.    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   Elevated troponin: Minimal elevation, which may be more related to chronic lung issues. We'll continue to cycle enzymes, slowly trending down.    Hypokalemia    AKI (acute kidney injury): Secondary to dehydration? With hydration, creatinine normalized on 4/9    Metabolic acidosis: Followup labs on 4/9 noted anion gap of 18.  Ordered stat lactic acid level and salicylate level, both of which are normal. Repeat labs on 4/10 at 16  Code Status:  full code Family Communication: Discussed with son by phone yesterday  Disposition Plan: Will need nursing home placement   Consultants:  Psychiatry  Procedures:  None  Antibiotics:  None  HPI/Subjective: Patient more alert today. Some episodes of echolalia and also talking nonsensically.  Objective: Filed Vitals:   04/12/13 0517  BP: 145/88  Pulse: 82  Temp: 97.5 F (36.4 C)  Resp: 18    Intake/Output Summary (Last 24 hours) at 04/12/13 1024 Last data filed at 04/12/13 0518  Gross per 24 hour  Intake    120 ml  Output   1264 ml  Net  -  1144 ml   Filed Weights   04/08/13 2122 04/09/13 0430 04/10/13 2050  Weight: 88.905 kg (196 lb) 89.5 kg (197 lb 5 oz) 67.9 kg (149 lb 11.1 oz)    Exam:   General:  Awake, interactive. Actually does follow commands.  Cardiovascular: Regular rate and rhythm  Respiratory: Decreased breath sounds throughout  Abdomen: Soft, nontender,  nondistended positive bowel sounds  Musculoskeletal: No clubbing or cyanosis, trace edema  Neuro: No focal deficits   Data Reviewed: Basic Metabolic Panel:  Recent Labs Lab 04/08/13 2235 04/09/13 0500 04/09/13 1307 04/10/13 0150 04/11/13 1404  NA 130* 131* 130* 134* 136*  K 2.9* 4.2 4.4 2.9* 3.4*  CL 83* 87* 88* 90* 94*  CO2 33* 25 26 28 25   GLUCOSE 106* 125* 114* 92 102*  BUN 10 10 9 8 13   CREATININE 1.28* 0.88 0.80 0.73 0.84  CALCIUM 9.5 9.1 9.0 9.0 9.1   Liver Function Tests:  Recent Labs Lab 04/08/13 2235 04/09/13 0500 04/10/13 0150  AST 27 34 19  ALT 21 18 16   ALKPHOS 73 62 63  BILITOT 0.3 0.3 0.3  PROT 7.7 6.8 6.7  ALBUMIN 3.7 3.1* 3.1*   No results found for this basename: LIPASE, AMYLASE,  in the last 168 hours  Recent Labs Lab 04/08/13 2256 04/10/13 0147  AMMONIA 31 21   CBC:  Recent Labs Lab 04/08/13 2235 04/09/13 0500 04/10/13 0150 04/11/13 1404  WBC 8.1 4.5 9.1 11.4*  NEUTROABS 4.6  --   --   --   HGB 12.1 12.0 12.0 13.1  HCT 35.2* 34.5* 34.8* 38.0  MCV 92.1 92.7 92.1 93.8  PLT 366 370 369 402*   Cardiac Enzymes:  Recent Labs Lab 04/09/13 0240 04/09/13 2140 04/10/13 0150 04/10/13 0903 04/11/13 1404  TROPONINI 0.48* 0.47* 0.50* 0.48* 0.47*   BNP (last 3 results)  Recent Labs  08/03/12 0850 10/25/12 0930  PROBNP 745.1* 209.0*   CBG:  Recent Labs Lab 04/08/13 2225  GLUCAP 100*    Recent Results (from the past 240 hour(s))  MRSA PCR SCREENING     Status: None   Collection Time    04/09/13  4:39 AM      Result Value Ref Range Status   MRSA by PCR NEGATIVE  NEGATIVE Final   Comment:            The GeneXpert MRSA Assay (FDA     approved for NASAL specimens     only), is one component of a     comprehensive MRSA colonization     surveillance program. It is not     intended to diagnose MRSA     infection nor to guide or     monitor treatment for     MRSA infections.     Studies: No results  found.  Scheduled Meds: . ALPRAZolam  0.25 mg Oral TID WC  . amitriptyline  100 mg Oral Q supper  . antiseptic oral rinse  15 mL Mouth Rinse q12n4p  . chlorhexidine  15 mL Mouth Rinse BID  . DULoxetine  60 mg Oral Q breakfast  . enoxaparin (LOVENOX) injection  40 mg Subcutaneous Daily  . escitalopram  20 mg Oral Q breakfast  . hydrocortisone sod succinate (SOLU-CORTEF) inj  30 mg Intravenous Daily  . metoprolol tartrate  25 mg Oral BID WC  . [START ON 04/13/2013] predniSONE  7.5 mg Oral Q breakfast   Continuous Infusions:    Principal Problem:   Acute encephalopathy Active  Problems:   COPD (chronic obstructive pulmonary disease)   Tobacco use   Bipolar disorder   Adrenal cortical insufficiency   Hyponatremia   Elevated troponin   Hypokalemia   AKI (acute kidney injury)   Metabolic acidosis    Time spent: 25 minutes    [facility] Pager [phone]. If 7PM-7AM, please contact night-coverage at [email or web address], password [facility] 04/12/2013, 10:24 AM  LOS: 4 days

## 2013-04-12 NOTE — Progress Notes (Signed)
Pt restless.  Informed Jonette Eva, NP.  4 pt restraints ordered.  See restraint documentation for more information.  Informed Jonette Eva, NP of elevated troponins, no further orders.  Jeannie Fend [phone] 04/11/2013

## 2013-04-12 NOTE — Progress Notes (Signed)
Clinical Social Work Department BRIEF PSYCHOSOCIAL ASSESSMENT 04/12/2013  Patient:  Tonya Sullivan, Tonya Sullivan     Account Number:  [id number]     [place] date:  04/08/2013  Clinical Social Worker:  Su Monks  Date/Time:  04/12/2013 09:33 AM  Referred by:  Physician  Date Referred:  04/12/2013 Referred for  SNF Placement  Behavioral Health Issues   Other Referral:   Interview type:  Family Other interview type:   Weekend CSW spoke with son Harrell Gave via telephone    PSYCHOSOCIAL DATA Living Status:  ALONE Admitted from facility:   Level of care:   Primary support name:  Makensey Rego [phone] Primary support relationship to patient:  CHILD, ADULT Degree of support available:   Unknown    CURRENT CONCERNS Current Concerns  Post-Acute Placement  Behavioral Health Issues  Substance Abuse   Other Concerns:    SOCIAL WORK ASSESSMENT / PLAN Weekend CSW informed that patient being seen by psychiatry for altered mental status/bipolar. Per psychiatry notes, patient unable to participate in assessment, psychiatrist states that patient does not have capacity in her current state to make her own medical decisions, son is HCPOA. Patient's chart indicates that patient continues to be disoriented x4. CSW contacted son Harrell Gave to discuss after care plans. CSW introduced self and explained role, son agreeable to speaking. CSW explained PT recommendations of SNF rehab, patient son agreeable. Son Harrell Gave states that patient currently lives alone and he believes that she cannot live independently in her current condition. Son states that he believes his mother has been overdosing on her medications. Son is agreeable to SNF search in [place] and [place]. CSW informed son that Medicare will not pay for long term care or assisted living, CSW provided information on applying for Medicaid. Son has no questions at this time.   Assessment/plan status:  Information/Referral  to [employer] Other assessment/ plan:   Information/referral to community resources:   Weekend CSW will initiate SNF search in [place] and [facility] and follow for discharge planning.    PATIENT'S/FAMILY'S RESPONSE TO PLAN OF CARE: Son Harrell Gave is agreeable to SNF as he believes his mother cannot return home independently in current state. CSW and son spoke briefly about other long term options such as assisted living, son states he is interested in this option but does not know if this is financially feasible. CSW provided information on applying for medicaid. Son has no further questions, but expressed feeling overwhelmed with his mother's medical issues and other family responsibilities. CSW offered support and words of encouragement.     Tilden Fossa, MSW, [place] Clinical Social Worker [facility]. [phone]

## 2013-04-13 LAB — CBC
HCT: 38.1 % (ref 36.0–46.0)
HEMOGLOBIN: 12.9 g/dL (ref 12.0–15.0)
MCH: 31.5 pg (ref 26.0–34.0)
MCHC: 33.9 g/dL (ref 30.0–36.0)
MCV: 93.2 fL (ref 78.0–100.0)
Platelets: 406 10*3/uL — ABNORMAL HIGH (ref 150–400)
RBC: 4.09 MIL/uL (ref 3.87–5.11)
RDW: 14.1 % (ref 11.5–15.5)
WBC: 9.3 10*3/uL (ref 4.0–10.5)

## 2013-04-13 LAB — COMPREHENSIVE METABOLIC PANEL
ALT: 19 U/L (ref 0–35)
AST: 32 U/L (ref 0–37)
Albumin: 3 g/dL — ABNORMAL LOW (ref 3.5–5.2)
Alkaline Phosphatase: 57 U/L (ref 39–117)
BILIRUBIN TOTAL: 0.2 mg/dL — AB (ref 0.3–1.2)
BUN: 13 mg/dL (ref 6–23)
CO2: 27 meq/L (ref 19–32)
CREATININE: 0.88 mg/dL (ref 0.50–1.10)
Calcium: 9.3 mg/dL (ref 8.4–10.5)
Chloride: 98 mEq/L (ref 96–112)
GFR calc Af Amer: 83 mL/min — ABNORMAL LOW (ref >90)
GFR, EST NON AFRICAN AMERICAN: 72 mL/min — AB (ref >90)
GLUCOSE: 114 mg/dL — AB (ref 70–99)
Potassium: 3.4 mEq/L — ABNORMAL LOW (ref 3.7–5.3)
Sodium: 142 mEq/L (ref 137–147)
Total Protein: 6.6 g/dL (ref 6.0–8.3)

## 2013-04-13 LAB — TROPONIN I: Troponin I: 0.49 ng/mL (ref <0.30)

## 2013-04-13 MED ORDER — CLONAZEPAM 1 MG PO TABS: 1.0000 mg | ORAL_TABLET | Freq: Four times a day (QID) | ORAL | Status: DC | PRN

## 2013-04-13 MED ORDER — ALPRAZOLAM 0.5 MG PO TABS: 0.5000 mg | ORAL_TABLET | Freq: Three times a day (TID) | ORAL | Status: DC

## 2013-04-13 MED ORDER — METOPROLOL TARTRATE 25 MG PO TABS: 25.0000 mg | ORAL_TABLET | Freq: Two times a day (BID) | ORAL | Status: DC

## 2013-04-13 NOTE — Progress Notes (Signed)
Pt currently sitting calmly in bed. Restraints discontinued at this time. Will continue to monitor pt.

## 2013-04-13 NOTE — Progress Notes (Signed)
Physical Therapy Treatment Patient Details Name: NADIE FIUMARA MRN: [id number] DOB: [birth year] Today's Date: 04/13/2013    History of Present Illness 58 year old female admitted to [facility] on 04/08/13 with AMS of unclear etiology.  MD thinks that since thre is no underlying pathology that this may be a psych issue mania versus schizophrenia.  Pt with significant PMHx of COPD, bipolar d/o, spinal stenosis (wtih lumbar surgery), HTN, TIA, and ACDF.  She was found to have mildly elevated triponins which MD thinks may be related to her chronic breathing issues, AKI from dehydration, hyponatremia and hypokalemia, as well as metabolic acidosis.  She was recently in the hospital 03/31/13-04/02/13 for a similar dx.      PT Comments    Pt is progressing slowly with her mobility.  The good sign was that she became less assist the more we moved around the room which is encouraging for continued mobility.  She still seems to be confabulating.  She is not physically or cognitively safe to d/c home after her hospital stay.  Recommending SNF for rehab and then inpatient psych after her physical mobility improves?  Follow Up Recommendations  SNF     Equipment Recommendations  None recommended by PT    Recommendations for Other Services   NA      Precautions / Restrictions Precautions Precautions: Fall Precaution Comments: generally unsteady on her feet    Mobility  Bed Mobility Overal bed mobility: Needs Assistance Bed Mobility: Supine to Sit     Supine to sit: Min assist;HOB elevated     General bed mobility comments: min assist to initiate the movement by movign bil legs over EOB and taking both of her hands to get her to pull to sitting.   Transfers Overall transfer level: Needs assistance Equipment used: 2 person hand held assist Transfers: Sit to/from Stand Sit to Stand: Mod assist;+2 physical assistance         General transfer comment: two person mod assist to support trunk during  transition to stand.  Attempted this with the cane, but it was too unsteady.    Ambulation/Gait Ambulation/Gait assistance: Mod assist;+2 physical assistance Ambulation Distance (Feet): 20 Feet Assistive device: 2 person hand held assist Gait Pattern/deviations: Step-through pattern;Shuffle;Staggering right;Staggering left Gait velocity: decreased   General Gait Details: two person mod assist to support trunk for balance during gait.  She did well with bil hand held assist and it was easier to control her this way.  She seemed paranoid about going into the hallway and I was a little worried that we would be unable to get her back into the room if we left, so we walked around the room to the recliner chair.  The further we walked the less assist she needed (at the end two person min assist).            Balance Overall balance assessment: Needs assistance Sitting-balance support: Feet supported Sitting balance-Leahy Scale: Good     Standing balance support: Bilateral upper extremity supported Standing balance-Leahy Scale: Poor                      Cognition Arousal/Alertness: Awake/alert Behavior During Therapy: Anxious Overall Cognitive Status: Impaired/Different from baseline Area of Impairment: Attention;Memory;Following commands;Safety/judgement;Awareness;Problem solving;Orientation Orientation Level: Place;Time;Situation;Disoriented to Current Attention Level: Focused Memory: Decreased short-term memory Following Commands: Follows one step commands inconsistently Safety/Judgement: Decreased awareness of safety;Decreased awareness of deficits Awareness: Intellectual Problem Solving: Slow processing;Decreased initiation;Difficulty sequencing;Requires verbal cues;Requires tactile cues General Comments:  Pt talking about her father and how he wanted a large family and then went into bank robbing.  She needs to constantly be redirected to task and is better at following  commands today, but needs help initiating.      Exercises Other Exercises Other Exercises: attempted seated leg exercises with demonstration, but pt unable to process what I was asking her.  She was moving her legs, but not in the way that I asked.          Pertinent Vitals/Pain See vitals flow sheet.      PT Goals (current goals can now be found in the care plan section) Acute Rehab PT Goals Patient Stated Goal: unable to state Progress towards PT goals: Progressing toward goals    Frequency  Min 2X/week    PT Plan Frequency needs to be updated       End of Session Equipment Utilized During Treatment: Gait belt Activity Tolerance: Patient tolerated treatment well Patient left: in chair;with call bell/phone within reach;with chair alarm set     Time: 1152-1205 PT Time Calculation (min): 13 min  Charges:  $Therapeutic Activity: 8-22 mins                      Kimiya Brunelle B. [place], [place], DPT [phone]   04/13/2013, 4:07 PM

## 2013-04-13 NOTE — Progress Notes (Signed)
Patient is currently active with [facility] for chronic disease management services.  Patient has been engaged by a Teacher, English as a foreign language and LCSW.  Patient will receive a post discharge transition of care call and will be evaluated for monthly home visits for assessments and disease process education.  Made Inpatient Case Manager aware that [facility] following. Of note, [facility] Care Management services does not replace or interfere with any services that are arranged by inpatient case management or social work.  For additional questions or referrals please contact Corliss Blacker BSN RN [facility] Liaison at [phone].

## 2013-04-13 NOTE — Discharge Summary (Signed)
Physician Discharge Summary  Tonya Sullivan OVF:[id number] DOB: Feb 13, 1955 DOA: 04/08/2013  PCP: Vic Blackbird, MD  Admit date: 04/08/2013 Discharge date: 04/14/2013  Time spent: 35 minutes  Recommendations for Outpatient Follow-up:  1. Patient be discharged to skilled nursing facility 2. Her Cozaar has been discontinued secondary to renal dysfunction 3.   Discharge Diagnoses:  Principal Problem:   Acute encephalopathy Active Problems:   COPD (chronic obstructive pulmonary disease)   Tobacco use   Bipolar disorder   Adrenal cortical insufficiency   Hyponatremia   Elevated troponin   Hypokalemia   AKI (acute kidney injury)   Metabolic acidosis   Echolalia   Discharge Condition: Improvement from initial presentation  Diet recommendation: Heart healthy  Filed Weights   04/08/13 2122 04/09/13 0430 04/10/13 2050  Weight: 88.905 kg (196 lb) 89.5 kg (197 lb 5 oz) 67.9 kg (149 lb 11.1 oz)    History of present illness:  On 4/47: 58 year old female with a history of adrenal insufficiency, who was recently discharged on 04/02/13 after she was treated for altered mental status of unclear etiology. At that time patient also had elevated troponin, with maximum levels of 0.45, 2-D echo was normal and showed no wall motion abnormality, cardiology was consulted at that time and they recommended no further intervention as they did not suspect ACS.  Today patient was brought to the hospital again with altered mental status, history is unobtainable. In the ED patient is obtunded, has only grimace with sternal rub, respirations 8-12 per minute, maintaining oxygen saturations above 94 percent on 2 L of nasal cannula, ABG showed pH of 7.415, PCO2 50.0, PO2 71.3  Again patient has elevated troponin of 0. 52, EKG shows nonspecific T wave changes in the anterolateral leads which are improved from previous EKG. Patient also received orders of Narcan, with no significant effect on mentation. She also  received stress dose of Solu Cortef in the ED.   Hospital Course:  Principal Problem:  Acute encephalopathy/altered sensorium: Unclear etiology. Given the overall negative labs, imaging and drug screen, her presentation may be more consistent with mental health issues possibly mania versus schizophrenia. Have restarted her home medications for depression and have asked psychiatry to see. Noted urine drug screen positive for benzos, but not opiates. Have DC'd fentanyl patch. At this time, I do not think the patient is able to care for herself in the short or long-term setting. Have spoken with the patient's mother who tells me that patient is often forgetful, but on a good day, she is able to interact well but she does have chronic issues and often talks to herself. Patient has been having echolalia and word perseverance. His findings can be seen in patients with schizophrenia or dementia or Tourette's.  Interestingly, she was not cleared to eat because of concerns of confusion. But actually despite word perseverance and repetition, she was able to follow commands when asked her to take a deep breath during her exam and did not talk when I listened to her heart beat. In addition, when I actually named food and touch her stomach to indicate eating, she did repeat the word yes over and over. At that point, in ordering a meal tray with close supervision and coaching, the patient actually did very well with eating. I suspected this is not in acute delirium and may be more of underlying chronic mental illness or early dementia. We'll plan to try to adjust home medications with meals  Because she does talks nonsensically, psychiatry  unable to diagnose her. We'll discuss with psychiatry about adjustments of medications and looking at adding antipsychotic and perhaps decreasing SSRI. Have added scheduled low-dose Xanax 3 times a day with meals given agitation episode yesterday.  This has greatly helped patient's  mentation. In review of all of her medications, I suspect that she likely had problems with either benzo overdose versus severe withdrawals but has led to her psychosis. Either way, the family and I agree that she can take care of herself. She placement. Have been able to restart her psychiatric medications +3 times a day Xanax which has helped her mentation and off without leading to concerns for over this possibility. Active Problems:  COPD (chronic obstructive pulmonary disease): Looks to be stable. Oxygen levels normal.      Bipolar disorder: ? Diagnosis. Patient has been following with a psychiatrist as outpatient. Her mother states that she does have depression and suspect that she likely has bipolar as that runs in the family. See above.   Adrenal cortical insufficiency: On stress dose steroids, tapered down to home dose starting 4/13  Hyponatremia: Sodium for now normal.   Elevated troponin: Minimal elevation, which may be more related to chronic lung issues. We'll continue to cycle enzymes, slowly trending down. Persistent and noted that previous hospitalization as well. Cardiology does not feel this is related to ACS. Hypokalemia: Replacing as needed AKI (acute kidney injury): Secondary to dehydration? With hydration, creatinine normalized on 4/9.  ARB probably discontinued  Metabolic acidosis: Followup labs on 4/9 noted anion gap of 18. Continues to remain mildly elevated. Suspect this could be starvation ketosis. Do not really have any other cause Ordered stat lactic acid level and salicylate level, both of which are normal. Repeat labs on 4/10 at 16   Procedures:  None  Consultations:  Psychiatry  Discharge Exam: Filed Vitals:   04/13/13 1608  BP:   Pulse: 90  Temp:   Resp:     General: Alert and oriented x2, a bit more appropriate today Cardiovascular: Regular rate and rhythm, S1-S2 Respiratory: Decreased breath sounds throughout  Discharge Instructions You were  cared for by a hospitalist during your hospital stay. If you have any questions about your discharge medications or the care you received while you were in the hospital after you are discharged, you can call the unit and asked to speak with the hospitalist on call if the hospitalist that took care of you is not available. Once you are discharged, your primary care physician will handle any further medical issues. Please note that NO REFILLS for any discharge medications will be authorized once you are discharged, as it is imperative that you return to your primary care physician (or establish a relationship with a primary care physician if you do not have one) for your aftercare needs so that they can reassess your need for medications and monitor your lab values.   Future Appointments Provider Department Dept Phone   04/17/2013 2:15 PM Alycia Rossetti, MD [place] [phone]   05/04/2013 10:45 AM Ralene Ok, MD [facility], [place] [phone]       Medication List    STOP taking these medications       fluocinonide gel 0.05 %  Commonly known as:  LIDEX     HYDROcodone-acetaminophen 7.5-325 mg/15 ml solution  Commonly known as:  HYCET     lidocaine 2 % solution  Commonly known as:  XYLOCAINE     lidocaine 5 %  Commonly  known as:  LIDODERM     losartan 50 MG tablet  Commonly known as:  COZAAR     ondansetron 4 MG disintegrating tablet  Commonly known as:  ZOFRAN-ODT     oxyCODONE-acetaminophen 10-325 MG per tablet  Commonly known as:  PERCOCET     pantoprazole 20 MG tablet  Commonly known as:  PROTONIX     propranolol 10 MG tablet  Commonly known as:  INDERAL     SUBOXONE 8-2 MG Film  Generic drug:  Buprenorphine HCl-Naloxone HCl      TAKE these medications       albuterol 108 (90 BASE) MCG/ACT inhaler  Commonly known as:  PROVENTIL HFA;VENTOLIN HFA  Inhale 2 puffs into the lungs 3 (three) times daily as needed for wheezing or shortness  of breath.     albuterol (2.5 MG/3ML) 0.083% nebulizer solution  Commonly known as:  PROVENTIL  Take 2.5 mg by nebulization every 4 (four) hours as needed for wheezing.     ALPRAZolam 0.5 MG tablet  Commonly known as:  XANAX  Take 1 tablet (0.5 mg total) by mouth 3 (three) times daily.     amitriptyline 100 MG tablet  Commonly known as:  ELAVIL  Take 1 tablet (100 mg total) by mouth at bedtime.     baclofen 10 MG tablet  Commonly known as:  LIORESAL  Take 10 mg by mouth 2 (two) times daily.     clonazePAM 1 MG tablet  Commonly known as:  KLONOPIN  Take 1 tablet (1 mg total) by mouth 4 (four) times daily as needed for anxiety.     DULoxetine 60 MG capsule  Commonly known as:  CYMBALTA  Take 1 capsule (60 mg total) by mouth every morning.     escitalopram 20 MG tablet  Commonly known as:  LEXAPRO  Take 1 tablet (20 mg total) by mouth every morning.     hydrochlorothiazide 25 MG tablet  Commonly known as:  HYDRODIURIL  Take 25 mg by mouth every morning.     metoprolol tartrate 25 MG tablet  Commonly known as:  LOPRESSOR  Take 1 tablet (25 mg total) by mouth 2 (two) times daily with a meal.     oxybutynin 10 MG 24 hr tablet  Commonly known as:  DITROPAN XL  Take 1 tablet (10 mg total) by mouth daily.     pravastatin 40 MG tablet  Commonly known as:  PRAVACHOL  Take 1 tablet (40 mg total) by mouth every evening.     predniSONE 2.5 MG tablet  Commonly known as:  DELTASONE  Take 3 tablets (7.5 mg total) by mouth daily with breakfast.     tiotropium 18 MCG inhalation capsule  Commonly known as:  SPIRIVA  Place 1 capsule (18 mcg total) into inhaler and inhale daily. USES IN THE EVENING       Allergies  Allergen Reactions  . Codeine Itching  . Cyclobenzaprine Hives  . Darvocet [Propoxyphene N-Acetaminophen] Rash  . Abilify [Aripiprazole] Other (See Comments)    tremors  . Ace Inhibitors   . Adhesive [Tape] Hives  . Gabapentin Swelling  . Iodine Hives  .  Levofloxacin Hives and Hypertension  . Sulfa Antibiotics Itching  . Robaxin [Methocarbamol] Rash      The results of significant diagnostics from this hospitalization (including imaging, microbiology, ancillary and laboratory) are listed below for reference.    Significant Diagnostic Studies: Ct Head Wo Contrast  04/09/2013   CLINICAL DATA:  Lethargy.  EXAM: CT HEAD WITHOUT CONTRAST  TECHNIQUE: Contiguous axial images were obtained from the base of the skull through the vertex without intravenous contrast.  COMPARISON:  04/01/2013  FINDINGS: Skull and Sinuses:No significant abnormality.  Orbits: No acute abnormality.  Brain: No evidence of acute abnormality, such as acute infarction, hemorrhage, hydrocephalus, or mass lesion/mass effect. Prominent bifrontal extra-axial spaces consistent with cerebral volume loss- age advanced.  IMPRESSION: 1. No acute intracranial abnormality. 2. Brain atrophy.   Electronically Signed   By: Jorje Guild M.D.   On: 04/09/2013 00:32   Ct Head Wo Contrast  04/01/2013   CLINICAL DATA:  Altered mental status, slurred speech.  EXAM: CT HEAD WITHOUT CONTRAST  TECHNIQUE: Contiguous axial images were obtained from the base of the skull through the vertex without intravenous contrast.  COMPARISON:  CT HEAD W/O CM dated 03/14/2013  FINDINGS: Mild ventriculomegaly, likely on the basis of global parenchymal brain volume loss as there is overall commensurate enlargement of cerebral sulci and cerebellar folia, somewhat prominent for age though, stable. Mildly prominent bifrontal extra-axial spaces suggests underlying parenchymal brain volume loss. No intraparenchymal hemorrhage, mass effect nor midline shift. No acute large vascular territory infarcts.  No abnormal extra-axial fluid collections. Basal cisterns are patent.  No skull fracture. Visualized paranasal sinuses and mastoid air-cells are well-aerated. The included ocular globes and orbital contents are non-suspicious. Soft  tissue within the right greater than left external auditory canals likely reflects cerumen.  IMPRESSION: No acute intracranial process.  Mild parenchymal brain volume loss, advanced for age though stable from prior examination.   Electronically Signed   By: Elon Alas   On: 04/01/2013 02:55   Ct Soft Tissue Neck W Contrast  03/27/2013   CLINICAL DATA:  Recent achalasia surgery, feeling throat closing.  EXAM: CT NECK AND CHEST WITH CONTRAST  TECHNIQUE: Multidetector CT imaging of the neck and chest was performed using the standard protocol following the bolus administration of intravenous contrast.  CONTRAST:  128mL OMNIPAQUE IOHEXOL 300 MG/ML  SOLN  COMPARISON:  CT CHEST W/O CM dated 08/02/2012  FINDINGS: CT neck: Aerodigestive tract is unremarkable for this mildly motion degraded examination. Preservation of the parapharyngeal fat tissue planes. Normal appearance of the major salivary glands. Normal appearance of the thyroid gland which extends posteriorly, a normal variant.  Somewhat medialized course of the common carotid arteries, a normal variant with mild calcific atherosclerosis of the carotid bifurcations. No lymphadenopathy by CT size criteria. No free fluid or fluid collections within the neck.  Patient is edentulous. Visualized paranasal sinuses and mastoid air cells are well aerated. Broad reversed cervical lordosis, status post C6-7 ACDF.  CT chest: Minimally patulous thoracic esophagus, with minimal debris, no suspicious esophageal distension, no air fluid levels or posterior mediastinal free air/fluid collections.  Heart and pericardium overall normal, with mild Coronary artery calcifications. Mild calcific atherosclerosis of the thoracic aorta which normal in course and caliber. Tracheobronchial tree is patent and midline. No pneumothorax. Linear atelectasis versus scarring in the right upper/middle lobe. Resolution of tree-in-bud airspace opacities seen on prior CT. 7 mm right upper lobe  pulmonary nodule, coronal 60/92, axial 21/61. No pleural effusions or focal consolidations.  Included view of the abdomen demonstrates cholecystectomy. Soft tissues and osseous structures are nonsuspicious. Straightened thoracic kyphosis with mild degenerative change.  IMPRESSION: CT neck: No acute process within the neck, normal appearance of the aerodigestive tract.  CT chest: Minimally patulous thoracic esophagus, with no suspicious esophageal distention or air-fluid levels. No posterior mediastinal  fluid collections or masses.  If symptoms persist, dynamic study status upper GI may be of added value.  No acute cardiopulmonary process.  7 mm right upper lobe pulmonary nodule: If the patient is at high risk for bronchogenic carcinoma, follow-up chest CT at 3-39months is recommended. If the patient is at low risk for bronchogenic carcinoma, follow-up chest CT at 6-12 months is recommended. This recommendation follows the consensus statement: Guidelines for Management of Small Pulmonary Nodules Detected on CT Scans: A Statement from the [place] as published in Radiology 2005; 237:395-400.   Electronically Signed   By: Elon Alas   On: 03/27/2013 16:48   Ct Chest W Contrast  03/27/2013   CLINICAL DATA:  Recent achalasia surgery, feeling throat closing.  EXAM: CT NECK AND CHEST WITH CONTRAST  TECHNIQUE: Multidetector CT imaging of the neck and chest was performed using the standard protocol following the bolus administration of intravenous contrast.  CONTRAST:  174mL OMNIPAQUE IOHEXOL 300 MG/ML  SOLN  COMPARISON:  CT CHEST W/O CM dated 08/02/2012  FINDINGS: CT neck: Aerodigestive tract is unremarkable for this mildly motion degraded examination. Preservation of the parapharyngeal fat tissue planes. Normal appearance of the major salivary glands. Normal appearance of the thyroid gland which extends posteriorly, a normal variant.  Somewhat medialized course of the common carotid arteries, a normal  variant with mild calcific atherosclerosis of the carotid bifurcations. No lymphadenopathy by CT size criteria. No free fluid or fluid collections within the neck.  Patient is edentulous. Visualized paranasal sinuses and mastoid air cells are well aerated. Broad reversed cervical lordosis, status post C6-7 ACDF.  CT chest: Minimally patulous thoracic esophagus, with minimal debris, no suspicious esophageal distension, no air fluid levels or posterior mediastinal free air/fluid collections.  Heart and pericardium overall normal, with mild Coronary artery calcifications. Mild calcific atherosclerosis of the thoracic aorta which normal in course and caliber. Tracheobronchial tree is patent and midline. No pneumothorax. Linear atelectasis versus scarring in the right upper/middle lobe. Resolution of tree-in-bud airspace opacities seen on prior CT. 7 mm right upper lobe pulmonary nodule, coronal 60/92, axial 21/61. No pleural effusions or focal consolidations.  Included view of the abdomen demonstrates cholecystectomy. Soft tissues and osseous structures are nonsuspicious. Straightened thoracic kyphosis with mild degenerative change.  IMPRESSION: CT neck: No acute process within the neck, normal appearance of the aerodigestive tract.  CT chest: Minimally patulous thoracic esophagus, with no suspicious esophageal distention or air-fluid levels. No posterior mediastinal fluid collections or masses.  If symptoms persist, dynamic study status upper GI may be of added value.  No acute cardiopulmonary process.  7 mm right upper lobe pulmonary nodule: If the patient is at high risk for bronchogenic carcinoma, follow-up chest CT at 3-60months is recommended. If the patient is at low risk for bronchogenic carcinoma, follow-up chest CT at 6-12 months is recommended. This recommendation follows the consensus statement: Guidelines for Management of Small Pulmonary Nodules Detected on CT Scans: A Statement from the [place]  as published in Radiology 2005; 237:395-400.   Electronically Signed   By: Elon Alas   On: 03/27/2013 16:48   Mr Brain Wo Contrast  04/01/2013   CLINICAL DATA:  Encephalopathy  EXAM: MRI HEAD WITHOUT CONTRAST  TECHNIQUE: Multiplanar, multiecho pulse sequences of the brain and surrounding structures were obtained without intravenous contrast.  COMPARISON:  CT 04/01/2013, MRI 08/03/2012  FINDINGS: Cerebral volume is normal for age. Ventricle size is normal. Craniocervical junction is normal. Pituitary is normal in size.  Negative for acute infarct. No significant chronic ischemia. Negative for hemorrhage or mass. No shift of the midline structures.  Paranasal sinuses are clear.  IMPRESSION: Negative MRI of the brain.   Electronically Signed   By: Franchot Gallo M.D.   On: 04/01/2013 13:44   Dg Chest Port 1 View  04/09/2013   CLINICAL DATA:  Respiratory distress.  EXAM: PORTABLE CHEST - 1 VIEW  COMPARISON:  DG CHEST 1V PORT dated 04/01/2013  FINDINGS: The cardiac silhouette appears mild to moderately enlarged, similar. Mediastinal silhouette is nonsuspicious, unchanged. Stable mild interstitial prominence with strandy densities in right lung base. No pleural effusions. Trachea projects midline and there is no pneumothorax.  ACDF. Multiple EKG lines overlie the patient and may obscure subtle underlying pathology.  IMPRESSION: Stable cardiomegaly and chronic interstitial changes with right lung base strandy densities likely reflecting atelectasis.   Electronically Signed   By: Elon Alas   On: 04/09/2013 02:57   Dg Chest Port 1 View  04/01/2013   CLINICAL DATA:  Evaluate for pulmonary infiltrates, weakness  EXAM: PORTABLE CHEST - 1 VIEW  COMPARISON:  CT CHEST W/CM dated 03/27/2013; DG CHEST 1V PORT dated 10/25/2012; DG CHEST 2 VIEW dated 10/13/2012  FINDINGS: The examination is degraded due to patient body habitus and portable technique.  Grossly unchanged cardiac silhouette and mediastinal contours  given decreased lung volumes and patient rotation. There is persistent thickening of the right peritracheal stripe which is favored to be secondary to prominent vasculature. Grossly unchanged bibasilar heterogeneous opacities. Trace bilateral effusions are not excluded. Mild cephalization of flow without frank evidence of edema. No definite pneumothorax. Grossly unchanged bones including lower cervical ACDF.  IMPRESSION: Degraded examination with similar findings of bibasilar atelectasis/ scar. Further evaluation with a PA and lateral chest radiograph may be obtained as clinically indicated.   Electronically Signed   By: Sandi Mariscal M.D.   On: 04/01/2013 08:39   Dg Esophagus W/water Sol Cm  03/18/2013   CLINICAL DATA:  Postoperative for Heller myotomy.  EXAM: Water-soluble esophagram  TECHNIQUE: Single contrast examination was performed using water-soluble contrast medium.  FLUOROSCOPY TIME:  0 min, 55 seconds  COMPARISON:  DG CERVICAL SPINE 1V dated 11/13/2012; DG ESOPHAGUS dated 11/05/2012  FINDINGS: No leak in the distal esophagus or proximal stomach identified. Tertiary esophageal contractions were noted. No complicating feature related to Heller myotomy. I obtained frontal and oblique views to fully depict the distal esophageal barium column. No definite stricture.  IMPRESSION: 1. No leak after Heller myotomy.   Electronically Signed   By: Sherryl Barters M.D.   On: 03/18/2013 13:08    Microbiology: Recent Results (from the past 240 hour(s))  MRSA PCR SCREENING     Status: None   Collection Time    04/09/13  4:39 AM      Result Value Ref Range Status   MRSA by PCR NEGATIVE  NEGATIVE Final   Comment:            The GeneXpert MRSA Assay (FDA     approved for NASAL specimens     only), is one component of a     comprehensive MRSA colonization     surveillance program. It is not     intended to diagnose MRSA     infection nor to guide or     monitor treatment for     MRSA infections.      Labs: Basic Metabolic Panel:  Recent Labs Lab 04/09/13 0500 04/09/13 1307 04/10/13  0150 04/11/13 1404 04/13/13 0656  NA 131* 130* 134* 136* 142  K 4.2 4.4 2.9* 3.4* 3.4*  CL 87* 88* 90* 94* 98  CO2 25 26 28 25 27   GLUCOSE 125* 114* 92 102* 114*  BUN 10 9 8 13 13   CREATININE 0.88 0.80 0.73 0.84 0.88  CALCIUM 9.1 9.0 9.0 9.1 9.3   Liver Function Tests:  Recent Labs Lab 04/08/13 2235 04/09/13 0500 04/10/13 0150 04/13/13 0656  AST 27 34 19 32  ALT 21 18 16 19   ALKPHOS 73 62 63 57  BILITOT 0.3 0.3 0.3 0.2*  PROT 7.7 6.8 6.7 6.6  ALBUMIN 3.7 3.1* 3.1* 3.0*   No results found for this basename: LIPASE, AMYLASE,  in the last 168 hours  Recent Labs Lab 04/08/13 2256 04/10/13 0147  AMMONIA 31 21   CBC:  Recent Labs Lab 04/08/13 2235 04/09/13 0500 04/10/13 0150 04/11/13 1404 04/13/13 0656  WBC 8.1 4.5 9.1 11.4* 9.3  NEUTROABS 4.6  --   --   --   --   HGB 12.1 12.0 12.0 13.1 12.9  HCT 35.2* 34.5* 34.8* 38.0 38.1  MCV 92.1 92.7 92.1 93.8 93.2  PLT 366 370 369 402* 406*   Cardiac Enzymes:  Recent Labs Lab 04/09/13 2140 04/10/13 0150 04/10/13 0903 04/11/13 1404 04/13/13 0656  TROPONINI 0.47* 0.50* 0.48* 0.47* 0.49*   BNP: BNP (last 3 results)  Recent Labs  08/03/12 0850 10/25/12 0930  PROBNP 745.1* 209.0*   CBG:  Recent Labs Lab 04/08/13 2225  GLUCAP 100*       Signed:  Chung Chagoya K Seanpaul Preece  [facility] 04/13/2013, 9:37 PM    Get a mini lap bilaterally

## 2013-04-14 ENCOUNTER — Encounter (INDEPENDENT_AMBULATORY_CARE_PROVIDER_SITE_OTHER): Payer: Self-pay

## 2013-04-14 DIAGNOSIS — F319 Bipolar disorder, unspecified: Secondary | ICD-10-CM

## 2013-04-14 MED ORDER — HYDROCODONE-ACETAMINOPHEN 5-325 MG PO TABS
1.0000 | ORAL_TABLET | Freq: Once | ORAL | Status: AC
  Administered 2013-04-14: 1 via ORAL
  Filled 2013-04-14: qty 1

## 2013-04-14 NOTE — Clinical Social Work Note (Signed)
[facility] states that they cannot accept patient due to "open claims" that patient has with insurance companies from an incident in 2006. CSW contact facilities that made bed offers and [facility] has stated that they will admit patient today. CSW will DC patient to [facility]. RN given number for report. Patient's son informed of DC and given information about facility. Ambulance transport requested for patient. CSW signing off at this time.  Liz Beach MSW, [place], [place], [phone]

## 2013-04-14 NOTE — Progress Notes (Signed)
Tonya Sullivan discharged Skilled nursing facility per MD order.  Report called to receiving Tonya Plover, Tonya Sullivan at [facility].     Medication List    STOP taking these medications       fluocinonide gel 0.05 %  Commonly known as:  LIDEX     HYDROcodone-acetaminophen 7.5-325 mg/15 ml solution  Commonly known as:  HYCET     lidocaine 2 % solution  Commonly known as:  XYLOCAINE     lidocaine 5 %  Commonly known as:  LIDODERM     losartan 50 MG tablet  Commonly known as:  COZAAR     ondansetron 4 MG disintegrating tablet  Commonly known as:  ZOFRAN-ODT     oxyCODONE-acetaminophen 10-325 MG per tablet  Commonly known as:  PERCOCET     pantoprazole 20 MG tablet  Commonly known as:  PROTONIX     propranolol 10 MG tablet  Commonly known as:  INDERAL     SUBOXONE 8-2 MG Film  Generic drug:  Buprenorphine HCl-Naloxone HCl      TAKE these medications       albuterol 108 (90 BASE) MCG/ACT inhaler  Commonly known as:  PROVENTIL HFA;VENTOLIN HFA  Inhale 2 puffs into the lungs 3 (three) times daily as needed for wheezing or shortness of breath.     albuterol (2.5 MG/3ML) 0.083% nebulizer solution  Commonly known as:  PROVENTIL  Take 2.5 mg by nebulization every 4 (four) hours as needed for wheezing.     ALPRAZolam 0.5 MG tablet  Commonly known as:  XANAX  Take 1 tablet (0.5 mg total) by mouth 3 (three) times daily.     amitriptyline 100 MG tablet  Commonly known as:  ELAVIL  Take 1 tablet (100 mg total) by mouth at bedtime.     baclofen 10 MG tablet  Commonly known as:  LIORESAL  Take 10 mg by mouth 2 (two) times daily.     clonazePAM 1 MG tablet  Commonly known as:  KLONOPIN  Take 1 tablet (1 mg total) by mouth 4 (four) times daily as needed for anxiety.     DULoxetine 60 MG capsule  Commonly known as:  CYMBALTA  Take 1 capsule (60 mg total) by mouth every morning.     escitalopram 20 MG tablet  Commonly known as:  LEXAPRO  Take 1 tablet (20 mg  total) by mouth every morning.     hydrochlorothiazide 25 MG tablet  Commonly known as:  HYDRODIURIL  Take 25 mg by mouth every morning.     metoprolol tartrate 25 MG tablet  Commonly known as:  LOPRESSOR  Take 1 tablet (25 mg total) by mouth 2 (two) times daily with a meal.     oxybutynin 10 MG 24 hr tablet  Commonly known as:  DITROPAN XL  Take 1 tablet (10 mg total) by mouth daily.     pravastatin 40 MG tablet  Commonly known as:  PRAVACHOL  Take 1 tablet (40 mg total) by mouth every evening.     predniSONE 2.5 MG tablet  Commonly known as:  DELTASONE  Take 3 tablets (7.5 mg total) by mouth daily with breakfast.     tiotropium 18 MCG inhalation capsule  Commonly known as:  SPIRIVA  Place 1 capsule (18 mcg total) into inhaler and inhale daily. USES IN THE EVENING        Patients skin is clean, dry and intact, no evidence of skin break down, bruises to arms &.abdomen.  IV site discontinued and catheter remains intact. Site without signs and symptoms of complications. Dressing and pressure applied.  Patient to be transported on a stretcher by non emergent EMS,  no distress noted upon discharge.  [name] [name] 04/14/2013 4:25 PM

## 2013-04-14 NOTE — Clinical Social Work Note (Signed)
Ambulance transport pushed back to 4:45pm for patient.  Liz Beach MSW, [place], [place], [phone]

## 2013-04-14 NOTE — Care Management Note (Addendum)
    Page 1 of 1   04/14/2013     11:02:12 AM   CARE MANAGEMENT NOTE 04/14/2013  Patient:  Tonya Sullivan, Tonya Sullivan   Account Number:  [id number]  Date Initiated:  04/10/2013  Documentation initiated by:  Marvetta Gibbons  Subjective/Objective Assessment:   Pt admitted with AMS Acute encephalopathy: Unclear etiology     Action/Plan:   PTA pt lived  at home alone- Psychiatry consulted   Anticipated DC Date:  04/14/2013   Anticipated DC Plan:  SKILLED NURSING FACILITY  In-house referral  Clinical Social Worker      DC Planning Services  CM consult      Choice offered to / List presented to:             Status of service:  Completed, signed off Medicare Important Message given?   (If response is "NO", the following Medicare IM given date fields will be blank) Date Medicare IM given:   Date Additional Medicare IM given:    Discharge Disposition:  [place]  Per UR Regulation:  Reviewed for med. necessity/level of care/duration of stay  If discussed at [place] of Stay Meetings, dates discussed:    Comments:

## 2013-04-14 NOTE — Progress Notes (Signed)
[facility] PROGRESS NOTE  Tonya Sullivan VQX:[id number] DOB: May 28, 1955 DOA: 04/08/2013 PCP: Vic Blackbird, MD  Assessment/Plan:    Acute encephalopathy/altered sensorium: Unclear etiology. Given the overall negative labs, imaging and drug screen, her presentation may be more consistent with mental health issues possibly mania versus schizophrenia. Have restarted her home medications for depression and have asked psychiatry to see. Noted urine drug screen positive for benzos, but not opiates. Have DC'd fentanyl patch.   -patient is not able to care for herself in the short or long-term setting. H -Patient has been having echolalia and word perseverance. findings can be seen in patients with schizophrenia or dementia or Tourette's.   Because she does talks nonsensically, psychiatry unable to diagnose her.  -scheduled low-dose Xanax 3 times a day with meals given agitation episode yesterday     COPD (chronic obstructive pulmonary disease): Looks to be stable. Oxygen levels normal.    Tobacco use: Patient not on patch    Bipolar disorder: ? Diagnosis. Patient has been following with a psychiatrist as outpatient. Her mother states that she does have depression and suspect that she likely has bipolar as that runs in the family.     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    Elevated troponin: Minimal elevation, which may be more related to chronic lung issues. We'll continue to cycle enzymes, slowly trending down.    Hypokalemia    AKI (acute kidney injury): Secondary to dehydration? With hydration, creatinine normalized on 4/9    Metabolic acidosis: Followup labs on 4/9 noted anion gap of 18.   Code Status:  full code Family Communication: son by phone 4/12 Disposition Plan: SNF  Consultants:  Psychiatry  Procedures:  None  Antibiotics:  None  HPI/Subjective No  SOB No CP Does not know where she is or why she is here  Objective: Filed Vitals:   04/14/13 0511  BP: 154/92  Pulse:   Temp: 98.4 F (36.9 C)  Resp: 18    Intake/Output Summary (Last 24 hours) at 04/14/13 0743 Last data filed at 04/14/13 0420  Gross per 24 hour  Intake      0 ml  Output   1400 ml  Net  -1400 ml   Filed Weights   04/08/13 2122 04/09/13 0430 04/10/13 2050  Weight: 88.905 kg (196 lb) 89.5 kg (197 lb 5 oz) 67.9 kg (149 lb 11.1 oz)    Exam:   General:  Awake, interactive.  follow commands- does not answer appropriately  Cardiovascular: Regular rate and rhythm  Respiratory: Decreased breath sounds throughout  Abdomen: Soft, nontender, nondistended positive bowel sounds  Musculoskeletal: No clubbing or cyanosis, trace edema  Neuro: No focal deficits   Data Reviewed: Basic Metabolic Panel:  Recent Labs Lab 04/09/13 0500 04/09/13 1307 04/10/13 0150 04/11/13 1404 04/13/13 0656  NA 131* 130* 134* 136* 142  K 4.2 4.4 2.9* 3.4* 3.4*  CL 87* 88* 90* 94* 98  CO2 25 26 28 25 27   GLUCOSE 125* 114* 92 102* 114*  BUN 10 9 8 13 13   CREATININE 0.88 0.80 0.73 0.84 0.88  CALCIUM 9.1 9.0 9.0 9.1 9.3   Liver Function Tests:  Recent Labs Lab 04/08/13 2235 04/09/13 0500 04/10/13 0150 04/13/13 0656  AST 27 34 19 32  ALT 21 18 16 19   ALKPHOS 73 62 63 57  BILITOT 0.3 0.3 0.3 0.2*  PROT 7.7 6.8 6.7  6.6  ALBUMIN 3.7 3.1* 3.1* 3.0*   No results found for this basename: LIPASE, AMYLASE,  in the last 168 hours  Recent Labs Lab 04/08/13 2256 04/10/13 0147  AMMONIA 31 21   CBC:  Recent Labs Lab 04/08/13 2235 04/09/13 0500 04/10/13 0150 04/11/13 1404 04/13/13 0656  WBC 8.1 4.5 9.1 11.4* 9.3  NEUTROABS 4.6  --   --   --   --   HGB 12.1 12.0 12.0 13.1 12.9  HCT 35.2* 34.5* 34.8* 38.0 38.1  MCV 92.1 92.7 92.1 93.8 93.2  PLT 366 370 369 402* 406*   Cardiac Enzymes:  Recent Labs Lab 04/09/13 2140 04/10/13 0150 04/10/13 0903  04/11/13 1404 04/13/13 0656  TROPONINI 0.47* 0.50* 0.48* 0.47* 0.49*   BNP (last 3 results)  Recent Labs  08/03/12 0850 10/25/12 0930  PROBNP 745.1* 209.0*   CBG:  Recent Labs Lab 04/08/13 2225  GLUCAP 100*    Recent Results (from the past 240 hour(s))  MRSA PCR SCREENING     Status: None   Collection Time    04/09/13  4:39 AM      Result Value Ref Range Status   MRSA by PCR NEGATIVE  NEGATIVE Final   Comment:            The GeneXpert MRSA Assay (FDA     approved for NASAL specimens     only), is one component of a     comprehensive MRSA colonization     surveillance program. It is not     intended to diagnose MRSA     infection nor to guide or     monitor treatment for     MRSA infections.     Studies: No results found.  Scheduled Meds: . ALPRAZolam  0.25 mg Oral TID WC  . amitriptyline  100 mg Oral Q supper  . antiseptic oral rinse  15 mL Mouth Rinse q12n4p  . chlorhexidine  15 mL Mouth Rinse BID  . DULoxetine  60 mg Oral Q breakfast  . enoxaparin (LOVENOX) injection  40 mg Subcutaneous Daily  . escitalopram  20 mg Oral Q breakfast  . metoprolol tartrate  25 mg Oral BID WC  . predniSONE  7.5 mg Oral Q breakfast   Continuous Infusions:    Principal Problem:   Acute encephalopathy Active Problems:   COPD (chronic obstructive pulmonary disease)   Tobacco use   Bipolar disorder   Adrenal cortical insufficiency   Hyponatremia   Elevated troponin   Hypokalemia   AKI (acute kidney injury)   Metabolic acidosis    Time spent: 25 minutes    [facility] Pager [phone] 7AM-7AM, please contact night-coverage at [email or web address], password [facility] 04/14/2013, 7:43 AM  LOS: 6 days

## 2013-04-17 ENCOUNTER — Ambulatory Visit (INDEPENDENT_AMBULATORY_CARE_PROVIDER_SITE_OTHER): Payer: Medicare Other | Admitting: Family Medicine

## 2013-04-17 ENCOUNTER — Encounter: Payer: Self-pay | Admitting: Family Medicine

## 2013-04-17 VITALS — BP 138/74 | HR 84 | Temp 98.0°F | Resp 16 | Ht 65.0 in | Wt 185.0 lb

## 2013-04-17 DIAGNOSIS — H6120 Impacted cerumen, unspecified ear: Secondary | ICD-10-CM

## 2013-04-17 DIAGNOSIS — R4182 Altered mental status, unspecified: Secondary | ICD-10-CM

## 2013-04-17 DIAGNOSIS — M549 Dorsalgia, unspecified: Secondary | ICD-10-CM

## 2013-04-17 DIAGNOSIS — N179 Acute kidney failure, unspecified: Secondary | ICD-10-CM

## 2013-04-17 DIAGNOSIS — E2740 Unspecified adrenocortical insufficiency: Secondary | ICD-10-CM

## 2013-04-17 DIAGNOSIS — I6529 Occlusion and stenosis of unspecified carotid artery: Secondary | ICD-10-CM

## 2013-04-17 DIAGNOSIS — G8929 Other chronic pain: Secondary | ICD-10-CM

## 2013-04-17 DIAGNOSIS — E2749 Other adrenocortical insufficiency: Secondary | ICD-10-CM

## 2013-04-17 DIAGNOSIS — F319 Bipolar disorder, unspecified: Secondary | ICD-10-CM

## 2013-04-17 LAB — CBC WITH DIFFERENTIAL/PLATELET
Basophils Absolute: 0 10*3/uL (ref 0.0–0.1)
Basophils Relative: 0 % (ref 0–1)
EOS ABS: 0.2 10*3/uL (ref 0.0–0.7)
Eosinophils Relative: 3 % (ref 0–5)
HCT: 39.2 % (ref 36.0–46.0)
Hemoglobin: 13.2 g/dL (ref 12.0–15.0)
LYMPHS ABS: 1.3 10*3/uL (ref 0.7–4.0)
Lymphocytes Relative: 19 % (ref 12–46)
MCH: 31.1 pg (ref 26.0–34.0)
MCHC: 33.7 g/dL (ref 30.0–36.0)
MCV: 92.2 fL (ref 78.0–100.0)
Monocytes Absolute: 0.4 10*3/uL (ref 0.1–1.0)
Monocytes Relative: 6 % (ref 3–12)
Neutro Abs: 5.1 10*3/uL (ref 1.7–7.7)
Neutrophils Relative %: 72 % (ref 43–77)
PLATELETS: 402 10*3/uL — AB (ref 150–400)
RBC: 4.25 MIL/uL (ref 3.87–5.11)
RDW: 14 % (ref 11.5–15.5)
WBC: 7.1 10*3/uL (ref 4.0–10.5)

## 2013-04-17 MED ORDER — OXYCODONE-ACETAMINOPHEN 5-325 MG PO TABS: 1.0000 | ORAL_TABLET | Freq: Two times a day (BID) | ORAL | Status: DC | PRN

## 2013-04-17 MED ORDER — CARBAMIDE PEROXIDE 6.5 % OT SOLN
5.0000 [drp] | Freq: Two times a day (BID) | OTIC | Status: DC
Start: 2013-04-17 — End: 2013-04-22

## 2013-04-17 NOTE — Patient Instructions (Signed)
Stop the xanax Continue Klonopin four times a day 12 percocet given until your appt on the 22nd  F/U 1 month

## 2013-04-18 DIAGNOSIS — H6120 Impacted cerumen, unspecified ear: Secondary | ICD-10-CM | POA: Insufficient documentation

## 2013-04-18 LAB — COMPREHENSIVE METABOLIC PANEL
ALT: 24 U/L (ref 0–35)
AST: 21 U/L (ref 0–37)
Albumin: 4 g/dL (ref 3.5–5.2)
Alkaline Phosphatase: 60 U/L (ref 39–117)
BILIRUBIN TOTAL: 0.3 mg/dL (ref 0.2–1.2)
BUN: 11 mg/dL (ref 6–23)
CO2: 32 mEq/L (ref 19–32)
Calcium: 9.4 mg/dL (ref 8.4–10.5)
Chloride: 93 mEq/L — ABNORMAL LOW (ref 96–112)
Creat: 0.92 mg/dL (ref 0.50–1.10)
Glucose, Bld: 94 mg/dL (ref 70–99)
Potassium: 4.3 mEq/L (ref 3.5–5.3)
Sodium: 136 mEq/L (ref 135–145)
TOTAL PROTEIN: 6.8 g/dL (ref 6.0–8.3)

## 2013-04-18 NOTE — Assessment & Plan Note (Signed)
Recheck renal function. ?

## 2013-04-18 NOTE — Assessment & Plan Note (Signed)
She has chronic back pain and has been on high-dose narcotics for some time now. She was taken off of her narcotics when she went to the nursing home and she was last given Suboxone. Suboxone may not be the best thing for her. She's not displaying any withdrawal symptoms at this time. She has an appointment with pain management in [place] on April 22. Will give her 12 tablets of Percocet one twice a day at the nursing home can dispense over the weekend as needed for pain.

## 2013-04-18 NOTE — Assessment & Plan Note (Signed)
I reviewed her medications. I'm not sure why she is on 4 times a day Klonopin as well as Xanax. I will discontinue the Xanax and keep her Klonopin which she has been on by her psychiatrist. She will continue her other medications and her appointment will be rescheduled.

## 2013-04-18 NOTE — Progress Notes (Signed)
Patient ID: Tonya Sullivan, female   DOB: 10-Dec-1955, 58 y.o.   MRN: [id number]   Subjective:    Patient ID: Tonya Sullivan, female    DOB: March 12, 1955, 58 y.o.   MRN: [id number]  Patient presents for hopital F/U  patient here followup recent admission.She was admitted secondary to altered mental status. This is thought to be secondary to mix of her mental illness as well as possible medication overuse with benzodiazepines and her pain medications. She was actually started on Suboxone the week before her admission states patient. She states a Suboxone made her feel funny. However when she was admitted she was taken off of most of her medications and they have slowly restarted with the exception of narcotics at discharge. She was also given Xanax as well as her regular Klonopin and they've been giving her both at the nursing home. She's not want to be on the Xanax and has been trying to refuse this medication. She did have a followup with psychiatry this morning however she missed the appointment. She has an appointment scheduled with her pain Dr. on the 22nd.  Is having difficulty with her children. Her son appears to be the power of attorney she's currently in a nursing home but states that she will be discharged on Monday because he states that she does not qualify to be in a skilled nursing facility. She still has her own apartment and her nurse aide. He does not require any physical therapy at this time.  No meds with her today  Review Of Systems:  GEN- denies fatigue, fever, weight loss,weakness, recent illness HEENT- denies eye drainage, change in vision, nasal discharge, CVS- denies chest pain, palpitations RESP- denies SOB, cough, wheeze ABD- denies N/V, change in stools, abd pain GU- denies dysuria, hematuria, dribbling, incontinence MSK- + joint pain, muscle aches, injury Neuro- denies headache, dizziness, syncope, seizure activity       Objective:    BP 138/74  Pulse 84   Temp(Src) 98 F (36.7 C) (Oral)  Resp 16  Ht 5\' 5"  (1.651 m)  Wt 185 lb (83.915 kg)  BMI 30.79 kg/m2 GEN- NAD, alert and oriented x3 HEENT- PERRL, EOMI, non injected sclera, pink conjunctiva, MMM, oropharynx clear, cushingoid face, Ears- canals impacted bilat Neck- Supple, no LAD CVS- RRR, no murmur RESP-CTAB ABD-NABS,soft,NT,ND PSych- Depressed affect, on phone with her son she was very upset and anxious, no SI, no hallucinations, speech at baseline, thought process normal EXT- No edema Pulses- Radial 2+        Assessment & Plan:      Problem List Items Addressed This Visit   Chronic back pain     She has chronic back pain and has been on high-dose narcotics for some time now. She was taken off of her narcotics when she went to the nursing home and she was last given Suboxone. Suboxone may not be the best thing for her. She's not displaying any withdrawal symptoms at this time. She has an appointment with pain management in [place] on April 22. Will give her 12 tablets of Percocet one twice a day at the nursing home can dispense over the weekend as needed for pain.    Relevant Medications      OXYCODONE-acetaminophen  5-325 mg po tabs   Cerumen impaction     Irritated at the bedside left he was still impacted therefore Debrox drops were given    Bipolar disorder     I reviewed her medications. I'm  not sure why she is on 4 times a day Klonopin as well as Xanax. I will discontinue the Xanax and keep her Klonopin which she has been on by her psychiatrist. She will continue her other medications and her appointment will be rescheduled.    Altered mental status     I think that this was multifactorial between her medications and her mental illness. She missed her appointment with her psychiatrist this is going to be rescheduled by them. She also has an appointment with the pain clinic. Today she is at her baseline.    AKI (acute kidney injury)     Recheck renal function     Adrenal cortical insufficiency - Primary   Relevant Orders      Comprehensive metabolic panel (Completed)      CBC with Differential (Completed)      Note: This dictation was prepared with Dragon dictation along with smaller phrase technology. Any transcriptional errors that result from this process are unintentional.

## 2013-04-18 NOTE — Assessment & Plan Note (Signed)
Irritated at the bedside left he was still impacted therefore Debrox drops were given

## 2013-04-18 NOTE — Assessment & Plan Note (Addendum)
I think that this was multifactorial between her medications and her mental illness. She missed her appointment with her psychiatrist this is going to be rescheduled by them. She also has an appointment with the pain clinic. Today she is at her baseline.  I think she would benefit from staying in the nursing home this is the second time she has been one and has been sent home. We will try to contact the nursing home see she does actually qualify to stay

## 2013-04-20 ENCOUNTER — Telehealth (HOSPITAL_COMMUNITY): Payer: Self-pay | Admitting: *Deleted

## 2013-04-20 ENCOUNTER — Ambulatory Visit (HOSPITAL_COMMUNITY): Payer: Self-pay | Admitting: Psychiatry

## 2013-04-21 ENCOUNTER — Telehealth (HOSPITAL_COMMUNITY): Payer: Self-pay | Admitting: *Deleted

## 2013-04-22 ENCOUNTER — Emergency Department (HOSPITAL_COMMUNITY): Payer: Medicare Other

## 2013-04-22 ENCOUNTER — Telehealth: Payer: Self-pay | Admitting: *Deleted

## 2013-04-22 ENCOUNTER — Inpatient Hospital Stay (HOSPITAL_COMMUNITY)
Admission: EM | Admit: 2013-04-22 | Discharge: 2013-04-24 | DRG: 683 | Disposition: A | Discharge status: Home Health | Payer: Medicare Other | Attending: Internal Medicine | Admitting: Internal Medicine

## 2013-04-22 ENCOUNTER — Encounter: Payer: Self-pay | Admitting: *Deleted

## 2013-04-22 ENCOUNTER — Encounter (HOSPITAL_COMMUNITY): Payer: Self-pay | Admitting: Emergency Medicine

## 2013-04-22 ENCOUNTER — Telehealth (HOSPITAL_COMMUNITY): Payer: Self-pay | Admitting: *Deleted

## 2013-04-22 DIAGNOSIS — E860 Dehydration: Secondary | ICD-10-CM | POA: Diagnosis present

## 2013-04-22 DIAGNOSIS — Z79899 Other long term (current) drug therapy: Secondary | ICD-10-CM

## 2013-04-22 DIAGNOSIS — M5137 Other intervertebral disc degeneration, lumbosacral region: Secondary | ICD-10-CM | POA: Diagnosis present

## 2013-04-22 DIAGNOSIS — Z720 Tobacco use: Secondary | ICD-10-CM | POA: Diagnosis present

## 2013-04-22 DIAGNOSIS — N179 Acute kidney failure, unspecified: Principal | ICD-10-CM | POA: Diagnosis present

## 2013-04-22 DIAGNOSIS — G8929 Other chronic pain: Secondary | ICD-10-CM | POA: Diagnosis present

## 2013-04-22 DIAGNOSIS — E2740 Unspecified adrenocortical insufficiency: Secondary | ICD-10-CM | POA: Diagnosis present

## 2013-04-22 DIAGNOSIS — Z818 Family history of other mental and behavioral disorders: Secondary | ICD-10-CM

## 2013-04-22 DIAGNOSIS — F319 Bipolar disorder, unspecified: Secondary | ICD-10-CM | POA: Diagnosis present

## 2013-04-22 DIAGNOSIS — E785 Hyperlipidemia, unspecified: Secondary | ICD-10-CM | POA: Diagnosis present

## 2013-04-22 DIAGNOSIS — N390 Urinary tract infection, site not specified: Secondary | ICD-10-CM | POA: Diagnosis present

## 2013-04-22 DIAGNOSIS — Z8249 Family history of ischemic heart disease and other diseases of the circulatory system: Secondary | ICD-10-CM

## 2013-04-22 DIAGNOSIS — Z8673 Personal history of transient ischemic attack (TIA), and cerebral infarction without residual deficits: Secondary | ICD-10-CM

## 2013-04-22 DIAGNOSIS — E876 Hypokalemia: Secondary | ICD-10-CM | POA: Diagnosis present

## 2013-04-22 DIAGNOSIS — W19XXXA Unspecified fall, initial encounter: Secondary | ICD-10-CM | POA: Diagnosis present

## 2013-04-22 DIAGNOSIS — E2749 Other adrenocortical insufficiency: Secondary | ICD-10-CM | POA: Diagnosis present

## 2013-04-22 DIAGNOSIS — I959 Hypotension, unspecified: Secondary | ICD-10-CM

## 2013-04-22 DIAGNOSIS — J4489 Other specified chronic obstructive pulmonary disease: Secondary | ICD-10-CM | POA: Diagnosis present

## 2013-04-22 DIAGNOSIS — Z87891 Personal history of nicotine dependence: Secondary | ICD-10-CM

## 2013-04-22 DIAGNOSIS — I951 Orthostatic hypotension: Secondary | ICD-10-CM | POA: Diagnosis present

## 2013-04-22 DIAGNOSIS — J449 Chronic obstructive pulmonary disease, unspecified: Secondary | ICD-10-CM | POA: Diagnosis present

## 2013-04-22 DIAGNOSIS — I10 Essential (primary) hypertension: Secondary | ICD-10-CM | POA: Diagnosis present

## 2013-04-22 DIAGNOSIS — Z801 Family history of malignant neoplasm of trachea, bronchus and lung: Secondary | ICD-10-CM

## 2013-04-22 DIAGNOSIS — F411 Generalized anxiety disorder: Secondary | ICD-10-CM | POA: Diagnosis present

## 2013-04-22 DIAGNOSIS — F329 Major depressive disorder, single episode, unspecified: Secondary | ICD-10-CM | POA: Diagnosis present

## 2013-04-22 DIAGNOSIS — Z85820 Personal history of malignant melanoma of skin: Secondary | ICD-10-CM

## 2013-04-22 DIAGNOSIS — K219 Gastro-esophageal reflux disease without esophagitis: Secondary | ICD-10-CM | POA: Diagnosis present

## 2013-04-22 DIAGNOSIS — IMO0002 Reserved for concepts with insufficient information to code with codable children: Secondary | ICD-10-CM

## 2013-04-22 DIAGNOSIS — F32A Depression, unspecified: Secondary | ICD-10-CM | POA: Diagnosis present

## 2013-04-22 DIAGNOSIS — Z9181 History of falling: Secondary | ICD-10-CM

## 2013-04-22 DIAGNOSIS — M51379 Other intervertebral disc degeneration, lumbosacral region without mention of lumbar back pain or lower extremity pain: Secondary | ICD-10-CM | POA: Diagnosis present

## 2013-04-22 LAB — URINALYSIS, ROUTINE W REFLEX MICROSCOPIC
BILIRUBIN URINE: NEGATIVE
Glucose, UA: NEGATIVE mg/dL
KETONES UR: NEGATIVE mg/dL
NITRITE: NEGATIVE
PROTEIN: 30 mg/dL — AB
SPECIFIC GRAVITY, URINE: 1.01 (ref 1.005–1.030)
UROBILINOGEN UA: 0.2 mg/dL (ref 0.0–1.0)
pH: 5.5 (ref 5.0–8.0)

## 2013-04-22 LAB — COMPREHENSIVE METABOLIC PANEL
ALBUMIN: 3.4 g/dL — AB (ref 3.5–5.2)
ALT: 30 U/L (ref 0–35)
AST: 51 U/L — ABNORMAL HIGH (ref 0–37)
Alkaline Phosphatase: 74 U/L (ref 39–117)
BUN: 17 mg/dL (ref 6–23)
CALCIUM: 9.5 mg/dL (ref 8.4–10.5)
CO2: 32 mEq/L (ref 19–32)
CREATININE: 1.77 mg/dL — AB (ref 0.50–1.10)
Chloride: 93 mEq/L — ABNORMAL LOW (ref 96–112)
GFR calc Af Amer: 36 mL/min — ABNORMAL LOW (ref >90)
GFR calc non Af Amer: 31 mL/min — ABNORMAL LOW (ref >90)
GLUCOSE: 111 mg/dL — AB (ref 70–99)
POTASSIUM: 3.6 meq/L — AB (ref 3.7–5.3)
Sodium: 138 mEq/L (ref 137–147)
TOTAL PROTEIN: 7.3 g/dL (ref 6.0–8.3)
Total Bilirubin: 0.4 mg/dL (ref 0.3–1.2)

## 2013-04-22 LAB — CBC WITH DIFFERENTIAL/PLATELET
BASOS ABS: 0 10*3/uL (ref 0.0–0.1)
BASOS PCT: 0 % (ref 0–1)
EOS ABS: 0 10*3/uL (ref 0.0–0.7)
Eosinophils Relative: 0 % (ref 0–5)
HEMATOCRIT: 43.9 % (ref 36.0–46.0)
Hemoglobin: 14.6 g/dL (ref 12.0–15.0)
Lymphocytes Relative: 16 % (ref 12–46)
Lymphs Abs: 1.4 10*3/uL (ref 0.7–4.0)
MCH: 31.5 pg (ref 26.0–34.0)
MCHC: 33.3 g/dL (ref 30.0–36.0)
MCV: 94.6 fL (ref 78.0–100.0)
MONO ABS: 0.8 10*3/uL (ref 0.1–1.0)
Monocytes Relative: 9 % (ref 3–12)
NEUTROS ABS: 6.7 10*3/uL (ref 1.7–7.7)
Neutrophils Relative %: 75 % (ref 43–77)
Platelets: 402 10*3/uL — ABNORMAL HIGH (ref 150–400)
RBC: 4.64 MIL/uL (ref 3.87–5.11)
RDW: 13.9 % (ref 11.5–15.5)
WBC: 9 10*3/uL (ref 4.0–10.5)

## 2013-04-22 LAB — URINE MICROSCOPIC-ADD ON

## 2013-04-22 LAB — LACTIC ACID, PLASMA: LACTIC ACID, VENOUS: 1.2 mmol/L (ref 0.5–2.2)

## 2013-04-22 MED ORDER — SODIUM CHLORIDE 0.9 % IV BOLUS (SEPSIS)
500.0000 mL | Freq: Once | INTRAVENOUS | Status: AC
  Administered 2013-04-22: 500 mL via INTRAVENOUS

## 2013-04-22 MED ORDER — LIDOCAINE 5 % EX PTCH
MEDICATED_PATCH | CUTANEOUS | Status: AC
  Filled 2013-04-22: qty 1

## 2013-04-22 MED ORDER — LIDOCAINE VISCOUS 2 % MT SOLN
20.0000 mL | OROMUCOSAL | Status: DC | PRN
  Administered 2013-04-23: 20 mL via OROMUCOSAL
  Filled 2013-04-22: qty 20
  Filled 2013-04-22: qty 30

## 2013-04-22 MED ORDER — DEXTROSE 5 % IV SOLN
1.0000 g | INTRAVENOUS | Status: DC
  Administered 2013-04-22 – 2013-04-23 (×2): 1 g via INTRAVENOUS
  Filled 2013-04-22 (×5): qty 10

## 2013-04-22 MED ORDER — LIDOCAINE 5 % EX PTCH
1.0000 | MEDICATED_PATCH | Freq: Two times a day (BID) | CUTANEOUS | Status: DC
  Administered 2013-04-22 – 2013-04-24 (×4): 1 via TRANSDERMAL
  Filled 2013-04-22 (×11): qty 1

## 2013-04-22 MED ORDER — HYDROCORTISONE NA SUCCINATE PF 100 MG IJ SOLR
100.0000 mg | Freq: Once | INTRAMUSCULAR | Status: AC
  Administered 2013-04-22: 100 mg via INTRAVENOUS
  Filled 2013-04-22: qty 2

## 2013-04-22 MED ORDER — ALBUTEROL SULFATE HFA 108 (90 BASE) MCG/ACT IN AERS: 2.0000 | INHALATION_SPRAY | Freq: Three times a day (TID) | RESPIRATORY_TRACT | Status: DC | PRN

## 2013-04-22 MED ORDER — TIOTROPIUM BROMIDE MONOHYDRATE 18 MCG IN CAPS
18.0000 ug | ORAL_CAPSULE | Freq: Every day | RESPIRATORY_TRACT | Status: DC
  Administered 2013-04-23: 18 ug via RESPIRATORY_TRACT
  Filled 2013-04-22: qty 5

## 2013-04-22 MED ORDER — CLONAZEPAM 0.5 MG PO TABS
1.0000 mg | ORAL_TABLET | Freq: Three times a day (TID) | ORAL | Status: DC | PRN
  Administered 2013-04-22 – 2013-04-24 (×2): 1 mg via ORAL
  Filled 2013-04-22 (×2): qty 2

## 2013-04-22 MED ORDER — BACLOFEN 10 MG PO TABS
ORAL_TABLET | ORAL | Status: AC
  Filled 2013-04-22: qty 1

## 2013-04-22 MED ORDER — DULOXETINE HCL 60 MG PO CPEP
60.0000 mg | ORAL_CAPSULE | Freq: Every morning | ORAL | Status: DC
  Administered 2013-04-23 – 2013-04-24 (×2): 60 mg via ORAL
  Filled 2013-04-22 (×2): qty 1

## 2013-04-22 MED ORDER — SODIUM CHLORIDE 0.9 % IV SOLN
INTRAVENOUS | Status: DC
Start: 2013-04-22 — End: 2013-04-22

## 2013-04-22 MED ORDER — HYDROCORTISONE NA SUCCINATE PF 100 MG IJ SOLR
100.0000 mg | Freq: Three times a day (TID) | INTRAMUSCULAR | Status: DC
  Administered 2013-04-23: 100 mg via INTRAVENOUS
  Filled 2013-04-22: qty 2

## 2013-04-22 MED ORDER — SODIUM CHLORIDE 0.9 % IJ SOLN
3.0000 mL | Freq: Two times a day (BID) | INTRAMUSCULAR | Status: DC
  Administered 2013-04-23 – 2013-04-24 (×3): 3 mL via INTRAVENOUS

## 2013-04-22 MED ORDER — ONDANSETRON HCL 4 MG/2ML IJ SOLN: 4.0000 mg | Freq: Four times a day (QID) | INTRAMUSCULAR | Status: DC | PRN

## 2013-04-22 MED ORDER — PIPERACILLIN-TAZOBACTAM 3.375 G IVPB 30 MIN
3.3750 g | Freq: Once | INTRAVENOUS | Status: AC
  Administered 2013-04-22: 3.375 g via INTRAVENOUS
  Filled 2013-04-22 (×2): qty 50

## 2013-04-22 MED ORDER — ALBUTEROL SULFATE (2.5 MG/3ML) 0.083% IN NEBU: 2.5000 mg | INHALATION_SOLUTION | RESPIRATORY_TRACT | Status: DC | PRN

## 2013-04-22 MED ORDER — POTASSIUM CHLORIDE IN NACL 40-0.9 MEQ/L-% IV SOLN
INTRAVENOUS | Status: DC
  Administered 2013-04-22 – 2013-04-24 (×3): via INTRAVENOUS

## 2013-04-22 MED ORDER — HEPARIN SODIUM (PORCINE) 5000 UNIT/ML IJ SOLN
5000.0000 [IU] | Freq: Three times a day (TID) | INTRAMUSCULAR | Status: DC
  Administered 2013-04-22 – 2013-04-24 (×6): 5000 [IU] via SUBCUTANEOUS
  Filled 2013-04-22 (×8): qty 1

## 2013-04-22 MED ORDER — ONDANSETRON HCL 4 MG PO TABS: 4.0000 mg | ORAL_TABLET | Freq: Four times a day (QID) | ORAL | Status: DC | PRN

## 2013-04-22 MED ORDER — PANTOPRAZOLE SODIUM 40 MG PO TBEC
40.0000 mg | DELAYED_RELEASE_TABLET | Freq: Every day | ORAL | Status: DC
Start: 2013-04-23 — End: 2013-04-24
  Administered 2013-04-23 – 2013-04-24 (×2): 40 mg via ORAL
  Filled 2013-04-22 (×2): qty 1

## 2013-04-22 MED ORDER — AMITRIPTYLINE HCL 25 MG PO TABS
100.0000 mg | ORAL_TABLET | Freq: Every day | ORAL | Status: DC
  Administered 2013-04-22 – 2013-04-23 (×2): 100 mg via ORAL
  Filled 2013-04-22 (×2): qty 4

## 2013-04-22 MED ORDER — BACLOFEN 10 MG PO TABS
10.0000 mg | ORAL_TABLET | Freq: Two times a day (BID) | ORAL | Status: DC
  Administered 2013-04-22 – 2013-04-24 (×4): 10 mg via ORAL
  Filled 2013-04-22 (×6): qty 1

## 2013-04-22 MED ORDER — SIMVASTATIN 20 MG PO TABS
20.0000 mg | ORAL_TABLET | Freq: Every day | ORAL | Status: DC
Start: 2013-04-23 — End: 2013-04-24
  Administered 2013-04-23: 20 mg via ORAL
  Filled 2013-04-22: qty 1

## 2013-04-22 MED ORDER — OXYBUTYNIN CHLORIDE ER 5 MG PO TB24
10.0000 mg | ORAL_TABLET | Freq: Every day | ORAL | Status: DC
  Administered 2013-04-23 – 2013-04-24 (×2): 10 mg via ORAL
  Filled 2013-04-22 (×2): qty 2

## 2013-04-22 MED ORDER — DEXTROSE 5 % IV SOLN
INTRAVENOUS | Status: AC
  Filled 2013-04-22: qty 10

## 2013-04-22 MED ORDER — ESCITALOPRAM OXALATE 10 MG PO TABS
20.0000 mg | ORAL_TABLET | ORAL | Status: DC
  Administered 2013-04-23 – 2013-04-24 (×2): 20 mg via ORAL
  Filled 2013-04-22 (×2): qty 2

## 2013-04-22 NOTE — ED Notes (Signed)
Patient up to bedside commode.  IV antibiotic infusing with a NS 56ml bolus without difficulty.  Patient alert, oriented to person and place, but not time.  Admitting physician at bedside to assess for admission.

## 2013-04-22 NOTE — Telephone Encounter (Signed)
noted 

## 2013-04-22 NOTE — Telephone Encounter (Signed)
Returned call to [place].   States that pt BP noted decreased 90/60 sitting, and 50 systolic while standing.   Reports that she spoke with Bradd Canary, NP and agreed that patient should be seen in ER.   EMS contacted and patient is being transferred to [facility].

## 2013-04-22 NOTE — H&P (Addendum)
[facility] History and Physical  Iliyana Everlean Alstrom TDD:[id number] DOB: 1955/11/20 DOA: 04/22/2013  Referring physician: Dr Alvino Chapel PCP: Vic Blackbird, MD   Chief Complaint:  Fall at pain clinic  HPI:  58 year old female with history of adrenal insufficiency on chronic low-dose prednisone, bipolar disorder, chronic back pain, hypertension, GERD, COPD who was recently hospitalized for altered mental status unclear etiology when she was also evaluated by psychiatry and medications adjusted and was sent to skilled nursing facility . She was discharged from there few days back and went to the pain clinic today and reportedly had a fall there. Patient denies loss of consciousness or hitting her head and reports that she slumped on the ground and was aware about her surroundings. She does reports feeling foggy. Her blood pressure was found to be 90/60 on sitting and 50 systolic upon standing. Patient's PCP was called by her home health and was instructed to call EMS and bring her to ED. in the ED the patient's blood pressure was 127/91 mmHg on lying down but dropped to 77/41 mmHg on sitting up. Her pulse was 108, and she was afebrile.. Blood work done showed a normal CBC, mild hypokalemia with potassium of 2.6, chloride of 93 and elevated creatinine of 1.77. Blood glucose was 111. Chest x-ray was unremarkable. UA was suggestive of UTI. Patient given 1 L IV normal saline bolus and one dose of IV Zosyn and [facility] called for admission to telemetry. Patient denies any recent illness or missing out on her medications. Patient  is not a good historian. Patient denies headache,  fever, chills, nausea , vomiting, chest pain, palpitations, SOB, abdominal pain, bowel or urinary symptoms. Denies change in weight or appetite.    Review of Systems:  Constitutional: Denies fever, chills, diaphoresis, appetite change and fatigue.  HEENT: Denies photophobia, eye pain, hearing loss, ear pain,  congestion, sore throat, rhinorrhea, sneezing, mouth sores, trouble swallowing, neck pain. Respiratory: Denies SOB, DOE, cough, chest tightness,  and wheezing.   Cardiovascular: Denies chest pain, palpitations and leg swelling.  Gastrointestinal: Denies nausea, vomiting, abdominal pain, diarrhea, constipation, blood in stool and abdominal distention.  Genitourinary: Denies dysuria, urgency, frequency, hematuria, flank pain and difficulty urinating.  Endocrine: Denies: hot or cold intolerance, sweats,  polyuria, polydipsia. Musculoskeletal: chronic back  pain, Denies myalgias,  joint swelling, arthralgias and gait problem.  Skin: Denies pallor, rash and wound.  Neurological: Lightheadedness, fall , Denies dizziness, seizures, syncope, weakness,  numbness and headaches.  Psychiatric/Behavioral: Denies  mood changes, confusion, nervousness,  Past Medical History  Diagnosis Date  . COPD (chronic obstructive pulmonary disease)   . Depression   . Chronic back pain     DDD, disc bulge, radiculopathy, spinal stenosis  . Hyperlipidemia   . Anxiety   . Bipolar disorder, unspecified   . Carotid artery calcification   . DDD (degenerative disc disease), lumbosacral   . Spinal stenosis, lumbar   . Thyroid disease   . Autoimmune thyroiditis   . Hypertension   . TIA (transient ischemic attack)     august 2014  . GERD (gastroesophageal reflux disease)   . H/O dizziness   . Neuropathic pain   . Adrenal gland dysfunction     Addison's disease ON DAILY PREDNISONE  . Addison's disease   . Stroke     HX OF MINI STROKE AUG 2014- DROPPIN EVERYTHING, SPEECH SLURRED, MEMORY AFFECTED --ALL RESOLVED - NO DEFICITS NOW  . Pneumonia     LAST PNEUMONIA WAS AUG  2014  . Achalasia   . Mouth ulcers   . Dysrhythmia     palpitations  . Melanoma     removed 1 week ago at Dr Teryl Lucy office   Past Surgical History  Procedure Laterality Date  . Appendectomy    . Cholecystectomy    . Lump left breast       removed- benign  . Abdominal hysterectomy      tubal pregnancy  . Inguinal hernia repair    . Stomach surgery      ?holes in esophgous  . Ectopic pregnancy surgery    . Back surgery      x 5;1984;1989;;1999;2000;2010  . Colonoscopy  01/25/2012    Procedure: COLONOSCOPY;  Surgeon: Rogene Houston, MD;  Location: [facility];  Service: Endoscopy;  Laterality: N/A;  130  . Esophagogastroduodenoscopy (egd) with esophageal dilation N/A 08/13/2012    Procedure: ESOPHAGOGASTRODUODENOSCOPY (EGD) WITH ESOPHAGEAL DILATION;  Surgeon: Rogene Houston, MD;  Location: [facility];  Service: Endoscopy;  Laterality: N/A;  315  . Breast surgery      left breast  . Anterior cervical decomp/discectomy fusion N/A 10/21/2012    Procedure: Cervical Six-Seven Anterior cervical decompression/diskectomy/fusion;  Surgeon: Kristeen Miss, MD;  Location: [place] [facility];  Service: Neurosurgery;  Laterality: N/A;  Cervical Six-Seven Anterior cervical decompression/diskectomy/fusion  . Cataract extraction w/phaco Right 11/25/2012    Procedure: CATARACT EXTRACTION PHACO AND INTRAOCULAR LENS PLACEMENT (IOC);  Surgeon: Elta Guadeloupe T. Gershon Crane, MD;  Location: [facility];  Service: Ophthalmology;  Laterality: Right;  CDE:10.06  . Cataract extraction w/phaco Left 12/09/2012    Procedure: CATARACT EXTRACTION PHACO AND INTRAOCULAR LENS PLACEMENT (IOC);  Surgeon: Elta Guadeloupe T. Gershon Crane, MD;  Location: [facility];  Service: Ophthalmology;  Laterality: Left;  CDE:5.06  . Tonsillectomy    . Hiatal hernia repair    . Heller myotomy N/A 03/17/2013    Procedure: DIAGNOSTIC LAPAROSCOPY, LAPAROSCOPIC HELLER MYOTOMY, ENDOSCOPY, DOR FUNDOPLICATION;  Surgeon: Ralene Ok, MD;  Location: [facility];  Service: General;  Laterality: N/A;   Social History:  reports that she quit smoking about 6 weeks ago. Her smoking use included Cigarettes. She has a 10 pack-year smoking history. She has never used smokeless tobacco. She reports that she does not drink alcohol or use  illicit drugs.  Allergies  Allergen Reactions  . Codeine Itching  . Cyclobenzaprine Hives  . Darvocet [Propoxyphene N-Acetaminophen] Rash  . Abilify [Aripiprazole] Other (See Comments)    tremors  . Ace Inhibitors   . Adhesive [Tape] Hives  . Gabapentin Swelling  . Iodine Hives  . Levofloxacin Hives and Hypertension  . Prednisone Hives, Itching and Hypertension  . Sulfa Antibiotics Itching  . Robaxin [Methocarbamol] Rash    Family History  Problem Relation Age of Onset  . Hypertension Mother   . Heart disease Mother   . Heart attack Mother   . Anxiety disorder Mother   . Cancer Father     lung cancer  . Heart attack Father   . Alcohol abuse Father   . Hypertension Brother   . Alcohol abuse Brother   . Bipolar disorder Brother   . ADD / ADHD Brother   . Drug abuse Brother   . OCD Brother   . Alcohol abuse Sister   . Bipolar disorder Sister   . Anxiety disorder Sister   . ADD / ADHD Sister   . Drug abuse Sister   . Physical abuse Sister   . Sexual abuse Sister   .  Alcohol abuse Brother   . Bipolar disorder Brother   . ADD / ADHD Brother   . Drug abuse Brother   . Alcohol abuse Sister   . Bipolar disorder Sister   . Anxiety disorder Sister   . ADD / ADHD Sister   . Physical abuse Sister   . Sexual abuse Sister   . Alcohol abuse Brother   . Bipolar disorder Brother   . ADD / ADHD Brother   . Bipolar disorder Brother   . ADD / ADHD Brother   . Alcohol abuse Brother   . Paranoid behavior Brother   . Physical abuse Brother   . Sexual abuse Brother   . Bipolar disorder Maternal Aunt   . Bipolar disorder Paternal Aunt   . Bipolar disorder Maternal Uncle   . Bipolar disorder Paternal Uncle   . Bipolar disorder Maternal Grandfather   . Alcohol abuse Maternal Grandfather   . Bipolar disorder Maternal Grandmother   . Alcohol abuse Maternal Grandmother   . Anxiety disorder Maternal Grandmother   . Dementia Maternal Grandmother   . Bipolar disorder Paternal  Grandfather   . Alcohol abuse Paternal Grandfather   . Bipolar disorder Paternal Grandmother   . Alcohol abuse Paternal Grandmother   . Drug abuse Paternal Grandmother   . Bipolar disorder Maternal Uncle   . Schizophrenia Neg Hx   . Seizures Neg Hx     Prior to Admission medications   Medication Sig Start Date End Date Taking? Authorizing Provider  albuterol (PROVENTIL HFA;VENTOLIN HFA) 108 (90 BASE) MCG/ACT inhaler Inhale 2 puffs into the lungs 3 (three) times daily as needed for wheezing or shortness of breath.   Yes Historical Provider, MD  albuterol (PROVENTIL) (2.5 MG/3ML) 0.083% nebulizer solution Take 2.5 mg by nebulization every 4 (four) hours as needed for wheezing. 03/16/13  Yes Alycia Rossetti, MD  amitriptyline (ELAVIL) 100 MG tablet Take 1 tablet (100 mg total) by mouth at bedtime. 02/12/13  Yes Levonne Spiller, MD  ampicillin (PRINCIPEN) 500 MG capsule Take 1 capsule by mouth 3 (three) times daily. For 4 days 04/20/13  Yes Historical Provider, MD  baclofen (LIORESAL) 10 MG tablet Take 10 mg by mouth 2 (two) times daily.   Yes Historical Provider, MD  clonazePAM (KLONOPIN) 1 MG tablet Take 1 tablet (1 mg total) by mouth 4 (four) times daily as needed for anxiety. 04/13/13  Yes Annita Brod, MD  DULoxetine (CYMBALTA) 60 MG capsule Take 1 capsule (60 mg total) by mouth every morning. 02/12/13  Yes Levonne Spiller, MD  escitalopram (LEXAPRO) 20 MG tablet Take 1 tablet (20 mg total) by mouth every morning. 02/12/13 02/12/14 Yes Levonne Spiller, MD  hydrochlorothiazide (HYDRODIURIL) 25 MG tablet Take 25 mg by mouth every morning. 03/02/13  Yes Historical Provider, MD  lidocaine (LIDODERM) 5 % Place 1 patch onto the skin every 12 (twelve) hours. 04/22/13  Yes Historical Provider, MD  lidocaine (XYLOCAINE) 2 % solution Use as directed 20 mLs in the mouth or throat every 4 (four) hours as needed for mouth pain.  04/18/13  Yes Historical Provider, MD  losartan (COZAAR) 50 MG tablet Take 1 tablet by  mouth daily. 04/03/13  Yes Historical Provider, MD  metoprolol tartrate (LOPRESSOR) 25 MG tablet Take 1 tablet (25 mg total) by mouth 2 (two) times daily with a meal. 04/13/13  Yes Annita Brod, MD  oxybutynin (DITROPAN XL) 10 MG 24 hr tablet Take 1 tablet (10 mg total) by mouth daily. 02/27/13  Yes Alycia Rossetti, MD  pravastatin (PRAVACHOL) 40 MG tablet Take 1 tablet (40 mg total) by mouth every evening. 02/27/13  Yes Alycia Rossetti, MD  propranolol (INDERAL) 10 MG tablet Take 1 tablet by mouth 3 (three) times daily. 04/18/13  Yes Historical Provider, MD  tiotropium (SPIRIVA) 18 MCG inhalation capsule Place 1 capsule (18 mcg total) into inhaler and inhale daily. USES IN THE EVENING 02/27/13  Yes Alycia Rossetti, MD  fluocinonide gel (LIDEX) 3.38 % Apply 1 application topically 2 (two) times daily. 04/09/13   Historical Provider, MD     Physical Exam:  Filed Vitals:   04/22/13 1709 04/22/13 1933 04/22/13 1934 04/22/13 1935  BP: 127/91 114/49 97/64 77/41   Pulse: 92 88 56 108  Temp:      TempSrc:      Resp:      Height:      Weight:      SpO2:        Constitutional: Vital signs reviewed.  Patient is a well-developed and well-nourished in no acute distress. HEENT: no pallor, no icterus, moist oral mucosa, no cervical lymphadenopathy Cardiovascular: RRR, S1 normal, S2 normal, no MRG Chest: CTAB, no wheezes, rales, or rhonchi Abdominal: Soft. Non-tender, non-distended, bowel sounds are normal, no masses, organomegaly, or guarding present.  GU: no CVA tenderness Ext: warm, no edema Neurological: A&O x3, confused with year,  non focal  Labs on Admission:  Basic Metabolic Panel:  Recent Labs Lab 04/17/13 1525 04/22/13 1511  NA 136 138  K 4.3 3.6*  CL 93* 93*  CO2 32 32  GLUCOSE 94 111*  BUN 11 17  CREATININE 0.92 1.77*  CALCIUM 9.4 9.5   Liver Function Tests:  Recent Labs Lab 04/17/13 1525 04/22/13 1511  AST 21 51*  ALT 24 30  ALKPHOS 60 74  BILITOT 0.3 0.4  PROT  6.8 7.3  ALBUMIN 4.0 3.4*   No results found for this basename: LIPASE, AMYLASE,  in the last 168 hours No results found for this basename: AMMONIA,  in the last 168 hours CBC:  Recent Labs Lab 04/17/13 1525 04/22/13 1511  WBC 7.1 9.0  NEUTROABS 5.1 6.7  HGB 13.2 14.6  HCT 39.2 43.9  MCV 92.2 94.6  PLT 402* 402*   Cardiac Enzymes: No results found for this basename: CKTOTAL, CKMB, CKMBINDEX, TROPONINI,  in the last 168 hours BNP: No components found with this basename: POCBNP,  CBG: No results found for this basename: GLUCAP,  in the last 168 hours  Radiological Exams on Admission: Dg Chest 2 View  04/22/2013   CLINICAL DATA:  Status post fall.  Hypotension.  EXAM: CHEST  2 VIEW  COMPARISON:  February 09, 2013  FINDINGS: The heart size and mediastinal contours are within normal limits. There is no focal infiltrate, pulmonary edema, or pleural effusion. The visualized skeletal structures are stable.  IMPRESSION: No active cardiopulmonary disease.   Electronically Signed   By: Abelardo Diesel M.D.   On: 04/22/2013 16:08    EKG: NSR at 83, no ST-T changes  Assessment/Plan Principal Problem:   Orthostatic hypotension Admit to telemetry. We'll hold home blood pressure medications. Continue IV normal saline at 125 cc per hour. Check repeat orthostasis in the morning. Given history of adrenal  insufficiency I will place her on stress dose steroid with IV hydrocortisone 100 mg every 8 hours. PT eval. neurocheck q4 hr. Will hold head imaging at this time.   Active Problems:    AKI (acute  kidney injury) Possibly prerenal with ? Dehydration and UTI. Will monitor with IV hydration. Continue HCTZ. ARB  was discontinued during the last hospitalization.    Adrenal cortical insufficiency On chronic low-dose prednisone. Placed on stress dose IV hydrocortisone. Follow random cortisol sent from ED.    Depression Continue Cymbalta and Lexapro  Bipolar disorder Continue when necessary  klonipin tid    Chronic pain Continue twice a day baclofen. Patient was seen in the pain clinic today but was not prescribed any new medication.     COPD (chronic obstructive pulmonary disease) Continue albuterol inhaler and nebs      Hx of TIA (transient ischemic attack) Continue statin      UTI (urinary tract infection) Patient received IV Zosyn in the ED . Will simplify to IV Rocephin. Follow urine culture      Diet:cardiac  DVT prophylaxis: sq heparin   Code Status: full code Family Communication: None at bedside Disposition Plan: Pending PT eval  Cardell Rachel [facility] Pager [phone]  Total time spent on admission :70 minutes  If 7PM-7AM, please contact night-coverage [email or web address] Password [facility] 04/22/2013, 9:44 PM

## 2013-04-22 NOTE — Telephone Encounter (Signed)
Noted , agree with EDP

## 2013-04-22 NOTE — ED Notes (Signed)
Pt reports three falls at pain MD's office today. Pt's health aid drove pt home and reported that patient was "out of it" at home and called EMS. Pt recently discharged from rehabilitation center on 04/20/13. Pt reports 10/10 pain in lower back, both chronic and acute. Pt has hx of Addison's disease. Pt reports feeling "foggy." Pt denies N/V/D. Pt reports sharp chest pain this morning but denies current chest pain. Pt A&Ox4 and in NAD.

## 2013-04-22 NOTE — ED Provider Notes (Signed)
CSN: [id number]     Arrival date & time 04/22/13  1427 History   First MD Initiated Contact with Patient 04/22/13 1427     Chief Complaint  Patient presents with  . Fall     (Consider location/radiation/quality/duration/timing/severity/associated sxs/prior Treatment) Patient is a 57 y.o. female presenting with fall. The history is provided by the patient.  Fall Pertinent negatives include no chest pain, no abdominal pain, no headaches and no shortness of breath.   patient just went back home from the nursing home yesterday or the day before. She states her home health nurse went in the house today and she was found to be hypotensive. Systolic pressures were in the 80s sitting and 60s on standing. Patient states she's been fatigued for the last few days. She states she has fallen 3 times yesterday and 3 times today. She states she thinks that she hurt her back more. She states she has chronic weakness in her legs it is worse. No headache. No chest pain. No fevers. She states she went to the pain management clinic for the second time today. She states that he did not change anything that she was not able to urinate for the drug test. She states she's been urinating normally up until today. She states she's not been taking her Xanax that she doesn't think she needs it. She denies headache. Patient states she's had good oral intake. She denies chest pain. She denies abdominal pain. Past Medical History  Diagnosis Date  . COPD (chronic obstructive pulmonary disease)   . Depression   . Chronic back pain     DDD, disc bulge, radiculopathy, spinal stenosis  . Hyperlipidemia   . Anxiety   . Bipolar disorder, unspecified   . Carotid artery calcification   . DDD (degenerative disc disease), lumbosacral   . Spinal stenosis, lumbar   . Thyroid disease   . Autoimmune thyroiditis   . Hypertension   . TIA (transient ischemic attack)     august 2014  . GERD (gastroesophageal reflux disease)   . H/O  dizziness   . Neuropathic pain   . Adrenal gland dysfunction     Addison's disease ON DAILY PREDNISONE  . Addison's disease   . Stroke     HX OF MINI STROKE AUG 2014- DROPPIN EVERYTHING, SPEECH SLURRED, MEMORY AFFECTED --ALL RESOLVED - NO DEFICITS NOW  . Pneumonia     LAST PNEUMONIA WAS AUG 2014  . Achalasia   . Mouth ulcers   . Dysrhythmia     palpitations  . Melanoma     removed 1 week ago at Dr Teryl Lucy office   Past Surgical History  Procedure Laterality Date  . Appendectomy    . Cholecystectomy    . Lump left breast      removed- benign  . Abdominal hysterectomy      tubal pregnancy  . Inguinal hernia repair    . Stomach surgery      ?holes in esophgous  . Ectopic pregnancy surgery    . Back surgery      x 5;1984;1989;;1999;2000;2010  . Colonoscopy  01/25/2012    Procedure: COLONOSCOPY;  Surgeon: Rogene Houston, MD;  Location: [facility];  Service: Endoscopy;  Laterality: N/A;  130  . Esophagogastroduodenoscopy (egd) with esophageal dilation N/A 08/13/2012    Procedure: ESOPHAGOGASTRODUODENOSCOPY (EGD) WITH ESOPHAGEAL DILATION;  Surgeon: Rogene Houston, MD;  Location: [facility];  Service: Endoscopy;  Laterality: N/A;  315  . Breast surgery  left breast  . Anterior cervical decomp/discectomy fusion N/A 10/21/2012    Procedure: Cervical Six-Seven Anterior cervical decompression/diskectomy/fusion;  Surgeon: Kristeen Miss, MD;  Location: [facility];  Service: Neurosurgery;  Laterality: N/A;  Cervical Six-Seven Anterior cervical decompression/diskectomy/fusion  . Cataract extraction w/phaco Right 11/25/2012    Procedure: CATARACT EXTRACTION PHACO AND INTRAOCULAR LENS PLACEMENT (IOC);  Surgeon: Elta Guadeloupe T. Gershon Crane, MD;  Location: [facility];  Service: Ophthalmology;  Laterality: Right;  CDE:10.06  . Cataract extraction w/phaco Left 12/09/2012    Procedure: CATARACT EXTRACTION PHACO AND INTRAOCULAR LENS PLACEMENT (IOC);  Surgeon: Elta Guadeloupe T. Gershon Crane, MD;  Location: [facility];   Service: Ophthalmology;  Laterality: Left;  CDE:5.06  . Tonsillectomy    . Hiatal hernia repair    . Heller myotomy N/A 03/17/2013    Procedure: DIAGNOSTIC LAPAROSCOPY, LAPAROSCOPIC HELLER MYOTOMY, ENDOSCOPY, DOR FUNDOPLICATION;  Surgeon: Ralene Ok, MD;  Location: [facility];  Service: General;  Laterality: N/A;   Family History  Problem Relation Age of Onset  . Hypertension Mother   . Heart disease Mother   . Heart attack Mother   . Anxiety disorder Mother   . Cancer Father     lung cancer  . Heart attack Father   . Alcohol abuse Father   . Hypertension Brother   . Alcohol abuse Brother   . Bipolar disorder Brother   . ADD / ADHD Brother   . Drug abuse Brother   . OCD Brother   . Alcohol abuse Sister   . Bipolar disorder Sister   . Anxiety disorder Sister   . ADD / ADHD Sister   . Drug abuse Sister   . Physical abuse Sister   . Sexual abuse Sister   . Alcohol abuse Brother   . Bipolar disorder Brother   . ADD / ADHD Brother   . Drug abuse Brother   . Alcohol abuse Sister   . Bipolar disorder Sister   . Anxiety disorder Sister   . ADD / ADHD Sister   . Physical abuse Sister   . Sexual abuse Sister   . Alcohol abuse Brother   . Bipolar disorder Brother   . ADD / ADHD Brother   . Bipolar disorder Brother   . ADD / ADHD Brother   . Alcohol abuse Brother   . Paranoid behavior Brother   . Physical abuse Brother   . Sexual abuse Brother   . Bipolar disorder Maternal Aunt   . Bipolar disorder Paternal Aunt   . Bipolar disorder Maternal Uncle   . Bipolar disorder Paternal Uncle   . Bipolar disorder Maternal Grandfather   . Alcohol abuse Maternal Grandfather   . Bipolar disorder Maternal Grandmother   . Alcohol abuse Maternal Grandmother   . Anxiety disorder Maternal Grandmother   . Dementia Maternal Grandmother   . Bipolar disorder Paternal Grandfather   . Alcohol abuse Paternal Grandfather   . Bipolar disorder Paternal Grandmother   . Alcohol abuse Paternal  Grandmother   . Drug abuse Paternal Grandmother   . Bipolar disorder Maternal Uncle   . Schizophrenia Neg Hx   . Seizures Neg Hx    History  Substance Use Topics  . Smoking status: Former Smoker -- 0.25 packs/day for 40 years    Types: Cigarettes    Quit date: 03/05/2013  . Smokeless tobacco: Never Used     Comment: 5 cigs day 03/12/13  . Alcohol Use: No   OB History   Grav Para Term Preterm Abortions TAB SAB Ect  Mult Living   2 1 1  1   1  1      Review of Systems  Constitutional: Positive for fatigue. Negative for activity change and appetite change.  Eyes: Negative for pain.  Respiratory: Negative for chest tightness and shortness of breath.   Cardiovascular: Negative for chest pain and leg swelling.  Gastrointestinal: Negative for nausea, vomiting, abdominal pain and diarrhea.  Genitourinary: Negative for flank pain.  Musculoskeletal: Positive for back pain. Negative for neck stiffness.  Skin: Negative for rash.  Neurological: Positive for weakness. Negative for numbness and headaches.  Psychiatric/Behavioral: Negative for behavioral problems.      Allergies  Codeine; Cyclobenzaprine; Darvocet; Abilify; Ace inhibitors; Adhesive; Gabapentin; Iodine; Levofloxacin; Prednisone; Sulfa antibiotics; and Robaxin  Home Medications   Prior to Admission medications   Medication Sig Start Date End Date Taking? Authorizing Provider  albuterol (PROVENTIL HFA;VENTOLIN HFA) 108 (90 BASE) MCG/ACT inhaler Inhale 2 puffs into the lungs 3 (three) times daily as needed for wheezing or shortness of breath.    Historical Provider, MD  albuterol (PROVENTIL) (2.5 MG/3ML) 0.083% nebulizer solution Take 2.5 mg by nebulization every 4 (four) hours as needed for wheezing. 03/16/13   Alycia Rossetti, MD  amitriptyline (ELAVIL) 100 MG tablet Take 1 tablet (100 mg total) by mouth at bedtime. 02/12/13   Levonne Spiller, MD  baclofen (LIORESAL) 10 MG tablet Take 10 mg by mouth 2 (two) times daily.     Historical Provider, MD  carbamide peroxide (DEBROX) 6.5 % otic solution Place 5 drops into the left ear 2 (two) times daily. For 3 days 04/17/13   Alycia Rossetti, MD  clonazePAM (KLONOPIN) 1 MG tablet Take 1 tablet (1 mg total) by mouth 4 (four) times daily as needed for anxiety. 04/13/13   Annita Brod, MD  DULoxetine (CYMBALTA) 60 MG capsule Take 1 capsule (60 mg total) by mouth every morning. 02/12/13   Levonne Spiller, MD  escitalopram (LEXAPRO) 20 MG tablet Take 1 tablet (20 mg total) by mouth every morning. 02/12/13 02/12/14  Levonne Spiller, MD  hydrochlorothiazide (HYDRODIURIL) 25 MG tablet Take 25 mg by mouth every morning. 03/02/13   Historical Provider, MD  metoprolol tartrate (LOPRESSOR) 25 MG tablet Take 1 tablet (25 mg total) by mouth 2 (two) times daily with a meal. 04/13/13   Annita Brod, MD  oxybutynin (DITROPAN XL) 10 MG 24 hr tablet Take 1 tablet (10 mg total) by mouth daily. 02/27/13   Alycia Rossetti, MD  oxyCODONE-acetaminophen (ROXICET) 5-325 MG per tablet Take 1 tablet by mouth 2 (two) times daily as needed for severe pain. 04/17/13   Alycia Rossetti, MD  pravastatin (PRAVACHOL) 40 MG tablet Take 1 tablet (40 mg total) by mouth every evening. 02/27/13   Alycia Rossetti, MD  predniSONE (DELTASONE) 2.5 MG tablet Take 3 tablets (7.5 mg total) by mouth daily with breakfast. 02/27/13   Alycia Rossetti, MD  tiotropium (SPIRIVA) 18 MCG inhalation capsule Place 1 capsule (18 mcg total) into inhaler and inhale daily. USES IN THE EVENING 02/27/13   Alycia Rossetti, MD   BP 77/41  Pulse 108  Temp(Src) 98 F (36.7 C) (Oral)  Resp 20  Ht 5\' 7"  (1.702 m)  Wt 190 lb (86.183 kg)  BMI 29.75 kg/m2  SpO2 96% Physical Exam  Constitutional: She is oriented to person, place, and time. She appears well-developed and well-nourished.  HENT:  Head: Normocephalic and atraumatic.  Eyes: Pupils are equal, round, and reactive  to light.  Cardiovascular: Normal rate and regular rhythm.    Pulmonary/Chest: Effort normal.  Abdominal: Soft. There is no tenderness.  Musculoskeletal: She exhibits tenderness.  Mild lumbar tenderness. No step-off or deformity. Some pain in her back straight leg raise on either side.  Neurological: She is alert and oriented to person, place, and time.  Skin: Skin is warm.    ED Course  Procedures (including critical care time) Labs Review Labs Reviewed  CBC WITH DIFFERENTIAL - Abnormal; Notable for the following:    Platelets 402 (*)    All other components within normal limits  URINALYSIS, ROUTINE W REFLEX MICROSCOPIC - Abnormal; Notable for the following:    Hgb urine dipstick TRACE (*)    Protein, ur 30 (*)    Leukocytes, UA MODERATE (*)    All other components within normal limits  COMPREHENSIVE METABOLIC PANEL - Abnormal; Notable for the following:    Potassium 3.6 (*)    Chloride 93 (*)    Glucose, Bld 111 (*)    Creatinine, Ser 1.77 (*)    Albumin 3.4 (*)    AST 51 (*)    GFR calc non Af Amer 31 (*)    GFR calc Af Amer 36 (*)    All other components within normal limits  URINE MICROSCOPIC-ADD ON - Abnormal; Notable for the following:    Squamous Epithelial / LPF FEW (*)    Bacteria, UA FEW (*)    Casts GRANULAR CAST (*)    All other components within normal limits  URINE CULTURE  CULTURE, BLOOD (ROUTINE X 2)  CULTURE, BLOOD (ROUTINE X 2)  MRSA PCR SCREENING  LACTIC ACID, PLASMA  CORTISOL    Imaging Review Dg Chest 2 View  04/22/2013   CLINICAL DATA:  Status post fall.  Hypotension.  EXAM: CHEST  2 VIEW  COMPARISON:  February 09, 2013  FINDINGS: The heart size and mediastinal contours are within normal limits. There is no focal infiltrate, pulmonary edema, or pleural effusion. The visualized skeletal structures are stable.  IMPRESSION: No active cardiopulmonary disease.   Electronically Signed   By: Abelardo Diesel M.D.   On: 04/22/2013 16:08     EKG Interpretation   Date/Time:  Wednesday April 22 2013 14:30:10  EDT Ventricular Rate:  83 PR Interval:  140 QRS Duration: 84 QT Interval:  376 QTC Calculation: 441 R Axis:   -7 Text Interpretation:  Normal sinus rhythm Possible Left atrial enlargement  Nonspecific T wave abnormality Abnormal ECG When compared with ECG of  10-Apr-2013 07:28, Nonspecific T wave abnormality now evident in Lateral  leads Confirmed by Reyn Faivre  MD, Ovid Curd [phone]) on 04/22/2013 3:03:40 PM      MDM   Final diagnoses:  UTI (urinary tract infection)  Hypotension  Acute kidney injury    History generalized weakness and hypotension. May have component dehydration. Creatinine has increased. As urinary tract infection and patient has been admitted to the hospital 4 times in the last month. She does go to the nursing home yesterday. Her resting blood pressures increase, however she was still orthostatic. Will admit to internal medicine for lactic acid is normal    Ovid Curd R. Alvino Chapel, MD 04/22/13 2227

## 2013-04-22 NOTE — Telephone Encounter (Signed)
Message copied by Sheral Flow on Wed Apr 22, 2013  2:00 PM ------      Message from: Devoria Glassing      Created: Wed Apr 22, 2013  1:51 PM       Pamala Hurry from home health calling in regards to this patient not being able to function at home please call her at [phone] ------

## 2013-04-23 DIAGNOSIS — E2749 Other adrenocortical insufficiency: Secondary | ICD-10-CM

## 2013-04-23 LAB — CBC
HCT: 40.6 % (ref 36.0–46.0)
Hemoglobin: 13.2 g/dL (ref 12.0–15.0)
MCH: 30.9 pg (ref 26.0–34.0)
MCHC: 32.5 g/dL (ref 30.0–36.0)
MCV: 95.1 fL (ref 78.0–100.0)
PLATELETS: 373 10*3/uL (ref 150–400)
RBC: 4.27 MIL/uL (ref 3.87–5.11)
RDW: 13.8 % (ref 11.5–15.5)
WBC: 6.5 10*3/uL (ref 4.0–10.5)

## 2013-04-23 LAB — BASIC METABOLIC PANEL
BUN: 16 mg/dL (ref 6–23)
CO2: 30 mEq/L (ref 19–32)
Calcium: 8.9 mg/dL (ref 8.4–10.5)
Chloride: 97 mEq/L (ref 96–112)
Creatinine, Ser: 1.27 mg/dL — ABNORMAL HIGH (ref 0.50–1.10)
GFR calc Af Amer: 53 mL/min — ABNORMAL LOW (ref >90)
GFR calc non Af Amer: 46 mL/min — ABNORMAL LOW (ref >90)
Glucose, Bld: 128 mg/dL — ABNORMAL HIGH (ref 70–99)
Potassium: 3.8 mEq/L (ref 3.7–5.3)
SODIUM: 139 meq/L (ref 137–147)

## 2013-04-23 LAB — MRSA PCR SCREENING: MRSA BY PCR: NEGATIVE

## 2013-04-23 LAB — CORTISOL: Cortisol, Plasma: 13.3 ug/dL

## 2013-04-23 MED ORDER — HYDROCORTISONE NA SUCCINATE PF 100 MG IJ SOLR
50.0000 mg | Freq: Two times a day (BID) | INTRAMUSCULAR | Status: DC
  Administered 2013-04-23 – 2013-04-24 (×2): 50 mg via INTRAVENOUS
  Filled 2013-04-23 (×2): qty 2

## 2013-04-23 MED ORDER — METOPROLOL TARTRATE 25 MG PO TABS
25.0000 mg | ORAL_TABLET | Freq: Two times a day (BID) | ORAL | Status: DC
  Administered 2013-04-23 – 2013-04-24 (×3): 25 mg via ORAL
  Filled 2013-04-23 (×4): qty 1

## 2013-04-23 MED ORDER — LIDOCAINE 5 % EX PTCH
MEDICATED_PATCH | CUTANEOUS | Status: AC
  Filled 2013-04-23: qty 1

## 2013-04-23 NOTE — Progress Notes (Signed)
Patient is currently active with [facility] for chronic disease management services.  Patient has been engaged by a [employer].  Patient will receive a post discharge transition of care call and will be evaluated for monthly home visits for assessments and disease process education.  Our recommendation at this time is SNF for rehabilitation and further treatment due to her poor safety awareness and history of injury at home.  Made Inpatient Case Manager aware that [facility] following. Of note, [facility] Care Management services does not replace or interfere with any services that are arranged by inpatient case management or social work.  For additional questions or referrals please contact Corliss Blacker BSN RN [facility] Liaison at [phone].

## 2013-04-23 NOTE — Progress Notes (Signed)
[facility] PROGRESS NOTE  Tonya Sullivan:[id number] DOB: 08-28-1955 DOA: 04/22/2013 PCP: Vic Blackbird, MD  Assessment/Plan: 1. Orthostatic hypotension. Appears to be related to dehydration and volume depletion.  Labs are improving after hydration. Patient is still somewhat symptomatic, so will continue with IV fluids. Blood pressures appear to be improving. Recheck orthostatics in am. 2. Dehydration.  Continue IV fluids 3. Possible UTI.  Continue rocephin and follow up urine culture 4. Adrenal insufficiency.  Received stress dose steroids of hydrocortisone.  Reports compliance with outpatient dose of prednisone. Serum cortisol level checked in ED was normal range.  Will do rapid taper of steroids back to home dose. 5. Acute renal failure, likely related to dehydration.  Improving with IV fluids.  Code Status: full code Family Communication: discussed with patient, no family present Disposition Plan: discharge home once improved   Consultants:  Procedures:    Antibiotics:  Rocephin 4/22  HPI/Subjective: Still feels dizzy on standing, overall feeling better  Objective: Filed Vitals:   04/23/13 0800  BP: 127/88  Pulse:   Temp: 97.8 F (36.6 C)  Resp: 18    Intake/Output Summary (Last 24 hours) at 04/23/13 0946 Last data filed at 04/23/13 0600  Gross per 24 hour  Intake 713.33 ml  Output      0 ml  Net 713.33 ml   Filed Weights   04/22/13 2230 04/22/13 2240 04/23/13 0500  Weight: 85.7 kg (188 lb 15 oz) 85.7 kg (188 lb 15 oz) 87 kg (191 lb 12.8 oz)    Exam:   General:  NAD  Cardiovascular: S1, S2 tachycardic  Respiratory: cta b  Abdomen: soft, nt, nd, bs+  Musculoskeletal: no edema b/l   Data Reviewed: Basic Metabolic Panel:  Recent Labs Lab 04/17/13 1525 04/22/13 1511 04/23/13 0428  NA 136 138 139  K 4.3 3.6* 3.8  CL 93* 93* 97  CO2 32 32 30  GLUCOSE 94 111* 128*  BUN 11 17 16   CREATININE 0.92 1.77* 1.27*  CALCIUM 9.4 9.5 8.9    Liver Function Tests:  Recent Labs Lab 04/17/13 1525 04/22/13 1511  AST 21 51*  ALT 24 30  ALKPHOS 60 74  BILITOT 0.3 0.4  PROT 6.8 7.3  ALBUMIN 4.0 3.4*   No results found for this basename: LIPASE, AMYLASE,  in the last 168 hours No results found for this basename: AMMONIA,  in the last 168 hours CBC:  Recent Labs Lab 04/17/13 1525 04/22/13 1511 04/23/13 0428  WBC 7.1 9.0 6.5  NEUTROABS 5.1 6.7  --   HGB 13.2 14.6 13.2  HCT 39.2 43.9 40.6  MCV 92.2 94.6 95.1  PLT 402* 402* 373   Cardiac Enzymes: No results found for this basename: CKTOTAL, CKMB, CKMBINDEX, TROPONINI,  in the last 168 hours BNP (last 3 results)  Recent Labs  08/03/12 0850 10/25/12 0930  PROBNP 745.1* 209.0*   CBG: No results found for this basename: GLUCAP,  in the last 168 hours  Recent Results (from the past 240 hour(s))  CULTURE, BLOOD (ROUTINE X 2)     Status: None   Collection Time    04/22/13 10:19 PM      Result Value Ref Range Status   Specimen Description BLOOD RIGHT ANTECUBITAL   Final   Special Requests     Final   Value: BOTTLES DRAWN AEROBIC AND ANAEROBIC AEB 6CC ANA 4CC   Culture PENDING   Incomplete   Report Status PENDING   Incomplete  CULTURE, BLOOD (ROUTINE X  2)     Status: None   Collection Time    04/22/13 10:19 PM      Result Value Ref Range Status   Specimen Description BLOOD RIGHT ANTECUBITAL   Final   Special Requests     Final   Value: BOTTLES DRAWN AEROBIC AND ANAEROBIC AEB 8CC ANA [place]   Culture PENDING   Incomplete   Report Status PENDING   Incomplete  MRSA PCR SCREENING     Status: None   Collection Time    04/22/13 10:30 PM      Result Value Ref Range Status   MRSA by PCR NEGATIVE  NEGATIVE Final   Comment:            The GeneXpert MRSA Assay (FDA     approved for NASAL specimens     only), is one component of a     comprehensive MRSA colonization     surveillance program. It is not     intended to diagnose MRSA     infection nor to guide or      monitor treatment for     MRSA infections.     Studies: Dg Chest 2 View  04/22/2013   CLINICAL DATA:  Status post fall.  Hypotension.  EXAM: CHEST  2 VIEW  COMPARISON:  February 09, 2013  FINDINGS: The heart size and mediastinal contours are within normal limits. There is no focal infiltrate, pulmonary edema, or pleural effusion. The visualized skeletal structures are stable.  IMPRESSION: No active cardiopulmonary disease.   Electronically Signed   By: Abelardo Diesel M.D.   On: 04/22/2013 16:08    Scheduled Meds: . amitriptyline  100 mg Oral QHS  . baclofen  10 mg Oral BID  . cefTRIAXone (ROCEPHIN)  IV  1 g Intravenous Q24H  . DULoxetine  60 mg Oral q morning - 10a  . escitalopram  20 mg Oral BH-q7a  . heparin  5,000 Units Subcutaneous 3 times per day  . hydrocortisone sod succinate (SOLU-CORTEF) inj  100 mg Intravenous 3 times per day  . lidocaine  1 patch Transdermal Q12H  . oxybutynin  10 mg Oral Daily  . pantoprazole  40 mg Oral Daily  . simvastatin  20 mg Oral q1800  . sodium chloride  3 mL Intravenous Q12H  . tiotropium  18 mcg Inhalation q1800   Continuous Infusions: . 0.9 % NaCl with KCl 40 mEq / L 100 mL/hr at 04/23/13 0600    Principal Problem:   Orthostatic hypotension Active Problems:   Depression   Chronic pain   COPD (chronic obstructive pulmonary disease)   Tobacco use   Bipolar disorder   Fall   TIA (transient ischemic attack)   Adrenal cortical insufficiency   AKI (acute kidney injury)   UTI (urinary tract infection)    Time spent: 45mins    Tonya Sullivan  [facility] Pager [phone]. If 7PM-7AM, please contact night-coverage at [email or web address], password [facility] 04/23/2013, 9:46 AM  LOS: 1 day

## 2013-04-23 NOTE — Care Management Utilization Note (Signed)
UR completed 

## 2013-04-23 NOTE — Progress Notes (Signed)
Report given to L.Foote,LPN. Patient transferred to 319 in stable condition. Home medications sent to pharmacy.

## 2013-04-23 NOTE — Evaluation (Signed)
Physical Therapy Evaluation Patient Details Name: Tonya Sullivan MRN: [id number] DOB: 1955/06/11 Today's Date: 04/23/2013   History of Present Illness  Pt has had multiple admissions in past 6 months for a myriad of problems.  Last admission was on 5-0-93 for metabolic encephalopathy and she was d/c'd to SNF.  She could not have been there but a few days as she has now been admitted from home due to orthostatic hypotension.  She lives alone and previous PT notes indicate that her son did not feel that pt was capable of living successfully on her own.  Pt has many medical problems including Bipolar disorder, COPD, spinal stenosis (with multiple lumbar surgical procedures), HTN, TIA and ACDF.  She is now found to have acute kidney injury and UTI.  Pt lives alone in an apartment and has a close friend who, by her accout, helps her out.                                                          .  Clinical Impression   Pt was seen for evaluation.  She was initially very drowsy but was able to awaken.  She was pleasant and cooperative.   Her strength was WNL but her dynamic standing balance is somewhat decreased.  She has had multiple falls at home, recently due to orthostasis.  Today, she had no hypotension or sx thereof.  She was independent in transfers and able to ambulate 150' with no assistive device or LOB.  Her gait is mildly "clumsy" with a wide based and flat footed gait pattern.  I did not challenge her gait but she usually uses a cane for gait.  Because of MANY recent hospitalizations, especially recently, I do not feel that she is managing well at home.  Her Psychiatric disorders are probably her biggest problems and she would probably thrive best in a group home or ACLF.  She will probably be resistant to this. We can try HHPT and add MSW service to assess her home situation.    Follow Up Recommendations Home health PT;Supervision - Intermittent    Equipment Recommendations  None recommended by  PT    Recommendations for Other Services   none    Precautions / Restrictions Precautions Precautions: Fall Precaution Comments: mild unsteadiness with gait Restrictions Weight Bearing Restrictions: No      Mobility  Bed Mobility Overal bed mobility: Independent Bed Mobility: Supine to Sit     Supine to sit: Independent        Transfers Overall transfer level: Independent Equipment used: None Transfers: Sit to/from Stand Sit to Stand: Modified independent (Device/Increase time)            Ambulation/Gait Ambulation/Gait assistance: Supervision Ambulation Distance (Feet): 150 Feet Assistive device: None Gait Pattern/deviations: Wide base of support   Gait velocity interpretation: at or above normal speed for age/gender General Gait Details: gait is mildly "clumsy" with decreased PF at push off  Stairs:N/T                Balance Overall balance assessment: Independent Sitting-balance support: No upper extremity supported;Feet supported       Standing balance support: No upper extremity supported;During functional activity Standing balance-Leahy Scale: Fair  Home Living Family/patient expects to be discharged to:: Assisted living                      Prior Function Level of Independence: Independent with assistive device(s)         Comments: uses a cane             Extremity/Trunk Assessment               Lower Extremity Assessment: Overall WFL for tasks assessed         Communication   Communication: Expressive difficulties (slurred speech)  Cognition Arousal/Alertness: Awake/alert Behavior During Therapy: WFL for tasks assessed/performed Overall Cognitive Status: Within Functional Limits for tasks assessed         Following Commands: Follows one step commands consistently                          Assessment/Plan    PT Assessment All further PT  needs can be met in the next venue of care  PT Diagnosis Difficulty walking   PT Problem List Decreased balance  PT Treatment Interventions     PT Goals (Current goals can be found in the Care Plan section) Acute Rehab PT Goals PT Goal Formulation: No goals set, d/c therapy    Frequency     Barriers to discharge Decreased caregiver support                     End of Session Equipment Utilized During Treatment: Gait belt Activity Tolerance: Patient tolerated treatment well Patient left: in chair;with call bell/phone within reach Nurse Communication: Mobility status         Time: 1686-1042 PT Time Calculation (min): 37 min   Charges:   PT Evaluation $Initial PT Evaluation Tier I: 1 Procedure     PT G Codes:          Sable Feil 04/23/2013, 2:30 PM

## 2013-04-24 ENCOUNTER — Ambulatory Visit (HOSPITAL_COMMUNITY): Payer: Self-pay | Admitting: Psychiatry

## 2013-04-24 DIAGNOSIS — I959 Hypotension, unspecified: Secondary | ICD-10-CM

## 2013-04-24 DIAGNOSIS — G8929 Other chronic pain: Secondary | ICD-10-CM

## 2013-04-24 DIAGNOSIS — I10 Essential (primary) hypertension: Secondary | ICD-10-CM

## 2013-04-24 LAB — BASIC METABOLIC PANEL
BUN: 12 mg/dL (ref 6–23)
CO2: 29 mEq/L (ref 19–32)
Calcium: 8.6 mg/dL (ref 8.4–10.5)
Chloride: 105 mEq/L (ref 96–112)
Creatinine, Ser: 1.08 mg/dL (ref 0.50–1.10)
GFR, EST AFRICAN AMERICAN: 65 mL/min — AB (ref >90)
GFR, EST NON AFRICAN AMERICAN: 56 mL/min — AB (ref >90)
GLUCOSE: 100 mg/dL — AB (ref 70–99)
POTASSIUM: 3.9 meq/L (ref 3.7–5.3)
SODIUM: 143 meq/L (ref 137–147)

## 2013-04-24 LAB — URINE CULTURE

## 2013-04-24 MED ORDER — PANTOPRAZOLE SODIUM 40 MG PO TBEC: 40.0000 mg | DELAYED_RELEASE_TABLET | Freq: Every day | ORAL | Status: DC

## 2013-04-24 MED ORDER — PREDNISONE 2.5 MG PO TABS: 7.5000 mg | ORAL_TABLET | Freq: Every day | ORAL | Status: DC

## 2013-04-24 MED ORDER — KETOROLAC TROMETHAMINE 30 MG/ML IJ SOLN
30.0000 mg | Freq: Four times a day (QID) | INTRAMUSCULAR | Status: DC | PRN
  Administered 2013-04-24: 30 mg via INTRAVENOUS
  Filled 2013-04-24: qty 1

## 2013-04-24 NOTE — Discharge Summary (Signed)
Physician Discharge Summary  Tonya Sullivan GEX:[id number] DOB: Mar 26, 1955 DOA: 04/22/2013  PCP: Vic Blackbird, MD  Admit date: 04/22/2013 Discharge date: 04/24/2013  Time spent: 40 minutes  Recommendations for Outpatient Follow-up:  1. Followup with primary care physician in one to 2 weeks 2. Patient was offered placement in an assisted-living facility, but has declined for the time being. She's not felt to be an appropriate candidate for skilled nursing facility placement for physical therapy 3. Patient had propranolol and metoprolol on her prior to admission medications. Propranolol has been discontinued and metoprolol is continued. 4. Losartan has been held since patient is normotensive. This can be resumed in the outpatient setting as felt appropriate. 5. Hydrochlorothiazide was also discontinued since patient did present with dehydration. This may be resumed in the outpatient setting as felt appropriate.  Discharge Diagnoses:  Principal Problem:   Orthostatic hypotension Active Problems:   Depression   Chronic pain   COPD (chronic obstructive pulmonary disease)   Tobacco use   Bipolar disorder   Fall   Adrenal cortical insufficiency   AKI (acute kidney injury)   UTI (urinary tract infection)   Discharge Condition: Improved  Diet recommendation: Low salt  Filed Weights   04/22/13 2230 04/22/13 2240 04/23/13 0500  Weight: 85.7 kg (188 lb 15 oz) 85.7 kg (188 lb 15 oz) 87 kg (191 lb 12.8 oz)    History of present illness:  58 year old female with history of adrenal insufficiency on chronic low-dose prednisone, bipolar disorder, chronic back pain, hypertension, GERD, COPD who was recently hospitalized for altered mental status unclear etiology when she was also evaluated by psychiatry and medications adjusted and was sent to skilled nursing facility . She was discharged from there few days back and went to the pain clinic today and reportedly had a fall there. Patient denies  loss of consciousness or hitting her head and reports that she slumped on the ground and was aware about her surroundings. She does reports feeling foggy. Her blood pressure was found to be 90/60 on sitting and 50 systolic upon standing. Patient's PCP was called by her home health and was instructed to call EMS and bring her to ED. in the ED the patient's blood pressure was 127/91 mmHg on lying down but dropped to 77/41 mmHg on sitting up. Her pulse was 108, and she was afebrile.. Blood work done showed a normal CBC, mild hypokalemia with potassium of 2.6, chloride of 93 and elevated creatinine of 1.77. Blood glucose was 111. Chest x-ray was unremarkable. UA was suggestive of UTI. Patient given 1 L IV normal saline bolus and one dose of IV Zosyn and [facility] called for admission to telemetry. Patient denies any recent illness or missing out on her medications.  Patient is not a good historian. Patient denies headache, fever, chills, nausea , vomiting, chest pain, palpitations, SOB, abdominal pain, bowel or urinary symptoms. Denies change in weight or appetite.   Hospital Course:  This patient was admitted to the hospital with generalized weakness, fall and orthostatic hypotension. She was noted to be significantly dehydrated and had acute renal failure. The patient was admitted to the hospital and started on intravenous fluids. As her volume status improved her orthostasis resolved. Renal function also returned back to baseline. She was initially given stress dose steroids, but now has been changed back to her usual dose of prednisone 7.5 mg daily which she takes chronically for adrenal insufficiency. UA initially indicated possible urinary tract infection and patient was  started on Rocephin. Urine culture did not show any specific growth. Patient has had multiple admissions to the hospital for various reasons. She was reporting frequent falls at home. She was evaluated by physical therapy who did not  feel the patient would be an appropriate candidate for skilled nursing facility. It was recommended that patient be discharged to an assisted-living facility. This was offered to the patient, but she declines at this time. [facility]. We will set her up with home health nurse, physical therapy as well as a Education officer, museum, in case she does change her mind and wishes to pursue assisted living placement. She is otherwise stable for discharge.  Procedures:    Consultations:    Discharge Exam: Filed Vitals:   04/24/13 1506  BP: 129/66  Pulse: 73  Temp: 97.8 F (36.6 C)  Resp: 20    General: No acute distress  Cardiovascular: S1, S2, regular rate and rhythm Respiratory: Clear to auscultation bilaterally  Discharge Instructions You were cared for by a hospitalist during your hospital stay. If you have any questions about your discharge medications or the care you received while you were in the hospital after you are discharged, you can call the unit and asked to speak with the hospitalist on call if the hospitalist that took care of you is not available. Once you are discharged, your primary care physician will handle any further medical issues. Please note that NO REFILLS for any discharge medications will be authorized once you are discharged, as it is imperative that you return to your primary care physician (or establish a relationship with a primary care physician if you do not have one) for your aftercare needs so that they can reassess your need for medications and monitor your lab values.  Discharge Orders   Future Appointments Provider Department Dept Phone   05/04/2013 10:45 AM Ralene Ok, MD [facility], [place] [phone]   Future Orders Complete By Expires   Call MD for:  extreme fatigue  As directed    Call MD for:  persistant dizziness or light-headedness  As directed    Diet - low sodium heart healthy  As directed    Face-to-face  encounter (required for Medicare/Medicaid patients)  As directed    Questions:     The encounter with the patient was in whole, or in part, for the following medical condition, which is the primary reason for home health care:  dehydration   I certify that, based on my findings, the following services are medically necessary home health services:  Nursing   Physical therapy   My clinical findings support the need for the above services:  Pain interferes with ambulation/mobility   Further, I certify that my clinical findings support that this patient is homebound due to:  Pain interferes with ambulation/mobility   Reason for Medically Necessary Home Health Services:  [facility]  As directed    Questions:     To provide the following care/treatments:  PT   RN   Social work   Increase activity slowly  As directed        Medication List    STOP taking these medications       ampicillin 500 MG capsule  Commonly known as:  PRINCIPEN     hydrochlorothiazide 25 MG tablet  Commonly known as:  HYDRODIURIL     losartan 50 MG tablet  Commonly known as:  COZAAR  propranolol 10 MG tablet  Commonly known as:  INDERAL      TAKE these medications       albuterol 108 (90 BASE) MCG/ACT inhaler  Commonly known as:  PROVENTIL HFA;VENTOLIN HFA  Inhale 2 puffs into the lungs 3 (three) times daily as needed for wheezing or shortness of breath.     albuterol (2.5 MG/3ML) 0.083% nebulizer solution  Commonly known as:  PROVENTIL  Take 2.5 mg by nebulization every 4 (four) hours as needed for wheezing.     amitriptyline 100 MG tablet  Commonly known as:  ELAVIL  Take 1 tablet (100 mg total) by mouth at bedtime.     baclofen 10 MG tablet  Commonly known as:  LIORESAL  Take 10 mg by mouth 2 (two) times daily.     clonazePAM 1 MG tablet  Commonly known as:  KLONOPIN  Take 1 tablet (1 mg total) by mouth 4 (four) times daily as needed for  anxiety.     DULoxetine 60 MG capsule  Commonly known as:  CYMBALTA  Take 1 capsule (60 mg total) by mouth every morning.     escitalopram 20 MG tablet  Commonly known as:  LEXAPRO  Take 1 tablet (20 mg total) by mouth every morning.     fluocinonide gel 0.05 %  Commonly known as:  LIDEX  Apply 1 application topically 2 (two) times daily.     lidocaine 2 % solution  Commonly known as:  XYLOCAINE  Use as directed 20 mLs in the mouth or throat every 4 (four) hours as needed for mouth pain.     lidocaine 5 %  Commonly known as:  LIDODERM  Place 1 patch onto the skin every 12 (twelve) hours.     metoprolol tartrate 25 MG tablet  Commonly known as:  LOPRESSOR  Take 1 tablet (25 mg total) by mouth 2 (two) times daily with a meal.     oxybutynin 10 MG 24 hr tablet  Commonly known as:  DITROPAN XL  Take 1 tablet (10 mg total) by mouth daily.     pantoprazole 40 MG tablet  Commonly known as:  PROTONIX  Take 1 tablet (40 mg total) by mouth daily.     pravastatin 40 MG tablet  Commonly known as:  PRAVACHOL  Take 1 tablet (40 mg total) by mouth every evening.     predniSONE 2.5 MG tablet  Commonly known as:  DELTASONE  Take 3 tablets (7.5 mg total) by mouth daily with breakfast.     tiotropium 18 MCG inhalation capsule  Commonly known as:  SPIRIVA  Place 1 capsule (18 mcg total) into inhaler and inhale daily. USES IN THE EVENING       Allergies  Allergen Reactions  . Codeine Itching  . Cyclobenzaprine Hives  . Darvocet [Propoxyphene N-Acetaminophen] Rash  . Abilify [Aripiprazole] Other (See Comments)    tremors  . Ace Inhibitors   . Adhesive [Tape] Hives  . Gabapentin Swelling  . Iodine Hives  . Levofloxacin Hives and Hypertension  . Prednisone Hives, Itching and Hypertension  . Sulfa Antibiotics Itching  . Robaxin [Methocarbamol] Rash       Follow-up Information   Follow up with Vic Blackbird, MD. Schedule an appointment as soon as possible for a visit in 1  week.   Specialty:  Family Medicine   Contact information:   [address] [place] [address] [phone]        The results  of significant diagnostics from this hospitalization (including imaging, microbiology, ancillary and laboratory) are listed below for reference.    Significant Diagnostic Studies: Dg Chest 2 View  04/22/2013   CLINICAL DATA:  Status post fall.  Hypotension.  EXAM: CHEST  2 VIEW  COMPARISON:  February 09, 2013  FINDINGS: The heart size and mediastinal contours are within normal limits. There is no focal infiltrate, pulmonary edema, or pleural effusion. The visualized skeletal structures are stable.  IMPRESSION: No active cardiopulmonary disease.   Electronically Signed   By: Abelardo Diesel M.D.   On: 04/22/2013 16:08   Ct Head Wo Contrast  04/09/2013   CLINICAL DATA:  Lethargy.  EXAM: CT HEAD WITHOUT CONTRAST  TECHNIQUE: Contiguous axial images were obtained from the base of the skull through the vertex without intravenous contrast.  COMPARISON:  04/01/2013  FINDINGS: Skull and Sinuses:No significant abnormality.  Orbits: No acute abnormality.  Brain: No evidence of acute abnormality, such as acute infarction, hemorrhage, hydrocephalus, or mass lesion/mass effect. Prominent bifrontal extra-axial spaces consistent with cerebral volume loss- age advanced.  IMPRESSION: 1. No acute intracranial abnormality. 2. Brain atrophy.   Electronically Signed   By: Jorje Guild M.D.   On: 04/09/2013 00:32   Ct Head Wo Contrast  04/01/2013   CLINICAL DATA:  Altered mental status, slurred speech.  EXAM: CT HEAD WITHOUT CONTRAST  TECHNIQUE: Contiguous axial images were obtained from the base of the skull through the vertex without intravenous contrast.  COMPARISON:  CT HEAD W/O CM dated 03/14/2013  FINDINGS: Mild ventriculomegaly, likely on the basis of global parenchymal brain volume loss as there is overall commensurate enlargement of cerebral sulci and cerebellar folia, somewhat  prominent for age though, stable. Mildly prominent bifrontal extra-axial spaces suggests underlying parenchymal brain volume loss. No intraparenchymal hemorrhage, mass effect nor midline shift. No acute large vascular territory infarcts.  No abnormal extra-axial fluid collections. Basal cisterns are patent.  No skull fracture. Visualized paranasal sinuses and mastoid air-cells are well-aerated. The included ocular globes and orbital contents are non-suspicious. Soft tissue within the right greater than left external auditory canals likely reflects cerumen.  IMPRESSION: No acute intracranial process.  Mild parenchymal brain volume loss, advanced for age though stable from prior examination.   Electronically Signed   By: Elon Alas   On: 04/01/2013 02:55   Ct Soft Tissue Neck W Contrast  03/27/2013   CLINICAL DATA:  Recent achalasia surgery, feeling throat closing.  EXAM: CT NECK AND CHEST WITH CONTRAST  TECHNIQUE: Multidetector CT imaging of the neck and chest was performed using the standard protocol following the bolus administration of intravenous contrast.  CONTRAST:  144mL OMNIPAQUE IOHEXOL 300 MG/ML  SOLN  COMPARISON:  CT CHEST W/O CM dated 08/02/2012  FINDINGS: CT neck: Aerodigestive tract is unremarkable for this mildly motion degraded examination. Preservation of the parapharyngeal fat tissue planes. Normal appearance of the major salivary glands. Normal appearance of the thyroid gland which extends posteriorly, a normal variant.  Somewhat medialized course of the common carotid arteries, a normal variant with mild calcific atherosclerosis of the carotid bifurcations. No lymphadenopathy by CT size criteria. No free fluid or fluid collections within the neck.  Patient is edentulous. Visualized paranasal sinuses and mastoid air cells are well aerated. Broad reversed cervical lordosis, status post C6-7 ACDF.  CT chest: Minimally patulous thoracic esophagus, with minimal debris, no suspicious esophageal  distension, no air fluid levels or posterior mediastinal free air/fluid collections.  Heart and pericardium overall normal, with  mild Coronary artery calcifications. Mild calcific atherosclerosis of the thoracic aorta which normal in course and caliber. Tracheobronchial tree is patent and midline. No pneumothorax. Linear atelectasis versus scarring in the right upper/middle lobe. Resolution of tree-in-bud airspace opacities seen on prior CT. 7 mm right upper lobe pulmonary nodule, coronal 60/92, axial 21/61. No pleural effusions or focal consolidations.  Included view of the abdomen demonstrates cholecystectomy. Soft tissues and osseous structures are nonsuspicious. Straightened thoracic kyphosis with mild degenerative change.  IMPRESSION: CT neck: No acute process within the neck, normal appearance of the aerodigestive tract.  CT chest: Minimally patulous thoracic esophagus, with no suspicious esophageal distention or air-fluid levels. No posterior mediastinal fluid collections or masses.  If symptoms persist, dynamic study status upper GI may be of added value.  No acute cardiopulmonary process.  7 mm right upper lobe pulmonary nodule: If the patient is at high risk for bronchogenic carcinoma, follow-up chest CT at 3-41months is recommended. If the patient is at low risk for bronchogenic carcinoma, follow-up chest CT at 6-12 months is recommended. This recommendation follows the consensus statement: Guidelines for Management of Small Pulmonary Nodules Detected on CT Scans: A Statement from the [place] as published in Radiology 2005; 237:395-400.   Electronically Signed   By: Elon Alas   On: 03/27/2013 16:48   Ct Chest W Contrast  03/27/2013   CLINICAL DATA:  Recent achalasia surgery, feeling throat closing.  EXAM: CT NECK AND CHEST WITH CONTRAST  TECHNIQUE: Multidetector CT imaging of the neck and chest was performed using the standard protocol following the bolus administration of intravenous  contrast.  CONTRAST:  16mL OMNIPAQUE IOHEXOL 300 MG/ML  SOLN  COMPARISON:  CT CHEST W/O CM dated 08/02/2012  FINDINGS: CT neck: Aerodigestive tract is unremarkable for this mildly motion degraded examination. Preservation of the parapharyngeal fat tissue planes. Normal appearance of the major salivary glands. Normal appearance of the thyroid gland which extends posteriorly, a normal variant.  Somewhat medialized course of the common carotid arteries, a normal variant with mild calcific atherosclerosis of the carotid bifurcations. No lymphadenopathy by CT size criteria. No free fluid or fluid collections within the neck.  Patient is edentulous. Visualized paranasal sinuses and mastoid air cells are well aerated. Broad reversed cervical lordosis, status post C6-7 ACDF.  CT chest: Minimally patulous thoracic esophagus, with minimal debris, no suspicious esophageal distension, no air fluid levels or posterior mediastinal free air/fluid collections.  Heart and pericardium overall normal, with mild Coronary artery calcifications. Mild calcific atherosclerosis of the thoracic aorta which normal in course and caliber. Tracheobronchial tree is patent and midline. No pneumothorax. Linear atelectasis versus scarring in the right upper/middle lobe. Resolution of tree-in-bud airspace opacities seen on prior CT. 7 mm right upper lobe pulmonary nodule, coronal 60/92, axial 21/61. No pleural effusions or focal consolidations.  Included view of the abdomen demonstrates cholecystectomy. Soft tissues and osseous structures are nonsuspicious. Straightened thoracic kyphosis with mild degenerative change.  IMPRESSION: CT neck: No acute process within the neck, normal appearance of the aerodigestive tract.  CT chest: Minimally patulous thoracic esophagus, with no suspicious esophageal distention or air-fluid levels. No posterior mediastinal fluid collections or masses.  If symptoms persist, dynamic study status upper GI may be of added  value.  No acute cardiopulmonary process.  7 mm right upper lobe pulmonary nodule: If the patient is at high risk for bronchogenic carcinoma, follow-up chest CT at 3-59months is recommended. If the patient is at low risk for bronchogenic carcinoma, follow-up  chest CT at 6-12 months is recommended. This recommendation follows the consensus statement: Guidelines for Management of Small Pulmonary Nodules Detected on CT Scans: A Statement from the [place] as published in Radiology 2005; 237:395-400.   Electronically Signed   By: Elon Alas   On: 03/27/2013 16:48   Mr Brain Wo Contrast  04/01/2013   CLINICAL DATA:  Encephalopathy  EXAM: MRI HEAD WITHOUT CONTRAST  TECHNIQUE: Multiplanar, multiecho pulse sequences of the brain and surrounding structures were obtained without intravenous contrast.  COMPARISON:  CT 04/01/2013, MRI 08/03/2012  FINDINGS: Cerebral volume is normal for age. Ventricle size is normal. Craniocervical junction is normal. Pituitary is normal in size.  Negative for acute infarct. No significant chronic ischemia. Negative for hemorrhage or mass. No shift of the midline structures.  Paranasal sinuses are clear.  IMPRESSION: Negative MRI of the brain.   Electronically Signed   By: Franchot Gallo M.D.   On: 04/01/2013 13:44   Dg Chest Port 1 View  04/09/2013   CLINICAL DATA:  Respiratory distress.  EXAM: PORTABLE CHEST - 1 VIEW  COMPARISON:  DG CHEST 1V PORT dated 04/01/2013  FINDINGS: The cardiac silhouette appears mild to moderately enlarged, similar. Mediastinal silhouette is nonsuspicious, unchanged. Stable mild interstitial prominence with strandy densities in right lung base. No pleural effusions. Trachea projects midline and there is no pneumothorax.  ACDF. Multiple EKG lines overlie the patient and may obscure subtle underlying pathology.  IMPRESSION: Stable cardiomegaly and chronic interstitial changes with right lung base strandy densities likely reflecting atelectasis.    Electronically Signed   By: Elon Alas   On: 04/09/2013 02:57   Dg Chest Port 1 View  04/01/2013   CLINICAL DATA:  Evaluate for pulmonary infiltrates, weakness  EXAM: PORTABLE CHEST - 1 VIEW  COMPARISON:  CT CHEST W/CM dated 03/27/2013; DG CHEST 1V PORT dated 10/25/2012; DG CHEST 2 VIEW dated 10/13/2012  FINDINGS: The examination is degraded due to patient body habitus and portable technique.  Grossly unchanged cardiac silhouette and mediastinal contours given decreased lung volumes and patient rotation. There is persistent thickening of the right peritracheal stripe which is favored to be secondary to prominent vasculature. Grossly unchanged bibasilar heterogeneous opacities. Trace bilateral effusions are not excluded. Mild cephalization of flow without frank evidence of edema. No definite pneumothorax. Grossly unchanged bones including lower cervical ACDF.  IMPRESSION: Degraded examination with similar findings of bibasilar atelectasis/ scar. Further evaluation with a PA and lateral chest radiograph may be obtained as clinically indicated.   Electronically Signed   By: Sandi Mariscal M.D.   On: 04/01/2013 08:39    Microbiology: Recent Results (from the past 240 hour(s))  URINE CULTURE     Status: None   Collection Time    04/22/13  5:20 PM      Result Value Ref Range Status   Specimen Description URINE, CLEAN CATCH   Final   Special Requests NONE   Final   Culture  Setup Time     Final   Value: 04/23/2013 00:58     Performed at [facility]     Final   Value: 70,000 COLONIES/ML     Performed at [facility]   Culture     Final   Value: Multiple bacterial morphotypes present, none predominant. Suggest appropriate recollection if clinically indicated.     Performed at [facility]   Report Status 04/24/2013 FINAL   Final  CULTURE, BLOOD (ROUTINE X 2)  Status: None   Collection Time    04/22/13 10:19 PM      Result Value Ref Range Status    Specimen Description BLOOD RIGHT ANTECUBITAL   Final   Special Requests     Final   Value: BOTTLES DRAWN AEROBIC AND ANAEROBIC AEB 6CC ANA 4CC   Culture NO GROWTH 1 DAY   Final   Report Status PENDING   Incomplete  CULTURE, BLOOD (ROUTINE X 2)     Status: None   Collection Time    04/22/13 10:19 PM      Result Value Ref Range Status   Specimen Description BLOOD RIGHT ANTECUBITAL   Final   Special Requests     Final   Value: BOTTLES DRAWN AEROBIC AND ANAEROBIC AEB 8CC ANA [place]   Culture NO GROWTH 1 DAY   Final   Report Status PENDING   Incomplete  MRSA PCR SCREENING     Status: None   Collection Time    04/22/13 10:30 PM      Result Value Ref Range Status   MRSA by PCR NEGATIVE  NEGATIVE Final   Comment:            The GeneXpert MRSA Assay (FDA     approved for NASAL specimens     only), is one component of a     comprehensive MRSA colonization     surveillance program. It is not     intended to diagnose MRSA     infection nor to guide or     monitor treatment for     MRSA infections.     Labs: Basic Metabolic Panel:  Recent Labs Lab 04/22/13 1511 04/23/13 0428 04/24/13 0505  NA 138 139 143  K 3.6* 3.8 3.9  CL 93* 97 105  CO2 32 30 29  GLUCOSE 111* 128* 100*  BUN 17 16 12   CREATININE 1.77* 1.27* 1.08  CALCIUM 9.5 8.9 8.6   Liver Function Tests:  Recent Labs Lab 04/22/13 1511  AST 51*  ALT 30  ALKPHOS 74  BILITOT 0.4  PROT 7.3  ALBUMIN 3.4*   No results found for this basename: LIPASE, AMYLASE,  in the last 168 hours No results found for this basename: AMMONIA,  in the last 168 hours CBC:  Recent Labs Lab 04/22/13 1511 04/23/13 0428  WBC 9.0 6.5  NEUTROABS 6.7  --   HGB 14.6 13.2  HCT 43.9 40.6  MCV 94.6 95.1  PLT 402* 373   Cardiac Enzymes: No results found for this basename: CKTOTAL, CKMB, CKMBINDEX, TROPONINI,  in the last 168 hours BNP: BNP (last 3 results)  Recent Labs  08/03/12 0850 10/25/12 0930  PROBNP 745.1* 209.0*   CBG: No  results found for this basename: GLUCAP,  in the last 168 hours     Signed:  Kathie Dike  [facility] 04/24/2013, 7:54 PM

## 2013-04-24 NOTE — Discharge Instructions (Signed)
Dehydration, Adult Dehydration is when you lose more fluids from the body than you take in. Vital organs like the kidneys, brain, and heart cannot function without a proper amount of fluids and salt. Any loss of fluids from the body can cause dehydration.  CAUSES   Vomiting.  Diarrhea.  Excessive sweating.  Excessive urine output.  Fever. SYMPTOMS  Mild dehydration  Thirst.  Dry lips.  Slightly dry mouth. Moderate dehydration  Very dry mouth.  Sunken eyes.  Skin does not bounce back quickly when lightly pinched and released.  Dark urine and decreased urine production.  Decreased tear production.  Headache. Severe dehydration  Very dry mouth.  Extreme thirst.  Rapid, weak pulse (more than 100 beats per minute at rest).  Cold hands and feet.  Not able to sweat in spite of heat and temperature.  Rapid breathing.  Blue lips.  Confusion and lethargy.  Difficulty being awakened.  Minimal urine production.  No tears. DIAGNOSIS  Your caregiver will diagnose dehydration based on your symptoms and your exam. Blood and urine tests will help confirm the diagnosis. The diagnostic evaluation should also identify the cause of dehydration. TREATMENT  Treatment of mild or moderate dehydration can often be done at home by increasing the amount of fluids that you drink. It is best to drink small amounts of fluid more often. Drinking too much at one time can make vomiting worse. Refer to the home care instructions below. Severe dehydration needs to be treated at the hospital where you will probably be given intravenous (IV) fluids that contain water and electrolytes. HOME CARE INSTRUCTIONS   Ask your caregiver about specific rehydration instructions.  Drink enough fluids to keep your urine clear or pale yellow.  Drink small amounts frequently if you have nausea and vomiting.  Eat as you normally do.  Avoid:  Foods or drinks high in sugar.  Carbonated  drinks.  Juice.  Extremely hot or cold fluids.  Drinks with caffeine.  Fatty, greasy foods.  Alcohol.  Tobacco.  Overeating.  Gelatin desserts.  Wash your hands well to avoid spreading bacteria and viruses.  Only take over-the-counter or prescription medicines for pain, discomfort, or fever as directed by your caregiver.  Ask your caregiver if you should continue all prescribed and over-the-counter medicines.  Keep all follow-up appointments with your caregiver. SEEK MEDICAL CARE IF:  You have abdominal pain and it increases or stays in one area (localizes).  You have a rash, stiff neck, or severe headache.  You are irritable, sleepy, or difficult to awaken.  You are weak, dizzy, or extremely thirsty. SEEK IMMEDIATE MEDICAL CARE IF:   You are unable to keep fluids down or you get worse despite treatment.  You have frequent episodes of vomiting or diarrhea.  You have blood or green matter (bile) in your vomit.  You have blood in your stool or your stool looks black and tarry.  You have not urinated in 6 to 8 hours, or you have only urinated a small amount of very dark urine.  You have a fever.  You faint. MAKE SURE YOU:   Understand these instructions.  Will watch your condition.  Will get help right away if you are not doing well or get worse. Document Released: 12/18/2004 Document Revised: 03/12/2011 Document Reviewed: 08/07/2010 ExitCare Patient Information 2014 [employer].  

## 2013-04-24 NOTE — Care Management Note (Signed)
    Page 1 of 1   04/24/2013     1:12:29 PM CARE MANAGEMENT NOTE 04/24/2013  Patient:  Tonya Sullivan, Tonya Sullivan   Account Number:  [id number]  Date Initiated:  04/24/2013  Documentation initiated by:  Claretha Cooper  Subjective/Objective Assessment:   Pt from home where she lives alone. Currently followed by [facility] community RN. Per PT, pt is not eligible for SNF placement. Pt is agreeable to [facility] RN and PT and chose [facility]     Action/Plan:   Anticipated DC Date:  04/24/2013   Anticipated DC Plan:  [place] referral  Clinical Social Worker      DC Planning Services  CM consult      Choice offered to / List presented to:          [facility] arranged  HH-1 RN  [place].   Status of service:  Completed, signed off Medicare Important Message given?  NA - LOS <3 / Initial given by admissions (If response is "NO", the following Medicare IM given date fields will be blank) Date Medicare IM given:   Date Additional Medicare IM given:    Discharge Disposition:  [place]  Per UR Regulation:    If discussed at Long Length of Stay Meetings, dates discussed:    Comments:  04/24/13 Claretha Cooper RN BSN CM [place] alerted to pending DC and referal. Corliss Blacker with [facility] also aware of Pending DC with [facility] RN and PT

## 2013-04-24 NOTE — Progress Notes (Signed)
Patient discharged with instructions given on medications,and follow up visits,patient verbalized understanding. Prescriptions sent with patient.Staff to accompany patient to awaiting vehicle.Vital signs stable.

## 2013-04-27 ENCOUNTER — Ambulatory Visit: Payer: Medicare Other | Admitting: Family Medicine

## 2013-04-27 LAB — CULTURE, BLOOD (ROUTINE X 2)
CULTURE: NO GROWTH
CULTURE: NO GROWTH

## 2013-04-28 ENCOUNTER — Telehealth: Payer: Self-pay | Admitting: *Deleted

## 2013-04-28 ENCOUNTER — Ambulatory Visit (HOSPITAL_COMMUNITY): Payer: Self-pay | Admitting: Psychiatry

## 2013-04-28 NOTE — Telephone Encounter (Signed)
Received call from [facility] SN with [facility].   Reported that patient was discharged from hospital over weekend with orders for [facility] SN, PT and MSW. Requested to add OT services.   Verbal order given.

## 2013-05-01 ENCOUNTER — Encounter (HOSPITAL_COMMUNITY): Payer: Self-pay | Admitting: Psychiatry

## 2013-05-01 ENCOUNTER — Other Ambulatory Visit (HOSPITAL_COMMUNITY): Payer: Self-pay | Admitting: Anesthesiology

## 2013-05-01 ENCOUNTER — Ambulatory Visit (INDEPENDENT_AMBULATORY_CARE_PROVIDER_SITE_OTHER): Payer: Medicare Other | Admitting: Psychiatry

## 2013-05-01 VITALS — BP 112/80 | Ht 66.0 in | Wt 189.0 lb

## 2013-05-01 DIAGNOSIS — F332 Major depressive disorder, recurrent severe without psychotic features: Secondary | ICD-10-CM

## 2013-05-01 DIAGNOSIS — M5137 Other intervertebral disc degeneration, lumbosacral region: Secondary | ICD-10-CM

## 2013-05-01 DIAGNOSIS — F411 Generalized anxiety disorder: Secondary | ICD-10-CM

## 2013-05-01 DIAGNOSIS — F319 Bipolar disorder, unspecified: Secondary | ICD-10-CM

## 2013-05-01 MED ORDER — ESCITALOPRAM OXALATE 20 MG PO TABS
20.0000 mg | ORAL_TABLET | ORAL | Status: DC
Start: 2013-05-01 — End: 2013-05-01

## 2013-05-01 MED ORDER — CLONAZEPAM 1 MG PO TABS
1.0000 mg | ORAL_TABLET | Freq: Four times a day (QID) | ORAL | Status: DC | PRN
Start: 2013-05-01 — End: 2013-05-22

## 2013-05-01 MED ORDER — DULOXETINE HCL 60 MG PO CPEP: 60.0000 mg | ORAL_CAPSULE | Freq: Every morning | ORAL | Status: DC

## 2013-05-01 MED ORDER — ESCITALOPRAM OXALATE 20 MG PO TABS
20.0000 mg | ORAL_TABLET | ORAL | Status: DC
Start: 2013-05-01 — End: 2013-05-22

## 2013-05-01 MED ORDER — AMITRIPTYLINE HCL 100 MG PO TABS
100.0000 mg | ORAL_TABLET | Freq: Every day | ORAL | Status: DC
Start: 2013-05-01 — End: 2013-05-22
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
## 2013-05-04 ENCOUNTER — Encounter (INDEPENDENT_AMBULATORY_CARE_PROVIDER_SITE_OTHER): Payer: Medicare Other | Admitting: General Surgery

## 2013-05-05 ENCOUNTER — Ambulatory Visit (INDEPENDENT_AMBULATORY_CARE_PROVIDER_SITE_OTHER): Payer: Medicare Other | Admitting: Family Medicine

## 2013-05-05 ENCOUNTER — Encounter: Payer: Self-pay | Admitting: Family Medicine

## 2013-05-05 VITALS — BP 112/84 | HR 88 | Temp 98.1°F | Resp 16 | Ht 66.5 in | Wt 190.0 lb

## 2013-05-05 DIAGNOSIS — F329 Major depressive disorder, single episode, unspecified: Secondary | ICD-10-CM

## 2013-05-05 DIAGNOSIS — I10 Essential (primary) hypertension: Secondary | ICD-10-CM

## 2013-05-05 DIAGNOSIS — E2749 Other adrenocortical insufficiency: Secondary | ICD-10-CM

## 2013-05-05 DIAGNOSIS — F32A Depression, unspecified: Secondary | ICD-10-CM

## 2013-05-05 DIAGNOSIS — I6529 Occlusion and stenosis of unspecified carotid artery: Secondary | ICD-10-CM

## 2013-05-05 DIAGNOSIS — G8929 Other chronic pain: Secondary | ICD-10-CM

## 2013-05-05 DIAGNOSIS — E2740 Unspecified adrenocortical insufficiency: Secondary | ICD-10-CM

## 2013-05-05 DIAGNOSIS — F3289 Other specified depressive episodes: Secondary | ICD-10-CM

## 2013-05-05 NOTE — Assessment & Plan Note (Signed)
Reviewed her psychiatry no continue current medications she does not need to benzodiazepine she'll continue her Klonopin as they prescribed

## 2013-05-05 NOTE — Assessment & Plan Note (Signed)
This is the most alert and refreshed I have seen Tonya Sullivan. I agree that she would benefit from an assisted living facility however she declines. I agree that tramadol should be the strongest and I did not provide her with any other prescriptions as she is in a pain clinic.

## 2013-05-05 NOTE — Progress Notes (Signed)
Patient ID: Tonya Sullivan, female   DOB: May 03, 1955, 58 y.o.   MRN: [id number]   Subjective:    Patient ID: Tonya Sullivan, female    DOB: 1955/11/04, 58 y.o.   MRN: [id number]  Patient presents for hospital F/U  patient here to follow possible [place] secondary to hypotension and some mild altered renal status. She had multiple medications at home which her [place] revealed to me. At our last visit she was only supposed to be on metoprolol and hydrochlorothiazide. The hospital and notes that she had propanolol next day which is an old medication she was on in the past. She was also with a bottle of Xanax as well as Klonopin even though I discontinued the Xanax at the last visit. She hasn't taken her prednisone as prescribed by her endocrinologist for her adrenal insufficiency. Of note she was asked multiple times to consider assisted living facility versus skilled nursing and she declined both. She is actually back at home and has her home health nurse which comes out for 4-5 hours a day including the weekends. He did ask about pain medication because the pain clinic now has her on tramadol she states that the nurse called her pain clinic physician and advised him not to give her narcotics. She was also seen by her psychiatrist recently but there were no medication changes.    Review Of Systems:  GEN- denies fatigue, fever, weight loss,weakness, recent illness HEENT- denies eye drainage, change in vision, nasal discharge, CVS- denies chest pain, palpitations RESP- denies SOB, cough, wheeze ABD- denies N/V, change in stools, abd pain GU- denies dysuria, hematuria, dribbling, incontinence MSK- + joint pain, muscle aches, injury Neuro- denies headache, dizziness, syncope, seizure activity       Objective:    BP 112/84  Pulse 88  Temp(Src) 98.1 F (36.7 C) (Oral)  Resp 16  Ht 5' 6.5" (1.689 m)  Wt 190 lb (86.183 kg)  BMI 30.21 kg/m2 GEN- NAD, alert and oriented x3 HEENT- PERRL, EOMI,  non injected sclera, pink conjunctiva, MMM, oropharynx clear,  Neck- Supple, no LAD CVS- RRR, no murmur RESP-CTAB PSych- Depressed affect,not anxious, normal speech, very alert, normal though process, well groomed EXT- No edema Pulses- Radial 2+       Assessment & Plan:      Problem List Items Addressed This Visit   None      Note: This dictation was prepared with Dragon dictation along with smaller phrase technology. Any transcriptional errors that result from this process are unintentional.

## 2013-05-05 NOTE — Assessment & Plan Note (Signed)
Continue with prednisone at current day she is followup in July with endocrinology

## 2013-05-05 NOTE — Assessment & Plan Note (Signed)
For now she will continue the metoprolol I will hold on the hydrochlorothiazide and the ARB

## 2013-05-05 NOTE — Patient Instructions (Signed)
F/U 2 months Continue current medications [facility] to continue to follow

## 2013-05-06 ENCOUNTER — Ambulatory Visit (HOSPITAL_COMMUNITY)
Admission: RE | Admit: 2013-05-06 | Discharge: 2013-05-06 | Disposition: A | Discharge status: Home / Self Care | Payer: Medicare Other | Source: Ambulatory Visit | Attending: Anesthesiology | Admitting: Anesthesiology

## 2013-05-06 DIAGNOSIS — M5126 Other intervertebral disc displacement, lumbar region: Secondary | ICD-10-CM | POA: Insufficient documentation

## 2013-05-06 DIAGNOSIS — Z981 Arthrodesis status: Secondary | ICD-10-CM | POA: Insufficient documentation

## 2013-05-06 DIAGNOSIS — M5137 Other intervertebral disc degeneration, lumbosacral region: Secondary | ICD-10-CM

## 2013-05-06 MED ORDER — GADOBENATE DIMEGLUMINE 529 MG/ML IV SOLN
18.0000 mL | Freq: Once | INTRAVENOUS | Status: AC | PRN
  Administered 2013-05-06: 18 mL via INTRAVENOUS

## 2013-05-11 ENCOUNTER — Ambulatory Visit (INDEPENDENT_AMBULATORY_CARE_PROVIDER_SITE_OTHER): Payer: Medicare Other | Admitting: Psychiatry

## 2013-05-11 DIAGNOSIS — F319 Bipolar disorder, unspecified: Secondary | ICD-10-CM

## 2013-05-11 NOTE — Patient Instructions (Signed)
Discussed orally 

## 2013-05-11 NOTE — Progress Notes (Signed)
   THERAPIST PROGRESS NOTE  Session Time: Monday 05/11/2013 3:10 PM - 3:45 PM  Participation Level: Active  Behavioral Response: CasualAlertEuthymic  Type of Therapy: Individual Therapy  Treatment Goals addressed: Improve ability to manage stress and anxiety using coping skills and relaxation techniques, Improve assertiveness skills  Interventions: CBT and Supportive  Summary: Tonya Sullivan is a 58 y.o. female who presents with a long-standing history of depression beginning in adolescence. Patient also has significant trauma history. Patient also experienced grief and loss issues due to to the death of her husband in 2010-09-20. She continues to experience stress related to ongoing health issues as well as family problems. She reports having a reaction to a pain medication from the pain clinic and having 3 hospitalizations since last session. She states son and his girlfriend told people that she was trying to kill herself but patient denies this. Her son's girlfriend will not allow patient to see or talk to her grandchildren. She expresses sadness about this but does not appear overwhelmed.She expresses disappointment and frustration with her son and his girlfriend. Patient reports adjusting well to being home and reports having support from her mother and stepfather, 2 close friends, her CNA, and her nurse case Nurse, children's.   Suicidal/Homicidal: No  Therapist Response: Therapist works with patient to identify and verbalize feelings, reinforce efforts to set and maintain boundaries and improve assertiveness skills, reinforce patient's use of her support system, reframe negative thoughts  Plan: Return again in 4 weeks.  Diagnosis: Axis I: Bipolar Disorder    Axis II: Deferred    Tonya Asebedo, LCSW 05/11/2013

## 2013-05-13 ENCOUNTER — Encounter: Payer: Self-pay | Admitting: Adult Health

## 2013-05-13 ENCOUNTER — Ambulatory Visit (INDEPENDENT_AMBULATORY_CARE_PROVIDER_SITE_OTHER): Payer: Medicare Other | Admitting: Adult Health

## 2013-05-13 VITALS — BP 106/76 | HR 82 | Ht 67.0 in | Wt 191.0 lb

## 2013-05-13 DIAGNOSIS — I10 Essential (primary) hypertension: Secondary | ICD-10-CM

## 2013-05-13 DIAGNOSIS — I6529 Occlusion and stenosis of unspecified carotid artery: Secondary | ICD-10-CM

## 2013-05-13 DIAGNOSIS — Z720 Tobacco use: Secondary | ICD-10-CM

## 2013-05-13 DIAGNOSIS — F17200 Nicotine dependence, unspecified, uncomplicated: Secondary | ICD-10-CM

## 2013-05-13 NOTE — Patient Instructions (Addendum)
Your physician recommends that you schedule a follow-up appointment in: 6 months with Dr Virgina Jock will receive a reminder letter two months in advance reminding you to call and schedule your appointment. If you don't receive this letter, please contact our office.  Your physician recommends that you continue on your current medications as directed. Please refer to the Current Medication list given to you today.

## 2013-05-13 NOTE — Assessment & Plan Note (Signed)
Unfortunately, she has restarted smoking and is smoking heavily. A minimum of 1 pack a day but sometimes up to 2 packs a day related to her psychological disorder. She has been advised to stop smoking as she did so for a short period of time recently. She verbalizes understanding but I doubt that she will stop anytime soon.

## 2013-05-13 NOTE — Progress Notes (Deleted)
Name: Tonya Sullivan    DOB: 06/15/1955  Age: 58 y.o.  MR#: [id number]       PCP:  Vic Blackbird, MD      Insurance: Payor: MEDICARE / Plan: MEDICARE PART A AND B / Product Type: *No Product type* /   CC:    Chief Complaint  Patient presents with  . Hypotension    VS Filed Vitals:   05/13/13 1446  BP: 106/76  Pulse: 82  Height: 5\' 7"  (1.702 m)  Weight: 191 lb (86.637 kg)    Weights Current Weight  05/13/13 191 lb (86.637 kg)  05/05/13 190 lb (86.183 kg)  05/01/13 189 lb (85.73 kg)    Blood Pressure  BP Readings from Last 3 Encounters:  05/13/13 106/76  05/05/13 112/84  05/01/13 112/80     Admit date:  (Not on file) Last encounter with RMR:  Visit date not found   Allergy Codeine; Cyclobenzaprine; Darvocet; Abilify; Ace inhibitors; Adhesive; Gabapentin; Iodine; Levofloxacin; Prednisone; Sulfa antibiotics; and Robaxin  Current Outpatient Prescriptions  Medication Sig Dispense Refill  . albuterol (PROVENTIL HFA;VENTOLIN HFA) 108 (90 BASE) MCG/ACT inhaler Inhale 2 puffs into the lungs 3 (three) times daily as needed for wheezing or shortness of breath.      Marland Kitchen albuterol (PROVENTIL) (2.5 MG/3ML) 0.083% nebulizer solution Take 2.5 mg by nebulization every 4 (four) hours as needed for wheezing.      Marland Kitchen amitriptyline (ELAVIL) 100 MG tablet Take 1 tablet (100 mg total) by mouth at bedtime.  30 tablet  2  . baclofen (LIORESAL) 10 MG tablet Take 5 mg by mouth 2 (two) times daily.       . clonazePAM (KLONOPIN) 1 MG tablet Take 1 tablet (1 mg total) by mouth 4 (four) times daily as needed for anxiety.  120 tablet  2  . DULoxetine (CYMBALTA) 60 MG capsule Take 1 capsule (60 mg total) by mouth every morning.  30 capsule  2  . escitalopram (LEXAPRO) 20 MG tablet Take 1 tablet (20 mg total) by mouth every morning.  30 tablet  2  . fluocinonide gel (LIDEX) 6.16 % Apply 1 application topically 2 (two) times daily.      Marland Kitchen lidocaine (LIDODERM) 5 % Place 1 patch onto the skin every 12 (twelve)  hours.      . lidocaine (XYLOCAINE) 2 % solution Use as directed 20 mLs in the mouth or throat every 4 (four) hours as needed for mouth pain.       . metoprolol tartrate (LOPRESSOR) 25 MG tablet Take 1 tablet (25 mg total) by mouth 2 (two) times daily with a meal.  60 tablet  0  . oxybutynin (DITROPAN XL) 10 MG 24 hr tablet Take 1 tablet (10 mg total) by mouth daily.  30 tablet  2  . pantoprazole (PROTONIX) 40 MG tablet Take 1 tablet (40 mg total) by mouth daily.  30 tablet  1  . pravastatin (PRAVACHOL) 40 MG tablet Take 1 tablet (40 mg total) by mouth every evening.  30 tablet  2  . predniSONE (DELTASONE) 2.5 MG tablet Take 3 tablets (7.5 mg total) by mouth daily with breakfast.      . tiotropium (SPIRIVA) 18 MCG inhalation capsule Place 1 capsule (18 mcg total) into inhaler and inhale daily. USES IN THE EVENING  30 capsule  2  . traMADol (ULTRAM) 50 MG tablet Take by mouth every 8 (eight) hours as needed.      . [DISCONTINUED] Gabapentin, PHN, (GRALISE STARTER)  300 & 600 MG MISC Take 300-1,800 mg by mouth as directed. 15 DAY Starter pack doses     DAY 1- 300 mg     DAY 2- 600 mg     DAY 3 to 6 - 900 mg    DAY 7 to 10 - 1200 mg     DAY 11 to 14 - 1500 mg      DAY 15 - 1800 mg        No current facility-administered medications for this visit.    Discontinued Meds:   There are no discontinued medications.  Patient Active Problem List   Diagnosis Date Noted  . Chalasia of lower esophageal sphincter 03/18/2013  . 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 Cervical spondylosis without myelopathy 10/31/2012  . Gait disturbance 10/31/2012  . Urinary incontinence 10/15/2012  . Peripheral edema 08/10/2012  . Dysphagia 08/05/2012  . TIA (transient ischemic attack) 08/03/2012  . Dysarthria 08/03/2012  . Stricture and stenosis of esophagus 08/03/2012  . Spontaneous ecchymoses 07/20/2012  . Fall 05/22/2012  . Hoarse voice quality 05/22/2012  . Essential hypertension 05/01/2012  . Mole of  skin 05/01/2012  . Carotid stenosis 01/01/2012  . COPD (chronic obstructive pulmonary disease) 02/19/2011  . Tobacco use 02/19/2011  . Chronic back pain 02/19/2011  . Bipolar disorder 02/19/2011  . Hyperlipidemia 09/26/2010  . Depression 09/26/2010  . Chronic pain 09/26/2010    LABS    Component Value Date/Time   NA 143 04/24/2013 0505   NA 139 04/23/2013 0428   NA 138 04/22/2013 1511   K 3.9 04/24/2013 0505   K 3.8 04/23/2013 0428   K 3.6* 04/22/2013 1511   CL 105 04/24/2013 0505   CL 97 04/23/2013 0428   CL 93* 04/22/2013 1511   CO2 29 04/24/2013 0505   CO2 30 04/23/2013 0428   CO2 32 04/22/2013 1511   GLUCOSE 100* 04/24/2013 0505   GLUCOSE 128* 04/23/2013 0428   GLUCOSE 111* 04/22/2013 1511   BUN 12 04/24/2013 0505   BUN 16 04/23/2013 0428   BUN 17 04/22/2013 1511   CREATININE 1.08 04/24/2013 0505   CREATININE 1.27* 04/23/2013 0428   CREATININE 1.77* 04/22/2013 1511   CREATININE 0.92 04/17/2013 1525   CREATININE 1.42* 08/11/2012 1500   CREATININE 1.19* 08/08/2012 1302   CALCIUM 8.6 04/24/2013 0505   CALCIUM 8.9 04/23/2013 0428   CALCIUM 9.5 04/22/2013 1511   GFRNONAA 56* 04/24/2013 0505   GFRNONAA 46* 04/23/2013 0428   GFRNONAA 31* 04/22/2013 1511   GFRAA 65* 04/24/2013 0505   GFRAA 53* 04/23/2013 0428   GFRAA 36* 04/22/2013 1511   CMP     Component Value Date/Time   NA 143 04/24/2013 0505   K 3.9 04/24/2013 0505   CL 105 04/24/2013 0505   CO2 29 04/24/2013 0505   GLUCOSE 100* 04/24/2013 0505   BUN 12 04/24/2013 0505   CREATININE 1.08 04/24/2013 0505   CREATININE 0.92 04/17/2013 1525   CALCIUM 8.6 04/24/2013 0505   PROT 7.3 04/22/2013 1511   ALBUMIN 3.4* 04/22/2013 1511   AST 51* 04/22/2013 1511   ALT 30 04/22/2013 1511   ALKPHOS 74 04/22/2013 1511   BILITOT 0.4 04/22/2013 1511   GFRNONAA 56* 04/24/2013 0505   GFRAA 65* 04/24/2013 0505       Component Value Date/Time   WBC 6.5 04/23/2013 0428   WBC 9.0 04/22/2013 1511   WBC 7.1 04/17/2013 1525   HGB 13.2 04/23/2013 0428   HGB 14.6 04/22/2013  1511  HGB 13.2 04/17/2013 1525   HCT 40.6 04/23/2013 0428   HCT 43.9 04/22/2013 1511   HCT 39.2 04/17/2013 1525   MCV 95.1 04/23/2013 0428   MCV 94.6 04/22/2013 1511   MCV 92.2 04/17/2013 1525    Lipid Panel     Component Value Date/Time   CHOL 134 05/01/2012 1155   TRIG 79 05/01/2012 1155   HDL 58 05/01/2012 1155   CHOLHDL 2.3 05/01/2012 1155   VLDL 16 05/01/2012 1155   LDLCALC 60 05/01/2012 1155    ABG    Component Value Date/Time   PHART 7.415 04/09/2013 0053   PCO2ART 50.0* 04/09/2013 0053   PO2ART 71.3* 04/09/2013 0053   HCO3 31.4* 04/09/2013 0053   TCO2 28.2 04/09/2013 0053   O2SAT 93.5 04/09/2013 0053     Lab Results  Component Value Date   TSH 1.722 03/27/2013   BNP (last 3 results)  Recent Labs  08/03/12 0850 10/25/12 0930  PROBNP 745.1* 209.0*   Cardiac Panel (last 3 results) No results found for this basename: CKTOTAL, CKMB, TROPONINI, RELINDX,  in the last 72 hours  Iron/TIBC/Ferritin No results found for this basename: iron, tibc, ferritin     EKG Orders placed during the hospital encounter of 04/22/13  . EKG 12-LEAD  . EKG 12-LEAD  . ED EKG  . ED EKG  . EKG     Prior Assessment and Plan Problem List as of 05/13/2013     Cardiovascular and Mediastinum   Carotid stenosis   Last Assessment & Plan   05/13/2012 Office Visit Written 05/13/2012  4:26 PM by Renella Cunas, MD     Patient advised to quit smoking. Continue secondary preventative therapy with statin and aspirin.    Essential hypertension   Last Assessment & Plan   05/05/2013 Office Visit Written 05/05/2013  1:59 PM by Alycia Rossetti, MD     For now she will continue the metoprolol I will hold on the hydrochlorothiazide and the ARB    TIA (transient ischemic attack)     Respiratory   COPD (chronic obstructive pulmonary disease)   Last Assessment & Plan   03/11/2013 Office Visit Written 03/11/2013  5:19 PM by Alycia Rossetti, MD     Currently stable on current medications. No recent exacerbation. I think she  can be cleared to proceed with surgery      Digestive   Stricture and stenosis of esophagus   Dysphagia   Last Assessment & Plan   08/08/2012 Office Visit Written 08/10/2012  7:41 PM by Alycia Rossetti, MD     Seen by GI, plan for EGD    Chalasia of lower esophageal sphincter     Endocrine   Adrenal cortical insufficiency   Last Assessment & Plan   05/05/2013 Office Visit Written 05/05/2013  2:00 PM by Alycia Rossetti, MD     Continue with prednisone at current day she is followup in July with endocrinology      Musculoskeletal and Integument   Mole of skin   Last Assessment & Plan   05/01/2012 Office Visit Written 05/01/2012 11:02 PM by Alycia Rossetti, MD     Benign appearing, she has f/u derm    Cervical spondylosis without myelopathy   Last Assessment & Plan   01/26/2013 Office Visit Written 01/27/2013 10:40 PM by Alycia Rossetti, MD     S/p decompression in Oct 2014 Multiple surgeries Chronic narcotic medication      Other   Hyperlipidemia  Last Assessment & Plan   05/13/2012 Office Visit Written 05/13/2012  4:26 PM by Renella Cunas, MD     Lipids are at goal including a high HDL. No change in pravastatin.    Depression   Last Assessment & Plan   05/05/2013 Office Visit Written 05/05/2013  1:58 PM by Alycia Rossetti, MD     Reviewed her psychiatry no continue current medications she does not need to benzodiazepine she'll continue her Klonopin as they prescribed    Chronic pain   Last Assessment & Plan   05/05/2013 Office Visit Written 05/05/2013  1:59 PM by Alycia Rossetti, MD     This is the most alert and refreshed I have seen Ms. Delmonaco. I agree that she would benefit from an assisted living facility however she declines. I agree that tramadol should be the strongest and I did not provide her with any other prescriptions as she is in a pain clinic.    Tobacco use   Last Assessment & Plan   03/11/2013 Office Visit Written 03/11/2013  5:18 PM by Alycia Rossetti, MD     She has  cut back on tobacco down to  1/2ppd    Chronic back pain   Last Assessment & Plan   04/17/2013 Office Visit Written 04/18/2013  7:39 AM by Alycia Rossetti, MD     She has chronic back pain and has been on high-dose narcotics for some time now. She was taken off of her narcotics when she went to the nursing home and she was last given Suboxone. Suboxone may not be the best thing for her. She's not displaying any withdrawal symptoms at this time. She has an appointment with pain management in [place] on April 22. Will give her 12 tablets of Percocet one twice a day at the nursing home can dispense over the weekend as needed for pain.    Bipolar disorder   Last Assessment & Plan   04/17/2013 Office Visit Written 04/18/2013  7:38 AM by Alycia Rossetti, MD     I reviewed her medications. I'm not sure why she is on 4 times a day Klonopin as well as Xanax. I will discontinue the Xanax and keep her Klonopin which she has been on by her psychiatrist. She will continue her other medications and her appointment will be rescheduled.    Fall   Last Assessment & Plan   05/22/2012 Office Visit Edited 05/22/2012  3:15 PM by Alycia Rossetti, MD     Status post fall no focal deficits but she does have some bruising I will have her stop her aspirin for right now. She has pain medications as well as muscle relaxants She has complaint of fatigue and weakness as well per above. We will consider having her neurologist who is her pain doctor evaluate her for cannot find any cause of all of the symptoms    Hoarse voice quality   Last Assessment & Plan   05/22/2012 Office Visit Written 05/22/2012  3:15 PM by Alycia Rossetti, MD     She's had a hoarse voice for the past couple of months in lieu of these other things going on she does have COPD. I will have to get her evaluated by ear nose and throat after she is evaluated for thyroid etiology by endocrinology    Spontaneous ecchymoses   Last Assessment & Plan    10/07/2012 Office Visit Written 10/07/2012  8:39 PM by Alycia Rossetti,  MD     Recheck CBC, PT/INR, PTT before surgery, I think this is due to small capillaries spontaneous rupture No liver failure    Dysarthria   Peripheral edema   Last Assessment & Plan   10/31/2012 Office Visit Written 10/31/2012  2:53 PM by Alycia Rossetti, MD     Elevate feet, continue diuretic,    Urinary incontinence   Last Assessment & Plan   10/15/2012 Office Visit Written 10/15/2012  5:25 PM by Alycia Rossetti, MD     Trial of ditropan    Gait disturbance       Imaging: Dg Chest 2 View  04/22/2013   CLINICAL DATA:  Status post fall.  Hypotension.  EXAM: CHEST  2 VIEW  COMPARISON:  February 09, 2013  FINDINGS: The heart size and mediastinal contours are within normal limits. There is no focal infiltrate, pulmonary edema, or pleural effusion. The visualized skeletal structures are stable.  IMPRESSION: No active cardiopulmonary disease.   Electronically Signed   By: Abelardo Diesel M.D.   On: 04/22/2013 16:08   Mr Lumbar Spine W Wo Contrast  05/06/2013   CLINICAL DATA:  Back and leg pain. History of prior lumbar surgeries.  EXAM: MRI LUMBAR SPINE WITHOUT AND WITH CONTRAST  TECHNIQUE: Multiplanar and multiecho pulse sequences of the lumbar spine were obtained without and with intravenous contrast.  CONTRAST:  4mL MULTIHANCE GADOBENATE DIMEGLUMINE 529 MG/ML IV SOLN  COMPARISON:  10/07/2012.  FINDINGS: Normal alignment of the lumbar vertebral bodies. They demonstrate normal marrow signal. Endplate reactive changes are noted at L3-4, L4-5 and L5-S1 due to the interbody rate cages. There are pedicle screws and posterior rods fusing L3-4. The conus medullaris terminates at the bottom of L1. The facets are normally aligned.  No significant paraspinal or retroperitoneal findings. Small renal cysts are noted.  L1-2: Moderate-sized disc extrusion with disc material coursing up behind the L1 vertebral body. There is mass effect on  the left side of the thecal sac. This is a new finding since the prior MRI.  L2-3: Moderate-sized central disc protrusion with mass effect on the ventral thecal sac and bilateral lateral recess stenosis. This is a slightly progressive finding when compared to the prior study.  L3-4: Posterior and interbody fusion changes. Wide decompressive laminectomy. No spinal or foraminal stenosis.  L4-5: Interbody Ray cage fusion with wide decompressive laminectomy. No spinal or foraminal stenosis.  L5-S1: Interbody Ray cage fusion. No spinal or foraminal stenosis. Prior posterior fusion hardware has been removed.  IMPRESSION: 1. Moderate-sized left paracentral disc extrusion at L1-2. This is new since the prior MRI. 2. Moderate-sized central disc protrusion at L2-3, slightly progressive since prior study. 3. Stable fusion changes at L3-4, L4-5 and L5-S1. No spinal or foraminal stenosis at these levels.   Electronically Signed   By: Kalman Jewels M.D.   On: 05/06/2013 13:44

## 2013-05-13 NOTE — Assessment & Plan Note (Signed)
She is slightly orthostatic on no antihypertensives with the exception of metoprolol. I will leave her on this. She is advised to take her time with position changes and avoid narcotic over use at home. Will see her again in 6 months.

## 2013-05-13 NOTE — Progress Notes (Signed)
HPI: Tonya Sullivan is a 58 year old medically complicated patient we are seeing posthospitalization after admission for altered mental status, hypotension, with history of renal insufficiency on low-dose prednisone, bipolar disorder, chronic back pain, history of hypertension, GERD and COPD.    During hospitalization the patient had an echocardiogram completed on 04/02/2013 demonstrating normal LV systolic function of 1610% with no regional wall motion abnormalities, normal diastolic function. No valvular abnormalities were noted.    On discharge from the hospital the patient's antihypertensive medications were held which included losartan, HCTZ, and propanolol. Metoprolol was continued. She is here for post hospital follow-up.s   She comes today complaining of continued dizziness especially with position change. She has started smoking again. No chest pain or DOE.      Allergies  Allergen Reactions  . Codeine Itching  . Cyclobenzaprine Hives  . Darvocet [Propoxyphene N-Acetaminophen] Rash  . Abilify [Aripiprazole] Other (See Comments)    tremors  . Ace Inhibitors   . Adhesive [Tape] Hives  . Gabapentin Swelling  . Iodine Hives  . Levofloxacin Hives and Hypertension  . Prednisone Hives, Itching and Hypertension  . Sulfa Antibiotics Itching  . Robaxin [Methocarbamol] Rash    Current Outpatient Prescriptions  Medication Sig Dispense Refill  . albuterol (PROVENTIL HFA;VENTOLIN HFA) 108 (90 BASE) MCG/ACT inhaler Inhale 2 puffs into the lungs 3 (three) times daily as needed for wheezing or shortness of breath.      Marland Kitchen albuterol (PROVENTIL) (2.5 MG/3ML) 0.083% nebulizer solution Take 2.5 mg by nebulization every 4 (four) hours as needed for wheezing.      Marland Kitchen amitriptyline (ELAVIL) 100 MG tablet Take 1 tablet (100 mg total) by mouth at bedtime.  30 tablet  2  . baclofen (LIORESAL) 10 MG tablet Take 5 mg by mouth 2 (two) times daily.       . clonazePAM (KLONOPIN) 1 MG tablet Take 1 tablet (1 mg  total) by mouth 4 (four) times daily as needed for anxiety.  120 tablet  2  . DULoxetine (CYMBALTA) 60 MG capsule Take 1 capsule (60 mg total) by mouth every morning.  30 capsule  2  . escitalopram (LEXAPRO) 20 MG tablet Take 1 tablet (20 mg total) by mouth every morning.  30 tablet  2  . fluocinonide gel (LIDEX) 9.60 % Apply 1 application topically 2 (two) times daily.      Marland Kitchen lidocaine (LIDODERM) 5 % Place 1 patch onto the skin every 12 (twelve) hours.      . lidocaine (XYLOCAINE) 2 % solution Use as directed 20 mLs in the mouth or throat every 4 (four) hours as needed for mouth pain.       . metoprolol tartrate (LOPRESSOR) 25 MG tablet Take 1 tablet (25 mg total) by mouth 2 (two) times daily with a meal.  60 tablet  0  . oxybutynin (DITROPAN XL) 10 MG 24 hr tablet Take 1 tablet (10 mg total) by mouth daily.  30 tablet  2  . pantoprazole (PROTONIX) 40 MG tablet Take 1 tablet (40 mg total) by mouth daily.  30 tablet  1  . pravastatin (PRAVACHOL) 40 MG tablet Take 1 tablet (40 mg total) by mouth every evening.  30 tablet  2  . predniSONE (DELTASONE) 2.5 MG tablet Take 3 tablets (7.5 mg total) by mouth daily with breakfast.      . tiotropium (SPIRIVA) 18 MCG inhalation capsule Place 1 capsule (18 mcg total) into inhaler and inhale daily. USES IN THE EVENING  30 capsule  2  . traMADol (ULTRAM) 50 MG tablet Take by mouth every 8 (eight) hours as needed.      . [DISCONTINUED] Gabapentin, PHN, (GRALISE STARTER) 300 & 600 MG MISC Take 300-1,800 mg by mouth as directed. 15 DAY Starter pack doses     DAY 1- 300 mg     DAY 2- 600 mg     DAY 3 to 6 - 900 mg    DAY 7 to 10 - 1200 mg     DAY 11 to 14 - 1500 mg      DAY 15 - 1800 mg        No current facility-administered medications for this visit.    Past Medical History  Diagnosis Date  . COPD (chronic obstructive pulmonary disease)   . Depression   . Chronic back pain     DDD, disc bulge, radiculopathy, spinal stenosis  . Hyperlipidemia   . Anxiety    . Bipolar disorder, unspecified   . Carotid artery calcification   . DDD (degenerative disc disease), lumbosacral   . Spinal stenosis, lumbar   . Thyroid disease   . Autoimmune thyroiditis   . Hypertension   . TIA (transient ischemic attack)     august 2014  . GERD (gastroesophageal reflux disease)   . H/O dizziness   . Neuropathic pain   . Adrenal gland dysfunction     Addison's disease ON DAILY PREDNISONE  . Addison's disease   . Stroke     HX OF MINI STROKE AUG 2014- DROPPIN EVERYTHING, SPEECH SLURRED, MEMORY AFFECTED --ALL RESOLVED - NO DEFICITS NOW  . Pneumonia     LAST PNEUMONIA WAS AUG 2014  . Achalasia   . Mouth ulcers   . Dysrhythmia     palpitations  . Melanoma     removed 1 week ago at Dr Teryl Lucy office    Past Surgical History  Procedure Laterality Date  . Appendectomy    . Cholecystectomy    . Lump left breast      removed- benign  . Abdominal hysterectomy      tubal pregnancy  . Inguinal hernia repair    . Stomach surgery      ?holes in esophgous  . Ectopic pregnancy surgery    . Back surgery      x 5;1984;1989;;1999;2000;2010  . Colonoscopy  01/25/2012    Procedure: COLONOSCOPY;  Surgeon: Rogene Houston, MD;  Location: [facility];  Service: Endoscopy;  Laterality: N/A;  130  . Esophagogastroduodenoscopy (egd) with esophageal dilation N/A 08/13/2012    Procedure: ESOPHAGOGASTRODUODENOSCOPY (EGD) WITH ESOPHAGEAL DILATION;  Surgeon: Rogene Houston, MD;  Location: [facility];  Service: Endoscopy;  Laterality: N/A;  315  . Breast surgery      left breast  . Anterior cervical decomp/discectomy fusion N/A 10/21/2012    Procedure: Cervical Six-Seven Anterior cervical decompression/diskectomy/fusion;  Surgeon: Kristeen Miss, MD;  Location: [facility];  Service: Neurosurgery;  Laterality: N/A;  Cervical Six-Seven Anterior cervical decompression/diskectomy/fusion  . Cataract extraction w/phaco Right 11/25/2012    Procedure: CATARACT EXTRACTION PHACO AND  INTRAOCULAR LENS PLACEMENT (IOC);  Surgeon: Elta Guadeloupe T. Gershon Crane, MD;  Location: [facility];  Service: Ophthalmology;  Laterality: Right;  CDE:10.06  . Cataract extraction w/phaco Left 12/09/2012    Procedure: CATARACT EXTRACTION PHACO AND INTRAOCULAR LENS PLACEMENT (IOC);  Surgeon: Elta Guadeloupe T. Gershon Crane, MD;  Location: [facility];  Service: Ophthalmology;  Laterality: Left;  CDE:5.06  . Tonsillectomy    .  Hiatal hernia repair    . Heller myotomy N/A 03/17/2013    Procedure: DIAGNOSTIC LAPAROSCOPY, LAPAROSCOPIC HELLER MYOTOMY, ENDOSCOPY, DOR FUNDOPLICATION;  Surgeon: Ralene Ok, MD;  Location: [facility];  Service: General;  Laterality: N/A;    ROS: Review of systems complete and found to be negative unless listed above  PHYSICAL EXAM BP 106/76  Pulse 82  Ht 5\' 7"  (1.702 m)  Wt 191 lb (86.637 kg)  BMI 29.91 kg/m2 General: Well developed, well nourished, in no acute distress, smelling strongly of cigarettes.  Head: Eyes PERRLA, No xanthomas.   Normal cephalic and atramatic  Lungs: Clear bilaterally to auscultation =some upper airway wheezes.  Heart: HRRR S1 S2, without MRG.  Pulses are 2+ & equal.            No carotid bruit. No JVD.  No abdominal bruits. No femoral bruits. Abdomen: Bowel sounds are positive, abdomen soft and non-tender without masses or                  Hernia's noted. Msk:  Back normal, normal gait, wearing back brace, uses walker for ambulation.. Normal strength and tone for age. Extremities: No clubbing, cyanosis or edema.  DP +1 Neuro: Alert and oriented X 3. Psych:  Flat affect, responds appropriately     ASSESSMENT AND PLAN

## 2013-05-18 ENCOUNTER — Telehealth: Payer: Self-pay | Admitting: *Deleted

## 2013-05-18 NOTE — Telephone Encounter (Signed)
Message copied by Sheral Flow on Mon May 18, 2013  9:49 AM ------      Message from: Tonya Sullivan      Created: Mon May 18, 2013  9:41 AM       Patient is calling to speak with you regarding medications was not specific about which medications       [phone] ------

## 2013-05-18 NOTE — Telephone Encounter (Signed)
Call returned to patient.   States that she is having memory problems. States that she is having memory loss and increased confusion.   Also states that she is taking Metoprolol 25mg  (1/2) tab BID. States that she was instructed by nurse who came to home to only take (1/2) tab.   Advised that if her BP is normal, she should continue medications as ordered and medication was ordered (1) tab BID.   Verbally confirmed all medications on list.

## 2013-05-19 ENCOUNTER — Telehealth: Payer: Self-pay | Admitting: *Deleted

## 2013-05-19 ENCOUNTER — Telehealth (HOSPITAL_COMMUNITY): Payer: Self-pay | Admitting: Psychiatry

## 2013-05-19 NOTE — Telephone Encounter (Signed)
Received call from patient.   Reports that confusion continues and she cannot remember anything.   Requested advice on HTN medications.   Advised that MD ordered to stop all HTN medications except Metoprolol 25mg  (1) tab PO BID. Reports that nurse who comes to her home states she is supposed to be taking (1/2) tab.   States that she has been taking either (1/2) tab to (1) tab.  States her BP is normal. Advised that if BP remains WNL on (1/2) tab, continue dosage. If BP becomes elevated on (1/2) tab, increase to (1) tab BID per MD orders.   MD to be made aware.

## 2013-05-19 NOTE — Telephone Encounter (Signed)
Called back, left message  

## 2013-05-19 NOTE — Telephone Encounter (Signed)
Agree give 1/2 metoprolol 25mg  BID

## 2013-05-20 ENCOUNTER — Telehealth: Payer: Self-pay | Admitting: *Deleted

## 2013-05-20 ENCOUNTER — Encounter (INDEPENDENT_AMBULATORY_CARE_PROVIDER_SITE_OTHER): Payer: Self-pay | Admitting: General Surgery

## 2013-05-20 ENCOUNTER — Ambulatory Visit (INDEPENDENT_AMBULATORY_CARE_PROVIDER_SITE_OTHER): Payer: Medicare Other | Admitting: General Surgery

## 2013-05-20 VITALS — BP 126/76 | HR 86 | Temp 98.3°F | Ht 67.0 in | Wt 195.0 lb

## 2013-05-20 DIAGNOSIS — Z9889 Other specified postprocedural states: Secondary | ICD-10-CM

## 2013-05-20 NOTE — Telephone Encounter (Signed)
Left message on voice mail. Patient had requested another pain referral. I left a message letting her that  I already referred her to a different pain clinic and that she is unhappy she can try to call Dr. Alessandra Grout office back again to see if they will accept her again . I do not typically refer to multiple pain clinics. I'm not going to prescribe her pain medication I have already discussed that with her and meds need to come from  pain Dr. whatever they think would be reasonable for her.

## 2013-05-20 NOTE — Telephone Encounter (Signed)
Message copied by Sheral Flow on Wed May 20, 2013 10:35 AM ------      Message from: Lenore Manner      Created: Wed May 20, 2013 10:21 AM      Regarding: Pain Med      Contact: [phone]       Pt is wanting to speak to Dr Buelah Manis about her pain medication.  ------

## 2013-05-20 NOTE — Telephone Encounter (Signed)
Patient came in to office to speak with MD.   MD states that she will contact patient at later time to discuss.

## 2013-05-20 NOTE — Telephone Encounter (Signed)
Call returned to patient.   Reports that she is going to pain management clinic and they are only giving her Tramadol. States that she is in so much pain that she cannot function.   Reports that she is still having a lot of increased pain. States that she has reported that the Tramadol is not effective with no changes.   Requested referral with new pain clinic.   MD please advise.

## 2013-05-20 NOTE — Progress Notes (Signed)
Patient ID: Tonya Sullivan, female   DOB: 01-Jan-1956, 58 y.o.   MRN: [id number] Post op course The patient is a 58 year old female status post myotomy. Patient was recently admitted secondary to a blood pressure medicine overdose. Patient has since returned from that. Patient currently is having a hard time with her cervical neck pain.  Patient otherwise  eating Well on her own. She does state she has reflux at times.  Assessment and Plan 58 year old female status post myotomy 1. Patient is to continue eating ad lib. 2. I discussed with the patient traced to its upper as possible after meals to help prevent reflux. 3. Patient to continue with Protonix as needed for reflux. 4. Patient follow up as needed   Ralene Ok, MD [facility] General & Minimally Invasive Surgery Trauma & Emergency Surgery

## 2013-05-21 ENCOUNTER — Encounter (HOSPITAL_COMMUNITY): Payer: Self-pay

## 2013-05-21 ENCOUNTER — Emergency Department (HOSPITAL_COMMUNITY)
Admission: EM | Admit: 2013-05-21 | Discharge: 2013-05-21 | Disposition: A | Discharge status: Other Acute Inpatient Hospital | Payer: Medicare Other | Source: Home / Self Care | Attending: Emergency Medicine | Admitting: Emergency Medicine

## 2013-05-21 ENCOUNTER — Telehealth (HOSPITAL_COMMUNITY): Payer: Self-pay | Admitting: *Deleted

## 2013-05-21 ENCOUNTER — Inpatient Hospital Stay (HOSPITAL_COMMUNITY)
Admission: EM | Admit: 2013-05-21 | Discharge: 2013-05-26 | DRG: 885 | Disposition: A | Discharge status: Home / Self Care | Payer: Medicare Other | Source: Intra-hospital | Attending: Emergency Medicine | Admitting: Emergency Medicine

## 2013-05-21 ENCOUNTER — Encounter (HOSPITAL_COMMUNITY): Payer: Self-pay | Admitting: Emergency Medicine

## 2013-05-21 ENCOUNTER — Telehealth (HOSPITAL_COMMUNITY): Payer: Self-pay | Admitting: Psychiatry

## 2013-05-21 DIAGNOSIS — F4310 Post-traumatic stress disorder, unspecified: Secondary | ICD-10-CM | POA: Diagnosis present

## 2013-05-21 DIAGNOSIS — Z598 Other problems related to housing and economic circumstances: Secondary | ICD-10-CM

## 2013-05-21 DIAGNOSIS — G4700 Insomnia, unspecified: Secondary | ICD-10-CM | POA: Diagnosis present

## 2013-05-21 DIAGNOSIS — F319 Bipolar disorder, unspecified: Secondary | ICD-10-CM

## 2013-05-21 DIAGNOSIS — F17200 Nicotine dependence, unspecified, uncomplicated: Secondary | ICD-10-CM | POA: Insufficient documentation

## 2013-05-21 DIAGNOSIS — G8929 Other chronic pain: Secondary | ICD-10-CM | POA: Diagnosis present

## 2013-05-21 DIAGNOSIS — I10 Essential (primary) hypertension: Secondary | ICD-10-CM | POA: Diagnosis present

## 2013-05-21 DIAGNOSIS — Z8673 Personal history of transient ischemic attack (TIA), and cerebral infarction without residual deficits: Secondary | ICD-10-CM | POA: Insufficient documentation

## 2013-05-21 DIAGNOSIS — F332 Major depressive disorder, recurrent severe without psychotic features: Principal | ICD-10-CM | POA: Diagnosis present

## 2013-05-21 DIAGNOSIS — R45851 Suicidal ideations: Secondary | ICD-10-CM | POA: Insufficient documentation

## 2013-05-21 DIAGNOSIS — J449 Chronic obstructive pulmonary disease, unspecified: Secondary | ICD-10-CM | POA: Insufficient documentation

## 2013-05-21 DIAGNOSIS — T07XXXA Unspecified multiple injuries, initial encounter: Secondary | ICD-10-CM | POA: Diagnosis not present

## 2013-05-21 DIAGNOSIS — M5137 Other intervertebral disc degeneration, lumbosacral region: Secondary | ICD-10-CM | POA: Diagnosis present

## 2013-05-21 DIAGNOSIS — E785 Hyperlipidemia, unspecified: Secondary | ICD-10-CM | POA: Diagnosis present

## 2013-05-21 DIAGNOSIS — M51379 Other intervertebral disc degeneration, lumbosacral region without mention of lumbar back pain or lower extremity pain: Secondary | ICD-10-CM | POA: Diagnosis present

## 2013-05-21 DIAGNOSIS — Z862 Personal history of diseases of the blood and blood-forming organs and certain disorders involving the immune mechanism: Secondary | ICD-10-CM | POA: Insufficient documentation

## 2013-05-21 DIAGNOSIS — K219 Gastro-esophageal reflux disease without esophagitis: Secondary | ICD-10-CM | POA: Diagnosis present

## 2013-05-21 DIAGNOSIS — Z79899 Other long term (current) drug therapy: Secondary | ICD-10-CM | POA: Insufficient documentation

## 2013-05-21 DIAGNOSIS — IMO0002 Reserved for concepts with insufficient information to code with codable children: Secondary | ICD-10-CM | POA: Insufficient documentation

## 2013-05-21 DIAGNOSIS — Z8639 Personal history of other endocrine, nutritional and metabolic disease: Secondary | ICD-10-CM | POA: Insufficient documentation

## 2013-05-21 DIAGNOSIS — R4689 Other symptoms and signs involving appearance and behavior: Principal | ICD-10-CM

## 2013-05-21 DIAGNOSIS — Y9210 Unspecified residential institution as the place of occurrence of the external cause: Secondary | ICD-10-CM | POA: Diagnosis not present

## 2013-05-21 DIAGNOSIS — F329 Major depressive disorder, single episode, unspecified: Secondary | ICD-10-CM

## 2013-05-21 DIAGNOSIS — Z8701 Personal history of pneumonia (recurrent): Secondary | ICD-10-CM | POA: Insufficient documentation

## 2013-05-21 DIAGNOSIS — Z8249 Family history of ischemic heart disease and other diseases of the circulatory system: Secondary | ICD-10-CM

## 2013-05-21 DIAGNOSIS — Z85820 Personal history of malignant melanoma of skin: Secondary | ICD-10-CM | POA: Insufficient documentation

## 2013-05-21 DIAGNOSIS — F411 Generalized anxiety disorder: Secondary | ICD-10-CM | POA: Insufficient documentation

## 2013-05-21 DIAGNOSIS — Z872 Personal history of diseases of the skin and subcutaneous tissue: Secondary | ICD-10-CM | POA: Insufficient documentation

## 2013-05-21 DIAGNOSIS — Z5987 Material hardship due to limited financial resources, not elsewhere classified: Secondary | ICD-10-CM

## 2013-05-21 DIAGNOSIS — R4589 Other symptoms and signs involving emotional state: Secondary | ICD-10-CM

## 2013-05-21 DIAGNOSIS — J4489 Other specified chronic obstructive pulmonary disease: Secondary | ICD-10-CM | POA: Diagnosis present

## 2013-05-21 DIAGNOSIS — F32A Depression, unspecified: Secondary | ICD-10-CM

## 2013-05-21 DIAGNOSIS — W19XXXA Unspecified fall, initial encounter: Secondary | ICD-10-CM | POA: Diagnosis not present

## 2013-05-21 DIAGNOSIS — F39 Unspecified mood [affective] disorder: Secondary | ICD-10-CM | POA: Diagnosis present

## 2013-05-21 DIAGNOSIS — Z8739 Personal history of other diseases of the musculoskeletal system and connective tissue: Secondary | ICD-10-CM | POA: Insufficient documentation

## 2013-05-21 LAB — COMPREHENSIVE METABOLIC PANEL
ALT: 11 U/L (ref 0–35)
AST: 19 U/L (ref 0–37)
Albumin: 3.6 g/dL (ref 3.5–5.2)
Alkaline Phosphatase: 65 U/L (ref 39–117)
BUN: 7 mg/dL (ref 6–23)
CALCIUM: 9.3 mg/dL (ref 8.4–10.5)
CO2: 26 meq/L (ref 19–32)
Chloride: 96 mEq/L (ref 96–112)
Creatinine, Ser: 1.01 mg/dL (ref 0.50–1.10)
GFR calc Af Amer: 70 mL/min — ABNORMAL LOW (ref >90)
GFR calc non Af Amer: 61 mL/min — ABNORMAL LOW (ref >90)
Glucose, Bld: 101 mg/dL — ABNORMAL HIGH (ref 70–99)
Potassium: 4.5 mEq/L (ref 3.7–5.3)
Sodium: 135 mEq/L — ABNORMAL LOW (ref 137–147)
TOTAL PROTEIN: 7.3 g/dL (ref 6.0–8.3)
Total Bilirubin: 0.3 mg/dL (ref 0.3–1.2)

## 2013-05-21 LAB — CBC WITH DIFFERENTIAL/PLATELET
BASOS ABS: 0 10*3/uL (ref 0.0–0.1)
BASOS PCT: 0 % (ref 0–1)
EOS ABS: 0 10*3/uL (ref 0.0–0.7)
EOS PCT: 0 % (ref 0–5)
HCT: 38.8 % (ref 36.0–46.0)
Hemoglobin: 12.7 g/dL (ref 12.0–15.0)
LYMPHS PCT: 29 % (ref 12–46)
Lymphs Abs: 1.6 10*3/uL (ref 0.7–4.0)
MCH: 30.2 pg (ref 26.0–34.0)
MCHC: 32.7 g/dL (ref 30.0–36.0)
MCV: 92.4 fL (ref 78.0–100.0)
Monocytes Absolute: 0.3 10*3/uL (ref 0.1–1.0)
Monocytes Relative: 6 % (ref 3–12)
Neutro Abs: 3.6 10*3/uL (ref 1.7–7.7)
Neutrophils Relative %: 65 % (ref 43–77)
PLATELETS: 301 10*3/uL (ref 150–400)
RBC: 4.2 MIL/uL (ref 3.87–5.11)
RDW: 13.4 % (ref 11.5–15.5)
WBC: 5.6 10*3/uL (ref 4.0–10.5)

## 2013-05-21 LAB — RAPID URINE DRUG SCREEN, HOSP PERFORMED
AMPHETAMINES: NOT DETECTED
BARBITURATES: NOT DETECTED
Benzodiazepines: POSITIVE — AB
COCAINE: NOT DETECTED
Opiates: NOT DETECTED
TETRAHYDROCANNABINOL: NOT DETECTED

## 2013-05-21 LAB — ETHANOL: Alcohol, Ethyl (B): <11 mg/dL (ref 0–11)

## 2013-05-21 MED ORDER — DULOXETINE HCL 60 MG PO CPEP
60.0000 mg | ORAL_CAPSULE | Freq: Every morning | ORAL | Status: DC
  Administered 2013-05-22 – 2013-05-26 (×5): 60 mg via ORAL
  Filled 2013-05-21: qty 3
  Filled 2013-05-21 (×7): qty 1

## 2013-05-21 MED ORDER — OXYBUTYNIN CHLORIDE ER 10 MG PO TB24
10.0000 mg | ORAL_TABLET | Freq: Every day | ORAL | Status: DC
  Administered 2013-05-22 – 2013-05-26 (×5): 10 mg via ORAL
  Filled 2013-05-21 (×8): qty 1

## 2013-05-21 MED ORDER — ALUM & MAG HYDROXIDE-SIMETH 200-200-20 MG/5ML PO SUSP: 30.0000 mL | ORAL | Status: DC | PRN

## 2013-05-21 MED ORDER — ALBUTEROL SULFATE HFA 108 (90 BASE) MCG/ACT IN AERS: 2.0000 | INHALATION_SPRAY | Freq: Three times a day (TID) | RESPIRATORY_TRACT | Status: DC | PRN

## 2013-05-21 MED ORDER — AMITRIPTYLINE HCL 100 MG PO TABS
100.0000 mg | ORAL_TABLET | Freq: Every day | ORAL | Status: DC
  Administered 2013-05-21 – 2013-05-25 (×5): 100 mg via ORAL
  Filled 2013-05-21: qty 1
  Filled 2013-05-21: qty 3
  Filled 2013-05-21 (×2): qty 4
  Filled 2013-05-21 (×2): qty 1
  Filled 2013-05-21: qty 4
  Filled 2013-05-21 (×4): qty 1

## 2013-05-21 MED ORDER — SIMVASTATIN 20 MG PO TABS
20.0000 mg | ORAL_TABLET | Freq: Every day | ORAL | Status: DC
  Administered 2013-05-22 – 2013-05-25 (×4): 20 mg via ORAL
  Filled 2013-05-21 (×7): qty 1

## 2013-05-21 MED ORDER — ESCITALOPRAM OXALATE 20 MG PO TABS
20.0000 mg | ORAL_TABLET | ORAL | Status: DC
  Administered 2013-05-22: 20 mg via ORAL
  Filled 2013-05-21 (×2): qty 1
  Filled 2013-05-21: qty 2
  Filled 2013-05-21: qty 1

## 2013-05-21 MED ORDER — TIOTROPIUM BROMIDE MONOHYDRATE 18 MCG IN CAPS
18.0000 ug | ORAL_CAPSULE | Freq: Every day | RESPIRATORY_TRACT | Status: DC
  Administered 2013-05-22 – 2013-05-26 (×5): 18 ug via RESPIRATORY_TRACT
  Filled 2013-05-21 (×2): qty 5

## 2013-05-21 MED ORDER — TRAMADOL HCL 50 MG PO TABS
50.0000 mg | ORAL_TABLET | Freq: Three times a day (TID) | ORAL | Status: DC | PRN
  Administered 2013-05-22 – 2013-05-26 (×10): 50 mg via ORAL
  Filled 2013-05-21 (×10): qty 1

## 2013-05-21 MED ORDER — PREDNISONE 5 MG PO TABS
7.5000 mg | ORAL_TABLET | Freq: Every day | ORAL | Status: DC
  Administered 2013-05-22 – 2013-05-26 (×5): 7.5 mg via ORAL
  Filled 2013-05-21: qty 1
  Filled 2013-05-21: qty 2
  Filled 2013-05-21 (×8): qty 1

## 2013-05-21 MED ORDER — BACLOFEN 5 MG HALF TABLET
5.0000 mg | ORAL_TABLET | Freq: Two times a day (BID) | ORAL | Status: DC
  Administered 2013-05-22 – 2013-05-24 (×6): 5 mg via ORAL
  Administered 2013-05-25: 0.5 mg via ORAL
  Administered 2013-05-25 – 2013-05-26 (×2): 5 mg via ORAL
  Filled 2013-05-21 (×18): qty 1

## 2013-05-21 MED ORDER — MAGNESIUM HYDROXIDE 400 MG/5ML PO SUSP: 30.0000 mL | Freq: Every day | ORAL | Status: DC | PRN

## 2013-05-21 MED ORDER — PANTOPRAZOLE SODIUM 40 MG PO TBEC
40.0000 mg | DELAYED_RELEASE_TABLET | Freq: Every day | ORAL | Status: DC
  Administered 2013-05-22 – 2013-05-26 (×5): 40 mg via ORAL
  Filled 2013-05-21 (×8): qty 1

## 2013-05-21 MED ORDER — METOPROLOL TARTRATE 25 MG PO TABS
25.0000 mg | ORAL_TABLET | Freq: Two times a day (BID) | ORAL | Status: DC
  Administered 2013-05-22 – 2013-05-26 (×9): 25 mg via ORAL
  Filled 2013-05-21 (×15): qty 1

## 2013-05-21 NOTE — ED Notes (Signed)
Pt states she is not suicidal and does not hear voices telling her to harm herself or others. Pt says she is having trouble at home dealing with her son and his girlfriend but would not elaborate.

## 2013-05-21 NOTE — ED Notes (Addendum)
Sent here by Dr Harrington Challenger for eval.  Pt says she is bipolar.    Wanded at triage.  Says she is suicidal

## 2013-05-21 NOTE — ED Provider Notes (Signed)
CSN: [id number]     Arrival date & time 05/21/13  1450 History  This chart was scribed for Tonya Diego, MD by Ludger Nutting, ED Scribe. This patient was seen in room [facility] and the patient's care was started 3:32 PM.    Chief Complaint  Patient presents with  . V70.1      Patient is a 58 y.o. female presenting with mental health disorder. The history is provided by the patient. No language interpreter was used.  Mental Health Problem Presenting symptoms: depression   Presenting symptoms: no hallucinations, no homicidal ideas and no suicidal thoughts   Onset quality:  Gradual Duration:  2 days Timing:  Constant Progression:  Unchanged Chronicity:  Chronic Relieved by:  Nothing Associated symptoms: no abdominal pain, no appetite change, no chest pain, no fatigue and no headaches     HPI Comments: Tonya Sullivan is a 58 y.o. female who presents to the Emergency Department for a psychological evaluation today. Patient states she has not been able to contact her son and she has conflict with his girlfriend. She reports calling Dr. Harrington Challenger yesterday stating she was suicidal who advised her to come to the ED. She denies SI, audiovisual hallucinations, HI.   Past Medical History  Diagnosis Date  . COPD (chronic obstructive pulmonary disease)   . Depression   . Chronic back pain     DDD, disc bulge, radiculopathy, spinal stenosis  . Hyperlipidemia   . Anxiety   . Bipolar disorder, unspecified   . Carotid artery calcification   . DDD (degenerative disc disease), lumbosacral   . Spinal stenosis, lumbar   . Thyroid disease   . Autoimmune thyroiditis   . Hypertension   . TIA (transient ischemic attack)     august 2014  . GERD (gastroesophageal reflux disease)   . H/O dizziness   . Neuropathic pain   . Adrenal gland dysfunction     Addison's disease ON DAILY PREDNISONE  . Addison's disease   . Stroke     HX OF MINI STROKE AUG 2014- DROPPIN EVERYTHING, SPEECH SLURRED, MEMORY  AFFECTED --ALL RESOLVED - NO DEFICITS NOW  . Pneumonia     LAST PNEUMONIA WAS AUG 2014  . Achalasia   . Mouth ulcers   . Dysrhythmia     palpitations  . Melanoma     removed 1 week ago at Dr Teryl Lucy office   Past Surgical History  Procedure Laterality Date  . Appendectomy    . Cholecystectomy    . Lump left breast      removed- benign  . Abdominal hysterectomy      tubal pregnancy  . Inguinal hernia repair    . Stomach surgery      ?holes in esophgous  . Ectopic pregnancy surgery    . Back surgery      x 5;1984;1989;;1999;2000;2010  . Colonoscopy  01/25/2012    Procedure: COLONOSCOPY;  Surgeon: Rogene Houston, MD;  Location: [facility];  Service: Endoscopy;  Laterality: N/A;  130  . Esophagogastroduodenoscopy (egd) with esophageal dilation N/A 08/13/2012    Procedure: ESOPHAGOGASTRODUODENOSCOPY (EGD) WITH ESOPHAGEAL DILATION;  Surgeon: Rogene Houston, MD;  Location: [facility];  Service: Endoscopy;  Laterality: N/A;  315  . Anterior cervical decomp/discectomy fusion N/A 10/21/2012    Procedure: Cervical Six-Seven Anterior cervical decompression/diskectomy/fusion;  Surgeon: Kristeen Miss, MD;  [facility];  Service: Neurosurgery;  Laterality: N/A;  Cervical Six-Seven Anterior cervical decompression/diskectomy/fusion  . Cataract extraction w/phaco  Right 11/25/2012    Procedure: CATARACT EXTRACTION PHACO AND INTRAOCULAR LENS PLACEMENT (IOC);  Surgeon: Elta Guadeloupe T. Gershon Crane, MD;  Location: [facility];  Service: Ophthalmology;  Laterality: Right;  CDE:10.06  . Cataract extraction w/phaco Left 12/09/2012    Procedure: CATARACT EXTRACTION PHACO AND INTRAOCULAR LENS PLACEMENT (IOC);  Surgeon: Elta Guadeloupe T. Gershon Crane, MD;  Location: [facility];  Service: Ophthalmology;  Laterality: Left;  CDE:5.06  . Tonsillectomy    . Hiatal hernia repair    . Heller myotomy N/A 03/17/2013    Procedure: DIAGNOSTIC LAPAROSCOPY, LAPAROSCOPIC HELLER MYOTOMY, ENDOSCOPY, DOR FUNDOPLICATION;  Surgeon: Ralene Ok,  MD;  Location: [facility];  Service: General;  Laterality: N/A;  . Breast surgery      left breast  . Eye surgery     Family History  Problem Relation Age of Onset  . Hypertension Mother   . Heart disease Mother   . Heart attack Mother   . Anxiety disorder Mother   . Cancer Father     lung cancer  . Heart attack Father   . Alcohol abuse Father   . Hypertension Brother   . Alcohol abuse Brother   . Bipolar disorder Brother   . ADD / ADHD Brother   . Drug abuse Brother   . OCD Brother   . Alcohol abuse Sister   . Bipolar disorder Sister   . Anxiety disorder Sister   . ADD / ADHD Sister   . Drug abuse Sister   . Physical abuse Sister   . Sexual abuse Sister   . Alcohol abuse Brother   . Bipolar disorder Brother   . ADD / ADHD Brother   . Drug abuse Brother   . Alcohol abuse Sister   . Bipolar disorder Sister   . Anxiety disorder Sister   . ADD / ADHD Sister   . Physical abuse Sister   . Sexual abuse Sister   . Alcohol abuse Brother   . Bipolar disorder Brother   . ADD / ADHD Brother   . Bipolar disorder Brother   . ADD / ADHD Brother   . Alcohol abuse Brother   . Paranoid behavior Brother   . Physical abuse Brother   . Sexual abuse Brother   . Bipolar disorder Maternal Aunt   . Bipolar disorder Paternal Aunt   . Bipolar disorder Maternal Uncle   . Bipolar disorder Paternal Uncle   . Bipolar disorder Maternal Grandfather   . Alcohol abuse Maternal Grandfather   . Bipolar disorder Maternal Grandmother   . Alcohol abuse Maternal Grandmother   . Anxiety disorder Maternal Grandmother   . Dementia Maternal Grandmother   . Bipolar disorder Paternal Grandfather   . Alcohol abuse Paternal Grandfather   . Bipolar disorder Paternal Grandmother   . Alcohol abuse Paternal Grandmother   . Drug abuse Paternal Grandmother   . Bipolar disorder Maternal Uncle   . Schizophrenia Neg Hx   . Seizures Neg Hx    History  Substance Use Topics  . Smoking status: Current Some Day  Smoker -- 0.25 packs/day for 40 years    Types: Cigarettes    Last Attempt to Quit: 03/05/2013  . Smokeless tobacco: Never Used     Comment: 5 cigs day 03/12/13  . Alcohol Use: No   OB History   Grav Para Term Preterm Abortions TAB SAB Ect Mult Living   2 1 1  1   1  1      Review of Systems  Constitutional: Negative for  appetite change and fatigue.       Per HPI, otherwise negative  HENT: Negative for congestion, ear discharge and sinus pressure.        Per HPI, otherwise negative  Eyes: Negative for discharge.  Respiratory: Negative for cough.        Per HPI, otherwise negative  Cardiovascular: Negative for chest pain.       Per HPI, otherwise negative  Gastrointestinal: Negative for vomiting, abdominal pain and diarrhea.  Endocrine:       Negative aside from HPI  Genitourinary: Negative for frequency and hematuria.       Neg aside from HPI   Musculoskeletal: Negative for back pain.       Per HPI, otherwise negative  Skin: Negative.  Negative for rash.  Neurological: Negative for seizures, syncope and headaches.  Psychiatric/Behavioral: Negative for suicidal ideas, homicidal ideas and hallucinations.      Allergies  Codeine; Cyclobenzaprine; Darvocet; Abilify; Ace inhibitors; Adhesive; Gabapentin; Iodine; Levofloxacin; Prednisone; Sulfa antibiotics; and Robaxin  Home Medications   Prior to Admission medications   Medication Sig Start Date End Date Taking? Authorizing Provider  albuterol (PROVENTIL HFA;VENTOLIN HFA) 108 (90 BASE) MCG/ACT inhaler Inhale 2 puffs into the lungs 3 (three) times daily as needed for wheezing or shortness of breath.    Historical Provider, MD  albuterol (PROVENTIL) (2.5 MG/3ML) 0.083% nebulizer solution Take 2.5 mg by nebulization every 4 (four) hours as needed for wheezing. 03/16/13   Alycia Rossetti, MD  amitriptyline (ELAVIL) 100 MG tablet Take 1 tablet (100 mg total) by mouth at bedtime. 05/01/13   Levonne Spiller, MD  baclofen (LIORESAL) 10 MG  tablet Take 5 mg by mouth 2 (two) times daily.     Historical Provider, MD  clonazePAM (KLONOPIN) 1 MG tablet Take 1 tablet (1 mg total) by mouth 4 (four) times daily as needed for anxiety. 05/01/13   Levonne Spiller, MD  DULoxetine (CYMBALTA) 60 MG capsule Take 1 capsule (60 mg total) by mouth every morning. 05/01/13   Levonne Spiller, MD  escitalopram (LEXAPRO) 20 MG tablet Take 1 tablet (20 mg total) by mouth every morning. 05/01/13 05/01/14  Levonne Spiller, MD  fluocinonide gel (LIDEX) 6.96 % Apply 1 application topically 2 (two) times daily. 04/09/13   Historical Provider, MD  lidocaine (LIDODERM) 5 % Place 1 patch onto the skin every 12 (twelve) hours. 04/22/13   Historical Provider, MD  lidocaine (XYLOCAINE) 2 % solution Use as directed 20 mLs in the mouth or throat every 4 (four) hours as needed for mouth pain.  04/18/13   Historical Provider, MD  metoprolol tartrate (LOPRESSOR) 25 MG tablet Take 1 tablet (25 mg total) by mouth 2 (two) times daily with a meal. 04/13/13   Annita Brod, MD  oxybutynin (DITROPAN XL) 10 MG 24 hr tablet Take 1 tablet (10 mg total) by mouth daily. 02/27/13   Alycia Rossetti, MD  pantoprazole (PROTONIX) 40 MG tablet Take 1 tablet (40 mg total) by mouth daily. 04/24/13   Kathie Dike, MD  pravastatin (PRAVACHOL) 40 MG tablet Take 1 tablet (40 mg total) by mouth every evening. 02/27/13   Alycia Rossetti, MD  predniSONE (DELTASONE) 2.5 MG tablet Take 3 tablets (7.5 mg total) by mouth daily with breakfast. 04/24/13   Kathie Dike, MD  tiotropium (SPIRIVA) 18 MCG inhalation capsule Place 1 capsule (18 mcg total) into inhaler and inhale daily. USES IN THE EVENING 02/27/13   Alycia Rossetti, MD  traMADol Veatrice Bourbon) 50  MG tablet Take by mouth every 8 (eight) hours as needed.    Historical Provider, MD   BP 133/70  Pulse 65  Temp(Src) 98.2 F (36.8 C) (Oral)  Resp 20  Ht 5\' 7"  (1.702 m)  Wt 194 lb (87.998 kg)  BMI 30.38 kg/m2  SpO2 97% Physical Exam  Constitutional: She is  oriented to person, place, and time. She appears well-developed.  HENT:  Head: Normocephalic.  Eyes: Conjunctivae and EOM are normal. No scleral icterus.  Neck: Neck supple. No thyromegaly present.  Cardiovascular: Normal rate and regular rhythm.  Exam reveals no gallop and no friction rub.   No murmur heard. Pulmonary/Chest: No stridor. She has no wheezes. She has no rales. She exhibits no tenderness.  Abdominal: She exhibits no distension. There is no tenderness. There is no rebound.  Musculoskeletal: Normal range of motion. She exhibits no edema.  Lymphadenopathy:    She has no cervical adenopathy.  Neurological: She is oriented to person, place, and time. She exhibits normal muscle tone. Coordination normal.  Skin: No rash noted. No erythema.  Psychiatric: Her behavior is normal. She exhibits a depressed mood. She expresses no suicidal ideation.    ED Course  Procedures (including critical care time)  DIAGNOSTIC STUDIES: Oxygen Saturation is 97% on RA, adequate by my interpretation.    COORDINATION OF CARE: 3:35 PM Discussed treatment plan with pt at bedside and pt agreed to plan.   Labs Review Labs Reviewed  CBC WITH DIFFERENTIAL  COMPREHENSIVE METABOLIC PANEL  ETHANOL  URINE RAPID DRUG SCREEN (HOSP PERFORMED)    Imaging Review No results found.   EKG Interpretation None      MDM   Final diagnoses:  None   The chart was scribed for me under my direct supervision.  I personally performed the history, physical, and medical decision making and all procedures in the evaluation of this patient.Tonya Diego, MD 05/21/13 [phone]

## 2013-05-21 NOTE — ED Notes (Signed)
[facility] called for transport to [facility].

## 2013-05-21 NOTE — ED Notes (Signed)
Dr. Harrington Challenger called earlier this morning and stated she was sending pt over to be evaluated. Pt told Dr. Harrington Challenger, in a phone conversation,  that she was having suicidal thoughts. Pt  Was to come in once her CNA arrived to bring her in. Pt currently denies SI/HI. Pt states, "I've just been trying to get in to see my doctor because she is the best. I have been having trouble with my sons and needed some support, but I can't get her to call me back" Pt continues to deny SI/HI. Attempted to phone Dr. Harrington Challenger' office to inform her of this but there was no answer.

## 2013-05-21 NOTE — Telephone Encounter (Signed)
Spoke to patient at length. She feels suicidal and is back and forth about wanting to harm self. Her son and Daughter in law have cut off contact, feels like she has no reason to live. She agrees to go to [facility] ER for admit to psychiatry when CNA can drive her in one hour. ER has been contacted

## 2013-05-21 NOTE — Tx Team (Signed)
Initial Interdisciplinary Treatment Plan  PATIENT STRENGTHS: (choose at least two) General fund of knowledge  PATIENT STRESSORS: Marital or family conflict   PROBLEM LIST: Problem List/Patient Goals Date to be addressed Date deferred Reason deferred Estimated date of resolution  Depression                                                       DISCHARGE CRITERIA:  Improved stabilization in mood, thinking, and/or behavior Verbal commitment to aftercare and medication compliance  PRELIMINARY DISCHARGE PLAN: Attend aftercare/continuing care group Participate in family therapy Return to previous living arrangement  PATIENT/FAMIILY INVOLVEMENT: This treatment plan has been presented to and reviewed with the patient, Tonya Sullivan, and/or family member, .  The patient and family have been given the opportunity to ask questions and make suggestions.  Tonya Sullivan 05/21/2013, 11:36 PM

## 2013-05-21 NOTE — Progress Notes (Signed)
Pt is a 58 year old female admitted with depression and suicidal ideation  She does deny suicidal ideation at present but was having thoughts prior to coming in to the hospital    Pt fell 3 weeks ago and said she does fall from time to time   Pt has addisons disease so she takes prednisone  Pt also had a stroke in august of 2014  She reports increased depression because a family conflict a few weeks ago  Pt has pressured speech  She is cooperative during the admission process  Pt denies drug or ETOH abuse   Provided nourishment for pt   Verbal support given   Medications administered and effectiveness monitored   Q 15 min checks   Pt safe and adjusting well
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
## 2013-05-22 ENCOUNTER — Telehealth (HOSPITAL_COMMUNITY): Payer: Self-pay | Admitting: *Deleted

## 2013-05-22 ENCOUNTER — Telehealth: Payer: Self-pay | Admitting: *Deleted

## 2013-05-22 DIAGNOSIS — R45851 Suicidal ideations: Secondary | ICD-10-CM

## 2013-05-22 DIAGNOSIS — F4310 Post-traumatic stress disorder, unspecified: Secondary | ICD-10-CM | POA: Diagnosis present

## 2013-05-22 DIAGNOSIS — F411 Generalized anxiety disorder: Secondary | ICD-10-CM

## 2013-05-22 DIAGNOSIS — F39 Unspecified mood [affective] disorder: Secondary | ICD-10-CM

## 2013-05-22 MED ORDER — LIDOCAINE 5 % EX PTCH
3.0000 | MEDICATED_PATCH | CUTANEOUS | Status: DC
  Administered 2013-05-22 – 2013-05-24 (×3): 3 via TRANSDERMAL
  Filled 2013-05-22 (×6): qty 3

## 2013-05-22 MED ORDER — CLONAZEPAM 0.5 MG PO TABS
0.5000 mg | ORAL_TABLET | Freq: Three times a day (TID) | ORAL | Status: DC | PRN
  Administered 2013-05-22 – 2013-05-25 (×10): 0.5 mg via ORAL
  Filled 2013-05-22 (×11): qty 1

## 2013-05-22 NOTE — BHH Group Notes (Signed)
[facility] LCSW Aftercare Discharge Planning Group Note   05/22/2013 10:12 AM  Participation Quality:  DID NOT ATTEND-sleeping in room   [facility]

## 2013-05-22 NOTE — Telephone Encounter (Signed)
Told mom pt was admitted to [facility]

## 2013-05-22 NOTE — Telephone Encounter (Signed)
Message copied by Sheral Flow on Fri May 22, 2013  3:31 PM ------      Message from: Devoria Glassing      Created: Fri May 22, 2013  3:24 PM       Nicki Reaper forrest from [facility] calling to let you know the status of this patient being in a mental health institution in [place]       For suicidal behavior now is [place] [facility] behavioral health       [phone] ------

## 2013-05-22 NOTE — BHH Suicide Risk Assessment (Signed)
Suicide Risk Assessment  Admission Assessment     Nursing information obtained from:    Demographic factors:    Current Mental Status:    Loss Factors:    Historical Factors:    Risk Reduction Factors:    Total Time spent with patient: 45 minutes  CLINICAL FACTORS:   Depression:   Impulsivity Insomnia  PsCOGNITIVE FEATURES THAT CONTRIBUTE TO RISK:  Closed-mindedness Polarized thinking Thought constriction (tunnel vision)    SUICIDE RISK:   Moderate:  Frequent suicidal ideation with limited intensity, and duration, some specificity in terms of plans, no associated intent, good self-control, limited dysphoria/symptomatology, some risk factors present, and identifiable protective factors, including available and accessible social support.  PLAN OF CARE: Supportive approach/coping skills/relapse prevention                               Reassess mood state R/O effect of antidepressant/steroids  I certify that inpatient services furnished can reasonably be expected to improve the patient's condition.  Nicholaus Bloom 05/22/2013, 3:29 PM

## 2013-05-22 NOTE — Telephone Encounter (Signed)
noted 

## 2013-05-22 NOTE — BHH Group Notes (Signed)
[place] LCSW Group Therapy  05/22/2013 2:38 PM  Type of Therapy:  Group Therapy  Participation Level:  Did Not Attend-pt in room sleeping.    Tonya Sullivan Smart LCSWA  05/22/2013, 2:38 PM

## 2013-05-22 NOTE — Tx Team (Signed)
Interdisciplinary Treatment Plan Update (Adult)  Date: 05/22/2013   Time Reviewed: 11:13 AM  Progress in Treatment:  Attending groups: No.  Participating in groups:  No.  Taking medication as prescribed: Yes  Tolerating medication: Yes  Family/Significant othe contact made: Not yet. SPE required for pt.   Patient understands diagnosis: Yes, AEB seeking treatment for SI, depression, med management, and mood stabilization. Discussing patient identified problems/goals with staff: Yes  Medical problems stabilized or resolved: Yes  Denies suicidal/homicidal ideation: Yes during self report.  Patient has not harmed self or Others: Yes  New problem(s) identified:  Discharge Plan or Barriers: Pt reports that she sees Dr. Harrington Challenger and Maurice Small at [facility] o/p in [place]. She plans to return home at d/c and continue followup with current providers.  Additional comments: Pt is a 58 year old female admitted with depression and suicidal ideation She does deny suicidal ideation at present but was having thoughts prior to coming in to the hospital Pt fell 3 weeks ago and said she does fall from time to time Pt has addisons disease so she takes prednisone Pt also had a stroke in august of 2014 She reports increased depression because a family conflict a few weeks ago Pt has pressured speech She is cooperative during the admission process Pt denies drug or ETOH abuse Provided nourishment for pt  Reason for Continuation of Hospitalization: Mood stabilization Medication management  Estimated length of stay: 3-5 days  For review of initial/current patient goals, please see plan of care.  Attendees:  Patient:    Family:    Physician: Carlton Adam MD 05/22/2013 11:12 AM   Nursing: Oletta Darter RN  05/22/2013 11:12 AM   Clinical Social Worker [place], [place]  05/22/2013 11:12 AM   Other: Karsten Fells RN  05/22/2013 11:12 AM   Other:    Other: Gerline Legacy Nurse CM  05/22/2013 11:12 AM   Other:    Scribe for Treatment  Team:  [place] LCSWA 05/22/2013 11:12 AM

## 2013-05-22 NOTE — Telephone Encounter (Signed)
Call returned to [place], [place] that patient called while in ER at [facility]. Reports that patient states that Dr. Harrington Challenger recommended that she go to ER. Per chart review, patient was admitted to ER with suicidal behavior. Patient has been transferred to [facility] in [place].   Also reported that he is concerned about current state of memory and was concerned that possible carotid stenosis could be the cause.   MD to be made aware.

## 2013-05-22 NOTE — Telephone Encounter (Signed)
Informed brother of pts admit to [facility]

## 2013-05-22 NOTE — Clinical Social Work Note (Signed)
CSW attempted to meet with pt after group in order to complete PSA and discuss aftercare/get consent to speak with family for collateral info. Pt sleeping in room and did not wake up when CSW attempted to wake her. MD notified.   [place], [place] 05/22/2013 2:40 PM

## 2013-05-22 NOTE — Progress Notes (Signed)
Patient ID: Tonya Sullivan, female   DOB: 1955/05/25, 58 y.o.   MRN: [id number]   D: Patient anxious on approach. Requesting pain medication on approach. Has already gotten Tramadol at 0513. Patient says she should be getting percocet. No hx of recent percocet order for patient. Also requesting klonopin which is not ordered for patient but patient does have a prescription outpatient. Currently denies any SI at this time. A: Staff will monitor on q 15 minute checks, follow treatment plan, and give meds as ordered. R: Very needy and demanding this am.
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
## 2013-05-22 NOTE — Care Management Utilization Note (Signed)
Per State Regulation 482.30  The chart was reviewed for necessity with respect to the patient's Admission/ Duration of stay. Admit [birth year] 10, 1955 SI  Next Review SLPN:3/00/51  Conception Oms, RN, BSN

## 2013-05-22 NOTE — Progress Notes (Addendum)
Pt stated that she is here with all the people for a birthday party. Pt is alert and oreinted times three. Pt is very intrusive and has a flight of ideas. She is hyperverbal at times and stated,"my kids think I am crazy."pt is insistent that she needs percocet. Pt remains very argumentative and intrusive. Supper was brought back for her as she stated she has to have a wheelchair to get around.Pt is demanding to be put on percocet . Informed pt that MD would be made aware. Pt was given clonipine for her nerves. 6:20pm-Pt was given 50mg  of tramadol for back pain a 10/10. 7:10pm Pt states her back pain is now a 20/10. Note left for NP to address pain concerns.

## 2013-05-23 ENCOUNTER — Inpatient Hospital Stay (HOSPITAL_COMMUNITY): Payer: Medicare Other

## 2013-05-23 ENCOUNTER — Encounter (HOSPITAL_COMMUNITY): Payer: Self-pay | Admitting: Emergency Medicine

## 2013-05-23 MED ORDER — OXYCODONE HCL 5 MG PO TABS
5.0000 mg | ORAL_TABLET | Freq: Three times a day (TID) | ORAL | Status: AC | PRN
  Administered 2013-05-23 – 2013-05-24 (×4): 5 mg via ORAL
  Filled 2013-05-23 (×4): qty 1

## 2013-05-23 MED ORDER — OXYCODONE-ACETAMINOPHEN 5-325 MG PO TABS
1.0000 | ORAL_TABLET | Freq: Once | ORAL | Status: AC
  Administered 2013-05-23: 1 via ORAL
  Filled 2013-05-23: qty 1

## 2013-05-23 MED ORDER — OXYCODONE HCL 5 MG PO TABS: 5.0000 mg | ORAL_TABLET | Freq: Four times a day (QID) | ORAL | Status: DC | PRN
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
## 2013-05-23 NOTE — Progress Notes (Signed)
Patient angry. Raising voice in hallway. Complaining nursing staff are disrespectful to her. Requested to speak with [facility]. Patient demanding medication. States she has not received any medications for the first 2.5 days she has been at [facility].  States she keeps being told to speak to her nurse and is getting the "run around". Says she has not gotten her 4 pm medications.   Spoke with nursing staff. Several nurses have attempted to de-escalate patient. Two nurses have reviewed her medication list and medication times with patient. Medication list was printed off and reviewed with patient. I showed patient medications and times she has received medication since admission.Gave patient medication list so she can see when she can have more medication.    Patient denies being disrespectful but one time.  Encouraged patient to speak with her assigned nurse when needing medications rather than many staff, to help alleviate her confusion and frustration. Darlyne Russian, PA ordered a new pain medication for patient which was administered by her nurse.   Patient states she believes nursing staff will not be responsive to her tomorrow and anticipates a bad day.  Helped patient identify ways to communicate needs.  Patient settled in bed for the night. Denied any further needs. Clayborne Dana, RN

## 2013-05-23 NOTE — ED Notes (Signed)
Received pt from [facility] with c/o pt was at med window, while waiting pt attempted to sit on a bench but missed bench. Pt c/o pain to neck, right side of head, right hip and right back. Pt reports that she has chronic back pain but is feeling worse. Pt presents with c-collar applied by EMS.

## 2013-05-23 NOTE — ED Notes (Signed)
Patient pressing call light multiple times wanting  multiple items. Patient then came out to hallway wanting coffee, yelling at staff. Patient escorted back to room D 30 and given coffee.

## 2013-05-23 NOTE — ED Provider Notes (Addendum)
CSN: [id number]     Arrival date & time 05/23/13  0827 History   First MD Initiated Contact with Patient 05/23/13 [id number]     Chief Complaint  Patient presents with  . Fall  . Neck Pain  . Back Pain  . Hip Pain     HPI  HEENT patient [facility]. She states she is at the medicine window for her morning meds. She was told it was not time. She was asked to sit down. She states that she went to sit on a bench. However, when to set down" the bench wasn't there anymore". Similar event happened for a few days ago there as well. She states search on right side of her head, her right hip in her low back. States she has chronic back pain but is worse today. No loss of consciousness. No numbness weakness or tingling.  Past Medical History  Diagnosis Date  . COPD (chronic obstructive pulmonary disease)   . Depression   . Chronic back pain     DDD, disc bulge, radiculopathy, spinal stenosis  . Hyperlipidemia   . Anxiety   . Bipolar disorder, unspecified   . Carotid artery calcification   . DDD (degenerative disc disease), lumbosacral   . Spinal stenosis, lumbar   . Thyroid disease   . Autoimmune thyroiditis   . Hypertension   . TIA (transient ischemic attack)     august 2014  . GERD (gastroesophageal reflux disease)   . H/O dizziness   . Neuropathic pain   . Adrenal gland dysfunction     Addison's disease ON DAILY PREDNISONE  . Addison's disease   . Stroke     HX OF MINI STROKE AUG 2014- DROPPIN EVERYTHING, SPEECH SLURRED, MEMORY AFFECTED --ALL RESOLVED - NO DEFICITS NOW  . Pneumonia     LAST PNEUMONIA WAS AUG 2014  . Achalasia   . Mouth ulcers   . Dysrhythmia     palpitations  . Melanoma     removed 1 week ago at Dr Teryl Lucy office  . Addison's disease    Past Surgical History  Procedure Laterality Date  . Appendectomy    . Cholecystectomy    . Lump left breast      removed- benign  . Abdominal hysterectomy      tubal pregnancy  . Inguinal  hernia repair    . Stomach surgery      ?holes in esophgous  . Ectopic pregnancy surgery    . Back surgery      x 5;1984;1989;;1999;2000;2010  . Colonoscopy  01/25/2012    Procedure: COLONOSCOPY;  Surgeon: Rogene Houston, MD;  Location: [facility];  Service: Endoscopy;  Laterality: N/A;  130  . Esophagogastroduodenoscopy (egd) with esophageal dilation N/A 08/13/2012    Procedure: ESOPHAGOGASTRODUODENOSCOPY (EGD) WITH ESOPHAGEAL DILATION;  Surgeon: Rogene Houston, MD;  Location: [facility];  Service: Endoscopy;  Laterality: N/A;  315  . Anterior cervical decomp/discectomy fusion N/A 10/21/2012    Procedure: Cervical Six-Seven Anterior cervical decompression/diskectomy/fusion;  Surgeon: Kristeen Miss, MD;  Location: [facility];  Service: Neurosurgery;  Laterality: N/A;  Cervical Six-Seven Anterior cervical decompression/diskectomy/fusion  . Cataract extraction w/phaco Right 11/25/2012    Procedure: CATARACT EXTRACTION PHACO AND INTRAOCULAR LENS PLACEMENT (IOC);  Surgeon: Elta Guadeloupe T. Gershon Crane, MD;  Location: [facility];  Service: Ophthalmology;  Laterality: Right;  CDE:10.06  . Cataract extraction w/phaco Left 12/09/2012    Procedure: CATARACT EXTRACTION PHACO AND INTRAOCULAR LENS PLACEMENT (IOC);  Surgeon: Elta Guadeloupe T. Gershon Crane, MD;  Location: [facility];  Service: Ophthalmology;  Laterality: Left;  CDE:5.06  . Tonsillectomy    . Hiatal hernia repair    . Heller myotomy N/A 03/17/2013    Procedure: DIAGNOSTIC LAPAROSCOPY, LAPAROSCOPIC HELLER MYOTOMY, ENDOSCOPY, DOR FUNDOPLICATION;  Surgeon: Ralene Ok, MD;  Location: [facility];  Service: General;  Laterality: N/A;  . Breast surgery      left breast  . Eye surgery     Family History  Problem Relation Age of Onset  . Hypertension Mother   . Heart disease Mother   . Heart attack Mother   . Anxiety disorder Mother   . Cancer Father     lung cancer  . Heart attack Father   . Alcohol abuse Father   . Hypertension Brother   . Alcohol abuse Brother   .  Bipolar disorder Brother   . ADD / ADHD Brother   . Drug abuse Brother   . OCD Brother   . Alcohol abuse Sister   . Bipolar disorder Sister   . Anxiety disorder Sister   . ADD / ADHD Sister   . Drug abuse Sister   . Physical abuse Sister   . Sexual abuse Sister   . Alcohol abuse Brother   . Bipolar disorder Brother   . ADD / ADHD Brother   . Drug abuse Brother   . Alcohol abuse Sister   . Bipolar disorder Sister   . Anxiety disorder Sister   . ADD / ADHD Sister   . Physical abuse Sister   . Sexual abuse Sister   . Alcohol abuse Brother   . Bipolar disorder Brother   . ADD / ADHD Brother   . Bipolar disorder Brother   . ADD / ADHD Brother   . Alcohol abuse Brother   . Paranoid behavior Brother   . Physical abuse Brother   . Sexual abuse Brother   . Bipolar disorder Maternal Aunt   . Bipolar disorder Paternal Aunt   . Bipolar disorder Maternal Uncle   . Bipolar disorder Paternal Uncle   . Bipolar disorder Maternal Grandfather   . Alcohol abuse Maternal Grandfather   . Bipolar disorder Maternal Grandmother   . Alcohol abuse Maternal Grandmother   . Anxiety disorder Maternal Grandmother   . Dementia Maternal Grandmother   . Bipolar disorder Paternal Grandfather   . Alcohol abuse Paternal Grandfather   . Bipolar disorder Paternal Grandmother   . Alcohol abuse Paternal Grandmother   . Drug abuse Paternal Grandmother   . Bipolar disorder Maternal Uncle   . Schizophrenia Neg Hx   . Seizures Neg Hx    History  Substance Use Topics  . Smoking status: Current Some Day Smoker -- 0.25 packs/day for 40 years    Types: Cigarettes    Last Attempt to Quit: 03/05/2013  . Smokeless tobacco: Never Used     Comment: 5 cigs day 03/12/13  . Alcohol Use: No   OB History   Grav Para Term Preterm Abortions TAB SAB Ect Mult Living   2 1 1  1   1  1      Review of Systems  Constitutional: Negative for fever, chills, diaphoresis, appetite change and fatigue.  HENT: Negative for  mouth sores, sore throat and trouble swallowing.   Eyes: Negative for visual disturbance.  Respiratory: Negative for cough, chest tightness, shortness of breath and wheezing.   Cardiovascular: Negative for chest pain.  Gastrointestinal: Negative for nausea, vomiting, abdominal pain,  diarrhea and abdominal distention.  Endocrine: Negative for polydipsia, polyphagia and polyuria.  Genitourinary: Negative for dysuria, frequency and hematuria.  Musculoskeletal: Positive for back pain and neck pain. Negative for gait problem.       Right hip pain.  Skin: Negative for color change, pallor and rash.  Neurological: Positive for headaches. Negative for dizziness, syncope and light-headedness.  Hematological: Does not bruise/bleed easily.  Psychiatric/Behavioral: Negative for behavioral problems and confusion.      Allergies  Codeine; Cyclobenzaprine; Darvocet; Abilify; Ace inhibitors; Adhesive; Gabapentin; Iodine; Levofloxacin; Prednisone; Sulfa antibiotics; and Robaxin  Home Medications   Prior to Admission medications   Medication Sig Start Date End Date Taking? Authorizing Provider  albuterol (PROVENTIL HFA;VENTOLIN HFA) 108 (90 BASE) MCG/ACT inhaler Inhale 2 puffs into the lungs 3 (three) times daily as needed for wheezing or shortness of breath.   Yes Historical Provider, MD  albuterol (PROVENTIL) (2.5 MG/3ML) 0.083% nebulizer solution Take 2.5 mg by nebulization every 4 (four) hours as needed for wheezing. 03/16/13  Yes Alycia Rossetti, MD  amitriptyline (ELAVIL) 100 MG tablet Take 100 mg by mouth at bedtime. 05/01/13  Yes Levonne Spiller, MD  baclofen (LIORESAL) 10 MG tablet Take 10 mg by mouth daily.    Yes Historical Provider, MD  clonazePAM (KLONOPIN) 1 MG tablet Take 1 mg by mouth 4 (four) times daily as needed for anxiety. 05/01/13  Yes Levonne Spiller, MD  DULoxetine (CYMBALTA) 60 MG capsule Take 60 mg by mouth every morning. 05/01/13  Yes Levonne Spiller, MD  escitalopram (LEXAPRO) 20 MG tablet  Take 20 mg by mouth every morning. 05/01/13 05/01/14 Yes Levonne Spiller, MD  lidocaine (LIDODERM) 5 % Place 1 patch onto the skin every 12 (twelve) hours. 04/22/13  Yes Historical Provider, MD  metoprolol tartrate (LOPRESSOR) 25 MG tablet Take 1 tablet (25 mg total) by mouth 2 (two) times daily with a meal. 04/13/13  Yes Annita Brod, MD  oxybutynin (DITROPAN XL) 10 MG 24 hr tablet Take 1 tablet (10 mg total) by mouth daily. 02/27/13  Yes Alycia Rossetti, MD  pantoprazole (PROTONIX) 40 MG tablet Take 40 mg by mouth daily. 04/24/13  Yes Kathie Dike, MD  pravastatin (PRAVACHOL) 40 MG tablet Take 1 tablet (40 mg total) by mouth every evening. 02/27/13  Yes Alycia Rossetti, MD  predniSONE (DELTASONE) 2.5 MG tablet Take 7.5 mg by mouth daily with breakfast. 04/24/13  Yes Kathie Dike, MD  tiotropium (SPIRIVA) 18 MCG inhalation capsule Place 18 mcg into inhaler and inhale daily. 02/27/13  Yes Alycia Rossetti, MD  traMADol (ULTRAM) 50 MG tablet Take 50 mg by mouth every 8 (eight) hours as needed for moderate pain.    Yes Historical Provider, MD   BP 127/69  Pulse 69  Temp(Src) 98.3 F (36.8 C) (Oral)  Resp 20  Ht 5\' 7"  (1.702 m)  Wt 194 lb (87.998 kg)  BMI 30.38 kg/m2  SpO2 100% Physical Exam  Constitutional: She is oriented to person, place, and time. She appears well-developed and well-nourished. No distress.  HENT:  Head: Normocephalic.  Eyes: Conjunctivae are normal. Pupils are equal, round, and reactive to light. No scleral icterus.  Neck: No thyromegaly present.    Cardiovascular: Normal rate and regular rhythm.  Exam reveals no gallop and no friction rub.   No murmur heard. Pulmonary/Chest: Effort normal and breath sounds normal. No respiratory distress. She has no wheezes. She has no rales.  Abdominal: Soft. Bowel sounds are normal. She exhibits no distension.  There is no tenderness. There is no rebound.  Musculoskeletal: Normal range of motion.       Back:       Legs: No  shortening of LE.  Neurological: She is alert and oriented to person, place, and time.  Skin: Skin is warm and dry. No rash noted.  Psychiatric: She has a normal mood and affect. Her behavior is normal.    ED Course  Procedures (including critical care time) Labs Review Labs Reviewed - No data to display  Imaging Review Dg Lumbar Spine Complete  05/23/2013   CLINICAL DATA:  Pain post fall  EXAM: LUMBAR SPINE - COMPLETE 4+ VIEW  COMPARISON:  MR 05/06/2013  FINDINGS: Previous fusion L3-S1, hardware intact, alignment preserved. There is narrowing of the L1-2 and L2-3 interspaces. Small anterior endplate spurs V4-0 and L2-3. Negative for fracture. Patchy aortic calcifications without suggestion of aneurysm. Surgical clips right upper abdomen.  IMPRESSION: 1. Negative for fracture or other acute bone abnormality. 2. Degenerative and postop changes as above.   Electronically Signed   By: Arne Cleveland M.D.   On: 05/23/2013 10:09   Dg Hip Complete Right  05/23/2013   CLINICAL DATA:  Fall, hip pain  EXAM: RIGHT HIP - COMPLETE 2+ VIEW  COMPARISON:  None.  FINDINGS: Three views of the right hip submitted. No acute fracture or subluxation. Postsurgical changes are noted lower lumbar spine. Mild degenerative changes bilateral SI joints. Mild degenerative changes pubic symphysis.  IMPRESSION: No acute fracture or subluxation. Postsurgical changes lower lumbar spine.   Electronically Signed   By: Lahoma Crocker M.D.   On: 05/23/2013 10:12   Ct Head Wo Contrast  05/23/2013   CLINICAL DATA:  Head and neck pain after fall.  EXAM: CT HEAD WITHOUT CONTRAST  CT CERVICAL SPINE WITHOUT CONTRAST  TECHNIQUE: Multidetector CT imaging of the head and cervical spine was performed following the standard protocol without intravenous contrast. Multiplanar CT image reconstructions of the cervical spine were also generated.  COMPARISON:  CT scan of April 08, 2013.  FINDINGS: CT HEAD FINDINGS  Bony calvarium appears intact. Mild  diffuse cortical atrophy is noted. No mass effect or midline shift is noted. Ventricular size is within normal limits. There is no evidence of mass lesion, hemorrhage or acute infarction.  CT CERVICAL SPINE FINDINGS  Status post surgical anterior fusion of C6-7. Reversal of normal lordosis which may be positional in origin. No fracture or significant spondylolisthesis is noted.  IMPRESSION: Mild diffuse cortical atrophy. No acute intracranial abnormality seen.  Status post surgical anterior fusion of C6-7. No acute abnormality seen in the cervical spine.   Electronically Signed   By: Sabino Dick M.D.   On: 05/23/2013 09:51   Ct Cervical Spine Wo Contrast  05/23/2013   CLINICAL DATA:  Head and neck pain after fall.  EXAM: CT HEAD WITHOUT CONTRAST  CT CERVICAL SPINE WITHOUT CONTRAST  TECHNIQUE: Multidetector CT imaging of the head and cervical spine was performed following the standard protocol without intravenous contrast. Multiplanar CT image reconstructions of the cervical spine were also generated.  COMPARISON:  CT scan of April 08, 2013.  FINDINGS: CT HEAD FINDINGS  Bony calvarium appears intact. Mild diffuse cortical atrophy is noted. No mass effect or midline shift is noted. Ventricular size is within normal limits. There is no evidence of mass lesion, hemorrhage or acute infarction.  CT CERVICAL SPINE FINDINGS  Status post surgical anterior fusion of C6-7. Reversal of normal lordosis which may be positional  in origin. No fracture or significant spondylolisthesis is noted.  IMPRESSION: Mild diffuse cortical atrophy. No acute intracranial abnormality seen.  Status post surgical anterior fusion of C6-7. No acute abnormality seen in the cervical spine.   Electronically Signed   By: Sabino Dick M.D.   On: 05/23/2013 09:51     EKG Interpretation None      MDM   Final diagnoses:  Multiple contusions    X-ray studies are normal of the lumbar spine and hip. CT scan of the head and neck are normal. No  discharge back to the health hospital. Anti-inflammatories as needed.    Tanna Furry, MD 05/23/13 [place], MD 05/23/13 [place], MD 05/23/13 [phone]

## 2013-05-23 NOTE — [facility] Group Notes (Signed)
[facility] Group Notes:  (Clinical Social Work)  05/23/2013   1:15-2:15PM  Summary of Progress/Problems:   The main focus of today's process group was for the patient to identify ways in which they have sabotaged their own mental health wellness/recovery.  Motivational interviewing and a handout were used to explore the benefits and costs of their self-sabotaging behavior as well as the benefits and costs of changing this behavior.  The Stages of Change were explained to the group using a handout, and patients identified where they are with regard to changing self-defeating behaviors.  The patient expressed she self-sabotages with isolation, but was incongruent with her remarks about whether isolation was good or bad, changes her mind several times within the short time she spoke.  She frequently interrupted other patients with her viewpoint which was inappropriate at those times.  Type of Therapy:  Process Group  Participation Level:  Active  Participation Quality:  Intrusive  Affect:  Flat and Labile  Cognitive:  Disorganized  Insight:  Limited  Engagement in Therapy:  Limited  Modes of Intervention:  Education, Motivational Interviewing   Selmer Dominion, LCSW 05/23/2013, 4:00pm

## 2013-05-23 NOTE — ED Notes (Addendum)
Pt repeatedly coming out of room, asking for coffee, discharge paperwork, etc. Pt advised that it will be brought as soon as possible and asked to please stay in room. Pt goes back to room only to come out shortly after and requests coffee and discharge papers again.

## 2013-05-23 NOTE — Progress Notes (Signed)
Patient ID: Tonya Sullivan, female   DOB: Mar 29, 1955, 58 y.o.   MRN: [id number] D)  Was upset at the beginning of the shift, thought she had the wrong code number that she had given her family, but was verified to be correct.  Was upset, thinking they may have tried to call her and weren't able to get through.  Tried to call her sisters, and her mother, but no one was picking up, left messages for her family.  Then began feeling they were intentionally not answering.  Reminded her that it was the beginning of a holiday weekend and she became more calm, but had to be reminded several times of things we discussed.  Had requested percocet, but shows allergy risk.  Asked several times for percocet and had to be reminded of the reason it wasn't ordered.  Seems to process poorly at times, and needs frequent reassurance.  Didn't attend AA meeting tonight, became upset about dinner and said she hadn't eaten but the board shows she went to the cafeteria, so tray was heated for her and she had dinner again while group was in session.  Has poor short term memory,becomes agitated easily but is redirectable.  Order was obtained for lidoderm patches for continued c/o back discomfort, has been wearing her back brace, and to use wheelchair as needed.   A)  Was assisted into bed, heat packs to back and hips, and pillows for support.  Will continue to monitor for safety, continue POC R)  Safety maintained.

## 2013-05-23 NOTE — Progress Notes (Signed)
D   Received pt from ED in stable condition   Pt was moved to the 500 hall to be closer to the nurses station and it is a handicap room  Pt also has a wheelchair   Pt continues to request pain medication and other medications even though she has already had her medications   Unsure if she doesn't remember taking medications or she is med seeking   Pt has pressured speech and is intrusive A   Verbal support given  Provide redirection when needed  Medications administered and effectivness monitored   Q 15 min checks  Will continue to enforce high risk fall precautions with pt and monitor compliance R   Pt safe at present and verbalizes understanding

## 2013-05-23 NOTE — Progress Notes (Signed)
At approximately 0725 Pt was found in the medication room on the floor by staff   She said she fell on the floor and hit her head  Fall was unwitnesed   Vitals  128/75 p 73   Pt is alert and oriented   She said she went to sit in the chair and the chair wasn't there   Notified NP Mickel Baas and received order to send to [facility] or [facility] for further evaluation   Pt was transported via EMS

## 2013-05-23 NOTE — Progress Notes (Signed)
[facility] MD Progress Note  05/23/2013 8:12 PM Tonya Sullivan  MRN:  [id number] Subjective:  State she would like to have some opioids for her pain. States that she feels she is back to being herself. States that she thinks that a lot of what happened had to do with her multiple surgeries. States that she hopes that the relationship with her son and his Gf gets better as she wants to see her grand kid Diagnosis:   DSM5: Schizophrenia Disorders:  Denies Obsessive-Compulsive Disorders:  Denies Trauma-Stressor Disorders:  Posttraumatic Stress Disorder (309.81) Substance/Addictive Disorders:  none Depressive Disorders:  Major Depressive Disorder - Severe (296.23) Total Time spent with patient: 30 minutes  Axis I: Mood Disorder NOS  ADL's:  Intact  Sleep: Fair  Appetite:  Fair  Suicidal Ideation:  Plan:  denies Intent:  denies Means:  denies Homicidal Ideation:  Plan:  denies Intent:  denies Means:  denies AEB (as evidenced by):  Psychiatric Specialty Exam: Physical Exam  Review of Systems  Constitutional: Positive for malaise/fatigue.  HENT: Negative.   Eyes: Negative.   Respiratory: Negative.   Cardiovascular: Negative.   Gastrointestinal: Negative.   Genitourinary: Negative.   Musculoskeletal: Positive for back pain.  Skin: Negative.   Neurological: Positive for weakness.  Endo/Heme/Allergies: Negative.   Psychiatric/Behavioral: Positive for depression. The patient is nervous/anxious and has insomnia.     Blood pressure 134/89, pulse 70, temperature 98.3 F (36.8 C), temperature source Oral, resp. rate 18, height 5\' 7"  (1.702 m), weight 87.998 kg (194 lb), SpO2 97.00%.Body mass index is 30.38 kg/(m^2).  General Appearance: Fairly Groomed  Engineer, water::  Fair  Speech:  Clear and Coherent and rapid  Volume:  fluctuates  Mood:  Anxious and worried  Affect:  anxious, worried  Thought Process:  Coherent and Goal Directed  Orientation:  Full (Time, Place, and Person)   Thought Content:  symptoms, worries, concerns  Suicidal Thoughts:  No  Homicidal Thoughts:  No  Memory:  Immediate;   Fair Recent;   Fair Remote;   Fair  Judgement:  Fair  Insight:  Present and Shallow  Psychomotor Activity:  Restlessness  Concentration:  Fair  Recall:  [employer] of Knowledge:NA  Language: Fair  Akathisia:  No  Handed:    AIMS (if indicated):     Assets:  Desire for Improvement Housing  Sleep:  Number of Hours: 5.75   Musculoskeletal: Strength & Muscle Tone: decreased Gait & Station: unsteady Patient leans: N/A  Current Medications: Current Facility-Administered Medications  Medication Dose Route Frequency Provider Last Rate Last Dose  . albuterol (PROVENTIL HFA;VENTOLIN HFA) 108 (90 BASE) MCG/ACT inhaler 2 puff  2 puff Inhalation TID PRN Lurena Nida, NP      . alum & mag hydroxide-simeth (MAALOX/MYLANTA) 200-200-20 MG/5ML suspension 30 mL  30 mL Oral Q4H PRN Lurena Nida, NP      . amitriptyline (ELAVIL) tablet 100 mg  100 mg Oral QHS Lurena Nida, NP   100 mg at 05/22/13 2038  . baclofen (LIORESAL) tablet 5 mg  5 mg Oral BID Lurena Nida, NP   5 mg at 05/23/13 1714  . clonazePAM (KLONOPIN) tablet 0.5 mg  0.5 mg Oral TID PRN Nicholaus Bloom, MD   0.5 mg at 05/23/13 1304  . DULoxetine (CYMBALTA) DR capsule 60 mg  60 mg Oral q morning - 10a Lurena Nida, NP   60 mg at 05/23/13 1148  . lidocaine (LIDODERM) 5 % 3 patch  3 patch Transdermal Q24H Benjamine Mola, FNP   3 patch at 05/23/13 1955  . magnesium hydroxide (MILK OF MAGNESIA) suspension 30 mL  30 mL Oral Daily PRN Lurena Nida, NP      . metoprolol tartrate (LOPRESSOR) tablet 25 mg  25 mg Oral BID WC Lurena Nida, NP   25 mg at 05/23/13 1714  . oxybutynin (DITROPAN-XL) 24 hr tablet 10 mg  10 mg Oral Daily Lurena Nida, NP   10 mg at 05/23/13 1147  . pantoprazole (PROTONIX) EC tablet 40 mg  40 mg Oral Daily Lurena Nida, NP   40 mg at 05/23/13 1147  . predniSONE (DELTASONE) tablet 7.5 mg  7.5 mg  Oral Q breakfast Lurena Nida, NP   7.5 mg at 05/23/13 2979  . simvastatin (ZOCOR) tablet 20 mg  20 mg Oral q1800 Lurena Nida, NP   20 mg at 05/23/13 1715  . tiotropium (SPIRIVA) inhalation capsule 18 mcg  18 mcg Inhalation Daily Lurena Nida, NP   18 mcg at 05/23/13 1147  . traMADol (ULTRAM) tablet 50 mg  50 mg Oral Q8H PRN Lurena Nida, NP   50 mg at 05/23/13 1943    Lab Results: No results found for this or any previous visit (from the past 48 hour(s)).  Physical Findings: AIMS: Facial and Oral Movements Muscles of Facial Expression: None, normal Lips and Perioral Area: None, normal Jaw: None, normal Tongue: None, normal,Extremity Movements Upper (arms, wrists, hands, fingers): None, normal Lower (legs, knees, ankles, toes): None, normal, Trunk Movements Neck, shoulders, hips: None, normal, Overall Severity Severity of abnormal movements (highest score from questions above): None, normal Incapacitation due to abnormal movements: None, normal Patient's awareness of abnormal movements (rate only patient's report): No Awareness, Dental Status Current problems with teeth and/or dentures?: No Does patient usually wear dentures?: No  CIWA:    COWS:     Treatment Plan Summary: Daily contact with patient to assess and evaluate symptoms and progress in treatment Medication management  Plan: Supportive approach/coping skills           CBT/mindufleness           Oxycodone 5 mg Q 8  Medical Decision Making Problem Points:  Established problem, stable/improving (1), Established problem, worsening (2) and Review of psycho-social stressors (1) Data Points:  Review of medication regiment & side effects (2) Review of new medications or change in dosage (2)  I certify that inpatient services furnished can reasonably be expected to improve the patient's condition.   Nicholaus Bloom 05/23/2013, 8:12 PM

## 2013-05-23 NOTE — Progress Notes (Signed)
Pt has been intrusive and attention seeking   She is also trying to staff split    She went to another nurse and got medication then came to me and told that the other nurse said she should come to me for medication so pt received medication again   Pt received medication within the parameters of orders  She continues to manipulate to try to get medications and when told she already had the med she blames her asking again on her poor memory  Will continue to redirect pt and set limits

## 2013-05-24 MED ORDER — ONDANSETRON 4 MG PO TBDP: 4.0000 mg | ORAL_TABLET | Freq: Three times a day (TID) | ORAL | Status: DC | PRN

## 2013-05-24 MED ORDER — BENZOCAINE 10 % MT GEL
Freq: Four times a day (QID) | OROMUCOSAL | Status: DC | PRN
  Administered 2013-05-25 – 2013-05-26 (×3): via OROMUCOSAL
  Filled 2013-05-24: qty 9.4

## 2013-05-24 NOTE — Progress Notes (Signed)
D. Pt has been up and visible in milieu today, did attend and participate in self inventory group however was unable to attend other groups this morning. Pt appears restless and irritable today. Pt has complained of pain and anxiety throughout the morning time today.and has requested and received medications as ordered. Pt has denied any SI today and did endorse feelings of depression. Pt did speak about how she would like to be discharged and feels there is too much negative energy in the place and feels that her needs are not being met. Pt was offered to sign 72 hour request for discharge however declined and said the doctor told her that she could leave tomorrow.  A. Support and encouragement provided, medication education given. R. Pt verbalized understanding, safety maintained, will continue to monitor.

## 2013-05-24 NOTE — [facility] Group Notes (Signed)
[facility] Group Notes:  (Nursing/MHT/Case Management/Adjunct)  Date:  05/24/2013  Time:  12:50 PM  Type of Therapy:  Psychoeducational Skills  Participation Level:  Active  Participation Quality:  Appropriate  Affect:  Appropriate  Cognitive:  Appropriate  Insight:  Appropriate  Engagement in Group:  Engaged  Modes of Intervention:  Discussion  Summary of Progress/Problems: Pt did attend spirituality and self inventory group, pt reported that she was negative SI/HI, no AH/VH noted. Pt rated her depression as a 5, and her helplessness/hopelessness as a 5.      Kunio Cummiskey Shanta Anmarie Fukushima 05/24/2013, 12:50 PM

## 2013-05-24 NOTE — Progress Notes (Signed)
Did not attend group because discussing medications.

## 2013-05-24 NOTE — [facility] Group Notes (Signed)
[facility] Group Notes: (Clinical Social Work)   05/24/2013      Type of Therapy:  Group Therapy   Participation Level:  Did Not Attend    Selmer Dominion, LCSW 05/24/2013, 4:19 PM

## 2013-05-24 NOTE — Progress Notes (Signed)
D.  Pt pleasant on approach, remained in bed until 2200.  Pt states that she will be going home tomorrow and that she and family have plans for [facility].  Pt received scheduled medications as well as her Oxy IR at bedtime.  Pt did not get up for evening wrap up group.  Pt up and in dayroom this evening.  A.  Support and encouragement offered  R.  Pt remains safe on unit, will continue to monitor.

## 2013-05-24 NOTE — Progress Notes (Signed)
Patient ID: Tonya Sullivan, female   DOB: [birth year], 58 y.o.   MRN: [id number] [facility] MD Progress Note  05/24/2013 11:54 AM Tonya Sullivan  MRN:  [id number] Subjective:  Pt seen and chart reviewed. Pt denies SI, HI, and AVH, contracts for safety. Pt reports that she is "ready to go home" and that she spoke to Dr. Sabra Heck about this. Pt reports "I guess I"ll keep asking him about it". Pt states that her pain is controlled on her current medication regimen and that she has been participating in group until she started feeling nauseous this morning. Pt reports good sleep but lower appetite today only.    Diagnosis:   DSM5: Schizophrenia Disorders:  Denies Obsessive-Compulsive Disorders:  Denies Trauma-Stressor Disorders:  Posttraumatic Stress Disorder (309.81) Substance/Addictive Disorders:  none Depressive Disorders:  Major Depressive Disorder - Severe (296.23) Total Time spent with patient: 25 minutes  Axis I: Mood Disorder NOS  ADL's:  Intact  Sleep: Fair  Appetite:  Fair  Suicidal Ideation:  Plan:  denies Intent:  denies Means:  denies Homicidal Ideation:  Plan:  denies Intent:  denies Means:  denies AEB (as evidenced by):  Psychiatric Specialty Exam: Physical Exam  Review of Systems  Constitutional: Positive for malaise/fatigue.  HENT: Negative.   Eyes: Negative.   Respiratory: Negative.   Cardiovascular: Negative.   Gastrointestinal: Negative.   Genitourinary: Negative.   Musculoskeletal: Positive for back pain.  Skin: Negative.   Neurological: Positive for weakness.  Endo/Heme/Allergies: Negative.   Psychiatric/Behavioral: Positive for depression. The patient is nervous/anxious.     Blood pressure 135/82, pulse 80, temperature 97.7 F (36.5 C), temperature source Oral, resp. rate 16, height 5\' 7"  (1.702 m), weight 87.998 kg (194 lb), SpO2 97.00%.Body mass index is 30.38 kg/(m^2).  General Appearance: Fairly Groomed  Engineer, water::  Fair  Speech:  Clear and Coherent  and rapid  Volume:  fluctuates  Mood:  Anxious and worried  Affect:  anxious, worried  Thought Process:  Coherent and Goal Directed  Orientation:  Full (Time, Place, and Person)  Thought Content:  symptoms, worries, concerns  Suicidal Thoughts:  No  Homicidal Thoughts:  No  Memory:  Immediate;   Fair Recent;   Fair Remote;   Fair  Judgement:  Fair  Insight:  Present and Shallow  Psychomotor Activity:  Restlessness  Concentration:  Fair  Recall:  [employer] of Knowledge:NA  Language: Fair  Akathisia:  No  Handed:    AIMS (if indicated):     Assets:  Desire for Improvement Housing  Sleep:  Number of Hours: 2   Musculoskeletal: Strength & Muscle Tone: decreased Gait & Station: unsteady Patient leans: N/A  Current Medications: Current Facility-Administered Medications  Medication Dose Route Frequency Provider Last Rate Last Dose  . albuterol (PROVENTIL HFA;VENTOLIN HFA) 108 (90 BASE) MCG/ACT inhaler 2 puff  2 puff Inhalation TID PRN Lurena Nida, NP      . alum & mag hydroxide-simeth (MAALOX/MYLANTA) 200-200-20 MG/5ML suspension 30 mL  30 mL Oral Q4H PRN Lurena Nida, NP      . amitriptyline (ELAVIL) tablet 100 mg  100 mg Oral QHS Lurena Nida, NP   100 mg at 05/23/13 2116  . baclofen (LIORESAL) tablet 5 mg  5 mg Oral BID Lurena Nida, NP   5 mg at 05/24/13 0746  . clonazePAM (KLONOPIN) tablet 0.5 mg  0.5 mg Oral TID PRN Nicholaus Bloom, MD   0.5 mg at 05/24/13 0955  .  DULoxetine (CYMBALTA) DR capsule 60 mg  60 mg Oral q morning - 10a Lurena Nida, NP   60 mg at 05/24/13 0746  . lidocaine (LIDODERM) 5 % 3 patch  3 patch Transdermal Q24H Benjamine Mola, FNP   3 patch at 05/23/13 1955  . magnesium hydroxide (MILK OF MAGNESIA) suspension 30 mL  30 mL Oral Daily PRN Lurena Nida, NP      . metoprolol tartrate (LOPRESSOR) tablet 25 mg  25 mg Oral BID WC Lurena Nida, NP   25 mg at 05/24/13 0745  . oxybutynin (DITROPAN-XL) 24 hr tablet 10 mg  10 mg Oral Daily Lurena Nida, NP    10 mg at 05/24/13 0745  . oxyCODONE (Oxy IR/ROXICODONE) immediate release tablet 5 mg  5 mg Oral Q8H PRN Nicholaus Bloom, MD   5 mg at 05/24/13 0636  . pantoprazole (PROTONIX) EC tablet 40 mg  40 mg Oral Daily Lurena Nida, NP   40 mg at 05/24/13 0745  . predniSONE (DELTASONE) tablet 7.5 mg  7.5 mg Oral Q breakfast Lurena Nida, NP   7.5 mg at 05/24/13 0745  . simvastatin (ZOCOR) tablet 20 mg  20 mg Oral q1800 Lurena Nida, NP   20 mg at 05/23/13 1715  . tiotropium (SPIRIVA) inhalation capsule 18 mcg  18 mcg Inhalation Daily Lurena Nida, NP   18 mcg at 05/24/13 0751  . traMADol (ULTRAM) tablet 50 mg  50 mg Oral Q8H PRN Lurena Nida, NP   50 mg at 05/24/13 0865    Lab Results: No results found for this or any previous visit (from the past 48 hour(s)).  Physical Findings: AIMS: Facial and Oral Movements Muscles of Facial Expression: None, normal Lips and Perioral Area: None, normal Jaw: None, normal Tongue: None, normal,Extremity Movements Upper (arms, wrists, hands, fingers): None, normal Lower (legs, knees, ankles, toes): None, normal, Trunk Movements Neck, shoulders, hips: None, normal, Overall Severity Severity of abnormal movements (highest score from questions above): None, normal Incapacitation due to abnormal movements: None, normal Patient's awareness of abnormal movements (rate only patient's report): No Awareness, Dental Status Current problems with teeth and/or dentures?: No Does patient usually wear dentures?: No  CIWA:    COWS:     Treatment Plan Summary: Daily contact with patient to assess and evaluate symptoms and progress in treatment Medication management  Plan: Supportive approach/coping skills           CBT/mindufleness           Oxycodone 5 mg Q 8           Zofran 4mg  q8h PRN nausea  Medical Decision Making Problem Points:  Established problem, stable/improving (1), Review of last therapy session (1) and Review of psycho-social stressors (1) Data Points:   Review of medication regiment & side effects (2) Review of new medications or change in dosage (2)  I certify that inpatient services furnished can reasonably be expected to improve the patient's condition.   Benjamine Mola, FNP-BC 05/24/2013, 11:54 AM Agree with assessment and plan Geralyn Flash A. Sabra Heck, M.D.

## 2013-05-24 NOTE — Progress Notes (Signed)
Writer spoke with and medicated patient at the beginning of my shift. She came to nursing station before I got out of report yelling and cursing to another nurse reporting that she was getting the run around and she had not received her 4 pm medications. Writer spoke with patient and informed her of prn medications available and 2200 medications due. Writer then went to her room and placed her 3 lidoderm patches on her left,right hip and lower back and she received 50 mg of ultram. Patient c/o staff from day shift while patches were being placed.  She complained about her care since being here. Writer encouraged her to make sure she fills this out on her survey once discharged. Patient did not attend group but rather sat outside dayroom and once group was over sat at nursing station demanding to speak to charge nurse. She has been very needy, intrusive, demanding, manipulative and is focused on nothing but her pain medications and klonopin. She spoke with [facility] (K. [facility]ard) and addressed her issues, complaints and concerns. She reported to [facility] that Probation officer cursed her while in her room and Probation officer informed her that this was untrue and for the remainder of the shift if needing to speak to her in her room or provide/assist with personal care,  Probation officer will have another staff member present. Patient safety maintained on unit.

## 2013-05-24 NOTE — [facility] Group Notes (Signed)
[facility] Group Notes:  (Nursing/MHT/Case Management/Adjunct)  Date:  05/24/2013  Time:  1:31 PM  Type of Therapy:  Psychoeducational Skills  Participation Level:  Did Not Attend  Tonya Sullivan Danna Sewell 05/24/2013, 1:31 PM

## 2013-05-25 MED ORDER — QUETIAPINE FUMARATE 100 MG PO TABS
100.0000 mg | ORAL_TABLET | Freq: Two times a day (BID) | ORAL | Status: DC
  Administered 2013-05-25: 100 mg via ORAL
  Filled 2013-05-25 (×5): qty 1

## 2013-05-25 MED ORDER — CLONAZEPAM 1 MG PO TABS
1.0000 mg | ORAL_TABLET | Freq: Two times a day (BID) | ORAL | Status: DC
  Administered 2013-05-26: 1 mg via ORAL
  Filled 2013-05-25: qty 1

## 2013-05-25 MED ORDER — LIDOCAINE 5 % EX PTCH
1.0000 | MEDICATED_PATCH | Freq: Every day | CUTANEOUS | Status: DC
  Administered 2013-05-26: 1 via TRANSDERMAL
  Filled 2013-05-25 (×3): qty 1

## 2013-05-25 NOTE — Progress Notes (Signed)
LCSW has met with patient twice to explain that patient does not have orders to discharge home this afternoon and there is no appointments for aftercare. Patient is adamant that MD told her she would leave today, however there is no documentation reflecting her discharge today.  LCSW followed up with RN and made her aware of discussion and she has been explaining the same thing to patient.    Patient has been yelling up and down the halls asking for an order to DC.  Again there is nothing in place to ensure a safe DC and this has not been communicated among the treatment team that she would be discharged.  Patient encouraged to participate in programming and discharge would be reviewed again tomorrow 5/26.    Caleen Essex, MSW, [place] Clinical Lead [phone]

## 2013-05-25 NOTE — Progress Notes (Signed)
Patient ID: Tonya Sullivan, female   DOB: 1955-03-28, 58 y.o.   MRN: [id number] [facility] MD Progress Note  05/25/2013 12:16 PM EARNEST THALMAN  MRN:  [id number] Subjective:  Met with the patient today who insists on being discharged. She reports that a staff MD told her that she would be discharged home today but the chart has no indication of that. She is insistent that Dr. Sabra Heck or Dr. Harrington Challenger be called at home. She gives information and then immediately contradicts her self. Her speech is circumstantial and tedious. She is also pressured and loud, with irritable demanding behavior. She denies taking an overdose stating that it's not "her fault." She shows no insight into her illness and poor judgement.  Diagnosis:   DSM5: Schizophrenia Disorders:  Denies Obsessive-Compulsive Disorders:  Denies Trauma-Stressor Disorders:  Posttraumatic Stress Disorder (309.81) Substance/Addictive Disorders:  none Depressive Disorders:  Major Depressive Disorder - Severe (296.23) Total Time spent with patient: 25 minutes  Axis I: Mood Disorder NOS  ADL's:  Intact  Sleep: Fair  Appetite:  Fair  Suicidal Ideation:  Plan:  denies Intent:  denies Means:  denies Homicidal Ideation:  Plan:  denies Intent:  denies Means:  denies AEB (as evidenced by):  Psychiatric Specialty Exam: Physical Exam  Review of Systems  Constitutional: Positive for malaise/fatigue.  HENT: Negative.   Eyes: Negative.   Respiratory: Negative.   Cardiovascular: Negative.   Gastrointestinal: Negative.   Genitourinary: Negative.   Musculoskeletal: Positive for back pain.  Skin: Negative.   Neurological: Positive for weakness.  Endo/Heme/Allergies: Negative.   Psychiatric/Behavioral: Positive for depression. The patient is nervous/anxious.     Blood pressure 136/84, pulse 69, temperature 98 F (36.7 C), temperature source Oral, resp. rate 16, height _0  (1.702 m), weight 87.998 kg (194 lb), SpO2 97.00%.Body mass index is 30.38  kg/(m^2).  General Appearance: Fairly Groomed  Engineer, water::  Fair  Speech:  Pressured, loud, circumstantial  Volume:  fluctuates  Mood:  Irritable, demanding, entitled  Affect:  anxious, worried  Thought Process:  inconsistent  Orientation:  Full (Time, Place, and Person)  Thought Content:  symptoms, worries, concerns  Suicidal Thoughts:  No  Homicidal Thoughts:  No  Memory:  Immediate;   Fair Recent;   Fair Remote;   Fair  Judgement:  Fair  Insight:  Present and Shallow  Psychomotor Activity:  Restlessness  Concentration:  Fair  Recall:  [employer] of Knowledge:NA  Language: Fair  Akathisia:  No  Handed:    AIMS (if indicated):     Assets:  Desire for Improvement Housing  Sleep:  Number of Hours: 5.75   Musculoskeletal: Strength & Muscle Tone: decreased Gait & Station: unsteady Patient leans: N/A  Current Medications: Current Facility-Administered Medications  Medication Dose Route Frequency Provider Last Rate Last Dose  . albuterol (PROVENTIL HFA;VENTOLIN HFA) 108 (90 BASE) MCG/ACT inhaler 2 puff  2 puff Inhalation TID PRN Lurena Nida, NP      . alum & mag hydroxide-simeth (MAALOX/MYLANTA) 200-200-20 MG/5ML suspension 30 mL  30 mL Oral Q4H PRN Lurena Nida, NP      . amitriptyline (ELAVIL) tablet 100 mg  100 mg Oral QHS Lurena Nida, NP   100 mg at 05/24/13 2208  . baclofen (LIORESAL) tablet 5 mg  5 mg Oral BID Lurena Nida, NP   5 mg at 05/25/13 9628  . benzocaine (ORAJEL) 10 % mucosal gel   Mouth/Throat QID PRN Lurena Nida, NP      .  clonazePAM (KLONOPIN) tablet 0.5 mg  0.5 mg Oral TID PRN Nicholaus Bloom, MD   0.5 mg at 05/25/13 0427  . DULoxetine (CYMBALTA) DR capsule 60 mg  60 mg Oral q morning - 10a Lurena Nida, NP   60 mg at 05/25/13 0943  . [START ON 05/26/2013] lidocaine (LIDODERM) 5 % 1 patch  1 patch Transdermal Daily Nena Polio, PA-C      . magnesium hydroxide (MILK OF MAGNESIA) suspension 30 mL  30 mL Oral Daily PRN Lurena Nida, NP      .  metoprolol tartrate (LOPRESSOR) tablet 25 mg  25 mg Oral BID WC Lurena Nida, NP   25 mg at 05/25/13 8250  . ondansetron (ZOFRAN-ODT) disintegrating tablet 4 mg  4 mg Oral Q8H PRN Benjamine Mola, FNP      . oxybutynin (DITROPAN-XL) 24 hr tablet 10 mg  10 mg Oral Daily Lurena Nida, NP   10 mg at 05/25/13 5397  . pantoprazole (PROTONIX) EC tablet 40 mg  40 mg Oral Daily Lurena Nida, NP   40 mg at 05/25/13 6734  . predniSONE (DELTASONE) tablet 7.5 mg  7.5 mg Oral Q breakfast Lurena Nida, NP   7.5 mg at 05/25/13 1937  . simvastatin (ZOCOR) tablet 20 mg  20 mg Oral q1800 Lurena Nida, NP   20 mg at 05/24/13 1712  . tiotropium (SPIRIVA) inhalation capsule 18 mcg  18 mcg Inhalation Daily Lurena Nida, NP   18 mcg at 05/25/13 0810  . traMADol (ULTRAM) tablet 50 mg  50 mg Oral Q8H PRN Lurena Nida, NP   50 mg at 05/25/13 1000    Lab Results: No results found for this or any previous visit (from the past 48 hour(s)).  Physical Findings: AIMS: Facial and Oral Movements Muscles of Facial Expression: None, normal Lips and Perioral Area: None, normal Jaw: None, normal Tongue: None, normal,Extremity Movements Upper (arms, wrists, hands, fingers): None, normal Lower (legs, knees, ankles, toes): None, normal, Trunk Movements Neck, shoulders, hips: None, normal, Overall Severity Severity of abnormal movements (highest score from questions above): None, normal Incapacitation due to abnormal movements: None, normal Patient's awareness of abnormal movements (rate only patient's report): No Awareness, Dental Status Current problems with teeth and/or dentures?: No Does patient usually wear dentures?: No  CIWA:  CIWA-Ar Total: 4 COWS:  COWS Total Score: 2  Treatment Plan Summary: Daily contact with patient to assess and evaluate symptoms and progress in treatment Medication management  Plan: 1. After discussion with Dr. Louretta Shorten, Klonopin will be changed to 1.mg po BID. 2. Start Seroquel  179m po BID started for mania. 3. Will attempt to gather collateral.   Medical Decision Making Problem Points:  Established problem, stable/improving (1), Review of last therapy session (1) and Review of psycho-social stressors (1) Data Points:  Review of medication regiment & side effects (2) Review of new medications or change in dosage (2)  I certify that inpatient services furnished can reasonably be expected to improve the patient's condition.   NMarlane Hatcher Mashburn RPAC 2:33 PM 05/25/2013  Patient was seen face-to-face for psychiatric evaluation, suicide risk assessment and case discussed with her case manager and physician extender, formulated treatment plan and reviewed the information documented and agree with the treatment plan.  JParke SimmersJonnalagadda 05/25/2013 3:02 PM

## 2013-05-25 NOTE — Progress Notes (Addendum)
Patient stated she is having  memory problems at home.  Does not remember taking her morning medications.  Does not understand why her oxy was discontinued.  Pt took ultra this morning for back pain #10.  Stated she took LSD, many drugs in the past.  Has had 4 surgeries in the past 6 months, reaction to anesthetic.  Overdosed on pills last week,  was told she talked about God.  Family in [place].  Was given saboxon which is like taking LSD.  Has many medical problems, Addison's disease, has been in coma in the past.   Patient could not remember that she was just given klonipin one hour ago.   D:  Patient's self inventory sheet, patient has fair sleep, good appetite, low energy level, improving attention span.  Rated depression 7, denied hopeless and anxiety.  Denied withdrawals.  Denied SI.  Has experienced back pain #10 in past 24 hours.  Plans to discharge home.  Needs financial assistance with medications.  Does not know if she has nail fungus? A:  Medications administered per MD orders.  Emotional support and encouragement given patient. R:  Denied SI and HI.  Denied A/V hallucinations.  Will continue to monitor patient for safety with 15 minute checks.  Safety maintained.  1255   Patient refused to go to dining room for lunch.  Stated she still has not seen a doctor since she was here.  Stated she wants to go home.  Has not been seen by nobody.  Staff continues to reassure and comfort patient with her negativity.    1900  Patient continues to ask about her medications, when they are due again, cannot remember right after taking medications.  Has repeatedly stated throughout day that she needs to talk to MD, SW, etc.  Patient stated this afternoon that her urine smells, does not burn when she urinates.  Will continue to monitor patient for safety with 15 minute checks.  Safety maintained.

## 2013-05-25 NOTE — Progress Notes (Signed)
Adult Psychoeducational Group Note  Date:  05/25/2013 Time:  9:04 PM  Group Topic/Focus:  Wrap-Up Group:   The focus of this group is to help patients review their daily goal of treatment and discuss progress on daily workbooks.  Participation Level:  Did Not Attend   Additional Comments:  Pt did not attend wrap-up group. When prompted she declined to want to attend  Shaye Elling 05/25/2013, 9:04 PM

## 2013-05-26 DIAGNOSIS — F3160 Bipolar disorder, current episode mixed, unspecified: Secondary | ICD-10-CM

## 2013-05-26 MED ORDER — BACLOFEN 10 MG PO TABS: 10.0000 mg | ORAL_TABLET | Freq: Every day | ORAL | Status: DC

## 2013-05-26 MED ORDER — PREDNISONE 2.5 MG PO TABS: 7.5000 mg | ORAL_TABLET | Freq: Every day | ORAL | Status: AC

## 2013-05-26 MED ORDER — DIVALPROEX SODIUM ER 500 MG PO TB24: 500.0000 mg | ORAL_TABLET | Freq: Every day | ORAL | Status: DC

## 2013-05-26 MED ORDER — METOPROLOL TARTRATE 25 MG PO TABS: 25.0000 mg | ORAL_TABLET | Freq: Two times a day (BID) | ORAL | Status: DC

## 2013-05-26 MED ORDER — ALBUTEROL SULFATE HFA 108 (90 BASE) MCG/ACT IN AERS: 2.0000 | INHALATION_SPRAY | Freq: Three times a day (TID) | RESPIRATORY_TRACT | Status: DC | PRN

## 2013-05-26 MED ORDER — DULOXETINE HCL 60 MG PO CPEP: 60.0000 mg | ORAL_CAPSULE | Freq: Every morning | ORAL | Status: DC

## 2013-05-26 MED ORDER — CLONAZEPAM 1 MG PO TABS: 1.0000 mg | ORAL_TABLET | Freq: Two times a day (BID) | ORAL | Status: DC

## 2013-05-26 MED ORDER — OXYBUTYNIN CHLORIDE ER 10 MG PO TB24: 10.0000 mg | ORAL_TABLET | Freq: Every day | ORAL | Status: DC

## 2013-05-26 MED ORDER — ALBUTEROL SULFATE (2.5 MG/3ML) 0.083% IN NEBU: 2.5000 mg | INHALATION_SOLUTION | RESPIRATORY_TRACT | Status: DC | PRN

## 2013-05-26 MED ORDER — AMITRIPTYLINE HCL 100 MG PO TABS: 100.0000 mg | ORAL_TABLET | Freq: Every day | ORAL | Status: DC

## 2013-05-26 MED ORDER — PANTOPRAZOLE SODIUM 40 MG PO TBEC: 40.0000 mg | DELAYED_RELEASE_TABLET | Freq: Every day | ORAL | Status: DC

## 2013-05-26 MED ORDER — TIOTROPIUM BROMIDE MONOHYDRATE 18 MCG IN CAPS: 18.0000 ug | ORAL_CAPSULE | Freq: Every day | RESPIRATORY_TRACT | Status: DC

## 2013-05-26 MED ORDER — DIVALPROEX SODIUM ER 500 MG PO TB24
500.0000 mg | ORAL_TABLET | Freq: Every day | ORAL | Status: DC
  Administered 2013-05-26: 500 mg via ORAL
  Filled 2013-05-26 (×2): qty 1
  Filled 2013-05-26: qty 3
  Filled 2013-05-26: qty 1

## 2013-05-26 MED ORDER — TRAMADOL HCL 50 MG PO TABS: 50.0000 mg | ORAL_TABLET | Freq: Three times a day (TID) | ORAL | Status: DC | PRN

## 2013-05-26 MED ORDER — PRAVASTATIN SODIUM 40 MG PO TABS: 40.0000 mg | ORAL_TABLET | Freq: Every evening | ORAL | Status: DC

## 2013-05-26 MED ORDER — LIDOCAINE 5 % EX PTCH: 1.0000 | MEDICATED_PATCH | Freq: Two times a day (BID) | CUTANEOUS | Status: DC

## 2013-05-26 NOTE — [facility] Suicide Risk Assessment (Signed)
Demographic Factors:  Adolescent or young adult, Caucasian, Low socioeconomic status, Living alone and Unemployed  Total Time spent with patient: 30 minutes  Psychiatric Specialty Exam: Physical Exam  ROS  Blood pressure 110/79, pulse 77, temperature 98.1 F (36.7 C), temperature source Oral, resp. rate 16, height 5\' 7"  (1.702 m), weight 87.998 kg (194 lb), SpO2 97.00%.Body mass index is 30.38 kg/(m^2).  General Appearance: Casual  Eye Contact::  Good  Speech:  Clear and Coherent  Volume:  Normal  Mood:  Anxious and Depressed  Affect:  Appropriate and Congruent  Thought Process:  Coherent and Goal Directed  Orientation:  Full (Time, Place, and Person)  Thought Content:  WDL  Suicidal Thoughts:  No  Homicidal Thoughts:  No  Memory:  Immediate;   Fair  Judgement:  Fair  Insight:  Fair  Psychomotor Activity:  Normal  Concentration:  Fair  Recall:  [employer] of Knowledge:Good  Language: Good  Akathisia:  NA  Handed:  Right  AIMS (if indicated):     Assets:  Communication Skills Desire for Improvement Financial Resources/Insurance Housing Leisure Time Resilience Social Support Talents/Skills  Sleep:  Number of Hours: 3.75    Musculoskeletal: Strength & Muscle Tone: within normal limits Gait & Station: normal Patient leans: N/A   Mental Status Per Nursing Assessment::   On Admission:     Current Mental Status by Physician: NA  Loss Factors: NA  Historical Factors: NA  Risk Reduction Factors:   Sense of responsibility to family, Religious beliefs about death, Positive social support, Positive therapeutic relationship and Positive coping skills or problem solving skills  Continued Clinical Symptoms:  Bipolar Disorder:   Depressive phase Chronic Pain Previous Psychiatric Diagnoses and Treatments Medical Diagnoses and Treatments/Surgeries  Cognitive Features That Contribute To Risk:  Polarized thinking    Suicide Risk:  Minimal: No identifiable  suicidal ideation.  Patients presenting with no risk factors but with morbid ruminations; may be classified as minimal risk based on the severity of the depressive symptoms  Discharge Diagnoses:   AXIS I:  Bipolar, mixed AXIS II:  Cluster B Traits AXIS III:   Past Medical History  Diagnosis Date  . COPD (chronic obstructive pulmonary disease)   . Depression   . Chronic back pain     DDD, disc bulge, radiculopathy, spinal stenosis  . Hyperlipidemia   . Anxiety   . Bipolar disorder, unspecified   . Carotid artery calcification   . DDD (degenerative disc disease), lumbosacral   . Spinal stenosis, lumbar   . Thyroid disease   . Autoimmune thyroiditis   . Hypertension   . TIA (transient ischemic attack)     august 2014  . GERD (gastroesophageal reflux disease)   . H/O dizziness   . Neuropathic pain   . Adrenal gland dysfunction     Addison's disease ON DAILY PREDNISONE  . Addison's disease   . Stroke     HX OF MINI STROKE AUG 2014- DROPPIN EVERYTHING, SPEECH SLURRED, MEMORY AFFECTED --ALL RESOLVED - NO DEFICITS NOW  . Pneumonia     LAST PNEUMONIA WAS AUG 2014  . Achalasia   . Mouth ulcers   . Dysrhythmia     palpitations  . Melanoma     removed 1 week ago at Dr Teryl Lucy office  . Addison's disease    AXIS IV:  economic problems, other psychosocial or environmental problems, problems related to social environment and problems with primary support group AXIS V:  51-60 moderate symptoms  Plan Of Care/Follow-up recommendations:  Activity:  As tolerated Diet:  Regular  Is patient on multiple antipsychotic therapies at discharge:  No   Has Patient had three or more failed trials of antipsychotic monotherapy by history:  No  Recommended Plan for Multiple Antipsychotic Therapies: NA    Janardhaha R Makenzi Bannister 05/26/2013, 10:21 AM

## 2013-05-26 NOTE — B[facility] Group Notes (Signed)
[place] LCSW Group Therapy  05/26/2013 1:35 PM  Type of Therapy:  Group Therapy  Participation Level:  Did Not Attend-pt chose not to come to afternoon group.  Tonya Sullivan LCSWA 05/26/2013, 1:35 PM

## 2013-05-26 NOTE — Progress Notes (Signed)
Discharge Note:  Patient continued to ask many times about discharge information, stated her elderly mother did not need to be sitting in car in parking lot waiting for her.  Patient would leave medication window and then return to window few minutes later asking again.   Patient's discharge information was reviewed with her, signed by patient.  Patient was taken to admission room, items retrieved from locker, patient stated she had brought in a big bag of medicines from home which were not in her locker.  AC informed and AC looked in all areas, including safe, looked again in medication room, no large bag of medicines were found that were brought from home.   Prescriptions received and patient was taken to lobby to wait for her elderly mother.   Take home medications were obtained from pharmacist and given to patient in lobby.  Nurse walked in parking lot looking for patient's mother's car but did not see her mother's car.  Patient called mom.  Patient stated she could not remember about her medicines and stated she will check her home for medications that she thought she brought to hospital.  Patient apologized to nurse.  Suicide prevention information reviewed and given to patient.  Patient stated she did receive all her belongings.  Patient stated she appreciated all assistance received from [facility] staff.

## 2013-05-26 NOTE — Progress Notes (Addendum)
Patient refused seroquel this morning.  Stated she did not care what the MD said about this med after talking to him yesterday.    D:  Patient's self inventory sheet, patient has poor sleep, poor appetite, normal energy level, improving attention span.  Rated depression and hopeless #7.  Has experienced cravings in past 24 hours.  Has experienced pain, blurred vision in past 24 hours.  Pain goal 4, worst pain 6.  Plans to go to meetings after discharge.  Plans to be more sensitive with client, notice with client.  Does have discharge plans.  No problems with meds after discharge. A;  Patient refused seroquel this morning.  Emotional support and encouragement given patient. R:  Denied SI and HI, contracts for safety.  Will continue to monitor patient for safety with 15 minute checks.  Safety maintained.

## 2013-05-26 NOTE — Progress Notes (Signed)
D: Pt is flat in affect and depressed in mood. Pt endorses suicidal ideation. Pt verbally Teacher, English as a foreign language for safety. However, pt refuses to disclose any specific plans. Around 2015 this pt demanded for her 2200 to be administered. Writer informed pt of the 1hr policy. Pt abruptly demanded to speak to the charge RN who happened to be in close proximity. Charge RN reiterated the policy and the importance of staff adhering to this rule. Pt upset that her Klonopin is not scheduled for QID. Pt is hopeless and expressing no motivation to live. She is depressed over the inability to have a relationship with her grandchildren ( due to their parents).  A: Writer made a reasonably effort to administer this pt's med as early as possible. Writer encourage pt to discuss medication concerns with her provider on 05/26/13. Continued support and availability as needed was extended to this pt. Staff continue to monitor pt with q35min checks.  R: No adverse drug reactions noted. Pt receptive to treatment. Pt remains safe at this time.

## 2013-05-26 NOTE — Progress Notes (Signed)
Writer offered pt to sign 72 hr request for discharge. Pt declined. Pt did not want her request to be present in her chart files. Writer informed pt of the legal standings of the form. Pt informed of her right rescind.

## 2013-05-26 NOTE — B[facility] Suicide Risk Assessment (Signed)
[place] INPATIENT: Family/Significant Other Suicide Prevention Education   Suicide Prevention Education:  Education Completed; No one has been identified by the patient as the family member/significant other with whom the patient will be residing, and identified as the person(s) who will aid the patient in the event of a mental health crisis (suicidal ideations/suicide attempt).   Pt did not c/o SI at admission, nor have they endorsed SI during their stay here. SPE not required. SPI pamphlet provided to pt and he was encouraged to share information with his support network, ask questions, and talk about any concerns.   The suicide prevention education provided includes the following:  Suicide risk factors  Suicide prevention and interventions  National Suicide Hotline telephone number  [facility] assessment telephone number  [facility] Emergency Assistance [place] and/or Residential Mobile Crisis Unit telephone number  Regan Lemming, [place] 05/26/2013  10:51 AM

## 2013-05-26 NOTE — Progress Notes (Signed)
The focus of this group is to educate the patient on the purpose and policies of crisis stabilization and provide a format to answer questions about their admission.  The group details unit policies and expectations of patients while admitted.  Patient attended 0900 nurse education orientation group this morning.  Patient actively participated, appropriate affect, alert, appropriate insight and engagement.

## 2013-05-26 NOTE — Progress Notes (Signed)
[facility] Adult Case Management Discharge Plan :  Will you be returning to the same living situation after discharge: Yes,  returning to own home.  Pt states that her mom is supportive.  At discharge, do you have transportation home?:Yes,  pt states that her mom will pick her up Do you have the ability to pay for your medications:Yes,  provided pt with prescriptions and pt verbalizes ability to afford meds  Release of information consent forms completed and in the chart;  Patient's signature needed at discharge.  Patient to Follow up at: Follow-up Information   Follow up with [facility] On 05/29/2013. (Appointment scheduled at 1:00 pm on this date with Dr. Harrington Challenger for medication management. )    Contact information:   [address] [place], [place] [address] Phone: [phone]      Follow up with [facility] On 06/08/2013. (Appointment scheduled at 1:45 pm on this date with Maurice Small for therapy.  )    Contact information:   [address], [place], [place] [address] Phone: [phone]      Patient denies SI/HI:   Yes,  denies SI/HI    Safety Planning and Suicide Prevention discussed:  Yes,  discussed with pt.  N/A to contact family/friend due to no SI on admission.  See suicide prevention education note.   CSW attempted to reach pt's mother, Mittie Bodo [phone]) twice and unable to leave a voicemail.    Lakeesha Fontanilla N Horton 05/26/2013, 10:49 AM

## 2013-05-26 NOTE — Discharge Summary (Signed)
Physician Discharge Summary Note  Patient:  Tonya Sullivan is an 58 y.o., female MRN:  [id number] DOB:  Sep 15, 1955 Patient phone:  [phone] (home)  Patient address:   [place] [address],  Total Time spent with patient: 30 minutes  Date of Admission:  05/21/2013 Date of Discharge: 05/26/2013  Reason for Admission:  Overdose  Discharge Diagnoses: Active Problems:   MDD (major depressive disorder), recurrent severe, without psychosis   PTSD (post-traumatic stress disorder)   Unspecified episodic mood disorder   Psychiatric Specialty Exam:     Please see D/C SRA Physical Exam  ROS  Blood pressure 110/79, pulse 77, temperature 98.1 F (36.7 C), temperature source Oral, resp. rate 16, height 5\' 7"  (1.702 m), weight 87.998 kg (194 lb), SpO2 97.00%.Body mass index is 30.38 kg/(m^2).    Past Psychiatric History:  Diagnosis:   Hospitalizations: [facility], [place]   Outpatient Care: Dr. Harrington Challenger, Maurice Small at [facility]   Substance Abuse Care: Denies   Self-Mutilation:Denies   Suicidal Attempts:Denies   Violent Behaviors:Denies    Discharge Diagnoses:  AXIS I: Bipolar, mixed  AXIS II: Cluster B Traits  AXIS III:  Past Medical History   Diagnosis  Date   .  COPD (chronic obstructive pulmonary disease)    .  Depression    .  Chronic back pain      DDD, disc bulge, radiculopathy, spinal stenosis   .  Hyperlipidemia    .  Anxiety    .  Bipolar disorder, unspecified    .  Carotid artery calcification    .  DDD (degenerative disc disease), lumbosacral    .  Spinal stenosis, lumbar    .  Thyroid disease    .  Autoimmune thyroiditis    .  Hypertension    .  TIA (transient ischemic attack)      august 2014   .  GERD (gastroesophageal reflux disease)    .  H/O dizziness    .  Neuropathic pain    .  Adrenal gland dysfunction      Addison's disease ON DAILY PREDNISONE   .  Addison's disease    .  Stroke      HX OF MINI STROKE AUG 2014-  DROPPIN EVERYTHING, SPEECH SLURRED, MEMORY AFFECTED --ALL RESOLVED - NO DEFICITS NOW   .  Pneumonia      LAST PNEUMONIA WAS AUG 2014   .  Achalasia    .  Mouth ulcers    .  Dysrhythmia      palpitations   .  Melanoma      removed 1 week ago at Dr Teryl Lucy office   .  Addison's disease     AXIS IV: economic problems, other psychosocial or environmental problems, problems related to social environment and problems with primary support group  AXIS V: 51-60 moderate symptoms   Level of Care:  OP  Hospital Course:  [name] is an 58 y.o. female that presents to [facility] for a evaluation. She was referred by her psychiatrist-Dr. Harrington Challenger at the [facility] outpatient clinic in [place]. Per nursing notes, "Dr. Harrington Challenger called earlier this morning and stated she was sending pt over to be evaluated". Reported patient told Dr. Harrington Challenger, in a phone conversation, that she was having suicidal thoughts. Patient sts, "I didn't say that I said that I might have suicidal thoughts". Nevertheless, Dr. Harrington Challenger asked patient to come in once her CNA arrived to bring her  in.      Tonya Sullivan was admitted to the adult unit where she was evaluated and her symptoms were identified.  The patient recanted SI/HI several times prior to arrival on the unit but did disclose SI without intent or plan on initial evaluation. She also endorsed increasing depression, anhedonia, insomnia, fatigue, hopelessness, anxiety, insomnia, loss of energy and fatigue, poor appetite, mood lability, excessive worry, and intrusive thoughts.  On exam she appeared disheveled with clear coherent pressured speech and circumstantiality. She was anxious and irritable, memory was fair, insight was shallow, and judgement was fair. She had some restlessness, poor concentration and poor recall.       Medication management was discussed and implemented. She was encouraged to participate in unit programming. Medical problems were identified and treated appropriately. Home  medication was restarted as needed. Every effort was made to reduce the number of sedating medications to decrease her fall risk, overdose potential, or abuse potential.  She reported overdose from her blood pressure medications but stated "It wasn't my fault!" She explained that her medications are "prepared for her in a dose box."                She was evaluated each day by a clinical provider to ascertain the patient's response to treatment.  Improvement was noted by the patient's report of decreasing symptoms, improved sleep and appetite, affect, medication tolerance, behavior, and participation in unit programming.  The patient was asked each day to complete a self inventory noting mood, mental status, pain, new symptoms, anxiety and concerns.       [name] was irritable, refused to attend groups, and felt to be hypomanic. She refused to consider any antipsychotics for mood stabilization for one reason after another. [name] stated that she didn't need the groups as they were " all the same!"  LCSW attempted to gather collateral information regarding her base line status from family, but was not able to speak with any family members. She continued to demand to go home stating,  "that's what Dr. Sabra Heck said on Friday! That I would be going home on Monday!!" Multiple attempts were made to explain to the patient that this was not documented in any notes, but she refused to believe this. Depakote 500mg  at hs was started in attempt to stabilize her mood. It was felt that she would not gather any further benefit from continued in patient stay.       The patient continued to deny SI/HI and voiced no AVH. She was not interested in pursuing any further treatment at [facility] and requested discharge. Tonya Sullivan was discharged home with a plan to follow up as noted below. Consults:  None  Significant Diagnostic Studies:  None  Discharge Vitals:   Blood pressure 110/79, pulse 77, temperature 98.1 F (36.7 C),  temperature source Oral, resp. rate 16, height 5\' 7"  (1.702 m), weight 87.998 kg (194 lb), SpO2 97.00%. Body mass index is 30.38 kg/(m^2). Lab Results:   No results found for this or any previous visit (from the past 72 hour(s)).  Physical Findings: AIMS: Facial and Oral Movements Muscles of Facial Expression: None, normal Lips and Perioral Area: None, normal Jaw: None, normal Tongue: None, normal,Extremity Movements Upper (arms, wrists, hands, fingers): None, normal Lower (legs, knees, ankles, toes): None, normal, Trunk Movements Neck, shoulders, hips: None, normal, Overall Severity Severity of abnormal movements (highest score from questions above): None, normal Incapacitation due to abnormal movements: None, normal Patient's awareness of abnormal  movements (rate only patient's report): No Awareness, Dental Status Current problems with teeth and/or dentures?: No Does patient usually wear dentures?: No  CIWA:  CIWA-Ar Total: 1 COWS:  COWS Total Score: 1  Psychiatric Specialty Exam: See Psychiatric Specialty Exam and Suicide Risk Assessment completed by Attending Physician prior to discharge.  Discharge destination:  Home  Is patient on multiple antipsychotic therapies at discharge:  No   Has Patient had three or more failed trials of antipsychotic monotherapy by history:  No  Recommended Plan for Multiple Antipsychotic Therapies: NA      Discharge Instructions   Diet - low sodium heart healthy    Complete by:  As directed      Discharge instructions    Complete by:  As directed   Take all of your medications as directed. Be sure to keep all of your follow up appointments.  If you are unable to keep your follow up appointment, call your Doctor's office to let them know, and reschedule.  Make sure that you have enough medication to last until your appointment. Be sure to get plenty of rest. Going to bed at the same time each night will help. Try to avoid sleeping during the  day.  Increase your activity as tolerated. Regular exercise will help you to sleep better and improve your mental health. Eating a heart healthy diet is recommended. Try to avoid salty or fried foods. Be sure to avoid all alcohol and illegal drugs.     Increase activity slowly    Complete by:  As directed             Medication List    STOP taking these medications       escitalopram 20 MG tablet  Commonly known as:  LEXAPRO      TAKE these medications     Indication   albuterol 108 (90 BASE) MCG/ACT inhaler  Commonly known as:  PROVENTIL HFA;VENTOLIN HFA  Inhale 2 puffs into the lungs 3 (three) times daily as needed for wheezing or shortness of breath.   Indication:  Chronic Obstructive Lung Disease     albuterol (2.5 MG/3ML) 0.083% nebulizer solution  Commonly known as:  PROVENTIL  Take 3 mLs (2.5 mg total) by nebulization every 4 (four) hours as needed for wheezing.   Indication:  Acute Bronchospasm     amitriptyline 100 MG tablet  Commonly known as:  ELAVIL  Take 1 tablet (100 mg total) by mouth at bedtime.   Indication:  Trouble Sleeping     baclofen 10 MG tablet  Commonly known as:  LIORESAL  Take 1 tablet (10 mg total) by mouth daily.   Indication:  Muscle Spasticity     clonazePAM 1 MG tablet  Commonly known as:  KLONOPIN  Take 1 tablet (1 mg total) by mouth 2 (two) times daily.   Indication:  Manic-Depression, Panic Disorder     divalproex 500 MG 24 hr tablet  Commonly known as:  DEPAKOTE ER  Take 1 tablet (500 mg total) by mouth daily.   Indication:  Manic Phase of Manic-Depression     DULoxetine 60 MG capsule  Commonly known as:  CYMBALTA  Take 1 capsule (60 mg total) by mouth every morning.   Indication:  Fibromyalgia Syndrome     lidocaine 5 %  Commonly known as:  LIDODERM  Place 1 patch onto the skin every 12 (twelve) hours.   Indication:  pain     metoprolol tartrate 25 MG tablet  Commonly known as:  LOPRESSOR  Take 1 tablet (25 mg total) by  mouth 2 (two) times daily with a meal.   Indication:  High Blood Pressure     oxybutynin 10 MG 24 hr tablet  Commonly known as:  DITROPAN XL  Take 1 tablet (10 mg total) by mouth daily.   Indication:  Urinary Incontinence     pantoprazole 40 MG tablet  Commonly known as:  PROTONIX  Take 1 tablet (40 mg total) by mouth daily.   Indication:  Gastroesophageal Reflux Disease     pravastatin 40 MG tablet  Commonly known as:  PRAVACHOL  Take 1 tablet (40 mg total) by mouth every evening.   Indication:  Inherited Heterozygous Hypercholesterolemia     predniSONE 2.5 MG tablet  Commonly known as:  DELTASONE  Take 3 tablets (7.5 mg total) by mouth daily with breakfast.   Indication:  Chronic Obstructive Lung Disease     tiotropium 18 MCG inhalation capsule  Commonly known as:  SPIRIVA  Place 1 capsule (18 mcg total) into inhaler and inhale daily.   Indication:  Chronic Obstructive Lung Disease     traMADol 50 MG tablet  Commonly known as:  ULTRAM  Take 1 tablet (50 mg total) by mouth every 8 (eight) hours as needed for moderate pain.   Indication:  Moderate to Moderately Severe Pain           Follow-up Information   Follow up with [facility] On 05/29/2013. (Appointment scheduled at 1:00 pm on this date with Dr. Harrington Challenger for medication management. )    Contact information:   [address] [place], [place] [address] Phone: [phone]      Follow up with [facility] On 06/08/2013. (Appointment scheduled at 1:45 pm on this date with Maurice Small for therapy.  )    Contact information:   [address], [place], [place] [address] Phone: [phone]      Follow-up recommendations:   Activities: Resume activity as tolerated. Diet: Heart healthy low sodium diet Tests: Follow up testing will be determined by your out patient provider. Comments:  This patient has been recommended for ALF multiple times and declines.  It is  recommended that she have a Pharm D. Consult to minimize the number of medications that she is taking and to reduce the polypharmacy, risk of over sedation, decrease fall risk, and potential for abuse.  It is also recommended that her PCP and specialists remain in contact to reduce the number of prescriptions for similar symptoms.   Total Discharge Time:  Greater than 30 minutes.  Signed: Marlane Hatcher. [place] RPAC 10:50 AM 05/26/2013  Patient was seen for psychiatric evaluation, suicide risk assessment and case discussed with treatment team, physician extender and made appropriate disposition plan. Reviewed the information documented and agree with the treatment plan.  Parke Simmers Mamadou Breon 05/26/2013 12:32 PM

## 2013-05-27 NOTE — Progress Notes (Signed)
Patient ID: Tonya Sullivan, female   DOB: 20-Dec-1955, 58 y.o.   MRN: [id number] PER STATE REGULATIONS 482.30  THIS CHART WAS REVIEWED FOR MEDICAL NECESSITY WITH RESPECT TO THE PATIENT'S ADMISSION/ DURATION OF STAY.  NEXT REVIEW DATE: 05/28/2013  Chauncy Lean, RN, BSN CASE MANAGER

## 2013-05-28 ENCOUNTER — Telehealth (INDEPENDENT_AMBULATORY_CARE_PROVIDER_SITE_OTHER): Payer: Self-pay

## 2013-05-28 NOTE — Telephone Encounter (Signed)
Pt s/p lap heller myotomy in March by Dr. Rosendo Gros.  She saw him for a follow up on 05/20/13.  She is calling today c/o coughing x 2 weeks which happens when she tries to swallow food or liquid.  The coughing spells make her dizzy and she has to sit down.  She does not recall seeing Dr. Rosendo Gros on 05/20/13.  Dr. Rosendo Gros advised to monitor her eating/coughing over the weekend.  If still happening to call our office.  He may order a swallow study.  Pt seemed to understand and said she would call us back next week.

## 2013-05-29 ENCOUNTER — Ambulatory Visit (INDEPENDENT_AMBULATORY_CARE_PROVIDER_SITE_OTHER): Payer: Medicare Other | Admitting: Psychiatry

## 2013-05-29 ENCOUNTER — Encounter (HOSPITAL_COMMUNITY): Payer: Self-pay | Admitting: Psychiatry

## 2013-05-29 VITALS — BP 150/90 | Ht 67.0 in | Wt 192.0 lb

## 2013-05-29 DIAGNOSIS — F411 Generalized anxiety disorder: Secondary | ICD-10-CM

## 2013-05-29 DIAGNOSIS — F332 Major depressive disorder, recurrent severe without psychotic features: Secondary | ICD-10-CM

## 2013-05-29 MED ORDER — DIVALPROEX SODIUM ER 500 MG PO TB24: 500.0000 mg | ORAL_TABLET | Freq: Every day | ORAL | Status: DC

## 2013-05-29 MED ORDER — DULOXETINE HCL 60 MG PO CPEP: 60.0000 mg | ORAL_CAPSULE | Freq: Every morning | ORAL | Status: DC

## 2013-05-29 MED ORDER — CLONAZEPAM 1 MG PO TABS: 1.0000 mg | ORAL_TABLET | Freq: Four times a day (QID) | ORAL | Status: DC

## 2013-05-29 MED ORDER — AMITRIPTYLINE HCL 100 MG PO TABS: 100.0000 mg | ORAL_TABLET | Freq: Every day | ORAL | Status: DC

## 2013-05-29 NOTE — Progress Notes (Signed)
Patient Discharge Instructions:  Next Level Care Provider Has Access to the EMR, 05/29/13 Records provided to [facility] via CHL/Epic access.  Tonya Sullivan, 05/29/2013, 1:46 PM
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
## 2013-06-03 ENCOUNTER — Inpatient Hospital Stay: Payer: Medicare Other | Admitting: Family Medicine

## 2013-06-04 ENCOUNTER — Encounter: Payer: Self-pay | Admitting: *Deleted

## 2013-06-04 ENCOUNTER — Encounter: Payer: Self-pay | Admitting: Adult Health

## 2013-06-04 NOTE — Progress Notes (Signed)
Error. No show.

## 2013-06-08 ENCOUNTER — Ambulatory Visit (INDEPENDENT_AMBULATORY_CARE_PROVIDER_SITE_OTHER): Payer: Medicare Other | Admitting: Psychiatry

## 2013-06-08 DIAGNOSIS — F332 Major depressive disorder, recurrent severe without psychotic features: Secondary | ICD-10-CM

## 2013-06-08 NOTE — Patient Instructions (Signed)
Discussed orally 

## 2013-06-08 NOTE — Progress Notes (Addendum)
   THERAPIST PROGRESS NOTE  Session Time: Monday 06/08/2013 2:05 PM - 2:35 PM  Participation Level: Active  Behavioral Response: CasualAlert/Anxious  Type of Therapy: Individual Therapy  Treatment Goals addressed:  Improve ability to manage stress and anxiety using coping skills and relaxation techniques, Improve assertiveness skills  Interventions: CBT and Supportive  Summary: Lanora A Korf is a 58 y.o. female who presents with a long-standing history of depression beginning in adolescence. Patient also has significant trauma history. Patient also experienced grief and loss issues due to to the death of her husband in Oct 08, 2010. She continues to experience stress related to ongoing health issues as well as family problems. Patient reports being hospitalized at the [facility] since last session due to reaction to medication and depression. She reports having no suicidal ideations since discharge. She continues to express sadness regarding the relationship with her son and his family but is less stressed by this. She reports continued support from her mother and stepfather, her brothers, and her friends. She reports one conflict with mother but being able to manage.  She also has support from a home health nurse as well as a CNA and a Tourist information centre manager. Patient reports frequent falls at home and fears she may need to be placed in an assisted living facility. Patient has a cane but did not bring to session today.  Suicidal/Homicidal: No  Therapist Response: Therapist works with patient to process feelings, encourage patient to cooperate with medical providers and use walking assistance assistance providers suggest, explore possible benefits of being in an assisted living facility, discuss being assertive versus being aggressive  Plan: Return again in 3 weeks.  Diagnosis: Axis I: MDD    Axis II: Deferred    Takeshi Teasdale, LCSW 06/08/2013

## 2013-06-09 LAB — VALPROIC ACID LEVEL: Valproic Acid Lvl: 78.5 ug/mL (ref 50.0–100.0)

## 2013-06-09 NOTE — Progress Notes (Signed)
,      Error no show

## 2013-06-10 ENCOUNTER — Encounter: Payer: Self-pay | Admitting: Adult Health

## 2013-06-11 ENCOUNTER — Emergency Department (HOSPITAL_COMMUNITY): Payer: Medicare Other

## 2013-06-11 ENCOUNTER — Telehealth: Payer: Self-pay | Admitting: *Deleted

## 2013-06-11 ENCOUNTER — Emergency Department (HOSPITAL_COMMUNITY)
Admission: EM | Admit: 2013-06-11 | Discharge: 2013-06-11 | Disposition: A | Discharge status: Home / Self Care | Payer: Medicare Other | Attending: Emergency Medicine | Admitting: Emergency Medicine

## 2013-06-11 ENCOUNTER — Encounter (HOSPITAL_COMMUNITY): Payer: Self-pay | Admitting: Emergency Medicine

## 2013-06-11 DIAGNOSIS — F411 Generalized anxiety disorder: Secondary | ICD-10-CM | POA: Insufficient documentation

## 2013-06-11 DIAGNOSIS — J159 Unspecified bacterial pneumonia: Secondary | ICD-10-CM | POA: Insufficient documentation

## 2013-06-11 DIAGNOSIS — F17200 Nicotine dependence, unspecified, uncomplicated: Secondary | ICD-10-CM | POA: Insufficient documentation

## 2013-06-11 DIAGNOSIS — IMO0002 Reserved for concepts with insufficient information to code with codable children: Secondary | ICD-10-CM | POA: Insufficient documentation

## 2013-06-11 DIAGNOSIS — S298XXA Other specified injuries of thorax, initial encounter: Secondary | ICD-10-CM | POA: Insufficient documentation

## 2013-06-11 DIAGNOSIS — J449 Chronic obstructive pulmonary disease, unspecified: Secondary | ICD-10-CM | POA: Insufficient documentation

## 2013-06-11 DIAGNOSIS — Y939 Activity, unspecified: Secondary | ICD-10-CM | POA: Insufficient documentation

## 2013-06-11 DIAGNOSIS — Y92009 Unspecified place in unspecified non-institutional (private) residence as the place of occurrence of the external cause: Secondary | ICD-10-CM | POA: Insufficient documentation

## 2013-06-11 DIAGNOSIS — J4489 Other specified chronic obstructive pulmonary disease: Secondary | ICD-10-CM | POA: Insufficient documentation

## 2013-06-11 DIAGNOSIS — J189 Pneumonia, unspecified organism: Secondary | ICD-10-CM

## 2013-06-11 DIAGNOSIS — Z85820 Personal history of malignant melanoma of skin: Secondary | ICD-10-CM | POA: Insufficient documentation

## 2013-06-11 DIAGNOSIS — G8929 Other chronic pain: Secondary | ICD-10-CM | POA: Insufficient documentation

## 2013-06-11 DIAGNOSIS — W19XXXA Unspecified fall, initial encounter: Secondary | ICD-10-CM

## 2013-06-11 DIAGNOSIS — Z8673 Personal history of transient ischemic attack (TIA), and cerebral infarction without residual deficits: Secondary | ICD-10-CM | POA: Insufficient documentation

## 2013-06-11 DIAGNOSIS — Z9181 History of falling: Secondary | ICD-10-CM | POA: Insufficient documentation

## 2013-06-11 DIAGNOSIS — F319 Bipolar disorder, unspecified: Secondary | ICD-10-CM | POA: Insufficient documentation

## 2013-06-11 DIAGNOSIS — E785 Hyperlipidemia, unspecified: Secondary | ICD-10-CM | POA: Insufficient documentation

## 2013-06-11 DIAGNOSIS — R296 Repeated falls: Secondary | ICD-10-CM | POA: Insufficient documentation

## 2013-06-11 DIAGNOSIS — I10 Essential (primary) hypertension: Secondary | ICD-10-CM | POA: Insufficient documentation

## 2013-06-11 DIAGNOSIS — Z79899 Other long term (current) drug therapy: Secondary | ICD-10-CM | POA: Insufficient documentation

## 2013-06-11 DIAGNOSIS — K219 Gastro-esophageal reflux disease without esophagitis: Secondary | ICD-10-CM | POA: Insufficient documentation

## 2013-06-11 DIAGNOSIS — Z8739 Personal history of other diseases of the musculoskeletal system and connective tissue: Secondary | ICD-10-CM | POA: Insufficient documentation

## 2013-06-11 HISTORY — DX: Repeated falls: R29.6

## 2013-06-11 MED ORDER — IBUPROFEN 400 MG PO TABS
400.0000 mg | ORAL_TABLET | Freq: Once | ORAL | Status: AC
  Administered 2013-06-11: 400 mg via ORAL
  Filled 2013-06-11: qty 1

## 2013-06-11 MED ORDER — ACETAMINOPHEN 500 MG PO TABS
1000.0000 mg | ORAL_TABLET | Freq: Once | ORAL | Status: AC
  Administered 2013-06-11: 1000 mg via ORAL
  Filled 2013-06-11: qty 2

## 2013-06-11 MED ORDER — DOXYCYCLINE HYCLATE 100 MG PO TABS
100.0000 mg | ORAL_TABLET | Freq: Two times a day (BID) | ORAL | Status: DC
Start: 2013-06-11 — End: 2013-07-15

## 2013-06-11 NOTE — Discharge Instructions (Signed)
°Emergency Department Resource Guide °1) Find a Doctor and Pay Out of Pocket °Although you won't have to find out who is covered by your insurance plan, it is a good idea to ask around and get recommendations. You will then need to call the office and see if the doctor you have chosen will accept you as a new patient and what types of options they offer for patients who are self-pay. Some doctors offer discounts or will set up payment plans for their patients who do not have insurance, but you will need to ask so you aren't surprised when you get to your appointment. ° °2) Contact Your Local Health Department °Not all health departments have doctors that can see patients for sick visits, but many do, so it is worth a call to see if yours does. If you don't know where your local health department is, you can check in your phone book. The CDC also has a tool to help you locate your state's health department, and many state websites also have listings of all of their local health departments. ° °3) Find a Walk-in Clinic °If your illness is not likely to be very severe or complicated, you may want to try a walk in clinic. These are popping up all over the country in pharmacies, drugstores, and shopping centers. They're usually staffed by nurse practitioners or physician assistants that have been trained to treat common illnesses and complaints. They're usually fairly quick and inexpensive. However, if you have serious medical issues or chronic medical problems, these are probably not your best option. ° °No Primary Care Doctor: °- Call Health Connect at  832-8000 - they can help you locate a primary care doctor that  accepts your insurance, provides certain services, etc. °- Physician Referral Service- 1-800-533-3463 ° °Chronic Pain Problems: °Organization         Address  Phone   Notes  °[facility]  (336) 297-2271 Patients need to be referred by their primary care doctor.  ° °Medication  Assistance: °Organization         Address  Phone   Notes  °[place] [facility] [address] °[place], [place] [address] (336) 641-8030 --Must be a resident of [place] °-- Must have NO insurance coverage whatsoever (no Medicaid/ Medicare, etc.) °-- The pt. MUST have a primary care doctor that directs their care regularly and follows them in the community °  °[facility]  (866) 331-1348   °[employer]  (888) 892-1162   ° °Agencies that provide inexpensive medical care: °Organization         Address  Phone   Notes  °[facility]  (336) 832-8035   °[facility]    (336) 832-7272   °[facility] [address] °[place], [place] [address] (336) 832-4777   °[facility] [address], °[place] (336) 271-4999   °[facility]    (336) 373-0678   °[facility]    (336) 272-1050   °[facility] ° [address], [place] Phone:  (336) 832-4444, Fax:  (336) 832-4440 Hours of Operation:  9 am - 6 pm, M-F.  Also accepts Medicaid/Medicare and self-pay.  °[facility] ° [address], [place] Phone: (336) 832-3150, Fax: (336) 832-3151. Hours of Operation:  8:30 am - 5:30 pm, M-F.  Also accepts Medicaid and self-pay.  °[facility] [place] [address], [place] Phone: (336) 878-6027   °[facility] [address], [place], [place] (336)723-1848, Ext. 123 Mondays & Thursdays: 7-9 AM.  First 15 patients are seen on a first come, first serve basis. °  ° °Medicaid-accepting [place] Providers: ° °Organization         Address  Phone   Notes  °[facility] [address], [place] (336) 641-2100 Also accepts self-pay patients.  °[facility] [address], [place] ° (336) 856-9996   °[facility] [address], [place]  (336) 288-8857   °[facility] [address] [place] [address], [place] (336) 299-7000   °[facility] [address], [place]  ° (336) 373-1557 Only accepts [place] Access Medicaid patients after they have their name applied to their card.  ° °Self-Pay (no insurance) in [place]: ° °Organization         Address  Phone   Notes  °Sickle Cell Patients, [facility] [address], [place] (336) 832-1970   °[facility] [address], [place] (336) 832-4400   °[facility] ° [address], [place] (336) 992-4800   °[facility]/Dr. Osei-Bonsu ° [address] [place] [address], [place] or [address], [place] (336) 841-8500 Phone number for both [place] and [place] locations is the same.  °[facility] [address], [place] (336) 299-0000   °[facility] [address] [place] [address], [place] or [address] (336) 852-7530 °(336) 878-2260   °[facility] [address], [place] (336) 350-1642, phone; (336) 294-5005, fax Sees patients 1st and 3rd Saturday of every month.  Must not qualify for public or private insurance (i.e. Medicaid, Medicare, [place] Health Choice, Veterans' Benefits) • Household income should be no more than 200% of the poverty level •The clinic cannot treat you if you are pregnant or think you are pregnant • Sexually transmitted diseases are not treated at the clinic.  ° ° °Dental Care: °Organization         Address  Phone  Notes  °[place] [facility] [address], [place] (336) 641-6152 Accepts children up to age 21 who are enrolled in Medicaid or [place] Health Choice; pregnant women with a Medicaid card; and children who have applied for Medicaid or [place] Health Choice, but were declined, whose parents can pay a reduced fee at time of service.  °[place]  [facility] [place]  [address], [place] (336) 641-7733 Accepts children up to age 21 who are enrolled in Medicaid or [place] Health Choice; pregnant women with a Medicaid card; and children who have applied for Medicaid or  Health Choice, but were declined, whose parents can pay a reduced fee at time of service.  °[facility] ° [address], [place] (336) 641-4533 Patients are seen by appointment only. Walk-ins are not accepted. [facility] will see patients 18 years of age and older. °Monday - Tuesday (8am-5pm) °Most Wednesdays (8:30-5pm) °$30 per visit, cash only  °[facility] ° [address], [place] (336) 641-4533 Patients are seen by appointment only. Walk-ins are not accepted. [facility] will see patients 18 years of age and older. °One   Wednesday Evening (Monthly: Volunteer Based).  $30 per visit, cash only  °[facility]  (919) 537-3737 for adults; Children under age 4, call Graduate Pediatric Dentistry at (919) [phone]. Children aged 4-14, please call (919) [phone] to request a pediatric application. ° Dental services are provided in all areas of dental care including fillings, crowns and bridges, complete and partial dentures, implants, gum treatment, root canals, and extractions. Preventive care is also provided. Treatment is provided to both adults and children. °Patients are selected via a lottery and there is often a waiting list. °  °[facility] [address], °[place] ° (336) 763-8833 [email or web address] °  °[facility] [address], [place], [place] (336)723-1848, Ext. 123 Second and Fourth Thursday of each month, opens at 6:30 AM; Clinic ends at 9 AM.  Patients are seen on a first-come first-served basis, and a limited number are seen during each clinic.  ° °[facility] ° [address], [place], [place] (336) 723-7904    Eligibility Requirements °You must have lived in [place], [place], or [place] counties for at least the last three months. °  You cannot be eligible for state or federal sponsored healthcare insurance, including Veterans Administration, Medicaid, or Medicare. °  You generally cannot be eligible for healthcare insurance through your employer.  °  How to apply: °Eligibility screenings are held every Tuesday and Wednesday afternoon from 1:00 pm until 4:00 pm. You do not need an appointment for the interview!  °[facility] [address], [place], [place] 336-631-2330   °[facility]  336-342-8273   °[place]  336-703-3100   ° [facility]  336-570-6415   ° °Behavioral Health Resources in the Community: °Intensive Outpatient Programs °Organization         Address  Phone  Notes  °[facility] [address], [place], [place] 336-878-6098   °[facility] [address], [place], [place] 336-832-9800   °[facility] [address], [place], [place] ° 336-882-2125   °[facility] [address],  °[place], [place] 1-800-853-5163 or 336-641-4981   °Substance Abuse Resources °Organization         Address  Phone  Notes  °[facility]  336-882-2125   °[facility]  336-784-9470   °[facility]  336-285-9073   °[facility]  336-845-3988   °Residential & Outpatient Substance Abuse Program  1-800-659-3381   °Psychological Services °Organization         Address  Phone  Notes  °[facility]  336- 832-9600   °[facility]  336- 378-7881   °[facility] [address], [place] 1-800-853-5163 or 336-641-4981   ° °Mobile Crisis Teams °Organization         Address  Phone  Notes  °[facility], Mobile Crisis Care Unit  1-877-626-1772   °Assertive °[facility] ° [address]  [place], [place] 336-834-9664   °Sharon DeEsch [address] °[place]  336-554-5454   ° °Self-Help/Support Groups °Organization         Address  Phone             Notes  °[facility] - variety of support groups  336- 373-1402 Call for more information  °Narcotics Anonymous (NA), [facility] [address], °[place]   2 meetings at this location  ° °  Residential Treatment Programs Organization         Address  Phone  Notes  [facility] [address]    [place]  1-(409) 304-8184   [facility]  [address] [place] [address], [place], [place]   [place], [place] (306)007-4685 Admissions: 8am-3pm M-F  [facility] [address],    [place], [place] 767-341-9379   [facility] [address] [place], [place], [place]   [facility] [address]  [place], [place]   [facility] - Intensive Outpatient [address], [place], [place]   [facility] [place]  [place], [place] 1-4801933448 or 816-209-2063   [facility] [address] [place], [place] Accepts Medicaid  [facility] [address],  [place] 1-5851565121 Substance Abuse/Addiction Treatment   [facility] Organization         Address  Phone  Notes  [facility]  928-655-9875   Domenic Schwab, PhD [address] Arlis [place], [place]   631-041-1834 or (828) 697-1937   [facility] [address] [place], [place] 813-406-5231   [facility] [address] [place], [place] 313 246 9489 Insurance/Medicaid/sponsorship through [facility] Families [address]                                    [place], [place] [phone] [place] [address], [place] (914)630-4618    Dr. Adele Schilder  417-329-4656   [facility] Dept. 1) [address] [place] 2) [place] 3)  [address], [place] (518)150-1474 (229)732-0513  619-021-5053   [place] (775) 104-1705 or (343)531-9626 (After Hours)      Take the prescription as directed.  Continue to take your usual prescriptions as previously directed. Apply moist heat or ice to the area(s) of discomfort, for 15 minutes at a time, several times per day for the next few days.  Do not fall asleep on a heating or ice pack.  Call your regular medical doctor today to schedule a follow up appointment in the next 2 days.  Return to the Emergency Department immediately if worsening.

## 2013-06-11 NOTE — ED Notes (Signed)
Pt states frequent falls. States she is unsure how she fell last night but since falling, she is having pain to right breast and right upper rib area. NAD.

## 2013-06-11 NOTE — Telephone Encounter (Signed)
Pt was informed NTBS first or go to urgent care, pt is going to wait until her appointment next Friday, states if it gets worse then she will call to make appt.

## 2013-06-11 NOTE — ED Provider Notes (Signed)
CSN: [id number]     Arrival date & time 06/11/13  1240 History   First MD Initiated Contact with Patient 06/11/13 1313     Chief Complaint  Patient presents with  . Fall      HPI Pt was seen at 1315. Per pt, c/o gradual onset and persistence of constant right ribs "pain" that began last night. Pt states she has hx of frequent falls, and she fell again last night. Pt states she fell to the floor on her right side. Pt c/o pain in her right ribs since the fall. Pt is concerned she "broke my ribs" and is requesting an xray. Pt states her frequent falling is currently being worked up by her PMD as an outpatient. Denies any other injuries. Denies prodromal symptoms before falling, no syncope/near syncope, no head injury, no CP/palpitations, no SOB/cough, no neck or back pain, no abd pain, no focal motor weakness, no tingling/numbness in extremities.      Past Medical History  Diagnosis Date  . COPD (chronic obstructive pulmonary disease)   . Depression   . Chronic back pain     DDD, disc bulge, radiculopathy, spinal stenosis  . Hyperlipidemia   . Anxiety   . Bipolar disorder, unspecified   . Carotid artery calcification   . DDD (degenerative disc disease), lumbosacral   . Spinal stenosis, lumbar   . Thyroid disease   . Autoimmune thyroiditis   . Hypertension   . TIA (transient ischemic attack)     august 2014  . GERD (gastroesophageal reflux disease)   . H/O dizziness   . Neuropathic pain   . Adrenal gland dysfunction     Addison's disease ON DAILY PREDNISONE  . Addison's disease   . Stroke     HX OF MINI STROKE AUG 2014- DROPPIN EVERYTHING, SPEECH SLURRED, MEMORY AFFECTED --ALL RESOLVED - NO DEFICITS NOW  . Pneumonia     LAST PNEUMONIA WAS AUG 2014  . Achalasia   . Mouth ulcers   . Dysrhythmia     palpitations  . Melanoma     removed 1 week ago at Dr Teryl Lucy office  . Addison's disease   . Frequent falls    Past Surgical History  Procedure Laterality Date  . Appendectomy     . Cholecystectomy    . Lump left breast      removed- benign  . Abdominal hysterectomy      tubal pregnancy  . Inguinal hernia repair    . Stomach surgery      ?holes in esophgous  . Ectopic pregnancy surgery    . Back surgery      x 5;1984;1989;;1999;2000;2010  . Colonoscopy  01/25/2012    Procedure: COLONOSCOPY;  Surgeon: Rogene Houston, MD;  Location: [facility];  Service: Endoscopy;  Laterality: N/A;  130  . Esophagogastroduodenoscopy (egd) with esophageal dilation N/A 08/13/2012    Procedure: ESOPHAGOGASTRODUODENOSCOPY (EGD) WITH ESOPHAGEAL DILATION;  Surgeon: Rogene Houston, MD;  Location: [facility];  Service: Endoscopy;  Laterality: N/A;  315  . Anterior cervical decomp/discectomy fusion N/A 10/21/2012    Procedure: Cervical Six-Seven Anterior cervical decompression/diskectomy/fusion;  Surgeon: Kristeen Miss, MD;  Location: [facility];  Service: Neurosurgery;  Laterality: N/A;  Cervical Six-Seven Anterior cervical decompression/diskectomy/fusion  . Cataract extraction w/phaco Right 11/25/2012    Procedure: CATARACT EXTRACTION PHACO AND INTRAOCULAR LENS PLACEMENT (IOC);  Surgeon: Elta Guadeloupe T. Gershon Crane, MD;  Location: [facility];  Service: Ophthalmology;  Laterality: Right;  CDE:10.06  . Cataract  extraction w/phaco Left 12/09/2012    Procedure: CATARACT EXTRACTION PHACO AND INTRAOCULAR LENS PLACEMENT (IOC);  Surgeon: Elta Guadeloupe T. Gershon Crane, MD;  Location: [facility];  Service: Ophthalmology;  Laterality: Left;  CDE:5.06  . Tonsillectomy    . Hiatal hernia repair    . Heller myotomy N/A 03/17/2013    Procedure: DIAGNOSTIC LAPAROSCOPY, LAPAROSCOPIC HELLER MYOTOMY, ENDOSCOPY, DOR FUNDOPLICATION;  Surgeon: Ralene Ok, MD;  Location: [facility];  Service: General;  Laterality: N/A;  . Breast surgery      left breast  . Eye surgery     Family History  Problem Relation Age of Onset  . Hypertension Mother   . Heart disease Mother   . Heart attack Mother   . Anxiety disorder Mother   . Cancer  Father     lung cancer  . Heart attack Father   . Alcohol abuse Father   . Hypertension Brother   . Alcohol abuse Brother   . Bipolar disorder Brother   . ADD / ADHD Brother   . Drug abuse Brother   . OCD Brother   . Alcohol abuse Sister   . Bipolar disorder Sister   . Anxiety disorder Sister   . ADD / ADHD Sister   . Drug abuse Sister   . Physical abuse Sister   . Sexual abuse Sister   . Alcohol abuse Brother   . Bipolar disorder Brother   . ADD / ADHD Brother   . Drug abuse Brother   . Alcohol abuse Sister   . Bipolar disorder Sister   . Anxiety disorder Sister   . ADD / ADHD Sister   . Physical abuse Sister   . Sexual abuse Sister   . Alcohol abuse Brother   . Bipolar disorder Brother   . ADD / ADHD Brother   . Bipolar disorder Brother   . ADD / ADHD Brother   . Alcohol abuse Brother   . Paranoid behavior Brother   . Physical abuse Brother   . Sexual abuse Brother   . Bipolar disorder Maternal Aunt   . Bipolar disorder Paternal Aunt   . Bipolar disorder Maternal Uncle   . Bipolar disorder Paternal Uncle   . Bipolar disorder Maternal Grandfather   . Alcohol abuse Maternal Grandfather   . Bipolar disorder Maternal Grandmother   . Alcohol abuse Maternal Grandmother   . Anxiety disorder Maternal Grandmother   . Dementia Maternal Grandmother   . Bipolar disorder Paternal Grandfather   . Alcohol abuse Paternal Grandfather   . Bipolar disorder Paternal Grandmother   . Alcohol abuse Paternal Grandmother   . Drug abuse Paternal Grandmother   . Bipolar disorder Maternal Uncle   . Schizophrenia Neg Hx   . Seizures Neg Hx    History  Substance Use Topics  . Smoking status: Current Some Day Smoker -- 0.25 packs/day for 40 years    Types: Cigarettes    Last Attempt to Quit: 03/05/2013  . Smokeless tobacco: Never Used     Comment: 5 cigs day 03/12/13  . Alcohol Use: No   OB History   Grav Para Term Preterm Abortions TAB SAB Ect Mult Living   2 1 1  1   1  1       Review of Systems ROS: Statement: All systems negative except as marked or noted in the HPI; Constitutional: Negative for fever and chills. ; ; Eyes: Negative for eye pain, redness and discharge. ; ; ENMT: Negative for ear pain, hoarseness, nasal congestion,  sinus pressure and sore throat. ; ; Cardiovascular: Negative for chest pain, palpitations, diaphoresis, dyspnea and peripheral edema. ; ; Respiratory: Negative for cough, wheezing and stridor. ; ; Gastrointestinal: Negative for nausea, vomiting, diarrhea, abdominal pain, blood in stool, hematemesis, jaundice and rectal bleeding. . ; ; Genitourinary: Negative for dysuria, flank pain and hematuria. ; ; Musculoskeletal: +right ribs pain. Negative for back pain and neck pain. Negative for swelling and deformity.; ; Skin: Negative for pruritus, rash, abrasions, blisters, bruising and skin lesion.; ; Neuro: Negative for headache, lightheadedness and neck stiffness. Negative for weakness, altered level of consciousness , altered mental status, extremity weakness, paresthesias, involuntary movement, seizure and syncope.        Allergies  Codeine; Cyclobenzaprine; Darvocet; Abilify; Ace inhibitors; Adhesive; Gabapentin; Iodine; Levofloxacin; Prednisone; Sulfa antibiotics; and Robaxin  Home Medications   Prior to Admission medications   Medication Sig Start Date End Date Taking? Authorizing Provider  albuterol (PROVENTIL HFA;VENTOLIN HFA) 108 (90 BASE) MCG/ACT inhaler Inhale 2 puffs into the lungs 3 (three) times daily as needed for wheezing or shortness of breath. 05/26/13  Yes Nena Polio, PA-C  albuterol (PROVENTIL) (2.5 MG/3ML) 0.083% nebulizer solution Take 3 mLs (2.5 mg total) by nebulization every 4 (four) hours as needed for wheezing. 05/26/13  Yes Nena Polio, PA-C  amitriptyline (ELAVIL) 100 MG tablet Take 1 tablet (100 mg total) by mouth at bedtime. 05/29/13  Yes Levonne Spiller, MD  baclofen (LIORESAL) 10 MG tablet Take 1 tablet (10 mg  total) by mouth daily. 05/26/13  Yes Nena Polio, PA-C  clonazePAM (KLONOPIN) 1 MG tablet Take 1 tablet (1 mg total) by mouth 4 (four) times daily. 05/29/13  Yes Levonne Spiller, MD  Cyanocobalamin (VITAMIN B 12 PO) Take 1 tablet by mouth daily.   Yes Historical Provider, MD  divalproex (DEPAKOTE ER) 500 MG 24 hr tablet Take 1 tablet (500 mg total) by mouth daily. 05/29/13  Yes Levonne Spiller, MD  DULoxetine (CYMBALTA) 60 MG capsule Take 1 capsule (60 mg total) by mouth every morning. 05/29/13  Yes Levonne Spiller, MD  lidocaine (LIDODERM) 5 % Place 1 patch onto the skin every 12 (twelve) hours. 05/26/13  Yes Nena Polio, PA-C  metoprolol tartrate (LOPRESSOR) 25 MG tablet Take 1 tablet (25 mg total) by mouth 2 (two) times daily with a meal. 05/26/13  Yes Nena Polio, PA-C  Multiple Vitamins-Minerals (HAIR/SKIN/NAILS/BIOTIN PO) Take 1 tablet by mouth daily.   Yes Historical Provider, MD  oxybutynin (DITROPAN XL) 10 MG 24 hr tablet Take 1 tablet (10 mg total) by mouth daily. 05/26/13  Yes Nena Polio, PA-C  pantoprazole (PROTONIX) 40 MG tablet Take 1 tablet (40 mg total) by mouth daily. 05/26/13  Yes Nena Polio, PA-C  pravastatin (PRAVACHOL) 40 MG tablet Take 1 tablet (40 mg total) by mouth every evening. 05/26/13  Yes Nena Polio, PA-C  predniSONE (DELTASONE) 2.5 MG tablet Take 3 tablets (7.5 mg total) by mouth daily with breakfast. 05/26/13  Yes Nena Polio, PA-C  tiotropium (SPIRIVA) 18 MCG inhalation capsule Place 1 capsule (18 mcg total) into inhaler and inhale daily. 05/26/13  Yes Nena Polio, PA-C  traMADol (ULTRAM) 50 MG tablet Take 1 tablet (50 mg total) by mouth every 8 (eight) hours as needed for moderate pain. 05/26/13  Yes Neil Mashburn, PA-C   BP 111/61  Pulse 74  Temp(Src) 98.5 F (36.9 C) (Oral)  Resp 20  Ht 5\' 7"  (1.702 m)  Wt 194 lb (87.998 kg)  BMI 30.38 kg/m2  SpO2 97% Physical  Exam 1320: Physical examination: Vital signs and O2 SAT: Reviewed; Constitutional: Well developed,  Well nourished, Well hydrated, In no acute distress; Head and Face: Normocephalic, Atraumatic; Eyes: EOMI, PERRL, No scleral icterus; ENMT: Mouth and pharynx normal, Left TM normal, Right TM normal, Mucous membranes moist; Neck: Supple, Trachea midline; Spine: No erythema or ecchymosis. No midline CS, TS, LS tenderness.; Cardiovascular: Regular rate and rhythm, No murmur, rub, or gallop; Respiratory: Breath sounds clear & equal bilaterally, No rales, rhonchi, wheezes, Normal respiratory effort/excursion; Chest: +right lateral chest wall tender to palp without deformity, soft tissue crepitus, ecchymosis or erythema. Movement normal.; Abdomen: Soft, Nontender, Nondistended, Normal bowel sounds, No abrasions or ecchymosis.; Genitourinary: No CVA tenderness;; Extremities: No deformity, Full range of motion major/large joints of bilat UE's and LE's without pain or tenderness to palp, Neurovascularly intact, Pulses normal, No tenderness, No edema, Pelvis stable; Neuro: AA&Ox3, GCS 15.  Major CN grossly intact. Speech clear. No gross focal motor or sensory deficits in extremities. Climbs on and off stretcher easily by herself. Gait steady.; Skin: Color normal, Warm, Dry     ED Course  Procedures     EKG Interpretation None      MDM  MDM Reviewed: previous chart, nursing note and vitals Interpretation: x-ray    Dg Ribs Unilateral W/chest Right 06/11/2013   CLINICAL DATA:  Fall.  Right chest pain  EXAM: RIGHT RIBS AND CHEST - 3+ VIEW  COMPARISON:  04/22/2013  FINDINGS: Patchy airspace disease right lower lobe was not present previously. This could represent pneumonia. There is a right upper lobe 1 cm nodule which was not present previously and may be part of the same process. Correlate with symptoms. Followup until clearing suggested.  Negative for pleural effusion.  No pneumothorax.  Negative for right rib fracture.  IMPRESSION: Right lower lobe airspace disease and right upper lobe nodule. These  findings may represent pneumonia. Followup until clearing. Correlate with symptoms.  Negative for right rib fracture.   Electronically Signed   By: Franchot Gallo M.D.   On: 06/11/2013 14:59    1630:  Pt has gotten herself dressed and is walking around the ED wanting to go home now. Gait is steady, resps easy, appears NAD. Pt is not hypoxic with Sats 96-97% R/A. Will tx for CAP, tylenol/motrin prn pain. Dx and testing d/w pt.  Questions answered.  Verb understanding, agreeable to d/c home with outpt f/u.     Alfonzo Feller, DO 06/14/13 1354

## 2013-06-11 NOTE — Telephone Encounter (Signed)
Pt fell last pm around 6:00 and stated when she fell she felt an "electrical pain" on her R side, says her R side is hurting more today. No c/o of SOB. Pt does not want to go to ED, wants to know if you want an xray to see if she had broken anything. Please advise!

## 2013-06-11 NOTE — Telephone Encounter (Signed)
Pt needs appt, or she can go to urgent care, has to be seen first

## 2013-06-12 NOTE — Progress Notes (Signed)
No Show

## 2013-06-15 ENCOUNTER — Encounter: Payer: Self-pay | Admitting: Adult Health

## 2013-06-18 ENCOUNTER — Telehealth: Payer: Self-pay | Admitting: *Deleted

## 2013-06-18 ENCOUNTER — Other Ambulatory Visit: Payer: Self-pay | Admitting: *Deleted

## 2013-06-18 MED ORDER — DONEPEZIL HCL 5 MG PO TABS: 5.0000 mg | ORAL_TABLET | Freq: Every day | ORAL | Status: AC

## 2013-06-18 NOTE — Telephone Encounter (Signed)
MD reviewed [facility] nurse recommendations and new orders obtained: Aricept 5mg  PO QHS Schedule F/U 4 weeks.   Prescription sent to pharmacy.   Call placed to patient to make aware. [place].

## 2013-06-19 ENCOUNTER — Ambulatory Visit (INDEPENDENT_AMBULATORY_CARE_PROVIDER_SITE_OTHER): Payer: Medicare Other | Admitting: Family Medicine

## 2013-06-19 ENCOUNTER — Encounter: Payer: Self-pay | Admitting: Family Medicine

## 2013-06-19 VITALS — BP 124/76 | HR 70 | Temp 98.0°F | Resp 16 | Ht 66.0 in | Wt 192.0 lb

## 2013-06-19 DIAGNOSIS — Z5189 Encounter for other specified aftercare: Secondary | ICD-10-CM

## 2013-06-19 DIAGNOSIS — G459 Transient cerebral ischemic attack, unspecified: Secondary | ICD-10-CM

## 2013-06-19 DIAGNOSIS — W19XXXD Unspecified fall, subsequent encounter: Secondary | ICD-10-CM

## 2013-06-19 DIAGNOSIS — J411 Mucopurulent chronic bronchitis: Secondary | ICD-10-CM

## 2013-06-19 DIAGNOSIS — W19XXXA Unspecified fall, initial encounter: Secondary | ICD-10-CM

## 2013-06-19 DIAGNOSIS — R413 Other amnesia: Secondary | ICD-10-CM

## 2013-06-19 DIAGNOSIS — G8929 Other chronic pain: Secondary | ICD-10-CM

## 2013-06-19 DIAGNOSIS — M549 Dorsalgia, unspecified: Secondary | ICD-10-CM

## 2013-06-19 DIAGNOSIS — J418 Mixed simple and mucopurulent chronic bronchitis: Secondary | ICD-10-CM

## 2013-06-19 MED ORDER — OXYCODONE-ACETAMINOPHEN 7.5-325 MG PO TABS: 1.0000 | ORAL_TABLET | Freq: Two times a day (BID) | ORAL | Status: DC | PRN

## 2013-06-19 NOTE — Telephone Encounter (Signed)
Will discuss at next appointment. Patient has appointment on 06/19/2013 at 12:15pm.  Front desk received call from transportation reporting that patient stated her appointment was to be at 9:15am. Was advised of correct time and they are attempting to set- up transportation.

## 2013-06-19 NOTE — Patient Instructions (Signed)
Pain medication given, you can take twice a day for pain Start new memory medication Referral back to Dr. Merlene Laughter for pain management  I agree with assisted living, this is the best thing for you Change f/u to 8 weeks

## 2013-06-19 NOTE — Telephone Encounter (Signed)
Pt here for appt.

## 2013-06-20 DIAGNOSIS — R413 Other amnesia: Secondary | ICD-10-CM | POA: Insufficient documentation

## 2013-06-20 NOTE — Assessment & Plan Note (Signed)
Recurrent falls. She's currently in home physical therapy she is using a walker. We will work on the assisted-living facility

## 2013-06-20 NOTE — Progress Notes (Signed)
Patient ID: Tonya Sullivan, female   DOB: [birth year], 58 y.o.   MRN: [id number]   Subjective:    Patient ID: Tonya Sullivan, female    DOB: 10/21/1955, 58 y.o.   MRN: [id number]  Patient presents for Hospital F/U  patient here for hospital followup. She was seen in the ER recently secondary to pain in her ribs after a fall. There were no fractures however her chest x-ray was concern for possible pneumonia and due to  her medical history as well as the complexity she was treated with antibiotics. She states she's not had any cough or shortness of breath no change in sputum production. She states she's taking all her medications as prescribed. She is very upset about her pain clinic because they are not helping her. She went to a pain clinic yesterday and saw a different physician and was told that her chart has been reviewed diagnosed to run the clinic in they would not prescribe her any narcotic medications but when I give her a specific reason. She did have a severe reaction to Suboxone which landed her in the hospital she's been on high-dose narcotics secondary to multiple neck and back surgeries and chronic pain however she has been admitted for altered mental status on multiple occasions and is concerned about putting her on high-dose narcotics in the setting of all her psychiatric medications and benzodiazepine use. She does agree now that she needs further care and agrees to go into an assisted living facility which [facility] is trying to facilitate at this time. She does not have very much support from her children.    Review Of Systems:  GEN- denies fatigue, fever, weight loss,weakness, recent illness HEENT- denies eye drainage, change in vision, nasal discharge, CVS- denies chest pain, palpitations RESP- denies SOB, cough, wheeze ABD- denies N/V, change in stools, abd pain GU- denies dysuria, hematuria, dribbling, incontinence MSK- + joint pain, muscle aches, injury Neuro- denies headache,  dizziness, syncope, seizure activity       Objective:    BP 124/76  Pulse 70  Temp(Src) 98 F (36.7 C) (Oral)  Resp 16  Ht 5\' 6"  (1.676 m)  Wt 192 lb (87.091 kg)  BMI 31.00 kg/m2 GEN- NAD, alert and oriented x3 HEENT- PERRL, EOMI, non injected sclera, pink conjunctiva, MMM, oropharynx clear Neck- Supple, decreased ROM CVS- RRR, no murmur RESP-CTAB Psych- depressed flat affect, well groomed, speech normal, normal thought process EXT- No edema Pulses- Radial 2+        Assessment & Plan:      Problem List Items Addressed This Visit   None      Note: This dictation was prepared with Dragon dictation along with smaller phrase technology. Any transcriptional errors that result from this process are unintentional.

## 2013-06-20 NOTE — Assessment & Plan Note (Signed)
Her COPD is currently stable she will continue her current medications no further antibiotics needed.

## 2013-06-20 NOTE — Assessment & Plan Note (Signed)
She's had some memory problems but is difficult to tell if this is secondary to her depression and her chronic pain or she does have some underlying cognitive delay. She's been followed by her nurse at home who also notices these changes. I will go ahead and start her on Aricept 5 mg and we'll see how this affects her memory

## 2013-06-20 NOTE — Assessment & Plan Note (Signed)
We will refer her back to her previous pain clinic  Dr. Merlene Laughter, I will go ahead and start her back on Percocet 7.5 mg twice a day #60 given. Advised her that I would not put her on a high dose medications. I completely understand that she needs pain control with her history multiple surgeries however I do not want to risk of causing altered mental status with her other medication she is very high risk and needs to be under the care of a pain physician

## 2013-06-22 ENCOUNTER — Other Ambulatory Visit: Payer: Self-pay | Admitting: *Deleted

## 2013-06-22 ENCOUNTER — Encounter: Payer: Self-pay | Admitting: Adult Health

## 2013-06-22 MED ORDER — PRAVASTATIN SODIUM 40 MG PO TABS: 40.0000 mg | ORAL_TABLET | Freq: Every evening | ORAL | Status: DC

## 2013-06-22 NOTE — Progress Notes (Signed)
No show

## 2013-06-22 NOTE — Telephone Encounter (Signed)
Refill appropriate and filled per protocol. 

## 2013-06-25 ENCOUNTER — Other Ambulatory Visit: Payer: Self-pay | Admitting: *Deleted

## 2013-06-25 ENCOUNTER — Other Ambulatory Visit (HOSPITAL_COMMUNITY): Payer: Self-pay | Admitting: Psychiatry

## 2013-06-25 ENCOUNTER — Other Ambulatory Visit: Payer: Self-pay | Admitting: Family Medicine

## 2013-06-25 MED ORDER — PRAVASTATIN SODIUM 40 MG PO TABS: 40.0000 mg | ORAL_TABLET | Freq: Every evening | ORAL | Status: DC

## 2013-06-25 NOTE — Telephone Encounter (Signed)
Medication was stopped at discharge on 04/24/2013.  Refill denied.

## 2013-06-26 ENCOUNTER — Encounter (HOSPITAL_COMMUNITY): Payer: Self-pay | Admitting: Psychiatry

## 2013-06-26 ENCOUNTER — Ambulatory Visit (INDEPENDENT_AMBULATORY_CARE_PROVIDER_SITE_OTHER): Payer: Medicare Other | Admitting: Psychiatry

## 2013-06-26 VITALS — BP 90/60 | Ht 66.0 in | Wt 190.0 lb

## 2013-06-26 DIAGNOSIS — F411 Generalized anxiety disorder: Secondary | ICD-10-CM

## 2013-06-26 DIAGNOSIS — F332 Major depressive disorder, recurrent severe without psychotic features: Secondary | ICD-10-CM

## 2013-06-26 MED ORDER — DIVALPROEX SODIUM ER 500 MG PO TB24: 500.0000 mg | ORAL_TABLET | Freq: Every day | ORAL | Status: DC
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
## 2013-06-29 ENCOUNTER — Ambulatory Visit (INDEPENDENT_AMBULATORY_CARE_PROVIDER_SITE_OTHER): Payer: Medicare Other | Admitting: Psychiatry

## 2013-06-29 DIAGNOSIS — F332 Major depressive disorder, recurrent severe without psychotic features: Secondary | ICD-10-CM

## 2013-06-29 NOTE — Patient Instructions (Signed)
Discussed orally 
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
## 2013-07-06 ENCOUNTER — Ambulatory Visit: Payer: Medicare Other | Admitting: Family Medicine

## 2013-07-08 ENCOUNTER — Emergency Department (HOSPITAL_COMMUNITY)
Admission: EM | Admit: 2013-07-08 | Discharge: 2013-07-09 | Discharge status: Against Medical Advice | Payer: Medicare Other | Attending: Emergency Medicine | Admitting: Emergency Medicine

## 2013-07-08 ENCOUNTER — Encounter (HOSPITAL_COMMUNITY): Payer: Self-pay | Admitting: Emergency Medicine

## 2013-07-08 ENCOUNTER — Telehealth (INDEPENDENT_AMBULATORY_CARE_PROVIDER_SITE_OTHER): Payer: Self-pay

## 2013-07-08 DIAGNOSIS — G8929 Other chronic pain: Secondary | ICD-10-CM | POA: Insufficient documentation

## 2013-07-08 DIAGNOSIS — R05 Cough: Secondary | ICD-10-CM | POA: Diagnosis present

## 2013-07-08 DIAGNOSIS — J449 Chronic obstructive pulmonary disease, unspecified: Secondary | ICD-10-CM | POA: Insufficient documentation

## 2013-07-08 DIAGNOSIS — R059 Cough, unspecified: Secondary | ICD-10-CM | POA: Insufficient documentation

## 2013-07-08 DIAGNOSIS — R0602 Shortness of breath: Secondary | ICD-10-CM | POA: Diagnosis not present

## 2013-07-08 DIAGNOSIS — I10 Essential (primary) hypertension: Secondary | ICD-10-CM | POA: Insufficient documentation

## 2013-07-08 DIAGNOSIS — F17200 Nicotine dependence, unspecified, uncomplicated: Secondary | ICD-10-CM | POA: Diagnosis not present

## 2013-07-08 DIAGNOSIS — J4489 Other specified chronic obstructive pulmonary disease: Secondary | ICD-10-CM | POA: Insufficient documentation

## 2013-07-08 NOTE — Telephone Encounter (Signed)
Informed pt of Dr Ramirez's recommendations. Pt states that she will try these recommendations and she will call us back if she is not getting any better.

## 2013-07-08 NOTE — ED Notes (Signed)
Pt coughing up yellow, bloody mucus, started yesterday. Chest feels tight with Sob.

## 2013-07-08 NOTE — Telephone Encounter (Signed)
I would have her stay upright after eating for at least 2 hrs.  I would also suggest eating smaller, frequent meals.  If this still does not help we can try to start her on or increase her PPI.

## 2013-07-08 NOTE — Telephone Encounter (Signed)
Pt s/p lap heller myotomy in March by Dr. Rosendo Gros. Pt states that she has been regurgitating a lot here lately after she has eaten. Pt states that she has also had increased amount of nausea. Pt denies any fevers or chills. Advised pt sit up at least 30 mins after she has eaten. Informed pt that I would send Dr Rosendo Gros a message for further recommendations. Pt verbalized understanding.

## 2013-07-09 ENCOUNTER — Telehealth: Payer: Self-pay | Admitting: *Deleted

## 2013-07-09 ENCOUNTER — Emergency Department (HOSPITAL_COMMUNITY): Payer: Medicare Other

## 2013-07-09 DIAGNOSIS — R918 Other nonspecific abnormal finding of lung field: Secondary | ICD-10-CM

## 2013-07-09 DIAGNOSIS — R05 Cough: Secondary | ICD-10-CM | POA: Diagnosis not present

## 2013-07-09 DIAGNOSIS — R059 Cough, unspecified: Secondary | ICD-10-CM | POA: Diagnosis not present

## 2013-07-09 MED ORDER — GUAIFENESIN ER 600 MG PO TB12: 600.0000 mg | ORAL_TABLET | Freq: Two times a day (BID) | ORAL | Status: DC

## 2013-07-09 MED ORDER — BENZONATATE 200 MG PO CAPS: 200.0000 mg | ORAL_CAPSULE | Freq: Three times a day (TID) | ORAL | Status: DC | PRN

## 2013-07-09 NOTE — Telephone Encounter (Signed)
Message copied by Sheral Flow on Thu Jul 09, 2013 12:31 PM ------      Message from: Lenore Manner      Created: Thu Jul 09, 2013 11:12 AM      Regarding: [facility]      Contact: [phone]       Bradd Canary a nurse practitioner for [facility] has called and is wanting to speak to Dr Buelah Manis about [place] visit to the ER she states that the X Ray findings may need to be something she needs to follow up on  ------

## 2013-07-09 NOTE — Telephone Encounter (Signed)
Call returned to care manager and VM left.

## 2013-07-09 NOTE — Telephone Encounter (Signed)
Ct is concerning for malignancy.  Needs CT scan of chest with contrast.  Meanwhile start tessalon perles 200 q 8 hrs prn cough and I will notify PCP.

## 2013-07-09 NOTE — ED Provider Notes (Addendum)
Patient would not allow me to examine her. She was being disruptive of staff and other patients in the emergency room. I was tending to the critically ill and unstable patient. She attempted to distract me from the care of this patient, and demanded ice chips. She was demanding that the nurses bring her food and drinks. She is in no distress. However, she will not allow me to examine her.  She would not return to her room. She stated that if I touched her, she would "sue me".    No formal evaluation completed because of patient's refusal to let me examine her.    Patient had a chest x-ray ordered via protocol. It shows a right hilar mass or lymph node. This result returned after the pt was removed.  We have attempted to contact the patient at the information given at her check-in.  I left a message asking her to return for results of her chest x-ray. I forwarded a message to her PCP re: CXR findings.    Tanna Furry, MD 07/09/13 0501  Tanna Furry, MD 07/09/13 [phone]

## 2013-07-09 NOTE — ED Notes (Signed)
Pt continues to come in and out of room to nursing station. Explained delay. Pt states she had mucous coming from her body, no cough or symptoms seen. Pt advised, xray is done, will get report and MD will see her. Pt then yelling at MD in hallway. Security call, pt left

## 2013-07-09 NOTE — Telephone Encounter (Signed)
Received return call from [facility] requesting CT of chest and head.   CT of chest ordered by [facility].  Patient does have increased paranoia and confusion.   MD please advise.

## 2013-07-09 NOTE — Telephone Encounter (Signed)
Call placed to patient to inquire about symptoms. [place].   Noted that ER notes that patient reports having yellow and bloody colored sputum and CXR performed.   Patient left hospital before results of CXR given to MD.   ER staff attempted to contact patient in regards to CXR findings with no call back.   MD please advise.

## 2013-07-09 NOTE — Telephone Encounter (Signed)
Prescription sent to pharmacy.   Call placed to patient and patient made aware.   CT ordered.

## 2013-07-09 NOTE — ED Notes (Signed)
Pt wants to leave, upset that she had not been seen. Pt has cough, chest xray ordered. Pt back in bed, explained delay

## 2013-07-10 ENCOUNTER — Other Ambulatory Visit: Payer: Self-pay | Admitting: *Deleted

## 2013-07-10 ENCOUNTER — Telehealth (INDEPENDENT_AMBULATORY_CARE_PROVIDER_SITE_OTHER): Payer: Self-pay | Admitting: *Deleted

## 2013-07-10 MED ORDER — PANTOPRAZOLE SODIUM 40 MG PO TBEC: 40.0000 mg | DELAYED_RELEASE_TABLET | Freq: Every day | ORAL | Status: DC

## 2013-07-10 MED ORDER — TIOTROPIUM BROMIDE MONOHYDRATE 18 MCG IN CAPS: 18.0000 ug | ORAL_CAPSULE | Freq: Every day | RESPIRATORY_TRACT | Status: DC
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
## 2013-07-10 NOTE — Telephone Encounter (Signed)
Patient has appointment for Monday, 07/13/2013 for rash and CT scheduled for Tuesday.

## 2013-07-10 NOTE — ED Notes (Signed)
Pt was called and informed that she needed to make an appointment to follow up with her PCP regarding chest - ray. Pt stated she would follow up with Dr. Buelah Manis

## 2013-07-10 NOTE — Telephone Encounter (Signed)
Call placed to Care Manager. Winn.

## 2013-07-10 NOTE — Telephone Encounter (Signed)
Call Pt nurse, she does not need a CT of her head, she has had 4 CT of there head this year and MRI 3 months ago  She needs to be here on Monday, I am not wondering if the pain medication I gave her is the actual issue and I will discuss this with her Monday. I Noted her behavior in ED CT of chest okay to proceed with

## 2013-07-10 NOTE — Telephone Encounter (Signed)
Refill appropriate and filled per protocol. 

## 2013-07-11 ENCOUNTER — Encounter (HOSPITAL_COMMUNITY): Payer: Self-pay | Admitting: Emergency Medicine

## 2013-07-11 ENCOUNTER — Emergency Department (HOSPITAL_COMMUNITY)
Admission: EM | Admit: 2013-07-11 | Discharge: 2013-07-11 | Disposition: A | Discharge status: Home / Self Care | Payer: Medicare Other | Attending: Emergency Medicine | Admitting: Emergency Medicine

## 2013-07-11 DIAGNOSIS — G8929 Other chronic pain: Secondary | ICD-10-CM | POA: Insufficient documentation

## 2013-07-11 DIAGNOSIS — Z79899 Other long term (current) drug therapy: Secondary | ICD-10-CM | POA: Insufficient documentation

## 2013-07-11 DIAGNOSIS — Z791 Long term (current) use of non-steroidal anti-inflammatories (NSAID): Secondary | ICD-10-CM | POA: Insufficient documentation

## 2013-07-11 DIAGNOSIS — IMO0002 Reserved for concepts with insufficient information to code with codable children: Secondary | ICD-10-CM | POA: Diagnosis not present

## 2013-07-11 DIAGNOSIS — Z8701 Personal history of pneumonia (recurrent): Secondary | ICD-10-CM | POA: Insufficient documentation

## 2013-07-11 DIAGNOSIS — F319 Bipolar disorder, unspecified: Secondary | ICD-10-CM | POA: Insufficient documentation

## 2013-07-11 DIAGNOSIS — M545 Low back pain, unspecified: Secondary | ICD-10-CM | POA: Insufficient documentation

## 2013-07-11 DIAGNOSIS — E079 Disorder of thyroid, unspecified: Secondary | ICD-10-CM | POA: Diagnosis not present

## 2013-07-11 DIAGNOSIS — Z8739 Personal history of other diseases of the musculoskeletal system and connective tissue: Secondary | ICD-10-CM | POA: Insufficient documentation

## 2013-07-11 DIAGNOSIS — Z872 Personal history of diseases of the skin and subcutaneous tissue: Secondary | ICD-10-CM | POA: Insufficient documentation

## 2013-07-11 DIAGNOSIS — J449 Chronic obstructive pulmonary disease, unspecified: Secondary | ICD-10-CM | POA: Diagnosis not present

## 2013-07-11 DIAGNOSIS — R05 Cough: Secondary | ICD-10-CM | POA: Insufficient documentation

## 2013-07-11 DIAGNOSIS — R059 Cough, unspecified: Secondary | ICD-10-CM | POA: Diagnosis not present

## 2013-07-11 DIAGNOSIS — F17200 Nicotine dependence, unspecified, uncomplicated: Secondary | ICD-10-CM | POA: Insufficient documentation

## 2013-07-11 DIAGNOSIS — I10 Essential (primary) hypertension: Secondary | ICD-10-CM | POA: Insufficient documentation

## 2013-07-11 DIAGNOSIS — J4489 Other specified chronic obstructive pulmonary disease: Secondary | ICD-10-CM | POA: Insufficient documentation

## 2013-07-11 DIAGNOSIS — Z8673 Personal history of transient ischemic attack (TIA), and cerebral infarction without residual deficits: Secondary | ICD-10-CM | POA: Diagnosis not present

## 2013-07-11 DIAGNOSIS — Z792 Long term (current) use of antibiotics: Secondary | ICD-10-CM | POA: Diagnosis not present

## 2013-07-11 DIAGNOSIS — E785 Hyperlipidemia, unspecified: Secondary | ICD-10-CM | POA: Insufficient documentation

## 2013-07-11 DIAGNOSIS — K219 Gastro-esophageal reflux disease without esophagitis: Secondary | ICD-10-CM | POA: Insufficient documentation

## 2013-07-11 DIAGNOSIS — F411 Generalized anxiety disorder: Secondary | ICD-10-CM | POA: Diagnosis not present

## 2013-07-11 MED ORDER — OXYCODONE-ACETAMINOPHEN 7.5-325 MG PO TABS: 1.0000 | ORAL_TABLET | ORAL | Status: DC | PRN

## 2013-07-11 NOTE — Discharge Instructions (Signed)
Chronic Back Pain  When back pain lasts longer than 3 months, it is called chronic back pain.People with chronic back pain often go through certain periods that are more intense (flare-ups).  CAUSES Chronic back pain can be caused by wear and tear (degeneration) on different structures in your back. These structures include:  The bones of your spine (vertebrae) and the joints surrounding your spinal cord and nerve roots (facets).  The strong, fibrous tissues that connect your vertebrae (ligaments). Degeneration of these structures may result in pressure on your nerves. This can lead to constant pain. HOME CARE INSTRUCTIONS  Avoid bending, heavy lifting, prolonged sitting, and activities which make the problem worse.  Take brief periods of rest throughout the day to reduce your pain. Lying down or standing usually is better than sitting while you are resting.  Take over-the-counter or prescription medicines only as directed by your caregiver. SEEK IMMEDIATE MEDICAL CARE IF:   You have weakness or numbness in one of your legs or feet.  You have trouble controlling your bladder or bowels.  You have nausea, vomiting, abdominal pain, shortness of breath, or fainting. Document Released: 01/26/2004 Document Revised: 03/12/2011 Document Reviewed: 12/02/2010 [facility] Patient Information 2015 [place], [place]. This information is not intended to replace advice given to you by your health care provider. Make sure you discuss any questions you have with your health care provider.    You may take the oxycodone prescribed for pain relief.  This will make you drowsy - do not drive within 4 hours of taking this medication. Keep your appointment for your chest CT scan on Tuesday as this is very important.  Further chronic pain medications need to come from your primary doctor, not the emergency department.

## 2013-07-11 NOTE — ED Notes (Signed)
Patient with c/o pain medication request. States she wants pain medication for chronic back pain. Patient prescribed percocet 7.5/325 mg by Dr Buelah Manis on 6/19, quantity 60 in which she was to take 1 tab BID for pain. Patient has 1 tablet left.

## 2013-07-11 NOTE — ED Provider Notes (Signed)
CSN: [id number]     Arrival date & time 07/11/13  0805 History   First MD Initiated Contact with Patient 07/11/13 [phone]     Chief Complaint  Patient presents with  . Back Pain     (Consider location/radiation/quality/duration/timing/severity/associated sxs/prior Treatment) Patient is a 58 y.o. female presenting with back pain.  Back Pain Associated symptoms: no abdominal pain, no chest pain, no dysuria, no fever, no numbness and no weakness      Ananiah A Biggar is a 58 y.o. female presenting for assistance with her chronic low back pain.  She describes acute on chronic pain for which she is taking percocet 7.5 mg tablets prescribed by her pcp.  She allowed a neighbor in her apartment yesterday and has discovered she now only has one tablet left in her bottle and is concerned about pain control for the rest of the weekend.  She was seen here 3 nights ago for coughing and yellow and blood tinged sputum production during which time she left ama, but not before a cxr was obtained indication a lung mass, possibly lung ca but no evidence of pneumonia.  She has since been scheduled by her pcp for a CT chest in 3 days and will f/u with an office visit in 2 days with the pcp.  She denies any worsened back pain, also denies any new weakness or numbness in the lower extremities and no urinary or bowel retention or incontinence.  She denies any increased shortness of breath and denies chest pain.   Past Medical History  Diagnosis Date  . COPD (chronic obstructive pulmonary disease)   . Depression   . Chronic back pain     DDD, disc bulge, radiculopathy, spinal stenosis  . Hyperlipidemia   . Anxiety   . Bipolar disorder, unspecified   . Carotid artery calcification   . DDD (degenerative disc disease), lumbosacral   . Spinal stenosis, lumbar   . Thyroid disease   . Autoimmune thyroiditis   . Hypertension   . TIA (transient ischemic attack)     august 2014  . GERD (gastroesophageal reflux disease)    . H/O dizziness   . Neuropathic pain   . Adrenal gland dysfunction     Addison's disease ON DAILY PREDNISONE  . Addison's disease   . Stroke     HX OF MINI STROKE AUG 2014- DROPPIN EVERYTHING, SPEECH SLURRED, MEMORY AFFECTED --ALL RESOLVED - NO DEFICITS NOW  . Pneumonia     LAST PNEUMONIA WAS AUG 2014  . Achalasia   . Mouth ulcers   . Dysrhythmia     palpitations  . Melanoma     removed 1 week ago at Dr Teryl Lucy office  . Addison's disease   . Frequent falls    Past Surgical History  Procedure Laterality Date  . Appendectomy    . Cholecystectomy    . Lump left breast      removed- benign  . Abdominal hysterectomy      tubal pregnancy  . Inguinal hernia repair    . Stomach surgery      ?holes in esophgous  . Ectopic pregnancy surgery    . Back surgery      x 5;1984;1989;;1999;2000;2010  . Colonoscopy  01/25/2012    Procedure: COLONOSCOPY;  Surgeon: Rogene Houston, MD;  Location: [facility];  Service: Endoscopy;  Laterality: N/A;  130  . Esophagogastroduodenoscopy (egd) with esophageal dilation N/A 08/13/2012    Procedure: ESOPHAGOGASTRODUODENOSCOPY (EGD) WITH ESOPHAGEAL DILATION;  Surgeon:  Rogene Houston, MD;  Location: [facility];  Service: Endoscopy;  Laterality: N/A;  315  . Anterior cervical decomp/discectomy fusion N/A 10/21/2012    Procedure: Cervical Six-Seven Anterior cervical decompression/diskectomy/fusion;  Surgeon: Kristeen Miss, MD;  Location: [facility];  Service: Neurosurgery;  Laterality: N/A;  Cervical Six-Seven Anterior cervical decompression/diskectomy/fusion  . Cataract extraction w/phaco Right 11/25/2012    Procedure: CATARACT EXTRACTION PHACO AND INTRAOCULAR LENS PLACEMENT (IOC);  Surgeon: Elta Guadeloupe T. Gershon Crane, MD;  Location: [facility];  Service: Ophthalmology;  Laterality: Right;  CDE:10.06  . Cataract extraction w/phaco Left 12/09/2012    Procedure: CATARACT EXTRACTION PHACO AND INTRAOCULAR LENS PLACEMENT (IOC);  Surgeon: Elta Guadeloupe T. Gershon Crane, MD;  Location:  [facility];  Service: Ophthalmology;  Laterality: Left;  CDE:5.06  . Tonsillectomy    . Hiatal hernia repair    . Heller myotomy N/A 03/17/2013    Procedure: DIAGNOSTIC LAPAROSCOPY, LAPAROSCOPIC HELLER MYOTOMY, ENDOSCOPY, DOR FUNDOPLICATION;  Surgeon: Ralene Ok, MD;  Location: [facility];  Service: General;  Laterality: N/A;  . Breast surgery      left breast  . Eye surgery     Family History  Problem Relation Age of Onset  . Hypertension Mother   . Heart disease Mother   . Heart attack Mother   . Anxiety disorder Mother   . Cancer Father     lung cancer  . Heart attack Father   . Alcohol abuse Father   . Hypertension Brother   . Alcohol abuse Brother   . Bipolar disorder Brother   . ADD / ADHD Brother   . Drug abuse Brother   . OCD Brother   . Alcohol abuse Sister   . Bipolar disorder Sister   . Anxiety disorder Sister   . ADD / ADHD Sister   . Drug abuse Sister   . Physical abuse Sister   . Sexual abuse Sister   . Alcohol abuse Brother   . Bipolar disorder Brother   . ADD / ADHD Brother   . Drug abuse Brother   . Alcohol abuse Sister   . Bipolar disorder Sister   . Anxiety disorder Sister   . ADD / ADHD Sister   . Physical abuse Sister   . Sexual abuse Sister   . Alcohol abuse Brother   . Bipolar disorder Brother   . ADD / ADHD Brother   . Bipolar disorder Brother   . ADD / ADHD Brother   . Alcohol abuse Brother   . Paranoid behavior Brother   . Physical abuse Brother   . Sexual abuse Brother   . Bipolar disorder Maternal Aunt   . Bipolar disorder Paternal Aunt   . Bipolar disorder Maternal Uncle   . Bipolar disorder Paternal Uncle   . Bipolar disorder Maternal Grandfather   . Alcohol abuse Maternal Grandfather   . Bipolar disorder Maternal Grandmother   . Alcohol abuse Maternal Grandmother   . Anxiety disorder Maternal Grandmother   . Dementia Maternal Grandmother   . Bipolar disorder Paternal Grandfather   . Alcohol abuse Paternal Grandfather   .  Bipolar disorder Paternal Grandmother   . Alcohol abuse Paternal Grandmother   . Drug abuse Paternal Grandmother   . Bipolar disorder Maternal Uncle   . Schizophrenia Neg Hx   . Seizures Neg Hx    History  Substance Use Topics  . Smoking status: Current Some Day Smoker -- 0.25 packs/day for 40 years    Types: Cigarettes    Last Attempt to  Quit: 03/05/2013  . Smokeless tobacco: Never Used     Comment: 5 cigs day 03/12/13  . Alcohol Use: No   OB History   Grav Para Term Preterm Abortions TAB SAB Ect Mult Living   2 1 1  1   1  1      Review of Systems  Constitutional: Negative for fever.  Respiratory: Positive for cough. Negative for shortness of breath.   Cardiovascular: Negative for chest pain and leg swelling.  Gastrointestinal: Negative for abdominal pain, constipation and abdominal distention.  Genitourinary: Negative for dysuria, urgency, frequency, flank pain and difficulty urinating.  Musculoskeletal: Positive for back pain. Negative for gait problem and joint swelling.  Skin: Negative for rash.  Neurological: Negative for weakness and numbness.      Allergies  Codeine; Cyclobenzaprine; Darvocet; Abilify; Ace inhibitors; Adhesive; Gabapentin; Iodine; Levofloxacin; Prednisone; Sulfa antibiotics; and Robaxin  Home Medications   Prior to Admission medications   Medication Sig Start Date End Date Taking? Authorizing Provider  albuterol (PROVENTIL HFA;VENTOLIN HFA) 108 (90 BASE) MCG/ACT inhaler Inhale 2 puffs into the lungs 3 (three) times daily as needed for wheezing or shortness of breath. 05/26/13   Nena Polio, PA-C  albuterol (PROVENTIL) (2.5 MG/3ML) 0.083% nebulizer solution Take 3 mLs (2.5 mg total) by nebulization every 4 (four) hours as needed for wheezing. 05/26/13   Nena Polio, PA-C  amitriptyline (ELAVIL) 100 MG tablet Take 1 tablet (100 mg total) by mouth at bedtime. 05/29/13   Levonne Spiller, MD  baclofen (LIORESAL) 10 MG tablet Take 1 tablet (10 mg total) by  mouth daily. 05/26/13   Nena Polio, PA-C  benzonatate (TESSALON) 200 MG capsule Take 1 capsule (200 mg total) by mouth 3 (three) times daily as needed for cough. 07/09/13   Susy Frizzle, MD  clonazePAM (KLONOPIN) 1 MG tablet Take 1 tablet (1 mg total) by mouth 4 (four) times daily. 05/29/13   Levonne Spiller, MD  Cyanocobalamin (VITAMIN B 12 PO) Take 1 tablet by mouth daily.    Historical Provider, MD  divalproex (DEPAKOTE ER) 500 MG 24 hr tablet Take 1 tablet (500 mg total) by mouth daily. 06/26/13   Levonne Spiller, MD  donepezil (ARICEPT) 5 MG tablet Take 1 tablet (5 mg total) by mouth at bedtime. 06/18/13   Alycia Rossetti, MD  doxycycline (VIBRA-TABS) 100 MG tablet Take 1 tablet (100 mg total) by mouth 2 (two) times daily. 06/11/13   Alfonzo Feller, DO  DULoxetine (CYMBALTA) 60 MG capsule Take 1 capsule (60 mg total) by mouth every morning. 05/29/13   Levonne Spiller, MD  guaiFENesin (MUCINEX) 600 MG 12 hr tablet Take 1 tablet (600 mg total) by mouth 2 (two) times daily. 07/09/13   Susy Frizzle, MD  lidocaine (LIDODERM) 5 % Place 1 patch onto the skin every 12 (twelve) hours. 05/26/13   Nena Polio, PA-C  metoprolol tartrate (LOPRESSOR) 25 MG tablet Take 1 tablet (25 mg total) by mouth 2 (two) times daily with a meal. 05/26/13   Nena Polio, PA-C  Multiple Vitamins-Minerals (HAIR/SKIN/NAILS/BIOTIN PO) Take 1 tablet by mouth daily.    Historical Provider, MD  oxybutynin (DITROPAN XL) 10 MG 24 hr tablet Take 1 tablet (10 mg total) by mouth daily. 05/26/13   Nena Polio, PA-C  oxyCODONE-acetaminophen (PERCOCET) 7.5-325 MG per tablet Take 1 tablet by mouth 2 (two) times daily as needed for pain. 06/19/13   Alycia Rossetti, MD  oxyCODONE-acetaminophen (PERCOCET) 7.5-325 MG per tablet Take 1 tablet by mouth  every 4 (four) hours as needed for pain. 07/11/13   Evalee Jefferson, PA-C  pantoprazole (PROTONIX) 40 MG tablet Take 1 tablet (40 mg total) by mouth daily. 07/10/13   Alycia Rossetti, MD  pravastatin  (PRAVACHOL) 40 MG tablet Take 1 tablet (40 mg total) by mouth every evening. 06/25/13   Alycia Rossetti, MD  predniSONE (DELTASONE) 2.5 MG tablet Take 3 tablets (7.5 mg total) by mouth daily with breakfast. 05/26/13   Nena Polio, PA-C  tiotropium (SPIRIVA) 18 MCG inhalation capsule Place 1 capsule (18 mcg total) into inhaler and inhale daily. 07/10/13   Alycia Rossetti, MD   There were no vitals taken for this visit. Physical Exam  ED Course  Procedures (including critical care time) Labs Review Labs Reviewed - No data to display  Imaging Review No results found.   EKG Interpretation None      MDM   Final diagnoses:  Chronic low back pain    Past chart reviewed.  PT is scheduled for her chest CT scan in 3 days which she is aware,  Also to f/u with pcp in 2 days.  She was given percocet #5 tablets.  Advised further chronic pain medicine needs to be managed by pcp, not the ed.  She understands.  VSS.  Pt is stable at time of dc.  No neuro deficit on exam or by history to suggest emergent or surgical presentation.  Also discussed worsened sx that should prompt immediate re-evaluation including distal weakness, bowel/bladder retention/incontinence.          Evalee Jefferson, PA-C 07/11/13 [phone]

## 2013-07-11 NOTE — ED Notes (Signed)
Patient demanding to have DR [facility] called immediately. Patient informed that emergency MD must evaluate her.

## 2013-07-12 NOTE — ED Provider Notes (Signed)
Medical screening examination/treatment/procedure(s) were performed by non-physician practitioner and as supervising physician I was immediately available for consultation/collaboration.   EKG Interpretation None       Nat Christen, MD 07/12/13 [phone]

## 2013-07-13 ENCOUNTER — Ambulatory Visit: Payer: Medicare Other | Admitting: Family Medicine

## 2013-07-13 ENCOUNTER — Telehealth: Payer: Self-pay | Admitting: *Deleted

## 2013-07-13 NOTE — Telephone Encounter (Signed)
Call placed to patient care manager. [place].

## 2013-07-13 NOTE — Telephone Encounter (Signed)
Returned call to [employer].   States that patient has had increase in paranoia, confusion and anxiety. Reports that he is unsure if patient is taking medications correctly, but she is supposed to be taking high potency medications for psychiatric care.   Reports that patient has trouble remembering her medication schedule, and is showing anxiety by chain smoking during [facility] visits.   Reports that patient does have aide that comes to home 2 hours per day x7 days per week, but patient continues to show difficulty with daily functions, such as food preparation and medication administration. States that she is constantly reporting back pain, and is usually wearing back brace while at home.   Requested MD to advise on possible placement in SNF or group home d/t high acuity.   Also was inquiring about ER visit with CXR results.   Advised that patient was made aware of results and CT scan was scheduled. SW had no knowledge of appointment. Made aware that appointment is scheduled for 07/14/2013 @ 2:30pm, but patient needs to come by office for contrast first. Transportation will be scheduled by [place].   MD to be made aware.

## 2013-07-13 NOTE — Telephone Encounter (Signed)
Message copied by Sheral Flow on Mon Jul 13, 2013  4:54 PM ------      Message from: Lenore Manner      Created: Mon Jul 13, 2013  4:09 PM      Regarding: Buck Mam      Contact: [phone]       Nicki Reaper has called and left a message stating that the pt is needing a higher level a care, she is have trouble with keeping up with medications, eating, and her apts. She is calling Event organiser and [facility] Canary multiple times a week. She is also having some behavior issus as well with interaction with ppl and anger issues. If you could please call scott back  ------

## 2013-07-13 NOTE — Telephone Encounter (Signed)
Pt called today 'irritated" d/t to having to reschedule her appointment for today d/t provider out sick, pt was not happy that we had to cancel her appointment, Pt states that she is throwing up bile and needs to be seen. I advised pt that if she is throwing up bile then she needs to go to ED for evaluation, pt states that is what she is trying to avoid and wants to be seen today. I told pt that I can try to put her on schedule to see another provider but she doesn't want to see no one else except her own PCP. I told her that I could try to put her on schedule for tomorrow with her provider and claims tomorrow is too late. I told pt this is the best I could do and if needed to see someone she needs to go to ED. Pt says she is not happy.

## 2013-07-14 ENCOUNTER — Other Ambulatory Visit: Payer: Self-pay | Admitting: Family Medicine

## 2013-07-14 ENCOUNTER — Ambulatory Visit (HOSPITAL_COMMUNITY)
Admission: RE | Admit: 2013-07-14 | Discharge: 2013-07-14 | Disposition: A | Discharge status: Home / Self Care | Payer: Medicare Other | Source: Ambulatory Visit | Attending: Family Medicine | Admitting: Family Medicine

## 2013-07-14 ENCOUNTER — Other Ambulatory Visit: Payer: Self-pay | Admitting: *Deleted

## 2013-07-14 DIAGNOSIS — J984 Other disorders of lung: Secondary | ICD-10-CM | POA: Insufficient documentation

## 2013-07-14 DIAGNOSIS — R059 Cough, unspecified: Secondary | ICD-10-CM | POA: Diagnosis present

## 2013-07-14 DIAGNOSIS — R222 Localized swelling, mass and lump, trunk: Secondary | ICD-10-CM | POA: Diagnosis not present

## 2013-07-14 DIAGNOSIS — R599 Enlarged lymph nodes, unspecified: Secondary | ICD-10-CM | POA: Insufficient documentation

## 2013-07-14 DIAGNOSIS — R05 Cough: Secondary | ICD-10-CM | POA: Diagnosis present

## 2013-07-14 DIAGNOSIS — F17200 Nicotine dependence, unspecified, uncomplicated: Secondary | ICD-10-CM

## 2013-07-14 DIAGNOSIS — R918 Other nonspecific abnormal finding of lung field: Secondary | ICD-10-CM

## 2013-07-14 LAB — POCT I-STAT CREATININE: CREATININE: 1 mg/dL (ref 0.50–1.10)

## 2013-07-14 MED ORDER — IOHEXOL 300 MG/ML  SOLN
100.0000 mL | Freq: Once | INTRAMUSCULAR | Status: AC | PRN
  Administered 2013-07-14: 100 mL via INTRAVENOUS

## 2013-07-14 MED ORDER — SODIUM CHLORIDE 0.9 % IJ SOLN
INTRAMUSCULAR | Status: AC
  Filled 2013-07-14: qty 500

## 2013-07-14 NOTE — Telephone Encounter (Signed)
Please go ahead and fill out FL2- I will speak to Tonya Sullivan She has been in SNF at least 2-3 times and has gone home as she is declared competent, I do think she needs to be placed

## 2013-07-14 NOTE — Telephone Encounter (Signed)
FL2 completed.

## 2013-07-15 ENCOUNTER — Encounter: Payer: Self-pay | Admitting: Family Medicine

## 2013-07-15 ENCOUNTER — Ambulatory Visit (INDEPENDENT_AMBULATORY_CARE_PROVIDER_SITE_OTHER): Payer: Medicare Other | Admitting: Family Medicine

## 2013-07-15 VITALS — BP 128/76 | HR 88 | Temp 98.4°F | Resp 16

## 2013-07-15 DIAGNOSIS — G459 Transient cerebral ischemic attack, unspecified: Secondary | ICD-10-CM

## 2013-07-15 DIAGNOSIS — Z79899 Other long term (current) drug therapy: Secondary | ICD-10-CM

## 2013-07-15 DIAGNOSIS — F3162 Bipolar disorder, current episode mixed, moderate: Secondary | ICD-10-CM

## 2013-07-15 DIAGNOSIS — R918 Other nonspecific abnormal finding of lung field: Secondary | ICD-10-CM | POA: Insufficient documentation

## 2013-07-15 DIAGNOSIS — M549 Dorsalgia, unspecified: Secondary | ICD-10-CM

## 2013-07-15 DIAGNOSIS — I10 Essential (primary) hypertension: Secondary | ICD-10-CM

## 2013-07-15 DIAGNOSIS — R222 Localized swelling, mass and lump, trunk: Secondary | ICD-10-CM

## 2013-07-15 DIAGNOSIS — F332 Major depressive disorder, recurrent severe without psychotic features: Secondary | ICD-10-CM

## 2013-07-15 DIAGNOSIS — G8929 Other chronic pain: Secondary | ICD-10-CM

## 2013-07-15 DIAGNOSIS — F4310 Post-traumatic stress disorder, unspecified: Secondary | ICD-10-CM

## 2013-07-15 DIAGNOSIS — E785 Hyperlipidemia, unspecified: Secondary | ICD-10-CM
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
## 2013-07-15 NOTE — Assessment & Plan Note (Signed)
She needs to be seen by pain clinic, I will not give any pain meds today

## 2013-07-15 NOTE — Patient Instructions (Signed)
We will call with labs  Pain clinic to be looked at  Referral to CT surgeon for the lung mass We will work to get into [facility]  F/U pending

## 2013-07-15 NOTE — Assessment & Plan Note (Signed)
She would benefit from skilled nursing especially with the new probable diagnosis of lung cancer, she needs transporation, medication dispensal, close follow-up , assistance with ADLS, she agrees to SNF today

## 2013-07-15 NOTE — Assessment & Plan Note (Signed)
Appt set up with CT surgery for biopsy, will arrange through [facility]

## 2013-07-15 NOTE — Assessment & Plan Note (Signed)
I have also considered getting her psychiatrist to evaluate her compentency if we have difficulty with her getting into group home or SNF

## 2013-07-15 NOTE — Assessment & Plan Note (Signed)
Well controlled, on prednisone for adrenal insufficiency

## 2013-07-16 ENCOUNTER — Encounter (INDEPENDENT_AMBULATORY_CARE_PROVIDER_SITE_OTHER): Payer: Self-pay | Admitting: Internal Medicine

## 2013-07-16 ENCOUNTER — Ambulatory Visit (INDEPENDENT_AMBULATORY_CARE_PROVIDER_SITE_OTHER): Payer: Medicare Other | Admitting: Internal Medicine

## 2013-07-16 VITALS — BP 100/72 | HR 72 | Temp 97.0°F | Ht 67.0 in | Wt 194.2 lb

## 2013-07-16 DIAGNOSIS — R918 Other nonspecific abnormal finding of lung field: Secondary | ICD-10-CM

## 2013-07-16 DIAGNOSIS — R222 Localized swelling, mass and lump, trunk: Secondary | ICD-10-CM | POA: Diagnosis not present

## 2013-07-16 DIAGNOSIS — K219 Gastro-esophageal reflux disease without esophagitis: Secondary | ICD-10-CM | POA: Diagnosis not present

## 2013-07-16 DIAGNOSIS — G459 Transient cerebral ischemic attack, unspecified: Secondary | ICD-10-CM

## 2013-07-16 LAB — COMPREHENSIVE METABOLIC PANEL
ALK PHOS: 52 U/L (ref 39–117)
ALT: <8 U/L (ref 0–35)
AST: 11 U/L (ref 0–37)
Albumin: 3.7 g/dL (ref 3.5–5.2)
BUN: 8 mg/dL (ref 6–23)
CHLORIDE: 94 meq/L — AB (ref 96–112)
CO2: 30 mEq/L (ref 19–32)
CREATININE: 0.86 mg/dL (ref 0.50–1.10)
Calcium: 9.1 mg/dL (ref 8.4–10.5)
Glucose, Bld: 89 mg/dL (ref 70–99)
Potassium: 5 mEq/L (ref 3.5–5.3)
Sodium: 131 mEq/L — ABNORMAL LOW (ref 135–145)
Total Bilirubin: 0.3 mg/dL (ref 0.2–1.2)
Total Protein: 6.5 g/dL (ref 6.0–8.3)

## 2013-07-16 LAB — CBC WITH DIFFERENTIAL/PLATELET
BASOS ABS: 0 10*3/uL (ref 0.0–0.1)
Basophils Relative: 0 % (ref 0–1)
EOS PCT: 1 % (ref 0–5)
Eosinophils Absolute: 0.1 10*3/uL (ref 0.0–0.7)
HCT: 42.2 % (ref 36.0–46.0)
Hemoglobin: 14 g/dL (ref 12.0–15.0)
LYMPHS PCT: 30 % (ref 12–46)
Lymphs Abs: 2 10*3/uL (ref 0.7–4.0)
MCH: 29.5 pg (ref 26.0–34.0)
MCHC: 33.2 g/dL (ref 30.0–36.0)
MCV: 89 fL (ref 78.0–100.0)
Monocytes Absolute: 0.6 10*3/uL (ref 0.1–1.0)
Monocytes Relative: 9 % (ref 3–12)
NEUTROS ABS: 4.1 10*3/uL (ref 1.7–7.7)
Neutrophils Relative %: 60 % (ref 43–77)
PLATELETS: 290 10*3/uL (ref 150–400)
RBC: 4.74 MIL/uL (ref 3.87–5.11)
RDW: 14.2 % (ref 11.5–15.5)
WBC: 6.8 10*3/uL (ref 4.0–10.5)

## 2013-07-16 LAB — LIPID PANEL
CHOL/HDL RATIO: 2.2 ratio
Cholesterol: 163 mg/dL (ref 0–200)
HDL: 73 mg/dL (ref >39)
LDL CALC: 68 mg/dL (ref 0–99)
Triglycerides: 110 mg/dL (ref <150)
VLDL: 22 mg/dL (ref 0–40)

## 2013-07-16 LAB — TSH: TSH: 0.768 u[IU]/mL (ref 0.350–4.500)

## 2013-07-16 NOTE — Progress Notes (Signed)
Subjective:     Patient ID: Tonya Sullivan, female   DOB: 09-12-1955, 58 y.o.   MRN: [id number]  HPI Presents today with c/o that at night bile is coming up into her esophagus.  Has been occurring for about 2 weeks. She says this actually occurs day and night.  She tells me she is having acid reflux frequently. The Protonix is really not helping her.  Her last meal of the day is around 6 pm.  She says she really does not have much of an appetite. She thinks she may have lost about 10 pounds over the past week.  She usually has one BM a day. She denies having any abdominal pain.  She says she had diarrhea yesterday and today. Her stools are loose. She has had 3 stools today. No melena or bright red rectal bleeding. No recent antibiotics. Recently seen in the ED for a cough. CT scan revealed: see below. She is scheduled for a lung biopsy in [facility] by Dr. Cyndia Bent  07/09/2012 CT chest w CM:  IMPRESSION:  Large right hilar mass with evidence of local extension/ adenopathy  the anterior to the carina. Additionally a peripheral 12 mm lesion  is noted within the right upper lobe. 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 Tissue sampling is recommended.  8/13./2014 EGD: dysphagia: Impression:  No evidence of esophagitis or stricture at GE junction.  Short fundal wrap.  Nonerosive antral gastritis with a small scar.  Esophagus dilated by passing 54 and 56 French Maloney dilators but no mucosal disruption noted.  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  If she does not respond to esophageal dilation will consider esophageal manometry.   01/25/2012 Colonoscopy: Impression:  Examination performed to cecum.  Mild sigmoid colon diverticulosis otherwise normal colonoscopy.  Recommendations:    Review of Systems Past Medical History  Diagnosis Date  . COPD (chronic obstructive pulmonary disease)   . Depression   . Chronic back pain     DDD, disc bulge,  radiculopathy, spinal stenosis  . Hyperlipidemia   . Anxiety   . Bipolar disorder, unspecified   . Carotid artery calcification   . DDD (degenerative disc disease), lumbosacral   . Spinal stenosis, lumbar   . Thyroid disease   . Autoimmune thyroiditis   . Hypertension   . TIA (transient ischemic attack)     august 2014  . GERD (gastroesophageal reflux disease)   . H/O dizziness   . Neuropathic pain   . Adrenal gland dysfunction     Addison's disease ON DAILY PREDNISONE  . Addison's disease   . Stroke     HX OF MINI STROKE AUG 2014- DROPPIN EVERYTHING, SPEECH SLURRED, MEMORY AFFECTED --ALL RESOLVED - NO DEFICITS NOW  . Pneumonia     LAST PNEUMONIA WAS AUG 2014  . Achalasia   . Mouth ulcers   . Dysrhythmia     palpitations  . Melanoma     removed 1 week ago at Dr Teryl Lucy office  . Addison's disease   . Frequent falls   . 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     Past Surgical History  Procedure Laterality Date  . Appendectomy    . Cholecystectomy    . Lump left breast      removed- benign  . Abdominal hysterectomy      tubal pregnancy  . Inguinal hernia repair    . Stomach surgery      ?  holes in esophgous  . Ectopic pregnancy surgery    . Back surgery      x 5;1984;1989;;1999;2000;2010  . Colonoscopy  01/25/2012    Procedure: COLONOSCOPY;  Surgeon: Rogene Houston, MD;  Location: [facility];  Service: Endoscopy;  Laterality: N/A;  130  . Esophagogastroduodenoscopy (egd) with esophageal dilation N/A 08/13/2012    Procedure: ESOPHAGOGASTRODUODENOSCOPY (EGD) WITH ESOPHAGEAL DILATION;  Surgeon: Rogene Houston, MD;  Location: [facility];  Service: Endoscopy;  Laterality: N/A;  315  . Anterior cervical decomp/discectomy fusion N/A 10/21/2012    Procedure: Cervical Six-Seven Anterior cervical decompression/diskectomy/fusion;  Surgeon: Kristeen Miss, MD;  Location: [facility];  Service: Neurosurgery;  Laterality: N/A;  Cervical Six-Seven  Anterior cervical decompression/diskectomy/fusion  . Cataract extraction w/phaco Right 11/25/2012    Procedure: CATARACT EXTRACTION PHACO AND INTRAOCULAR LENS PLACEMENT (IOC);  Surgeon: Elta Guadeloupe T. Gershon Crane, MD;  Location: [facility];  Service: Ophthalmology;  Laterality: Right;  CDE:10.06  . Cataract extraction w/phaco Left 12/09/2012    Procedure: CATARACT EXTRACTION PHACO AND INTRAOCULAR LENS PLACEMENT (IOC);  Surgeon: Elta Guadeloupe T. Gershon Crane, MD;  Location: [facility];  Service: Ophthalmology;  Laterality: Left;  CDE:5.06  . Tonsillectomy    . Hiatal hernia repair    . Heller myotomy N/A 03/17/2013    Procedure: DIAGNOSTIC LAPAROSCOPY, LAPAROSCOPIC HELLER MYOTOMY, ENDOSCOPY, DOR FUNDOPLICATION;  Surgeon: Ralene Ok, MD;  Location: [facility];  Service: General;  Laterality: N/A;  . Breast surgery      left breast  . Eye surgery      Allergies  Allergen Reactions  . Codeine Itching  . Cyclobenzaprine Hives  . Darvocet [Propoxyphene N-Acetaminophen] Rash  . Abilify [Aripiprazole] Other (See Comments)    tremors  . Ace Inhibitors Other (See Comments)    unknown  . Adhesive [Tape] Hives  . Gabapentin Swelling  . Iodine Hives  . Levofloxacin Hives and Hypertension  . Prednisone Hives, Itching and Hypertension  . Sulfa Antibiotics Itching  . Robaxin [Methocarbamol] Rash    Current Outpatient Prescriptions on File Prior to Visit  Medication Sig Dispense Refill  . albuterol (PROVENTIL HFA;VENTOLIN HFA) 108 (90 BASE) MCG/ACT inhaler Inhale 2 puffs into the lungs 3 (three) times daily as needed for wheezing or shortness of breath.      Marland Kitchen albuterol (PROVENTIL) (2.5 MG/3ML) 0.083% nebulizer solution Take 3 mLs (2.5 mg total) by nebulization every 4 (four) hours as needed for wheezing.  75 mL  12  . amitriptyline (ELAVIL) 100 MG tablet Take 1 tablet (100 mg total) by mouth at bedtime.  30 tablet  2  . baclofen (LIORESAL) 10 MG tablet Take 1 tablet (10 mg total) by mouth daily.  30 each  0  . benzonatate  (TESSALON) 200 MG capsule Take 1 capsule (200 mg total) by mouth 3 (three) times daily as needed for cough.  20 capsule  0  . clonazePAM (KLONOPIN) 1 MG tablet Take 1 tablet (1 mg total) by mouth 4 (four) times daily.  120 tablet  2  . Cyanocobalamin (VITAMIN B 12 PO) Take 1 tablet by mouth daily.      . divalproex (DEPAKOTE ER) 500 MG 24 hr tablet Take 1 tablet (500 mg total) by mouth daily.  30 tablet  1  . donepezil (ARICEPT) 5 MG tablet Take 1 tablet (5 mg total) by mouth at bedtime.  30 tablet  3  . DULoxetine (CYMBALTA) 60 MG capsule Take 1 capsule (60 mg total) by mouth every morning.  30 capsule  2  . guaiFENesin (MUCINEX) 600 MG 12 hr tablet Take 1 tablet (600 mg total) by mouth 2 (two) times daily.  30 tablet  0  . lidocaine (LIDODERM) 5 % Place 1 patch onto the skin every 12 (twelve) hours.  30 patch  0  . metoprolol tartrate (LOPRESSOR) 25 MG tablet Take 1 tablet (25 mg total) by mouth 2 (two) times daily with a meal.  60 tablet  0  . Multiple Vitamins-Minerals (HAIR/SKIN/NAILS/BIOTIN PO) Take 1 tablet by mouth daily.      Marland Kitchen oxyCODONE-acetaminophen (PERCOCET) 7.5-325 MG per tablet Take 1 tablet by mouth 2 (two) times daily as needed for pain.  60 tablet  0  . pantoprazole (PROTONIX) 40 MG tablet Take 1 tablet (40 mg total) by mouth daily.  30 tablet  3  . pravastatin (PRAVACHOL) 40 MG tablet Take 1 tablet (40 mg total) by mouth every evening.  30 tablet  2  . predniSONE (DELTASONE) 2.5 MG tablet Take 3 tablets (7.5 mg total) by mouth daily with breakfast.      . tiotropium (SPIRIVA) 18 MCG inhalation capsule Place 1 capsule (18 mcg total) into inhaler and inhale daily.  30 capsule  12  . [DISCONTINUED] Gabapentin, PHN, (GRALISE STARTER) 300 & 600 MG MISC Take 300-1,800 mg by mouth as directed. 15 DAY Starter pack doses     DAY 1- 300 mg     DAY 2- 600 mg     DAY 3 to 6 - 900 mg    DAY 7 to 10 - 1200 mg     DAY 11 to 14 - 1500 mg      DAY 15 - 1800 mg        No current  facility-administered medications on file prior to visit.        Objective:   Physical Exam   Filed Vitals:   07/16/13 1558  BP: 100/72  Pulse: 72  Temp: 97 F (36.1 C)  Height: 5\' 7"  (1.702 m)  Weight: 194 lb 3.2 oz (88.089 kg)  Alert and oriented. Skin warm and dry. Oral mucosa is moist.   . Sclera anicteric, conjunctivae is pink. Thyroid not enlarged. No cervical lymphadenopathy. Bilateral rhonchi noted.  Heart regular rate and rhythm.  Abdomen is soft. Bowel sounds are positive. No hepatomegaly. No abdominal masses felt. No tenderness.  No edema to lower extremities.         Assessment:     GERD. Not controlled at this time. She says the Protonix is not helping.  Some of her symptoms may be coming from her airway.     Plan:     Dexilant samples given to patient to try.  PR in 2 weeks Elevate head of bed. Do not eat after 7 pm.    Encouraged to eat 3 meals a day. She will call with a PR in 2 weeks.

## 2013-07-16 NOTE — Patient Instructions (Signed)
Dexilant samples x 4 boxes. OV in 2 months.

## 2013-07-17 ENCOUNTER — Telehealth: Payer: Self-pay | Admitting: *Deleted

## 2013-07-17 LAB — VALPROIC ACID LEVEL: VALPROIC ACID LVL: 65 ug/mL (ref 50.0–100.0)

## 2013-07-17 NOTE — Telephone Encounter (Signed)
Received call from patient.   States that police report from theft of pain medications sent to office. MD confirmed receipt.   Patient reported that MD stated that she would write for pain medication until patient could get appointment with pain clinic.   MD made aware and states that no prescription will be given, patient needs to wait for pain clinic appointment.   Patient made aware and requested to speak with MD.   MD to be made aware.

## 2013-07-17 NOTE — Telephone Encounter (Signed)
Call returned to patient.   States that she is having difficulty getting in and out of shower and using the toilet. Requested orders for shower chair and elevated toilet seat.   Also states that she would like MD to recommend a dentist for her.   MD to be made aware.   States that Tonya Sullivan is working with her in trying to find placement, but they do not have placement at this time.

## 2013-07-17 NOTE — Telephone Encounter (Signed)
Message copied by Sheral Flow on Fri Jul 17, 2013 11:49 AM ------      Message from: Tonya Sullivan      Created: Fri Jul 17, 2013 10:22 AM      Regarding: questions      Contact: [phone]       Pt is wanting to ask dr Buelah Manis some questions she forgot to ask her the other day ------

## 2013-07-17 NOTE — Telephone Encounter (Signed)
Spoke with pt, I do not feel comfortable given her pain medications in an uncontrolled setting, see my previous note, her behavior is erratic she needs supervision which we are working on as well Pain clinic appts are pending for , she voiced understanding

## 2013-07-17 NOTE — Telephone Encounter (Signed)
Okay to write orders, I dont know any of the dentist in [place] so she will just have to call around to see if they will accept her insurance

## 2013-07-17 NOTE — Telephone Encounter (Signed)
Prescription faxed

## 2013-07-20 ENCOUNTER — Ambulatory Visit (INDEPENDENT_AMBULATORY_CARE_PROVIDER_SITE_OTHER): Payer: Medicare Other | Admitting: Psychiatry

## 2013-07-20 ENCOUNTER — Telehealth: Payer: Self-pay | Admitting: *Deleted

## 2013-07-20 DIAGNOSIS — F332 Major depressive disorder, recurrent severe without psychotic features: Secondary | ICD-10-CM

## 2013-07-20 NOTE — Progress Notes (Signed)
   THERAPIST PROGRESS NOTE  Session Time: Monday 07/20/2013 1:10 PM - 1:40 PM  Participation Level: Active  Behavioral Response: CasualAlertAnxious  Type of Therapy: Individual Therapy  Treatment Goals addressed: Improve ability to manage stress and anxiety using coping skills and relaxation techniques, Improve assertiveness   Interventions: Supportive  Summary: Tonya Sullivan is a 58 y.o. female who presents with a long-standing history of depression beginning in adolescence. Patient also has significant trauma history. Patient also experienced grief and loss issues due to to the death of her husband in 14-Sep-2010. She continues to experience stress related to ongoing health issues as well as family problems.She was hospitalized at the [facility] in May 2015 due to reaction to medication and depression.   Patient reports increased stress since last session. She says she was diagnosed with lung cancer last week. Patient says she suspected she had cancer. She and her family have a meeting with an oncologist on 07/22/2013 per patient's report. Patient also reports her PCP has ordered patient to receive care through assisted living facility. Patient expresses ambivalent feelings about this. She is thankful her son now contacts her more frequently. He currently is separated from girlfriend. Patient reports staying out of the middle of their arguments. She reports additional support from her mother and says her brother from [place] is coming to see her in August.  Suicidal/Homicidal: No  Therapist Response: Therapist works with patient to process feelings and identify ways to use support system.  Plan: Return again in 3 weeks.  Diagnosis: Axis I: MDD, Recurrent , Severe    Axis II: Deferred    Lean Fayson, LCSW 07/20/2013

## 2013-07-20 NOTE — Telephone Encounter (Signed)
She already has services, there is nothing more I can get set up for her at this time

## 2013-07-20 NOTE — Telephone Encounter (Signed)
She needs to go to the consult with CT to discuss the mass and perform the biopsy - please go to appointment they will handle eveyrthing She can not get around the clock nursing, I have already discussed these issues with her Friday evening THere is nothing more to discuss with her at this time.

## 2013-07-20 NOTE — Telephone Encounter (Signed)
Call placed to patient to make aware.   States that she would like to speak with MD about CAP program anyway.   MD to be made aware.

## 2013-07-20 NOTE — Telephone Encounter (Signed)
Received call from patient.   Reports that she has some concerns that she would like to discuss with MD.   States that she is concerned that her consult this week is only for a consult, not a biopsy. Reports that she thought it was supposed to be for surgical biopsy of site.   Also states that someone told her she could get round the clock nursing care if she has CAP program.   Was also concerned about how long it will take to get her into pain management clinic.   Advised that since she broke pain management contract with Dr. Merlene Laughter, and his not taking her back as a patient, it is taking a bit longer to find an acceptable solution. Referral nurse states that she is to call new clinic 07/20/2013 to find out where referral stands.

## 2013-07-20 NOTE — Telephone Encounter (Signed)
Received call from patient.   Reports that she is in tremendous pain (rates 10) and that she has not been out of bed all weekend.   States that she does not understand why MD will not prescribe pain medication.   Reiterated that MD does not feel comfortable in such an uncontrolled setting.   Referral nurse made aware and states that she is working on referral and is to call clinic today.

## 2013-07-20 NOTE — Patient Instructions (Signed)
Discussed orally 

## 2013-07-22 ENCOUNTER — Encounter: Payer: Self-pay | Admitting: Surgery

## 2013-07-22 ENCOUNTER — Institutional Professional Consult (permissible substitution) (INDEPENDENT_AMBULATORY_CARE_PROVIDER_SITE_OTHER): Payer: Medicare Other | Admitting: Surgery

## 2013-07-22 VITALS — BP 147/93 | HR 80 | Resp 16 | Ht 67.0 in | Wt 146.0 lb

## 2013-07-22 DIAGNOSIS — D381 Neoplasm of uncertain behavior of trachea, bronchus and lung: Secondary | ICD-10-CM

## 2013-07-22 DIAGNOSIS — R222 Localized swelling, mass and lump, trunk: Secondary | ICD-10-CM | POA: Diagnosis not present

## 2013-07-22 DIAGNOSIS — R918 Other nonspecific abnormal finding of lung field: Secondary | ICD-10-CM

## 2013-07-22 DIAGNOSIS — R599 Enlarged lymph nodes, unspecified: Secondary | ICD-10-CM

## 2013-07-22 DIAGNOSIS — R590 Localized enlarged lymph nodes: Secondary | ICD-10-CM

## 2013-07-23 ENCOUNTER — Other Ambulatory Visit: Payer: Self-pay | Admitting: *Deleted

## 2013-07-23 ENCOUNTER — Encounter (HOSPITAL_COMMUNITY): Payer: Self-pay | Admitting: *Deleted

## 2013-07-23 ENCOUNTER — Encounter (HOSPITAL_COMMUNITY): Payer: Self-pay

## 2013-07-23 DIAGNOSIS — J9859 Other diseases of mediastinum, not elsewhere classified: Secondary | ICD-10-CM

## 2013-07-23 DIAGNOSIS — R918 Other nonspecific abnormal finding of lung field: Secondary | ICD-10-CM

## 2013-07-24 ENCOUNTER — Encounter (HOSPITAL_COMMUNITY): Payer: Medicare Other | Admitting: Certified Registered Nurse Anesthetist

## 2013-07-24 ENCOUNTER — Encounter (HOSPITAL_COMMUNITY): Payer: Self-pay | Admitting: Certified Registered Nurse Anesthetist

## 2013-07-24 ENCOUNTER — Ambulatory Visit (HOSPITAL_COMMUNITY)
Admission: RE | Admit: 2013-07-24 | Discharge: 2013-07-24 | Disposition: A | Discharge status: Home / Self Care | Payer: Medicare Other | Source: Ambulatory Visit | Attending: Surgery | Admitting: Surgery

## 2013-07-24 ENCOUNTER — Ambulatory Visit (HOSPITAL_COMMUNITY): Payer: Medicare Other

## 2013-07-24 ENCOUNTER — Other Ambulatory Visit: Payer: Self-pay | Admitting: *Deleted

## 2013-07-24 ENCOUNTER — Ambulatory Visit (HOSPITAL_COMMUNITY): Payer: Medicare Other | Admitting: Certified Registered Nurse Anesthetist

## 2013-07-24 ENCOUNTER — Encounter (HOSPITAL_COMMUNITY)
Admission: RE | Disposition: A | Discharge status: Home / Self Care | Payer: Self-pay | Source: Ambulatory Visit | Attending: Surgery

## 2013-07-24 ENCOUNTER — Encounter: Payer: Self-pay | Admitting: Surgery

## 2013-07-24 DIAGNOSIS — J9859 Other diseases of mediastinum, not elsewhere classified: Secondary | ICD-10-CM

## 2013-07-24 DIAGNOSIS — F319 Bipolar disorder, unspecified: Secondary | ICD-10-CM | POA: Diagnosis not present

## 2013-07-24 DIAGNOSIS — R918 Other nonspecific abnormal finding of lung field: Secondary | ICD-10-CM

## 2013-07-24 DIAGNOSIS — J449 Chronic obstructive pulmonary disease, unspecified: Secondary | ICD-10-CM | POA: Diagnosis not present

## 2013-07-24 DIAGNOSIS — F17200 Nicotine dependence, unspecified, uncomplicated: Secondary | ICD-10-CM | POA: Diagnosis not present

## 2013-07-24 DIAGNOSIS — C3410 Malignant neoplasm of upper lobe, unspecified bronchus or lung: Secondary | ICD-10-CM | POA: Diagnosis not present

## 2013-07-24 DIAGNOSIS — Z79899 Other long term (current) drug therapy: Secondary | ICD-10-CM | POA: Insufficient documentation

## 2013-07-24 DIAGNOSIS — M5137 Other intervertebral disc degeneration, lumbosacral region: Secondary | ICD-10-CM | POA: Insufficient documentation

## 2013-07-24 DIAGNOSIS — E119 Type 2 diabetes mellitus without complications: Secondary | ICD-10-CM | POA: Diagnosis not present

## 2013-07-24 DIAGNOSIS — F411 Generalized anxiety disorder: Secondary | ICD-10-CM | POA: Insufficient documentation

## 2013-07-24 DIAGNOSIS — R222 Localized swelling, mass and lump, trunk: Secondary | ICD-10-CM

## 2013-07-24 DIAGNOSIS — E039 Hypothyroidism, unspecified: Secondary | ICD-10-CM | POA: Diagnosis not present

## 2013-07-24 DIAGNOSIS — Z8701 Personal history of pneumonia (recurrent): Secondary | ICD-10-CM | POA: Diagnosis not present

## 2013-07-24 DIAGNOSIS — Z8673 Personal history of transient ischemic attack (TIA), and cerebral infarction without residual deficits: Secondary | ICD-10-CM | POA: Diagnosis not present

## 2013-07-24 DIAGNOSIS — K219 Gastro-esophageal reflux disease without esophagitis: Secondary | ICD-10-CM | POA: Insufficient documentation

## 2013-07-24 DIAGNOSIS — Z85820 Personal history of malignant melanoma of skin: Secondary | ICD-10-CM | POA: Insufficient documentation

## 2013-07-24 DIAGNOSIS — I10 Essential (primary) hypertension: Secondary | ICD-10-CM | POA: Diagnosis not present

## 2013-07-24 DIAGNOSIS — IMO0002 Reserved for concepts with insufficient information to code with codable children: Secondary | ICD-10-CM | POA: Insufficient documentation

## 2013-07-24 DIAGNOSIS — E2749 Other adrenocortical insufficiency: Secondary | ICD-10-CM | POA: Diagnosis not present

## 2013-07-24 DIAGNOSIS — J4489 Other specified chronic obstructive pulmonary disease: Secondary | ICD-10-CM | POA: Insufficient documentation

## 2013-07-24 DIAGNOSIS — M51379 Other intervertebral disc degeneration, lumbosacral region without mention of lumbar back pain or lower extremity pain: Secondary | ICD-10-CM | POA: Insufficient documentation

## 2013-07-24 HISTORY — PX: VIDEO BRONCHOSCOPY WITH ENDOBRONCHIAL ULTRASOUND: SHX6177

## 2013-07-24 LAB — COMPREHENSIVE METABOLIC PANEL
ALK PHOS: 55 U/L (ref 39–117)
ALT: 10 U/L (ref 0–35)
AST: 14 U/L (ref 0–37)
Albumin: 3.1 g/dL — ABNORMAL LOW (ref 3.5–5.2)
Anion gap: 9 (ref 5–15)
BUN: 15 mg/dL (ref 6–23)
CO2: 32 mEq/L (ref 19–32)
Calcium: 9.1 mg/dL (ref 8.4–10.5)
Chloride: 101 mEq/L (ref 96–112)
Creatinine, Ser: 0.87 mg/dL (ref 0.50–1.10)
GFR calc Af Amer: 83 mL/min — ABNORMAL LOW (ref >90)
GFR calc non Af Amer: 72 mL/min — ABNORMAL LOW (ref >90)
GLUCOSE: 89 mg/dL (ref 70–99)
POTASSIUM: 4.6 meq/L (ref 3.7–5.3)
SODIUM: 142 meq/L (ref 137–147)
Total Bilirubin: <0.2 mg/dL — ABNORMAL LOW (ref 0.3–1.2)
Total Protein: 6.7 g/dL (ref 6.0–8.3)

## 2013-07-24 LAB — SURGICAL PCR SCREEN
MRSA, PCR: NEGATIVE
STAPHYLOCOCCUS AUREUS: NEGATIVE

## 2013-07-24 LAB — CBC
HCT: 42.5 % (ref 36.0–46.0)
HEMOGLOBIN: 13.4 g/dL (ref 12.0–15.0)
MCH: 29.5 pg (ref 26.0–34.0)
MCHC: 31.5 g/dL (ref 30.0–36.0)
MCV: 93.4 fL (ref 78.0–100.0)
Platelets: 266 10*3/uL (ref 150–400)
RBC: 4.55 MIL/uL (ref 3.87–5.11)
RDW: 14.7 % (ref 11.5–15.5)
WBC: 6.7 10*3/uL (ref 4.0–10.5)

## 2013-07-24 LAB — GLUCOSE, CAPILLARY
GLUCOSE-CAPILLARY: 94 mg/dL (ref 70–99)
Glucose-Capillary: 90 mg/dL (ref 70–99)

## 2013-07-24 LAB — APTT: APTT: 25 s (ref 24–37)

## 2013-07-24 LAB — PROTIME-INR
INR: 0.89 (ref 0.00–1.49)
Prothrombin Time: 12 seconds (ref 11.6–15.2)

## 2013-07-24 SURGERY — BRONCHOSCOPY, WITH EBUS
Anesthesia: General | Site: Chest

## 2013-07-24 MED ORDER — PROPOFOL 10 MG/ML IV BOLUS
INTRAVENOUS | Status: AC
  Filled 2013-07-24: qty 20

## 2013-07-24 MED ORDER — ARTIFICIAL TEARS OP OINT
TOPICAL_OINTMENT | OPHTHALMIC | Status: AC
  Filled 2013-07-24: qty 3.5

## 2013-07-24 MED ORDER — LACTATED RINGERS IV SOLN
INTRAVENOUS | Status: DC | PRN
  Administered 2013-07-24: 07:00:00 via INTRAVENOUS

## 2013-07-24 MED ORDER — OXYCODONE HCL 5 MG/5ML PO SOLN: 5.0000 mg | Freq: Once | ORAL | Status: DC | PRN

## 2013-07-24 MED ORDER — PROPOFOL 10 MG/ML IV BOLUS
INTRAVENOUS | Status: DC | PRN
  Administered 2013-07-24: 110 mg via INTRAVENOUS

## 2013-07-24 MED ORDER — MUPIROCIN 2 % EX OINT
1.0000 "application " | TOPICAL_OINTMENT | Freq: Once | CUTANEOUS | Status: AC
  Administered 2013-07-24: 1 via TOPICAL

## 2013-07-24 MED ORDER — FENTANYL CITRATE 0.05 MG/ML IJ SOLN: 25.0000 ug | INTRAMUSCULAR | Status: DC | PRN

## 2013-07-24 MED ORDER — MIDAZOLAM HCL 5 MG/5ML IJ SOLN
INTRAMUSCULAR | Status: DC | PRN
  Administered 2013-07-24: 1 mg via INTRAVENOUS

## 2013-07-24 MED ORDER — FENTANYL CITRATE 0.05 MG/ML IJ SOLN
INTRAMUSCULAR | Status: DC | PRN
  Administered 2013-07-24 (×2): 50 ug via INTRAVENOUS

## 2013-07-24 MED ORDER — MUPIROCIN 2 % EX OINT
TOPICAL_OINTMENT | CUTANEOUS | Status: AC
  Filled 2013-07-24: qty 22

## 2013-07-24 MED ORDER — METOPROLOL TARTRATE 25 MG PO TABS
25.0000 mg | ORAL_TABLET | Freq: Once | ORAL | Status: AC
  Administered 2013-07-24: 25 mg via ORAL

## 2013-07-24 MED ORDER — FENTANYL CITRATE 0.05 MG/ML IJ SOLN
INTRAMUSCULAR | Status: AC
  Filled 2013-07-24: qty 5

## 2013-07-24 MED ORDER — GLYCOPYRROLATE 0.2 MG/ML IJ SOLN
INTRAMUSCULAR | Status: DC | PRN
  Administered 2013-07-24: 0.4 mg via INTRAVENOUS
  Administered 2013-07-24: 0.2 mg via INTRAVENOUS

## 2013-07-24 MED ORDER — 0.9 % SODIUM CHLORIDE (POUR BTL) OPTIME
TOPICAL | Status: DC | PRN
  Administered 2013-07-24: 1000 mL

## 2013-07-24 MED ORDER — PHENYLEPHRINE HCL 10 MG/ML IJ SOLN
10.0000 mg | INTRAVENOUS | Status: DC | PRN
  Administered 2013-07-24: 10 ug/min via INTRAVENOUS

## 2013-07-24 MED ORDER — ROCURONIUM BROMIDE 50 MG/5ML IV SOLN
INTRAVENOUS | Status: AC
  Filled 2013-07-24: qty 1

## 2013-07-24 MED ORDER — NEOSTIGMINE METHYLSULFATE 10 MG/10ML IV SOLN
INTRAVENOUS | Status: DC | PRN
  Administered 2013-07-24: 2 mg via INTRAVENOUS
  Administered 2013-07-24: 3 mg via INTRAVENOUS

## 2013-07-24 MED ORDER — MIDAZOLAM HCL 2 MG/2ML IJ SOLN
INTRAMUSCULAR | Status: AC
  Filled 2013-07-24: qty 2

## 2013-07-24 MED ORDER — OXYCODONE HCL 5 MG PO TABS: 5.0000 mg | ORAL_TABLET | Freq: Once | ORAL | Status: DC | PRN

## 2013-07-24 MED ORDER — METHYLPREDNISOLONE SODIUM SUCC 125 MG IJ SOLR
INTRAMUSCULAR | Status: DC | PRN
  Administered 2013-07-24: 125 mg via INTRAVENOUS

## 2013-07-24 MED ORDER — LIDOCAINE HCL (CARDIAC) 20 MG/ML IV SOLN
INTRAVENOUS | Status: DC | PRN
  Administered 2013-07-24: 80 mg via INTRAVENOUS

## 2013-07-24 MED ORDER — METOPROLOL TARTRATE 12.5 MG HALF TABLET
ORAL_TABLET | ORAL | Status: AC
  Filled 2013-07-24: qty 2

## 2013-07-24 MED ORDER — ONDANSETRON HCL 4 MG/2ML IJ SOLN: 4.0000 mg | Freq: Four times a day (QID) | INTRAMUSCULAR | Status: DC | PRN

## 2013-07-24 MED ORDER — SODIUM CHLORIDE 0.9 % IJ SOLN
INTRAMUSCULAR | Status: AC
  Filled 2013-07-24: qty 10

## 2013-07-24 MED ORDER — ROCURONIUM BROMIDE 100 MG/10ML IV SOLN
INTRAVENOUS | Status: DC | PRN
  Administered 2013-07-24: 40 mg via INTRAVENOUS

## 2013-07-24 MED ORDER — EPHEDRINE SULFATE 50 MG/ML IJ SOLN
INTRAMUSCULAR | Status: AC
  Filled 2013-07-24: qty 1

## 2013-07-24 MED ORDER — LIDOCAINE HCL (CARDIAC) 20 MG/ML IV SOLN
INTRAVENOUS | Status: AC
  Filled 2013-07-24: qty 5

## 2013-07-24 MED ORDER — ONDANSETRON HCL 4 MG/2ML IJ SOLN
INTRAMUSCULAR | Status: AC
  Filled 2013-07-24: qty 2

## 2013-07-24 MED ORDER — PHENYLEPHRINE HCL 10 MG/ML IJ SOLN
INTRAMUSCULAR | Status: DC | PRN
  Administered 2013-07-24 (×4): 40 ug via INTRAVENOUS
  Administered 2013-07-24: 80 ug via INTRAVENOUS

## 2013-07-24 SURGICAL SUPPLY — 25 items
BRUSH CYTOL CELLEBRITY 1.5X140 (MISCELLANEOUS) ×2 IMPLANT
CANISTER SUCTION 2500CC (MISCELLANEOUS) ×3 IMPLANT
CONT SPEC 4OZ CLIKSEAL STRL BL (MISCELLANEOUS) ×3 IMPLANT
COVER SURGICAL LIGHT HANDLE (MISCELLANEOUS) ×3 IMPLANT
COVER TABLE BACK 60X90 (DRAPES) ×3 IMPLANT
FORCEPS BIOP RJ4 1.8 (CUTTING FORCEPS) ×2 IMPLANT
GLOVE BIO SURGEON STRL SZ 6.5 (GLOVE) ×2 IMPLANT
GLOVE BIO SURGEONS STRL SZ 6.5 (GLOVE) ×2
GLOVE EUDERMIC 7 POWDERFREE (GLOVE) ×3 IMPLANT
KIT ROOM TURNOVER OR (KITS) ×3 IMPLANT
MARKER SKIN DUAL TIP RULER LAB (MISCELLANEOUS) ×3 IMPLANT
NDL BIOPSY TRANSBRONCH 21G (NEEDLE) IMPLANT
NEEDLE BIOPSY TRANSBRONCH 21G (NEEDLE) IMPLANT
NEEDLE SYS SONOTIP II EBUSTBNA (NEEDLE) ×3 IMPLANT
NS IRRIG 1000ML POUR BTL (IV SOLUTION) ×3 IMPLANT
OIL SILICONE PENTAX (PARTS (SERVICE/REPAIRS)) ×3 IMPLANT
PAD ARMBOARD 7.5X6 YLW CONV (MISCELLANEOUS) ×6 IMPLANT
SPONGE GAUZE 4X4 12PLY (GAUZE/BANDAGES/DRESSINGS) ×3 IMPLANT
SYR 20CC LL (SYRINGE) ×3 IMPLANT
SYR 20ML ECCENTRIC (SYRINGE) ×6 IMPLANT
SYR 5ML LUER SLIP (SYRINGE) ×3 IMPLANT
TOWEL OR 17X24 6PK STRL BLUE (TOWEL DISPOSABLE) ×3 IMPLANT
TRAP SPECIMEN MUCOUS 40CC (MISCELLANEOUS) ×3 IMPLANT
TUBE CONNECTING 12'X1/4 (SUCTIONS) ×1
TUBE CONNECTING 12X1/4 (SUCTIONS) ×2 IMPLANT

## 2013-07-24 NOTE — Transfer of Care (Signed)
Immediate Anesthesia Transfer of Care Note  Patient: Tonya Sullivan  Procedure(s) Performed: Procedure(s): VIDEO BRONCHOSCOPY WITH ENDOBRONCHIAL ULTRASOUND (N/A)  Patient Location: PACU  Anesthesia Type:General  Level of Consciousness: awake, alert  and oriented  Airway & Oxygen Therapy: Patient Spontanous Breathing and Patient connected to nasal cannula oxygen  Post-op Assessment: Report given to PACU RN, Post -op Vital signs reviewed and stable and Patient moving all extremities X 4  Post vital signs: Reviewed and stable  Complications: No apparent anesthesia complications

## 2013-07-24 NOTE — Discharge Instructions (Addendum)
Resume normal activity.  May cough up some blood tinged secretions for a few days.  What to eat:  For your first meals, you should eat lightly; only small meals initially.  If you do not have nausea, you may eat larger meals.  Avoid spicy, greasy and heavy food.    General Anesthesia, Adult, Care After  Refer to this sheet in the next few weeks. These instructions provide you with information on caring for yourself after your procedure. Your health care provider may also give you more specific instructions. Your treatment has been planned according to current medical practices, but problems sometimes occur. Call your health care provider if you have any problems or questions after your procedure.  WHAT TO EXPECT AFTER THE PROCEDURE  After the procedure, it is typical to experience:  Sleepiness.  Nausea and vomiting. HOME CARE INSTRUCTIONS  For the first 24 hours after general anesthesia:  Have a responsible person with you.  Do not drive a car. If you are alone, do not take public transportation.  Do not drink alcohol.  Do not take medicine that has not been prescribed by your health care provider.  Do not sign important papers or make important decisions.  You may resume a normal diet and activities as directed by your health care provider.  Change bandages (dressings) as directed.  If you have questions or problems that seem related to general anesthesia, call the hospital and ask for the anesthetist or anesthesiologist on call. SEEK MEDICAL CARE IF:  You have nausea and vomiting that continue the day after anesthesia.  You develop a rash. SEEK IMMEDIATE MEDICAL CARE IF:  You have difficulty breathing.  You have chest pain.  You have any allergic problems. Document Released: 03/26/2000 Document Revised: 08/20/2012 Document Reviewed: 07/03/2012  [facility] Patient Information 2014 [place], [place].

## 2013-07-24 NOTE — Anesthesia Procedure Notes (Signed)
Procedure Name: Intubation Date/Time: 07/24/2013 7:57 AM Performed by: Trixie Deis A Pre-anesthesia Checklist: Patient identified, Timeout performed, Emergency Drugs available, Suction available and Patient being monitored Patient Re-evaluated:Patient Re-evaluated prior to inductionOxygen Delivery Method: Circle system utilized Preoxygenation: Pre-oxygenation with 100% oxygen Intubation Type: IV induction Ventilation: Mask ventilation without difficulty Laryngoscope Size: Mac and 3 Grade View: Grade I Tube type: Oral Tube size: 8.5 mm Number of attempts: 1 Airway Equipment and Method: Stylet Placement Confirmation: ETT inserted through vocal cords under direct vision,  breath sounds checked- equal and bilateral and positive ETCO2 Secured at: 23 cm Tube secured with: Tape Dental Injury: Teeth and Oropharynx as per pre-operative assessment

## 2013-07-24 NOTE — Brief Op Note (Signed)
07/24/2013  9:10 AM  PATIENT:  Tonya Sullivan  58 y.o. female  PRE-OPERATIVE DIAGNOSIS:  RIGHT HILAR MASS MEDIASTINAL MASS  POST-OPERATIVE DIAGNOSIS:  small cell carcinoma  PROCEDURE:  Procedure(s): VIDEO BRONCHOSCOPY WITH ENDOBRONCHIAL ULTRASOUND (N/A)  SURGEON:  Surgeon(s) and Role:    * [name], MD - Primary  PHYSICIAN ASSISTANT: none   ANESTHESIA:   general  EBL:  Total I/O In: 800 [I.V.:800] Out: -   BLOOD ADMINISTERED:none  DRAINS: none   LOCAL MEDICATIONS USED:  NONE  SPECIMEN:  Source of Specimen:  Precarinal lymph node mass, right upper lobe biopsies, brushings, and washings.  DISPOSITION OF SPECIMEN:  PATHOLOGY  COUNTS:  YES  TOURNIQUET:  * No tourniquets in log *  DICTATION: .Note written in [place]: Discharge to home after PACU  PATIENT DISPOSITION:  PACU - hemodynamically stable.   Delay start of Pharmacological VTE agent (>24hrs) due to surgical blood loss or risk of bleeding: not applicable

## 2013-07-24 NOTE — H&P (Signed)
[address]       [place] [address]             [phone]      Cardiothoracic Surgery History and Physical   PCP is Vic Blackbird, MD Referring Provider is [facility], Modena Nunnery, MD    Chief Complaint   Patient presents with   .  Lung Mass       eval and treat...referral from Dr. Ofilia Neas CHEST   .  Adenopathy   .  Lung Lesion        HPI:   58 year old woman with a history of smoking and COPD, bipolar disorder, who presented with cough, shortness of breath and fatigue and had a CXR and CT scan of the chest showing a right upper lobe lung mass and a large right hilar mass extending to the mediastinum anterior to the carina.    Past Medical History   Diagnosis  Date   .  COPD (chronic obstructive pulmonary disease)     .  Depression     .  Chronic back pain         DDD, disc bulge, radiculopathy, spinal stenosis   .  Hyperlipidemia     .  Anxiety     .  Bipolar disorder, unspecified     .  Carotid artery calcification     .  DDD (degenerative disc disease), lumbosacral     .  Spinal stenosis, lumbar     .  Thyroid disease     .  Autoimmune thyroiditis     .  Hypertension     .  TIA (transient ischemic attack)         august 2014   .  GERD (gastroesophageal reflux disease)     .  H/O dizziness     .  Neuropathic pain     .  Adrenal gland dysfunction         Addison's disease ON DAILY PREDNISONE   .  Addison's disease     .  Stroke         HX OF MINI STROKE AUG 2014- DROPPIN EVERYTHING, SPEECH SLURRED, MEMORY AFFECTED --ALL RESOLVED - NO DEFICITS NOW   .  Pneumonia         LAST PNEUMONIA WAS AUG 2014   .  Achalasia     .  Mouth ulcers     .  Dysrhythmia         palpitations   .  Melanoma         removed 1 week ago at Dr Teryl Lucy office   .  Addison's disease     .  Frequent falls     .  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 Not taking meds at this time         Past Surgical History     Procedure  Laterality  Date   .  Appendectomy       .  Cholecystectomy       .  Lump left breast           removed- benign   .  Abdominal hysterectomy           tubal pregnancy   .  Inguinal hernia repair       .  Stomach surgery           ?  holes in esophgous   .  Ectopic pregnancy surgery       .  Back surgery           x 5;1984;1989;;1999;2000;2010   .  Colonoscopy    01/25/2012       Procedure: COLONOSCOPY;  Surgeon: Rogene Houston, MD;  Location: [facility];  Service: Endoscopy;  Laterality: N/A;  130   .  Esophagogastroduodenoscopy (egd) with esophageal dilation  N/A  08/13/2012       Procedure: ESOPHAGOGASTRODUODENOSCOPY (EGD) WITH ESOPHAGEAL DILATION;  Surgeon: Rogene Houston, MD;  Location: [facility];  Service: Endoscopy;  Laterality: N/A;  315   .  Anterior cervical decomp/discectomy fusion  N/A  10/21/2012       Procedure: Cervical Six-Seven Anterior cervical decompression/diskectomy/fusion;  Surgeon: Kristeen Miss, MD;  Location: [facility];  Service: Neurosurgery;  Laterality: N/A;  Cervical Six-Seven Anterior cervical decompression/diskectomy/fusion   .  Cataract extraction w/phaco  Right  11/25/2012       Procedure: CATARACT EXTRACTION PHACO AND INTRAOCULAR LENS PLACEMENT (IOC);  Surgeon: Elta Guadeloupe T. Gershon Crane, MD;  Location: [facility];  Service: Ophthalmology;  Laterality: Right;  CDE:10.06   .  Cataract extraction w/phaco  Left  12/09/2012       Procedure: CATARACT EXTRACTION PHACO AND INTRAOCULAR LENS PLACEMENT (IOC);  Surgeon: Elta Guadeloupe T. Gershon Crane, MD;  Location: [facility];  Service: Ophthalmology;  Laterality: Left;  CDE:5.06   .  Tonsillectomy       .  Hiatal hernia repair       .  Heller myotomy  N/A  03/17/2013       Procedure: DIAGNOSTIC LAPAROSCOPY, LAPAROSCOPIC HELLER MYOTOMY, ENDOSCOPY, DOR FUNDOPLICATION;  Surgeon: Ralene Ok, MD;  Location: [facility];  Service: General;  Laterality: N/A;   .  Breast surgery           left breast   .  Eye surgery              Family History   Problem  Relation  Age of Onset   .  Hypertension  Mother     .  Heart disease  Mother     .  Heart attack  Mother     .  Anxiety disorder  Mother     .  Cancer  Father         lung cancer   .  Heart attack  Father     .  Alcohol abuse  Father     .  Hypertension  Brother     .  Alcohol abuse  Brother     .  Bipolar disorder  Brother     .  ADD / ADHD  Brother     .  Drug abuse  Brother     .  OCD  Brother     .  Alcohol abuse  Sister     .  Bipolar disorder  Sister     .  Anxiety disorder  Sister     .  ADD / ADHD  Sister     .  Drug abuse  Sister     .  Physical abuse  Sister     .  Sexual abuse  Sister     .  Alcohol abuse  Brother     .  Bipolar disorder  Brother     .  ADD / ADHD  Brother     .  Drug abuse  Brother     .  Alcohol abuse  Sister     .  Bipolar disorder  Sister     .  Anxiety disorder  Sister     .  ADD / ADHD  Sister     .  Physical abuse  Sister     .  Sexual abuse  Sister     .  Alcohol abuse  Brother     .  Bipolar disorder  Brother     .  ADD / ADHD  Brother     .  Bipolar disorder  Brother     .  ADD / ADHD  Brother     .  Alcohol abuse  Brother     .  Paranoid behavior  Brother     .  Physical abuse  Brother     .  Sexual abuse  Brother     .  Bipolar disorder  Maternal Aunt     .  Bipolar disorder  Paternal Aunt     .  Bipolar disorder  Maternal Uncle     .  Bipolar disorder  Paternal Uncle     .  Bipolar disorder  Maternal Grandfather     .  Alcohol abuse  Maternal Grandfather     .  Bipolar disorder  Maternal Grandmother     .  Alcohol abuse  Maternal Grandmother     .  Anxiety disorder  Maternal Grandmother     .  Dementia  Maternal Grandmother     .  Bipolar disorder  Paternal Grandfather     .  Alcohol abuse  Paternal Grandfather     .  Bipolar disorder  Paternal Grandmother     .  Alcohol abuse  Paternal Grandmother     .  Drug abuse  Paternal Grandmother     .  Bipolar disorder  Maternal Uncle     .   Schizophrenia  Neg Hx     .  Seizures  Neg Hx          Social History History   Substance Use Topics   .  Smoking status:  Current Some Day Smoker -- 0.25 packs/day for 40 years       Types:  Cigarettes, E-cigarettes       Last Attempt to Quit:  03/05/2013   .  Smokeless tobacco:  Never Used         Comment: 5 cigs day 03/12/13   .  Alcohol Use:  No         No current facility-administered medications for this visit.       Current Outpatient Prescriptions   Medication  Sig  Dispense  Refill   .  [DISCONTINUED] Gabapentin, PHN, (GRALISE STARTER) 300 & 600 MG MISC  Take 300-1,800 mg by mouth as directed. 15 DAY Starter pack doses     DAY 1- 300 mg     DAY 2- 600 mg     DAY 3 to 6 - 900 mg    DAY 7 to 10 - 1200 mg      DAY 11 to 14 - 1500 mg      DAY 15 - 1800 mg              Facility-Administered Medications Ordered in Other Visits   Medication  Dose  Route  Frequency  Provider  Last Rate  Last Dose   .  metoprolol tartrate (LOPRESSOR) 12.5 mg tablet                  .  mupirocin ointment (BACTROBAN) 2 %                        Allergies   Allergen  Reactions   .  Codeine  Itching   .  Cyclobenzaprine  Hives   .  Darvocet [Propoxyphene N-Acetaminophen]  Rash   .  Abilify [Aripiprazole]  Other (See Comments)       tremors   .  Ace Inhibitors  Other (See Comments)       unknown   .  Adhesive [Tape]  Hives   .  Gabapentin  Swelling   .  Iodine  Hives   .  Levofloxacin  Hives and Hypertension   .  Prednisone  Hives, Itching and Hypertension   .  Sulfa Antibiotics  Itching   .  Robaxin [Methocarbamol]  Rash        Review of Systems  Constitutional: Positive for appetite change, fatigue and unexpected weight change. Negative for fever.  HENT: Negative.   Eyes: Negative.   Respiratory: Positive for cough and shortness of breath.   Cardiovascular: Negative for chest pain and leg swelling.  Gastrointestinal: Negative.   Endocrine: Negative.   Genitourinary:  Negative.   Musculoskeletal: Negative.   Skin: Negative.   Allergic/Immunologic: Negative.   Neurological: Negative.   Hematological: Negative.   Psychiatric/Behavioral:        Bipolar disorder      BP 147/93  Pulse 80  Resp 16  Ht 5\' 7"  (1.702 m)  Wt 146 lb (66.225 kg)  BMI 22.86 kg/m2  SpO2 97% Physical Exam  Constitutional: She is oriented to person, place, and time. She appears well-developed and well-nourished. No distress.  HENT:   Mouth/Throat: Oropharynx is clear and moist.  Eyes: EOM are normal. Pupils are equal, round, and reactive to light.  Neck: Normal range of motion. Neck supple. No tracheal deviation present. No thyromegaly present.  Cardiovascular: Normal rate, regular rhythm, normal heart sounds and intact distal pulses.    No murmur heard. Pulmonary/Chest: Breath sounds normal. No respiratory distress. She has no wheezes. She has no rales. She exhibits no tenderness.  Abdominal: Soft. Bowel sounds are normal. She exhibits no distension and no mass. There is no tenderness.  Musculoskeletal: She exhibits no edema.  Lymphadenopathy:    She has no cervical adenopathy.  Neurological: She is alert and oriented to person, place, and time. She has normal strength. No cranial nerve deficit or sensory deficit.  Skin: Skin is warm and dry.  Psychiatric: She has a normal mood and affect.        Diagnostic Tests:   CLINICAL DATA: Cough, possible right hilar mass   EXAM:   CT CHEST WITH CONTRAST   TECHNIQUE:   Multidetector CT imaging of the chest was performed during   intravenous contrast administration.   CONTRAST: 120mL OMNIPAQUE IOHEXOL 300 MG/ML SOLN   COMPARISON: 07/09/2013   FINDINGS:   The lungs are well aerated bilaterally. The left lung shows no focal   infiltrate or sizable parenchymal nodule. The right lung shows   evidence of a large right hilar mass which measures 3.3 x 3.1 cm in   dimension. Additionally there is a smaller parenchymal  lesion in the   right upper lobe measuring 12 mm in greatest dimension the hilar   component extends into the mediastinum anterior to the carina and   there is a focal area of extension or lymphadenopathy which measures   2.6  x 1.7 cm in greatest dimension. No other significant hilar or   mediastinal adenopathy is seen. No vascular ingrowth is noted.   The bony structures show no definitive metastatic foci.   The upper abdomen is within normal limits.   IMPRESSION:   Large right hilar mass with evidence of local extension/ adenopathy   the anterior to the carina. Additionally a peripheral 12 mm lesion   is noted within the right upper lobe. These changes are consistent   with primary pulmonary neoplasm. Tissue sampling is recommended.   Electronically Signed   By: Inez Catalina M.D.   On: 07/14/2013 15:37      Impression:   12 mm RUL lung mass and large right hilar mass that may be metastatic lymphadenopathy. I have recommended biopsy of the mediastinal and hilar mass by EBUS. I discussed the procedure of bronchoscopy and EBUS with the patient and her mother including alternatives, benefits and risks and they agree to proceed.   Plan:   Bronchoscopy and EBUS

## 2013-07-24 NOTE — Anesthesia Preprocedure Evaluation (Addendum)
Anesthesia Evaluation  Patient identified by MRN, date of birth, ID band Patient awake    Reviewed: Allergy & Precautions, H&P   History of Anesthesia Complications (+) history of anesthetic complications (feels "different" after surgeries for several days postop. )  Airway Mallampati: II TM Distance: >3 FB Neck ROM: Limited    Dental  (+) Upper Dentures, Lower Dentures, Dental Advisory Given   Pulmonary pneumonia -, resolved, COPD COPD inhaler, Current Smoker,          Cardiovascular hypertension, Pt. on medications + Peripheral Vascular Disease + dysrhythmias  04/2013 echo EF 60-65%   Neuro/Psych PSYCHIATRIC DISORDERS Anxiety Depression Bipolar Disorder TIA Neuromuscular disease (tingling and numbess, pain in bilateral arms. )    GI/Hepatic GERD-  Medicated and Controlled,Hx esophageal surgery for stricture   Endo/Other  diabetes (diet managed), Type 2Hypothyroidism Addison's disease- last prednisone dose 7/23 AM  Renal/GU      Musculoskeletal   Abdominal   Peds  Hematology   Anesthesia Other Findings   Reproductive/Obstetrics                       Anesthesia Physical Anesthesia Plan  ASA: III  Anesthesia Plan: General   Post-op Pain Management:    Induction: Intravenous  Airway Management Planned: Oral ETT  Additional Equipment:   Intra-op Plan:   Post-operative Plan: Extubation in OR  Informed Consent: I have reviewed the patients History and Physical, chart, labs and discussed the procedure including the risks, benefits and alternatives for the proposed anesthesia with the patient or authorized representative who has indicated his/her understanding and acceptance.     Plan Discussed with: CRNA, Anesthesiologist and Surgeon  Anesthesia Plan Comments:         Anesthesia Quick Evaluation

## 2013-07-24 NOTE — Anesthesia Postprocedure Evaluation (Signed)
Anesthesia Post Note  Patient: Tonya Sullivan  Procedure(s) Performed: Procedure(s) (LRB): VIDEO BRONCHOSCOPY WITH ENDOBRONCHIAL ULTRASOUND (N/A)  Anesthesia type: General  Patient location: PACU  Post pain: Pain level controlled and Adequate analgesia  Post assessment: Post-op Vital signs reviewed, Patient's Cardiovascular Status Stable, Respiratory Function Stable, Patent Airway and Pain level controlled  Last Vitals:  Filed Vitals:   07/24/13 1044  BP: 109/73  Pulse: 80  Temp: 36.3 C  Resp: 15    Post vital signs: Reviewed and stable  Level of consciousness: awake, alert  and oriented  Complications: No apparent anesthesia complications

## 2013-07-24 NOTE — Interval H&P Note (Signed)
History and Physical Interval Note:  07/24/2013 7:38 AM  Tonya Sullivan  has presented today for surgery, with the diagnosis of RIGHT HILAR MASS MEDIASTINAL MASS  The various methods of treatment have been discussed with the patient and family. After consideration of risks, benefits and other options for treatment, the patient has consented to  Procedure(s): [place] (N/A) as a surgical intervention .  The patient's history has been reviewed, patient examined, no change in status, stable for surgery.  I have reviewed the patient's chart and labs.  Questions were answered to the patient's satisfaction.     [name]

## 2013-07-24 NOTE — Op Note (Signed)
  CARDIOTHORACIC SURGERY OPERATIVE NOTE:  Tonya Sullivan [id number] 07/24/2013   Preoperative Dx:  Right upper lobe lung mass and right hilar lung mass  Postoperative Dx: Small cell lung cancer   Procedure:  Video bronchoscopy with biopsy, brushings, and washings of RUL, Endobronchial ultrasound with biopsy of pre-carinal lymph node mass.  Surgeon: Dr. [name]   Assistant: none  Anesthesia: GET   Clinical History:   58 year old woman with a history of smoking and COPD, bipolar disorder, who presented with cough, shortness of breath and fatigue and had a CXR and CT scan of the chest showing a right upper lobe lung mass and a large right hilar mass extending to the mediastinum anterior to the carina.  She has a 12 mm RUL lung mass and large right hilar mass that may be metastatic lymphadenopathy. I have recommended biopsy of the mediastinal and hilar mass by EBUS. I discussed the procedure of bronchoscopy and EBUS with the patient and her mother including alternatives, benefits and risks and they agree to proceed.    Video Bronchoscopy:  The patient was placed on the OR table in supine position. After general endotracheal anesthesia a time out was taken and the correct patient and procedure confirmed. The bronchoscope was advanced down the ETT. The distal trachea and carina were normal. The left bronchial tree had normal segmental anatomy with no endobronchial lesions or extrinsic compression. The right mainstem bronchus appeared normal. There was some edema and narrowing of the RUL bronchus as well as the segmental bronchi in the RUL. Biopsies, brushings, and washings of the segmental bronchi were performed. Hemostasis was complete and the bronchoscope removed.   Endobronchial Ultrasound:  The EBUS scope was positioned to visualize the anterior precarinal space. A large lymph node mass was seen. Multiple biopsies were taken and prepared in the OR by the pathology tech. These were  taken to pathology and examined by Dr. Mali Rund who called the OR and said that this showed carcinoma that appeared to be small cell carcinoma. There was complete hemostasis and the scope was removed.   The patient was then awakened, extubated and transferred to the PACU in stable condition.

## 2013-07-24 NOTE — Progress Notes (Signed)
PCP is Vic Blackbird, MD Referring Provider is [facility], Modena Nunnery, MD  Chief Complaint  Patient presents with  . Lung Mass    eval and treat...referral from Dr. Ofilia Neas CHEST  . Adenopathy  . Lung Lesion    HPI:  58 year old woman with a history of smoking and COPD, bipolar disorder, who presented with cough, shortness of breath and fatigue and had a CXR and CT scan of the chest showing a right upper lobe lung mass and a large right hilar mass extending to the mediastinum anterior to the carina.  Past Medical History  Diagnosis Date  . COPD (chronic obstructive pulmonary disease)   . Depression   . Chronic back pain     DDD, disc bulge, radiculopathy, spinal stenosis  . Hyperlipidemia   . Anxiety   . Bipolar disorder, unspecified   . Carotid artery calcification   . DDD (degenerative disc disease), lumbosacral   . Spinal stenosis, lumbar   . Thyroid disease   . Autoimmune thyroiditis   . Hypertension   . TIA (transient ischemic attack)     august 2014  . GERD (gastroesophageal reflux disease)   . H/O dizziness   . Neuropathic pain   . Adrenal gland dysfunction     Addison's disease ON DAILY PREDNISONE  . Addison's disease   . Stroke     HX OF MINI STROKE AUG 2014- DROPPIN EVERYTHING, SPEECH SLURRED, MEMORY AFFECTED --ALL RESOLVED - NO DEFICITS NOW  . Pneumonia     LAST PNEUMONIA WAS AUG 2014  . Achalasia   . Mouth ulcers   . Dysrhythmia     palpitations  . Melanoma     removed 1 week ago at Dr Teryl Lucy office  . Addison's disease   . Frequent falls   . 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 Not taking meds at this time    Past Surgical History  Procedure Laterality Date  . Appendectomy    . Cholecystectomy    . Lump left breast      removed- benign  . Abdominal hysterectomy      tubal pregnancy  . Inguinal hernia repair    . Stomach surgery      ?holes in esophgous  . Ectopic pregnancy surgery    . Back surgery        x 5;1984;1989;;1999;2000;2010  . Colonoscopy  01/25/2012    Procedure: COLONOSCOPY;  Surgeon: Rogene Houston, MD;  Location: [facility];  Service: Endoscopy;  Laterality: N/A;  130  . Esophagogastroduodenoscopy (egd) with esophageal dilation N/A 08/13/2012    Procedure: ESOPHAGOGASTRODUODENOSCOPY (EGD) WITH ESOPHAGEAL DILATION;  Surgeon: Rogene Houston, MD;  Location: [facility];  Service: Endoscopy;  Laterality: N/A;  315  . Anterior cervical decomp/discectomy fusion N/A 10/21/2012    Procedure: Cervical Six-Seven Anterior cervical decompression/diskectomy/fusion;  Surgeon: Kristeen Miss, MD;  Location: [facility];  Service: Neurosurgery;  Laterality: N/A;  Cervical Six-Seven Anterior cervical decompression/diskectomy/fusion  . Cataract extraction w/phaco Right 11/25/2012    Procedure: CATARACT EXTRACTION PHACO AND INTRAOCULAR LENS PLACEMENT (IOC);  Surgeon: Elta Guadeloupe T. Gershon Crane, MD;  Location: [facility];  Service: Ophthalmology;  Laterality: Right;  CDE:10.06  . Cataract extraction w/phaco Left 12/09/2012    Procedure: CATARACT EXTRACTION PHACO AND INTRAOCULAR LENS PLACEMENT (IOC);  Surgeon: Elta Guadeloupe T. Gershon Crane, MD;  Location: [facility];  Service: Ophthalmology;  Laterality: Left;  CDE:5.06  . Tonsillectomy    .  Hiatal hernia repair    . Heller myotomy N/A 03/17/2013    Procedure: DIAGNOSTIC LAPAROSCOPY, LAPAROSCOPIC HELLER MYOTOMY, ENDOSCOPY, DOR FUNDOPLICATION;  Surgeon: Ralene Ok, MD;  Location: [facility];  Service: General;  Laterality: N/A;  . Breast surgery      left breast  . Eye surgery      Family History  Problem Relation Age of Onset  . Hypertension Mother   . Heart disease Mother   . Heart attack Mother   . Anxiety disorder Mother   . Cancer Father     lung cancer  . Heart attack Father   . Alcohol abuse Father   . Hypertension Brother   . Alcohol abuse Brother   . Bipolar disorder Brother   . ADD / ADHD Brother   . Drug abuse Brother   . OCD Brother   . Alcohol abuse  Sister   . Bipolar disorder Sister   . Anxiety disorder Sister   . ADD / ADHD Sister   . Drug abuse Sister   . Physical abuse Sister   . Sexual abuse Sister   . Alcohol abuse Brother   . Bipolar disorder Brother   . ADD / ADHD Brother   . Drug abuse Brother   . Alcohol abuse Sister   . Bipolar disorder Sister   . Anxiety disorder Sister   . ADD / ADHD Sister   . Physical abuse Sister   . Sexual abuse Sister   . Alcohol abuse Brother   . Bipolar disorder Brother   . ADD / ADHD Brother   . Bipolar disorder Brother   . ADD / ADHD Brother   . Alcohol abuse Brother   . Paranoid behavior Brother   . Physical abuse Brother   . Sexual abuse Brother   . Bipolar disorder Maternal Aunt   . Bipolar disorder Paternal Aunt   . Bipolar disorder Maternal Uncle   . Bipolar disorder Paternal Uncle   . Bipolar disorder Maternal Grandfather   . Alcohol abuse Maternal Grandfather   . Bipolar disorder Maternal Grandmother   . Alcohol abuse Maternal Grandmother   . Anxiety disorder Maternal Grandmother   . Dementia Maternal Grandmother   . Bipolar disorder Paternal Grandfather   . Alcohol abuse Paternal Grandfather   . Bipolar disorder Paternal Grandmother   . Alcohol abuse Paternal Grandmother   . Drug abuse Paternal Grandmother   . Bipolar disorder Maternal Uncle   . Schizophrenia Neg Hx   . Seizures Neg Hx     Social History History  Substance Use Topics  . Smoking status: Current Some Day Smoker -- 0.25 packs/day for 40 years    Types: Cigarettes, E-cigarettes    Last Attempt to Quit: 03/05/2013  . Smokeless tobacco: Never Used     Comment: 5 cigs day 03/12/13  . Alcohol Use: No    No current facility-administered medications for this visit.   Current Outpatient Prescriptions  Medication Sig Dispense Refill  . [DISCONTINUED] Gabapentin, PHN, (GRALISE STARTER) 300 & 600 MG MISC Take 300-1,800 mg by mouth as directed. 15 DAY Starter pack doses     DAY 1- 300 mg     DAY 2- 600  mg     DAY 3 to 6 - 900 mg    DAY 7 to 10 - 1200 mg     DAY 11 to 14 - 1500 mg      DAY 15 - 1800 mg        Facility-Administered  Medications Ordered in Other Visits  Medication Dose Route Frequency Provider Last Rate Last Dose  . metoprolol tartrate (LOPRESSOR) 12.5 mg tablet           . mupirocin ointment (BACTROBAN) 2 %             Allergies  Allergen Reactions  . Codeine Itching  . Cyclobenzaprine Hives  . Darvocet [Propoxyphene N-Acetaminophen] Rash  . Abilify [Aripiprazole] Other (See Comments)    tremors  . Ace Inhibitors Other (See Comments)    unknown  . Adhesive [Tape] Hives  . Gabapentin Swelling  . Iodine Hives  . Levofloxacin Hives and Hypertension  . Prednisone Hives, Itching and Hypertension  . Sulfa Antibiotics Itching  . Robaxin [Methocarbamol] Rash    Review of Systems  Constitutional: Positive for appetite change, fatigue and unexpected weight change. Negative for fever.  HENT: Negative.   Eyes: Negative.   Respiratory: Positive for cough and shortness of breath.   Cardiovascular: Negative for chest pain and leg swelling.  Gastrointestinal: Negative.   Endocrine: Negative.   Genitourinary: Negative.   Musculoskeletal: Negative.   Skin: Negative.   Allergic/Immunologic: Negative.   Neurological: Negative.   Hematological: Negative.   Psychiatric/Behavioral:       Bipolar disorder    BP 147/93  Pulse 80  Resp 16  Ht 5\' 7"  (1.702 m)  Wt 146 lb (66.225 kg)  BMI 22.86 kg/m2  SpO2 97% Physical Exam  Constitutional: She is oriented to person, place, and time. She appears well-developed and well-nourished. No distress.  HENT:  Mouth/Throat: Oropharynx is clear and moist.  Eyes: EOM are normal. Pupils are equal, round, and reactive to light.  Neck: Normal range of motion. Neck supple. No tracheal deviation present. No thyromegaly present.  Cardiovascular: Normal rate, regular rhythm, normal heart sounds and intact distal pulses.   No murmur  heard. Pulmonary/Chest: Breath sounds normal. No respiratory distress. She has no wheezes. She has no rales. She exhibits no tenderness.  Abdominal: Soft. Bowel sounds are normal. She exhibits no distension and no mass. There is no tenderness.  Musculoskeletal: She exhibits no edema.  Lymphadenopathy:    She has no cervical adenopathy.  Neurological: She is alert and oriented to person, place, and time. She has normal strength. No cranial nerve deficit or sensory deficit.  Skin: Skin is warm and dry.  Psychiatric: She has a normal mood and affect.     Diagnostic Tests:  CLINICAL DATA: Cough, possible right hilar mass  EXAM:  CT CHEST WITH CONTRAST  TECHNIQUE:  Multidetector CT imaging of the chest was performed during  intravenous contrast administration.  CONTRAST: 192mL OMNIPAQUE IOHEXOL 300 MG/ML SOLN  COMPARISON: 07/09/2013  FINDINGS:  The lungs are well aerated bilaterally. The left lung shows no focal  infiltrate or sizable parenchymal nodule. The right lung shows  evidence of a large right hilar mass which measures 3.3 x 3.1 cm in  dimension. Additionally there is a smaller parenchymal lesion in the  right upper lobe measuring 12 mm in greatest dimension the hilar  component extends into the mediastinum anterior to the carina and  there is a focal area of extension or lymphadenopathy which measures  2.6 x 1.7 cm in greatest dimension. No other significant hilar or  mediastinal adenopathy is seen. No vascular ingrowth is noted.  The bony structures show no definitive metastatic foci.  The upper abdomen is within normal limits.  IMPRESSION:  Large right hilar mass with evidence of local extension/ adenopathy  the anterior to the carina. Additionally a peripheral 12 mm lesion  is noted within the right upper lobe. 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 Tissue sampling is recommended.  Electronically Signed  By: Inez Catalina M.D.  On: 07/14/2013  15:37    Impression:  12 mm RUL lung mass and large right hilar mass that may be metastatic lymphadenopathy. I have recommended biopsy of the mediastinal and hilar mass by EBUS. I discussed the procedure of bronchoscopy and EBUS with the patient and her mother including alternatives, benefits and risks and they agree to proceed.  Plan:  Bronchoscopy and EBUS

## 2013-07-28 ENCOUNTER — Telehealth: Payer: Self-pay | Admitting: *Deleted

## 2013-07-28 ENCOUNTER — Encounter (HOSPITAL_COMMUNITY): Payer: Self-pay | Admitting: Surgery

## 2013-07-28 NOTE — Telephone Encounter (Signed)
Await pain clinic I have discussed with pt already, also we have no staging yet on her cancer

## 2013-07-28 NOTE — Telephone Encounter (Signed)
Received call from patient.   Reports that she has new dx of cancer and is in great amounts of pain. Reports that she has been told that she does not have long to live and she does not feel like she should have to wait for pain relief.   Advised that referral has been placed to pain management and Dr. Isabelle Course office is supposed to contact her this week (per referral nurse).  Gave patient contact info for Dr. Isabelle Course office.

## 2013-07-28 NOTE — Telephone Encounter (Signed)
Received another call from patient.   Reports that she spoke with nurse at Dr. Isabelle Course office and was advised that they have not received records from Dr. Merlene Laughter and [facility].   Referral nurse made aware and states that all records [facility] has have been faxed to Dr. Isabelle Course office.

## 2013-07-29 ENCOUNTER — Telehealth: Payer: Self-pay | Admitting: *Deleted

## 2013-07-29 NOTE — Telephone Encounter (Signed)
Called pt left vm message with appt for [facility] 08/06/13.

## 2013-07-30 ENCOUNTER — Telehealth: Payer: Self-pay | Admitting: *Deleted

## 2013-07-30 NOTE — Telephone Encounter (Signed)
Called Care Manager with [facility] regarding appt for Tonya Sullivan. I left my name and phone number to call with any questions.

## 2013-07-31 ENCOUNTER — Ambulatory Visit (HOSPITAL_COMMUNITY)
Admission: RE | Admit: 2013-07-31 | Discharge: 2013-07-31 | Disposition: A | Discharge status: Home / Self Care | Payer: Medicare Other | Source: Ambulatory Visit | Attending: Surgery | Admitting: Surgery

## 2013-07-31 DIAGNOSIS — R948 Abnormal results of function studies of other organs and systems: Secondary | ICD-10-CM | POA: Diagnosis not present

## 2013-07-31 DIAGNOSIS — I2510 Atherosclerotic heart disease of native coronary artery without angina pectoris: Secondary | ICD-10-CM | POA: Diagnosis not present

## 2013-07-31 DIAGNOSIS — R918 Other nonspecific abnormal finding of lung field: Secondary | ICD-10-CM | POA: Diagnosis not present

## 2013-07-31 DIAGNOSIS — Z4789 Encounter for other orthopedic aftercare: Secondary | ICD-10-CM | POA: Insufficient documentation

## 2013-07-31 DIAGNOSIS — R222 Localized swelling, mass and lump, trunk: Secondary | ICD-10-CM | POA: Diagnosis present

## 2013-07-31 LAB — GLUCOSE, CAPILLARY: Glucose-Capillary: 101 mg/dL — ABNORMAL HIGH (ref 70–99)

## 2013-07-31 MED ORDER — FLUDEOXYGLUCOSE F - 18 (FDG) INJECTION
9.7000 | Freq: Once | INTRAVENOUS | Status: AC | PRN
  Administered 2013-07-31: 9.7 via INTRAVENOUS

## 2013-08-04 ENCOUNTER — Ambulatory Visit (HOSPITAL_COMMUNITY)
Admission: RE | Admit: 2013-08-04 | Discharge: 2013-08-04 | Disposition: A | Discharge status: Home / Self Care | Payer: Medicare Other | Source: Ambulatory Visit | Attending: Surgery | Admitting: Surgery

## 2013-08-04 DIAGNOSIS — C3490 Malignant neoplasm of unspecified part of unspecified bronchus or lung: Secondary | ICD-10-CM | POA: Insufficient documentation

## 2013-08-04 DIAGNOSIS — R51 Headache: Secondary | ICD-10-CM | POA: Insufficient documentation

## 2013-08-04 DIAGNOSIS — F29 Unspecified psychosis not due to a substance or known physiological condition: Secondary | ICD-10-CM | POA: Insufficient documentation

## 2013-08-04 DIAGNOSIS — G319 Degenerative disease of nervous system, unspecified: Secondary | ICD-10-CM | POA: Diagnosis not present

## 2013-08-04 DIAGNOSIS — C8589 Other specified types of non-Hodgkin lymphoma, extranodal and solid organ sites: Secondary | ICD-10-CM | POA: Diagnosis not present

## 2013-08-04 DIAGNOSIS — R29898 Other symptoms and signs involving the musculoskeletal system: Secondary | ICD-10-CM | POA: Insufficient documentation

## 2013-08-04 DIAGNOSIS — H0520 Unspecified exophthalmos: Secondary | ICD-10-CM | POA: Diagnosis not present

## 2013-08-04 DIAGNOSIS — Z853 Personal history of malignant neoplasm of breast: Secondary | ICD-10-CM | POA: Diagnosis not present

## 2013-08-04 DIAGNOSIS — R222 Localized swelling, mass and lump, trunk: Secondary | ICD-10-CM

## 2013-08-04 MED ORDER — GADOBENATE DIMEGLUMINE 529 MG/ML IV SOLN
20.0000 mL | Freq: Once | INTRAVENOUS | Status: AC | PRN
  Administered 2013-08-04: 16 mL via INTRAVENOUS

## 2013-08-05 ENCOUNTER — Other Ambulatory Visit: Payer: Self-pay | Admitting: *Deleted

## 2013-08-05 DIAGNOSIS — R918 Other nonspecific abnormal finding of lung field: Secondary | ICD-10-CM

## 2013-08-06 ENCOUNTER — Encounter: Payer: Self-pay | Admitting: Internal Medicine

## 2013-08-06 ENCOUNTER — Encounter: Payer: Self-pay | Admitting: *Deleted

## 2013-08-06 ENCOUNTER — Other Ambulatory Visit (HOSPITAL_BASED_OUTPATIENT_CLINIC_OR_DEPARTMENT_OTHER): Payer: Medicare Other

## 2013-08-06 ENCOUNTER — Ambulatory Visit: Payer: Medicare Other | Attending: Surgery | Admitting: Physical Therapy

## 2013-08-06 ENCOUNTER — Ambulatory Visit
Admission: RE | Admit: 2013-08-06 | Discharge: 2013-08-06 | Disposition: A | Discharge status: Home / Self Care | Payer: Medicare Other | Source: Ambulatory Visit | Attending: Radiation Oncology | Admitting: Radiation Oncology

## 2013-08-06 ENCOUNTER — Ambulatory Visit (HOSPITAL_BASED_OUTPATIENT_CLINIC_OR_DEPARTMENT_OTHER): Payer: Medicare Other | Admitting: Internal Medicine

## 2013-08-06 VITALS — BP 130/82 | HR 87 | Temp 98.3°F | Resp 20 | Wt 196.5 lb

## 2013-08-06 VITALS — BP 130/82 | HR 97 | Temp 98.3°F | Resp 18 | Ht 67.0 in | Wt 196.5 lb

## 2013-08-06 DIAGNOSIS — E119 Type 2 diabetes mellitus without complications: Secondary | ICD-10-CM

## 2013-08-06 DIAGNOSIS — IMO0001 Reserved for inherently not codable concepts without codable children: Secondary | ICD-10-CM | POA: Insufficient documentation

## 2013-08-06 DIAGNOSIS — E079 Disorder of thyroid, unspecified: Secondary | ICD-10-CM

## 2013-08-06 DIAGNOSIS — M545 Low back pain, unspecified: Secondary | ICD-10-CM | POA: Insufficient documentation

## 2013-08-06 DIAGNOSIS — J449 Chronic obstructive pulmonary disease, unspecified: Secondary | ICD-10-CM

## 2013-08-06 DIAGNOSIS — C3491 Malignant neoplasm of unspecified part of right bronchus or lung: Secondary | ICD-10-CM

## 2013-08-06 DIAGNOSIS — R599 Enlarged lymph nodes, unspecified: Secondary | ICD-10-CM

## 2013-08-06 DIAGNOSIS — C3410 Malignant neoplasm of upper lobe, unspecified bronchus or lung: Secondary | ICD-10-CM

## 2013-08-06 DIAGNOSIS — R918 Other nonspecific abnormal finding of lung field: Secondary | ICD-10-CM

## 2013-08-06 DIAGNOSIS — R5381 Other malaise: Secondary | ICD-10-CM

## 2013-08-06 DIAGNOSIS — R269 Unspecified abnormalities of gait and mobility: Secondary | ICD-10-CM | POA: Insufficient documentation

## 2013-08-06 DIAGNOSIS — F319 Bipolar disorder, unspecified: Secondary | ICD-10-CM

## 2013-08-06 DIAGNOSIS — C3490 Malignant neoplasm of unspecified part of unspecified bronchus or lung: Secondary | ICD-10-CM | POA: Insufficient documentation

## 2013-08-06 DIAGNOSIS — E2749 Other adrenocortical insufficiency: Secondary | ICD-10-CM

## 2013-08-06 DIAGNOSIS — R5383 Other fatigue: Secondary | ICD-10-CM

## 2013-08-06 HISTORY — DX: Malignant neoplasm of unspecified part of unspecified bronchus or lung: C34.90

## 2013-08-06 LAB — COMPREHENSIVE METABOLIC PANEL (CC13)
ALK PHOS: 61 U/L (ref 40–150)
ALT: 11 U/L (ref 0–55)
AST: 15 U/L (ref 5–34)
Albumin: 3.3 g/dL — ABNORMAL LOW (ref 3.5–5.0)
Anion Gap: 10 mEq/L (ref 3–11)
BILIRUBIN TOTAL: 0.3 mg/dL (ref 0.20–1.20)
BUN: 8.7 mg/dL (ref 7.0–26.0)
CO2: 27 mEq/L (ref 22–29)
Calcium: 9.5 mg/dL (ref 8.4–10.4)
Chloride: 102 mEq/L (ref 98–109)
Creatinine: 1 mg/dL (ref 0.6–1.1)
GLUCOSE: 104 mg/dL (ref 70–140)
Potassium: 4.5 mEq/L (ref 3.5–5.1)
Sodium: 140 mEq/L (ref 136–145)
Total Protein: 7.2 g/dL (ref 6.4–8.3)

## 2013-08-06 LAB — CBC WITH DIFFERENTIAL/PLATELET
BASO%: 0.7 % (ref 0.0–2.0)
Basophils Absolute: 0.1 10*3/uL (ref 0.0–0.1)
EOS ABS: 0.1 10*3/uL (ref 0.0–0.5)
EOS%: 1.2 % (ref 0.0–7.0)
HCT: 44.6 % (ref 34.8–46.6)
HGB: 14 g/dL (ref 11.6–15.9)
LYMPH%: 17.5 % (ref 14.0–49.7)
MCH: 28.7 pg (ref 25.1–34.0)
MCHC: 31.4 g/dL — ABNORMAL LOW (ref 31.5–36.0)
MCV: 91.3 fL (ref 79.5–101.0)
MONO#: 0.4 10*3/uL (ref 0.1–0.9)
MONO%: 3.9 % (ref 0.0–14.0)
NEUT%: 76.7 % (ref 38.4–76.8)
NEUTROS ABS: 7.1 10*3/uL — AB (ref 1.5–6.5)
Platelets: 268 10*3/uL (ref 145–400)
RBC: 4.89 10*6/uL (ref 3.70–5.45)
RDW: 15 % — ABNORMAL HIGH (ref 11.2–14.5)
WBC: 9.3 10*3/uL (ref 3.9–10.3)
lymph#: 1.6 10*3/uL (ref 0.9–3.3)

## 2013-08-06 MED ORDER — PROCHLORPERAZINE MALEATE 10 MG PO TABS: 10.0000 mg | ORAL_TABLET | Freq: Four times a day (QID) | ORAL | Status: DC | PRN

## 2013-08-06 NOTE — Progress Notes (Signed)
[place] [phone]   [phone]  CONSULT NOTE  REFERRING PHYSICIAN: Dr. Gilford Raid  REASON FOR CONSULTATION:  58 years old white female recently diagnosed with lung cancer  HPI Tonya Sullivan is a 58 y.o. female was past medical history is significant for hypertension, dyslipidemia, COPD, depression, bipolar disorder, chronic back pain currently handled by the pain clinic, TIA, GERD as well as PTSD and questionable diabetes mellitus. The patient also has a long history of smoking. She has been complaining of worsening dyspnea as well as cough and fatigue. Chest x-ray on 07/09/2013 showed right hilar mass or lymphadenopathy was mild peripheral right upper lobe atelectasis and a right upper lobe nodular density suspicious for small bronchogenic carcinoma. CT scan of the chest was performed on 07/15/2013 and it showed evidence of a large right hilar mass which measures 3.3 x 3.1 cm in dimension. Additionally there is a smaller parenchymal lesion in the right upper lobe measuring 12 mm in greatest dimension the hilar component extends into the mediastinum anterior to the carina and there is a focal area of extension or lymphadenopathy which measures 2.6 x 1.7 cm in greatest dimension. The patient was referred to Dr. Cyndia Bent and on 07/24/2013 she underwent a video bronchoscopy with biopsy, brushing and washing of the right upper lobe, endobronchial ultrasound with biopsy of the precarinal lymph node mass.  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 There was also a large right hilar nodal mass measuring approximately 5.3 x 3.0 CM and is markedly hypermetabolic with SUV max of 03.5. This mass extends towards the hilum  immediately anterior to the right mainstem bronchus in the region of the right tracheobronchial angle lymph nodes but does not extend to above the level of the carina. There is no other evidence for metastatic disease outside the chest.  MRI of the brain on 08/04/2013 on 08/04/2013 showed no evidence of intracranial metastatic disease. Dr. Cyndia Bent kindly referred the patient to me today for evaluation and discussion of her treatment options. When seen today she continues to complain of fatigue as well as the chronic back pain. She had several back surgeries in the past and followed by the pain clinic. She also has shortness breath with exertion and cough productive of yellowish sputum. She denied having any significant chest pain. She has no significant weight loss or night sweats. Family history significant for multiple medical issues. Her father had throat cancer and mother still alive at age 45. The patient is a widow and has one son, Tonya Sullivan. She used to do clerical work in office. She has a history of smoking one pack per day for around 40 years and she is currently smoking half a pack per day. I strongly encouraged her to quit smoking and also to smoke cessation program. The patient has no history of alcohol or drug abuse. HPI  Past Medical History  Diagnosis Date  . COPD (chronic obstructive pulmonary disease)   . Depression   . Chronic back pain     DDD, disc bulge, radiculopathy, spinal stenosis  . Hyperlipidemia   . Anxiety   . Bipolar disorder, unspecified   . Carotid artery calcification   . DDD (degenerative disc disease), lumbosacral   . Spinal stenosis, lumbar   .  Thyroid disease   . Autoimmune thyroiditis   . Hypertension   . TIA (transient ischemic attack)     august 2014  . GERD (gastroesophageal reflux disease)   . H/O dizziness   . Neuropathic pain   . Adrenal gland dysfunction     Addison's disease ON DAILY PREDNISONE  . Addison's disease   . Stroke     HX OF MINI  STROKE AUG 2014- DROPPIN EVERYTHING, SPEECH SLURRED, MEMORY AFFECTED --ALL RESOLVED - NO DEFICITS NOW  . Pneumonia     LAST PNEUMONIA WAS AUG 2014  . Achalasia   . Mouth ulcers   . Dysrhythmia     palpitations  . Melanoma     removed 1 week ago at Dr Teryl Lucy office  . Addison's disease   . Frequent falls   . 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 Not taking meds at this time    Past Surgical History  Procedure Laterality Date  . Appendectomy    . Cholecystectomy    . Lump left breast      removed- benign  . Abdominal hysterectomy      tubal pregnancy  . Inguinal hernia repair    . Stomach surgery      ?holes in esophgous  . Ectopic pregnancy surgery    . Back surgery      x 5;1984;1989;;1999;2000;2010  . Colonoscopy  01/25/2012    Procedure: COLONOSCOPY;  Surgeon: Rogene Houston, MD;  Location: [facility];  Service: Endoscopy;  Laterality: N/A;  130  . Esophagogastroduodenoscopy (egd) with esophageal dilation N/A 08/13/2012    Procedure: ESOPHAGOGASTRODUODENOSCOPY (EGD) WITH ESOPHAGEAL DILATION;  Surgeon: Rogene Houston, MD;  Location: [facility];  Service: Endoscopy;  Laterality: N/A;  315  . Anterior cervical decomp/discectomy fusion N/A 10/21/2012    Procedure: Cervical Six-Seven Anterior cervical decompression/diskectomy/fusion;  Surgeon: Kristeen Miss, MD;  Location: [facility];  Service: Neurosurgery;  Laterality: N/A;  Cervical Six-Seven Anterior cervical decompression/diskectomy/fusion  . Cataract extraction w/phaco Right 11/25/2012    Procedure: CATARACT EXTRACTION PHACO AND INTRAOCULAR LENS PLACEMENT (IOC);  Surgeon: Elta Guadeloupe T. Gershon Crane, MD;  Location: [facility];  Service: Ophthalmology;  Laterality: Right;  CDE:10.06  . Cataract extraction w/phaco Left 12/09/2012    Procedure: CATARACT EXTRACTION PHACO AND INTRAOCULAR LENS PLACEMENT (IOC);  Surgeon: Elta Guadeloupe T. Gershon Crane, MD;  Location: [facility];  Service: Ophthalmology;  Laterality: Left;  CDE:5.06  .  Tonsillectomy    . Hiatal hernia repair    . Heller myotomy N/A 03/17/2013    Procedure: DIAGNOSTIC LAPAROSCOPY, LAPAROSCOPIC HELLER MYOTOMY, ENDOSCOPY, DOR FUNDOPLICATION;  Surgeon: Ralene Ok, MD;  Location: [facility];  Service: General;  Laterality: N/A;  . Breast surgery      left breast  . Eye surgery    . Video bronchoscopy with endobronchial ultrasound N/A 07/24/2013    Procedure: VIDEO BRONCHOSCOPY WITH ENDOBRONCHIAL ULTRASOUND;  Surgeon: [name], MD;  Location: [facility];  Service: Thoracic;  Laterality: N/A;    Family History  Problem Relation Age of Onset  . Hypertension Mother   . Heart disease Mother   . Heart attack Mother   . Anxiety disorder Mother   . Cancer Father     lung cancer  . Heart attack Father   . Alcohol abuse Father   . Hypertension Brother   . Alcohol abuse Brother   . Bipolar disorder Brother   . ADD / ADHD Brother   .  Drug abuse Brother   . OCD Brother   . Alcohol abuse Sister   . Bipolar disorder Sister   . Anxiety disorder Sister   . ADD / ADHD Sister   . Drug abuse Sister   . Physical abuse Sister   . Sexual abuse Sister   . Alcohol abuse Brother   . Bipolar disorder Brother   . ADD / ADHD Brother   . Drug abuse Brother   . Alcohol abuse Sister   . Bipolar disorder Sister   . Anxiety disorder Sister   . ADD / ADHD Sister   . Physical abuse Sister   . Sexual abuse Sister   . Alcohol abuse Brother   . Bipolar disorder Brother   . ADD / ADHD Brother   . Bipolar disorder Brother   . ADD / ADHD Brother   . Alcohol abuse Brother   . Paranoid behavior Brother   . Physical abuse Brother   . Sexual abuse Brother   . Bipolar disorder Maternal Aunt   . Bipolar disorder Paternal Aunt   . Bipolar disorder Maternal Uncle   . Bipolar disorder Paternal Uncle   . Bipolar disorder Maternal Grandfather   . Alcohol abuse Maternal Grandfather   . Bipolar disorder Maternal Grandmother   . Alcohol abuse Maternal Grandmother   . Anxiety  disorder Maternal Grandmother   . Dementia Maternal Grandmother   . Bipolar disorder Paternal Grandfather   . Alcohol abuse Paternal Grandfather   . Bipolar disorder Paternal Grandmother   . Alcohol abuse Paternal Grandmother   . Drug abuse Paternal Grandmother   . Bipolar disorder Maternal Uncle   . Schizophrenia Neg Hx   . Seizures Neg Hx     Social History History  Substance Use Topics  . Smoking status: Current Some Day Smoker -- 0.25 packs/day for 40 years    Types: Cigarettes, E-cigarettes  . Smokeless tobacco: Never Used     Comment: 5 cigs day 03/12/13  . Alcohol Use: No    Allergies  Allergen Reactions  . Codeine Itching  . Cyclobenzaprine Hives  . Darvocet [Propoxyphene N-Acetaminophen] Rash  . Abilify [Aripiprazole] Other (See Comments)    tremors  . Ace Inhibitors Other (See Comments)    unknown  . Adhesive [Tape] Hives  . Gabapentin Swelling  . Iodine Hives  . Levofloxacin Hives and Hypertension  . Prednisone Hives, Itching and Hypertension  . Sulfa Antibiotics Itching  . Robaxin [Methocarbamol] Rash    Current Outpatient Prescriptions  Medication Sig Dispense Refill  . albuterol (PROVENTIL HFA;VENTOLIN HFA) 108 (90 BASE) MCG/ACT inhaler Inhale 2 puffs into the lungs 3 (three) times daily as needed for wheezing or shortness of breath.      Marland Kitchen albuterol (PROVENTIL) (2.5 MG/3ML) 0.083% nebulizer solution Take 3 mLs (2.5 mg total) by nebulization every 4 (four) hours as needed for wheezing.  75 mL  12  . amitriptyline (ELAVIL) 100 MG tablet Take 1 tablet (100 mg total) by mouth at bedtime.  30 tablet  2  . baclofen (LIORESAL) 10 MG tablet Take 5 mg by mouth daily.      . clonazePAM (KLONOPIN) 1 MG tablet Take 1 tablet (1 mg total) by mouth 4 (four) times daily.  120 tablet  2  . divalproex (DEPAKOTE ER) 500 MG 24 hr tablet Take 1 tablet (500 mg total) by mouth daily.  30 tablet  1  . donepezil (ARICEPT) 5 MG tablet Take 1 tablet (5 mg total) by mouth at  bedtime.  30 tablet  3  . DULoxetine (CYMBALTA) 60 MG capsule Take 1 capsule (60 mg total) by mouth every morning.  30 capsule  2  . levothyroxine (SYNTHROID, LEVOTHROID) 25 MCG tablet Take 25 mcg by mouth daily before breakfast.      . lidocaine (LIDODERM) 5 % Place 1 patch onto the skin every 12 (twelve) hours.  30 patch  0  . metoprolol tartrate (LOPRESSOR) 25 MG tablet Take 12.5 mg by mouth 2 (two) times daily with a meal.      . Multiple Vitamins-Minerals (HAIR/SKIN/NAILS/BIOTIN PO) Take 1 tablet by mouth daily.      . pantoprazole (PROTONIX) 40 MG tablet Take 1 tablet (40 mg total) by mouth daily.  30 tablet  3  . pravastatin (PRAVACHOL) 40 MG tablet Take 1 tablet (40 mg total) by mouth every evening.  30 tablet  2  . predniSONE (DELTASONE) 2.5 MG tablet Take 3 tablets (7.5 mg total) by mouth daily with breakfast.      . tiotropium (SPIRIVA) 18 MCG inhalation capsule Place 1 capsule (18 mcg total) into inhaler and inhale daily.  30 capsule  12  . oxyCODONE-acetaminophen (PERCOCET) 10-325 MG per tablet Take 1 tablet by mouth every 4 (four) hours as needed for pain.      Marland Kitchen prochlorperazine (COMPAZINE) 10 MG tablet Take 1 tablet (10 mg total) by mouth every 6 (six) hours as needed for nausea or vomiting.  60 tablet  0  . [DISCONTINUED] Gabapentin, PHN, (GRALISE STARTER) 300 & 600 MG MISC Take 300-1,800 mg by mouth as directed. 15 DAY Starter pack doses     DAY 1- 300 mg     DAY 2- 600 mg     DAY 3 to 6 - 900 mg    DAY 7 to 10 - 1200 mg     DAY 11 to 14 - 1500 mg      DAY 15 - 1800 mg        No current facility-administered medications for this visit.    Review of Systems  Constitutional: positive for fatigue Eyes: negative Ears, nose, mouth, throat, and face: negative Respiratory: positive for cough and dyspnea on exertion Cardiovascular: negative Gastrointestinal: negative Genitourinary:negative Integument/breast: negative Hematologic/lymphatic: negative Musculoskeletal:positive for  back pain Neurological: negative Behavioral/Psych: negative Endocrine: negative Allergic/Immunologic: negative  Physical Exam  LFY:BOFBP, healthy, no distress, well nourished, well developed and anxious SKIN: skin color, texture, turgor are normal, no rashes or significant lesions HEAD: Normocephalic, No masses, lesions, tenderness or abnormalities EYES: normal, PERRLA EARS: External ears normal, Canals clear OROPHARYNX:no exudate, no erythema and lips, buccal mucosa, and tongue normal  NECK: supple, no adenopathy, no JVD LYMPH:  no palpable lymphadenopathy, no hepatosplenomegaly BREAST:not examined LUNGS: clear to auscultation , and palpation HEART: regular rate & rhythm, no murmurs and no gallops ABDOMEN:abdomen soft, non-tender, obese, normal bowel sounds and no masses or organomegaly BACK: Back symmetric, no curvature., No CVA tenderness EXTREMITIES:no joint deformities, effusion, or inflammation, no edema, no skin discoloration  NEURO: alert & oriented x 3 with fluent speech, no focal motor/sensory deficits  PERFORMANCE STATUS: ECOG 1  LABORATORY DATA: Lab Results  Component Value Date   WBC 9.3 08/06/2013   HGB 14.0 08/06/2013   HCT 44.6 08/06/2013   MCV 91.3 08/06/2013   PLT 268 08/06/2013      Chemistry      Component Value Date/Time   NA 140 08/06/2013 1235   NA 142 07/24/2013 0638   K 4.5  08/06/2013 1235   K 4.6 07/24/2013 0638   CL 101 07/24/2013 0638   CO2 27 08/06/2013 1235   CO2 32 07/24/2013 0638   BUN 8.7 08/06/2013 1235   BUN 15 07/24/2013 0638   CREATININE 1.0 08/06/2013 1235   CREATININE 0.87 07/24/2013 0638   CREATININE 0.86 07/15/2013 1311      Component Value Date/Time   CALCIUM 9.5 08/06/2013 1235   CALCIUM 9.1 07/24/2013 0638   ALKPHOS 61 08/06/2013 1235   ALKPHOS 55 07/24/2013 0638   AST 15 08/06/2013 1235   AST 14 07/24/2013 0638   ALT 11 08/06/2013 1235   ALT 10 07/24/2013 0638   BILITOT 0.30 08/06/2013 1235   BILITOT <0.2* 07/24/2013 1308       RADIOGRAPHIC  STUDIES: Dg Chest 2 View Within Previous 72 Hours.  Films Obtained On Friday Are Acceptable For Monday And Tuesday Cases  07/24/2013   CLINICAL DATA:  Preoperative chest radiograph for lung surgery. Cough. History of smoking. Known right hilar mass.  EXAM: CHEST  2 VIEW  COMPARISON:  Chest radiograph performed 07/09/2013, and CT of the chest performed 07/14/2013  FINDINGS: The patient's right hilar mass is again noted. A more peripheral pulmonary nodule is again seen within the right upper lobe. The lungs are otherwise grossly clear. No pleural effusion or pneumothorax is seen.  The heart is normal in size. No acute osseous abnormalities are identified. Cervical spinal fusion hardware is noted. Clips are noted within the right upper quadrant, reflecting prior cholecystectomy.  IMPRESSION: Right hilar mass again noted. More peripheral pulmonary nodule again seen within the right upper lobe. Lungs otherwise clear.   Electronically Signed   By: Garald Balding M.D.   On: 07/24/2013 06:22   Dg Chest 2 View  07/09/2013   CLINICAL DATA:  Cough.  Shortness of breath.  Smoker.  EXAM: CHEST  2 VIEW  COMPARISON:  06/11/2013  FINDINGS: Right hilar mass or lymphadenopathy now visualized, with mild atelectasis in the inferior aspect of the right upper lobe. An ill-defined nodular density is noted in the right upper lobe measuring approximately 1.5 cm, which may represent a small bronchogenic carcinoma.  Left lung is clear. No evidence pleural effusion. Heart size is normal.  IMPRESSION: Right hilar mass or lymphadenopathy, with mild peripheral right upper lobe atelectasis. Right upper lobe nodular density also seen, possibly representing a small bronchogenic carcinoma. Chest CT with contrast recommended for further evaluation.   Electronically Signed   By: Earle Gell M.D.   On: 07/09/2013 01:14   Ct Chest W Contrast  07/14/2013   CLINICAL DATA:  Cough, possible right hilar mass  EXAM: CT CHEST WITH CONTRAST  TECHNIQUE:  Multidetector CT imaging of the chest was performed during intravenous contrast administration.  CONTRAST:  116mL OMNIPAQUE IOHEXOL 300 MG/ML  SOLN  COMPARISON:  07/09/2013  FINDINGS: The lungs are well aerated bilaterally. The left lung shows no focal infiltrate or sizable parenchymal nodule. The right lung shows evidence of a large right hilar mass which measures 3.3 x 3.1 cm in dimension. Additionally there is a smaller parenchymal lesion in the right upper lobe measuring 12 mm in greatest dimension the hilar component extends into the mediastinum anterior to the carina and there is a focal area of extension or lymphadenopathy which measures 2.6 x 1.7 cm in greatest dimension. No other significant hilar or mediastinal adenopathy is seen. No vascular ingrowth is noted.  The bony structures show no definitive metastatic foci.  The upper abdomen  is within normal limits.  IMPRESSION: Large right hilar mass with evidence of local extension/ adenopathy the anterior to the carina. Additionally a peripheral 12 mm lesion is noted within the right upper lobe. These changes are consistent with primary pulmonary neoplasm. Tissue sampling is recommended.   Electronically Signed   By: Inez Catalina M.D.   On: 07/14/2013 15:37   Mr Jeri Cos EX Contrast  08/04/2013   CLINICAL DATA:  History of breast cancer, small cell lung cancer and lymphoma. Headaches, nausea and confusion with right-sided weakness.  EXAM: MRI HEAD WITHOUT AND WITH CONTRAST  TECHNIQUE: Multiplanar, multiecho pulse sequences of the brain and surrounding structures were obtained without and with intravenous contrast.  CONTRAST:  54mL MULTIHANCE GADOBENATE DIMEGLUMINE 529 MG/ML IV SOLN  COMPARISON:  05/23/2013 CT.  04/01/2013 MR.  FINDINGS: Some of the sequences are motion degraded.  No intracranial mass, abnormal enhanced or bony destructive lesion detected to suggest the presence of intracranial metastatic disease  No acute infarct.  No intracranial  hemorrhage.  Very mild nonspecific white matter type changes may represent result of small vessel disease.  Mild atrophy without hydrocephalus.  Mild exophthalmos.  Mild cervical spondylotic changes C4-5. Cervical medullary junction, pituitary region and pineal region unremarkable.  Major intracranial vascular structures are patent.  IMPRESSION: Some of the sequences are motion degraded.  No evidence of intracranial metastatic disease  No acute infarct.  Very mild nonspecific white matter type changes may represent result of small vessel disease.  Mild atrophy   Electronically Signed   By: Chauncey Cruel M.D.   On: 08/04/2013 13:43   Nm Pet Image Initial (pi) Skull Base To Thigh  07/31/2013   CLINICAL DATA:  Initial treatment strategy for right upper lobe mass.  EXAM: NUCLEAR MEDICINE PET SKULL BASE TO THIGH  TECHNIQUE: 9.7 mCi F-18 FDG was injected intravenously. Full-ring PET imaging was performed from the skull base to thigh after the radiotracer. CT data was obtained and used for attenuation correction and anatomic localization.  FASTING BLOOD GLUCOSE:  Value: 101 mg/dl  COMPARISON:  Chest CT 07/14/2013.  FINDINGS: NECK  No hypermetabolic lymph nodes in the neck. There is a small amount of physiologic muscular activity within the right mass inter- and left scalene muscles.  CHEST  Previously noted right upper lobe pulmonary nodule currently measures 1.5 x 1.0 cm and is hypermetabolic (SUVmax = 8.4). No other suspicious appearing nodules in the other lobes of the lungs. Large right hilar nodal mass currently measures approximately 5.3 x 3.0 cm cm, and is markedly hypermetabolic (SUVmax = 93.7). This mass extends toward the hilum immediately anterior to the right mainstem bronchus in the region of the right tracheobronchial angle nodes (10R), but does not extend to or above the level of the carina (i.e., there is no definite low right paratracheal lymph node involvement). No additional enlarged or hypermetabolic  mediastinal or left hilar lymph nodes are noted. Heart size is normal. There is atherosclerosis of the thoracic aorta, the great vessels of the mediastinum and the coronary arteries, including calcified atherosclerotic plaque in the left main, left anterior descending and left circumflex coronary arteries.  ABDOMEN/PELVIS  No abnormal hypermetabolic activity within the liver, pancreas, adrenal glands, or spleen. No hypermetabolic lymph nodes in the abdomen or pelvis. Status post cholecystectomy. Status post hysterectomy. Numerous colonic diverticulae are noted, particularly in the region of the sigmoid colon, without surrounding inflammatory changes to suggest an acute diverticulitis at this time.  SKELETON  Healing nondisplaced fractures  of the anterior aspects of the right fifth and sixth ribs are noted, both of which demonstrate low-level hypermetabolism (physiologic). No other focal hypermetabolic activity to suggest skeletal metastasis. Multiple nonspecific areas of low-level metabolic activity within the skeletal muscles, particularly the in the adductor compartments of the upper thighs and in the right buttock region, presumably physiologic.  IMPRESSION: 1. Findings, as above, compatible with a primary bronchogenic neoplasm. Today's study demonstrates T1a, N1, Mx disease (i.e., likely stage IIA). 2. Healing nondisplaced fractures of the anterior aspects of the right fifth and sixth ribs. 3. Atherosclerosis, including left main and 2 vessel coronary artery disease. Please note that although the presence of coronary artery calcium documents the presence of coronary artery disease, the severity of this disease and any potential stenosis cannot be assessed on this non-gated CT examination. Assessment for potential risk factor modification, dietary therapy or pharmacologic therapy may be warranted, if clinically indicated. 4. Additional incidental findings, as above.   Electronically Signed   By: Vinnie Langton  M.D.   On: 07/31/2013 14:53   Dg Chest Port 1 View  07/24/2013   CLINICAL DATA:  58 year old female status post bronchoscopy with multiple biopsies. Initial encounter.  EXAM: PORTABLE CHEST - 1 VIEW  COMPARISON:  0509 hr the same day and earlier.  FINDINGS: Portable AP semi upright view at 0941 hrs. No pneumothorax. Mildly lower lung volumes with increased crowding of lung base markings. Stable platelike opacity at the right hilum. Stable cardiac size and mediastinal contours. Visualized tracheal air column is within normal limits. No pleural effusion. No new confluent pulmonary opacity.  IMPRESSION: Mildly lower lung volumes status post bronchoscopy, no pneumothorax or adverse features identified.   Electronically Signed   By: Lars Pinks M.D.   On: 07/24/2013 09:49    ASSESSMENT: This is a very pleasant 58 years old white female recently diagnosed with limited stage(T1a, N1, M0) small cell lung cancer presenting with right upper lobe lung nodule as well as large right hilar lymphadenopathy diagnosed in July of 2015.   PLAN: I had a lengthy discussion with the patient and her son today about her current disease stage, prognosis and treatment options. I recommended for the patient a course of systemic chemotherapy in the form of cisplatin 60 mg/M2 on day 1 and etoposide at 120 mg/M2 on days 1, 2 and 3 with Neulasta support on day 4. This would be concurrent with radiotherapy starting with either cycle 1 or cycle 2. I discussed with the patient adverse effect of the chemotherapy including but not limited to alopecia, myelosuppression, nausea and vomiting, peripheral neuropathy, liver or renal dysfunction. The patient is interested in proceeding with the treatment as planned. She is expected to start the first cycle of this treatment next week. She will be seen later today by Dr. Pablo Ledger for evaluation and discussion of the radiotherapy option. I will arrange for the patient to have a chemotherapy education  class before starting the first cycle of her treatment. I will call her pharmacy with prescription for Compazine 10 mg by mouth every 6 hours as needed for nausea. The patient would come back for followup visit in 2 weeks for reevaluation and management any adverse effect of her treatment. She was advised to call immediately if she has any concerning symptoms in the interval. The patient was seen during her multidisciplinary thoracic oncology clinic today by medical oncology, thoracic navigator, physical therapist and social worker. The patient voices understanding of current disease status and treatment options  and is in agreement with the current care plan.  All questions were answered. The patient knows to call the clinic with any problems, questions or concerns. We can certainly see the patient much sooner if necessary.  Thank you so much for allowing me to participate in the care of [place]. I will continue to follow up the patient with you and assist in her care.  I spent 55 minutes counseling the patient face to face. The total time spent in the appointment was 80 minutes.  Disclaimer: This note was dictated with voice recognition software. Similar sounding words can inadvertently be transcribed and may not be corrected upon review.   Deatra Mcmahen K. 08/06/2013, 2:12 PM

## 2013-08-06 NOTE — Patient Instructions (Signed)
Smoking Cessation, Tips for Success  If you are ready to quit smoking, congratulations! You have chosen to help yourself be healthier. Cigarettes bring nicotine, tar, carbon monoxide, and other irritants into your body. Your lungs, heart, and blood vessels will be able to work better without these poisons. There are many different ways to quit smoking. Nicotine gum, nicotine patches, a nicotine inhaler, or nicotine nasal spray can help with physical craving. Hypnosis, support groups, and medicines help break the habit of smoking.  WHAT THINGS CAN I DO TO MAKE QUITTING EASIER?   Here are some tips to help you quit for good:  · Pick a date when you will quit smoking completely. Tell all of your friends and family about your plan to quit on that date.  · Do not try to slowly cut down on the number of cigarettes you are smoking. Pick a quit date and quit smoking completely starting on that day.  · Throw away all cigarettes.    · Clean and remove all ashtrays from your home, work, and car.  · On a card, write down your reasons for quitting. Carry the card with you and read it when you get the urge to smoke.  · Cleanse your body of nicotine. Drink enough water and fluids to keep your urine clear or pale yellow. Do this after quitting to flush the nicotine from your body.  · Learn to predict your moods. Do not let a bad situation be your excuse to have a cigarette. Some situations in your life might tempt you into wanting a cigarette.  · Never have "just one" cigarette. It leads to wanting another and another. Remind yourself of your decision to quit.  · Change habits associated with smoking. If you smoked while driving or when feeling stressed, try other activities to replace smoking. Stand up when drinking your coffee. Brush your teeth after eating. Sit in a different chair when you read the paper. Avoid alcohol while trying to quit, and try to drink fewer caffeinated beverages. Alcohol and caffeine may urge you to  smoke.  · Avoid foods and drinks that can trigger a desire to smoke, such as sugary or spicy foods and alcohol.  · Ask people who smoke not to smoke around you.  · Have something planned to do right after eating or having a cup of coffee. For example, plan to take a walk or exercise.  · Try a relaxation exercise to calm you down and decrease your stress. Remember, you may be tense and nervous for the first 2 weeks after you quit, but this will pass.  · Find new activities to keep your hands busy. Play with a pen, coin, or rubber band. Doodle or draw things on paper.  · Brush your teeth right after eating. This will help cut down on the craving for the taste of tobacco after meals. You can also try mouthwash.    · Use oral substitutes in place of cigarettes. Try using lemon drops, carrots, cinnamon sticks, or chewing gum. Keep them handy so they are available when you have the urge to smoke.  · When you have the urge to smoke, try deep breathing.  · Designate your home as a nonsmoking area.  · If you are a heavy smoker, ask your health care provider about a prescription for nicotine chewing gum. It can ease your withdrawal from nicotine.  · Reward yourself. Set aside the cigarette money you save and buy yourself something nice.  · Look for   support from others. Join a support group or smoking cessation program. Ask someone at home or at work to help you with your plan to quit smoking.  · Always ask yourself, "Do I need this cigarette or is this just a reflex?" Tell yourself, "Today, I choose not to smoke," or "I do not want to smoke." You are reminding yourself of your decision to quit.  · Do not replace cigarette smoking with electronic cigarettes (commonly called e-cigarettes). The safety of e-cigarettes is unknown, and some may contain harmful chemicals.  · If you relapse, do not give up! Plan ahead and think about what you will do the next time you get the urge to smoke.  HOW WILL I FEEL WHEN I QUIT SMOKING?  You  may have symptoms of withdrawal because your body is used to nicotine (the addictive substance in cigarettes). You may crave cigarettes, be irritable, feel very hungry, cough often, get headaches, or have difficulty concentrating. The withdrawal symptoms are only temporary. They are strongest when you first quit but will go away within 10-14 days. When withdrawal symptoms occur, stay in control. Think about your reasons for quitting. Remind yourself that these are signs that your body is healing and getting used to being without cigarettes. Remember that withdrawal symptoms are easier to treat than the major diseases that smoking can cause.   Even after the withdrawal is over, expect periodic urges to smoke. However, these cravings are generally short lived and will go away whether you smoke or not. Do not smoke!  WHAT RESOURCES ARE AVAILABLE TO HELP ME QUIT SMOKING?  Your health care provider can direct you to community resources or hospitals for support, which may include:  · Group support.  · Education.  · Hypnosis.  · Therapy.  Document Released: 09/16/2003 Document Revised: 05/04/2013 Document Reviewed: 06/05/2012  [employer]® Patient Information ©2015 [employer]. This information is not intended to replace advice given to you by your health care provider. Make sure you discuss any questions you have with your health care provider.

## 2013-08-06 NOTE — Progress Notes (Signed)
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 female  With a 10 pack year smoking history who presented with shortness of breath, cough and fatigue. A CXR showed a right upper lobe mass. A CT was ordered which showed a 1.5 cm right upper lobe mass with hilar and mediastinal adenopathy. A PET CT confirmed these findings and did not show evidence of metastatic disease. Brain imaging was negative for metastatic disease. She has chronic back pain from many back surgeries and is followed by the pain clinic.  She has chronic dyspnea on exertion and productive cough. She is widowed (her husband died of lung cancer after receiving chemotherapy) and is accompanied by her son.  She is currently smoking a half a pack pf cigarettes per day. She denies any hemoptysis or headaches.   PREVIOUS RADIATION THERAPY: No  PAST MEDICAL HISTORY:  has a past medical history of COPD (chronic obstructive pulmonary disease); Depression; Chronic back pain; Hyperlipidemia; Anxiety; Bipolar disorder, unspecified; Carotid artery calcification; DDD (degenerative disc disease), lumbosacral; Spinal stenosis, lumbar; Thyroid disease; Autoimmune thyroiditis; Hypertension; TIA (transient ischemic attack); GERD (gastroesophageal reflux disease); H/O dizziness; Neuropathic pain; Adrenal gland dysfunction; Addison's disease; Stroke; Pneumonia; Achalasia; Mouth ulcers; Dysrhythmia; Melanoma; Addison's disease; Frequent falls; and DM (diabetes mellitus) with complications.    PAST SURGICAL HISTORY: Past Surgical History  Procedure Laterality Date  . Appendectomy    . Cholecystectomy    . Lump left breast      removed- benign  .  Abdominal hysterectomy      tubal pregnancy  . Inguinal hernia repair    . Stomach surgery      ?holes in esophgous  . Ectopic pregnancy surgery    . Back surgery      x 5;1984;1989;;1999;2000;2010  . Colonoscopy  01/25/2012    Procedure: COLONOSCOPY;  Surgeon: Rogene Houston, MD;  Location: [facility];  Service: Endoscopy;  Laterality: N/A;  130  . Esophagogastroduodenoscopy (egd) with esophageal dilation N/A 08/13/2012    Procedure: ESOPHAGOGASTRODUODENOSCOPY (EGD) WITH ESOPHAGEAL DILATION;  Surgeon: Rogene Houston, MD;  Location: [facility];  Service: Endoscopy;  Laterality: N/A;  315  . Anterior cervical decomp/discectomy fusion N/A 10/21/2012    Procedure: Cervical Six-Seven Anterior cervical decompression/diskectomy/fusion;  Surgeon: Kristeen Miss, MD;  Location: [place] [facility];  Service: Neurosurgery;  Laterality: N/A;  Cervical Six-Seven Anterior cervical decompression/diskectomy/fusion  . Cataract extraction w/phaco Right 11/25/2012    Procedure: CATARACT EXTRACTION PHACO AND INTRAOCULAR LENS PLACEMENT (IOC);  Surgeon: Elta Guadeloupe T. Gershon Crane, MD;  Location: [facility];  Service: Ophthalmology;  Laterality: Right;  CDE:10.06  . Cataract extraction w/phaco Left 12/09/2012    Procedure: CATARACT EXTRACTION PHACO AND INTRAOCULAR LENS PLACEMENT (IOC);  Surgeon: Elta Guadeloupe T. Gershon Crane, MD;  Location: [facility];  Service: Ophthalmology;  Laterality: Left;  CDE:5.06  . Tonsillectomy    . Hiatal hernia repair    . Heller myotomy N/A 03/17/2013    Procedure: DIAGNOSTIC LAPAROSCOPY, LAPAROSCOPIC HELLER MYOTOMY, ENDOSCOPY, DOR FUNDOPLICATION;  Surgeon: Ralene Ok, MD;  Location: [facility];  Service: General;  Laterality: N/A;  . Breast surgery      left breast  . Eye surgery    . Video bronchoscopy  with endobronchial ultrasound N/A 07/24/2013    Procedure: VIDEO BRONCHOSCOPY WITH ENDOBRONCHIAL ULTRASOUND;  Surgeon: [name], MD;  Location: [facility];  Service: Thoracic;  Laterality: N/A;    FAMILY HISTORY:    Family History  Problem Relation Age of Onset  . Hypertension Mother   . Heart disease Mother   . Heart attack Mother   . Anxiety disorder Mother   . Cancer Father     lung cancer  . Heart attack Father   . Alcohol abuse Father   . Hypertension Brother   . Alcohol abuse Brother   . Bipolar disorder Brother   . ADD / ADHD Brother   . Drug abuse Brother   . OCD Brother   . Alcohol abuse Sister   . Bipolar disorder Sister   . Anxiety disorder Sister   . ADD / ADHD Sister   . Drug abuse Sister   . Physical abuse Sister   . Sexual abuse Sister   . Alcohol abuse Brother   . Bipolar disorder Brother   . ADD / ADHD Brother   . Drug abuse Brother   . Alcohol abuse Sister   . Bipolar disorder Sister   . Anxiety disorder Sister   . ADD / ADHD Sister   . Physical abuse Sister   . Sexual abuse Sister   . Alcohol abuse Brother   . Bipolar disorder Brother   . ADD / ADHD Brother   . Bipolar disorder Brother   . ADD / ADHD Brother   . Alcohol abuse Brother   . Paranoid behavior Brother   . Physical abuse Brother   . Sexual abuse Brother   . Bipolar disorder Maternal Aunt   . Bipolar disorder Paternal Aunt   . Bipolar disorder Maternal Uncle   . Bipolar disorder Paternal Uncle   . Bipolar disorder Maternal Grandfather   . Alcohol abuse Maternal Grandfather   . Bipolar disorder Maternal Grandmother   . Alcohol abuse Maternal Grandmother   . Anxiety disorder Maternal Grandmother   . Dementia Maternal Grandmother   . Bipolar disorder Paternal Grandfather   . Alcohol abuse Paternal Grandfather   . Bipolar disorder Paternal Grandmother   . Alcohol abuse Paternal Grandmother   . Drug abuse Paternal Grandmother   . Bipolar disorder Maternal Uncle   . Schizophrenia Neg Hx   . Seizures Neg Hx     SOCIAL HISTORY:  History  Substance Use Topics  . Smoking status: Current Some Day Smoker -- 0.25 packs/day for 40 years    Types: Cigarettes, E-cigarettes    Last Attempt to Quit:  03/05/2013  . Smokeless tobacco: Never Used     Comment: 5 cigs day 03/12/13  . Alcohol Use: No    ALLERGIES: Codeine; Cyclobenzaprine; Darvocet; Abilify; Ace inhibitors; Adhesive; Gabapentin; Iodine; Levofloxacin; Prednisone; Sulfa antibiotics; and Robaxin  MEDICATIONS:  Current Outpatient Prescriptions  Medication Sig Dispense Refill  . albuterol (PROVENTIL HFA;VENTOLIN HFA) 108 (90 BASE) MCG/ACT inhaler Inhale 2 puffs into the lungs 3 (three) times daily as needed for wheezing or shortness of breath.      Marland Kitchen albuterol (PROVENTIL) (2.5 MG/3ML) 0.083% nebulizer solution Take 3 mLs (2.5 mg total) by nebulization every 4 (four) hours as needed for wheezing.  75 mL  12  . amitriptyline (ELAVIL) 100 MG tablet Take 1 tablet (100 mg total) by mouth at bedtime.  30 tablet  2  . baclofen (LIORESAL) 10 MG tablet Take 5 mg by mouth daily.      Marland Kitchen  clonazePAM (KLONOPIN) 1 MG tablet Take 1 tablet (1 mg total) by mouth 4 (four) times daily.  120 tablet  2  . divalproex (DEPAKOTE ER) 500 MG 24 hr tablet Take 1 tablet (500 mg total) by mouth daily.  30 tablet  1  . donepezil (ARICEPT) 5 MG tablet Take 1 tablet (5 mg total) by mouth at bedtime.  30 tablet  3  . DULoxetine (CYMBALTA) 60 MG capsule Take 1 capsule (60 mg total) by mouth every morning.  30 capsule  2  . levothyroxine (SYNTHROID, LEVOTHROID) 25 MCG tablet Take 25 mcg by mouth daily before breakfast.      . lidocaine (LIDODERM) 5 % Place 1 patch onto the skin every 12 (twelve) hours.  30 patch  0  . metoprolol tartrate (LOPRESSOR) 25 MG tablet Take 12.5 mg by mouth 2 (two) times daily with a meal.      . Multiple Vitamins-Minerals (HAIR/SKIN/NAILS/BIOTIN PO) Take 1 tablet by mouth daily.      Marland Kitchen oxyCODONE-acetaminophen (PERCOCET) 10-325 MG per tablet Take 1 tablet by mouth every 4 (four) hours as needed for pain.      . pantoprazole (PROTONIX) 40 MG tablet Take 1 tablet (40 mg total) by mouth daily.  30 tablet  3  . pravastatin (PRAVACHOL) 40 MG  tablet Take 1 tablet (40 mg total) by mouth every evening.  30 tablet  2  . predniSONE (DELTASONE) 2.5 MG tablet Take 3 tablets (7.5 mg total) by mouth daily with breakfast.      . tiotropium (SPIRIVA) 18 MCG inhalation capsule Place 1 capsule (18 mcg total) into inhaler and inhale daily.  30 capsule  12  . [DISCONTINUED] Gabapentin, PHN, (GRALISE STARTER) 300 & 600 MG MISC Take 300-1,800 mg by mouth as directed. 15 DAY Starter pack doses     DAY 1- 300 mg     DAY 2- 600 mg     DAY 3 to 6 - 900 mg    DAY 7 to 10 - 1200 mg     DAY 11 to 14 - 1500 mg      DAY 15 - 1800 mg        No current facility-administered medications for this encounter.    REVIEW OF SYSTEMS:  A 15 point review of systems is documented in the electronic medical record. This was obtained by the nursing staff. However, I reviewed this with the patient to discuss relevant findings and make appropriate changes.  Pertinent items are noted in HPI.  PHYSICAL EXAM: There were no vitals filed for this visit.Marland Kitchen Marland Kitchen Appears older than her stated age. Stands for exam due to back pain. NOrmal respiratory effort. Alert and oriented.   LABORATORY DATA:  Lab Results  Component Value Date   WBC 6.7 07/24/2013   HGB 13.4 07/24/2013   HCT 42.5 07/24/2013   MCV 93.4 07/24/2013   PLT 266 07/24/2013   Lab Results  Component Value Date   NA 142 07/24/2013   K 4.6 07/24/2013   CL 101 07/24/2013   CO2 32 07/24/2013   Lab Results  Component Value Date   ALT 10 07/24/2013   AST 14 07/24/2013   ALKPHOS 55 07/24/2013   BILITOT <0.2* 07/24/2013     RADIOGRAPHY: Dg Chest 2 View Within Previous 72 Hours.  Films Obtained On Friday Are Acceptable For Monday And Tuesday Cases  07/24/2013   CLINICAL DATA:  Preoperative chest radiograph for lung surgery. Cough. History of smoking. Known right hilar mass.  EXAM: CHEST  2 VIEW  COMPARISON:  Chest radiograph performed 07/09/2013, and CT of the chest performed 07/14/2013  FINDINGS: The patient's right hilar mass  is again noted. A more peripheral pulmonary nodule is again seen within the right upper lobe. The lungs are otherwise grossly clear. No pleural effusion or pneumothorax is seen.  The heart is normal in size. No acute osseous abnormalities are identified. Cervical spinal fusion hardware is noted. Clips are noted within the right upper quadrant, reflecting prior cholecystectomy.  IMPRESSION: Right hilar mass again noted. More peripheral pulmonary nodule again seen within the right upper lobe. Lungs otherwise clear.   Electronically Signed   By: Garald Balding M.D.   On: 07/24/2013 06:22   Dg Chest 2 View  07/09/2013   CLINICAL DATA:  Cough.  Shortness of breath.  Smoker.  EXAM: CHEST  2 VIEW  COMPARISON:  06/11/2013  FINDINGS: Right hilar mass or lymphadenopathy now visualized, with mild atelectasis in the inferior aspect of the right upper lobe. An ill-defined nodular density is noted in the right upper lobe measuring approximately 1.5 cm, which may represent a small bronchogenic carcinoma.  Left lung is clear. No evidence pleural effusion. Heart size is normal.  IMPRESSION: Right hilar mass or lymphadenopathy, with mild peripheral right upper lobe atelectasis. Right upper lobe nodular density also seen, possibly representing a small bronchogenic carcinoma. Chest CT with contrast recommended for further evaluation.   Electronically Signed   By: Earle Gell M.D.   On: 07/09/2013 01:14   Ct Chest W Contrast  07/14/2013   CLINICAL DATA:  Cough, possible right hilar mass  EXAM: CT CHEST WITH CONTRAST  TECHNIQUE: Multidetector CT imaging of the chest was performed during intravenous contrast administration.  CONTRAST:  154mL OMNIPAQUE IOHEXOL 300 MG/ML  SOLN  COMPARISON:  07/09/2013  FINDINGS: The lungs are well aerated bilaterally. The left lung shows no focal infiltrate or sizable parenchymal nodule. The right lung shows evidence of a large right hilar mass which measures 3.3 x 3.1 cm in dimension. Additionally  there is a smaller parenchymal lesion in the right upper lobe measuring 12 mm in greatest dimension the hilar component extends into the mediastinum anterior to the carina and there is a focal area of extension or lymphadenopathy which measures 2.6 x 1.7 cm in greatest dimension. No other significant hilar or mediastinal adenopathy is seen. No vascular ingrowth is noted.  The bony structures show no definitive metastatic foci.  The upper abdomen is within normal limits.  IMPRESSION: Large right hilar mass with evidence of local extension/ adenopathy the anterior to the carina. Additionally a peripheral 12 mm lesion is noted within the right upper lobe. These changes are consistent with primary pulmonary neoplasm. Tissue sampling is recommended.   Electronically Signed   By: Inez Catalina M.D.   On: 07/14/2013 15:37   Mr Jeri Cos UX Contrast  08/04/2013   CLINICAL DATA:  History of breast cancer, small cell lung cancer and lymphoma. Headaches, nausea and confusion with right-sided weakness.  EXAM: MRI HEAD WITHOUT AND WITH CONTRAST  TECHNIQUE: Multiplanar, multiecho pulse sequences of the brain and surrounding structures were obtained without and with intravenous contrast.  CONTRAST:  62mL MULTIHANCE GADOBENATE DIMEGLUMINE 529 MG/ML IV SOLN  COMPARISON:  05/23/2013 CT.  04/01/2013 MR.  FINDINGS: Some of the sequences are motion degraded.  No intracranial mass, abnormal enhanced or bony destructive lesion detected to suggest the presence of intracranial metastatic disease  No acute infarct.  No intracranial  hemorrhage.  Very mild nonspecific white matter type changes may represent result of small vessel disease.  Mild atrophy without hydrocephalus.  Mild exophthalmos.  Mild cervical spondylotic changes C4-5. Cervical medullary junction, pituitary region and pineal region unremarkable.  Major intracranial vascular structures are patent.  IMPRESSION: Some of the sequences are motion degraded.  No evidence of  intracranial metastatic disease  No acute infarct.  Very mild nonspecific white matter type changes may represent result of small vessel disease.  Mild atrophy   Electronically Signed   By: Chauncey Cruel M.D.   On: 08/04/2013 13:43   Nm Pet Image Initial (pi) Skull Base To Thigh  07/31/2013   CLINICAL DATA:  Initial treatment strategy for right upper lobe mass.  EXAM: NUCLEAR MEDICINE PET SKULL BASE TO THIGH  TECHNIQUE: 9.7 mCi F-18 FDG was injected intravenously. Full-ring PET imaging was performed from the skull base to thigh after the radiotracer. CT data was obtained and used for attenuation correction and anatomic localization.  FASTING BLOOD GLUCOSE:  Value: 101 mg/dl  COMPARISON:  Chest CT 07/14/2013.  FINDINGS: NECK  No hypermetabolic lymph nodes in the neck. There is a small amount of physiologic muscular activity within the right mass inter- and left scalene muscles.  CHEST  Previously noted right upper lobe pulmonary nodule currently measures 1.5 x 1.0 cm and is hypermetabolic (SUVmax = 8.4). No other suspicious appearing nodules in the other lobes of the lungs. Large right hilar nodal mass currently measures approximately 5.3 x 3.0 cm cm, and is markedly hypermetabolic (SUVmax = 56.4). This mass extends toward the hilum immediately anterior to the right mainstem bronchus in the region of the right tracheobronchial angle nodes (10R), but does not extend to or above the level of the carina (i.e., there is no definite low right paratracheal lymph node involvement). No additional enlarged or hypermetabolic mediastinal or left hilar lymph nodes are noted. Heart size is normal. There is atherosclerosis of the thoracic aorta, the great vessels of the mediastinum and the coronary arteries, including calcified atherosclerotic plaque in the left main, left anterior descending and left circumflex coronary arteries.  ABDOMEN/PELVIS  No abnormal hypermetabolic activity within the liver, pancreas, adrenal glands, or  spleen. No hypermetabolic lymph nodes in the abdomen or pelvis. Status post cholecystectomy. Status post hysterectomy. Numerous colonic diverticulae are noted, particularly in the region of the sigmoid colon, without surrounding inflammatory changes to suggest an acute diverticulitis at this time.  SKELETON  Healing nondisplaced fractures of the anterior aspects of the right fifth and sixth ribs are noted, both of which demonstrate low-level hypermetabolism (physiologic). No other focal hypermetabolic activity to suggest skeletal metastasis. Multiple nonspecific areas of low-level metabolic activity within the skeletal muscles, particularly the in the adductor compartments of the upper thighs and in the right buttock region, presumably physiologic.  IMPRESSION: 1. Findings, as above, compatible with a primary bronchogenic neoplasm. Today's study demonstrates T1a, N1, Mx disease (i.e., likely stage IIA). 2. Healing nondisplaced fractures of the anterior aspects of the right fifth and sixth ribs. 3. Atherosclerosis, including left main and 2 vessel coronary artery disease. Please note that although the presence of coronary artery calcium documents the presence of coronary artery disease, the severity of this disease and any potential stenosis cannot be assessed on this non-gated CT examination. Assessment for potential risk factor modification, dietary therapy or pharmacologic therapy may be warranted, if clinically indicated. 4. Additional incidental findings, as above.   Electronically Signed   By: Vinnie Langton  M.D.   On: 07/31/2013 14:53   Dg Chest Port 1 View  07/24/2013   CLINICAL DATA:  58 year old female status post bronchoscopy with multiple biopsies. Initial encounter.  EXAM: PORTABLE CHEST - 1 VIEW  COMPARISON:  0509 hr the same day and earlier.  FINDINGS: Portable AP semi upright view at 0941 hrs. No pneumothorax. Mildly lower lung volumes with increased crowding of lung base markings. Stable platelike  opacity at the right hilum. Stable cardiac size and mediastinal contours. Visualized tracheal air column is within normal limits. No pleural effusion. No new confluent pulmonary opacity.  IMPRESSION: Mildly lower lung volumes status post bronchoscopy, no pneumothorax or adverse features identified.   Electronically Signed   By: Lars Pinks M.D.   On: 07/24/2013 09:49      IMPRESSION: Limited stage Small Cell Lung cancer  PLAN:We discussed her disease, treatment options and prognosis. We discussed the role of radiation in the curative treatment of small cell lung cancer. We discussed the process of simulation and the placement of tattoos.  We discussed possible side effects of treatment including but not limited to fatigue, esophagitis, skin redness and damage to structures within the chest.  We discussed 6 weeks of radiation concurrent with chemotherapy.  I scheduled her for simulation next week. She has signed informed consent.  I spent 60 minutes  face to face with the patient and more than 50% of that time was spent in counseling and/or coordination of care.   ------------------------------------------------  Thea Silversmith, MD

## 2013-08-06 NOTE — Progress Notes (Signed)
[place] Clinical Social Work  Clinical Social Work met with patient/family and Futures trader at [facility] appointment to offer support and assess for psychosocial needs.  Medical oncologist reviewed patient's diagnosis and recommended treatment plan with patient/family.  Patient was accompanied by her son, [name].  She indicates her main support to be her son and mother who lives in [place].  Ms. Kinnamon stated she is scared, but ready to start treatment.  Ms. Vannostrand spouse passed away very quickly from lymphoma three years ago.    Clinical Social Work briefly discussed Clinical Social Work role and [employer] support programs/services.  Clinical Social Work encouraged patient to call with any additional questions or concerns.   Polo Riley, MSW, LCSW, OSW-C Clinical Social Worker [facility] [phone]

## 2013-08-08 ENCOUNTER — Other Ambulatory Visit: Payer: Self-pay | Admitting: *Deleted

## 2013-08-08 ENCOUNTER — Telehealth: Payer: Self-pay | Admitting: Internal Medicine

## 2013-08-08 MED ORDER — LIDOCAINE 5 % EX PTCH: 1.0000 | MEDICATED_PATCH | Freq: Two times a day (BID) | CUTANEOUS | Status: DC

## 2013-08-08 NOTE — Telephone Encounter (Signed)
Received fax requesting refill on Lidocine patches.   Refill appropriate and filled per protocol.

## 2013-08-08 NOTE — Telephone Encounter (Signed)
Spk w/pt confirming labs/ov/chemo class per 08/06 POF, pt w/b here for edu on 08/10 advised to come see me with updated sch....Cherylann Banas

## 2013-08-10 ENCOUNTER — Telehealth: Payer: Self-pay | Admitting: *Deleted

## 2013-08-10 ENCOUNTER — Other Ambulatory Visit: Payer: Self-pay

## 2013-08-10 ENCOUNTER — Ambulatory Visit (HOSPITAL_COMMUNITY): Payer: Self-pay | Admitting: Psychiatry

## 2013-08-10 NOTE — Telephone Encounter (Signed)
Per staff message and POF I have scheduled appts. Advised scheduler of appts. I had to change lab appt, scheduler notified  JMW

## 2013-08-10 NOTE — Telephone Encounter (Signed)
Pt called today in regarding the status of her pain mgmt referral, I told pt that I have been in contact with Stacy from Dr. Francesco Runner and that her referral is now in review with the provider there and that it will take up to another 2 weeks before pt can be seen. Once doctor reviews Tonya Sullivan will call and set up appt with her. Tonya Sullivan had informed me that they have been short staffed and doctors have been on Vacation.

## 2013-08-11 ENCOUNTER — Ambulatory Visit: Payer: Self-pay

## 2013-08-11 ENCOUNTER — Telehealth: Payer: Self-pay | Admitting: Family Medicine

## 2013-08-11 ENCOUNTER — Telehealth: Payer: Self-pay | Admitting: Medical Oncology

## 2013-08-11 ENCOUNTER — Other Ambulatory Visit: Payer: Self-pay

## 2013-08-11 ENCOUNTER — Encounter: Payer: Self-pay | Admitting: *Deleted

## 2013-08-11 ENCOUNTER — Ambulatory Visit: Payer: Medicare Other

## 2013-08-11 ENCOUNTER — Telehealth: Payer: Self-pay | Admitting: Internal Medicine

## 2013-08-11 MED ORDER — FENTANYL 25 MCG/HR TD PT72: 25.0000 ug | MEDICATED_PATCH | TRANSDERMAL | Status: DC

## 2013-08-11 NOTE — Telephone Encounter (Signed)
Call returned to [facility].   Reports that she was in contact with pain management office that patient was referred to. States that office refused to speak with her in regards to patient, was incredibly rude to [place], and reported that they would only speak with patient. (Received fax from Dr. Isabelle Course on 08/10/2013 that referral was denied).   Reports that patient is in extreme pain and with new dx of lung cancer she is not sure that patient should be going to pain management clinic.   States that patient reports that if she cannot get something soon, she will go to ER.   Requested MD to consider Duragesic 13mcg TD patch Q 3 days.   MD please advise.

## 2013-08-11 NOTE — Telephone Encounter (Signed)
Arbie Cookey aware and will contact patient in regards to picking up prescription.

## 2013-08-11 NOTE — Telephone Encounter (Signed)
Tonya Sullivan from thn left message for [place] to call her back regarding Tonya Sullivan. I returned the call, which she did not like it that I called her back because I was not the doctor, basically I was a waste of her time, she was VERY rude to me. She would like only nurse or doctor to call her back. [phone]

## 2013-08-11 NOTE — Telephone Encounter (Signed)
No answer on pts phone. I contacted her mother who stated pt did not have transportation to come in for chemotherapy. She does not know about tomorrow.

## 2013-08-11 NOTE — Telephone Encounter (Signed)
I will put her on the fentayl patch, but no oral medications at this time, I have multiple concerns and I have discussed this with Tonya Sullivan, she can come to the visit with Tonya Sullivan if needed on Thursday, there have been too many complications from her pain medications and we have tried 3 pain clinics already

## 2013-08-11 NOTE — Telephone Encounter (Signed)
Pt cld states she didn't have transportation, r/s chemo edu and sent to [facility] to start chemo to day 1...Marland KitchenMarland KitchenKJ

## 2013-08-12 ENCOUNTER — Ambulatory Visit
Admission: RE | Admit: 2013-08-12 | Discharge: 2013-08-12 | Disposition: A | Discharge status: Home / Self Care | Payer: Medicare Other | Source: Ambulatory Visit | Attending: Radiation Oncology | Admitting: Radiation Oncology

## 2013-08-12 ENCOUNTER — Ambulatory Visit: Payer: Self-pay

## 2013-08-12 ENCOUNTER — Telehealth: Payer: Self-pay | Admitting: *Deleted

## 2013-08-12 DIAGNOSIS — Z510 Encounter for antineoplastic radiation therapy: Secondary | ICD-10-CM | POA: Insufficient documentation

## 2013-08-12 DIAGNOSIS — C3490 Malignant neoplasm of unspecified part of unspecified bronchus or lung: Secondary | ICD-10-CM | POA: Insufficient documentation

## 2013-08-12 DIAGNOSIS — C3491 Malignant neoplasm of unspecified part of right bronchus or lung: Secondary | ICD-10-CM

## 2013-08-12 NOTE — Telephone Encounter (Signed)
Message left with Polo Riley regarding patient's transportation. Chemo rescheduled per note below.

## 2013-08-12 NOTE — Progress Notes (Signed)
Patient reports she has chronic back pain, history of 5 back surgeries. She states she has productive cough with yellow sputum, SOB w/exertion. She does not use oxygen.

## 2013-08-12 NOTE — Telephone Encounter (Signed)
Per staff message fro scheduler I have rescheduled appts

## 2013-08-13 ENCOUNTER — Encounter: Payer: Self-pay | Admitting: Family Medicine

## 2013-08-13 ENCOUNTER — Ambulatory Visit (INDEPENDENT_AMBULATORY_CARE_PROVIDER_SITE_OTHER): Payer: Medicare Other | Admitting: Family Medicine

## 2013-08-13 ENCOUNTER — Ambulatory Visit: Payer: Self-pay

## 2013-08-13 ENCOUNTER — Telehealth: Payer: Self-pay | Admitting: *Deleted

## 2013-08-13 VITALS — BP 138/78 | HR 64 | Temp 98.2°F | Resp 14 | Ht 66.0 in | Wt 192.0 lb

## 2013-08-13 DIAGNOSIS — C3491 Malignant neoplasm of unspecified part of right bronchus or lung: Secondary | ICD-10-CM

## 2013-08-13 DIAGNOSIS — I6529 Occlusion and stenosis of unspecified carotid artery: Secondary | ICD-10-CM

## 2013-08-13 DIAGNOSIS — C3490 Malignant neoplasm of unspecified part of unspecified bronchus or lung: Secondary | ICD-10-CM

## 2013-08-13 DIAGNOSIS — J418 Mixed simple and mucopurulent chronic bronchitis: Secondary | ICD-10-CM

## 2013-08-13 DIAGNOSIS — Z510 Encounter for antineoplastic radiation therapy: Secondary | ICD-10-CM | POA: Diagnosis not present

## 2013-08-13 DIAGNOSIS — J411 Mucopurulent chronic bronchitis: Secondary | ICD-10-CM

## 2013-08-13 DIAGNOSIS — G8929 Other chronic pain: Secondary | ICD-10-CM

## 2013-08-13 NOTE — Assessment & Plan Note (Signed)
Currently stable, continue inhalers

## 2013-08-13 NOTE — Progress Notes (Signed)
Patient ID: Tonya Sullivan, female   DOB: Oct 31, 1955, 58 y.o.   MRN: [id number]   Subjective:    Patient ID: Tonya Sullivan, female    DOB: 09-16-1955, 58 y.o.   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 Denies any change in breathing, no SOB, still has cough but no hemoptysis. She is here along, states her son and family is not helping as much as they said and her son has missed her last 2 appointments. She is confused about when and where she is suppose to be and hands me a long calender with there oncology treatments.  Chronic pain- I agreed to start fentaly patches, this is being dispensed by her [facility].   Her recent labs were reviewed    Review Of Systems:  GEN- denies fatigue, fever, weight loss,weakness, recent illness HEENT- denies eye drainage, change in vision, nasal discharge, CVS- denies chest pain, palpitations RESP- denies SOB, +cough, wheeze ABD- denies N/V, change in stools, abd pain GU- denies dysuria, hematuria, dribbling, incontinence MSK- +joint pain, muscle aches, injury Neuro- denies headache, dizziness, syncope, seizure activity       Objective:    BP 138/78  Pulse 64  Temp(Src) 98.2 F (36.8 C) (Oral)  Resp 14  Ht 5\' 6"  (1.676 m)  Wt 192 lb (87.091 kg)  BMI 31.00 kg/m2 GEN- NAD, alert and oriented x3 HEENT- PERRL, EOMI, non injected sclera, pink conjunctiva, MMM, oropharynx clear CVS- RRR, no murmur RESP-CTAB, no wheeze, decreased left base Psych-  Depressed affect, well groomed, normal speech EXT- No edema Pulses- Radial 2+       Assessment & Plan:      Problem List Items Addressed This Visit   None      Note: This dictation was prepared with Dragon dictation along with smaller phrase technology. Any transcriptional errors that result from this process are unintentional.

## 2013-08-13 NOTE — Telephone Encounter (Signed)
Received call from patient mother, Thayer Headings.  Reports that patient states that someone will need to go with her to scheduled appointments. States that she is 58 years old and can't make it to appointments, and her son is working and cannot take the time off.   Advised MD of mother's concern.

## 2013-08-13 NOTE — Assessment & Plan Note (Signed)
Unfortunate diagnosis with poor family support [facility] nurse is doing a great job keeping her on track with appointments and SW with transportation Will follow along with oncology At this time I do not see any reason to stop any of her chronic medications I discussed this with pt and she wants to continue, as we see fit or oncology medications can be discontinued or adjusted pending her case

## 2013-08-13 NOTE — Progress Notes (Signed)
[facility] Simulation and Treatment Planning Note   Name: Tonya Sullivan MRN: [id number]  Date: 08/13/2013  DOB: August 19, 1955  Status: outpatient    DIAGNOSIS: Limited stage small cell lung cancer    SIDE: left   CONSENT VERIFIED: yes   SET UP AND IMMOBILIZATION: Patient is setup supine with arms in a wing board.   NARRATIVE: The patient was brought to the [facility].  Identity was confirmed.  All relevant records and images related to the planned course of therapy were reviewed.  Then, the patient was positioned in a stable reproducible clinical set-up for radiation therapy.  CT images were obtained.  Skin markings were placed.  The CT images were loaded into the planning software where the target and avoidance structures were contoured.  The radiation prescription was entered and confirmed.   TREATMENT PLANNING NOTE:  Treatment planning then occurred. I have requested 3D simulation with [facility] of the spinal cord, total lungs and gross tumor volume. I have also requested mlcs and an isodose plan.   Special treatment procedure will be performed as [name] will be receiving concurrent chemotherapy.   I have ordered a consult with the dietician for monitoring.  I will also be verifying that weekly lab values are appropriate.   A total of 4 complex treatment devices will be used in the form on MLCs for beam modification purposes to protect critical normal structures.   Daily CBCT will be used for verification of positioning.

## 2013-08-13 NOTE — Patient Instructions (Signed)
Appt Friday MORNING at 9:30 for chemotherapy education Continue current medications for now  I will discuss with Dr. Dorris Fetch  F/U 2 months

## 2013-08-13 NOTE — Assessment & Plan Note (Signed)
Continue on fentanyl patches, I will see how she does with this, see my notes, I do not feel comfortable giving her oral medications with her history

## 2013-08-14 ENCOUNTER — Ambulatory Visit: Payer: Medicare Other | Admitting: Family Medicine

## 2013-08-14 ENCOUNTER — Telehealth (HOSPITAL_COMMUNITY): Payer: Self-pay | Admitting: *Deleted

## 2013-08-14 ENCOUNTER — Ambulatory Visit: Payer: Self-pay

## 2013-08-14 ENCOUNTER — Other Ambulatory Visit: Payer: Medicare Other

## 2013-08-14 NOTE — Telephone Encounter (Signed)
My script should be the only one. She may have enough until appt.

## 2013-08-14 NOTE — Telephone Encounter (Signed)
Informed pharmacy about Dr. Harrington Challenger decision about pt med and Pharmacy agreed. Called pt to see how many tablets she still have left of her Clonazepam and per Pt she still have 29 tablets left. I asked pt if Dr. Harrington Challenger name was on the bottle and she stated yes. I informed her that her pharmacy was requesting refills on her Clonazepam and she just stated oh ok.

## 2013-08-14 NOTE — Telephone Encounter (Signed)
Patient pharmacy is requesting pt Clonazepam 1 mg QID. Pt was last seen 06-26-13 and have an upcoming appointment 08-26-13. Pt medication was last refilled 05/29/2013 with 2 refills. Called pt pharmacy and they stated that there's Rx of file from Dr. Marlene Lard and Dr. Hedy Jacob back in April and May that still have valid refills left with the same SIG. The pharmacy would like to know if you want them to D/C those two Doctors refills and make your refill the only one. Plus should we give pt Clonazepam until her next appointment?

## 2013-08-17 ENCOUNTER — Telehealth: Payer: Self-pay | Admitting: *Deleted

## 2013-08-17 ENCOUNTER — Encounter (HOSPITAL_COMMUNITY): Payer: Self-pay | Admitting: Psychiatry

## 2013-08-17 ENCOUNTER — Other Ambulatory Visit: Payer: Self-pay | Admitting: Family Medicine

## 2013-08-17 ENCOUNTER — Ambulatory Visit (HOSPITAL_COMMUNITY): Payer: Self-pay | Admitting: Psychiatry

## 2013-08-17 NOTE — Telephone Encounter (Signed)
Refill appropriate and filled per protocol. 

## 2013-08-17 NOTE — Telephone Encounter (Signed)
Per pharmacy I have scheduled appt

## 2013-08-18 ENCOUNTER — Other Ambulatory Visit: Payer: Self-pay | Admitting: *Deleted

## 2013-08-18 ENCOUNTER — Ambulatory Visit (HOSPITAL_BASED_OUTPATIENT_CLINIC_OR_DEPARTMENT_OTHER): Payer: Medicare Other

## 2013-08-18 VITALS — BP 108/59 | HR 80 | Temp 98.0°F

## 2013-08-18 DIAGNOSIS — C3490 Malignant neoplasm of unspecified part of unspecified bronchus or lung: Secondary | ICD-10-CM

## 2013-08-18 DIAGNOSIS — C3410 Malignant neoplasm of upper lobe, unspecified bronchus or lung: Secondary | ICD-10-CM

## 2013-08-18 DIAGNOSIS — C3491 Malignant neoplasm of unspecified part of right bronchus or lung: Secondary | ICD-10-CM

## 2013-08-18 DIAGNOSIS — Z5111 Encounter for antineoplastic chemotherapy: Secondary | ICD-10-CM

## 2013-08-18 MED ORDER — SODIUM CHLORIDE 0.9 % IV SOLN
120.0000 mg/m2 | Freq: Once | INTRAVENOUS | Status: AC
  Administered 2013-08-18: 250 mg via INTRAVENOUS
  Filled 2013-08-18: qty 12.5

## 2013-08-18 MED ORDER — LIDOCAINE-PRILOCAINE 2.5-2.5 % EX CREA: 1.0000 "application " | TOPICAL_CREAM | CUTANEOUS | Status: DC | PRN

## 2013-08-18 MED ORDER — POTASSIUM CHLORIDE 2 MEQ/ML IV SOLN
Freq: Once | INTRAVENOUS | Status: AC
  Administered 2013-08-18: 11:00:00 via INTRAVENOUS
  Filled 2013-08-18: qty 10

## 2013-08-18 MED ORDER — SODIUM CHLORIDE 0.9 % IV SOLN
150.0000 mg | Freq: Once | INTRAVENOUS | Status: AC
  Administered 2013-08-18: 150 mg via INTRAVENOUS
  Filled 2013-08-18: qty 5

## 2013-08-18 MED ORDER — DEXAMETHASONE SODIUM PHOSPHATE 20 MG/5ML IJ SOLN
12.0000 mg | Freq: Once | INTRAMUSCULAR | Status: AC
  Administered 2013-08-18: 12 mg via INTRAVENOUS

## 2013-08-18 MED ORDER — SODIUM CHLORIDE 0.9 % IV SOLN
Freq: Once | INTRAVENOUS | Status: AC
  Administered 2013-08-18: 10:00:00 via INTRAVENOUS

## 2013-08-18 MED ORDER — SODIUM CHLORIDE 0.9 % IV SOLN
60.0000 mg/m2 | Freq: Once | INTRAVENOUS | Status: AC
  Administered 2013-08-18: 123 mg via INTRAVENOUS
  Filled 2013-08-18: qty 123

## 2013-08-18 MED ORDER — DEXAMETHASONE SODIUM PHOSPHATE 20 MG/5ML IJ SOLN
INTRAMUSCULAR | Status: AC
  Filled 2013-08-18: qty 5

## 2013-08-18 MED ORDER — PALONOSETRON HCL INJECTION 0.25 MG/5ML
0.2500 mg | Freq: Once | INTRAVENOUS | Status: AC
  Administered 2013-08-18: 0.25 mg via INTRAVENOUS

## 2013-08-18 MED ORDER — PALONOSETRON HCL INJECTION 0.25 MG/5ML
INTRAVENOUS | Status: AC
  Filled 2013-08-18: qty 5

## 2013-08-18 NOTE — Patient Instructions (Signed)
[facility] Discharge Instructions for Patients Receiving Chemotherapy  Today you received the following chemotherapy agents: Cisplatin and etoposide.  To help prevent nausea and vomiting after your treatment, we encourage you to take your nausea medication: Compazine 10mg  every 6 hours as needed.   If you develop nausea and vomiting that is not controlled by your nausea medication, call the clinic.   BELOW ARE SYMPTOMS THAT SHOULD BE REPORTED IMMEDIATELY:  *FEVER GREATER THAN 100.5 F  *CHILLS WITH OR WITHOUT FEVER  NAUSEA AND VOMITING THAT IS NOT CONTROLLED WITH YOUR NAUSEA MEDICATION  *UNUSUAL SHORTNESS OF BREATH  *UNUSUAL BRUISING OR BLEEDING  TENDERNESS IN MOUTH AND THROAT WITH OR WITHOUT PRESENCE OF ULCERS  *URINARY PROBLEMS  *BOWEL PROBLEMS  UNUSUAL RASH Items with * indicate a potential emergency and should be followed up as soon as possible.  Feel free to call the clinic you have any questions or concerns. The clinic phone number is [phone].

## 2013-08-18 NOTE — Progress Notes (Signed)
Pt is requesting a port.  Per Dr Vista Mink, okay for pt to have port placed.  Order placed with IR for port to be placed a few days before next cycle of chemo which starts 09/08/13.  SLJ

## 2013-08-18 NOTE — Progress Notes (Signed)
@   5:25 pm noted pt to have change in respiratory status as evidenced by generalized, diffuse audible wheezing  And auscultated in all lung fields.  VSS BP 108/56; pulse 80, temp 98, resp. Rate 20 and S02 94-95%  Patient states she feels a bit short of breath.  Has voided >1000 cc.  Discussed with Dr. Julien Nordmann. Received order to stop IVFs (has received 800cc). Also instructed pt. to use inhaler as prescribed.  Pt. did this easily.  IVF's stopped.    @ 5:45 pm pt. Discharged home with driver in [place].  To return to [facility] tomorrow. Verbalizes understanding of discharge instructions

## 2013-08-19 ENCOUNTER — Ambulatory Visit (HOSPITAL_BASED_OUTPATIENT_CLINIC_OR_DEPARTMENT_OTHER): Payer: Medicare Other

## 2013-08-19 ENCOUNTER — Ambulatory Visit
Admission: RE | Admit: 2013-08-19 | Discharge: 2013-08-19 | Disposition: A | Discharge status: Home / Self Care | Payer: Medicare Other | Source: Ambulatory Visit | Attending: Radiation Oncology | Admitting: Radiation Oncology

## 2013-08-19 VITALS — BP 107/72 | HR 72 | Temp 98.5°F | Resp 18

## 2013-08-19 DIAGNOSIS — C3491 Malignant neoplasm of unspecified part of right bronchus or lung: Secondary | ICD-10-CM

## 2013-08-19 DIAGNOSIS — C3410 Malignant neoplasm of upper lobe, unspecified bronchus or lung: Secondary | ICD-10-CM

## 2013-08-19 DIAGNOSIS — Z5111 Encounter for antineoplastic chemotherapy: Secondary | ICD-10-CM

## 2013-08-19 MED ORDER — SODIUM CHLORIDE 0.9 % IV SOLN
120.0000 mg/m2 | Freq: Once | INTRAVENOUS | Status: AC
  Administered 2013-08-19: 250 mg via INTRAVENOUS
  Filled 2013-08-19: qty 12.5

## 2013-08-19 MED ORDER — DEXAMETHASONE SODIUM PHOSPHATE 10 MG/ML IJ SOLN
INTRAMUSCULAR | Status: AC
  Filled 2013-08-19: qty 1

## 2013-08-19 MED ORDER — DEXAMETHASONE SODIUM PHOSPHATE 10 MG/ML IJ SOLN
10.0000 mg | Freq: Once | INTRAMUSCULAR | Status: AC
  Administered 2013-08-19: 10 mg via INTRAVENOUS

## 2013-08-19 MED ORDER — SODIUM CHLORIDE 0.9 % IV SOLN
Freq: Once | INTRAVENOUS | Status: AC
  Administered 2013-08-19: 12:00:00 via INTRAVENOUS

## 2013-08-19 NOTE — Progress Notes (Signed)
Pt requesting to speak with a Education officer, museum. Voicemail left with Abigail.  1205: Pt reporting that she woke up this morning and her right eye was swollen. Upon assessment, area under right eye looks slightly swollen, but not causing irritation or vision problems. Denies any new medications except for starting chemo yesterday. Dr. Julien Nordmann notified. Pt informed to monitor swelling and if it gets worse to call or go to an eye doctor.

## 2013-08-19 NOTE — Patient Instructions (Signed)
[facility] Discharge Instructions for Patients Receiving Chemotherapy  Today you received the following chemotherapy agents: Etoposide.  To help prevent nausea and vomiting after your treatment, we encourage you to take your nausea medication as prescribed.   If you develop nausea and vomiting that is not controlled by your nausea medication, call the clinic.   BELOW ARE SYMPTOMS THAT SHOULD BE REPORTED IMMEDIATELY:  *FEVER GREATER THAN 100.5 F  *CHILLS WITH OR WITHOUT FEVER  NAUSEA AND VOMITING THAT IS NOT CONTROLLED WITH YOUR NAUSEA MEDICATION  *UNUSUAL SHORTNESS OF BREATH  *UNUSUAL BRUISING OR BLEEDING  TENDERNESS IN MOUTH AND THROAT WITH OR WITHOUT PRESENCE OF ULCERS  *URINARY PROBLEMS  *BOWEL PROBLEMS  UNUSUAL RASH Items with * indicate a potential emergency and should be followed up as soon as possible.  Feel free to call the clinic you have any questions or concerns. The clinic phone number is [phone].

## 2013-08-19 NOTE — Progress Notes (Signed)
Patient brought to nursing from linac #2 to have skin assessment after having coffee dropped into lap prior to having port film which has to be delayed until after infusion appointment.No visible skin changes.Given radiaplex to apply.Seen by Dr.Moody and will assess skin again on tomorrow.Patient knows that we will fit her in after 1:00 pm.

## 2013-08-20 ENCOUNTER — Observation Stay (HOSPITAL_COMMUNITY): Payer: Medicare Other

## 2013-08-20 ENCOUNTER — Ambulatory Visit: Payer: Self-pay

## 2013-08-20 ENCOUNTER — Other Ambulatory Visit (HOSPITAL_COMMUNITY): Payer: Self-pay | Admitting: Oncology

## 2013-08-20 ENCOUNTER — Inpatient Hospital Stay (HOSPITAL_COMMUNITY): Payer: Medicare Other

## 2013-08-20 ENCOUNTER — Encounter (HOSPITAL_COMMUNITY): Payer: Self-pay | Admitting: Emergency Medicine

## 2013-08-20 ENCOUNTER — Ambulatory Visit: Payer: Medicare Other

## 2013-08-20 ENCOUNTER — Telehealth: Payer: Self-pay | Admitting: Medical Oncology

## 2013-08-20 ENCOUNTER — Inpatient Hospital Stay (HOSPITAL_COMMUNITY)
Admission: EM | Admit: 2013-08-20 | Discharge: 2013-08-21 | DRG: 607 | Disposition: A | Discharge status: Home Health | Payer: Medicare Other | Attending: Internal Medicine | Admitting: Internal Medicine

## 2013-08-20 ENCOUNTER — Emergency Department (HOSPITAL_COMMUNITY): Payer: Medicare Other

## 2013-08-20 DIAGNOSIS — F319 Bipolar disorder, unspecified: Secondary | ICD-10-CM | POA: Diagnosis present

## 2013-08-20 DIAGNOSIS — R799 Abnormal finding of blood chemistry, unspecified: Secondary | ICD-10-CM | POA: Diagnosis present

## 2013-08-20 DIAGNOSIS — Z85820 Personal history of malignant melanoma of skin: Secondary | ICD-10-CM | POA: Diagnosis not present

## 2013-08-20 DIAGNOSIS — F329 Major depressive disorder, single episode, unspecified: Secondary | ICD-10-CM | POA: Diagnosis present

## 2013-08-20 DIAGNOSIS — R269 Unspecified abnormalities of gait and mobility: Secondary | ICD-10-CM

## 2013-08-20 DIAGNOSIS — Z881 Allergy status to other antibiotic agents status: Secondary | ICD-10-CM | POA: Diagnosis not present

## 2013-08-20 DIAGNOSIS — Z923 Personal history of irradiation: Secondary | ICD-10-CM | POA: Diagnosis not present

## 2013-08-20 DIAGNOSIS — I10 Essential (primary) hypertension: Secondary | ICD-10-CM

## 2013-08-20 DIAGNOSIS — Z8249 Family history of ischemic heart disease and other diseases of the circulatory system: Secondary | ICD-10-CM | POA: Diagnosis not present

## 2013-08-20 DIAGNOSIS — R233 Spontaneous ecchymoses: Secondary | ICD-10-CM

## 2013-08-20 DIAGNOSIS — T451X5A Adverse effect of antineoplastic and immunosuppressive drugs, initial encounter: Secondary | ICD-10-CM | POA: Diagnosis present

## 2013-08-20 DIAGNOSIS — IMO0002 Reserved for concepts with insufficient information to code with codable children: Secondary | ICD-10-CM | POA: Diagnosis not present

## 2013-08-20 DIAGNOSIS — T7840XA Allergy, unspecified, initial encounter: Secondary | ICD-10-CM

## 2013-08-20 DIAGNOSIS — T380X5A Adverse effect of glucocorticoids and synthetic analogues, initial encounter: Secondary | ICD-10-CM | POA: Diagnosis present

## 2013-08-20 DIAGNOSIS — C3490 Malignant neoplasm of unspecified part of unspecified bronchus or lung: Secondary | ICD-10-CM | POA: Diagnosis present

## 2013-08-20 DIAGNOSIS — R7989 Other specified abnormal findings of blood chemistry: Secondary | ICD-10-CM

## 2013-08-20 DIAGNOSIS — J449 Chronic obstructive pulmonary disease, unspecified: Secondary | ICD-10-CM | POA: Diagnosis present

## 2013-08-20 DIAGNOSIS — Z801 Family history of malignant neoplasm of trachea, bronchus and lung: Secondary | ICD-10-CM | POA: Diagnosis not present

## 2013-08-20 DIAGNOSIS — H05229 Edema of unspecified orbit: Secondary | ICD-10-CM | POA: Diagnosis present

## 2013-08-20 DIAGNOSIS — R221 Localized swelling, mass and lump, neck: Principal | ICD-10-CM

## 2013-08-20 DIAGNOSIS — G8929 Other chronic pain: Secondary | ICD-10-CM | POA: Diagnosis present

## 2013-08-20 DIAGNOSIS — K219 Gastro-esophageal reflux disease without esophagitis: Secondary | ICD-10-CM

## 2013-08-20 DIAGNOSIS — G894 Chronic pain syndrome: Secondary | ICD-10-CM | POA: Diagnosis present

## 2013-08-20 DIAGNOSIS — Z9089 Acquired absence of other organs: Secondary | ICD-10-CM

## 2013-08-20 DIAGNOSIS — J441 Chronic obstructive pulmonary disease with (acute) exacerbation: Secondary | ICD-10-CM

## 2013-08-20 DIAGNOSIS — E2749 Other adrenocortical insufficiency: Secondary | ICD-10-CM

## 2013-08-20 DIAGNOSIS — Z888 Allergy status to other drugs, medicaments and biological substances status: Secondary | ICD-10-CM

## 2013-08-20 DIAGNOSIS — R778 Other specified abnormalities of plasma proteins: Secondary | ICD-10-CM | POA: Diagnosis present

## 2013-08-20 DIAGNOSIS — Z9221 Personal history of antineoplastic chemotherapy: Secondary | ICD-10-CM

## 2013-08-20 DIAGNOSIS — Z882 Allergy status to sulfonamides status: Secondary | ICD-10-CM | POA: Diagnosis not present

## 2013-08-20 DIAGNOSIS — E785 Hyperlipidemia, unspecified: Secondary | ICD-10-CM | POA: Diagnosis present

## 2013-08-20 DIAGNOSIS — R918 Other nonspecific abnormal finding of lung field: Secondary | ICD-10-CM

## 2013-08-20 DIAGNOSIS — R471 Dysarthria and anarthria: Secondary | ICD-10-CM

## 2013-08-20 DIAGNOSIS — R1310 Dysphagia, unspecified: Secondary | ICD-10-CM

## 2013-08-20 DIAGNOSIS — D649 Anemia, unspecified: Secondary | ICD-10-CM | POA: Diagnosis present

## 2013-08-20 DIAGNOSIS — E039 Hypothyroidism, unspecified: Secondary | ICD-10-CM | POA: Diagnosis present

## 2013-08-20 DIAGNOSIS — F32A Depression, unspecified: Secondary | ICD-10-CM

## 2013-08-20 DIAGNOSIS — Z818 Family history of other mental and behavioral disorders: Secondary | ICD-10-CM | POA: Diagnosis not present

## 2013-08-20 DIAGNOSIS — F39 Unspecified mood [affective] disorder: Secondary | ICD-10-CM

## 2013-08-20 DIAGNOSIS — R600 Localized edema: Secondary | ICD-10-CM

## 2013-08-20 DIAGNOSIS — Z720 Tobacco use: Secondary | ICD-10-CM

## 2013-08-20 DIAGNOSIS — Z885 Allergy status to narcotic agent status: Secondary | ICD-10-CM | POA: Diagnosis not present

## 2013-08-20 DIAGNOSIS — M47812 Spondylosis without myelopathy or radiculopathy, cervical region: Secondary | ICD-10-CM

## 2013-08-20 DIAGNOSIS — Z9181 History of falling: Secondary | ICD-10-CM

## 2013-08-20 DIAGNOSIS — D638 Anemia in other chronic diseases classified elsewhere: Secondary | ICD-10-CM | POA: Diagnosis present

## 2013-08-20 DIAGNOSIS — K222 Esophageal obstruction: Secondary | ICD-10-CM

## 2013-08-20 DIAGNOSIS — F17200 Nicotine dependence, unspecified, uncomplicated: Secondary | ICD-10-CM | POA: Diagnosis present

## 2013-08-20 DIAGNOSIS — E2740 Unspecified adrenocortical insufficiency: Secondary | ICD-10-CM | POA: Diagnosis present

## 2013-08-20 DIAGNOSIS — R609 Edema, unspecified: Secondary | ICD-10-CM

## 2013-08-20 DIAGNOSIS — R220 Localized swelling, mass and lump, head: Principal | ICD-10-CM

## 2013-08-20 DIAGNOSIS — F332 Major depressive disorder, recurrent severe without psychotic features: Secondary | ICD-10-CM

## 2013-08-20 DIAGNOSIS — E119 Type 2 diabetes mellitus without complications: Secondary | ICD-10-CM | POA: Diagnosis present

## 2013-08-20 DIAGNOSIS — Z8673 Personal history of transient ischemic attack (TIA), and cerebral infarction without residual deficits: Secondary | ICD-10-CM

## 2013-08-20 DIAGNOSIS — R413 Other amnesia: Secondary | ICD-10-CM

## 2013-08-20 DIAGNOSIS — M549 Dorsalgia, unspecified: Secondary | ICD-10-CM

## 2013-08-20 DIAGNOSIS — Z8701 Personal history of pneumonia (recurrent): Secondary | ICD-10-CM

## 2013-08-20 DIAGNOSIS — F4310 Post-traumatic stress disorder, unspecified: Secondary | ICD-10-CM | POA: Diagnosis present

## 2013-08-20 DIAGNOSIS — E063 Autoimmune thyroiditis: Secondary | ICD-10-CM | POA: Diagnosis present

## 2013-08-20 DIAGNOSIS — Z6379 Other stressful life events affecting family and household: Secondary | ICD-10-CM | POA: Diagnosis not present

## 2013-08-20 DIAGNOSIS — R490 Dysphonia: Secondary | ICD-10-CM

## 2013-08-20 HISTORY — DX: Essential (primary) hypertension: I10

## 2013-08-20 HISTORY — DX: Malignant neoplasm of unspecified part of unspecified bronchus or lung: C34.90

## 2013-08-20 HISTORY — DX: Hypothyroidism, unspecified: E03.9

## 2013-08-20 HISTORY — DX: Personal history of pneumonia (recurrent): Z87.01

## 2013-08-20 LAB — CBC WITH DIFFERENTIAL/PLATELET
Basophils Absolute: 0 10*3/uL (ref 0.0–0.1)
Basophils Relative: 0 % (ref 0–1)
EOS ABS: 0 10*3/uL (ref 0.0–0.7)
Eosinophils Relative: 0 % (ref 0–5)
HCT: 34.2 % — ABNORMAL LOW (ref 36.0–46.0)
HEMOGLOBIN: 11.2 g/dL — AB (ref 12.0–15.0)
Lymphocytes Relative: 14 % (ref 12–46)
Lymphs Abs: 1.2 10*3/uL (ref 0.7–4.0)
MCH: 29.6 pg (ref 26.0–34.0)
MCHC: 32.7 g/dL (ref 30.0–36.0)
MCV: 90.2 fL (ref 78.0–100.0)
MONO ABS: 0.6 10*3/uL (ref 0.1–1.0)
Monocytes Relative: 7 % (ref 3–12)
NEUTROS PCT: 79 % — AB (ref 43–77)
Neutro Abs: 6.8 10*3/uL (ref 1.7–7.7)
Platelets: 230 10*3/uL (ref 150–400)
RBC: 3.79 MIL/uL — ABNORMAL LOW (ref 3.87–5.11)
RDW: 14.4 % (ref 11.5–15.5)
WBC: 8.6 10*3/uL (ref 4.0–10.5)

## 2013-08-20 LAB — TSH: TSH: 0.107 u[IU]/mL — ABNORMAL LOW (ref 0.350–4.500)

## 2013-08-20 LAB — COMPREHENSIVE METABOLIC PANEL
ALBUMIN: 2.8 g/dL — AB (ref 3.5–5.2)
ALT: 9 U/L (ref 0–35)
ANION GAP: 9 (ref 5–15)
AST: 13 U/L (ref 0–37)
Alkaline Phosphatase: 48 U/L (ref 39–117)
BUN: 16 mg/dL (ref 6–23)
CALCIUM: 8.4 mg/dL (ref 8.4–10.5)
CO2: 29 mEq/L (ref 19–32)
CREATININE: 0.88 mg/dL (ref 0.50–1.10)
Chloride: 100 mEq/L (ref 96–112)
GFR calc Af Amer: 82 mL/min — ABNORMAL LOW (ref >90)
GFR calc non Af Amer: 71 mL/min — ABNORMAL LOW (ref >90)
Glucose, Bld: 101 mg/dL — ABNORMAL HIGH (ref 70–99)
POTASSIUM: 4.8 meq/L (ref 3.7–5.3)
Sodium: 138 mEq/L (ref 137–147)
Total Bilirubin: <0.2 mg/dL — ABNORMAL LOW (ref 0.3–1.2)
Total Protein: 5.9 g/dL — ABNORMAL LOW (ref 6.0–8.3)

## 2013-08-20 LAB — TROPONIN I
TROPONIN I: 0.46 ng/mL — AB (ref <0.30)
Troponin I: 0.47 ng/mL (ref <0.30)
Troponin I: 0.48 ng/mL (ref <0.30)
Troponin I: 0.47 ng/mL (ref <0.30)

## 2013-08-20 LAB — HEMOGLOBIN A1C
Hgb A1c MFr Bld: 5.9 % — ABNORMAL HIGH (ref <5.7)
Mean Plasma Glucose: 123 mg/dL — ABNORMAL HIGH (ref <117)

## 2013-08-20 LAB — MRSA PCR SCREENING: MRSA by PCR: NEGATIVE

## 2013-08-20 LAB — PRO B NATRIURETIC PEPTIDE: PRO B NATRI PEPTIDE: 1866 pg/mL — AB (ref 0–125)

## 2013-08-20 MED ORDER — PROCHLORPERAZINE MALEATE 5 MG PO TABS: 10.0000 mg | ORAL_TABLET | Freq: Four times a day (QID) | ORAL | Status: DC | PRN

## 2013-08-20 MED ORDER — ALBUTEROL SULFATE (2.5 MG/3ML) 0.083% IN NEBU
2.5000 mg | INHALATION_SOLUTION | RESPIRATORY_TRACT | Status: DC
  Administered 2013-08-20 – 2013-08-21 (×7): 2.5 mg via RESPIRATORY_TRACT
  Filled 2013-08-20 (×7): qty 3

## 2013-08-20 MED ORDER — IOHEXOL 350 MG/ML SOLN
100.0000 mL | Freq: Once | INTRAVENOUS | Status: AC | PRN
  Administered 2013-08-20: 100 mL via INTRAVENOUS

## 2013-08-20 MED ORDER — CLONAZEPAM 0.5 MG PO TABS
1.0000 mg | ORAL_TABLET | Freq: Four times a day (QID) | ORAL | Status: DC
  Administered 2013-08-20 – 2013-08-21 (×5): 1 mg via ORAL
  Filled 2013-08-20 (×8): qty 2

## 2013-08-20 MED ORDER — ALBUTEROL SULFATE (2.5 MG/3ML) 0.083% IN NEBU
2.5000 mg | INHALATION_SOLUTION | RESPIRATORY_TRACT | Status: DC | PRN
  Administered 2013-08-20: 2.5 mg via RESPIRATORY_TRACT
  Filled 2013-08-20: qty 3

## 2013-08-20 MED ORDER — ALUM & MAG HYDROXIDE-SIMETH 200-200-20 MG/5ML PO SUSP: 30.0000 mL | Freq: Four times a day (QID) | ORAL | Status: DC | PRN

## 2013-08-20 MED ORDER — LEVOTHYROXINE SODIUM 25 MCG PO TABS
25.0000 ug | ORAL_TABLET | Freq: Every day | ORAL | Status: DC
  Administered 2013-08-21: 25 ug via ORAL
  Filled 2013-08-20: qty 1

## 2013-08-20 MED ORDER — IPRATROPIUM-ALBUTEROL 0.5-2.5 (3) MG/3ML IN SOLN: 3.0000 mL | RESPIRATORY_TRACT | Status: DC

## 2013-08-20 MED ORDER — ACETAMINOPHEN 650 MG RE SUPP: 650.0000 mg | Freq: Four times a day (QID) | RECTAL | Status: DC | PRN

## 2013-08-20 MED ORDER — DIVALPROEX SODIUM ER 500 MG PO TB24
500.0000 mg | ORAL_TABLET | Freq: Every day | ORAL | Status: DC
  Administered 2013-08-21: 500 mg via ORAL
  Filled 2013-08-20 (×2): qty 1

## 2013-08-20 MED ORDER — ONDANSETRON HCL 4 MG PO TABS: 4.0000 mg | ORAL_TABLET | Freq: Four times a day (QID) | ORAL | Status: DC | PRN

## 2013-08-20 MED ORDER — DIPHENHYDRAMINE HCL 50 MG/ML IJ SOLN
25.0000 mg | Freq: Once | INTRAMUSCULAR | Status: AC
  Administered 2013-08-20: 25 mg via INTRAVENOUS
  Filled 2013-08-20: qty 1

## 2013-08-20 MED ORDER — PANTOPRAZOLE SODIUM 40 MG PO TBEC
40.0000 mg | DELAYED_RELEASE_TABLET | Freq: Every day | ORAL | Status: DC
  Administered 2013-08-21: 40 mg via ORAL
  Filled 2013-08-20 (×2): qty 1

## 2013-08-20 MED ORDER — SODIUM CHLORIDE 0.9 % IV SOLN
Freq: Once | INTRAVENOUS | Status: AC
  Administered 2013-08-20: 08:00:00 via INTRAVENOUS

## 2013-08-20 MED ORDER — ONDANSETRON HCL 4 MG/2ML IJ SOLN: 4.0000 mg | Freq: Four times a day (QID) | INTRAMUSCULAR | Status: DC | PRN

## 2013-08-20 MED ORDER — OXYCODONE HCL 5 MG PO TABS
5.0000 mg | ORAL_TABLET | ORAL | Status: DC | PRN
  Administered 2013-08-21 (×2): 5 mg via ORAL
  Filled 2013-08-20 (×2): qty 1

## 2013-08-20 MED ORDER — BACLOFEN 10 MG PO TABS
5.0000 mg | ORAL_TABLET | Freq: Every day | ORAL | Status: DC
Start: 2013-08-21 — End: 2013-08-21
  Administered 2013-08-21: 5 mg via ORAL
  Filled 2013-08-20: qty 1
  Filled 2013-08-20 (×4): qty 0.5

## 2013-08-20 MED ORDER — IPRATROPIUM-ALBUTEROL 0.5-2.5 (3) MG/3ML IN SOLN
3.0000 mL | Freq: Once | RESPIRATORY_TRACT | Status: AC
  Administered 2013-08-20: 3 mL via RESPIRATORY_TRACT
  Filled 2013-08-20: qty 3

## 2013-08-20 MED ORDER — DIPHENHYDRAMINE HCL 12.5 MG/5ML PO ELIX
12.5000 mg | ORAL_SOLUTION | Freq: Four times a day (QID) | ORAL | Status: DC
  Administered 2013-08-20 – 2013-08-21 (×5): 12.5 mg via ORAL
  Filled 2013-08-20 (×5): qty 5

## 2013-08-20 MED ORDER — DULOXETINE HCL 60 MG PO CPEP
60.0000 mg | ORAL_CAPSULE | Freq: Every morning | ORAL | Status: DC
  Administered 2013-08-21: 60 mg via ORAL
  Filled 2013-08-20 (×2): qty 1

## 2013-08-20 MED ORDER — ALBUTEROL SULFATE (2.5 MG/3ML) 0.083% IN NEBU: 2.5000 mg | INHALATION_SOLUTION | Freq: Once | RESPIRATORY_TRACT | Status: DC

## 2013-08-20 MED ORDER — DONEPEZIL HCL 5 MG PO TABS
5.0000 mg | ORAL_TABLET | Freq: Every day | ORAL | Status: DC
  Administered 2013-08-20: 5 mg via ORAL
  Filled 2013-08-20: qty 1

## 2013-08-20 MED ORDER — IPRATROPIUM BROMIDE 0.02 % IN SOLN: 0.5000 mg | Freq: Once | RESPIRATORY_TRACT | Status: DC

## 2013-08-20 MED ORDER — FENTANYL 25 MCG/HR TD PT72
25.0000 ug | MEDICATED_PATCH | TRANSDERMAL | Status: DC
  Administered 2013-08-20: 25 ug via TRANSDERMAL
  Filled 2013-08-20: qty 1

## 2013-08-20 MED ORDER — PREDNISONE 5 MG PO TABS
7.5000 mg | ORAL_TABLET | Freq: Every day | ORAL | Status: DC
  Administered 2013-08-21: 7.5 mg via ORAL
  Filled 2013-08-20 (×4): qty 1.5

## 2013-08-20 MED ORDER — AMITRIPTYLINE HCL 25 MG PO TABS
100.0000 mg | ORAL_TABLET | Freq: Every day | ORAL | Status: DC
  Administered 2013-08-20: 100 mg via ORAL
  Filled 2013-08-20: qty 4

## 2013-08-20 MED ORDER — GUAIFENESIN-DM 100-10 MG/5ML PO SYRP: 5.0000 mL | ORAL_SOLUTION | ORAL | Status: DC | PRN

## 2013-08-20 MED ORDER — ACETAMINOPHEN 325 MG PO TABS: 650.0000 mg | ORAL_TABLET | Freq: Four times a day (QID) | ORAL | Status: DC | PRN

## 2013-08-20 MED ORDER — DOCUSATE SODIUM 100 MG PO CAPS
100.0000 mg | ORAL_CAPSULE | Freq: Two times a day (BID) | ORAL | Status: DC
  Administered 2013-08-20 – 2013-08-21 (×3): 100 mg via ORAL
  Filled 2013-08-20 (×3): qty 1

## 2013-08-20 MED ORDER — LIDOCAINE-PRILOCAINE 2.5-2.5 % EX CREA
1.0000 "application " | TOPICAL_CREAM | CUTANEOUS | Status: DC | PRN
  Filled 2013-08-20: qty 5

## 2013-08-20 MED ORDER — METHYLPREDNISOLONE SODIUM SUCC 40 MG IJ SOLR
40.0000 mg | Freq: Once | INTRAMUSCULAR | Status: AC
  Administered 2013-08-20: 40 mg via INTRAVENOUS
  Filled 2013-08-20: qty 1

## 2013-08-20 MED ORDER — HEPARIN SODIUM (PORCINE) 5000 UNIT/ML IJ SOLN
5000.0000 [IU] | Freq: Three times a day (TID) | INTRAMUSCULAR | Status: DC
  Administered 2013-08-20 – 2013-08-21 (×3): 5000 [IU] via SUBCUTANEOUS
  Filled 2013-08-20 (×3): qty 1

## 2013-08-20 MED ORDER — TIOTROPIUM BROMIDE MONOHYDRATE 18 MCG IN CAPS
18.0000 ug | ORAL_CAPSULE | Freq: Every day | RESPIRATORY_TRACT | Status: DC
  Administered 2013-08-21: 18 ug via RESPIRATORY_TRACT
  Filled 2013-08-20: qty 5

## 2013-08-20 MED ORDER — SIMVASTATIN 20 MG PO TABS
20.0000 mg | ORAL_TABLET | Freq: Every day | ORAL | Status: DC
  Administered 2013-08-20: 20 mg via ORAL
  Filled 2013-08-20: qty 1

## 2013-08-20 MED ORDER — SODIUM CHLORIDE 0.9 % IJ SOLN
INTRAMUSCULAR | Status: AC
  Administered 2013-08-20: 3 mL
  Filled 2013-08-20: qty 30

## 2013-08-20 MED ORDER — FAMOTIDINE IN NACL 20-0.9 MG/50ML-% IV SOLN
20.0000 mg | Freq: Two times a day (BID) | INTRAVENOUS | Status: AC
  Administered 2013-08-20 – 2013-08-21 (×2): 20 mg via INTRAVENOUS
  Filled 2013-08-20 (×2): qty 50

## 2013-08-20 MED ORDER — FAMOTIDINE IN NACL 20-0.9 MG/50ML-% IV SOLN
20.0000 mg | Freq: Once | INTRAVENOUS | Status: AC
  Administered 2013-08-20: 20 mg via INTRAVENOUS
  Filled 2013-08-20: qty 50

## 2013-08-20 MED ORDER — POTASSIUM CHLORIDE IN NACL 20-0.9 MEQ/L-% IV SOLN
INTRAVENOUS | Status: DC
  Administered 2013-08-20 – 2013-08-21 (×2): via INTRAVENOUS

## 2013-08-20 MED ORDER — ASPIRIN 81 MG PO CHEW
324.0000 mg | CHEWABLE_TABLET | Freq: Once | ORAL | Status: AC
  Administered 2013-08-20: 324 mg via ORAL
  Filled 2013-08-20: qty 4

## 2013-08-20 MED ORDER — METOPROLOL TARTRATE 25 MG PO TABS
12.5000 mg | ORAL_TABLET | Freq: Two times a day (BID) | ORAL | Status: DC
  Administered 2013-08-20 – 2013-08-21 (×3): 12.5 mg via ORAL
  Filled 2013-08-20 (×3): qty 1

## 2013-08-20 MED ORDER — LIDOCAINE 5 % EX PTCH
1.0000 | MEDICATED_PATCH | CUTANEOUS | Status: DC
  Administered 2013-08-20: 1 via TRANSDERMAL
  Filled 2013-08-20 (×4): qty 1

## 2013-08-20 MED ORDER — METHYLPREDNISOLONE SODIUM SUCC 40 MG IJ SOLR
40.0000 mg | Freq: Two times a day (BID) | INTRAMUSCULAR | Status: AC
  Administered 2013-08-20 – 2013-08-21 (×2): 40 mg via INTRAVENOUS
  Filled 2013-08-20 (×2): qty 1

## 2013-08-20 NOTE — Progress Notes (Signed)
UR completed 

## 2013-08-20 NOTE — H&P (Signed)
[facility] History and Physical  JOCI DRESS CVE:[id number] DOB: 1955/10/29 DOA: 08/20/2013  Referring physician: ED physician, Dr. Wyvonnia Dusky PCP: Vic Blackbird, MD   Chief Complaint: Facial swelling.  HPI: Tonya Sullivan is a 58 y.o. female with a recent diagnosis of small cell carcinoma of the lung, hypothyroidism, COPD, and major depression/anxiety, who presents to the emergency department  with complaint of facial swelling. The patient states that yesterday morning, she had mild swelling of her face and particularly around both eyes. The swelling subsided. The swelling returned this morning at approximately 3 AM. The area around both eyes were significantly swollen with some puffiness over her cheeks. The area around her right eye was more swollen than left eye. She denies difficulty seeing or photophobia. She denies associated headache. She also developed generalized swelling in her arms and her legs. She also felt as if there was some swelling in her throat. She had no difficulty swallowing or breathing. She had no chest pain or shortness of breath or pleurisy or palpitations, but she did have some increase in her wheezing. She denies a rash. Her symptoms started the day following her first round of chemotherapy and radiation therapy. The only other change in her general regimen was that she added peppermint oil to her coffee yesterday morning. She does not know if the peppermint oil caused her symptoms, but she believes her symptoms were associated with chemotherapy and radiation therapy. Currently, the swelling has subsided and she has no complaints. Specifically no complaints of swallowing, no complaints of shortness of breath, and no complaints of chest pain.  In the ED, she was afebrile and hemodynamically stable. Her chest x-ray revealed right upper lobe pulmonary nodule and right hilar lymphadenopathy as seen on prior studies, but no acute abnormalities. Her EKG reveals normal  sinus rhythm with nonspecific T-wave abnormalities and a heart rate of 74 beats per minute. Her lab data are significant for a troponin I. is 0.46, total CK of 1866, and hemoglobin of 11.2. She is being admitted for further evaluation and management.   Review of Systems:  As above in history present illness, otherwise negative.  Past Medical History  Diagnosis Date  . COPD (chronic obstructive pulmonary disease)   . Depression   . Chronic back pain     DDD, disc bulge, radiculopathy, spinal stenosis  . Hyperlipidemia   . Anxiety   . Bipolar disorder, unspecified   . Carotid artery calcification   . DDD (degenerative disc disease), lumbosacral   . Spinal stenosis, lumbar   . Thyroid disease   . Autoimmune thyroiditis   . Hypertension   . TIA (transient ischemic attack)     august 2014  . GERD (gastroesophageal reflux disease)   . H/O dizziness   . Neuropathic pain   . Adrenal gland dysfunction     Addison's disease ON DAILY PREDNISONE  . Addison's disease   . Stroke     HX OF MINI STROKE AUG 2014- DROPPIN EVERYTHING, SPEECH SLURRED, MEMORY AFFECTED --ALL RESOLVED - NO DEFICITS NOW  . Pneumonia     LAST PNEUMONIA WAS AUG 2014  . Achalasia   . Mouth ulcers   . Dysrhythmia     palpitations  . Melanoma     removed 1 week ago at Dr Teryl Lucy office  . Addison's disease   . Frequent falls   . 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 Not taking meds at this time  .  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 Hypothyroidism    Past Surgical History  Procedure Laterality Date  . Appendectomy    . Cholecystectomy    . Lump left breast      removed- benign  . Abdominal hysterectomy      tubal pregnancy  . Inguinal hernia repair    . Stomach surgery      ?holes in esophgous  . Ectopic pregnancy surgery    . Back surgery      x 5;1984;1989;;1999;2000;2010  . Colonoscopy  01/25/2012    Procedure: COLONOSCOPY;  Surgeon: Rogene Houston, MD;  Location: [facility];  Service: Endoscopy;  Laterality: N/A;  130  . Esophagogastroduodenoscopy (egd) with esophageal dilation N/A 08/13/2012    Procedure: ESOPHAGOGASTRODUODENOSCOPY (EGD) WITH ESOPHAGEAL DILATION;  Surgeon: Rogene Houston, MD;  Location: [facility];  Service: Endoscopy;  Laterality: N/A;  315  . Anterior cervical decomp/discectomy fusion N/A 10/21/2012    Procedure: Cervical Six-Seven Anterior cervical decompression/diskectomy/fusion;  Surgeon: Kristeen Miss, MD;  Location: [facility];  Service: Neurosurgery;  Laterality: N/A;  Cervical Six-Seven Anterior cervical decompression/diskectomy/fusion  . Cataract extraction w/phaco Right 11/25/2012    Procedure: CATARACT EXTRACTION PHACO AND INTRAOCULAR LENS PLACEMENT (IOC);  Surgeon: Elta Guadeloupe T. Gershon Crane, MD;  Location: [facility];  Service: Ophthalmology;  Laterality: Right;  CDE:10.06  . Cataract extraction w/phaco Left 12/09/2012    Procedure: CATARACT EXTRACTION PHACO AND INTRAOCULAR LENS PLACEMENT (IOC);  Surgeon: Elta Guadeloupe T. Gershon Crane, MD;  Location: [facility];  Service: Ophthalmology;  Laterality: Left;  CDE:5.06  . Tonsillectomy    . Hiatal hernia repair    . Heller myotomy N/A 03/17/2013    Procedure: DIAGNOSTIC LAPAROSCOPY, LAPAROSCOPIC HELLER MYOTOMY, ENDOSCOPY, DOR FUNDOPLICATION;  Surgeon: Ralene Ok, MD;  Location: [facility];  Service: General;  Laterality: N/A;  . Breast surgery      left breast  . Eye surgery    . Video bronchoscopy with endobronchial ultrasound N/A 07/24/2013    Procedure: VIDEO BRONCHOSCOPY WITH ENDOBRONCHIAL ULTRASOUND;  Surgeon: [name], MD;  Location: [facility];  Service: Thoracic;  Laterality: N/A;   Social History: She is widowed. She has one son. She reports that she has been smoking Cigarettes and E-cigarettes.  She has a 10 pack-year smoking history. She has decreased her smoking from 2 packs of cigarettes to one quarter pack of cigarettes daily. She has never used smokeless tobacco. She reports that she does not  drink alcohol or use illicit drugs.  Allergies  Allergen Reactions  . Codeine Itching  . Cyclobenzaprine Hives  . Darvocet [Propoxyphene N-Acetaminophen] Rash  . Abilify [Aripiprazole] Other (See Comments)    tremors  . Ace Inhibitors Other (See Comments)    unknown  . Adhesive [Tape] Hives  . Gabapentin Swelling  . Iodine Hives  . Levofloxacin Hives and Hypertension  . Prednisone Hives, Itching and Hypertension  . Sulfa Antibiotics Itching  . Robaxin [Methocarbamol] Rash    Family History  Problem Relation Age of Onset  . Hypertension Mother   . Heart disease Mother   . Heart attack Mother   . Anxiety disorder Mother   . Cancer Father     lung cancer  . Heart attack Father   . Alcohol abuse Father   . Hypertension Brother   . Alcohol abuse Brother   . Bipolar disorder Brother   . ADD / ADHD Brother   . Drug abuse Brother   . OCD Brother   .  Alcohol abuse Sister   . Bipolar disorder Sister   . Anxiety disorder Sister   . ADD / ADHD Sister   . Drug abuse Sister   . Physical abuse Sister   . Sexual abuse Sister   . Alcohol abuse Brother   . Bipolar disorder Brother   . ADD / ADHD Brother   . Drug abuse Brother   . Alcohol abuse Sister   . Bipolar disorder Sister   . Anxiety disorder Sister   . ADD / ADHD Sister   . Physical abuse Sister   . Sexual abuse Sister   . Alcohol abuse Brother   . Bipolar disorder Brother   . ADD / ADHD Brother   . Bipolar disorder Brother   . ADD / ADHD Brother   . Alcohol abuse Brother   . Paranoid behavior Brother   . Physical abuse Brother   . Sexual abuse Brother   . Bipolar disorder Maternal Aunt   . Bipolar disorder Paternal Aunt   . Bipolar disorder Maternal Uncle   . Bipolar disorder Paternal Uncle   . Bipolar disorder Maternal Grandfather   . Alcohol abuse Maternal Grandfather   . Bipolar disorder Maternal Grandmother   . Alcohol abuse Maternal Grandmother   . Anxiety disorder Maternal Grandmother   . Dementia  Maternal Grandmother   . Bipolar disorder Paternal Grandfather   . Alcohol abuse Paternal Grandfather   . Bipolar disorder Paternal Grandmother   . Alcohol abuse Paternal Grandmother   . Drug abuse Paternal Grandmother   . Bipolar disorder Maternal Uncle   . Schizophrenia Neg Hx   . Seizures Neg Hx      Prior to Admission medications   Medication Sig Start Date End Date Taking? Authorizing Provider  albuterol (PROVENTIL HFA;VENTOLIN HFA) 108 (90 BASE) MCG/ACT inhaler Inhale 2 puffs into the lungs 3 (three) times daily as needed for wheezing or shortness of breath. 05/26/13   Nena Polio, PA-C  albuterol (PROVENTIL) (2.5 MG/3ML) 0.083% nebulizer solution Take 3 mLs (2.5 mg total) by nebulization every 4 (four) hours as needed for wheezing. 05/26/13   Nena Polio, PA-C  amitriptyline (ELAVIL) 100 MG tablet Take 1 tablet (100 mg total) by mouth at bedtime. 05/29/13   Levonne Spiller, MD  baclofen (LIORESAL) 10 MG tablet Take 5 mg by mouth daily. 05/26/13   Nena Polio, PA-C  clonazePAM (KLONOPIN) 1 MG tablet Take 1 tablet (1 mg total) by mouth 4 (four) times daily. 05/29/13   Levonne Spiller, MD  divalproex (DEPAKOTE ER) 500 MG 24 hr tablet Take 1 tablet (500 mg total) by mouth daily. 06/26/13   Levonne Spiller, MD  donepezil (ARICEPT) 5 MG tablet Take 1 tablet (5 mg total) by mouth at bedtime. 06/18/13   Alycia Rossetti, MD  DULoxetine (CYMBALTA) 60 MG capsule Take 1 capsule (60 mg total) by mouth every morning. 05/29/13   Levonne Spiller, MD  fentaNYL (DURAGESIC) 25 MCG/HR patch Place 1 patch (25 mcg total) onto the skin every 3 (three) days. 08/11/13   Alycia Rossetti, MD  levothyroxine (SYNTHROID, LEVOTHROID) 25 MCG tablet Take 25 mcg by mouth daily before breakfast.    Historical Provider, MD  lidocaine (LIDODERM) 5 % APPLY ONE PATCH TO SKIN EVERY 12 HOURS. 12 HOURS OFF. 08/17/13   Alycia Rossetti, MD  lidocaine-prilocaine (EMLA) cream Apply 1 application topically as needed. Apply to port 1 hr before  chemo 08/18/13   Curt Bears, MD  metoprolol tartrate (LOPRESSOR) 25 MG tablet  Take 12.5 mg by mouth 2 (two) times daily with a meal. 05/26/13   Nena Polio, PA-C  Multiple Vitamins-Minerals (HAIR/SKIN/NAILS/BIOTIN PO) Take 1 tablet by mouth daily.    Historical Provider, MD  pantoprazole (PROTONIX) 40 MG tablet Take 1 tablet (40 mg total) by mouth daily. 07/10/13   Alycia Rossetti, MD  pravastatin (PRAVACHOL) 40 MG tablet Take 1 tablet (40 mg total) by mouth every evening. 06/25/13   Alycia Rossetti, MD  predniSONE (DELTASONE) 2.5 MG tablet Take 3 tablets (7.5 mg total) by mouth daily with breakfast. 05/26/13   Nena Polio, PA-C  prochlorperazine (COMPAZINE) 10 MG tablet Take 1 tablet (10 mg total) by mouth every 6 (six) hours as needed for nausea or vomiting. 08/06/13   Curt Bears, MD  tiotropium (SPIRIVA) 18 MCG inhalation capsule Place 1 capsule (18 mcg total) into inhaler and inhale daily. 07/10/13   Alycia Rossetti, MD   Physical Exam: Filed Vitals:   08/20/13 0700 08/20/13 0813 08/20/13 0833 08/20/13 0900  BP: 121/70  112/55 118/59  Pulse: 72  84 81  Temp: 97.8 F (36.6 C)     TempSrc: Oral     Resp: 18  18 20   Height:      Weight:      SpO2: 92% 9% 95% 98%    Wt Readings from Last 3 Encounters:  08/20/13 87.998 kg (194 lb)  08/13/13 87.091 kg (192 lb)  08/06/13 89.132 kg (196 lb 8 oz)    General:  Appears calm and comfortable; no acute distress. Eyes: Mild right periorbital edema without associated erythema. Conjunctivae are clear; sclerae are white. PERRL, normal lids, irises & conjunctiva ENT: grossly normal hearing, lips & tongue; no swelling of her lips or tongue; no posterior pharyngeal edema or erythema, mucous membranes are dry. Neck: no LAD, masses or thyromegaly Cardiovascular: S1, S2, with a soft systolic murmur. Trace of pedal edema bilaterally. Telemetry: SR, no arrhythmias  Respiratory: Scattered fine crackles and wheezes bilaterally. Breathing is  nonlabored. Abdomen: Obese, positive bowel sounds, soft, ntnd Skin: Few excoriations on her pretibial areas of both legs without significant erythema and no drainage. Musculoskeletal: grossly normal tone BUE/BLE Psychiatric: grossly normal mood and affect, speech fluent and appropriate Neurologic: grossly non-focal; cranial nerves II through XII are grossly intact.           Labs on Admission:  Basic Metabolic Panel:  Recent Labs Lab 08/20/13 0613  NA 138  K 4.8  CL 100  CO2 29  GLUCOSE 101*  BUN 16  CREATININE 0.88  CALCIUM 8.4   Liver Function Tests:  Recent Labs Lab 08/20/13 0613  AST 13  ALT 9  ALKPHOS 48  BILITOT <0.2*  PROT 5.9*  ALBUMIN 2.8*   No results found for this basename: LIPASE, AMYLASE,  in the last 168 hours No results found for this basename: AMMONIA,  in the last 168 hours CBC:  Recent Labs Lab 08/20/13 0613  WBC 8.6  NEUTROABS 6.8  HGB 11.2*  HCT 34.2*  MCV 90.2  PLT 230   Cardiac Enzymes:  Recent Labs Lab 08/20/13 0613  TROPONINI 0.46*    BNP (last 3 results)  Recent Labs  10/25/12 0930 08/20/13 0613  PROBNP 209.0* 1866.0*   CBG: No results found for this basename: GLUCAP,  in the last 168 hours  Radiological Exams on Admission: Dg Chest 2 View  08/20/2013   CLINICAL DATA:  Facial swelling.  Allergic reaction.  EXAM: CHEST  2 VIEW  COMPARISON:  PET CT scan 07/31/2013. Single view of the chest 07/24/2013. CT chest 07/14/2013.  FINDINGS: Small amount of fluid or thickening in the minor fissure is noted. Right hilar adenopathy is seen as on the prior examinations. There is some dependent atelectasis in the lung bases. No pneumothorax or pleural effusion. Right upper lobe pulmonary nodule is identified. Heart size is normal.  IMPRESSION: No acute abnormality.  Right upper lobe pulmonary nodule and right hilar lymphadenopathy are seen as on prior studies.   Electronically Signed   By: Inge Rise M.D.   On: 08/20/2013 07:21      EKG: Independently reviewed. Normal sinus rhythm with a heart rate of 74 beats per minute and nonspecific T-wave abnormalities.  Assessment/Plan Principal Problem:   Facial swelling Active Problems:   Elevated troponin   COPD (chronic obstructive pulmonary disease)   Adrenal cortical insufficiency   Small cell carcinoma of lung   Depression   Chronic pain   Tobacco use   Anemia, unspecified   Obesity, morbid   HTN (hypertension)   Hypothyroidism   1. Facial swelling/periorbital edema. Etiology unclear, but the patient believes that she had "an allergic reaction" to either the chemotherapy or the radiation therapy or both. She also noted that she tried peppermint oil added her coffee a couple days ago, but she does not believe the peppermint oil caused her symptomatology. Nevertheless, I instructed her not to use the peppermint oil again. She received Solu-Medrol, Pepcid, and Benadryl in the ED. Her symptoms have all but completely resolved. We will continue to monitor her in the step down unit for observation for 24 hours. We'll give her another dose of Solu-Medrol and several more doses of Benadryl and Pepcid. 2. Elevated troponin I. Per chart review, her troponin I was elevated in April 2015, but the exact etiology was not clearly elucidated, but may have been secondary to demand ischemia. Her 2-D echocardiogram at that time revealed preserved left ventricular systolic function with an ejection fraction of 60-65%. She is chest pain-free and her EKG reveals nonspecific changes. [facility] consult cardiology for further evaluation and recommendations. We'll cycle cardiac enzymes. 3. Hypertension. Currently stable. Continue metoprolol. 4. COPD with mild exacerbation. Will restart Spiriva and give albuterol nebulizer every 4 hours scheduled. We'll also continue gentle IV steroids. 5. Tobacco abuse. The patient has decreased her smoking significantly, but she was encouraged to stop smoking  altogether. 6. Chronic prednisone therapy secondary to adrenal insufficiency/Addison's disease. We'll continue chronic steroid therapy. 7. Hypothyroidism. We'll continue Synthroid and check a TSH. 8. Small cell carcinoma of the lung. The patient was diagnosed last month. She underwent chemotherapy and radiation therapy a few days ago. In light of the possible allergic reaction, we will consult oncology for further recommendations.    Code Status: Full code DVT Prophylaxis: Subcutaneous heparin Family Communication: Discussed with patient; no family available Disposition Plan: Anticipate discharge to home in the next 24-48 hours.  Time spent: One hour and 10 minutes.  [facility] Pager [phone]  **Disclaimer: This note may have been dictated with voice recognition software. Similar sounding words can inadvertently be transcribed and this note may contain transcription errors which may not have been corrected upon publication of note.**

## 2013-08-20 NOTE — Consult Note (Signed)
[facility] Consultation Oncology  Name: Tonya Sullivan      MRN: [id number]    Location: IC08/IC08-01  Date: 08/20/2013 Time:12:36 PM   REFERRING PHYSICIAN:  Rexene Alberts, MD  REASON FOR CONSULT:  Lung cancer   DIAGNOSIS:  Abnormal periorbital, facial, and UE edema noted at 0300 hours on 08/20/2013 in the setting of active chemotherapy consisting of Cisplatin/Etoposide days 1-3 with Neulasta support beginning on 08/18/2013.  HISTORY OF PRESENT ILLNESS:   Tonya Sullivan is a 58 yo white woman who was diagnosed with limited stage small cell lung cancer, under active treatment by Dr. Curt Bears with Cisplatin on day 1 and Etoposide on days 1-3 with Neulasta support on Day 4.  She is currently on day 3 of cycle 1 with chemotherapy administered on 8/18 and 8/19 as planned.  She has a past medication history significant for COPD, depression, chronic pain, Addison's disease, HTN, obesity, hypothyroidism, hyperlipidemia, bipolar disorder, carotid stenosis, TIA, MDD, PTSD, and GERD.  She presented to the [facility] ED on 8/20 after being advised by oncology nurse to report to nearest ED due to facial swelling.  Tonya Sullivan reports that at 0300 hours on 08/20/2013 she woke from sleep with a periorbital swelling (R > L), facial swelling, and Bilateral upper extremity swelling.  Additionally, she noted tongue swelling.  She denied any problems with seeing, photophobia, headaches, difficulty swallowing or breathing, chest pain, SOB, palpitations, pleurisy, but did note increased wheezing.  She reports that about 20 minutes after being seen in the ED, she started to feel better. She received Solu-Medrol, Pepcid, and Benadryl in the ED.  ED notes reports the following physical exam findings:  Constitutional: She is oriented to person, place, and time. She appears well-developed and well-nourished. No distress.  Erythematous cheeks, moon facies  HENT:  Head: Normocephalic and atraumatic.  Mouth/Throat:  Oropharynx is clear and moist. No oropharyngeal exudate.  OP clear, no assymetry  Eyes: Conjunctivae and EOM are normal. Pupils are equal, round, and reactive to light.  Neck: Normal range of motion. Neck supple.  No meningismus.  Cardiovascular: Normal rate, regular rhythm, normal heart sounds and intact distal pulses.  No murmur heard.  Pulmonary/Chest: Effort normal. No respiratory distress. She has wheezes.  Scattered expiratory wheezing  Abdominal: Soft. There is no tenderness. There is no rebound and no guarding.  Musculoskeletal: Normal range of motion. She exhibits edema. She exhibits no tenderness.  Trace pedal edema and edema to dorsal hands  Neurological: She is alert and oriented to person, place, and time. No cranial nerve deficit. She exhibits normal muscle tone. Coordination normal.  No ataxia on finger to nose bilaterally. No pronator drift. 5/5 strength throughout. CN 2-12 intact. Negative Romberg. Equal grip strength. Sensation intact. Gait is normal.  Skin: Skin is warm.  Psychiatric: She has a normal mood and affect. Her behavior is normal.   Additionally, in the ED, her troponin level was noted to be elevated and cardiology is following.  I personally reviewed and went over laboratory results with the patient.  The results are noted within this dictation.  I personally reviewed and went over radiographic studies with the patient.  The results are noted within this dictation.    Today, she denies any complaints and reports that she feels improved.   PAST MEDICAL HISTORY:   Past Medical History  Diagnosis Date  . COPD (chronic obstructive pulmonary disease)   . Depression   . Chronic back pain     DDD,  disc bulge, radiculopathy, spinal stenosis  . Hyperlipidemia   . Anxiety   . Bipolar disorder, unspecified   . Carotid artery calcification   . DDD (degenerative disc disease), lumbosacral   . Spinal stenosis, lumbar   . Autoimmune thyroiditis   . Essential  hypertension, benign   . TIA (transient ischemic attack)     August 2014  . GERD (gastroesophageal reflux disease)   . Neuropathic pain   . Addison's disease     On Prednisone  . History of pneumonia   . Achalasia   . Melanoma   . Frequent falls   . DM (diabetes mellitus) with complications   . 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 Hypothyroidism     ALLERGIES: Allergies  Allergen Reactions  . Codeine Itching  . Cyclobenzaprine Hives  . Darvocet [Propoxyphene N-Acetaminophen] Rash  . Abilify [Aripiprazole] Other (See Comments)    tremors  . Ace Inhibitors Other (See Comments)    unknown  . Adhesive [Tape] Hives  . Gabapentin Swelling  . Iodine Hives  . Levofloxacin Hives and Hypertension  . Prednisone Hives, Itching and Hypertension  . Sulfa Antibiotics Itching  . Robaxin [Methocarbamol] Rash      MEDICATIONS: I have reviewed the patient's current medications.     PAST SURGICAL HISTORY Past Surgical History  Procedure Laterality Date  . Appendectomy    . Cholecystectomy    . Lump left breast      Benign  . Abdominal hysterectomy      Tubal pregnancy  . Inguinal hernia repair    . Stomach surgery    . Ectopic pregnancy surgery    . Back surgery      x 5;1984;1989;;1999;2000;2010  . Colonoscopy  01/25/2012    Procedure: COLONOSCOPY;  Surgeon: Rogene Houston, MD;  Location: [facility];  Service: Endoscopy;  Laterality: N/A;  130  . Esophagogastroduodenoscopy (egd) with esophageal dilation N/A 08/13/2012    Procedure: ESOPHAGOGASTRODUODENOSCOPY (EGD) WITH ESOPHAGEAL DILATION;  Surgeon: Rogene Houston, MD;  Location: [facility];  Service: Endoscopy;  Laterality: N/A;  315  . Anterior cervical decomp/discectomy fusion N/A 10/21/2012    Procedure: Cervical Six-Seven Anterior cervical decompression/diskectomy/fusion;  Surgeon: Kristeen Miss, MD;  Location: [place] [facility] OR[facility];  Service: Neurosurgery;  Laterality: N/A;  Cervical Six-Seven Anterior cervical  decompression/diskectomy/fusion  . Cataract extraction w/phaco Right 11/25/2012    Procedure: CATARACT EXTRACTION PHACO AND INTRAOCULAR LENS PLACEMENT (IOC);  Surgeon: Elta Guadeloupe T. Gershon Crane, MD;  Location: [facility] ORS;  Service: Ophthalmology;  Laterality: Right;  CDE:10.06  . Cataract extraction w/phaco Left 12/09/2012    Procedure: CATARACT EXTRACTION PHACO AND INTRAOCULAR LENS PLACEMENT (IOC);  Surgeon: Elta Guadeloupe T. Gershon Crane, MD;  Location: [facility];  Service: Ophthalmology;  Laterality: Left;  CDE:5.06  . Tonsillectomy    . Hiatal hernia repair    . Heller myotomy N/A 03/17/2013    Procedure: DIAGNOSTIC LAPAROSCOPY, LAPAROSCOPIC HELLER MYOTOMY, ENDOSCOPY, DOR FUNDOPLICATION;  Surgeon: Ralene Ok, MD;  Location: [facility];  Service: General;  Laterality: N/A;  . Breast surgery      Left breast  . Eye surgery    . Video bronchoscopy with endobronchial ultrasound N/A 07/24/2013    Procedure: VIDEO BRONCHOSCOPY WITH ENDOBRONCHIAL ULTRASOUND;  Surgeon: [name], MD;  Location: [facility] OR;  Service: Thoracic;  Laterality: N/A;    FAMILY HISTORY: Family History  Problem Relation Age of Onset  . Hypertension Mother   . Heart disease Mother   . Heart attack Mother   .  Anxiety disorder Mother   . Lung cancer Father   . Heart attack Father   . Alcohol abuse Father   . Hypertension Brother   . Alcohol abuse Brother   . Bipolar disorder Brother   . ADD / ADHD Brother   . Drug abuse Brother   . OCD Brother   . Alcohol abuse Sister   . Bipolar disorder Sister   . Anxiety disorder Sister   . ADD / ADHD Sister   . Drug abuse Sister   . Physical abuse Sister   . Sexual abuse Sister   . Alcohol abuse Brother   . Bipolar disorder Brother   . ADD / ADHD Brother   . Drug abuse Brother   . Alcohol abuse Sister   . Bipolar disorder Sister   . Anxiety disorder Sister   . ADD / ADHD Sister   . Physical abuse Sister   . Sexual abuse Sister   . Alcohol abuse Brother   . Bipolar disorder Brother   . ADD /  ADHD Brother   . Bipolar disorder Brother   . ADD / ADHD Brother   . Alcohol abuse Brother   . Paranoid behavior Brother   . Physical abuse Brother   . Sexual abuse Brother   . Bipolar disorder Maternal Aunt   . Bipolar disorder Paternal Aunt   . Bipolar disorder Maternal Uncle   . Bipolar disorder Paternal Uncle   . Bipolar disorder Maternal Grandfather   . Alcohol abuse Maternal Grandfather   . Bipolar disorder Maternal Grandmother   . Alcohol abuse Maternal Grandmother   . Anxiety disorder Maternal Grandmother   . Dementia Maternal Grandmother   . Bipolar disorder Paternal Grandfather   . Alcohol abuse Paternal Grandfather   . Bipolar disorder Paternal Grandmother   . Alcohol abuse Paternal Grandmother   . Drug abuse Paternal Grandmother   . Bipolar disorder Maternal Uncle   . Schizophrenia Neg Hx   . Seizures Neg Hx     SOCIAL HISTORY:  reports that she has been smoking Cigarettes and E-cigarettes.  She has a 10 pack-year smoking history. She has never used smokeless tobacco. She reports that she does not drink alcohol or use illicit drugs.  PERFORMANCE STATUS: The patient's performance status is 2 - Symptomatic, <50% confined to bed  PHYSICAL EXAM: Most Recent Vital Signs: Blood pressure 118/59, pulse 86, temperature 97.8 F (36.6 C), temperature source Oral, resp. rate 25, height 5' 7"  (1.702 m), weight 194 lb (87.998 kg), SpO2 98.00%. General appearance: alert, cooperative, appears older than stated age and no distress Head: Normocephalic, without obvious abnormality, atraumatic, syndromic facies Eyes: negative findings: lids and lashes normal, conjunctivae and sclerae normal, corneas clear and pupils equal, round, reactive to light and accomodation Throat: lips, mucosa, and tongue normal; teeth and gums normal and upper dentures in place Neck: supple, symmetrical, trachea midline and wide, without obvious jugular distension Lungs: clear to auscultation  bilaterally Heart: regular rate and rhythm Abdomen: soft, non-tender; bowel sounds normal; no masses,  no organomegaly Extremities: Homans sign is negative, no sign of DVT Skin: Skin color, texture, turgor normal. No rashes or lesions Neurologic: Grossly normal  LABORATORY DATA:  Results for orders placed during the hospital encounter of 08/20/13 (from the past 48 hour(s))  CBC WITH DIFFERENTIAL     Status: Abnormal   Collection Time    08/20/13  6:13 AM      Result Value Ref Range   WBC 8.6  4.0 -  10.5 K/uL   RBC 3.79 (*) 3.87 - 5.11 MIL/uL   Hemoglobin 11.2 (*) 12.0 - 15.0 g/dL   HCT 34.2 (*) 36.0 - 46.0 %   MCV 90.2  78.0 - 100.0 fL   MCH 29.6  26.0 - 34.0 pg   MCHC 32.7  30.0 - 36.0 g/dL   RDW 14.4  11.5 - 15.5 %   Platelets 230  150 - 400 K/uL   Neutrophils Relative % 79 (*) 43 - 77 %   Neutro Abs 6.8  1.7 - 7.7 K/uL   Lymphocytes Relative 14  12 - 46 %   Lymphs Abs 1.2  0.7 - 4.0 K/uL   Monocytes Relative 7  3 - 12 %   Monocytes Absolute 0.6  0.1 - 1.0 K/uL   Eosinophils Relative 0  0 - 5 %   Eosinophils Absolute 0.0  0.0 - 0.7 K/uL   Basophils Relative 0  0 - 1 %   Basophils Absolute 0.0  0.0 - 0.1 K/uL  COMPREHENSIVE METABOLIC PANEL     Status: Abnormal   Collection Time    08/20/13  6:13 AM      Result Value Ref Range   Sodium 138  137 - 147 mEq/L   Potassium 4.8  3.7 - 5.3 mEq/L   Chloride 100  96 - 112 mEq/L   CO2 29  19 - 32 mEq/L   Glucose, Bld 101 (*) 70 - 99 mg/dL   BUN 16  6 - 23 mg/dL   Creatinine, Ser 0.88  0.50 - 1.10 mg/dL   Calcium 8.4  8.4 - 10.5 mg/dL   Total Protein 5.9 (*) 6.0 - 8.3 g/dL   Albumin 2.8 (*) 3.5 - 5.2 g/dL   AST 13  0 - 37 U/L   ALT 9  0 - 35 U/L   Alkaline Phosphatase 48  39 - 117 U/L   Total Bilirubin <0.2 (*) 0.3 - 1.2 mg/dL   GFR calc non Af Amer 71 (*) >90 mL/min   GFR calc Af Amer 82 (*) >90 mL/min   Comment: (NOTE)     The eGFR has been calculated using the CKD EPI equation.     This calculation has not been validated  in all clinical situations.     eGFR's persistently <90 mL/min signify possible Chronic Kidney     Disease.   Anion gap 9  5 - 15  TROPONIN I     Status: Abnormal   Collection Time    08/20/13  6:13 AM      Result Value Ref Range   Troponin I 0.46 (*) <0.30 ng/mL   Comment: CRITICAL RESULT CALLED TO, READ BACK BY AND VERIFIED WITH:     VOGLER,T AT 6:55AM ON 08/20/13 BY FESTERMAN,C                Due to the release kinetics of cTnI,     a negative result within the first hours     of the onset of symptoms does not rule out     myocardial infarction with certainty.     If myocardial infarction is still suspected,     repeat the test at appropriate intervals.  PRO B NATRIURETIC PEPTIDE     Status: Abnormal   Collection Time    08/20/13  6:13 AM      Result Value Ref Range   Pro B Natriuretic peptide (BNP) 1866.0 (*) 0 - 125 pg/mL  TROPONIN I  Status: Abnormal   Collection Time    08/20/13 10:04 AM      Result Value Ref Range   Troponin I 0.47 (*) <0.30 ng/mL   Comment: CRITICAL RESULT CALLED TO, READ BACK BY AND VERIFIED WITH:     SHONEWITZ,L AT 10:43 AM ON 08/20/13 BY FESTERMAN,C                Due to the release kinetics of cTnI,     a negative result within the first hours     of the onset of symptoms does not rule out     myocardial infarction with certainty.     If myocardial infarction is still suspected,     repeat the test at appropriate intervals.      RADIOGRAPHY: Dg Chest 2 View  08/20/2013   CLINICAL DATA:  Facial swelling.  Allergic reaction.  EXAM: CHEST  2 VIEW  COMPARISON:  PET CT scan 07/31/2013. Single view of the chest 07/24/2013. CT chest 07/14/2013.  FINDINGS: Small amount of fluid or thickening in the minor fissure is noted. Right hilar adenopathy is seen as on the prior examinations. There is some dependent atelectasis in the lung bases. No pneumothorax or pleural effusion. Right upper lobe pulmonary nodule is identified. Heart size is normal.   IMPRESSION: No acute abnormality.  Right upper lobe pulmonary nodule and right hilar lymphadenopathy are seen as on prior studies.   Electronically Signed   By: Inge Rise M.D.   On: 08/20/2013 07:21       PATHOLOGY:  Transbronchial ultrasound-guided lymph node biopsy positive for small cell carcinoma.   ASSESSMENT:  1. Limited stage small cell lung cancer, presently day 3 of cycle 1 (missing planned day 3 of chemotherapy today due to hospitalization) of Cisplatin/Etoposide regimen.  Radiation simulation complete, but not yet started radiation (planned to start today per patient appointment list). 2. Periorbital edema, facial swelling, upper extremity swelling, erythematous rash, resolved S/P anaphylactic treatment.  Differential includes anaphylactic reaction to chemotherapy, reaction to "peppermint oil in coffee," versus vascular event  (which will be evaluated with CT angio of chest to rule out SVC thrombus).  Patient Active Problem List   Diagnosis Date Noted  . Elevated troponin 08/20/2013  . Facial swelling 08/20/2013  . Anemia, unspecified 08/20/2013  . Obesity, morbid 08/20/2013  . HTN (hypertension) 08/20/2013  . Hypothyroidism 08/20/2013  . 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 GERD (gastroesophageal reflux disease) 07/16/2013  . Lung mass 07/15/2013  . Memory problem 06/20/2013  . PTSD (post-traumatic stress disorder) 05/22/2013  . Unspecified episodic mood disorder 05/22/2013  . MDD (major depressive disorder), recurrent severe, without psychosis 05/21/2013  . Chalasia of lower esophageal sphincter 03/18/2013  . 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 Cervical spondylosis without myelopathy 10/31/2012  . Gait disturbance 10/31/2012  . Urinary incontinence 10/15/2012  . Peripheral edema 08/10/2012  . Dysphagia 08/05/2012  . TIA (transient ischemic attack) 08/03/2012  . Dysarthria 08/03/2012  . Stricture and stenosis of esophagus 08/03/2012  . Spontaneous  ecchymoses 07/20/2012  . Fall 05/22/2012  . Hoarse voice quality 05/22/2012  . Essential hypertension 05/01/2012  . Mole of skin 05/01/2012  . Carotid stenosis 01/01/2012  . COPD (chronic obstructive pulmonary disease) 02/19/2011  . Tobacco use 02/19/2011  . Chronic back pain 02/19/2011  . Bipolar disorder 02/19/2011  . Hyperlipidemia 09/26/2010  . Depression 09/26/2010  . Chronic pain 09/26/2010     PLAN:  1. I personally reviewed and went over laboratory  results with the patient.  The results are noted within this dictation. 2. I personally reviewed and went over radiographic studies with the patient.  The results are noted within this dictation.   3. Chart reviewed 4. CT angio of chest to evaluate for superior vena cava thrombus.  Given signs and symptoms, it is wise to rule out a vascular event. 5. If CT angio is negative, patient should follow-up with her treating oncologist to determine best plan of action regarding her treatment plan. Would not advise Neulasta in conjunction with chest radiotherapy which may result in paradoxical prolonged neutropenia due to irradiation of circulating stem cells. 6. Will follow along while an inpatient at [facility].  All questions were answered. The patient knows to call the clinic with any problems, questions or concerns. We can certainly see the patient much sooner if necessary.  Patient and plan discussed with Dr. Farrel Gobble and he is in agreement with the aforementioned.   KEFALAS,THOMAS 08/20/2013

## 2013-08-20 NOTE — ED Notes (Signed)
Pt has audible wheezing.  Reports history of COPD.

## 2013-08-20 NOTE — Consult Note (Signed)
Primary cardiologist: Dr. Carlyle Dolly - saw as consult April 2015 (formerly Dr Verl Blalock) Consulting cardiologist: Dr. Satira Sark  Clinical Summary Ms. Nesser is a medically complex 58 y.o.female last seen in the office by Ms. Lawrence NP in May of this year following inpatient consultation with Dr. Harl Bowie in April secondary to mild troponin I elevations that were felt to be related to supply and demand mismatch in the setting of physiologic stressors.. She is a former patient of Dr. Verl Blalock with history of Addison's disease on chronic steroids. She has had a recent diagnosis of small cell lung carcinoma and presented to the hospital with facial swelling. She is in the process of undergoing chemotherapy and radiation, and thought that it might be related to this. She also  used some peppermint oil in her coffee recently for the first time. Chemotherapy regimen includes etoposide and cisplatin.   As part of her initial lab work, cardiac enzymes were obtained. She denies any chest pain symptoms or palpitations. Troponin I levels are in similar range to the mild abnormalities seen back in April. There is no ECG for review at this time. She is feeling better in terms of her facial swelling. She has been hemodynamically stable. Telemetry is unremarkable so far.   Allergies  Allergen Reactions  . Codeine Itching  . Cyclobenzaprine Hives  . Darvocet [Propoxyphene N-Acetaminophen] Rash  . Abilify [Aripiprazole] Other (See Comments)    tremors  . Ace Inhibitors Other (See Comments)    unknown  . Adhesive [Tape] Hives  . Gabapentin Swelling  . Iodine Hives  . Levofloxacin Hives and Hypertension  . Prednisone Hives, Itching and Hypertension  . Sulfa Antibiotics Itching  . Robaxin [Methocarbamol] Rash    Medications Scheduled Medications: . albuterol  2.5 mg Nebulization Q4H  . amitriptyline  100 mg Oral QHS  . [START ON 08/21/2013] baclofen  5 mg Oral Daily  . clonazePAM  1 mg Oral QID    . diphenhydrAMINE  12.5 mg Oral 4 times per day  . [START ON 08/21/2013] divalproex  500 mg Oral Daily  . docusate sodium  100 mg Oral BID  . donepezil  5 mg Oral QHS  . [START ON 08/21/2013] DULoxetine  60 mg Oral q morning - 10a  . famotidine (PEPCID) IV  20 mg Intravenous Q12H  . fentaNYL  25 mcg Transdermal Q72H  . heparin  5,000 Units Subcutaneous 3 times per day  . [START ON 08/21/2013] levothyroxine  25 mcg Oral QAC breakfast  . [START ON 08/21/2013] lidocaine  1 patch Transdermal Q24H  . methylPREDNISolone (SOLU-MEDROL) injection  40 mg Intravenous Q12H  . metoprolol tartrate  12.5 mg Oral BID WC  . [START ON 08/21/2013] pantoprazole  40 mg Oral Daily  . [START ON 08/21/2013] predniSONE  7.5 mg Oral Q breakfast  . simvastatin  20 mg Oral q1800  . [START ON 08/21/2013] tiotropium  18 mcg Inhalation Daily    Infusions: . 0.9 % NaCl with KCl 20 mEq / L 50 mL/hr at 08/20/13 1100    PRN Medications: acetaminophen, acetaminophen, albuterol, alum & mag hydroxide-simeth, guaiFENesin-dextromethorphan, lidocaine-prilocaine, ondansetron (ZOFRAN) IV, ondansetron, oxyCODONE, prochlorperazine   Past Medical History  Diagnosis Date  . COPD (chronic obstructive pulmonary disease)   . Depression   . Chronic back pain     DDD, disc bulge, radiculopathy, spinal stenosis  . Hyperlipidemia   . Anxiety   . Bipolar disorder, unspecified   . Carotid artery calcification   .  DDD (degenerative disc disease), lumbosacral   . Spinal stenosis, lumbar   . Autoimmune thyroiditis   . Essential hypertension, benign   . TIA (transient ischemic attack)     August 2014  . GERD (gastroesophageal reflux disease)   . Neuropathic pain   . Addison's disease     On Prednisone  . History of pneumonia   . Achalasia   . Melanoma   . Frequent falls   . DM (diabetes mellitus) with complications   . 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 Hypothyroidism     Past Surgical History  Procedure Laterality  Date  . Appendectomy    . Cholecystectomy    . Lump left breast      Benign  . Abdominal hysterectomy      Tubal pregnancy  . Inguinal hernia repair    . Stomach surgery    . Ectopic pregnancy surgery    . Back surgery      x 5;1984;1989;;1999;2000;2010  . Colonoscopy  01/25/2012    Procedure: COLONOSCOPY;  Surgeon: Rogene Houston, MD;  Location: [facility];  Service: Endoscopy;  Laterality: N/A;  130  . Esophagogastroduodenoscopy (egd) with esophageal dilation N/A 08/13/2012    Procedure: ESOPHAGOGASTRODUODENOSCOPY (EGD) WITH ESOPHAGEAL DILATION;  Surgeon: Rogene Houston, MD;  Location: [facility];  Service: Endoscopy;  Laterality: N/A;  315  . Anterior cervical decomp/discectomy fusion N/A 10/21/2012    Procedure: Cervical Six-Seven Anterior cervical decompression/diskectomy/fusion;  Surgeon: Kristeen Miss, MD;  Location: [facility];  Service: Neurosurgery;  Laterality: N/A;  Cervical Six-Seven Anterior cervical decompression/diskectomy/fusion  . Cataract extraction w/phaco Right 11/25/2012    Procedure: CATARACT EXTRACTION PHACO AND INTRAOCULAR LENS PLACEMENT (IOC);  Surgeon: Elta Guadeloupe T. Gershon Crane, MD;  Location: [facility];  Service: Ophthalmology;  Laterality: Right;  CDE:10.06  . Cataract extraction w/phaco Left 12/09/2012    Procedure: CATARACT EXTRACTION PHACO AND INTRAOCULAR LENS PLACEMENT (IOC);  Surgeon: Elta Guadeloupe T. Gershon Crane, MD;  Location: [facility];  Service: Ophthalmology;  Laterality: Left;  CDE:5.06  . Tonsillectomy    . Hiatal hernia repair    . Heller myotomy N/A 03/17/2013    Procedure: DIAGNOSTIC LAPAROSCOPY, LAPAROSCOPIC HELLER MYOTOMY, ENDOSCOPY, DOR FUNDOPLICATION;  Surgeon: Ralene Ok, MD;  Location: [facility];  Service: General;  Laterality: N/A;  . Breast surgery      Left breast  . Eye surgery    . Video bronchoscopy with endobronchial ultrasound N/A 07/24/2013    Procedure: VIDEO BRONCHOSCOPY WITH ENDOBRONCHIAL ULTRASOUND;  Surgeon: [name], MD;  Location: [facility];   Service: Thoracic;  Laterality: N/A;    Family History  Problem Relation Age of Onset  . Hypertension Mother   . Heart disease Mother   . Heart attack Mother   . Anxiety disorder Mother   . Lung cancer Father   . Heart attack Father   . Alcohol abuse Father   . Hypertension Brother   . Alcohol abuse Brother   . Bipolar disorder Brother   . ADD / ADHD Brother   . Drug abuse Brother   . OCD Brother   . Alcohol abuse Sister   . Bipolar disorder Sister   . Anxiety disorder Sister   . ADD / ADHD Sister   . Drug abuse Sister   . Physical abuse Sister   . Sexual abuse Sister   . Alcohol abuse Brother   . Bipolar disorder Brother   . ADD / ADHD Brother   . Drug abuse Brother   .  Alcohol abuse Sister   . Bipolar disorder Sister   . Anxiety disorder Sister   . ADD / ADHD Sister   . Physical abuse Sister   . Sexual abuse Sister   . Alcohol abuse Brother   . Bipolar disorder Brother   . ADD / ADHD Brother   . Bipolar disorder Brother   . ADD / ADHD Brother   . Alcohol abuse Brother   . Paranoid behavior Brother   . Physical abuse Brother   . Sexual abuse Brother   . Bipolar disorder Maternal Aunt   . Bipolar disorder Paternal Aunt   . Bipolar disorder Maternal Uncle   . Bipolar disorder Paternal Uncle   . Bipolar disorder Maternal Grandfather   . Alcohol abuse Maternal Grandfather   . Bipolar disorder Maternal Grandmother   . Alcohol abuse Maternal Grandmother   . Anxiety disorder Maternal Grandmother   . Dementia Maternal Grandmother   . Bipolar disorder Paternal Grandfather   . Alcohol abuse Paternal Grandfather   . Bipolar disorder Paternal Grandmother   . Alcohol abuse Paternal Grandmother   . Drug abuse Paternal Grandmother   . Bipolar disorder Maternal Uncle   . Schizophrenia Neg Hx   . Seizures Neg Hx     Social History Ms. Brandl reports that she has been smoking Cigarettes and E-cigarettes.  She has a 10 pack-year smoking history. She has never used  smokeless tobacco. Ms. Gieske reports that she does not drink alcohol.  Review of Systems Weak, chronic shortness of breath without recent change. No fevers or chills. Fair appetite. No bleeding episodes. Other systems reviewed and negative.  Physical Examination Blood pressure 118/59, pulse 86, temperature 97.8 F (36.6 C), temperature source Oral, resp. rate 25, height 5\' 7"  (1.702 m), weight 194 lb (87.998 kg), SpO2 94.00%. No intake or output data in the 24 hours ending 08/20/13 1120  Chronically ill-appearing woman, looks older than stated age. HEENT: Conjunctiva and lids normal, oropharynx clear. Neck: Supple, no elevated JVP or carotid bruits, no thyromegaly. Lungs: Scattered rhonchi and increased expiratory phase, nonlabored breathing at rest. Cardiac: Regular rate and rhythm, S4 with soft systolic murmur, no pericardial rub. Abdomen: Soft, nontender, bowel sounds present. Extremities: Mild edema, distal pulses 2+. Skin: Warm and dry. Musculoskeletal: No kyphosis. Neuropsychiatric: Alert and oriented x3, affect grossly appropriate.   Lab Results  Basic Metabolic Panel:  Recent Labs Lab 08/20/13 0613  NA 138  K 4.8  CL 100  CO2 29  GLUCOSE 101*  BUN 16  CREATININE 0.88  CALCIUM 8.4    Liver Function Tests:  Recent Labs Lab 08/20/13 0613  AST 13  ALT 9  ALKPHOS 48  BILITOT <0.2*  PROT 5.9*  ALBUMIN 2.8*    CBC:  Recent Labs Lab 08/20/13 0613  WBC 8.6  NEUTROABS 6.8  HGB 11.2*  HCT 34.2*  MCV 90.2  PLT 230    Cardiac Enzymes:  Recent Labs Lab 08/20/13 0613 08/20/13 1004  TROPONINI 0.46* 0.47*    Pro-BNP: 1866   ECG Tracing from April of this year showed sinus rhythm with possible left atrial enlargement and nonspecific T-wave changes.  Imaging CHEST 2 VIEW  COMPARISON: PET CT scan 07/31/2013. Single view of the chest  07/24/2013. CT chest 07/14/2013.  FINDINGS:  Small amount of fluid or thickening in the minor fissure is  noted.  Right hilar adenopathy is seen as on the prior examinations. There  is some dependent atelectasis in the lung bases. No pneumothorax or  pleural effusion. Right upper lobe pulmonary nodule is identified.  Heart size is normal.  IMPRESSION:  No acute abnormality.  Right upper lobe pulmonary nodule and right hilar lymphadenopathy  are seen as on prior studies.  Echocardiogram (04/02/2013): Study Conclusions  - Study data: Technically adequate study. - Left ventricle: The cavity size was normal. Wall thickness was normal. Systolic function was normal. The estimated ejection fraction was in the range of 60% to 65%. Wall motion was normal; there were no regional wall motion abnormalities. Left ventricular diastolic function parameters were normal. - Aortic valve: Valve area: 3.17cm^2(VTI). Valve area: 2.79cm^2 (Vmax).    Impression  1. Mild troponin I elevation in the absence of chest pain or hemodynamic compromise. Patient has a number of active comorbidities including recent diagnosis of small cell lung cancer with recent chemotherapy and XRT treatment. No obvious arrhythmias noted. She has no known history of cardiomyopathy, last echocardiogram in April showed LVEF 60-65%. Chemotherapy agents include etoposide and cisplatin.  2. History of Addison's disease, on chronic steroids.  3. Small cell lung cancer, recently started on chemotherapy and XRT.  4. COPD.  5. Essential hypertension.  6. Presentation with facial swelling as outlined. Question correlation with recent chemotherapy treatments.   Recommendations  At this time would obtain an ECG and a followup echocardiogram. We will review these results. Doubt that further ischemic workup will be pursued at this point.  Satira Sark, M.D., F.A.C.C.

## 2013-08-20 NOTE — ED Notes (Signed)
CRITICAL VALUE ALERT  Critical value received:  Troponin 0.46  Date of notification:  8/20  Time of notification:  0653  Critical value read back:Yes.    Nurse who received alert:  t Johnte Portnoy rn  MD notified (1st page):  Rancour  Time of first page:    MD notified (2nd page):  Time of second page:  Responding MD:    Time MD responded:

## 2013-08-20 NOTE — ED Provider Notes (Signed)
CSN: [id number]     Arrival date & time 08/20/13  0455 History   None    Chief Complaint  Patient presents with  . Allergic Reaction     (Consider location/radiation/quality/duration/timing/severity/associated sxs/prior Treatment) HPI Comments: Patient states she awoke having "allergic reaction" with swelling of her face, hands, feet, eyes and wheezing. She stated she had her first infusion of chemotherapy yesterday as well as radiation for small cell lung cancer. Denies any problems during infusion. Denies any problems before going to bed. Denies any chest pain, cough or fever. she states she woke up and her eyes were swollen as well as swelling in her hands and feet. 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 She is on chronic prednisone 7.5 mg daily. She has a history of COPD, not on home oxygen. Denies any nausea, vomiting, abdominal pain or back pain. She called the cancer center and was told to come to the hospital for possible allergic reaction.  The history is provided by the EMS personnel and the patient.    Past Medical History  Diagnosis Date  . COPD (chronic obstructive pulmonary disease)   . Depression   . Chronic back pain     DDD, disc bulge, radiculopathy, spinal stenosis  . Hyperlipidemia   . Anxiety   . Bipolar disorder, unspecified   . Carotid artery calcification   . DDD (degenerative disc disease), lumbosacral   . Spinal stenosis, lumbar   . Autoimmune thyroiditis   . Essential hypertension, benign   . TIA (transient ischemic attack)     August 2014  . GERD (gastroesophageal reflux disease)   . Neuropathic pain   . Addison's disease     On Prednisone  . History of pneumonia   . Achalasia   . Melanoma   . Frequent falls   . DM (diabetes mellitus) with complications   . 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 Hypothyroidism    Past Surgical History  Procedure Laterality Date  . Appendectomy    . Cholecystectomy     . Lump left breast      Benign  . Abdominal hysterectomy      Tubal pregnancy  . Inguinal hernia repair    . Stomach surgery    . Ectopic pregnancy surgery    . Back surgery      x 5;1984;1989;;1999;2000;2010  . Colonoscopy  01/25/2012    Procedure: COLONOSCOPY;  Surgeon: Rogene Houston, MD;  Location: [facility];  Service: Endoscopy;  Laterality: N/A;  130  . Esophagogastroduodenoscopy (egd) with esophageal dilation N/A 08/13/2012    Procedure: ESOPHAGOGASTRODUODENOSCOPY (EGD) WITH ESOPHAGEAL DILATION;  Surgeon: Rogene Houston, MD;  Location: [facility];  Service: Endoscopy;  Laterality: N/A;  315  . Anterior cervical decomp/discectomy fusion N/A 10/21/2012    Procedure: Cervical Six-Seven Anterior cervical decompression/diskectomy/fusion;  Surgeon: Kristeen Miss, MD;  Location: [facility];  Service: Neurosurgery;  Laterality: N/A;  Cervical Six-Seven Anterior cervical decompression/diskectomy/fusion  . Cataract extraction w/phaco Right 11/25/2012    Procedure: CATARACT EXTRACTION PHACO AND INTRAOCULAR LENS PLACEMENT (IOC);  Surgeon: Elta Guadeloupe T. Gershon Crane, MD;  Location: [facility];  Service: Ophthalmology;  Laterality: Right;  CDE:10.06  . Cataract extraction w/phaco Left 12/09/2012    Procedure: CATARACT EXTRACTION PHACO AND INTRAOCULAR LENS PLACEMENT (IOC);  Surgeon: Elta Guadeloupe T. Gershon Crane, MD;  Location: [facility];  Service: Ophthalmology;  Laterality: Left;  CDE:5.06  . Tonsillectomy    . Hiatal hernia repair    .  Heller myotomy N/A 03/17/2013    Procedure: DIAGNOSTIC LAPAROSCOPY, LAPAROSCOPIC HELLER MYOTOMY, ENDOSCOPY, DOR FUNDOPLICATION;  Surgeon: Ralene Ok, MD;  Location: [facility];  Service: General;  Laterality: N/A;  . Breast surgery      Left breast  . Eye surgery    . Video bronchoscopy with endobronchial ultrasound N/A 07/24/2013    Procedure: VIDEO BRONCHOSCOPY WITH ENDOBRONCHIAL ULTRASOUND;  Surgeon: [name], MD;  Location: [facility];  Service: Thoracic;  Laterality: N/A;    Family History  Problem Relation Age of Onset  . Hypertension Mother   . Heart disease Mother   . Heart attack Mother   . Anxiety disorder Mother   . Lung cancer Father   . Heart attack Father   . Alcohol abuse Father   . Hypertension Brother   . Alcohol abuse Brother   . Bipolar disorder Brother   . ADD / ADHD Brother   . Drug abuse Brother   . OCD Brother   . Alcohol abuse Sister   . Bipolar disorder Sister   . Anxiety disorder Sister   . ADD / ADHD Sister   . Drug abuse Sister   . Physical abuse Sister   . Sexual abuse Sister   . Alcohol abuse Brother   . Bipolar disorder Brother   . ADD / ADHD Brother   . Drug abuse Brother   . Alcohol abuse Sister   . Bipolar disorder Sister   . Anxiety disorder Sister   . ADD / ADHD Sister   . Physical abuse Sister   . Sexual abuse Sister   . Alcohol abuse Brother   . Bipolar disorder Brother   . ADD / ADHD Brother   . Bipolar disorder Brother   . ADD / ADHD Brother   . Alcohol abuse Brother   . Paranoid behavior Brother   . Physical abuse Brother   . Sexual abuse Brother   . Bipolar disorder Maternal Aunt   . Bipolar disorder Paternal Aunt   . Bipolar disorder Maternal Uncle   . Bipolar disorder Paternal Uncle   . Bipolar disorder Maternal Grandfather   . Alcohol abuse Maternal Grandfather   . Bipolar disorder Maternal Grandmother   . Alcohol abuse Maternal Grandmother   . Anxiety disorder Maternal Grandmother   . Dementia Maternal Grandmother   . Bipolar disorder Paternal Grandfather   . Alcohol abuse Paternal Grandfather   . Bipolar disorder Paternal Grandmother   . Alcohol abuse Paternal Grandmother   . Drug abuse Paternal Grandmother   . Bipolar disorder Maternal Uncle   . Schizophrenia Neg Hx   . Seizures Neg Hx    History  Substance Use Topics  . Smoking status: Current Some Day Smoker -- 0.25 packs/day for 40 years    Types: Cigarettes, E-cigarettes  . Smokeless tobacco: Never Used     Comment: 5 cigs  day 03/12/13, 08/12/13 currently 1 ppd  . Alcohol Use: No   OB History   Grav Para Term Preterm Abortions TAB SAB Ect Mult Living   2 1 1  1   1  1      Review of Systems  Constitutional: Negative for fever, activity change and appetite change.  HENT: Negative for congestion.   Eyes: Positive for redness and itching. Negative for visual disturbance.  Respiratory: Positive for wheezing. Negative for shortness of breath.   Cardiovascular: Positive for leg swelling. Negative for chest pain.  Gastrointestinal: Negative for nausea, vomiting and abdominal pain.  Genitourinary: Negative  for dysuria and hematuria.  Musculoskeletal: Positive for joint swelling.  Skin: Positive for rash.  Neurological: Negative for dizziness, weakness and headaches.  A complete 10 system review of systems was obtained and all systems are negative except as noted in the HPI and PMH.      Allergies  Codeine; Cyclobenzaprine; Darvocet; Abilify; Ace inhibitors; Adhesive; Gabapentin; Iodine; Levofloxacin; Prednisone; Sulfa antibiotics; and Robaxin  Home Medications   Prior to Admission medications   Medication Sig Start Date End Date Taking? Authorizing Provider  albuterol (PROVENTIL HFA;VENTOLIN HFA) 108 (90 BASE) MCG/ACT inhaler Inhale 2 puffs into the lungs 3 (three) times daily as needed for wheezing or shortness of breath. 05/26/13  Yes Nena Polio, PA-C  albuterol (PROVENTIL) (2.5 MG/3ML) 0.083% nebulizer solution Take 3 mLs (2.5 mg total) by nebulization every 4 (four) hours as needed for wheezing. 05/26/13  Yes Nena Polio, PA-C  amitriptyline (ELAVIL) 100 MG tablet Take 1 tablet (100 mg total) by mouth at bedtime. 05/29/13  Yes Levonne Spiller, MD  baclofen (LIORESAL) 10 MG tablet Take 5 mg by mouth daily. 05/26/13  Yes Nena Polio, PA-C  clonazePAM (KLONOPIN) 1 MG tablet Take 1 tablet (1 mg total) by mouth 4 (four) times daily. 05/29/13  Yes Levonne Spiller, MD  divalproex (DEPAKOTE ER) 500 MG 24 hr tablet  Take 1 tablet (500 mg total) by mouth daily. 06/26/13  Yes Levonne Spiller, MD  donepezil (ARICEPT) 5 MG tablet Take 1 tablet (5 mg total) by mouth at bedtime. 06/18/13  Yes Alycia Rossetti, MD  DULoxetine (CYMBALTA) 60 MG capsule Take 1 capsule (60 mg total) by mouth every morning. 05/29/13  Yes Levonne Spiller, MD  fentaNYL (DURAGESIC) 25 MCG/HR patch Place 1 patch (25 mcg total) onto the skin every 3 (three) days. 08/11/13  Yes Alycia Rossetti, MD  levothyroxine (SYNTHROID, LEVOTHROID) 25 MCG tablet Take 25 mcg by mouth daily before breakfast.   Yes Historical Provider, MD  lidocaine (LIDODERM) 5 % APPLY ONE PATCH TO SKIN EVERY 12 HOURS. 12 HOURS OFF. 08/17/13  Yes Alycia Rossetti, MD  lidocaine-prilocaine (EMLA) cream Apply 1 application topically as needed. Apply to port 1 hr before chemo 08/18/13  Yes Curt Bears, MD  metoprolol tartrate (LOPRESSOR) 25 MG tablet Take 12.5 mg by mouth 2 (two) times daily with a meal. 05/26/13  Yes Nena Polio, PA-C  Multiple Vitamins-Minerals (HAIR/SKIN/NAILS/BIOTIN PO) Take 1 tablet by mouth daily.   Yes Historical Provider, MD  pantoprazole (PROTONIX) 40 MG tablet Take 1 tablet (40 mg total) by mouth daily. 07/10/13  Yes Alycia Rossetti, MD  pravastatin (PRAVACHOL) 40 MG tablet Take 1 tablet (40 mg total) by mouth every evening. 06/25/13  Yes Alycia Rossetti, MD  predniSONE (DELTASONE) 2.5 MG tablet Take 3 tablets (7.5 mg total) by mouth daily with breakfast. 05/26/13  Yes Nena Polio, PA-C  prochlorperazine (COMPAZINE) 10 MG tablet Take 1 tablet (10 mg total) by mouth every 6 (six) hours as needed for nausea or vomiting. 08/06/13  Yes Curt Bears, MD  tiotropium (SPIRIVA) 18 MCG inhalation capsule Place 1 capsule (18 mcg total) into inhaler and inhale daily. 07/10/13  Yes Alycia Rossetti, MD   BP 118/59  Pulse 86  Temp(Src) 97.8 F (36.6 C) (Oral)  Resp 25  Ht 5\' 7"  (1.702 m)  Wt 194 lb (87.998 kg)  BMI 30.38 kg/m2  SpO2 93% Physical Exam  Nursing  note and vitals reviewed. Constitutional: She is oriented to person, place, and time. She appears  well-developed and well-nourished. No distress.  Erythematous cheeks, moon facies  HENT:  Head: Normocephalic and atraumatic.  Mouth/Throat: Oropharynx is clear and moist. No oropharyngeal exudate.  OP clear, no assymetry No tongue or lip swelling  Eyes: Conjunctivae and EOM are normal. Pupils are equal, round, and reactive to light.  Minimal periorbital edema  Neck: Normal range of motion. Neck supple.  No meningismus.  Cardiovascular: Normal rate, regular rhythm, normal heart sounds and intact distal pulses.   No murmur heard. Pulmonary/Chest: Effort normal. No respiratory distress. She has wheezes.  Scattered expiratory wheezing  Abdominal: Soft. There is no tenderness. There is no rebound and no guarding.  Musculoskeletal: Normal range of motion. She exhibits edema. She exhibits no tenderness.  Trace pedal edema and edema to dorsal hands  Neurological: She is alert and oriented to person, place, and time. No cranial nerve deficit. She exhibits normal muscle tone. Coordination normal.  No ataxia on finger to nose bilaterally. No pronator drift. 5/5 strength throughout. CN 2-12 intact. Negative Romberg. Equal grip strength. Sensation intact. Gait is normal.   Skin: Skin is warm.  Psychiatric: She has a normal mood and affect. Her behavior is normal.    ED Course  Procedures (including critical care time) Labs Review Labs Reviewed  CBC WITH DIFFERENTIAL - Abnormal; Notable for the following:    RBC 3.79 (*)    Hemoglobin 11.2 (*)    HCT 34.2 (*)    Neutrophils Relative % 79 (*)    All other components within normal limits  COMPREHENSIVE METABOLIC PANEL - Abnormal; Notable for the following:    Glucose, Bld 101 (*)    Total Protein 5.9 (*)    Albumin 2.8 (*)    Total Bilirubin <0.2 (*)    GFR calc non Af Amer 71 (*)    GFR calc Af Amer 82 (*)    All other components within  normal limits  TROPONIN I - Abnormal; Notable for the following:    Troponin I 0.46 (*)    All other components within normal limits  PRO B NATRIURETIC PEPTIDE - Abnormal; Notable for the following:    Pro B Natriuretic peptide (BNP) 1866.0 (*)    All other components within normal limits  TSH - Abnormal; Notable for the following:    TSH 0.107 (*)    All other components within normal limits  TROPONIN I - Abnormal; Notable for the following:    Troponin I 0.47 (*)    All other components within normal limits  MRSA PCR SCREENING  TROPONIN I  TROPONIN I  HEMOGLOBIN A1C    Imaging Review Dg Chest 2 View  08/20/2013   CLINICAL DATA:  Facial swelling.  Allergic reaction.  EXAM: CHEST  2 VIEW  COMPARISON:  PET CT scan 07/31/2013. Single view of the chest 07/24/2013. CT chest 07/14/2013.  FINDINGS: Small amount of fluid or thickening in the minor fissure is noted. Right hilar adenopathy is seen as on the prior examinations. There is some dependent atelectasis in the lung bases. No pneumothorax or pleural effusion. Right upper lobe pulmonary nodule is identified. Heart size is normal.  IMPRESSION: No acute abnormality.  Right upper lobe pulmonary nodule and right hilar lymphadenopathy are seen as on prior studies.   Electronically Signed   By: Inge Rise M.D.   On: 08/20/2013 07:21      EKG Interpretation None      MDM   Final diagnoses:  Elevated troponin  Chronic obstructive pulmonary disease  with acute exacerbation  Allergic reaction, initial encounter   swelling to face, hands and feet with history of Addison's disease and chronic steroid use. Recently started on chemotherapy and radiation. Denies any chest pain or shortness of breath. Patient is concerned for allergic reaction which is possible. She is on chronic steroids.  Will give antihistamines.  CXR clear. EKG with nonspecific ST changes.  troponin elevation of 0.46. Patient denies any chest pain. EKG with  nonspecific ST changes. Review of previous hospitalization April shows chronically elevated troponins. At that time cardiology saw patient and stated "likely related to decreased coronary perfusion pressures in the setting of systemic hypotension and hypovolemia, as well as increased demand in the setting of multiple metabolic derangements"  D/w Dr. Mare Ferrari.  He recommends observation admission and cardiology evaluation at [facility].  Continue nebs. Admission dw Dr. Caryn Section for cardiology evaluation.  Echo in April showed preserved EF without WMA.    Date: 08/20/2013  Rate: 74  Rhythm: normal sinus rhythm  QRS Axis: normal  Intervals: normal  ST/T Wave abnormalities: normal  Conduction Disutrbances:none  Narrative Interpretation:   Old EKG Reviewed: unchanged    Ezequiel Essex, MD 08/20/13 1457

## 2013-08-20 NOTE — ED Notes (Signed)
Pt to department via EMS.  Pt awoke having allergic reaction.  Pt woke and having some wheezing, swelling of face, and rash.  Pt did have chemo and radiation recently.

## 2013-08-20 NOTE — Telephone Encounter (Signed)
Called and stated she is admitted to hospital for "cardiac problem".

## 2013-08-21 ENCOUNTER — Ambulatory Visit: Payer: Self-pay

## 2013-08-21 ENCOUNTER — Telehealth: Payer: Self-pay | Admitting: *Deleted

## 2013-08-21 ENCOUNTER — Ambulatory Visit: Payer: Medicare Other

## 2013-08-21 DIAGNOSIS — I517 Cardiomegaly: Secondary | ICD-10-CM

## 2013-08-21 LAB — CBC
HCT: 34.5 % — ABNORMAL LOW (ref 36.0–46.0)
Hemoglobin: 10.9 g/dL — ABNORMAL LOW (ref 12.0–15.0)
MCH: 28.7 pg (ref 26.0–34.0)
MCHC: 31.6 g/dL (ref 30.0–36.0)
MCV: 90.8 fL (ref 78.0–100.0)
Platelets: 226 10*3/uL (ref 150–400)
RBC: 3.8 MIL/uL — AB (ref 3.87–5.11)
RDW: 14.7 % (ref 11.5–15.5)
WBC: 6.4 10*3/uL (ref 4.0–10.5)

## 2013-08-21 LAB — T4, FREE: Free T4: 0.82 ng/dL (ref 0.80–1.80)

## 2013-08-21 LAB — BASIC METABOLIC PANEL
Anion gap: 10 (ref 5–15)
BUN: 13 mg/dL (ref 6–23)
CALCIUM: 8.6 mg/dL (ref 8.4–10.5)
CO2: 31 meq/L (ref 19–32)
Chloride: 99 mEq/L (ref 96–112)
Creatinine, Ser: 0.81 mg/dL (ref 0.50–1.10)
GFR calc Af Amer: >90 mL/min (ref >90)
GFR calc non Af Amer: 79 mL/min — ABNORMAL LOW (ref >90)
GLUCOSE: 123 mg/dL — AB (ref 70–99)
Potassium: 5 mEq/L (ref 3.7–5.3)
SODIUM: 140 meq/L (ref 137–147)

## 2013-08-21 MED ORDER — ALBUTEROL SULFATE (2.5 MG/3ML) 0.083% IN NEBU: 2.5000 mg | INHALATION_SOLUTION | RESPIRATORY_TRACT | Status: DC

## 2013-08-21 MED ORDER — DIPHENHYDRAMINE HCL 25 MG PO TABS: 25.0000 mg | ORAL_TABLET | Freq: Four times a day (QID) | ORAL | Status: DC | PRN

## 2013-08-21 MED ORDER — EPINEPHRINE 0.3 MG/0.3ML IJ SOAJ: 0.3000 mg | Freq: Once | INTRAMUSCULAR | Status: AC

## 2013-08-21 NOTE — Evaluation (Signed)
Physical Therapy Evaluation Patient Details Name: Tonya Sullivan MRN: [id number] DOB: 15-Sep-1955 Today's Date: 08/21/2013   History of Present Illness  Tonya Sullivan is a 58 y.o. female with a recent diagnosis of small cell carcinoma of the lung, hypothyroidism, COPD, and major depression/anxiety, who presents to the emergency department  with complaint of facial swelling. The patient states that yesterday morning, she had mild swelling of her face and particularly around both eyes. The swelling subsided. The swelling returned this morning at approximately 3 AM. The area around both eyes were significantly swollen with some puffiness over her cheeks. The area around her right eye was more swollen than left eye. She denies difficulty seeing or photophobia. She denies associated headache. She also developed generalized swelling in her arms and her legs. She also felt as if there was some swelling in her throat. She had no difficulty swallowing or breathing. She had no chest pain or shortness of breath or pleurisy or palpitations, but she did have some increase in her wheezing. She denies a rash. Her symptoms started the day following her first round of chemotherapy and radiation therapy. The only other change in her general regimen was that she added peppermint oil to her coffee yesterday morning. She does not know if the peppermint oil caused her symptoms, but she believes her symptoms were associated with chemotherapy and radiation therapy. Currently, the swelling has subsided and she has no complaints. Specifically no complaints of swallowing, no complaints of shortness of breath, and no complaints of chest pain.  Clinical Impression  Pt is a 58 year old female who presents to physical therapy with dx of facial swelling.  Pt has a chronic hx of LBP, with 5 back surgeries and 1 neck surgery.  Pt reports increasing use of rollator to 75% of the day due to low activity tolerance and balance.  During  evaluation, pt was mod (I) with bed mobility skills, transfers, and min guard and use of RW for gait.  No overt LOB, though due to hx of chronic back pain/surgeries and balance issues pt may be a fall risk.  Pt is at currently baseline level of function and will be discharged from acute PT services, though pt will benefit from HHPT services to address activity tolerance, strengthening, and balance for functional mobility skills.     Follow Up Recommendations Home health PT    Equipment Recommendations  None recommended by PT       Precautions / Restrictions Precautions Precautions: Fall Restrictions Weight Bearing Restrictions: No      Mobility  Bed Mobility Overal bed mobility: Independent                Transfers Overall transfer level: Modified independent Equipment used: Rolling walker (2 wheeled)                Ambulation/Gait Ambulation/Gait assistance: Min guard Ambulation Distance (Feet): 25 Feet Assistive device: Rolling walker (2 wheeled) Gait Pattern/deviations: Step-through pattern;Decreased step length - right;Decreased step length - left;Decreased dorsiflexion - right;Decreased dorsiflexion - left   Gait velocity interpretation: Below normal speed for age/gender       Balance Overall balance assessment: Needs assistance Sitting-balance support: Feet supported;No upper extremity supported Sitting balance-Leahy Scale: Good     Standing balance support: Bilateral upper extremity supported;During functional activity Standing balance-Leahy Scale: Fair  Pertinent Vitals/Pain Pain Assessment: 0-10 Pain Score: 7  Pain Location: Chronic LBP Pain Descriptors / Indicators: Throbbing Pain Intervention(s): Limited activity within patient's tolerance    Home Living Family/patient expects to be discharged to:: Private residence Living Arrangements: Alone Available Help at Discharge: Family;Neighbor;Available  PRN/intermittently;Personal care attendant (Pt has an aide 2 hours a day/7 days a week) Type of Home: Apartment Home Access: Stairs to enter Entrance Stairs-Rails: Right Entrance Stairs-Number of Steps: 6 Home Layout: One level Home Equipment: Grab bars - tub/shower;Shower seat;Toilet riser;Cane - single point Agricultural consultant) Additional Comments: Tub shower    Prior Function Level of Independence: Needs assistance   Gait / Transfers Assistance Needed: Pt reports mod (I) with bed mobility skills, transfers, and household amb with use of rollator.   ADL's / Homemaking Assistance Needed: Aide assist with bathing, shopping, cooking, cleaning activities           Extremity/Trunk Assessment               Lower Extremity Assessment: Generalized weakness         Communication   Communication: No difficulties  Cognition Arousal/Alertness: Awake/alert Behavior During Therapy: WFL for tasks assessed/performed Overall Cognitive Status: Within Functional Limits for tasks assessed                               Assessment/Plan    PT Assessment All further PT needs can be met in the next venue of care  PT Diagnosis Difficulty walking;Generalized weakness   PT Problem List Decreased strength;Decreased activity tolerance;Decreased balance;Decreased mobility  PT Treatment Interventions     PT Goals (Current goals can be found in the Care Plan section) Acute Rehab PT Goals PT Goal Formulation: No goals set, d/c therapy         End of Session Equipment Utilized During Treatment: Gait belt Activity Tolerance: Patient tolerated treatment well Patient left: in bed           Time: 1133-1143 PT Time Calculation (min): 10 min   Charges:   PT Evaluation $Initial PT Evaluation Tier I: 1 Procedure     Tonya Sullivan 08/21/2013, 12:20 PM

## 2013-08-21 NOTE — Telephone Encounter (Signed)
Called and spoke to pt, she is getting d/c from the hospital today.  She denies any nausea or vomiting.  Has antiemetics at home.  Pt knows to call if anything changes.  SLJ

## 2013-08-21 NOTE — Progress Notes (Signed)
Helped patient complete her Advance Directives, placed a copy in her chart and gave her copies/original.

## 2013-08-21 NOTE — Progress Notes (Signed)
Patient: Tonya Sullivan / Admit Date: 08/20/2013 / Date of Encounter: 08/21/2013, 8:30 AM  Primary cardiologist: Dr. Carlyle Dolly  Subjective: Feeling better. Swelling is subsiding. Still no CP or SOB.   Objective: Telemetry: NSR Physical Exam: Blood pressure 169/83, pulse 67, temperature 98.2 F (36.8 C), temperature source Oral, resp. rate 14, height 5\' 7"  (1.702 m), weight 203 lb 0.7 oz (92.1 kg), SpO2 96.00%. General: Well developed WF in no acute distress. Head: Normocephalic, atraumatic, sclera non-icteric, no xanthomas, nares are without discharge. Neck: JVP not elevated. Lungs: Diminished BS throughout without wheezing, rales or rhonchi. Breathing is unlabored. Heart: RRR S1 S2 without murmurs, rubs, or gallops.  Abdomen: Soft, non-tender, non-distended with normoactive bowel sounds. No rebound/guarding. Extremities: No clubbing or cyanosis. No edema. Distal pedal pulses are 2+ and equal bilaterally. Neuro: Alert and oriented X 3. Moves all extremities spontaneously. Psych:  Responds to questions appropriately with a normal affect.   Intake/Output Summary (Last 24 hours) at 08/21/13 0830 Last data filed at 08/21/13 0600  Gross per 24 hour  Intake    950 ml  Output   1450 ml  Net   -500 ml    Inpatient Medications:  . albuterol  2.5 mg Nebulization Q4H  . amitriptyline  100 mg Oral QHS  . baclofen  5 mg Oral Daily  . clonazePAM  1 mg Oral QID  . diphenhydrAMINE  12.5 mg Oral 4 times per day  . divalproex  500 mg Oral Daily  . docusate sodium  100 mg Oral BID  . donepezil  5 mg Oral QHS  . DULoxetine  60 mg Oral q morning - 10a  . famotidine (PEPCID) IV  20 mg Intravenous Q12H  . fentaNYL  25 mcg Transdermal Q72H  . heparin  5,000 Units Subcutaneous 3 times per day  . levothyroxine  25 mcg Oral QAC breakfast  . lidocaine  1 patch Transdermal Q24H  . metoprolol tartrate  12.5 mg Oral BID WC  . pantoprazole  40 mg Oral Daily  . predniSONE  7.5 mg Oral Q  breakfast  . simvastatin  20 mg Oral q1800  . tiotropium  18 mcg Inhalation Daily   Infusions:  . 0.9 % NaCl with KCl 20 mEq / L 50 mL/hr at 08/21/13 0102    Labs:  Recent Labs  08/20/13 0613 08/21/13 0441  NA 138 140  K 4.8 5.0  CL 100 99  CO2 29 31  GLUCOSE 101* 123*  BUN 16 13  CREATININE 0.88 0.81  CALCIUM 8.4 8.6    Recent Labs  08/20/13 0613  AST 13  ALT 9  ALKPHOS 48  BILITOT <0.2*  PROT 5.9*  ALBUMIN 2.8*    Recent Labs  08/20/13 0613 08/21/13 0441  WBC 8.6 6.4  NEUTROABS 6.8  --   HGB 11.2* 10.9*  HCT 34.2* 34.5*  MCV 90.2 90.8  PLT 230 226    Recent Labs  08/20/13 0613 08/20/13 1004 08/20/13 1544 08/20/13 2155  TROPONINI 0.46* 0.47* 0.48* 0.47*     Recent Labs  08/20/13 1004  HGBA1C 5.9*     Radiology/Studies:  Ct Angio Chest Pe W/cm &/or Wo Cm  08/20/2013   CLINICAL DATA:  Periorbital edema, lung cancer.  EXAM: CT ANGIOGRAPHY CHEST WITH CONTRAST  TECHNIQUE: Multidetector CT imaging of the chest was performed using the standard protocol during bolus administration of intravenous contrast. Multiplanar CT image reconstructions and MIPs were obtained to evaluate the vascular anatomy.  CONTRAST:  167mL  OMNIPAQUE IOHEXOL 350 MG/ML SOLN  COMPARISON:  CT scan of July 14, 2013.  FINDINGS: No pneumothorax or pleural effusion is noted. 11 mm nodule is noted in right upper lobe which is stable compared to prior exam. 40 x 35 mm right hilar mass is again noted consistent with malignancy. There does not appear to be significant compression or obstruction of the superior vena cava. There is no evidence of pulmonary embolus. Thoracic aorta appears normal without evidence of aneurysm or dissection. Visualized portion of upper abdomen appears normal. Mild subsegmental atelectasis of right middle lobe is noted.  Review of the MIP images confirms the above findings.  IMPRESSION: 4 cm right hilar mass is again noted with associated 11 mm nodule in right upper  lobe consistent with metastatic disease. There does not appear to be significant compression of the superior vena cava by this mass. There is no evidence of pulmonary embolus. Mild subsegmental atelectasis of right middle lobe is noted.   Electronically Signed   By: Sabino Dick M.D.   On: 08/20/2013 16:45   EKG NSR 69bpm no acute ST-T changes   Assessment and Plan  1. Mild chronic-appearing troponin I elevation in the absence of chest pain or hemodynamic compromise. Patient has a number of active comorbidities including recent diagnosis of small cell lung cancer with recent chemotherapy and XRT treatment. No obvious arrhythmias noted. EKG is unremarkable. She has no known history of cardiomyopathy, last echocardiogram in April showed LVEF 60-65%. Chemotherapy agents include etoposide and cisplatin. Chest CTA shows no evidence of pulmonary embolus. Recent PET showed some coronary atherosclerosis - will discuss addition of baby aspirin with MD. She is on BB/statin already. 2D echo pending. Per Dr. Domenic Polite, doubt that further ischemic workup will be pursued at this point.  2. History of Addison's disease, on chronic steroids.   3. Small cell lung cancer, recently started on chemotherapy and XRT - CTA with known right hilar mass, also with associated nodule ? metastatic disease - per oncology.  4. COPD  5. Essential hypertension - BPs variable. Consider addition of amlodipine. Pt reports recent wheezing which is why we might want to hold BB where it is.  6. Presentation with facial swelling as outlined, question correlation with recent chemotherapy treatments. CTA did not demonstrate significant compression of SVC; no PE.  Signed, Melina Copa PA-C   Attending note:  Patient seen and examined. Agree with above assessment by Ms. Dunn PA-C. No chest pain reported, troponin I levels are low-level abnormal in a flat pattern, and not consistent with ACS. CT angiogram of the chest shows no evidence of  SVC compression, no pulmonary embolus either. Echocardiogram is pending for reassessment of LVEF. If no significant change, no further cardiac workup will be undertaken. As noted above, probably reasonable to consider low-dose aspirin since she does have some evidence of coronary atherosclerosis by noninvasive imaging.  Satira Sark, M.D., F.A.C.C.

## 2013-08-21 NOTE — Progress Notes (Signed)
Pt is to be discharged home today. Pt is in NAD, IV is out, all paperwork has been reviewed/discussed with patient, and there are no questions/concerns at this time. Assessment is unchanged from this morning. Faxed face-to-face and orders to [facility]. Pt is to be accompanied downstairs by staff and family via wheelchaie.

## 2013-08-21 NOTE — Progress Notes (Signed)
Patient will be at [facility] at least another 24 hours as she is still under observation and continued testing.Should be able to start treatment on Monday if discharged this week-end.[place] RT informed on linac 2.

## 2013-08-21 NOTE — Telephone Encounter (Signed)
Pt d/c from the hospital today.  Pt received day 1 and 2 of cycle 1 of cddp/ vp16.  Pt due to neulasta today but insurance will not cover her to come in the same day as a hospital d/c.  Per Dr Vista Mink, pt can either come for neulasta shot on 8/22 or we can skip it this cycle.  Pt states she would like to skip the injection this cycle.  Advised she call if she does develop fever 100.5 or greater.  Pt also needs lab and f/u with AJ next week.  Pt is aware schedulers will call her.

## 2013-08-21 NOTE — Telephone Encounter (Signed)
Message copied by Britt Bottom on Fri Aug 21, 2013 12:25 PM ------      Message from: Aura Fey A      Created: Tue Aug 18, 2013  3:09 PM      Regarding: 1st time chemo       1st time Cisplatin and Etoposide. Pt. of Dr. Julien Nordmann.  [phone] ------

## 2013-08-21 NOTE — Care Management Note (Signed)
    Page 1 of 1   08/21/2013     3:31:53 PM CARE MANAGEMENT NOTE 08/21/2013  Patient:  Tonya Sullivan, Tonya Sullivan   Account Number:  [id number]  Date Initiated:  08/21/2013  Documentation initiated by:  Theophilus Kinds  Subjective/Objective Assessment:   Pt admitted from home with allergic reaction. Pt lives alone and will return home at discharge. Pt has neb machine, cane, and walker for home use.     Action/Plan:   PT recommends HH PT. Pt chooses [facility]. Romualdo Bolk of [facility] is aware and will collect the pts information from the chart. [place] services to start within 48 hours of d/c. Pt and pts RN aware and will fax orders once written.   Anticipated DC Date:  08/21/2013   Anticipated DC Plan:  [place]  CM consult      [facility] Choice  HOME HEALTH   Choice offered to / List presented to:  C-1 Patient        [place] arranged  [place] [facility]      [place].   Status of service:  Completed, signed off Medicare Important Message given?  YES (If response is "NO", the following Medicare IM given date fields will be blank) Date Medicare IM given:  08/21/2013 Medicare IM given by:  Theophilus Kinds Date Additional Medicare IM given:   Additional Medicare IM given by:    Discharge Disposition:  [place]  Per UR Regulation:    If discussed at Long Length of Stay Meetings, dates discussed:    Comments:  08/21/13 [place], RN BSN CM

## 2013-08-21 NOTE — Progress Notes (Signed)
Subjective: Patient in bed.  Awake.  She denies any complaints this AM.  I personally reviewed and went over laboratory results with the patient.  The results are noted within this dictation.  I personally reviewed and went over radiographic studies with the patient.  The results are noted within this dictation.  CT angio of chest demonstrates:  4 cm right hilar mass is again noted with associated 11 mm nodule in  right upper lobe consistent with metastatic disease. There does not  appear to be significant compression of the superior vena cava by  this mass. There is no evidence of pulmonary embolus. Mild  subsegmental atelectasis of right middle lobe is noted.   Objective: Vital signs in last 24 hours: Temp:  [97.6 F (36.4 C)-98.2 F (36.8 C)] 98.2 F (36.8 C) (08/21 0800) Pulse Rate:  [62-100] 65 (08/21 1000) Resp:  [12-25] 14 (08/21 1000) BP: (93-169)/(50-95) 157/91 mmHg (08/21 1000) SpO2:  [80 %-99 %] 95 % (08/21 1000) Weight:  [203 lb 0.7 oz (92.1 kg)] 203 lb 0.7 oz (92.1 kg) (08/21 0500)  Intake/Output from previous day: 08/20 0800 - 08/21 0759 In: 1000 [I.V.:1000] Out: 1450 [Urine:1450] Intake/Output this shift: Total I/O In: 250 [I.V.:150; IV Piggyback:100] Out: 700 [Urine:700]  General appearance: alert, cooperative, appears older than stated age, no distress and syndromic facies  Lab Results:   Recent Labs  08/20/13 0613 08/21/13 0441  WBC 8.6 6.4  HGB 11.2* 10.9*  HCT 34.2* 34.5*  PLT 230 226   BMET  Recent Labs  08/20/13 0613 08/21/13 0441  NA 138 140  K 4.8 5.0  CL 100 99  CO2 29 31  GLUCOSE 101* 123*  BUN 16 13  CREATININE 0.88 0.81  CALCIUM 8.4 8.6    Studies/Results: Dg Chest 2 View  08/20/2013   CLINICAL DATA:  Facial swelling.  Allergic reaction.  EXAM: CHEST  2 VIEW  COMPARISON:  PET CT scan 07/31/2013. Single view of the chest 07/24/2013. CT chest 07/14/2013.  FINDINGS: Small amount of fluid or thickening in the minor fissure is  noted. Right hilar adenopathy is seen as on the prior examinations. There is some dependent atelectasis in the lung bases. No pneumothorax or pleural effusion. Right upper lobe pulmonary nodule is identified. Heart size is normal.  IMPRESSION: No acute abnormality.  Right upper lobe pulmonary nodule and right hilar lymphadenopathy are seen as on prior studies.   Electronically Signed   By: Inge Rise M.D.   On: 08/20/2013 07:21   Ct Angio Chest Pe W/cm &/or Wo Cm  08/20/2013   CLINICAL DATA:  Periorbital edema, lung cancer.  EXAM: CT ANGIOGRAPHY CHEST WITH CONTRAST  TECHNIQUE: Multidetector CT imaging of the chest was performed using the standard protocol during bolus administration of intravenous contrast. Multiplanar CT image reconstructions and MIPs were obtained to evaluate the vascular anatomy.  CONTRAST:  168mL OMNIPAQUE IOHEXOL 350 MG/ML SOLN  COMPARISON:  CT scan of July 14, 2013.  FINDINGS: No pneumothorax or pleural effusion is noted. 11 mm nodule is noted in right upper lobe which is stable compared to prior exam. 40 x 35 mm right hilar mass is again noted consistent with malignancy. There does not appear to be significant compression or obstruction of the superior vena cava. There is no evidence of pulmonary embolus. Thoracic aorta appears normal without evidence of aneurysm or dissection. Visualized portion of upper abdomen appears normal. Mild subsegmental atelectasis of right middle lobe is noted.  Review of the MIP images  confirms the above findings.  IMPRESSION: 4 cm right hilar mass is again noted with associated 11 mm nodule in right upper lobe consistent with metastatic disease. There does not appear to be significant compression of the superior vena cava by this mass. There is no evidence of pulmonary embolus. Mild subsegmental atelectasis of right middle lobe is noted.   Electronically Signed   By: Sabino Dick M.D.   On: 08/20/2013 16:45    Medications: I have reviewed the  patient's current medications.  Assessment/Plan: 1. Limited stage small cell lung cancer, presently day 4 of cycle 1 (missing planned day 3 and day 4 of chemotherapy due to hospitalization) of Cisplatin/Etoposide regimen. Radiation simulation complete, but not yet started radiation (planned to start on 8/20 per patient appointment list, but missed due to present hospitalization).  Discourage use of Neulasta during the time that combined modality treatment will be used as Neulasta-induced bone marrow stimulation during radiation may induce a paradoxical drop in neutrophils as circulating stem cells will be irradiated. 2. Periorbital edema, facial swelling, upper extremity swelling, erythematous rash, resolved S/P anaphylactic treatment. Differential includes anaphylactic reaction to chemotherapy, versus reaction to "peppermint oil in coffee," SVC thrombus is ruled out with CT angio of chest on 8/20. 3. ?Upper body vascular congestion due to tumor-induced compression (significant) of the superior vena cava.  Should improve/resolve rapidly with treatment as small cell lung cancer is typically very responsive to treatment. 4. Recommend discharge in the near future 5. Recommend follow-up with primary treating oncologist, Dr. Julien Nordmann, to determine future treatment plan given questionable anaphylactoid reaction following chemotherapy administration. 6. Hem/Onc will sign off at this time.  Patient and plan discussed with Dr. Farrel Gobble and he is in agreement with the aforementioned.     LOS: 1 day    KEFALAS,THOMAS 08/21/2013

## 2013-08-21 NOTE — Discharge Summary (Signed)
Physician Discharge Summary  Tonya Sullivan IPJ:[id number] DOB: 1955/10/15 DOA: 08/20/2013  PCP: Vic Blackbird, MD  Admit date: 08/20/2013 Discharge date: 08/21/2013  Time spent: Greater than 30 minutes  Recommendations for Outpatient Follow-up:  1. The patient was this occurs from ingesting peppermint oil. A consideration can be made for changing chemotherapeutic agent if the patient develops recurrent allergic reaction.  2. Recommend continued monitoring of her thyroid function (TSH low at 0.107 and free T4 low normal at 0.82). 3. Home health physical therapy was ordered.   Discharge Diagnoses:  1. Facial swelling/bilateral periorbital swelling and wheezing, secondary to an allergic reaction to chemotherapy or ingestion of peppermint oil. 2. Chronically elevated troponin I. (Range 0.46-0.48) Per cardiology, low level flat pattern elevated troponin I was not consistent with ACS. 2-D echocardiogram revealed ejection fraction of 60-65% and no left ventricular regional wall motion abnormalities. 3. Small cell carcinoma of the lung, undergoing chemotherapy and radiation therapy. 4. Adrenal cortical insufficiency/Addison's disease, on chronic prednisone. 5. COPD with mild exacerbation. 6. Tobacco abuse. The patient was advised to stop smoking. 7. Hypothyroidism. The patient's TSH was low at 0.107, but her free T4 was within normal limits. No changes made in her medication regimen. 8. Chronic pain syndrome, on chronic transdermal fentanyl. 9. Hypertension. Well controlled on metoprolol. 10. Normocytic anemia, likely secondary to chronic disease. 11. Morbid obesity. 12. Depression. Remained stable. 13. Hyperglycemia, possibly steroid induced. Hemoglobin A1c was 5.9.   Discharge Condition: Improved  Diet recommendation: Heart healthy  Filed Weights   08/20/13 0457 08/21/13 0500  Weight: 87.998 kg (194 lb) 92.1 kg (203 lb 0.7 oz)    History of present illness:  The patient is a  58 year old woman with a recent diagnosis of small cell carcinoma of the lung, COPD, and major depression. She presented to the emergency department on 08/19/2013 with a chief complaint of swelling of her face, particularly around her eyes and increase in wheezing. He stated that her symptoms started after the first round of chemotherapy and radiation therapy. She also reported adding peppermint oil to her coffee for the first time. Nevertheless, she felt as if she had an allergic reaction to either the chemotherapy, radiation therapy, or the peppermint oil. In the ED, she was afebrile and hemodynamically stable. Her chest x-ray revealed right upper lobe pulmonary nodule and right hilar lymphadenopathy as seen on prior studies, but no acute abnormalities. Her EKG revealed normal sinus rhythm with nonspecific T-wave abnormalities and a heart rate of 74 beats per minute. Her lab data were significant for a troponin I. of 0.46, total CK of 1866, and hemoglobin of 11.2. She was admitted for further evaluation and management.   Hospital Course:   1. Facial swelling/periorbital edema, possibly secondary to an allergic reaction. The patient believed that she had "an allergic reaction" to either the chemotherapy or the radiation therapy or both initially. She also reported trying peppermint oil added to her coffee a couple days ago. She did not initially believe that she could have had an allergic reaction, but later admitted that it could have been from the peppermint oil. Nevertheless, she was instructed not to use the peppermint oil again. She received Solu-Medrol, Pepcid, and Benadryl in the ED. she was continued on this regimen for 24 hours. Of note, the cause of generalized swelling in her arms and her legs, oncology ordered a CT angiogram of her chest to rule out SVC compression or thrombus. It was negative. Her symptoms completely resolved. She was instructed  to take Benadryl as needed if her symptoms returned.  She was also given a prescription for an EpiPen to use as needed for life-threatening allergic reaction/anaphylaxis. 2. Elevated troponin I. Per chart review, her troponin I was elevated in April 2015, but the exact etiology was not clearly elucidated, but may have been secondary to demand ischemia. Her troponin I was cycled during this hospitalization and range from 0.46-0.48. Her EKG revealed no suspicious ST or T wave abnormalities. Cardiology was consulted and ordered a 2-D echocardiogram. The 2-D echocardiogram revealed an ejection fraction of 60-65% and no left ventricular regional wall motion abnormalities. Given the lack of chest pain, unremarkable 2-D echocardiogram, and lack of concerning EKG findings, cardiology stated that the low-level abnormal elevation of the troponin I. in a flat pattern was not consistent with ACS. No further workup was recommended. 3. Hypertension. Remained stable on metoprolol. 4. COPD with mild exacerbation. Spiriva was restarted. She was given albuterol nebulizer every 4 hours scheduled. She was given IV Solu-Medrol for 24 hours. Her wheezing subsided.. 5. Tobacco abuse. The patient has decreased her smoking significantly, but she was encouraged to stop smoking altogether. 6. Chronic prednisone therapy secondary to adrenal insufficiency/Addison's disease. She was continued on chronic steroid therapy. 7. Hypothyroidism. She was continued on Synthroid. Her TSH was low, but her free T4 was low normal. Therefore, the dose of Synthroid was continued. Recommend further monitoring of her TSH and free T4 in the outpatient setting. 8. Small cell carcinoma of the lung. The patient was diagnosed last month. She underwent chemotherapy and radiation therapy a few days ago for the first time. Oncology was consulted. They recommended that the patient followup with her primary oncologist, Dr. Earlie Server to determine future treatment plan given questionable allergic reaction following  chemotherapy administration. They also discouraged the use of Neulasta during the time that combined modality treatment will be used as Neulasta--induced bone marrow stimulation during radiation may induce a paradoxical drop in neutrophils as circulating stem cells will be irradiated. The patient will followup with Dr. Earlie Server and the [facility] next week as scheduled.     Procedures: 2-D echocardiogram 08/21/13:- Left ventricle: The cavity size was normal. Wall thickness was increased in a pattern of mild LVH. Systolic function was normal. The estimated ejection fraction was in the range of 60% to 65%. Wall motion was normal; there were no regional wall motion abnormalities. Left ventricular diastolic function parameters were normal. - Aortic valve: Mildly calcified annulus. Trileaflet. There was no regurgitation. - Mitral valve: There was trivial regurgitation. - Left atrium: The atrium was mildly dilated. - Right atrium: Central venous pressure (est): 8 mm Hg. - Atrial septum: No defect or patent foramen ovale was identified. - Tricuspid valve: There was trivial regurgitation. - Pulmonary arteries: PA peak pressure: 27 mm Hg (S). - Pericardium, extracardiac: A prominent pericardial fat pad was present. There was no pericardial effusion. Impressions: - Mild LVH with LVEF 60-65%, overall normal diastolic function. Mild left atrial enlargement. Trivial mitral and tricuspid regurgitation. Normal PASP 27 mm mercury.   Consultations:  Oncology  Discharge Exam: Filed Vitals:   08/21/13 1602  BP: 150/76  Pulse: 72  Temp:   Resp:     General: pleasant alert 58 year old woman sitting up in bed, in no acute distress. Face: No periorbital edema; no facial edema; no oropharyngeal edema or erythema. Cardiovascular: S1, S2, with a soft systolic murmur. Respiratory:occasional upper airway wheezes with no crackles; reading nonlabored.   Discharge Instructions You were cared  for by  a hospitalist during your hospital stay. If you have any questions about your discharge medications or the care you received while you were in the hospital after you are discharged, you can call the unit and asked to speak with the hospitalist on call if the hospitalist that took care of you is not available. Once you are discharged, your primary care physician will handle any further medical issues. Please note that NO REFILLS for any discharge medications will be authorized once you are discharged, as it is imperative that you return to your primary care physician (or establish a relationship with a primary care physician if you do not have one) for your aftercare needs so that they can reassess your need for medications and monitor your lab values.  Discharge Instructions   Diet - low sodium heart healthy    Complete by:  As directed      Discharge instructions    Complete by:  As directed   [place].     Increase activity slowly    Complete by:  As directed             Medication List         albuterol 108 (90 BASE) MCG/ACT inhaler  Commonly known as:  PROVENTIL HFA;VENTOLIN HFA  Inhale 2 puffs into the lungs 3 (three) times daily as needed for wheezing or shortness of breath.     albuterol (2.5 MG/3ML) 0.083% nebulizer solution  Commonly known as:  PROVENTIL  Take 3 mLs (2.5 mg total) by nebulization every 4 (four) hours.     amitriptyline 100 MG tablet  Commonly known as:  ELAVIL  Take 1 tablet (100 mg total) by mouth at bedtime.     baclofen 10 MG tablet  Commonly known as:  LIORESAL  Take 5 mg by mouth daily.     clonazePAM 1 MG tablet  Commonly known as:  KLONOPIN  Take 1 tablet (1 mg total) by mouth 4 (four) times daily.     diphenhydrAMINE 25 MG tablet  Commonly known as:  BENADRYL  Take 1 tablet (25 mg total) by mouth every 6 (six) hours as needed for itching (ALLERGIC REACTION).     divalproex 500 MG 24 hr tablet   Commonly known as:  DEPAKOTE ER  Take 1 tablet (500 mg total) by mouth daily.     donepezil 5 MG tablet  Commonly known as:  ARICEPT  Take 1 tablet (5 mg total) by mouth at bedtime.     DULoxetine 60 MG capsule  Commonly known as:  CYMBALTA  Take 1 capsule (60 mg total) by mouth every morning.     EPINEPHrine 0.3 mg/0.3 mL Soaj injection  Commonly known as:  EPIPEN  Inject 0.3 mLs (0.3 mg total) into the muscle once. PRN FOR SEVERE ALLERGIC REACTION     fentaNYL 25 MCG/HR patch  Commonly known as:  DURAGESIC  Place 1 patch (25 mcg total) onto the skin every 3 (three) days.     HAIR/SKIN/NAILS/BIOTIN PO  Take 1 tablet by mouth daily.     levothyroxine 25 MCG tablet  Commonly known as:  SYNTHROID, LEVOTHROID  Take 25 mcg by mouth daily before breakfast.     lidocaine 5 %  Commonly known as:  LIDODERM  APPLY ONE PATCH TO SKIN EVERY 12 HOURS. 12 HOURS OFF.     lidocaine-prilocaine cream  Commonly known as:  EMLA  Apply 1 application topically as  needed. Apply to port 1 hr before chemo     metoprolol tartrate 25 MG tablet  Commonly known as:  LOPRESSOR  Take 12.5 mg by mouth 2 (two) times daily with a meal.     pantoprazole 40 MG tablet  Commonly known as:  PROTONIX  Take 1 tablet (40 mg total) by mouth daily.     pravastatin 40 MG tablet  Commonly known as:  PRAVACHOL  Take 1 tablet (40 mg total) by mouth every evening.     predniSONE 2.5 MG tablet  Commonly known as:  DELTASONE  Take 3 tablets (7.5 mg total) by mouth daily with breakfast.     prochlorperazine 10 MG tablet  Commonly known as:  COMPAZINE  Take 1 tablet (10 mg total) by mouth every 6 (six) hours as needed for nausea or vomiting.     tiotropium 18 MCG inhalation capsule  Commonly known as:  SPIRIVA  Place 1 capsule (18 mcg total) into inhaler and inhale daily.       Allergies  Allergen Reactions  . Codeine Itching  . Cyclobenzaprine Hives  . Darvocet [Propoxyphene N-Acetaminophen] Rash  .  Abilify [Aripiprazole] Other (See Comments)    tremors  . Ace Inhibitors Other (See Comments)    unknown  . Adhesive [Tape] Hives  . Gabapentin Swelling  . Iodine Hives    Topical iodine, not allergic to  IV contrast  . Levofloxacin Hives and Hypertension  . Prednisone Hives, Itching and Hypertension  . Sulfa Antibiotics Itching  . Robaxin [Methocarbamol] Rash       Follow-up Information   Follow up with [facility].   Contact information:   [address] [place] [address] [phone]       Follow up with [facility]. (FOLLOW UP AS SCHEDULED.)        The results of significant diagnostics from this hospitalization (including imaging, microbiology, ancillary and laboratory) are listed below for reference.    Significant Diagnostic Studies: Dg Chest 2 View  08/20/2013   CLINICAL DATA:  Facial swelling.  Allergic reaction.  EXAM: CHEST  2 VIEW  COMPARISON:  PET CT scan 07/31/2013. Single view of the chest 07/24/2013. CT chest 07/14/2013.  FINDINGS: Small amount of fluid or thickening in the minor fissure is noted. Right hilar adenopathy is seen as on the prior examinations. There is some dependent atelectasis in the lung bases. No pneumothorax or pleural effusion. Right upper lobe pulmonary nodule is identified. Heart size is normal.  IMPRESSION: No acute abnormality.  Right upper lobe pulmonary nodule and right hilar lymphadenopathy are seen as on prior studies.   Electronically Signed   By: Inge Rise M.D.   On: 08/20/2013 07:21   Dg Chest 2 View Within Previous 72 Hours.  Films Obtained On Friday Are Acceptable For Monday And Tuesday Cases  07/24/2013   CLINICAL DATA:  Preoperative chest radiograph for lung surgery. Cough. History of smoking. Known right hilar mass.  EXAM: CHEST  2 VIEW  COMPARISON:  Chest radiograph performed 07/09/2013, and CT of the chest performed 07/14/2013  FINDINGS: The patient's right hilar mass is again  noted. A more peripheral pulmonary nodule is again seen within the right upper lobe. The lungs are otherwise grossly clear. No pleural effusion or pneumothorax is seen.  The heart is normal in size. No acute osseous abnormalities are identified. Cervical spinal fusion hardware is noted. Clips are noted within the right upper quadrant, reflecting prior cholecystectomy.  IMPRESSION: Right hilar mass again noted. More peripheral pulmonary nodule again seen within the right upper lobe. Lungs otherwise clear.   Electronically Signed   By: Garald Balding M.D.   On: 07/24/2013 06:22   Ct Angio Chest Pe W/cm &/or Wo Cm  08/20/2013   CLINICAL DATA:  Periorbital edema, lung cancer.  EXAM: CT ANGIOGRAPHY CHEST WITH CONTRAST  TECHNIQUE: Multidetector CT imaging of the chest was performed using the standard protocol during bolus administration of intravenous contrast. Multiplanar CT image reconstructions and MIPs were obtained to evaluate the vascular anatomy.  CONTRAST:  164mL OMNIPAQUE IOHEXOL 350 MG/ML SOLN  COMPARISON:  CT scan of July 14, 2013.  FINDINGS: No pneumothorax or pleural effusion is noted. 11 mm nodule is noted in right upper lobe which is stable compared to prior exam. 40 x 35 mm right hilar mass is again noted consistent with malignancy. There does not appear to be significant compression or obstruction of the superior vena cava. There is no evidence of pulmonary embolus. Thoracic aorta appears normal without evidence of aneurysm or dissection. Visualized portion of upper abdomen appears normal. Mild subsegmental atelectasis of right middle lobe is noted.  Review of the MIP images confirms the above findings.  IMPRESSION: 4 cm right hilar mass is again noted with associated 11 mm nodule in right upper lobe consistent with metastatic disease. There does not appear to be significant compression of the superior vena cava by this mass. There is no evidence of pulmonary embolus. Mild subsegmental atelectasis of  right middle lobe is noted.   Electronically Signed   By: Sabino Dick M.D.   On: 08/20/2013 16:45   Mr Jeri Cos FA Contrast  08/04/2013   CLINICAL DATA:  History of breast cancer, small cell lung cancer and lymphoma. Headaches, nausea and confusion with right-sided weakness.  EXAM: MRI HEAD WITHOUT AND WITH CONTRAST  TECHNIQUE: Multiplanar, multiecho pulse sequences of the brain and surrounding structures were obtained without and with intravenous contrast.  CONTRAST:  43mL MULTIHANCE GADOBENATE DIMEGLUMINE 529 MG/ML IV SOLN  COMPARISON:  05/23/2013 CT.  04/01/2013 MR.  FINDINGS: Some of the sequences are motion degraded.  No intracranial mass, abnormal enhanced or bony destructive lesion detected to suggest the presence of intracranial metastatic disease  No acute infarct.  No intracranial hemorrhage.  Very mild nonspecific white matter type changes may represent result of small vessel disease.  Mild atrophy without hydrocephalus.  Mild exophthalmos.  Mild cervical spondylotic changes C4-5. Cervical medullary junction, pituitary region and pineal region unremarkable.  Major intracranial vascular structures are patent.  IMPRESSION: Some of the sequences are motion degraded.  No evidence of intracranial metastatic disease  No acute infarct.  Very mild nonspecific white matter type changes may represent result of small vessel disease.  Mild atrophy   Electronically Signed   By: Chauncey Cruel M.D.   On: 08/04/2013 13:43   Nm Pet Image Initial (pi) Skull Base To Thigh  07/31/2013   CLINICAL DATA:  Initial treatment strategy for right upper lobe mass.  EXAM: NUCLEAR MEDICINE PET SKULL BASE TO THIGH  TECHNIQUE: 9.7 mCi F-18 FDG was injected intravenously. Full-ring PET imaging was performed from the skull base to thigh after the radiotracer. CT data was obtained and used for attenuation correction and anatomic localization.  FASTING BLOOD GLUCOSE:  Value: 101 mg/dl  COMPARISON:  Chest CT 07/14/2013.  FINDINGS: NECK   No hypermetabolic lymph nodes in the neck. There is a small amount of physiologic muscular activity  within the right mass inter- and left scalene muscles.  CHEST  Previously noted right upper lobe pulmonary nodule currently measures 1.5 x 1.0 cm and is hypermetabolic (SUVmax = 8.4). No other suspicious appearing nodules in the other lobes of the lungs. Large right hilar nodal mass currently measures approximately 5.3 x 3.0 cm cm, and is markedly hypermetabolic (SUVmax = 16.1). This mass extends toward the hilum immediately anterior to the right mainstem bronchus in the region of the right tracheobronchial angle nodes (10R), but does not extend to or above the level of the carina (i.e., there is no definite low right paratracheal lymph node involvement). No additional enlarged or hypermetabolic mediastinal or left hilar lymph nodes are noted. Heart size is normal. There is atherosclerosis of the thoracic aorta, the great vessels of the mediastinum and the coronary arteries, including calcified atherosclerotic plaque in the left main, left anterior descending and left circumflex coronary arteries.  ABDOMEN/PELVIS  No abnormal hypermetabolic activity within the liver, pancreas, adrenal glands, or spleen. No hypermetabolic lymph nodes in the abdomen or pelvis. Status post cholecystectomy. Status post hysterectomy. Numerous colonic diverticulae are noted, particularly in the region of the sigmoid colon, without surrounding inflammatory changes to suggest an acute diverticulitis at this time.  SKELETON  Healing nondisplaced fractures of the anterior aspects of the right fifth and sixth ribs are noted, both of which demonstrate low-level hypermetabolism (physiologic). No other focal hypermetabolic activity to suggest skeletal metastasis. Multiple nonspecific areas of low-level metabolic activity within the skeletal muscles, particularly the in the adductor compartments of the upper thighs and in the right buttock region,  presumably physiologic.  IMPRESSION: 1. Findings, as above, compatible with a primary bronchogenic neoplasm. Today's study demonstrates T1a, N1, Mx disease (i.e., likely stage IIA). 2. Healing nondisplaced fractures of the anterior aspects of the right fifth and sixth ribs. 3. Atherosclerosis, including left main and 2 vessel coronary artery disease. Please note that although the presence of coronary artery calcium documents the presence of coronary artery disease, the severity of this disease and any potential stenosis cannot be assessed on this non-gated CT examination. Assessment for potential risk factor modification, dietary therapy or pharmacologic therapy may be warranted, if clinically indicated. 4. Additional incidental findings, as above.   Electronically Signed   By: Vinnie Langton M.D.   On: 07/31/2013 14:53   Dg Chest Port 1 View  07/24/2013   CLINICAL DATA:  58 year old female status post bronchoscopy with multiple biopsies. Initial encounter.  EXAM: PORTABLE CHEST - 1 VIEW  COMPARISON:  0509 hr the same day and earlier.  FINDINGS: Portable AP semi upright view at 0941 hrs. No pneumothorax. Mildly lower lung volumes with increased crowding of lung base markings. Stable platelike opacity at the right hilum. Stable cardiac size and mediastinal contours. Visualized tracheal air column is within normal limits. No pleural effusion. No new confluent pulmonary opacity.  IMPRESSION: Mildly lower lung volumes status post bronchoscopy, no pneumothorax or adverse features identified.   Electronically Signed   By: Lars Pinks M.D.   On: 07/24/2013 09:49    Microbiology: Recent Results (from the past 240 hour(s))  MRSA PCR SCREENING     Status: None   Collection Time    08/20/13  9:00 AM      Result Value Ref Range Status   MRSA by PCR NEGATIVE  NEGATIVE Final   Comment:            The GeneXpert MRSA Assay (FDA     approved  for NASAL specimens     only), is one component of a     comprehensive MRSA  colonization     surveillance program. It is not     intended to diagnose MRSA     infection nor to guide or     monitor treatment for     MRSA infections.     Labs: Basic Metabolic Panel:  Recent Labs Lab 08/20/13 0613 08/21/13 0441  NA 138 140  K 4.8 5.0  CL 100 99  CO2 29 31  GLUCOSE 101* 123*  BUN 16 13  CREATININE 0.88 0.81  CALCIUM 8.4 8.6   Liver Function Tests:  Recent Labs Lab 08/20/13 0613  AST 13  ALT 9  ALKPHOS 48  BILITOT <0.2*  PROT 5.9*  ALBUMIN 2.8*   No results found for this basename: LIPASE, AMYLASE,  in the last 168 hours No results found for this basename: AMMONIA,  in the last 168 hours CBC:  Recent Labs Lab 08/20/13 0613 08/21/13 0441  WBC 8.6 6.4  NEUTROABS 6.8  --   HGB 11.2* 10.9*  HCT 34.2* 34.5*  MCV 90.2 90.8  PLT 230 226   Cardiac Enzymes:  Recent Labs Lab 08/20/13 0613 08/20/13 1004 08/20/13 1544 08/20/13 2155  TROPONINI 0.46* 0.47* 0.48* 0.47*   BNP: BNP (last 3 results)  Recent Labs  10/25/12 0930 08/20/13 0613  PROBNP 209.0* 1866.0*   CBG: No results found for this basename: GLUCAP,  in the last 168 hours     Signed:  Kylar Speelman  [facility] 08/21/2013, 4:54 PM

## 2013-08-21 NOTE — Progress Notes (Signed)
  Echocardiogram 2D Echocardiogram has been performed.  [place], [place] 08/21/2013, 3:20 PM

## 2013-08-24 ENCOUNTER — Ambulatory Visit
Admission: RE | Admit: 2013-08-24 | Discharge: 2013-08-24 | Disposition: A | Discharge status: Home / Self Care | Payer: Medicare Other | Source: Ambulatory Visit | Attending: Radiation Oncology | Admitting: Radiation Oncology

## 2013-08-24 ENCOUNTER — Telehealth (HOSPITAL_COMMUNITY): Payer: Self-pay | Admitting: *Deleted

## 2013-08-24 ENCOUNTER — Encounter: Payer: Self-pay | Admitting: *Deleted

## 2013-08-24 ENCOUNTER — Other Ambulatory Visit (HOSPITAL_COMMUNITY): Payer: Self-pay | Admitting: Psychiatry

## 2013-08-24 DIAGNOSIS — Z510 Encounter for antineoplastic radiation therapy: Secondary | ICD-10-CM | POA: Diagnosis present

## 2013-08-24 DIAGNOSIS — C3490 Malignant neoplasm of unspecified part of unspecified bronchus or lung: Secondary | ICD-10-CM | POA: Diagnosis not present

## 2013-08-24 DIAGNOSIS — R918 Other nonspecific abnormal finding of lung field: Secondary | ICD-10-CM

## 2013-08-24 MED ORDER — RADIAPLEXRX EX GEL
Freq: Once | CUTANEOUS | Status: AC
  Administered 2013-08-24: 10:00:00 via TOPICAL

## 2013-08-24 NOTE — Telephone Encounter (Signed)
Pt pharmacy requesting Rx refill for pt Clonazepam 1 mg QID. called pt to confim and she is out of medication. Pt has an up coming appt with Dr. Harrington Challenger 08-26-13.Informed Pt I have to talk to Dr. Harrington Challenger before I can refill Rx. Pt agreed

## 2013-08-25 ENCOUNTER — Other Ambulatory Visit: Payer: Self-pay | Admitting: Family Medicine

## 2013-08-25 ENCOUNTER — Ambulatory Visit
Admission: RE | Admit: 2013-08-25 | Discharge: 2013-08-25 | Disposition: A | Discharge status: Home / Self Care | Payer: Medicare Other | Source: Ambulatory Visit | Attending: Radiation Oncology | Admitting: Radiation Oncology

## 2013-08-25 ENCOUNTER — Telehealth: Payer: Self-pay | Admitting: *Deleted

## 2013-08-25 ENCOUNTER — Other Ambulatory Visit: Payer: Self-pay | Admitting: Radiology

## 2013-08-25 ENCOUNTER — Encounter: Payer: Self-pay | Admitting: Radiation Oncology

## 2013-08-25 VITALS — BP 116/61 | HR 83 | Temp 98.8°F | Resp 20 | Wt 195.4 lb

## 2013-08-25 DIAGNOSIS — C3491 Malignant neoplasm of unspecified part of right bronchus or lung: Secondary | ICD-10-CM

## 2013-08-25 DIAGNOSIS — Z510 Encounter for antineoplastic radiation therapy: Secondary | ICD-10-CM | POA: Diagnosis not present

## 2013-08-25 MED ORDER — BIAFINE EX EMUL
Freq: Two times a day (BID) | CUTANEOUS | Status: DC
  Administered 2013-08-25: 15:00:00 via TOPICAL

## 2013-08-25 NOTE — Telephone Encounter (Signed)
Refill appropriate and filled per protocol. 

## 2013-08-25 NOTE — Telephone Encounter (Signed)
May refill for 30 days. Does she have upcoming appointment?

## 2013-08-25 NOTE — Telephone Encounter (Signed)
Called and left message for patient our secretary Romie Jumper made an appt with Ernestene Kiel, the nutritionist on, Tuesday , 09/01/13 at 3:15pm right after her rad tx,  4:00 PM

## 2013-08-25 NOTE — Progress Notes (Signed)
Weekly Management Note Current Dose:4 Gy  Projected Dose:60 Gy   Narrative:  The patient presents for routine under treatment assessment.  CBCT/MVCT images/Port film x-rays were reviewed.  The chart was checked. Hospitalized after chemo due to allergic reaction. Feeling better now. No complaints.   Physical Findings:  Unchanged  Vitals:  Filed Vitals:   08/25/13 1456  BP: 116/61  Pulse: 83  Temp: 98.8 F (37.1 C)  Resp: 20   Weight:  Wt Readings from Last 3 Encounters:  08/25/13 195 lb 6.4 oz (88.633 kg)  08/21/13 203 lb 0.7 oz (92.1 kg)  08/13/13 192 lb (87.091 kg)   Lab Results  Component Value Date   WBC 6.4 08/21/2013   HGB 10.9* 08/21/2013   HCT 34.5* 08/21/2013   MCV 90.8 08/21/2013   PLT 226 08/21/2013   Lab Results  Component Value Date   CREATININE 0.81 08/21/2013   BUN 13 08/21/2013   NA 140 08/21/2013   K 5.0 08/21/2013   CL 99 08/21/2013   CO2 31 08/21/2013     Impression:  The patient is tolerating radiation.  Plan:  Continue treatment as planned. RN education performed.

## 2013-08-25 NOTE — Progress Notes (Signed)
Weekly rad txs 2/30 completed RUL chest, patient education done, rad book, biafine cream, val Licensed conveyancer business card, is fatigued, no c/o pain, or nausea, has loose non prod cough,  All questions answered, appetite good, no difficulty swallowing food, drinks plenty water, increase protein in diet, needs nutrition consult, gets chemotherapy next week Had allergic reaction to the last chemotheray d/c hospital this past Friday. 3:08 PM

## 2013-08-26 ENCOUNTER — Ambulatory Visit
Admission: RE | Admit: 2013-08-26 | Discharge: 2013-08-26 | Disposition: A | Discharge status: Home / Self Care | Payer: Medicare Other | Source: Ambulatory Visit | Attending: Radiation Oncology | Admitting: Radiation Oncology

## 2013-08-26 ENCOUNTER — Ambulatory Visit (HOSPITAL_BASED_OUTPATIENT_CLINIC_OR_DEPARTMENT_OTHER): Payer: Medicare Other | Admitting: Physician Assistant

## 2013-08-26 ENCOUNTER — Other Ambulatory Visit (HOSPITAL_BASED_OUTPATIENT_CLINIC_OR_DEPARTMENT_OTHER): Payer: Medicare Other

## 2013-08-26 ENCOUNTER — Ambulatory Visit (INDEPENDENT_AMBULATORY_CARE_PROVIDER_SITE_OTHER): Payer: Medicare Other | Admitting: Psychiatry

## 2013-08-26 ENCOUNTER — Other Ambulatory Visit (HOSPITAL_COMMUNITY): Payer: Self-pay | Admitting: *Deleted

## 2013-08-26 ENCOUNTER — Encounter (HOSPITAL_COMMUNITY): Payer: Self-pay | Admitting: Psychiatry

## 2013-08-26 ENCOUNTER — Other Ambulatory Visit: Payer: Self-pay | Admitting: Internal Medicine

## 2013-08-26 ENCOUNTER — Encounter: Payer: Self-pay | Admitting: Physician Assistant

## 2013-08-26 VITALS — BP 106/77 | HR 96 | Ht 67.0 in | Wt 191.6 lb

## 2013-08-26 VITALS — BP 122/110 | HR 100 | Temp 98.1°F | Resp 18 | Ht 67.0 in | Wt 191.7 lb

## 2013-08-26 DIAGNOSIS — R197 Diarrhea, unspecified: Secondary | ICD-10-CM

## 2013-08-26 DIAGNOSIS — F411 Generalized anxiety disorder: Secondary | ICD-10-CM

## 2013-08-26 DIAGNOSIS — Z510 Encounter for antineoplastic radiation therapy: Secondary | ICD-10-CM | POA: Diagnosis not present

## 2013-08-26 DIAGNOSIS — C3410 Malignant neoplasm of upper lobe, unspecified bronchus or lung: Secondary | ICD-10-CM

## 2013-08-26 DIAGNOSIS — C3490 Malignant neoplasm of unspecified part of unspecified bronchus or lung: Secondary | ICD-10-CM

## 2013-08-26 DIAGNOSIS — F4310 Post-traumatic stress disorder, unspecified: Secondary | ICD-10-CM

## 2013-08-26 DIAGNOSIS — F332 Major depressive disorder, recurrent severe without psychotic features: Secondary | ICD-10-CM

## 2013-08-26 DIAGNOSIS — C3491 Malignant neoplasm of unspecified part of right bronchus or lung: Secondary | ICD-10-CM

## 2013-08-26 LAB — COMPREHENSIVE METABOLIC PANEL (CC13)
ALK PHOS: 57 U/L (ref 40–150)
ALT: 13 U/L (ref 0–55)
AST: 13 U/L (ref 5–34)
Albumin: 3.5 g/dL (ref 3.5–5.0)
Anion Gap: 10 mEq/L (ref 3–11)
BILIRUBIN TOTAL: 0.54 mg/dL (ref 0.20–1.20)
BUN: 12.1 mg/dL (ref 7.0–26.0)
CO2: 27 mEq/L (ref 22–29)
CREATININE: 1 mg/dL (ref 0.6–1.1)
Calcium: 9.6 mg/dL (ref 8.4–10.4)
Chloride: 97 mEq/L — ABNORMAL LOW (ref 98–109)
GLUCOSE: 101 mg/dL (ref 70–140)
Potassium: 4.7 mEq/L (ref 3.5–5.1)
SODIUM: 134 meq/L — AB (ref 136–145)
TOTAL PROTEIN: 7.2 g/dL (ref 6.4–8.3)

## 2013-08-26 LAB — CBC WITH DIFFERENTIAL/PLATELET
BASO%: 0.4 % (ref 0.0–2.0)
Basophils Absolute: 0 10*3/uL (ref 0.0–0.1)
EOS ABS: 0 10*3/uL (ref 0.0–0.5)
EOS%: 1 % (ref 0.0–7.0)
HCT: 43 % (ref 34.8–46.6)
HGB: 14 g/dL (ref 11.6–15.9)
LYMPH%: 34.9 % (ref 14.0–49.7)
MCH: 28.8 pg (ref 25.1–34.0)
MCHC: 32.5 g/dL (ref 31.5–36.0)
MCV: 88.7 fL (ref 79.5–101.0)
MONO#: 0 10*3/uL — ABNORMAL LOW (ref 0.1–0.9)
MONO%: 0.8 % (ref 0.0–14.0)
NEUT%: 62.9 % (ref 38.4–76.8)
NEUTROS ABS: 2 10*3/uL (ref 1.5–6.5)
PLATELETS: 177 10*3/uL (ref 145–400)
RBC: 4.85 10*6/uL (ref 3.70–5.45)
RDW: 14.4 % (ref 11.2–14.5)
WBC: 3.2 10*3/uL — ABNORMAL LOW (ref 3.9–10.3)
lymph#: 1.1 10*3/uL (ref 0.9–3.3)

## 2013-08-26 LAB — MAGNESIUM (CC13): MAGNESIUM: 2.3 mg/dL (ref 1.5–2.5)

## 2013-08-26 MED ORDER — CLONAZEPAM 1 MG PO TABS: 1.0000 mg | ORAL_TABLET | Freq: Four times a day (QID) | ORAL | Status: DC

## 2013-08-26 MED ORDER — AMITRIPTYLINE HCL 100 MG PO TABS: 100.0000 mg | ORAL_TABLET | Freq: Every day | ORAL | Status: DC

## 2013-08-26 MED ORDER — DULOXETINE HCL 60 MG PO CPEP: 60.0000 mg | ORAL_CAPSULE | Freq: Every morning | ORAL | Status: DC

## 2013-08-26 MED ORDER — DIVALPROEX SODIUM ER 500 MG PO TB24: 500.0000 mg | ORAL_TABLET | Freq: Every day | ORAL | Status: DC

## 2013-08-26 NOTE — Telephone Encounter (Signed)
Opened in Error.

## 2013-08-26 NOTE — Telephone Encounter (Signed)
Pt has an appt 08-26-13 at 1:15pm. Will give pt her script at the time of her appt. if she do not show up, will print script up and call her to come pick Rx up. Pt is aware of appt due to her calling to verify appt date and time and stating she will have a ride to make it to appt.

## 2013-08-26 NOTE — Progress Notes (Signed)
No images are attached to the encounter. No scans are attached to the encounter. No scans are attached to the encounter. [facility] SHARED VISIT PROGRESS NOTE  Vic Blackbird, MD [address] [place] [address]  DIAGNOSIS: Small cell carcinoma of lung   Primary site: Lung (Right)   Staging method: AJCC 7th Edition   Clinical: Stage IIA (T1a, N1, M0) signed by Curt Bears, MD on 08/06/2013  2:02 PM   Summary: Stage IIA (T1a, N1, M0)  PRIOR THERAPY: none  CURRENT THERAPY: Systemic chemotherapy with carboplatin for AUC of 5 given on day 1 and etoposide at 120 mg read square given on days 1, 2 and 3 with a leftward given on day 4 status post days 1 and 2 of cycle #1  DISEASE STAGE:  Small cell carcinoma of lung   Primary site: Lung (Right)   Staging method: AJCC 7th Edition   Clinical: Stage IIA (T1a, N1, M0) signed by Curt Bears, MD on 08/06/2013  2:02 PM   Summary: Stage IIA (T1a, N1, M0)  CHEMOTHERAPY INTENT: control/palliative  CURRENT # OF CHEMOTHERAPY CYCLES: 1  CURRENT ANTIEMETICS: compazine  CURRENT SMOKING STATUS: current smoker  ORAL CHEMOTHERAPY AND CONSENT: n/a  CURRENT BISPHOSPHONATES USE: none  PAIN MANAGEMENT: none  NARCOTICS INDUCED CONSTIPATION:  none  LIVING WILL AND CODE STATUS:    INTERVAL HISTORY: Tonya Sullivan 58 y.o. female returns for a scheduled regular scheduled visit for followup of her recently diagnosed limited stage (T1 A., N1 M0) small cell lung cancer presenting with a right upper lobe lung nodule as well as large right hilar lymphadenopathy. This was diagnosed in July 2015. She reports that she developed swelling initially about the face but then became more generalized after day 2 of her first cycle of systemic chemotherapy with carboplatin and etoposide. She was hospitalized in any pain for a few days. Patient is convinced that she is allergic to portion of her chemotherapy regimen. Today she reports for a  symptom management visit. She complains of developing "foul smelling diarrhea" yesterday. She is averaging about 4 episodes per day not associated with any fever, chills, blood or notable pus. She's currently undergoing some radiation. she denies any chest pain, cough, hemoptysis or shortness of breath. She denies weight loss or night sweats.  MEDICAL HISTORY: Past Medical History  Diagnosis Date  . COPD (chronic obstructive pulmonary disease)   . Depression   . Chronic back pain     DDD, disc bulge, radiculopathy, spinal stenosis  . Hyperlipidemia   . Anxiety   . Bipolar disorder, unspecified   . Carotid artery calcification   . DDD (degenerative disc disease), lumbosacral   . Spinal stenosis, lumbar   . Autoimmune thyroiditis   . Essential hypertension, benign   . TIA (transient ischemic attack)     August 2014  . GERD (gastroesophageal reflux disease)   . Neuropathic pain   . Addison's disease     On Prednisone  . History of pneumonia   . Achalasia   . Melanoma   . Frequent falls   . DM (diabetes mellitus) with complications   . 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 Hypothyroidism     ALLERGIES:  is allergic to codeine; cyclobenzaprine; darvocet; abilify; ace inhibitors; adhesive; gabapentin; iodine; levofloxacin; prednisone; sulfa antibiotics; and robaxin.  MEDICATIONS:  Current Outpatient Prescriptions  Medication Sig Dispense Refill  . amitriptyline (ELAVIL) 100 MG tablet Take 1 tablet (100 mg total) by  mouth at bedtime.  30 tablet  2  . baclofen (LIORESAL) 10 MG tablet Take 5 mg by mouth daily.      . clonazePAM (KLONOPIN) 1 MG tablet Take 1 tablet (1 mg total) by mouth 4 (four) times daily.  120 tablet  2  . divalproex (DEPAKOTE ER) 500 MG 24 hr tablet Take 1 tablet (500 mg total) by mouth daily.  30 tablet  2  . donepezil (ARICEPT) 5 MG tablet Take 1 tablet (5 mg total) by mouth at bedtime.  30 tablet  3  . DULoxetine (CYMBALTA) 60 MG capsule Take 1 capsule (60  mg total) by mouth every morning.  30 capsule  2  . emollient (BIAFINE) cream Apply 1 application topically 2 (two) times daily. Apply to affected chest area after rad tx and bedtime, just not 4 hours prior to having rad txs, also use on the weekends      . EPINEPHrine (EPIPEN) 0.3 mg/0.3 mL IJ SOAJ injection Inject 0.3 mLs (0.3 mg total) into the muscle once. PRN FOR SEVERE ALLERGIC REACTION  1 Device  6  . fentaNYL (DURAGESIC) 25 MCG/HR patch Place 1 patch (25 mcg total) onto the skin every 3 (three) days.  10 patch  0  . levothyroxine (SYNTHROID, LEVOTHROID) 25 MCG tablet Take 25 mcg by mouth daily before breakfast.      . lidocaine (LIDODERM) 5 % APPLY ONE PATCH TO SKIN EVERY 12 HOURS. 12 HOURS OFF.  30 patch  3  . lidocaine-prilocaine (EMLA) cream Apply 1 application topically as needed. Apply to port 1 hr before chemo  30 g  0  . metoprolol tartrate (LOPRESSOR) 25 MG tablet Take 12.5 mg by mouth 2 (two) times daily with a meal.      . Multiple Vitamins-Minerals (HAIR/SKIN/NAILS/BIOTIN PO) Take 1 tablet by mouth daily.      . pantoprazole (PROTONIX) 40 MG tablet Take 1 tablet (40 mg total) by mouth daily.  30 tablet  3  . pravastatin (PRAVACHOL) 40 MG tablet Take 1 tablet (40 mg total) by mouth every evening.  30 tablet  2  . predniSONE (DELTASONE) 2.5 MG tablet Take 3 tablets (7.5 mg total) by mouth daily with breakfast.      . prochlorperazine (COMPAZINE) 10 MG tablet Take 1 tablet (10 mg total) by mouth every 6 (six) hours as needed for nausea or vomiting.  60 tablet  0  . SPIRIVA RESPIMAT 2.5 MCG/ACT AERS INHALE 2 PUFFS INTO THE LUNGS DAILY.  4 g  6  . VENTOLIN HFA 108 (90 BASE) MCG/ACT inhaler INHALE 2 PUFFS INTO THE LUNGS 3 TIMES DAILY AS NEEDED FOR SHORTNESS OF BREATH AND WHEEZING.  18 g  6  . [DISCONTINUED] Gabapentin, PHN, (GRALISE STARTER) 300 & 600 MG MISC Take 300-1,800 mg by mouth as directed. 15 DAY Starter pack doses     DAY 1- 300 mg     DAY 2- 600 mg     DAY 3 to 6 - 900 mg     DAY 7 to 10 - 1200 mg     DAY 11 to 14 - 1500 mg      DAY 15 - 1800 mg        No current facility-administered medications for this visit.    SURGICAL HISTORY:  Past Surgical History  Procedure Laterality Date  . Appendectomy    . Cholecystectomy    . Lump left breast      Benign  . Abdominal hysterectomy  Tubal pregnancy  . Inguinal hernia repair    . Stomach surgery    . Ectopic pregnancy surgery    . Back surgery      x 5;1984;1989;;1999;2000;2010  . Colonoscopy  01/25/2012    Procedure: COLONOSCOPY;  Surgeon: Rogene Houston, MD;  Location: [facility];  Service: Endoscopy;  Laterality: N/A;  130  . Esophagogastroduodenoscopy (egd) with esophageal dilation N/A 08/13/2012    Procedure: ESOPHAGOGASTRODUODENOSCOPY (EGD) WITH ESOPHAGEAL DILATION;  Surgeon: Rogene Houston, MD;  Location: [facility];  Service: Endoscopy;  Laterality: N/A;  315  . Anterior cervical decomp/discectomy fusion N/A 10/21/2012    Procedure: Cervical Six-Seven Anterior cervical decompression/diskectomy/fusion;  Surgeon: Kristeen Miss, MD;  Location: [facility];  Service: Neurosurgery;  Laterality: N/A;  Cervical Six-Seven Anterior cervical decompression/diskectomy/fusion  . Cataract extraction w/phaco Right 11/25/2012    Procedure: CATARACT EXTRACTION PHACO AND INTRAOCULAR LENS PLACEMENT (IOC);  Surgeon: Elta Guadeloupe T. Gershon Crane, MD;  Location: [facility];  Service: Ophthalmology;  Laterality: Right;  CDE:10.06  . Cataract extraction w/phaco Left 12/09/2012    Procedure: CATARACT EXTRACTION PHACO AND INTRAOCULAR LENS PLACEMENT (IOC);  Surgeon: Elta Guadeloupe T. Gershon Crane, MD;  Location: [facility];  Service: Ophthalmology;  Laterality: Left;  CDE:5.06  . Tonsillectomy    . Hiatal hernia repair    . Heller myotomy N/A 03/17/2013    Procedure: DIAGNOSTIC LAPAROSCOPY, LAPAROSCOPIC HELLER MYOTOMY, ENDOSCOPY, DOR FUNDOPLICATION;  Surgeon: Ralene Ok, MD;  Location: [facility];  Service: General;  Laterality: N/A;  . Breast surgery       Left breast  . Eye surgery    . Video bronchoscopy with endobronchial ultrasound N/A 07/24/2013    Procedure: VIDEO BRONCHOSCOPY WITH ENDOBRONCHIAL ULTRASOUND;  Surgeon: [name], MD;  Location: [facility];  Service: Thoracic;  Laterality: N/A;    REVIEW OF SYSTEMS:  A comprehensive review of systems was negative except for: Constitutional: positive for fatigue Gastrointestinal: positive for diarrhea   PHYSICAL EXAMINATION: General appearance: alert, cooperative, appears stated age and no distress Head: Normocephalic, without obvious abnormality, atraumatic Neck: no adenopathy, no carotid bruit, no JVD, supple, symmetrical, trachea midline and thyroid not enlarged, symmetric, no tenderness/mass/nodules Lymph nodes: Cervical, supraclavicular, and axillary nodes normal. Resp: clear to auscultation bilaterally Back: symmetric, no curvature. ROM normal. No CVA tenderness. Cardio: regular rate and rhythm, S1, S2 normal, no murmur, click, rub or gallop GI: soft, non-tender; bowel sounds normal; no masses,  no organomegaly Extremities: extremities normal, atraumatic, no cyanosis or edema Neurologic: Alert and oriented X 3, normal strength and tone. Normal symmetric reflexes. Normal coordination and gait  ECOG PERFORMANCE STATUS: 1 - Symptomatic but completely ambulatory  Blood pressure 122/110, pulse 100, temperature 98.1 F (36.7 C), temperature source Oral, resp. rate 18, height 5\' 7"  (1.702 m), weight 191 lb 11.2 oz (86.955 kg).  LABORATORY DATA: Lab Results  Component Value Date   WBC 3.2* 08/26/2013   HGB 14.0 08/26/2013   HCT 43.0 08/26/2013   MCV 88.7 08/26/2013   PLT 177 08/26/2013      Chemistry      Component Value Date/Time   NA 134* 08/26/2013 1452   NA 140 08/21/2013 0441   K 4.7 08/26/2013 1452   K 5.0 08/21/2013 0441   CL 99 08/21/2013 0441   CO2 27 08/26/2013 1452   CO2 31 08/21/2013 0441   BUN 12.1 08/26/2013 1452   BUN 13 08/21/2013 0441   CREATININE 1.0 08/26/2013 1452    CREATININE 0.81 08/21/2013 0441  CREATININE 0.86 07/15/2013 1311      Component Value Date/Time   CALCIUM 9.6 08/26/2013 1452   CALCIUM 8.6 08/21/2013 0441   ALKPHOS 57 08/26/2013 1452   ALKPHOS 48 08/20/2013 0613   AST 13 08/26/2013 1452   AST 13 08/20/2013 0613   ALT 13 08/26/2013 1452   ALT 9 08/20/2013 0613   BILITOT 0.54 08/26/2013 1452   BILITOT <0.2* 08/20/2013 0613       RADIOGRAPHIC STUDIES:  Dg Chest 2 View  08/20/2013   CLINICAL DATA:  Facial swelling.  Allergic reaction.  EXAM: CHEST  2 VIEW  COMPARISON:  PET CT scan 07/31/2013. Single view of the chest 07/24/2013. CT chest 07/14/2013.  FINDINGS: Small amount of fluid or thickening in the minor fissure is noted. Right hilar adenopathy is seen as on the prior examinations. There is some dependent atelectasis in the lung bases. No pneumothorax or pleural effusion. Right upper lobe pulmonary nodule is identified. Heart size is normal.  IMPRESSION: No acute abnormality.  Right upper lobe pulmonary nodule and right hilar lymphadenopathy are seen as on prior studies.   Electronically Signed   By: Inge Rise M.D.   On: 08/20/2013 07:21   Ct Angio Chest Pe W/cm &/or Wo Cm  08/20/2013   CLINICAL DATA:  Periorbital edema, lung cancer.  EXAM: CT ANGIOGRAPHY CHEST WITH CONTRAST  TECHNIQUE: Multidetector CT imaging of the chest was performed using the standard protocol during bolus administration of intravenous contrast. Multiplanar CT image reconstructions and MIPs were obtained to evaluate the vascular anatomy.  CONTRAST:  197mL OMNIPAQUE IOHEXOL 350 MG/ML SOLN  COMPARISON:  CT scan of July 14, 2013.  FINDINGS: No pneumothorax or pleural effusion is noted. 11 mm nodule is noted in right upper lobe which is stable compared to prior exam. 40 x 35 mm right hilar mass is again noted consistent with malignancy. There does not appear to be significant compression or obstruction of the superior vena cava. There is no evidence of pulmonary embolus.  Thoracic aorta appears normal without evidence of aneurysm or dissection. Visualized portion of upper abdomen appears normal. Mild subsegmental atelectasis of right middle lobe is noted.  Review of the MIP images confirms the above findings.  IMPRESSION: 4 cm right hilar mass is again noted with associated 11 mm nodule in right upper lobe consistent with metastatic disease. There does not appear to be significant compression of the superior vena cava by this mass. There is no evidence of pulmonary embolus. Mild subsegmental atelectasis of right middle lobe is noted.   Electronically Signed   By: Sabino Dick M.D.   On: 08/20/2013 16:45   Mr Jeri Cos ZO Contrast  08/04/2013   CLINICAL DATA:  History of breast cancer, small cell lung cancer and lymphoma. Headaches, nausea and confusion with right-sided weakness.  EXAM: MRI HEAD WITHOUT AND WITH CONTRAST  TECHNIQUE: Multiplanar, multiecho pulse sequences of the brain and surrounding structures were obtained without and with intravenous contrast.  CONTRAST:  67mL MULTIHANCE GADOBENATE DIMEGLUMINE 529 MG/ML IV SOLN  COMPARISON:  05/23/2013 CT.  04/01/2013 MR.  FINDINGS: Some of the sequences are motion degraded.  No intracranial mass, abnormal enhanced or bony destructive lesion detected to suggest the presence of intracranial metastatic disease  No acute infarct.  No intracranial hemorrhage.  Very mild nonspecific white matter type changes may represent result of small vessel disease.  Mild atrophy without hydrocephalus.  Mild exophthalmos.  Mild cervical spondylotic changes C4-5. Cervical medullary junction, pituitary region and  pineal region unremarkable.  Major intracranial vascular structures are patent.  IMPRESSION: Some of the sequences are motion degraded.  No evidence of intracranial metastatic disease  No acute infarct.  Very mild nonspecific white matter type changes may represent result of small vessel disease.  Mild atrophy   Electronically Signed   By:  Chauncey Cruel M.D.   On: 08/04/2013 13:43   Nm Pet Image Initial (pi) Skull Base To Thigh  07/31/2013   CLINICAL DATA:  Initial treatment strategy for right upper lobe mass.  EXAM: NUCLEAR MEDICINE PET SKULL BASE TO THIGH  TECHNIQUE: 9.7 mCi F-18 FDG was injected intravenously. Full-ring PET imaging was performed from the skull base to thigh after the radiotracer. CT data was obtained and used for attenuation correction and anatomic localization.  FASTING BLOOD GLUCOSE:  Value: 101 mg/dl  COMPARISON:  Chest CT 07/14/2013.  FINDINGS: NECK  No hypermetabolic lymph nodes in the neck. There is a small amount of physiologic muscular activity within the right mass inter- and left scalene muscles.  CHEST  Previously noted right upper lobe pulmonary nodule currently measures 1.5 x 1.0 cm and is hypermetabolic (SUVmax = 8.4). No other suspicious appearing nodules in the other lobes of the lungs. Large right hilar nodal mass currently measures approximately 5.3 x 3.0 cm cm, and is markedly hypermetabolic (SUVmax = 40.9). This mass extends toward the hilum immediately anterior to the right mainstem bronchus in the region of the right tracheobronchial angle nodes (10R), but does not extend to or above the level of the carina (i.e., there is no definite low right paratracheal lymph node involvement). No additional enlarged or hypermetabolic mediastinal or left hilar lymph nodes are noted. Heart size is normal. There is atherosclerosis of the thoracic aorta, the great vessels of the mediastinum and the coronary arteries, including calcified atherosclerotic plaque in the left main, left anterior descending and left circumflex coronary arteries.  ABDOMEN/PELVIS  No abnormal hypermetabolic activity within the liver, pancreas, adrenal glands, or spleen. No hypermetabolic lymph nodes in the abdomen or pelvis. Status post cholecystectomy. Status post hysterectomy. Numerous colonic diverticulae are noted, particularly in the region of  the sigmoid colon, without surrounding inflammatory changes to suggest an acute diverticulitis at this time.  SKELETON  Healing nondisplaced fractures of the anterior aspects of the right fifth and sixth ribs are noted, both of which demonstrate low-level hypermetabolism (physiologic). No other focal hypermetabolic activity to suggest skeletal metastasis. Multiple nonspecific areas of low-level metabolic activity within the skeletal muscles, particularly the in the adductor compartments of the upper thighs and in the right buttock region, presumably physiologic.  IMPRESSION: 1. Findings, as above, compatible with a primary bronchogenic neoplasm. Today's study demonstrates T1a, N1, Mx disease (i.e., likely stage IIA). 2. Healing nondisplaced fractures of the anterior aspects of the right fifth and sixth ribs. 3. Atherosclerosis, including left main and 2 vessel coronary artery disease. Please note that although the presence of coronary artery calcium documents the presence of coronary artery disease, the severity of this disease and any potential stenosis cannot be assessed on this non-gated CT examination. Assessment for potential risk factor modification, dietary therapy or pharmacologic therapy may be warranted, if clinically indicated. 4. Additional incidental findings, as above.   Electronically Signed   By: Vinnie Langton M.D.   On: 07/31/2013 14:53     ASSESSMENT/PLAN: The patient is a very pleasant 58 year old Caucasian female recently diagnosed with limited stage (T1 A., N1, M0) small cell lung cancer presenting with right  upper lobe lung nodule as well as large right hilar lymphadenopathy diagnosed July 2015. She's currently being treated systemic chemotherapy in the form of carboplatin for an AUC of 5 given on day 1 and etoposide at 120 mg per meter squared on days 1, 2 and 3 with Neulasta support given on day 4. She'll receive days 1 and 2 of cycle #1 secondary to developing facial and then  generalized swelling resulting in hospitalization and Forestine Na. Patient feels this was due to an allergic reaction to her chemotherapy. The diarrhea she is experiencing is concerning for C. difficile infection. We will have patient obtain a stool sample and we will send this for C. difficile by PCR. She is scheduled for Port-A-Cath placement 08/27/2013. She is afebrile we will proceed with this procedure as scheduled. Patient was reviewed with Dr. Julien Nordmann. We will consider adding a carboplatin test dose to her next cycle of chemotherapy in the event that when she experienced was related to the carboplatin. She will continue with weekly labs as scheduled and return in 2 weeks for another symptom management visit prior to her second cycle of chemotherapy.   Wynetta Emery, Alyxandra Tenbrink E, PA-C    All questions were answered. The patient knows to call the clinic with any problems, questions or concerns. We can certainly see the patient much sooner if necessary.  I spent 25 minutes counseling the patient face to face. The total time spent in the appointment was 35 minutes.   Addendum: The stool for C. difficile by PCR was negative. Wynetta Emery, Diandre Merica E, PA-C

## 2013-08-26 NOTE — Patient Instructions (Signed)
Obtain stool sample to test for infection Continue weekly labs as scheduled Follow up in 2 weeks, prior to your next scheduled cycle of chemotherapy
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
## 2013-08-27 ENCOUNTER — Ambulatory Visit (HOSPITAL_COMMUNITY)
Admit: 2013-08-27 | Discharge: 2013-08-27 | Disposition: A | Discharge status: Home / Self Care | Payer: Medicare Other | Attending: Internal Medicine | Admitting: Internal Medicine

## 2013-08-27 ENCOUNTER — Telehealth: Payer: Self-pay

## 2013-08-27 ENCOUNTER — Ambulatory Visit: Payer: Medicare Other

## 2013-08-27 ENCOUNTER — Other Ambulatory Visit: Payer: Self-pay | Admitting: Lab

## 2013-08-27 ENCOUNTER — Encounter (HOSPITAL_COMMUNITY): Payer: Self-pay

## 2013-08-27 ENCOUNTER — Telehealth: Payer: Self-pay | Admitting: Internal Medicine

## 2013-08-27 DIAGNOSIS — J449 Chronic obstructive pulmonary disease, unspecified: Secondary | ICD-10-CM | POA: Insufficient documentation

## 2013-08-27 DIAGNOSIS — I10 Essential (primary) hypertension: Secondary | ICD-10-CM | POA: Insufficient documentation

## 2013-08-27 DIAGNOSIS — K219 Gastro-esophageal reflux disease without esophagitis: Secondary | ICD-10-CM | POA: Insufficient documentation

## 2013-08-27 DIAGNOSIS — C3490 Malignant neoplasm of unspecified part of unspecified bronchus or lung: Secondary | ICD-10-CM | POA: Insufficient documentation

## 2013-08-27 DIAGNOSIS — F17200 Nicotine dependence, unspecified, uncomplicated: Secondary | ICD-10-CM | POA: Insufficient documentation

## 2013-08-27 DIAGNOSIS — J4489 Other specified chronic obstructive pulmonary disease: Secondary | ICD-10-CM | POA: Insufficient documentation

## 2013-08-27 DIAGNOSIS — M549 Dorsalgia, unspecified: Secondary | ICD-10-CM | POA: Diagnosis not present

## 2013-08-27 DIAGNOSIS — Z8673 Personal history of transient ischemic attack (TIA), and cerebral infarction without residual deficits: Secondary | ICD-10-CM | POA: Insufficient documentation

## 2013-08-27 DIAGNOSIS — G8929 Other chronic pain: Secondary | ICD-10-CM | POA: Insufficient documentation

## 2013-08-27 DIAGNOSIS — E039 Hypothyroidism, unspecified: Secondary | ICD-10-CM | POA: Diagnosis not present

## 2013-08-27 DIAGNOSIS — Z79899 Other long term (current) drug therapy: Secondary | ICD-10-CM | POA: Insufficient documentation

## 2013-08-27 DIAGNOSIS — E785 Hyperlipidemia, unspecified: Secondary | ICD-10-CM | POA: Diagnosis not present

## 2013-08-27 DIAGNOSIS — E119 Type 2 diabetes mellitus without complications: Secondary | ICD-10-CM | POA: Diagnosis not present

## 2013-08-27 DIAGNOSIS — Z452 Encounter for adjustment and management of vascular access device: Secondary | ICD-10-CM | POA: Insufficient documentation

## 2013-08-27 DIAGNOSIS — E2749 Other adrenocortical insufficiency: Secondary | ICD-10-CM | POA: Diagnosis not present

## 2013-08-27 DIAGNOSIS — R197 Diarrhea, unspecified: Secondary | ICD-10-CM

## 2013-08-27 LAB — CBC
HEMATOCRIT: 38.6 % (ref 36.0–46.0)
Hemoglobin: 12.8 g/dL (ref 12.0–15.0)
MCH: 29.3 pg (ref 26.0–34.0)
MCHC: 33.2 g/dL (ref 30.0–36.0)
MCV: 88.3 fL (ref 78.0–100.0)
Platelets: 108 10*3/uL — ABNORMAL LOW (ref 150–400)
RBC: 4.37 MIL/uL (ref 3.87–5.11)
RDW: 14 % (ref 11.5–15.5)
WBC: 2 10*3/uL — AB (ref 4.0–10.5)

## 2013-08-27 LAB — APTT: aPTT: 25 seconds (ref 24–37)

## 2013-08-27 LAB — BASIC METABOLIC PANEL
Anion gap: 16 — ABNORMAL HIGH (ref 5–15)
BUN: 16 mg/dL (ref 6–23)
CALCIUM: 9.2 mg/dL (ref 8.4–10.5)
CO2: 24 mEq/L (ref 19–32)
Chloride: 93 mEq/L — ABNORMAL LOW (ref 96–112)
Creatinine, Ser: 0.85 mg/dL (ref 0.50–1.10)
GFR calc Af Amer: 86 mL/min — ABNORMAL LOW (ref >90)
GFR calc non Af Amer: 74 mL/min — ABNORMAL LOW (ref >90)
GLUCOSE: 109 mg/dL — AB (ref 70–99)
Potassium: 3.8 mEq/L (ref 3.7–5.3)
SODIUM: 133 meq/L — AB (ref 137–147)

## 2013-08-27 LAB — PROTIME-INR
INR: 0.88 (ref 0.00–1.49)
Prothrombin Time: 11.9 seconds (ref 11.6–15.2)

## 2013-08-27 LAB — CLOSTRIDIUM DIFFICILE BY PCR: CDIFFPCR: NEGATIVE

## 2013-08-27 LAB — GLUCOSE, CAPILLARY: Glucose-Capillary: 101 mg/dL — ABNORMAL HIGH (ref 70–99)

## 2013-08-27 MED ORDER — CEFAZOLIN SODIUM-DEXTROSE 2-3 GM-% IV SOLR
INTRAVENOUS | Status: AC
  Filled 2013-08-27: qty 50

## 2013-08-27 MED ORDER — HEPARIN SOD (PORK) LOCK FLUSH 100 UNIT/ML IV SOLN
INTRAVENOUS | Status: AC
  Filled 2013-08-27: qty 5

## 2013-08-27 MED ORDER — HEPARIN SOD (PORK) LOCK FLUSH 100 UNIT/ML IV SOLN
500.0000 [IU] | Freq: Once | INTRAVENOUS | Status: AC
  Administered 2013-08-27: 500 [IU] via INTRAVENOUS

## 2013-08-27 MED ORDER — FENTANYL CITRATE 0.05 MG/ML IJ SOLN
INTRAMUSCULAR | Status: AC
  Filled 2013-08-27: qty 4

## 2013-08-27 MED ORDER — SODIUM CHLORIDE 0.9 % IV SOLN
Freq: Once | INTRAVENOUS | Status: AC
  Administered 2013-08-27: 08:00:00 via INTRAVENOUS

## 2013-08-27 MED ORDER — MIDAZOLAM HCL 2 MG/2ML IJ SOLN
INTRAMUSCULAR | Status: AC
  Filled 2013-08-27: qty 4

## 2013-08-27 MED ORDER — CEFAZOLIN SODIUM-DEXTROSE 2-3 GM-% IV SOLR
2.0000 g | Freq: Once | INTRAVENOUS | Status: AC
  Administered 2013-08-27: 2 g via INTRAVENOUS

## 2013-08-27 MED ORDER — LIDOCAINE HCL 1 % IJ SOLN
INTRAMUSCULAR | Status: AC
  Filled 2013-08-27: qty 20

## 2013-08-27 MED ORDER — MIDAZOLAM HCL 2 MG/2ML IJ SOLN
INTRAMUSCULAR | Status: AC | PRN
  Administered 2013-08-27: 2 mg via INTRAVENOUS

## 2013-08-27 MED ORDER — FENTANYL CITRATE 0.05 MG/ML IJ SOLN
INTRAMUSCULAR | Status: AC | PRN
  Administered 2013-08-27: 100 ug via INTRAVENOUS

## 2013-08-27 NOTE — Telephone Encounter (Signed)
Received call from [facility], pt to receive [facility] implant today and wanted to know if Dr.Mohamed was ok with her WBC count at 2.0 and also pt having stool sample for possible C-Diff, Per Dr. Julien Nordmann it is okay for surgery today. Informed if they had any questions or concerns to give [place] a call.

## 2013-08-27 NOTE — Progress Notes (Signed)
Patient was to have radiation today after port placed. Spoke to [place] and the radiation machine is currently down and per Nicole/pts. MD it is ok for her not to get her treatment today. Patient informed and will be d/c from Short Stay via the transportation service that brought her. Patient states she has neighbors on either side of her who will be checking in with her/assisting her as needed this afternoon post procedure.

## 2013-08-27 NOTE — Discharge Instructions (Signed)
Remove bandage tomorrow and may shower. Call MD if red, swollen, drainage or fever.      Implanted [facility] Guide An implanted port is a type of central line that is placed under the skin. Central lines are used to provide IV access when treatment or nutrition needs to be given through a person's veins. Implanted ports are used for long-term IV access. An implanted port may be placed because:   You need IV medicine that would be irritating to the small veins in your hands or arms.   You need long-term IV medicines, such as antibiotics.   You need IV nutrition for a long period.   You need frequent blood draws for lab tests.   You need dialysis.  Implanted ports are usually placed in the chest area, but they can also be placed in the upper arm, the abdomen, or the leg. An implanted port has two main parts:   Reservoir. The reservoir is round and will appear as a small, raised area under your skin. The reservoir is the part where a needle is inserted to give medicines or draw blood.   Catheter. The catheter is a thin, flexible tube that extends from the reservoir. The catheter is placed into a large vein. Medicine that is inserted into the reservoir goes into the catheter and then into the vein.  HOW WILL I CARE FOR MY INCISION SITE? Do not get the incision site wet. Bathe or shower as directed by your health care provider.  HOW IS MY PORT ACCESSED? Special steps must be taken to access the port:   Before the port is accessed, a numbing cream can be placed on the skin. This helps numb the skin over the port site.   Your health care provider uses a sterile technique to access the port.  Your health care provider must put on a mask and sterile gloves.  The skin over your port is cleaned carefully with an antiseptic and allowed to dry.  The port is gently pinched between sterile gloves, and a needle is inserted into the port.  Only "non-coring" port needles should be used to  access the port. Once the port is accessed, a blood return should be checked. This helps ensure that the port is in the vein and is not clogged.   If your port needs to remain accessed for a constant infusion, a clear (transparent) bandage will be placed over the needle site. The bandage and needle will need to be changed every week, or as directed by your health care provider.   Keep the bandage covering the needle clean and dry. Do not get it wet. Follow your health care provider's instructions on how to take a shower or bath while the port is accessed.   If your port does not need to stay accessed, no bandage is needed over the port.  WHAT IS FLUSHING? Flushing helps keep the port from getting clogged. Follow your health care provider's instructions on how and when to flush the port. Ports are usually flushed with saline solution or a medicine called heparin. The need for flushing will depend on how the port is used.   If the port is used for intermittent medicines or blood draws, the port will need to be flushed:   After medicines have been given.   After blood has been drawn.   As part of routine maintenance.   If a constant infusion is running, the port may not need to be flushed.  HOW LONG WILL MY PORT STAY IMPLANTED? The port can stay in for as long as your health care provider thinks it is needed. When it is time for the port to come out, surgery will be done to remove it. The procedure is similar to the one performed when the port was put in.  WHEN SHOULD I SEEK IMMEDIATE MEDICAL CARE? When you have an implanted port, you should seek immediate medical care if:   You notice a bad smell coming from the incision site.   You have swelling, redness, or drainage at the incision site.   You have more swelling or pain at the port site or the surrounding area.   You have a fever that is not controlled with medicine. Document Released: 12/18/2004 Document Revised:  10/08/2012 Document Reviewed: 08/25/2012 [facility] Patient Information 2015 [place], [place]. This information is not intended to replace advice given to you by your health care provider. Make sure you discuss any questions you have with your health care provider.

## 2013-08-27 NOTE — Telephone Encounter (Signed)
Per pof i have added an appt with aj as dr mkm is booked,  i have also sent an email to [facility]w to adjust chemo

## 2013-08-27 NOTE — Procedures (Signed)
R IJ Port placement No complication No blood loss. See complete dictation in [facility].

## 2013-08-27 NOTE — H&P (Signed)
Tonya Sullivan is an 58 y.o. female.   Chief Complaint: "I'm getting a port cath" HPI: Patient with history of limited stage small cell lung carcinoma presents today for port a cath placement for chemotherapy.  Past Medical History  Diagnosis Date  . COPD (chronic obstructive pulmonary disease)   . Depression   . Chronic back pain     DDD, disc bulge, radiculopathy, spinal stenosis  . Hyperlipidemia   . Anxiety   . Bipolar disorder, unspecified   . Carotid artery calcification   . DDD (degenerative disc disease), lumbosacral   . Spinal stenosis, lumbar   . Autoimmune thyroiditis   . Essential hypertension, benign   . TIA (transient ischemic attack)     August 2014  . GERD (gastroesophageal reflux disease)   . Neuropathic pain   . Addison's disease     On Prednisone  . History of pneumonia   . Achalasia   . Melanoma   . Frequent falls   . DM (diabetes mellitus) with complications   . 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 Hypothyroidism     Past Surgical History  Procedure Laterality Date  . Appendectomy    . Cholecystectomy    . Lump left breast      Benign  . Abdominal hysterectomy      Tubal pregnancy  . Inguinal hernia repair    . Stomach surgery    . Ectopic pregnancy surgery    . Back surgery      x 5;1984;1989;;1999;2000;2010  . Colonoscopy  01/25/2012    Procedure: COLONOSCOPY;  Surgeon: Rogene Houston, MD;  Location: [facility];  Service: Endoscopy;  Laterality: N/A;  130  . Esophagogastroduodenoscopy (egd) with esophageal dilation N/A 08/13/2012    Procedure: ESOPHAGOGASTRODUODENOSCOPY (EGD) WITH ESOPHAGEAL DILATION;  Surgeon: Rogene Houston, MD;  Location: [facility];  Service: Endoscopy;  Laterality: N/A;  315  . Anterior cervical decomp/discectomy fusion N/A 10/21/2012    Procedure: Cervical Six-Seven Anterior cervical decompression/diskectomy/fusion;  Surgeon: Kristeen Miss, MD;  Location: [place] [facility];  Service: Neurosurgery;  Laterality: N/A;   Cervical Six-Seven Anterior cervical decompression/diskectomy/fusion  . Cataract extraction w/phaco Right 11/25/2012    Procedure: CATARACT EXTRACTION PHACO AND INTRAOCULAR LENS PLACEMENT (IOC);  Surgeon: Elta Guadeloupe T. Gershon Crane, MD;  Location: [facility] ORS;  Service: Ophthalmology;  Laterality: Right;  CDE:10.06  . Cataract extraction w/phaco Left 12/09/2012    Procedure: CATARACT EXTRACTION PHACO AND INTRAOCULAR LENS PLACEMENT (IOC);  Surgeon: Elta Guadeloupe T. Gershon Crane, MD;  Location: [facility];  Service: Ophthalmology;  Laterality: Left;  CDE:5.06  . Tonsillectomy    . Hiatal hernia repair    . Heller myotomy N/A 03/17/2013    Procedure: DIAGNOSTIC LAPAROSCOPY, LAPAROSCOPIC HELLER MYOTOMY, ENDOSCOPY, DOR FUNDOPLICATION;  Surgeon: Ralene Ok, MD;  Location: [facility];  Service: General;  Laterality: N/A;  . Breast surgery      Left breast  . Eye surgery    . Video bronchoscopy with endobronchial ultrasound N/A 07/24/2013    Procedure: VIDEO BRONCHOSCOPY WITH ENDOBRONCHIAL ULTRASOUND;  Surgeon: [name], MD;  Location: [facility] OR;  Service: Thoracic;  Laterality: N/A;    Family History  Problem Relation Age of Onset  . Hypertension Mother   . Heart disease Mother   . Heart attack Mother   . Anxiety disorder Mother   . Lung cancer Father   . Heart attack Father   . Alcohol abuse Father   . Hypertension Brother   . Alcohol abuse Brother   .  Bipolar disorder Brother   . ADD / ADHD Brother   . Drug abuse Brother   . OCD Brother   . Alcohol abuse Sister   . Bipolar disorder Sister   . Anxiety disorder Sister   . ADD / ADHD Sister   . Drug abuse Sister   . Physical abuse Sister   . Sexual abuse Sister   . Alcohol abuse Brother   . Bipolar disorder Brother   . ADD / ADHD Brother   . Drug abuse Brother   . Alcohol abuse Sister   . Bipolar disorder Sister   . Anxiety disorder Sister   . ADD / ADHD Sister   . Physical abuse Sister   . Sexual abuse Sister   . Alcohol abuse Brother   . Bipolar disorder  Brother   . ADD / ADHD Brother   . Bipolar disorder Brother   . ADD / ADHD Brother   . Alcohol abuse Brother   . Paranoid behavior Brother   . Physical abuse Brother   . Sexual abuse Brother   . Bipolar disorder Maternal Aunt   . Bipolar disorder Paternal Aunt   . Bipolar disorder Maternal Uncle   . Bipolar disorder Paternal Uncle   . Bipolar disorder Maternal Grandfather   . Alcohol abuse Maternal Grandfather   . Bipolar disorder Maternal Grandmother   . Alcohol abuse Maternal Grandmother   . Anxiety disorder Maternal Grandmother   . Dementia Maternal Grandmother   . Bipolar disorder Paternal Grandfather   . Alcohol abuse Paternal Grandfather   . Bipolar disorder Paternal Grandmother   . Alcohol abuse Paternal Grandmother   . Drug abuse Paternal Grandmother   . Bipolar disorder Maternal Uncle   . Schizophrenia Neg Hx   . Seizures Neg Hx    Social History:  reports that she has been smoking Cigarettes and E-cigarettes.  She has a 10 pack-year smoking history. She has never used smokeless tobacco. She reports that she does not drink alcohol or use illicit drugs.  Allergies:  Allergies  Allergen Reactions  . Codeine Itching  . Cyclobenzaprine Hives  . Darvocet [Propoxyphene N-Acetaminophen] Rash  . Abilify [Aripiprazole] Other (See Comments)    tremors  . Ace Inhibitors Other (See Comments)    unknown  . Adhesive [Tape] Hives  . Gabapentin Swelling  . Iodine Hives    Topical iodine, not allergic to  IV contrast  . Levofloxacin Hives and Hypertension  . Prednisone Hives, Itching and Hypertension    Can't take more than 7.93m   . Sulfa Antibiotics Itching  . Robaxin [Methocarbamol] Rash    Current outpatient prescriptions:amitriptyline (ELAVIL) 100 MG tablet, Take 1 tablet (100 mg total) by mouth at bedtime., Disp: 30 tablet, Rfl: 2;  baclofen (LIORESAL) 10 MG tablet, Take 5 mg by mouth daily., Disp: , Rfl: ;  clonazePAM (KLONOPIN) 1 MG tablet, Take 1 tablet (1 mg total)  by mouth 4 (four) times daily., Disp: 120 tablet, Rfl: 2 divalproex (DEPAKOTE ER) 500 MG 24 hr tablet, Take 1 tablet (500 mg total) by mouth daily., Disp: 30 tablet, Rfl: 2;  donepezil (ARICEPT) 5 MG tablet, Take 1 tablet (5 mg total) by mouth at bedtime., Disp: 30 tablet, Rfl: 3;  DULoxetine (CYMBALTA) 60 MG capsule, Take 1 capsule (60 mg total) by mouth every morning., Disp: 30 capsule, Rfl: 2 fentaNYL (DURAGESIC) 25 MCG/HR patch, Place 1 patch (25 mcg total) onto the skin every 3 (three) days., Disp: 10 patch, Rfl: 0;  levothyroxine (  SYNTHROID, LEVOTHROID) 25 MCG tablet, Take 25 mcg by mouth daily before breakfast., Disp: , Rfl: ;  metoprolol tartrate (LOPRESSOR) 25 MG tablet, Take 12.5 mg by mouth 2 (two) times daily with a meal., Disp: , Rfl:  pantoprazole (PROTONIX) 40 MG tablet, Take 1 tablet (40 mg total) by mouth daily., Disp: 30 tablet, Rfl: 3;  pravastatin (PRAVACHOL) 40 MG tablet, Take 1 tablet (40 mg total) by mouth every evening., Disp: 30 tablet, Rfl: 2;  predniSONE (DELTASONE) 2.5 MG tablet, Take 3 tablets (7.5 mg total) by mouth daily with breakfast., Disp: , Rfl:  prochlorperazine (COMPAZINE) 10 MG tablet, Take 1 tablet (10 mg total) by mouth every 6 (six) hours as needed for nausea or vomiting., Disp: 60 tablet, Rfl: 0;  albuterol (PROVENTIL HFA;VENTOLIN HFA) 108 (90 BASE) MCG/ACT inhaler, Inhale 2 puffs into the lungs 3 (three) times daily as needed for wheezing or shortness of breath., Disp: , Rfl:  emollient (BIAFINE) cream, Apply 1 application topically 2 (two) times daily. Apply to affected chest area after rad tx and bedtime, just not 4 hours prior to having rad txs, also use on the weekends, Disp: , Rfl: ;  EPINEPHrine (EPIPEN) 0.3 mg/0.3 mL IJ SOAJ injection, Inject 0.3 mLs (0.3 mg total) into the muscle once. PRN FOR SEVERE ALLERGIC REACTION, Disp: 1 Device, Rfl: 6 lidocaine (LIDODERM) 5 %, Place 1 patch onto the skin daily. Remove & Discard patch within 12 hours or as directed by  MD, Disp: , Rfl: ;  lidocaine-prilocaine (EMLA) cream, Apply 1 application topically as needed. Apply to port 1 hr before chemo, Disp: 30 g, Rfl: 0;  PRESCRIPTION MEDICATION, etoposide (VEPESID) 250 mg in sodium chloride 0.9 % 650 mL chemo infusion 120 mg/m2  2.05 m2 (Treatment Plan Actual)  Once, Disp: , Rfl:  PRESCRIPTION MEDICATION, CISplatin (PLATINOL) 123 mg in sodium chloride 0.9 % 500 mL chemo infusion 60 mg/m2  2.05 m2 (Treatment Plan Actual)  Once, Disp: , Rfl: ;  Tiotropium Bromide Monohydrate (SPIRIVA RESPIMAT) 2.5 MCG/ACT AERS, Inhale 2 puffs into the lungs daily., Disp: , Rfl:  [DISCONTINUED] Gabapentin, PHN, (GRALISE STARTER) 300 & 600 MG MISC, Take 300-1,800 mg by mouth as directed. 15 DAY Starter pack doses     DAY 1- 300 mg     DAY 2- 600 mg     DAY 3 to 6 - 900 mg    DAY 7 to 10 - 1200 mg     DAY 11 to 14 - 1500 mg      DAY 15 - 1800 mg , Disp: , Rfl:  Current facility-administered medications:ceFAZolin (ANCEF) IVPB 2 g/50 mL premix, 2 g, Intravenous, Once, Lavonia Drafts, PA-C   Results for orders placed during the hospital encounter of 08/27/13 (from the past 48 hour(s))  APTT     Status: None   Collection Time    08/27/13  8:00 AM      Result Value Ref Range   aPTT 25  24 - 37 seconds  BASIC METABOLIC PANEL     Status: Abnormal   Collection Time    08/27/13  8:00 AM      Result Value Ref Range   Sodium 133 (*) 137 - 147 mEq/L   Potassium 3.8  3.7 - 5.3 mEq/L   Chloride 93 (*) 96 - 112 mEq/L   CO2 24  19 - 32 mEq/L   Glucose, Bld 109 (*) 70 - 99 mg/dL   BUN 16  6 - 23 mg/dL  Creatinine, Ser 0.85  0.50 - 1.10 mg/dL   Calcium 9.2  8.4 - 10.5 mg/dL   GFR calc non Af Amer 74 (*) >90 mL/min   GFR calc Af Amer 86 (*) >90 mL/min   Comment: (NOTE)     The eGFR has been calculated using the CKD EPI equation.     This calculation has not been validated in all clinical situations.     eGFR's persistently <90 mL/min signify possible Chronic Kidney     Disease.   Anion gap  16 (*) 5 - 15  CBC     Status: Abnormal   Collection Time    08/27/13  8:00 AM      Result Value Ref Range   WBC 2.0 (*) 4.0 - 10.5 K/uL   RBC 4.37  3.87 - 5.11 MIL/uL   Hemoglobin 12.8  12.0 - 15.0 g/dL   HCT 38.6  36.0 - 46.0 %   MCV 88.3  78.0 - 100.0 fL   MCH 29.3  26.0 - 34.0 pg   MCHC 33.2  30.0 - 36.0 g/dL   RDW 14.0  11.5 - 15.5 %   Platelets 108 (*) 150 - 400 K/uL   Comment: SPECIMEN CHECKED FOR CLOTS     PLATELET COUNT CONFIRMED BY SMEAR  PROTIME-INR     Status: None   Collection Time    08/27/13  8:00 AM      Result Value Ref Range   Prothrombin Time 11.9  11.6 - 15.2 seconds   INR 0.88  0.00 - 1.49  c diff pending No results found.  Review of Systems  Constitutional: Negative for fever and chills.  Respiratory: Negative for hemoptysis.        Occ cough; some dyspnea with exertion  Cardiovascular: Negative for chest pain.  Gastrointestinal: Positive for diarrhea. Negative for vomiting, abdominal pain and blood in stool.       Recent nausea  Genitourinary: Negative for dysuria and hematuria.  Musculoskeletal: Positive for back pain.  Neurological: Negative for headaches.    Blood pressure 128/76, pulse 103, temperature 98.3 F (36.8 C), temperature source Oral, resp. rate 18, SpO2 96.00%. Physical Exam  Constitutional: She is oriented to person, place, and time. She appears well-developed and well-nourished.  Cardiovascular:  Sl tachy but regular rhythm  Respiratory: Effort normal.  Few  wheezes primarily on right  GI: Soft. Bowel sounds are normal.  Musculoskeletal: Normal range of motion. She exhibits no edema.  Neurological: She is alert and oriented to person, place, and time.     Assessment/Plan Patient with history of limited stage small cell lung carcinoma presents today for port a cath placement for chemotherapy. Details/risks of procedure d/w pt with her understanding and consent.  Aleshia Cartelli,D KEVIN 08/27/2013, 8:50 AM

## 2013-08-28 ENCOUNTER — Ambulatory Visit
Admission: RE | Admit: 2013-08-28 | Discharge: 2013-08-28 | Disposition: A | Discharge status: Home / Self Care | Payer: Medicare Other | Source: Ambulatory Visit | Attending: Radiation Oncology | Admitting: Radiation Oncology

## 2013-08-28 DIAGNOSIS — Z510 Encounter for antineoplastic radiation therapy: Secondary | ICD-10-CM | POA: Diagnosis not present

## 2013-08-31 ENCOUNTER — Ambulatory Visit
Admission: RE | Admit: 2013-08-31 | Discharge: 2013-08-31 | Disposition: A | Discharge status: Home / Self Care | Payer: Medicare Other | Source: Ambulatory Visit | Attending: Radiation Oncology | Admitting: Radiation Oncology

## 2013-08-31 DIAGNOSIS — Z510 Encounter for antineoplastic radiation therapy: Secondary | ICD-10-CM | POA: Diagnosis not present

## 2013-09-01 ENCOUNTER — Ambulatory Visit: Payer: Self-pay

## 2013-09-01 ENCOUNTER — Other Ambulatory Visit: Payer: Self-pay | Admitting: Internal Medicine

## 2013-09-01 ENCOUNTER — Ambulatory Visit: Payer: Medicare Other | Admitting: Radiation Oncology

## 2013-09-01 ENCOUNTER — Ambulatory Visit
Admission: RE | Admit: 2013-09-01 | Discharge: 2013-09-01 | Disposition: A | Discharge status: Home / Self Care | Payer: Medicare Other | Source: Ambulatory Visit | Attending: Radiation Oncology | Admitting: Radiation Oncology

## 2013-09-01 ENCOUNTER — Ambulatory Visit: Payer: Medicare Other | Admitting: Nutrition

## 2013-09-01 ENCOUNTER — Other Ambulatory Visit (HOSPITAL_BASED_OUTPATIENT_CLINIC_OR_DEPARTMENT_OTHER): Payer: Medicare Other

## 2013-09-01 ENCOUNTER — Ambulatory Visit (HOSPITAL_BASED_OUTPATIENT_CLINIC_OR_DEPARTMENT_OTHER): Payer: Medicare Other

## 2013-09-01 ENCOUNTER — Other Ambulatory Visit: Payer: Self-pay

## 2013-09-01 ENCOUNTER — Other Ambulatory Visit: Payer: Self-pay | Admitting: Medical Oncology

## 2013-09-01 VITALS — BP 102/86 | HR 86 | Temp 98.0°F | Resp 18 | Wt 198.1 lb

## 2013-09-01 DIAGNOSIS — C3410 Malignant neoplasm of upper lobe, unspecified bronchus or lung: Secondary | ICD-10-CM

## 2013-09-01 DIAGNOSIS — C3491 Malignant neoplasm of unspecified part of right bronchus or lung: Secondary | ICD-10-CM

## 2013-09-01 DIAGNOSIS — Z510 Encounter for antineoplastic radiation therapy: Secondary | ICD-10-CM | POA: Diagnosis not present

## 2013-09-01 DIAGNOSIS — C3490 Malignant neoplasm of unspecified part of unspecified bronchus or lung: Secondary | ICD-10-CM

## 2013-09-01 DIAGNOSIS — Z5189 Encounter for other specified aftercare: Secondary | ICD-10-CM

## 2013-09-01 DIAGNOSIS — D702 Other drug-induced agranulocytosis: Secondary | ICD-10-CM

## 2013-09-01 LAB — CBC WITH DIFFERENTIAL/PLATELET
BASO%: 0.5 % (ref 0.0–2.0)
BASOS ABS: 0 10*3/uL (ref 0.0–0.1)
EOS ABS: 0 10*3/uL (ref 0.0–0.5)
EOS%: 0.7 % (ref 0.0–7.0)
HCT: 37.2 % (ref 34.8–46.6)
HGB: 12 g/dL (ref 11.6–15.9)
LYMPH#: 0.8 10*3/uL — AB (ref 0.9–3.3)
LYMPH%: 79.9 % — ABNORMAL HIGH (ref 14.0–49.7)
MCH: 28.9 pg (ref 25.1–34.0)
MCHC: 32.3 g/dL (ref 31.5–36.0)
MCV: 89.4 fL (ref 79.5–101.0)
MONO#: 0.2 10*3/uL (ref 0.1–0.9)
MONO%: 17.2 % — AB (ref 0.0–14.0)
NEUT%: 1.7 % — ABNORMAL LOW (ref 38.4–76.8)
NEUTROS ABS: 0 10*3/uL — AB (ref 1.5–6.5)
Platelets: 222 10*3/uL (ref 145–400)
RBC: 4.15 10*6/uL (ref 3.70–5.45)
RDW: 14.6 % — AB (ref 11.2–14.5)
WBC: 1 10*3/uL — ABNORMAL LOW (ref 3.9–10.3)

## 2013-09-01 LAB — COMPREHENSIVE METABOLIC PANEL (CC13)
ALK PHOS: 60 U/L (ref 40–150)
ALT: 24 U/L (ref 0–55)
AST: 13 U/L (ref 5–34)
Albumin: 3.1 g/dL — ABNORMAL LOW (ref 3.5–5.0)
Anion Gap: 8 mEq/L (ref 3–11)
BUN: 8.6 mg/dL (ref 7.0–26.0)
CO2: 29 meq/L (ref 22–29)
Calcium: 8.8 mg/dL (ref 8.4–10.4)
Chloride: 101 mEq/L (ref 98–109)
Creatinine: 0.9 mg/dL (ref 0.6–1.1)
Glucose: 118 mg/dl (ref 70–140)
POTASSIUM: 4.2 meq/L (ref 3.5–5.1)
SODIUM: 138 meq/L (ref 136–145)
TOTAL PROTEIN: 6.6 g/dL (ref 6.4–8.3)
Total Bilirubin: <0.2 mg/dL (ref 0.20–1.20)

## 2013-09-01 LAB — MAGNESIUM (CC13): MAGNESIUM: 2.3 mg/dL (ref 1.5–2.5)

## 2013-09-01 MED ORDER — TBO-FILGRASTIM 480 MCG/0.8ML ~~LOC~~ SOSY
480.0000 ug | PREFILLED_SYRINGE | Freq: Once | SUBCUTANEOUS | Status: AC
  Administered 2013-09-01: 480 ug via SUBCUTANEOUS
  Filled 2013-09-01: qty 0.8

## 2013-09-01 MED ORDER — CEPHALEXIN 500 MG PO CAPS: 500.0000 mg | ORAL_CAPSULE | Freq: Four times a day (QID) | ORAL | Status: DC

## 2013-09-01 NOTE — Progress Notes (Signed)
Patient seen for routine weekly assessment.Placed a masks on patient as neutrophils are 0 and white count is 1.1 after speaking with Diane.Diane will speak to patient to explain what needs to be done to get counts up.

## 2013-09-01 NOTE — Progress Notes (Signed)
Reviewed Neutropenic precautions with pt and she voices understanding. Keflex rx sent to pharmacy. Granix injections scheduled.

## 2013-09-01 NOTE — Progress Notes (Signed)
58 year old female diagnosed with lung cancer.  She is a patient of Dr. Earlie Server and Dr. Pablo Ledger.  Past medical history includes COPD, depression, hyperlipidemia, anxiety, bipolar disorder, GERD, Addison's disease, achalasia, diabetes, and hypothyroidism.  Medications include Klonopin, Cymbalta, Synthroid, Protonix, prednisone, and Compazine.  Labs include glucose of 101 and sodium 133.  C. difficile negative.  Height: 67 inches. Weight: 198.1 pounds. Usual body weight: 195-200 pounds. BMI: 31.02.  Patient denies nutritional issues other than acid reflux.  She reports she sees her gastroenterologist on Friday.  She is able to verbalize appropriate dietary restrictions for acid reflux.  Patient's weight within usual range.  Patient states she has difficulty cooking and is requesting assistance with ensure plus to help with meal replacement.  Nutrition diagnosis: No diagnosis at this time.  Provided patient with one complementary case of Ensure Plus.  Enforced importance of small, frequent meals and did educate patient on strategies for low-cost easy to consume foods that she would not have to cook.  Provided contact information.  No followup scheduled at this time, however, patient agrees to call me if she has problems in the future.  **Disclaimer: This note was dictated with voice recognition software. Similar sounding words can inadvertently be transcribed and this note may contain transcription errors which may not have been corrected upon publication of note.**

## 2013-09-01 NOTE — Progress Notes (Signed)
Weekly Management Note Current Dose:12 Gy  Projected Dose:60 Gy   Narrative:  The patient presents for routine under treatment assessment.  CBCT/MVCT images/Port film x-rays were reviewed.  The chart was checked. Cough improved without intervention. No fever or chills. Needs to see med onc after this for neulasta. No dysphagia.   Physical Findings:  Unchanged. Alert and oriented.   Vitals:  Filed Vitals:   09/01/13 1511  BP: 102/86  Pulse: 86  Temp: 98 F (36.7 C)  Resp: 18   Weight: Increased Wt Readings from Last 3 Encounters:  09/01/13 198 lb 1.6 oz (89.858 kg)  08/26/13 191 lb 11.2 oz (86.955 kg)  08/26/13 191 lb 9.6 oz (86.909 kg)   Lab Results  Component Value Date   WBC 1.0* 09/01/2013   HGB 12.0 09/01/2013   HCT 37.2 09/01/2013   MCV 89.4 09/01/2013   PLT 222 09/01/2013   Lab Results  Component Value Date   CREATININE 0.9 09/01/2013   BUN 8.6 09/01/2013   NA 138 09/01/2013   K 4.2 09/01/2013   CL 93* 08/27/2013   CO2 29 09/01/2013     Impression:  The patient is tolerating radiation.  Plan:  Continue treatment as planned.

## 2013-09-02 ENCOUNTER — Other Ambulatory Visit: Payer: Self-pay

## 2013-09-02 ENCOUNTER — Ambulatory Visit: Payer: Self-pay

## 2013-09-02 ENCOUNTER — Ambulatory Visit (HOSPITAL_BASED_OUTPATIENT_CLINIC_OR_DEPARTMENT_OTHER): Payer: Medicare Other

## 2013-09-02 ENCOUNTER — Ambulatory Visit (INDEPENDENT_AMBULATORY_CARE_PROVIDER_SITE_OTHER): Payer: Medicare Other | Admitting: Psychiatry

## 2013-09-02 ENCOUNTER — Telehealth: Payer: Self-pay | Admitting: Internal Medicine

## 2013-09-02 ENCOUNTER — Ambulatory Visit
Admission: RE | Admit: 2013-09-02 | Discharge: 2013-09-02 | Disposition: A | Discharge status: Home / Self Care | Payer: Medicare Other | Source: Ambulatory Visit | Attending: Radiation Oncology | Admitting: Radiation Oncology

## 2013-09-02 VITALS — Temp 98.7°F

## 2013-09-02 DIAGNOSIS — F332 Major depressive disorder, recurrent severe without psychotic features: Secondary | ICD-10-CM

## 2013-09-02 DIAGNOSIS — C3410 Malignant neoplasm of upper lobe, unspecified bronchus or lung: Secondary | ICD-10-CM

## 2013-09-02 DIAGNOSIS — Z5189 Encounter for other specified aftercare: Secondary | ICD-10-CM

## 2013-09-02 DIAGNOSIS — Z510 Encounter for antineoplastic radiation therapy: Secondary | ICD-10-CM | POA: Diagnosis not present

## 2013-09-02 MED ORDER — TBO-FILGRASTIM 480 MCG/0.8ML ~~LOC~~ SOSY
480.0000 ug | PREFILLED_SYRINGE | Freq: Once | SUBCUTANEOUS | Status: AC
  Administered 2013-09-02: 480 ug via SUBCUTANEOUS

## 2013-09-02 NOTE — Telephone Encounter (Signed)
per 9/1 pof inj appt being scheduled by inj nurse - appts already on schedule (inj nurse). not other orders.

## 2013-09-02 NOTE — Patient Instructions (Signed)
Discussed orally 

## 2013-09-02 NOTE — Progress Notes (Signed)
   THERAPIST PROGRESS NOTE  Session Time: Wednesday 09/02/2013 9:15 AM - 9:50 AM  Participation Level: Active  Behavioral Response: CasualAlertAnxious  Type of Therapy: Individual Therapy  Treatment Goals addressed: Improve ability to manage stress and anxiety using coping skills and relaxation techniques, Improve assertiveness skills and ability to set and maintain boundaries.   Interventions: CBT and Supportive  Summary: Tonya Sullivan is a 58 y.o. female who presents with a long-standing history of depression beginning in adolescence. Patient also has significant trauma history. Patient also experienced grief and loss issues due to to the death of her husband in 2010/10/06. She continues to experience stress related to ongoing health issues as well as family problems.She was hospitalized at the [facility] in May 2015 due to reaction to medication and depression  Patient reports she has been diagnosed with small cell carcinoma and has had 2 chemotherapy treatments. She states she is not afraid as she is in God's hands She does express anxiety about the progression of her  Illness but expresses acceptance that she may eventually need to reside in an assisted living facility. She reports continued improvement in the relationship with her son but continued doubt and frustration with his girlfriend. Per patient's report, girlfriend has made very strict rules and conditions about how and when patient can see her grandchildren. Patient also expresses concern regarding the girlfriend is treating patient's oldest granddaughter. Patient is pleased about recent visits from her brother and her niece. Patient reports feeling better physically and experiencing improved mood since beginning using a pain patch. She states quality of life is better. She reports having down days about 1-2 x per week. She expresses frustration about a neighbor who constantly tries to invade patient's space  calling her several times a day and visits for extended periods of time. Patient is fearful of hurting neighbor's feelings.  Suicidal/Homicidal: No  Therapist Response: Therapist works with patient to process feelings, identify coping statements, identify ways to set and respect boundaries in the relationship with his son and his girlfriend, identify and challenge  thoughts blocking effective assertion, identify ways to improve assertiveness skills and set/maintain boundaries with neighbor  Plan: Return again in 4 weeks.  Diagnosis: Axis I: Major Depression, Recurrent severe    Axis II: Deferred    Graci Hulce, LCSW 09/02/2013

## 2013-09-03 ENCOUNTER — Ambulatory Visit
Admission: RE | Admit: 2013-09-03 | Discharge: 2013-09-03 | Disposition: A | Discharge status: Home / Self Care | Payer: Medicare Other | Source: Ambulatory Visit | Attending: Radiation Oncology | Admitting: Radiation Oncology

## 2013-09-03 ENCOUNTER — Ambulatory Visit: Payer: Self-pay

## 2013-09-03 ENCOUNTER — Other Ambulatory Visit: Payer: Self-pay | Admitting: Medical Oncology

## 2013-09-03 ENCOUNTER — Telehealth: Payer: Self-pay | Admitting: Medical Oncology

## 2013-09-03 ENCOUNTER — Ambulatory Visit (HOSPITAL_BASED_OUTPATIENT_CLINIC_OR_DEPARTMENT_OTHER): Payer: Medicare Other

## 2013-09-03 VITALS — BP 104/70 | HR 94 | Temp 98.4°F

## 2013-09-03 DIAGNOSIS — C3490 Malignant neoplasm of unspecified part of unspecified bronchus or lung: Secondary | ICD-10-CM

## 2013-09-03 DIAGNOSIS — Z510 Encounter for antineoplastic radiation therapy: Secondary | ICD-10-CM | POA: Diagnosis not present

## 2013-09-03 DIAGNOSIS — Z5189 Encounter for other specified aftercare: Secondary | ICD-10-CM

## 2013-09-03 DIAGNOSIS — C3410 Malignant neoplasm of upper lobe, unspecified bronchus or lung: Secondary | ICD-10-CM

## 2013-09-03 MED ORDER — OXYCODONE-ACETAMINOPHEN 5-325 MG PO TABS: 1.0000 | ORAL_TABLET | Freq: Four times a day (QID) | ORAL | Status: DC | PRN

## 2013-09-03 MED ORDER — TBO-FILGRASTIM 480 MCG/0.8ML ~~LOC~~ SOSY
480.0000 ug | PREFILLED_SYRINGE | Freq: Once | SUBCUTANEOUS | Status: AC
  Administered 2013-09-03: 480 ug via SUBCUTANEOUS
  Filled 2013-09-03: qty 0.8

## 2013-09-03 NOTE — Progress Notes (Signed)
Bone pain has not been relieved with tylenol or claritin.Per Julien Nordmann he prescribed percocet . Rx given to pt.

## 2013-09-03 NOTE — Telephone Encounter (Signed)
Describes new onset of bone pain in pelvic bone and arms. She also has frequent , watery stools. I instructed her to take tylenol for pain and she can try Claritin for pain. I also told her to take Imodium and to call back if symptoms do not resolve.

## 2013-09-03 NOTE — Progress Notes (Signed)
Percocet rx given to pt.

## 2013-09-04 ENCOUNTER — Ambulatory Visit (INDEPENDENT_AMBULATORY_CARE_PROVIDER_SITE_OTHER): Payer: Medicare Other | Admitting: Internal Medicine

## 2013-09-04 ENCOUNTER — Telehealth: Payer: Self-pay | Admitting: Internal Medicine

## 2013-09-04 ENCOUNTER — Ambulatory Visit (HOSPITAL_BASED_OUTPATIENT_CLINIC_OR_DEPARTMENT_OTHER): Payer: Medicare Other

## 2013-09-04 ENCOUNTER — Ambulatory Visit
Admission: RE | Admit: 2013-09-04 | Discharge: 2013-09-04 | Disposition: A | Discharge status: Home / Self Care | Payer: Medicare Other | Source: Ambulatory Visit | Attending: Radiation Oncology | Admitting: Radiation Oncology

## 2013-09-04 ENCOUNTER — Encounter (INDEPENDENT_AMBULATORY_CARE_PROVIDER_SITE_OTHER): Payer: Self-pay | Admitting: Internal Medicine

## 2013-09-04 ENCOUNTER — Ambulatory Visit: Payer: Self-pay

## 2013-09-04 VITALS — BP 78/62 | HR 72 | Temp 97.9°F | Ht 69.0 in | Wt 194.1 lb

## 2013-09-04 VITALS — BP 107/67 | HR 92 | Temp 98.2°F

## 2013-09-04 DIAGNOSIS — C3490 Malignant neoplasm of unspecified part of unspecified bronchus or lung: Secondary | ICD-10-CM

## 2013-09-04 DIAGNOSIS — Z23 Encounter for immunization: Secondary | ICD-10-CM | POA: Diagnosis not present

## 2013-09-04 DIAGNOSIS — K219 Gastro-esophageal reflux disease without esophagitis: Secondary | ICD-10-CM

## 2013-09-04 DIAGNOSIS — Z510 Encounter for antineoplastic radiation therapy: Secondary | ICD-10-CM | POA: Diagnosis not present

## 2013-09-04 DIAGNOSIS — Z5189 Encounter for other specified aftercare: Secondary | ICD-10-CM

## 2013-09-04 DIAGNOSIS — C3410 Malignant neoplasm of upper lobe, unspecified bronchus or lung: Secondary | ICD-10-CM | POA: Diagnosis not present

## 2013-09-04 DIAGNOSIS — I6529 Occlusion and stenosis of unspecified carotid artery: Secondary | ICD-10-CM

## 2013-09-04 MED ORDER — INFLUENZA VAC SPLIT QUAD 0.5 ML IM SUSY
0.5000 mL | PREFILLED_SYRINGE | Freq: Once | INTRAMUSCULAR | Status: AC
  Administered 2013-09-04: 0.5 mL via INTRAMUSCULAR
  Filled 2013-09-04: qty 0.5

## 2013-09-04 MED ORDER — TBO-FILGRASTIM 480 MCG/0.8ML ~~LOC~~ SOSY
480.0000 ug | PREFILLED_SYRINGE | Freq: Once | SUBCUTANEOUS | Status: AC
  Administered 2013-09-04: 480 ug via SUBCUTANEOUS
  Filled 2013-09-04: qty 0.8

## 2013-09-04 MED ORDER — DEXLANSOPRAZOLE 60 MG PO CPDR: 60.0000 mg | DELAYED_RELEASE_CAPSULE | Freq: Every day | ORAL | Status: AC

## 2013-09-04 NOTE — Progress Notes (Signed)
Subjective:    Patient ID: Tonya Sullivan, female    DOB: 1955/09/06, 58 y.o.   MRN: [id number]  HPI Here today for f/u of her GERD. She was given Dexilant at her last office visit but she hid them and now she can't find them.  She tells me she is having acid reflux usually at night.  She was last seen in July of this and given samples of Dexilant.  Her appetite is good. No weight loss. She has maintained her weight.She has a BM once a day. ' No melena or BRRB. She is staying tired. Hx of Small cell carcinoma of the lung.   Started radiation/chemo 2 weeks. Chemo was stopped because of a reaction. Next week she will have radiation daily. +   CBC    Component Value Date/Time   WBC 1.0* 09/01/2013 1428   WBC 2.0* 08/27/2013 0800   RBC 4.15 09/01/2013 1428   RBC 4.37 08/27/2013 0800   HGB 12.0 09/01/2013 1428   HGB 12.8 08/27/2013 0800   HCT 37.2 09/01/2013 1428   HCT 38.6 08/27/2013 0800   PLT 222 09/01/2013 1428   PLT 108* 08/27/2013 0800   MCV 89.4 09/01/2013 1428   MCV 88.3 08/27/2013 0800   MCH 28.9 09/01/2013 1428   MCH 29.3 08/27/2013 0800   MCHC 32.3 09/01/2013 1428   MCHC 33.2 08/27/2013 0800   RDW 14.6* 09/01/2013 1428   RDW 14.0 08/27/2013 0800   LYMPHSABS 0.8* 09/01/2013 1428   LYMPHSABS 1.2 08/20/2013 0613   MONOABS 0.2 09/01/2013 1428   MONOABS 0.6 08/20/2013 0613   EOSABS 0.0 09/01/2013 1428   EOSABS 0.0 08/20/2013 0613   BASOSABS 0.0 09/01/2013 1428   BASOSABS 0.0 08/20/2013 0613         07/09/2012 CT chest w CM:  IMPRESSION:  Large right hilar mass with evidence of local extension/ adenopathy  the anterior to the carina. Additionally a peripheral 12 mm lesion  is noted within the right upper lobe. 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 Tissue sampling is recommended.  8/13./2014 EGD: dysphagia:  Impression:  No evidence of esophagitis or stricture at GE junction.  Short fundal wrap.  Nonerosive antral gastritis with a small scar.  Esophagus dilated by  passing 54 and 56 French Maloney dilators but no mucosal disruption noted.  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  If she does not respond to esophageal dilation will consider esophageal manometry.   01/25/2012 Colonoscopy:  Impression:  Examination performed to cecum.  Mild sigmoid colon diverticulosis otherwise normal colonoscopy.  Recommendations:   Review of Systems     Past Medical History  Diagnosis Date  . COPD (chronic obstructive pulmonary disease)   . Depression   . Chronic back pain     DDD, disc bulge, radiculopathy, spinal stenosis  . Hyperlipidemia   . Anxiety   . Bipolar disorder, unspecified   . Carotid artery calcification   . DDD (degenerative disc disease), lumbosacral   . Spinal stenosis, lumbar   . Autoimmune thyroiditis   . Essential hypertension, benign   . TIA (transient ischemic attack)     August 2014  . GERD (gastroesophageal reflux disease)   . Neuropathic pain   . Addison's disease     On Prednisone  . History of pneumonia   . Achalasia   . Melanoma   . Frequent falls   . DM (diabetes mellitus) with complications   . 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  Hypothyroidism     Past Surgical History  Procedure Laterality Date  . Appendectomy    . Cholecystectomy    . Lump left breast      Benign  . Abdominal hysterectomy      Tubal pregnancy  . Inguinal hernia repair    . Stomach surgery    . Ectopic pregnancy surgery    . Back surgery      x 5;1984;1989;;1999;2000;2010  . Colonoscopy  01/25/2012    Procedure: COLONOSCOPY;  Surgeon: Rogene Houston, MD;  Location: [facility];  Service: Endoscopy;  Laterality: N/A;  130  . Esophagogastroduodenoscopy (egd) with esophageal dilation N/A 08/13/2012    Procedure: ESOPHAGOGASTRODUODENOSCOPY (EGD) WITH ESOPHAGEAL DILATION;  Surgeon: Rogene Houston, MD;  Location: [facility];  Service: Endoscopy;  Laterality: N/A;  315  . Anterior cervical decomp/discectomy fusion N/A  10/21/2012    Procedure: Cervical Six-Seven Anterior cervical decompression/diskectomy/fusion;  Surgeon: Kristeen Miss, MD;  Location: [facility];  Service: Neurosurgery;  Laterality: N/A;  Cervical Six-Seven Anterior cervical decompression/diskectomy/fusion  . Cataract extraction w/phaco Right 11/25/2012    Procedure: CATARACT EXTRACTION PHACO AND INTRAOCULAR LENS PLACEMENT (IOC);  Surgeon: Elta Guadeloupe T. Gershon Crane, MD;  Location: [facility];  Service: Ophthalmology;  Laterality: Right;  CDE:10.06  . Cataract extraction w/phaco Left 12/09/2012    Procedure: CATARACT EXTRACTION PHACO AND INTRAOCULAR LENS PLACEMENT (IOC);  Surgeon: Elta Guadeloupe T. Gershon Crane, MD;  Location: [facility];  Service: Ophthalmology;  Laterality: Left;  CDE:5.06  . Tonsillectomy    . Hiatal hernia repair    . Heller myotomy N/A 03/17/2013    Procedure: DIAGNOSTIC LAPAROSCOPY, LAPAROSCOPIC HELLER MYOTOMY, ENDOSCOPY, DOR FUNDOPLICATION;  Surgeon: Ralene Ok, MD;  Location: [facility];  Service: General;  Laterality: N/A;  . Breast surgery      Left breast  . Eye surgery    . Video bronchoscopy with endobronchial ultrasound N/A 07/24/2013    Procedure: VIDEO BRONCHOSCOPY WITH ENDOBRONCHIAL ULTRASOUND;  Surgeon: [name], MD;  Location: [facility];  Service: Thoracic;  Laterality: N/A;    Allergies  Allergen Reactions  . Codeine Itching  . Cyclobenzaprine Hives  . Darvocet [Propoxyphene N-Acetaminophen] Rash  . Abilify [Aripiprazole] Other (See Comments)    tremors  . Ace Inhibitors Other (See Comments)    unknown  . Adhesive [Tape] Hives  . Gabapentin Swelling  . Iodine Hives    Topical iodine, not allergic to  IV contrast  . Levofloxacin Hives and Hypertension  . Prednisone Hives, Itching and Hypertension    Can't take more than 7.5mg    . Sulfa Antibiotics Itching  . Robaxin [Methocarbamol] Rash    Current Outpatient Prescriptions on File Prior to Visit  Medication Sig Dispense Refill  . albuterol (PROVENTIL HFA;VENTOLIN HFA) 108  (90 BASE) MCG/ACT inhaler Inhale 2 puffs into the lungs 3 (three) times daily as needed for wheezing or shortness of breath.      Marland Kitchen amitriptyline (ELAVIL) 100 MG tablet Take 1 tablet (100 mg total) by mouth at bedtime.  30 tablet  2  . baclofen (LIORESAL) 10 MG tablet Take 5 mg by mouth daily.      . cephALEXin (KEFLEX) 500 MG capsule Take 1 capsule (500 mg total) by mouth 4 (four) times daily.  20 capsule  0  . clonazePAM (KLONOPIN) 1 MG tablet Take 1 tablet (1 mg total) by mouth 4 (four) times daily.  120 tablet  2  . divalproex (DEPAKOTE ER) 500 MG 24 hr tablet Take 1 tablet (  500 mg total) by mouth daily.  30 tablet  2  . donepezil (ARICEPT) 5 MG tablet Take 1 tablet (5 mg total) by mouth at bedtime.  30 tablet  3  . DULoxetine (CYMBALTA) 60 MG capsule Take 1 capsule (60 mg total) by mouth every morning.  30 capsule  2  . emollient (BIAFINE) cream Apply 1 application topically 2 (two) times daily. Apply to affected chest area after rad tx and bedtime, just not 4 hours prior to having rad txs, also use on the weekends      . EPINEPHrine (EPIPEN) 0.3 mg/0.3 mL IJ SOAJ injection Inject 0.3 mLs (0.3 mg total) into the muscle once. PRN FOR SEVERE ALLERGIC REACTION  1 Device  6  . fentaNYL (DURAGESIC) 25 MCG/HR patch Place 1 patch (25 mcg total) onto the skin every 3 (three) days.  10 patch  0  . levothyroxine (SYNTHROID, LEVOTHROID) 25 MCG tablet Take 25 mcg by mouth daily before breakfast.      . lidocaine (LIDODERM) 5 % Place 1 patch onto the skin daily. Remove & Discard patch within 12 hours or as directed by MD      . lidocaine-prilocaine (EMLA) cream Apply 1 application topically as needed. Apply to port 1 hr before chemo  30 g  0  . metoprolol tartrate (LOPRESSOR) 25 MG tablet Take 12.5 mg by mouth 2 (two) times daily with a meal.      . oxyCODONE-acetaminophen (PERCOCET/ROXICET) 5-325 MG per tablet Take 1 tablet by mouth every 6 (six) hours as needed for severe pain.  30 tablet  0  .  pantoprazole (PROTONIX) 40 MG tablet Take 1 tablet (40 mg total) by mouth daily.  30 tablet  3  . pravastatin (PRAVACHOL) 40 MG tablet Take 1 tablet (40 mg total) by mouth every evening.  30 tablet  2  . predniSONE (DELTASONE) 2.5 MG tablet Take 3 tablets (7.5 mg total) by mouth daily with breakfast.      . prochlorperazine (COMPAZINE) 10 MG tablet Take 1 tablet (10 mg total) by mouth every 6 (six) hours as needed for nausea or vomiting.  60 tablet  0  . Tiotropium Bromide Monohydrate (SPIRIVA RESPIMAT) 2.5 MCG/ACT AERS Inhale 2 puffs into the lungs daily.      Marland Kitchen PRESCRIPTION MEDICATION etoposide (VEPESID) 250 mg in sodium chloride 0.9 % 650 mL chemo infusion 120 mg/m2  2.05 m2 (Treatment Plan Actual)  Once      . PRESCRIPTION MEDICATION CISplatin (PLATINOL) 123 mg in sodium chloride 0.9 % 500 mL chemo infusion 60 mg/m2  2.05 m2 (Treatment Plan Actual)  Once      . [DISCONTINUED] Gabapentin, PHN, (GRALISE STARTER) 300 & 600 MG MISC Take 300-1,800 mg by mouth as directed. 15 DAY Starter pack doses     DAY 1- 300 mg     DAY 2- 600 mg     DAY 3 to 6 - 900 mg    DAY 7 to 10 - 1200 mg     DAY 11 to 14 - 1500 mg      DAY 15 - 1800 mg        No current facility-administered medications on file prior to visit.     Objective:   Physical Exam  Filed Vitals:   09/04/13 1019  BP: 78/62  Pulse: 72  Temp: 97.9 F (36.6 C)  Height: 5\' 9"  (1.753 m)  Weight: 194 lb 1.6 oz (88.043 kg)    Alert and oriented. Skin  warm and dry. Oral mucosa is moist.   . Sclera anicteric, conjunctivae is pink. Thyroid not enlarged. No cervical lymphadenopathy. Bilateral wheezes. Heart regular rate and rhythm.  Abdomen is soft. Bowel sounds are positive. No hepatomegaly. No abdominal masses felt. No tenderness.  No edema to lower extremities.         Assessment & Plan:  GERD. Has acid reflux about once a day. Presently taking Protonix for her GERD. Am going to switch her to Dexilant and see how she does. She will call  with a PR in 2 weeks.

## 2013-09-04 NOTE — Telephone Encounter (Signed)
LAB TIME ON 9/8 CHANGED TO 815AM - PT AWARE - NO ROOM TO ADJUST TX/FU - PT TO BE SEEN IN TX AREA.

## 2013-09-04 NOTE — Patient Instructions (Signed)
Rx for Dexilant. HOB elevated. OV 6 months.

## 2013-09-08 ENCOUNTER — Ambulatory Visit
Admission: RE | Admit: 2013-09-08 | Discharge: 2013-09-08 | Disposition: A | Discharge status: Home / Self Care | Payer: Medicare Other | Source: Ambulatory Visit | Attending: Radiation Oncology | Admitting: Radiation Oncology

## 2013-09-08 ENCOUNTER — Ambulatory Visit (HOSPITAL_BASED_OUTPATIENT_CLINIC_OR_DEPARTMENT_OTHER): Payer: Medicare Other

## 2013-09-08 ENCOUNTER — Ambulatory Visit (HOSPITAL_BASED_OUTPATIENT_CLINIC_OR_DEPARTMENT_OTHER): Payer: Medicare Other | Admitting: Physician Assistant

## 2013-09-08 ENCOUNTER — Encounter: Payer: Self-pay | Admitting: Physician Assistant

## 2013-09-08 ENCOUNTER — Ambulatory Visit: Payer: Self-pay | Admitting: Radiation Oncology

## 2013-09-08 ENCOUNTER — Telehealth: Payer: Self-pay | Admitting: Internal Medicine

## 2013-09-08 ENCOUNTER — Other Ambulatory Visit (HOSPITAL_BASED_OUTPATIENT_CLINIC_OR_DEPARTMENT_OTHER): Payer: Medicare Other

## 2013-09-08 ENCOUNTER — Other Ambulatory Visit: Payer: Self-pay | Admitting: *Deleted

## 2013-09-08 ENCOUNTER — Other Ambulatory Visit: Payer: Self-pay

## 2013-09-08 VITALS — BP 117/78 | HR 85 | Temp 98.4°F | Resp 18

## 2013-09-08 DIAGNOSIS — Z510 Encounter for antineoplastic radiation therapy: Secondary | ICD-10-CM | POA: Diagnosis not present

## 2013-09-08 DIAGNOSIS — C3410 Malignant neoplasm of upper lobe, unspecified bronchus or lung: Secondary | ICD-10-CM

## 2013-09-08 DIAGNOSIS — Z5111 Encounter for antineoplastic chemotherapy: Secondary | ICD-10-CM

## 2013-09-08 DIAGNOSIS — J449 Chronic obstructive pulmonary disease, unspecified: Secondary | ICD-10-CM

## 2013-09-08 DIAGNOSIS — I6529 Occlusion and stenosis of unspecified carotid artery: Secondary | ICD-10-CM

## 2013-09-08 DIAGNOSIS — C3491 Malignant neoplasm of unspecified part of right bronchus or lung: Secondary | ICD-10-CM

## 2013-09-08 LAB — CBC WITH DIFFERENTIAL/PLATELET
BASO%: 0.5 % (ref 0.0–2.0)
Basophils Absolute: 0.1 10*3/uL (ref 0.0–0.1)
EOS%: 0.2 % (ref 0.0–7.0)
Eosinophils Absolute: 0 10*3/uL (ref 0.0–0.5)
HEMATOCRIT: 40.1 % (ref 34.8–46.6)
HGB: 12.7 g/dL (ref 11.6–15.9)
LYMPH%: 10.4 % — AB (ref 14.0–49.7)
MCH: 28.5 pg (ref 25.1–34.0)
MCHC: 31.6 g/dL (ref 31.5–36.0)
MCV: 90.3 fL (ref 79.5–101.0)
MONO#: 1.3 10*3/uL — AB (ref 0.1–0.9)
MONO%: 10.2 % (ref 0.0–14.0)
NEUT#: 9.9 10*3/uL — ABNORMAL HIGH (ref 1.5–6.5)
NEUT%: 78.7 % — AB (ref 38.4–76.8)
PLATELETS: 250 10*3/uL (ref 145–400)
RBC: 4.44 10*6/uL (ref 3.70–5.45)
RDW: 15.2 % — ABNORMAL HIGH (ref 11.2–14.5)
WBC: 12.5 10*3/uL — ABNORMAL HIGH (ref 3.9–10.3)
lymph#: 1.3 10*3/uL (ref 0.9–3.3)

## 2013-09-08 LAB — MAGNESIUM (CC13): Magnesium: 2.5 mg/dl (ref 1.5–2.5)

## 2013-09-08 LAB — COMPREHENSIVE METABOLIC PANEL (CC13)
ALT: 6 U/L (ref 0–55)
AST: 15 U/L (ref 5–34)
Albumin: 3 g/dL — ABNORMAL LOW (ref 3.5–5.0)
Alkaline Phosphatase: 90 U/L (ref 40–150)
Anion Gap: 11 mEq/L (ref 3–11)
BUN: 9.6 mg/dL (ref 7.0–26.0)
CO2: 31 mEq/L — ABNORMAL HIGH (ref 22–29)
CREATININE: 0.8 mg/dL (ref 0.6–1.1)
Calcium: 8.7 mg/dL (ref 8.4–10.4)
Chloride: 99 mEq/L (ref 98–109)
Glucose: 98 mg/dl (ref 70–140)
Potassium: 3.8 mEq/L (ref 3.5–5.1)
SODIUM: 141 meq/L (ref 136–145)
TOTAL PROTEIN: 6.4 g/dL (ref 6.4–8.3)

## 2013-09-08 MED ORDER — SODIUM CHLORIDE 0.9 % IV SOLN
Freq: Once | INTRAVENOUS | Status: AC
  Administered 2013-09-08: 10:00:00 via INTRAVENOUS

## 2013-09-08 MED ORDER — DEXAMETHASONE SODIUM PHOSPHATE 20 MG/5ML IJ SOLN
INTRAMUSCULAR | Status: AC
  Filled 2013-09-08: qty 5

## 2013-09-08 MED ORDER — CISPLATIN CHEMO INJECTION 100MG/100ML
60.0000 mg/m2 | Freq: Once | INTRAVENOUS | Status: AC
  Administered 2013-09-08: 123 mg via INTRAVENOUS
  Filled 2013-09-08: qty 123

## 2013-09-08 MED ORDER — PALONOSETRON HCL INJECTION 0.25 MG/5ML
INTRAVENOUS | Status: AC
  Filled 2013-09-08: qty 5

## 2013-09-08 MED ORDER — POTASSIUM CHLORIDE 2 MEQ/ML IV SOLN
Freq: Once | INTRAVENOUS | Status: AC
  Administered 2013-09-08: 10:00:00 via INTRAVENOUS
  Filled 2013-09-08: qty 10

## 2013-09-08 MED ORDER — SODIUM CHLORIDE 0.9 % IV SOLN
120.0000 mg/m2 | Freq: Once | INTRAVENOUS | Status: AC
  Administered 2013-09-08: 250 mg via INTRAVENOUS
  Filled 2013-09-08: qty 12.5

## 2013-09-08 MED ORDER — HEPARIN SOD (PORK) LOCK FLUSH 100 UNIT/ML IV SOLN
500.0000 [IU] | Freq: Once | INTRAVENOUS | Status: DC
  Filled 2013-09-08: qty 5

## 2013-09-08 MED ORDER — SODIUM CHLORIDE 0.9 % IJ SOLN
10.0000 mL | INTRAMUSCULAR | Status: DC | PRN
  Administered 2013-09-08: 10 mL
  Filled 2013-09-08: qty 10

## 2013-09-08 MED ORDER — DEXAMETHASONE SODIUM PHOSPHATE 20 MG/5ML IJ SOLN
12.0000 mg | Freq: Once | INTRAMUSCULAR | Status: AC
  Administered 2013-09-08: 12 mg via INTRAVENOUS

## 2013-09-08 MED ORDER — HEPARIN SOD (PORK) LOCK FLUSH 100 UNIT/ML IV SOLN
500.0000 [IU] | Freq: Once | INTRAVENOUS | Status: AC | PRN
  Administered 2013-09-08: 500 [IU]
  Filled 2013-09-08: qty 5

## 2013-09-08 MED ORDER — PALONOSETRON HCL INJECTION 0.25 MG/5ML
0.2500 mg | Freq: Once | INTRAVENOUS | Status: AC
  Administered 2013-09-08: 0.25 mg via INTRAVENOUS

## 2013-09-08 MED ORDER — SODIUM CHLORIDE 0.9 % IJ SOLN
10.0000 mL | INTRAMUSCULAR | Status: DC | PRN
  Filled 2013-09-08: qty 10

## 2013-09-08 MED ORDER — SODIUM CHLORIDE 0.9 % IV SOLN
150.0000 mg | Freq: Once | INTRAVENOUS | Status: AC
  Administered 2013-09-08: 150 mg via INTRAVENOUS
  Filled 2013-09-08: qty 5

## 2013-09-08 NOTE — Progress Notes (Addendum)
No images are attached to the encounter. No scans are attached to the encounter. No scans are attached to the encounter. [place] VISIT PROGRESS NOTE  Vic Blackbird, MD [address] [address]  DIAGNOSIS: Small cell carcinoma of lung   Primary site: Lung (Right)   Staging method: AJCC 7th Edition   Clinical: Stage IIA (T1a, N1, M0) signed by Curt Bears, MD on 08/06/2013  2:02 PM   Summary: Stage IIA (T1a, N1, M0)  PRIOR THERAPY: none  CURRENT THERAPY:  Systemic chemotherapy with cisplatin 60 mg/m2 on day1, etoposide 120 mg/m2 on days 1, 2, and 3 with neulasta support on day 4. Status post 1 cycle.  INTERVAL HISTORY: Tonya Sullivan 58 y.o. female returns for a scheduled regular symptom visit for followup of recently diagnosed limited stage small cell lung cancer. She status post 1 cycle of systemic chemotherapy with cisplatin and etoposide with less port. Overall she tolerated her first cycle relatively well the exception of delayed nausea and vomiting. She did have an emergency room visit related to nausea and vomiting. She is encouraged to take her antiemetics on a more regular basis as well as increase her fluid intake. Today she voices no specific complaints and is ready to proceed with cycle #2 of her chemotherapy as scheduled. She denied fever, chills, cough or hemoptysis. She has some shortness breath with exertion. She denied any current nausea or vomiting, diarrhea or constipation. She denied any significant weight loss or night sweats.   MEDICAL HISTORY: Past Medical History  Diagnosis Date  . COPD (chronic obstructive pulmonary disease)   . Depression   . Chronic back pain     DDD, disc bulge, radiculopathy, spinal stenosis  . Hyperlipidemia   . Anxiety   . Bipolar disorder, unspecified   . Carotid artery calcification   . DDD (degenerative disc disease), lumbosacral   . Spinal stenosis, lumbar   . Autoimmune thyroiditis   .  Essential hypertension, benign   . TIA (transient ischemic attack)     August 2014  . GERD (gastroesophageal reflux disease)   . Neuropathic pain   . Addison's disease     On Prednisone  . History of pneumonia   . Achalasia   . Melanoma   . Frequent falls   . DM (diabetes mellitus) with complications   . 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 Hypothyroidism     ALLERGIES:  is allergic to codeine; cyclobenzaprine; darvocet; abilify; ace inhibitors; adhesive; gabapentin; iodine; levofloxacin; prednisone; sulfa antibiotics; and robaxin.  MEDICATIONS:  Current Outpatient Prescriptions  Medication Sig Dispense Refill  . albuterol (PROVENTIL HFA;VENTOLIN HFA) 108 (90 BASE) MCG/ACT inhaler Inhale 2 puffs into the lungs 3 (three) times daily as needed for wheezing or shortness of breath.      Marland Kitchen amitriptyline (ELAVIL) 100 MG tablet Take 1 tablet (100 mg total) by mouth at bedtime.  30 tablet  2  . baclofen (LIORESAL) 10 MG tablet Take 5 mg by mouth daily.      . cephALEXin (KEFLEX) 500 MG capsule Take 1 capsule (500 mg total) by mouth 4 (four) times daily.  20 capsule  0  . clonazePAM (KLONOPIN) 1 MG tablet Take 1 tablet (1 mg total) by mouth 4 (four) times daily.  120 tablet  2  . dexlansoprazole (DEXILANT) 60 MG capsule Take 1 capsule (60 mg total) by mouth daily.  30 capsule  3  . divalproex (DEPAKOTE ER) 500  MG 24 hr tablet Take 1 tablet (500 mg total) by mouth daily.  30 tablet  2  . donepezil (ARICEPT) 5 MG tablet Take 1 tablet (5 mg total) by mouth at bedtime.  30 tablet  3  . DULoxetine (CYMBALTA) 60 MG capsule Take 1 capsule (60 mg total) by mouth every morning.  30 capsule  2  . emollient (BIAFINE) cream Apply 1 application topically 2 (two) times daily. Apply to affected chest area after rad tx and bedtime, just not 4 hours prior to having rad txs, also use on the weekends      . EPINEPHrine (EPIPEN) 0.3 mg/0.3 mL IJ SOAJ injection Inject 0.3 mLs (0.3 mg total) into the muscle  once. PRN FOR SEVERE ALLERGIC REACTION  1 Device  6  . fentaNYL (DURAGESIC) 25 MCG/HR patch Place 1 patch (25 mcg total) onto the skin every 3 (three) days.  10 patch  0  . levothyroxine (SYNTHROID, LEVOTHROID) 25 MCG tablet Take 25 mcg by mouth daily before breakfast.      . lidocaine (LIDODERM) 5 % Place 1 patch onto the skin daily. Remove & Discard patch within 12 hours or as directed by MD      . lidocaine-prilocaine (EMLA) cream Apply 1 application topically as needed. Apply to port 1 hr before chemo  30 g  0  . metoprolol tartrate (LOPRESSOR) 25 MG tablet Take 12.5 mg by mouth 2 (two) times daily with a meal.      . oxyCODONE-acetaminophen (PERCOCET/ROXICET) 5-325 MG per tablet Take 1 tablet by mouth every 6 (six) hours as needed for severe pain.  30 tablet  0  . pantoprazole (PROTONIX) 40 MG tablet Take 1 tablet (40 mg total) by mouth daily.  30 tablet  3  . pravastatin (PRAVACHOL) 40 MG tablet Take 1 tablet (40 mg total) by mouth every evening.  30 tablet  2  . predniSONE (DELTASONE) 2.5 MG tablet Take 3 tablets (7.5 mg total) by mouth daily with breakfast.      . PRESCRIPTION MEDICATION etoposide (VEPESID) 250 mg in sodium chloride 0.9 % 650 mL chemo infusion 120 mg/m2  2.05 m2 (Treatment Plan Actual)  Once      . PRESCRIPTION MEDICATION CISplatin (PLATINOL) 123 mg in sodium chloride 0.9 % 500 mL chemo infusion 60 mg/m2  2.05 m2 (Treatment Plan Actual)  Once      . prochlorperazine (COMPAZINE) 10 MG tablet Take 1 tablet (10 mg total) by mouth every 6 (six) hours as needed for nausea or vomiting.  60 tablet  0  . Tiotropium Bromide Monohydrate (SPIRIVA RESPIMAT) 2.5 MCG/ACT AERS Inhale 2 puffs into the lungs daily.      . [DISCONTINUED] Gabapentin, PHN, (GRALISE STARTER) 300 & 600 MG MISC Take 300-1,800 mg by mouth as directed. 15 DAY Starter pack doses     DAY 1- 300 mg     DAY 2- 600 mg     DAY 3 to 6 - 900 mg    DAY 7 to 10 - 1200 mg     DAY 11 to 14 - 1500 mg      DAY 15 - 1800 mg         No current facility-administered medications for this visit.   Facility-Administered Medications Ordered in Other Visits  Medication Dose Route Frequency Provider Last Rate Last Dose  . etoposide (VEPESID) 250 mg in sodium chloride 0.9 % 650 mL chemo infusion  120 mg/m2 (Treatment Plan Actual) Intravenous Once [facility],  MD 663 mL/hr at 09/08/13 1558 250 mg at 09/08/13 1558    SURGICAL HISTORY:  Past Surgical History  Procedure Laterality Date  . Appendectomy    . Cholecystectomy    . Lump left breast      Benign  . Abdominal hysterectomy      Tubal pregnancy  . Inguinal hernia repair    . Stomach surgery    . Ectopic pregnancy surgery    . Back surgery      x 5;1984;1989;;1999;2000;2010  . Colonoscopy  01/25/2012    Procedure: COLONOSCOPY;  Surgeon: Rogene Houston, MD;  Location: [facility];  Service: Endoscopy;  Laterality: N/A;  130  . Esophagogastroduodenoscopy (egd) with esophageal dilation N/A 08/13/2012    Procedure: ESOPHAGOGASTRODUODENOSCOPY (EGD) WITH ESOPHAGEAL DILATION;  Surgeon: Rogene Houston, MD;  Location: [facility];  Service: Endoscopy;  Laterality: N/A;  315  . Anterior cervical decomp/discectomy fusion N/A 10/21/2012    Procedure: Cervical Six-Seven Anterior cervical decompression/diskectomy/fusion;  Surgeon: Kristeen Miss, MD;  Location: [facility];  Service: Neurosurgery;  Laterality: N/A;  Cervical Six-Seven Anterior cervical decompression/diskectomy/fusion  . Cataract extraction w/phaco Right 11/25/2012    Procedure: CATARACT EXTRACTION PHACO AND INTRAOCULAR LENS PLACEMENT (IOC);  Surgeon: Elta Guadeloupe T. Gershon Crane, MD;  Location: [facility];  Service: Ophthalmology;  Laterality: Right;  CDE:10.06  . Cataract extraction w/phaco Left 12/09/2012    Procedure: CATARACT EXTRACTION PHACO AND INTRAOCULAR LENS PLACEMENT (IOC);  Surgeon: Elta Guadeloupe T. Gershon Crane, MD;  Location: [facility];  Service: Ophthalmology;  Laterality: Left;  CDE:5.06  . Tonsillectomy    . Hiatal hernia  repair    . Heller myotomy N/A 03/17/2013    Procedure: DIAGNOSTIC LAPAROSCOPY, LAPAROSCOPIC HELLER MYOTOMY, ENDOSCOPY, DOR FUNDOPLICATION;  Surgeon: Ralene Ok, MD;  Location: [facility];  Service: General;  Laterality: N/A;  . Breast surgery      Left breast  . Eye surgery    . Video bronchoscopy with endobronchial ultrasound N/A 07/24/2013    Procedure: VIDEO BRONCHOSCOPY WITH ENDOBRONCHIAL ULTRASOUND;  Surgeon: [name], MD;  Location: [facility];  Service: Thoracic;  Laterality: N/A;    REVIEW OF SYSTEMS:  Review of Systems  Constitutional: Negative for fever, chills, weight loss, malaise/fatigue and diaphoresis.  HENT: Negative for congestion, ear discharge, ear pain, hearing loss, nosebleeds, sore throat and tinnitus.   Eyes: Negative for blurred vision, double vision, photophobia, pain, discharge and redness.  Respiratory: Positive for shortness of breath. Negative for cough, hemoptysis, sputum production, wheezing and stridor.   Cardiovascular: Negative for chest pain, palpitations, orthopnea, claudication, leg swelling and PND.  Gastrointestinal: Negative for heartburn, nausea, vomiting, abdominal pain, diarrhea, constipation, blood in stool and melena.  Genitourinary: Negative.   Musculoskeletal: Negative.   Skin: Negative.   Neurological: Negative for dizziness, tingling, focal weakness, seizures, weakness and headaches.  Endo/Heme/Allergies: Does not bruise/bleed easily.  Psychiatric/Behavioral: Negative for depression. The patient is not nervous/anxious and does not have insomnia.      PHYSICAL EXAMINATION: Physical Exam  Constitutional: She is oriented to person, place, and time and well-developed, well-nourished, and in no distress.  HENT:  Head: Normocephalic and atraumatic.  Mouth/Throat: Oropharynx is clear and moist.  Eyes: Pupils are equal, round, and reactive to light.  Neck: Normal range of motion. Neck supple. No JVD present. No tracheal deviation present. No  thyromegaly present.  Cardiovascular: Normal rate, regular rhythm, normal heart sounds and intact distal pulses.  Exam reveals no gallop and no friction rub.   No murmur  heard. Pulmonary/Chest: Effort normal. No respiratory distress. She has wheezes. She has no rales.  Abdominal: Soft. Bowel sounds are normal. She exhibits no distension and no mass. There is no tenderness.  Musculoskeletal: Normal range of motion. She exhibits no edema and no tenderness.  Lymphadenopathy:    She has no cervical adenopathy.  Neurological: She is alert and oriented to person, place, and time. She has normal reflexes. Gait normal.  Skin: Skin is warm and dry. No rash noted.    ECOG PERFORMANCE STATUS: 1 - Symptomatic but completely ambulatory  There were no vitals taken for this visit.  LABORATORY DATA: Lab Results  Component Value Date   WBC 12.5* 09/08/2013   HGB 12.7 09/08/2013   HCT 40.1 09/08/2013   MCV 90.3 09/08/2013   PLT 250 09/08/2013      Chemistry      Component Value Date/Time   NA 141 09/08/2013 0830   NA 133* 08/27/2013 0800   K 3.8 09/08/2013 0830   K 3.8 08/27/2013 0800   CL 93* 08/27/2013 0800   CO2 31* 09/08/2013 0830   CO2 24 08/27/2013 0800   BUN 9.6 09/08/2013 0830   BUN 16 08/27/2013 0800   CREATININE 0.8 09/08/2013 0830   CREATININE 0.85 08/27/2013 0800   CREATININE 0.86 07/15/2013 1311      Component Value Date/Time   CALCIUM 8.7 09/08/2013 0830   CALCIUM 9.2 08/27/2013 0800   ALKPHOS 90 09/08/2013 0830   ALKPHOS 48 08/20/2013 0613   AST 15 09/08/2013 0830   AST 13 08/20/2013 0613   ALT 6 09/08/2013 0830   ALT 9 08/20/2013 0613   BILITOT <0.20 09/08/2013 0830   BILITOT <0.2* 08/20/2013 0613       RADIOGRAPHIC STUDIES:  Dg Chest 2 View  08/20/2013   CLINICAL DATA:  Facial swelling.  Allergic reaction.  EXAM: CHEST  2 VIEW  COMPARISON:  PET CT scan 07/31/2013. Single view of the chest 07/24/2013. CT chest 07/14/2013.  FINDINGS: Small amount of fluid or thickening in the minor fissure is noted.  Right hilar adenopathy is seen as on the prior examinations. There is some dependent atelectasis in the lung bases. No pneumothorax or pleural effusion. Right upper lobe pulmonary nodule is identified. Heart size is normal.  IMPRESSION: No acute abnormality.  Right upper lobe pulmonary nodule and right hilar lymphadenopathy are seen as on prior studies.   Electronically Signed   By: Inge Rise M.D.   On: 08/20/2013 07:21   Ct Angio Chest Pe W/cm &/or Wo Cm  08/20/2013   CLINICAL DATA:  Periorbital edema, lung cancer.  EXAM: CT ANGIOGRAPHY CHEST WITH CONTRAST  TECHNIQUE: Multidetector CT imaging of the chest was performed using the standard protocol during bolus administration of intravenous contrast. Multiplanar CT image reconstructions and MIPs were obtained to evaluate the vascular anatomy.  CONTRAST:  137mL OMNIPAQUE IOHEXOL 350 MG/ML SOLN  COMPARISON:  CT scan of July 14, 2013.  FINDINGS: No pneumothorax or pleural effusion is noted. 11 mm nodule is noted in right upper lobe which is stable compared to prior exam. 40 x 35 mm right hilar mass is again noted consistent with malignancy. There does not appear to be significant compression or obstruction of the superior vena cava. There is no evidence of pulmonary embolus. Thoracic aorta appears normal without evidence of aneurysm or dissection. Visualized portion of upper abdomen appears normal. Mild subsegmental atelectasis of right middle lobe is noted.  Review of the MIP images confirms the above  findings.  IMPRESSION: 4 cm right hilar mass is again noted with associated 11 mm nodule in right upper lobe consistent with metastatic disease. There does not appear to be significant compression of the superior vena cava by this mass. There is no evidence of pulmonary embolus. Mild subsegmental atelectasis of right middle lobe is noted.   Electronically Signed   By: Sabino Dick M.D.   On: 08/20/2013 16:45   Ir Fluoro Guide Cv Line Right  08/27/2013    CLINICAL DATA:  Lung carcinoma, needs venous access for chemotherapy.  EXAM: TUNNELED PORT CATHETER PLACEMENT WITH ULTRASOUND AND FLUOROSCOPIC GUIDANCE  FLUOROSCOPY TIME:  48 seconds  ANESTHESIA/SEDATION: Intravenous Fentanyl and Versed were administered as conscious sedation during continuous cardiorespiratory monitoring by the radiology RN, with a total moderate sedation time of 15 minutes.  TECHNIQUE: The procedure, risks, benefits, and alternatives were explained to the patient. Questions regarding the procedure were encouraged and answered. The patient understands and consents to the procedure. As antibiotic prophylaxis, cefazolin 2 g was ordered pre-procedure and administered intravenously within one hour of incision. Patency of the right IJ vein was confirmed with ultrasound with image documentation. An appropriate skin site was determined. Skin site was marked. Region was prepped using maximum barrier technique including cap and mask, sterile gown, sterile gloves, large sterile sheet, and Chlorhexidine as cutaneous antisepsis. The region was infiltrated locally with 1% lidocaine. Under real-time ultrasound guidance, the right IJ vein was accessed with a 21 gauge micropuncture needle; the needle tip within the vein was confirmed with ultrasound image documentation. Needle was exchanged over a 018 guidewire for transitional dilator which allowed passage of the [facility] wire into the IVC. Over this, the transitional dilator was exchanged for a 5 [place] MPA catheter. A small incision was made on the right anterior chest wall and a subcutaneous pocket fashioned. The power-injectable port was positioned and its catheter tunneled to the right IJ dermatotomy site. The MPA catheter was exchanged over an Amplatz wire for a peel-away sheath, through which the port catheter, which had been trimmed to the appropriate length, was advanced and positioned under fluoroscopy with its tip at the cavoatrial junction. Spot chest  radiograph confirms good catheter position and no pneumothorax. The pocket was closed with deep interrupted and subcuticular continuous 3-0 Monocryl sutures. The port was flushed per protocol. The incisions were covered with Dermabond then covered with a sterile dressing. No immediate complication.  IMPRESSION: Technically successful right IJ power-injectable port catheter placement. Ready for routine use.   Electronically Signed   By: Arne Cleveland M.D.   On: 08/27/2013 12:37   Ir US Guide Vasc Access Right  08/27/2013   CLINICAL DATA:  Lung carcinoma, needs venous access for chemotherapy.  EXAM: TUNNELED PORT CATHETER PLACEMENT WITH ULTRASOUND AND FLUOROSCOPIC GUIDANCE  FLUOROSCOPY TIME:  48 seconds  ANESTHESIA/SEDATION: Intravenous Fentanyl and Versed were administered as conscious sedation during continuous cardiorespiratory monitoring by the radiology RN, with a total moderate sedation time of 15 minutes.  TECHNIQUE: The procedure, risks, benefits, and alternatives were explained to the patient. Questions regarding the procedure were encouraged and answered. The patient understands and consents to the procedure. As antibiotic prophylaxis, cefazolin 2 g was ordered pre-procedure and administered intravenously within one hour of incision. Patency of the right IJ vein was confirmed with ultrasound with image documentation. An appropriate skin site was determined. Skin site was marked. Region was prepped using maximum barrier technique including cap and mask, sterile gown, sterile gloves, large sterile sheet,  and Chlorhexidine as cutaneous antisepsis. The region was infiltrated locally with 1% lidocaine. Under real-time ultrasound guidance, the right IJ vein was accessed with a 21 gauge micropuncture needle; the needle tip within the vein was confirmed with ultrasound image documentation. Needle was exchanged over a 018 guidewire for transitional dilator which allowed passage of the [facility] wire into the IVC.  Over this, the transitional dilator was exchanged for a 5 [place] MPA catheter. A small incision was made on the right anterior chest wall and a subcutaneous pocket fashioned. The power-injectable port was positioned and its catheter tunneled to the right IJ dermatotomy site. The MPA catheter was exchanged over an Amplatz wire for a peel-away sheath, through which the port catheter, which had been trimmed to the appropriate length, was advanced and positioned under fluoroscopy with its tip at the cavoatrial junction. Spot chest radiograph confirms good catheter position and no pneumothorax. The pocket was closed with deep interrupted and subcuticular continuous 3-0 Monocryl sutures. The port was flushed per protocol. The incisions were covered with Dermabond then covered with a sterile dressing. No immediate complication.  IMPRESSION: Technically successful right IJ power-injectable port catheter placement. Ready for routine use.   Electronically Signed   By: Arne Cleveland M.D.   On: 08/27/2013 12:37     ASSESSMENT/PLAN:  Small cell carcinoma of lung 58 year old Caucasian female recently diagnosed with limited stage small cell lung cancer. Currently being treated with systemic chemotherapy in the form of cisplatin at 60 mg per meter squared given on day 1 and etoposide at 120 mg per meter squared given on days 1, 2 and 3 with Neulasta support given on day 4. Status post 1 cycle. She tolerated her cycle relatively well the exception of delayed nausea and vomiting. Patient is advised to increase her fluid intake as well as take her antiemetics on a more regular basis. She'll proceed with cycle #2 today as scheduled and return in 3 weeks with a restaging CT scan of her chest with contrast to reevaluate her disease. She will continue with weekly labs as scheduled in the interim.   Patient was discussed with and also seen by Dr. Wyonia Hough, Ludwig Tugwell E, PA-C  All questions were answered. The patient knows  to call the clinic with any problems, questions or concerns. We can certainly see the patient much sooner if necessary.  ADDENDUM: Hematology/Oncology Attending: I had a face to face encounter with the patient. I recommended her care plan. This is a very pleasant 58 years old white female with limited stage small cell lung cancer currently undergoing systemic chemotherapy with cisplatin and etoposide status post 1 cycle. Her first cycle was complicated with pancytopenia and the patient required treatment with Neupogen for few days. She is feeling much better today. We will proceed with cycle #2 today as scheduled. The patient would receive Neulasta injection on day 4 of her treatment. She would come back for followup visit in 3 weeks with repeat CT scan of the chest for evaluation of her disease. She was advised to call immediately if she has any concerning symptoms in the interval.  Disclaimer: This note was dictated with voice recognition software. Similar sounding words can inadvertently be transcribed and may be missed upon review. Eilleen Kempf., MD 09/09/2013

## 2013-09-08 NOTE — Telephone Encounter (Signed)
lvm for pt regarding to updated sched....advised pt to pick up new sched

## 2013-09-08 NOTE — Patient Instructions (Signed)
[facility] Discharge Instructions for Patients Receiving Chemotherapy  Today you received the following chemotherapy agents:  Cisplatin and Etoposide  To help prevent nausea and vomiting after your treatment, we encourage you to take your nausea medication as ordered per MD.   If you develop nausea and vomiting that is not controlled by your nausea medication, call the clinic.   BELOW ARE SYMPTOMS THAT SHOULD BE REPORTED IMMEDIATELY:  *FEVER GREATER THAN 100.5 F  *CHILLS WITH OR WITHOUT FEVER  NAUSEA AND VOMITING THAT IS NOT CONTROLLED WITH YOUR NAUSEA MEDICATION  *UNUSUAL SHORTNESS OF BREATH  *UNUSUAL BRUISING OR BLEEDING  TENDERNESS IN MOUTH AND THROAT WITH OR WITHOUT PRESENCE OF ULCERS  *URINARY PROBLEMS  *BOWEL PROBLEMS  UNUSUAL RASH Items with * indicate a potential emergency and should be followed up as soon as possible.  Feel free to call the clinic you have any questions or concerns. The clinic phone number is [phone].

## 2013-09-09 ENCOUNTER — Ambulatory Visit: Payer: Medicare Other

## 2013-09-09 ENCOUNTER — Ambulatory Visit (HOSPITAL_BASED_OUTPATIENT_CLINIC_OR_DEPARTMENT_OTHER): Payer: Medicare Other

## 2013-09-09 ENCOUNTER — Telehealth: Payer: Self-pay | Admitting: Family Medicine

## 2013-09-09 ENCOUNTER — Ambulatory Visit
Admission: RE | Admit: 2013-09-09 | Discharge: 2013-09-09 | Disposition: A | Discharge status: Home / Self Care | Payer: Medicare Other | Source: Ambulatory Visit | Attending: Radiation Oncology | Admitting: Radiation Oncology

## 2013-09-09 ENCOUNTER — Other Ambulatory Visit (HOSPITAL_BASED_OUTPATIENT_CLINIC_OR_DEPARTMENT_OTHER): Payer: Medicare Other

## 2013-09-09 ENCOUNTER — Ambulatory Visit: Payer: Medicare Other | Admitting: Radiation Oncology

## 2013-09-09 VITALS — BP 131/77 | HR 87 | Temp 98.1°F | Resp 18

## 2013-09-09 DIAGNOSIS — C3491 Malignant neoplasm of unspecified part of right bronchus or lung: Secondary | ICD-10-CM

## 2013-09-09 DIAGNOSIS — C3410 Malignant neoplasm of upper lobe, unspecified bronchus or lung: Secondary | ICD-10-CM

## 2013-09-09 DIAGNOSIS — Z5111 Encounter for antineoplastic chemotherapy: Secondary | ICD-10-CM

## 2013-09-09 DIAGNOSIS — Z510 Encounter for antineoplastic radiation therapy: Secondary | ICD-10-CM | POA: Diagnosis not present

## 2013-09-09 DIAGNOSIS — Z95828 Presence of other vascular implants and grafts: Secondary | ICD-10-CM

## 2013-09-09 LAB — CBC WITH DIFFERENTIAL/PLATELET
BASO%: 0.6 % (ref 0.0–2.0)
Basophils Absolute: 0.1 10*3/uL (ref 0.0–0.1)
EOS ABS: 0 10*3/uL (ref 0.0–0.5)
EOS%: 0.1 % (ref 0.0–7.0)
HEMATOCRIT: 38 % (ref 34.8–46.6)
HEMOGLOBIN: 12.2 g/dL (ref 11.6–15.9)
LYMPH%: 5.6 % — AB (ref 14.0–49.7)
MCH: 28.9 pg (ref 25.1–34.0)
MCHC: 32 g/dL (ref 31.5–36.0)
MCV: 90.2 fL (ref 79.5–101.0)
MONO#: 0.9 10*3/uL (ref 0.1–0.9)
MONO%: 8.3 % (ref 0.0–14.0)
NEUT%: 85.4 % — AB (ref 38.4–76.8)
NEUTROS ABS: 9.3 10*3/uL — AB (ref 1.5–6.5)
PLATELETS: 208 10*3/uL (ref 145–400)
RBC: 4.22 10*6/uL (ref 3.70–5.45)
RDW: 15.2 % — ABNORMAL HIGH (ref 11.2–14.5)
WBC: 10.9 10*3/uL — ABNORMAL HIGH (ref 3.9–10.3)
lymph#: 0.6 10*3/uL — ABNORMAL LOW (ref 0.9–3.3)

## 2013-09-09 LAB — COMPREHENSIVE METABOLIC PANEL (CC13)
ALBUMIN: 2.9 g/dL — AB (ref 3.5–5.0)
ALT: 23 U/L (ref 0–55)
AST: 26 U/L (ref 5–34)
Alkaline Phosphatase: 83 U/L (ref 40–150)
Anion Gap: 10 mEq/L (ref 3–11)
BUN: 13.5 mg/dL (ref 7.0–26.0)
CO2: 26 mEq/L (ref 22–29)
Calcium: 8.7 mg/dL (ref 8.4–10.4)
Chloride: 101 mEq/L (ref 98–109)
Creatinine: 0.8 mg/dL (ref 0.6–1.1)
GLUCOSE: 103 mg/dL (ref 70–140)
POTASSIUM: 4.9 meq/L (ref 3.5–5.1)
Sodium: 137 mEq/L (ref 136–145)
Total Protein: 6.4 g/dL (ref 6.4–8.3)

## 2013-09-09 LAB — MAGNESIUM (CC13): Magnesium: 2.5 mg/dl (ref 1.5–2.5)

## 2013-09-09 MED ORDER — SODIUM CHLORIDE 0.9 % IJ SOLN
10.0000 mL | INTRAMUSCULAR | Status: DC | PRN
  Administered 2013-09-09 (×2): 10 mL via INTRAVENOUS
  Filled 2013-09-09: qty 10

## 2013-09-09 MED ORDER — DEXAMETHASONE SODIUM PHOSPHATE 10 MG/ML IJ SOLN
10.0000 mg | Freq: Once | INTRAMUSCULAR | Status: AC
  Administered 2013-09-09: 10 mg via INTRAVENOUS

## 2013-09-09 MED ORDER — ETOPOSIDE CHEMO INJECTION 1 GM/50ML
120.0000 mg/m2 | Freq: Once | INTRAVENOUS | Status: AC
  Administered 2013-09-09: 250 mg via INTRAVENOUS
  Filled 2013-09-09: qty 12.5

## 2013-09-09 MED ORDER — HEPARIN SOD (PORK) LOCK FLUSH 100 UNIT/ML IV SOLN
500.0000 [IU] | Freq: Once | INTRAVENOUS | Status: AC | PRN
  Administered 2013-09-09: 500 [IU]
  Filled 2013-09-09: qty 5

## 2013-09-09 MED ORDER — DEXAMETHASONE SODIUM PHOSPHATE 10 MG/ML IJ SOLN
INTRAMUSCULAR | Status: AC
  Filled 2013-09-09: qty 1

## 2013-09-09 MED ORDER — SODIUM CHLORIDE 0.9 % IV SOLN
Freq: Once | INTRAVENOUS | Status: AC
  Administered 2013-09-09: 10:00:00 via INTRAVENOUS

## 2013-09-09 MED ORDER — FENTANYL 25 MCG/HR TD PT72: 25.0000 ug | MEDICATED_PATCH | TRANSDERMAL | Status: DC

## 2013-09-09 NOTE — Patient Instructions (Signed)
Continue weekly labs as scheduled Increase your fluid intake Take your Compazine more regularly Followup in 3 weeks with a restaging CT scan of your chest to reevaluate her disease

## 2013-09-09 NOTE — Patient Instructions (Signed)

## 2013-09-09 NOTE — Telephone Encounter (Signed)
Patient is calling to get refill on her pain patches  [phone]

## 2013-09-09 NOTE — Telephone Encounter (Signed)
Ok to refill??  Last office visit 08/13/2013.  Last refill 08/11/2013.

## 2013-09-09 NOTE — Telephone Encounter (Signed)
Prescription printed and patient made aware to come to office to pick up.  

## 2013-09-09 NOTE — Telephone Encounter (Signed)
Okay to refill? 

## 2013-09-09 NOTE — Patient Instructions (Signed)
[facility] Discharge Instructions for Patients Receiving Chemotherapy  Today you received the following chemotherapy agents: Etoposide.  To help prevent nausea and vomiting after your treatment, we encourage you to take your nausea medication as prescribed.   If you develop nausea and vomiting that is not controlled by your nausea medication, call the clinic.   BELOW ARE SYMPTOMS THAT SHOULD BE REPORTED IMMEDIATELY:  *FEVER GREATER THAN 100.5 F  *CHILLS WITH OR WITHOUT FEVER  NAUSEA AND VOMITING THAT IS NOT CONTROLLED WITH YOUR NAUSEA MEDICATION  *UNUSUAL SHORTNESS OF BREATH  *UNUSUAL BRUISING OR BLEEDING  TENDERNESS IN MOUTH AND THROAT WITH OR WITHOUT PRESENCE OF ULCERS  *URINARY PROBLEMS  *BOWEL PROBLEMS  UNUSUAL RASH Items with * indicate a potential emergency and should be followed up as soon as possible.  Feel free to call the clinic you have any questions or concerns. The clinic phone number is [phone].

## 2013-09-09 NOTE — Assessment & Plan Note (Addendum)
58 year old Caucasian female recently diagnosed with limited stage small cell lung cancer. Currently being treated with systemic chemotherapy in the form of cisplatin at 60 mg per meter squared given on day 1 and etoposide at 120 mg per meter squared given on days 1, 2 and 3 with Neulasta support given on day 4. Status post 1 cycle. She tolerated her cycle relatively well the exception of delayed nausea and vomiting. Patient is advised to increase her fluid intake as well as take her antiemetics on a more regular basis. She'll proceed with cycle #2 today as scheduled and return in 3 weeks with a restaging CT scan of her chest with contrast to reevaluate her disease. She will continue with weekly labs as scheduled in the interim.

## 2013-09-10 ENCOUNTER — Encounter: Payer: Self-pay | Admitting: *Deleted

## 2013-09-10 ENCOUNTER — Ambulatory Visit
Admission: RE | Admit: 2013-09-10 | Discharge: 2013-09-10 | Disposition: A | Discharge status: Home / Self Care | Payer: Medicare Other | Source: Ambulatory Visit | Attending: Radiation Oncology | Admitting: Radiation Oncology

## 2013-09-10 ENCOUNTER — Ambulatory Visit (HOSPITAL_BASED_OUTPATIENT_CLINIC_OR_DEPARTMENT_OTHER): Payer: Medicare Other

## 2013-09-10 ENCOUNTER — Other Ambulatory Visit: Payer: Self-pay

## 2013-09-10 VITALS — BP 155/101 | HR 76 | Temp 98.5°F | Resp 18

## 2013-09-10 DIAGNOSIS — C3491 Malignant neoplasm of unspecified part of right bronchus or lung: Secondary | ICD-10-CM

## 2013-09-10 DIAGNOSIS — C3410 Malignant neoplasm of upper lobe, unspecified bronchus or lung: Secondary | ICD-10-CM

## 2013-09-10 DIAGNOSIS — Z510 Encounter for antineoplastic radiation therapy: Secondary | ICD-10-CM | POA: Diagnosis not present

## 2013-09-10 DIAGNOSIS — Z5111 Encounter for antineoplastic chemotherapy: Secondary | ICD-10-CM

## 2013-09-10 MED ORDER — SODIUM CHLORIDE 0.9 % IV SOLN
120.0000 mg/m2 | Freq: Once | INTRAVENOUS | Status: AC
  Administered 2013-09-10: 250 mg via INTRAVENOUS
  Filled 2013-09-10: qty 12.5

## 2013-09-10 MED ORDER — DEXAMETHASONE SODIUM PHOSPHATE 10 MG/ML IJ SOLN
INTRAMUSCULAR | Status: AC
  Filled 2013-09-10: qty 1

## 2013-09-10 MED ORDER — DEXAMETHASONE SODIUM PHOSPHATE 10 MG/ML IJ SOLN
10.0000 mg | Freq: Once | INTRAMUSCULAR | Status: AC
  Administered 2013-09-10: 10 mg via INTRAVENOUS

## 2013-09-10 MED ORDER — SODIUM CHLORIDE 0.9 % IJ SOLN
10.0000 mL | INTRAMUSCULAR | Status: DC | PRN
  Administered 2013-09-10: 10 mL
  Filled 2013-09-10: qty 10

## 2013-09-10 MED ORDER — HEPARIN SOD (PORK) LOCK FLUSH 100 UNIT/ML IV SOLN
500.0000 [IU] | Freq: Once | INTRAVENOUS | Status: AC | PRN
  Administered 2013-09-10: 500 [IU]
  Filled 2013-09-10: qty 5

## 2013-09-10 MED ORDER — SODIUM CHLORIDE 0.9 % IV SOLN
Freq: Once | INTRAVENOUS | Status: AC
  Administered 2013-09-10: 09:00:00 via INTRAVENOUS

## 2013-09-10 NOTE — [facility] Oncology Navigator Note (Unsigned)
Spoke with patient in chemo today.  Unfortunately, she states she is smoking again.  I gave her tips on how to quit.  She agreed to brand switch for 4 days and then quit.  I stated I would follow up with her and she was ok with that.

## 2013-09-10 NOTE — Patient Instructions (Signed)
New [facility] Discharge Instructions for Patients Receiving Chemotherapy  Today you received the following chemotherapy agents: Etoposide.  To help prevent nausea and vomiting after your treatment, we encourage you to take your nausea medication as prescribed.   If you develop nausea and vomiting that is not controlled by your nausea medication, call the clinic.   BELOW ARE SYMPTOMS THAT SHOULD BE REPORTED IMMEDIATELY:  *FEVER GREATER THAN 100.5 F  *CHILLS WITH OR WITHOUT FEVER  NAUSEA AND VOMITING THAT IS NOT CONTROLLED WITH YOUR NAUSEA MEDICATION  *UNUSUAL SHORTNESS OF BREATH  *UNUSUAL BRUISING OR BLEEDING  TENDERNESS IN MOUTH AND THROAT WITH OR WITHOUT PRESENCE OF ULCERS  *URINARY PROBLEMS  *BOWEL PROBLEMS  UNUSUAL RASH Items with * indicate a potential emergency and should be followed up as soon as possible.  Feel free to call the clinic you have any questions or concerns. The clinic phone number is [phone].

## 2013-09-11 ENCOUNTER — Ambulatory Visit (HOSPITAL_BASED_OUTPATIENT_CLINIC_OR_DEPARTMENT_OTHER): Payer: Medicare Other

## 2013-09-11 ENCOUNTER — Ambulatory Visit (INDEPENDENT_AMBULATORY_CARE_PROVIDER_SITE_OTHER): Payer: Self-pay | Admitting: Internal Medicine

## 2013-09-11 ENCOUNTER — Ambulatory Visit
Admission: RE | Admit: 2013-09-11 | Discharge: 2013-09-11 | Disposition: A | Discharge status: Home / Self Care | Payer: Medicare Other | Source: Ambulatory Visit | Attending: Radiation Oncology | Admitting: Radiation Oncology

## 2013-09-11 ENCOUNTER — Other Ambulatory Visit: Payer: Self-pay | Admitting: *Deleted

## 2013-09-11 VITALS — BP 126/81 | HR 75 | Temp 98.1°F

## 2013-09-11 DIAGNOSIS — C3491 Malignant neoplasm of unspecified part of right bronchus or lung: Secondary | ICD-10-CM

## 2013-09-11 DIAGNOSIS — Z510 Encounter for antineoplastic radiation therapy: Secondary | ICD-10-CM | POA: Diagnosis not present

## 2013-09-11 DIAGNOSIS — C3490 Malignant neoplasm of unspecified part of unspecified bronchus or lung: Secondary | ICD-10-CM

## 2013-09-11 DIAGNOSIS — Z5189 Encounter for other specified aftercare: Secondary | ICD-10-CM

## 2013-09-11 DIAGNOSIS — C3410 Malignant neoplasm of upper lobe, unspecified bronchus or lung: Secondary | ICD-10-CM

## 2013-09-11 MED ORDER — OXYCODONE-ACETAMINOPHEN 5-325 MG PO TABS: 1.0000 | ORAL_TABLET | Freq: Four times a day (QID) | ORAL | Status: DC | PRN

## 2013-09-11 MED ORDER — PEGFILGRASTIM INJECTION 6 MG/0.6ML
6.0000 mg | Freq: Once | SUBCUTANEOUS | Status: AC
  Administered 2013-09-11: 6 mg via SUBCUTANEOUS
  Filled 2013-09-11: qty 0.6

## 2013-09-14 ENCOUNTER — Ambulatory Visit: Payer: Medicare Other

## 2013-09-15 ENCOUNTER — Observation Stay (HOSPITAL_COMMUNITY): Payer: Medicare Other

## 2013-09-15 ENCOUNTER — Inpatient Hospital Stay (HOSPITAL_COMMUNITY)
Admission: AD | Admit: 2013-09-15 | Discharge: 2013-09-21 | DRG: 314 | Disposition: A | Discharge status: Home Health | Payer: Medicare Other | Source: Ambulatory Visit | Attending: Internal Medicine | Admitting: Internal Medicine

## 2013-09-15 ENCOUNTER — Ambulatory Visit
Admission: RE | Admit: 2013-09-15 | Discharge: 2013-09-15 | Disposition: A | Discharge status: Home / Self Care | Payer: Medicare Other | Source: Ambulatory Visit | Attending: Radiation Oncology | Admitting: Radiation Oncology

## 2013-09-15 ENCOUNTER — Encounter (HOSPITAL_COMMUNITY): Payer: Self-pay | Admitting: Internal Medicine

## 2013-09-15 VITALS — BP 105/69 | HR 110 | Temp 98.1°F | Resp 20 | Wt 196.8 lb

## 2013-09-15 DIAGNOSIS — F39 Unspecified mood [affective] disorder: Secondary | ICD-10-CM

## 2013-09-15 DIAGNOSIS — R413 Other amnesia: Secondary | ICD-10-CM

## 2013-09-15 DIAGNOSIS — F4310 Post-traumatic stress disorder, unspecified: Secondary | ICD-10-CM

## 2013-09-15 DIAGNOSIS — R269 Unspecified abnormalities of gait and mobility: Secondary | ICD-10-CM

## 2013-09-15 DIAGNOSIS — R1310 Dysphagia, unspecified: Secondary | ICD-10-CM

## 2013-09-15 DIAGNOSIS — E785 Hyperlipidemia, unspecified: Secondary | ICD-10-CM

## 2013-09-15 DIAGNOSIS — R110 Nausea: Secondary | ICD-10-CM | POA: Diagnosis not present

## 2013-09-15 DIAGNOSIS — G893 Neoplasm related pain (acute) (chronic): Secondary | ICD-10-CM | POA: Diagnosis present

## 2013-09-15 DIAGNOSIS — E039 Hypothyroidism, unspecified: Secondary | ICD-10-CM | POA: Diagnosis present

## 2013-09-15 DIAGNOSIS — Z981 Arthrodesis status: Secondary | ICD-10-CM

## 2013-09-15 DIAGNOSIS — R233 Spontaneous ecchymoses: Secondary | ICD-10-CM

## 2013-09-15 DIAGNOSIS — K222 Esophageal obstruction: Secondary | ICD-10-CM

## 2013-09-15 DIAGNOSIS — R7989 Other specified abnormal findings of blood chemistry: Secondary | ICD-10-CM

## 2013-09-15 DIAGNOSIS — E860 Dehydration: Secondary | ICD-10-CM | POA: Diagnosis present

## 2013-09-15 DIAGNOSIS — K219 Gastro-esophageal reflux disease without esophagitis: Secondary | ICD-10-CM

## 2013-09-15 DIAGNOSIS — E46 Unspecified protein-calorie malnutrition: Secondary | ICD-10-CM

## 2013-09-15 DIAGNOSIS — F32A Depression, unspecified: Secondary | ICD-10-CM

## 2013-09-15 DIAGNOSIS — R778 Other specified abnormalities of plasma proteins: Secondary | ICD-10-CM

## 2013-09-15 DIAGNOSIS — Z8673 Personal history of transient ischemic attack (TIA), and cerebral infarction without residual deficits: Secondary | ICD-10-CM

## 2013-09-15 DIAGNOSIS — J438 Other emphysema: Secondary | ICD-10-CM

## 2013-09-15 DIAGNOSIS — J449 Chronic obstructive pulmonary disease, unspecified: Secondary | ICD-10-CM | POA: Diagnosis present

## 2013-09-15 DIAGNOSIS — M47812 Spondylosis without myelopathy or radiculopathy, cervical region: Secondary | ICD-10-CM

## 2013-09-15 DIAGNOSIS — I951 Orthostatic hypotension: Secondary | ICD-10-CM

## 2013-09-15 DIAGNOSIS — J4489 Other specified chronic obstructive pulmonary disease: Secondary | ICD-10-CM | POA: Diagnosis present

## 2013-09-15 DIAGNOSIS — F3161 Bipolar disorder, current episode mixed, mild: Secondary | ICD-10-CM

## 2013-09-15 DIAGNOSIS — I959 Hypotension, unspecified: Secondary | ICD-10-CM | POA: Diagnosis not present

## 2013-09-15 DIAGNOSIS — G8929 Other chronic pain: Secondary | ICD-10-CM | POA: Diagnosis not present

## 2013-09-15 DIAGNOSIS — F319 Bipolar disorder, unspecified: Secondary | ICD-10-CM | POA: Diagnosis present

## 2013-09-15 DIAGNOSIS — F329 Major depressive disorder, single episode, unspecified: Secondary | ICD-10-CM

## 2013-09-15 DIAGNOSIS — R600 Localized edema: Secondary | ICD-10-CM

## 2013-09-15 DIAGNOSIS — E038 Other specified hypothyroidism: Secondary | ICD-10-CM

## 2013-09-15 DIAGNOSIS — F3130 Bipolar disorder, current episode depressed, mild or moderate severity, unspecified: Secondary | ICD-10-CM | POA: Diagnosis not present

## 2013-09-15 DIAGNOSIS — C3490 Malignant neoplasm of unspecified part of unspecified bronchus or lung: Secondary | ICD-10-CM

## 2013-09-15 DIAGNOSIS — R609 Edema, unspecified: Secondary | ICD-10-CM

## 2013-09-15 DIAGNOSIS — F332 Major depressive disorder, recurrent severe without psychotic features: Secondary | ICD-10-CM

## 2013-09-15 DIAGNOSIS — R471 Dysarthria and anarthria: Secondary | ICD-10-CM

## 2013-09-15 DIAGNOSIS — R490 Dysphonia: Secondary | ICD-10-CM

## 2013-09-15 DIAGNOSIS — E2749 Other adrenocortical insufficiency: Secondary | ICD-10-CM | POA: Diagnosis present

## 2013-09-15 DIAGNOSIS — D649 Anemia, unspecified: Secondary | ICD-10-CM

## 2013-09-15 DIAGNOSIS — Z6829 Body mass index (BMI) 29.0-29.9, adult: Secondary | ICD-10-CM

## 2013-09-15 DIAGNOSIS — I10 Essential (primary) hypertension: Secondary | ICD-10-CM

## 2013-09-15 DIAGNOSIS — C3491 Malignant neoplasm of unspecified part of right bronchus or lung: Secondary | ICD-10-CM

## 2013-09-15 DIAGNOSIS — Z85820 Personal history of malignant melanoma of skin: Secondary | ICD-10-CM

## 2013-09-15 DIAGNOSIS — M549 Dorsalgia, unspecified: Secondary | ICD-10-CM

## 2013-09-15 DIAGNOSIS — Z9849 Cataract extraction status, unspecified eye: Secondary | ICD-10-CM

## 2013-09-15 DIAGNOSIS — R220 Localized swelling, mass and lump, head: Secondary | ICD-10-CM

## 2013-09-15 DIAGNOSIS — I6529 Occlusion and stenosis of unspecified carotid artery: Secondary | ICD-10-CM

## 2013-09-15 DIAGNOSIS — D61810 Antineoplastic chemotherapy induced pancytopenia: Secondary | ICD-10-CM

## 2013-09-15 DIAGNOSIS — Z961 Presence of intraocular lens: Secondary | ICD-10-CM

## 2013-09-15 DIAGNOSIS — Z720 Tobacco use: Secondary | ICD-10-CM

## 2013-09-15 DIAGNOSIS — T451X5A Adverse effect of antineoplastic and immunosuppressive drugs, initial encounter: Secondary | ICD-10-CM | POA: Diagnosis present

## 2013-09-15 DIAGNOSIS — F17200 Nicotine dependence, unspecified, uncomplicated: Secondary | ICD-10-CM | POA: Diagnosis present

## 2013-09-15 DIAGNOSIS — Z79899 Other long term (current) drug therapy: Secondary | ICD-10-CM

## 2013-09-15 DIAGNOSIS — E2740 Unspecified adrenocortical insufficiency: Secondary | ICD-10-CM

## 2013-09-15 DIAGNOSIS — F411 Generalized anxiety disorder: Secondary | ICD-10-CM | POA: Diagnosis present

## 2013-09-15 DIAGNOSIS — R918 Other nonspecific abnormal finding of lung field: Secondary | ICD-10-CM

## 2013-09-15 LAB — COMPREHENSIVE METABOLIC PANEL
ALBUMIN: 3 g/dL — AB (ref 3.5–5.2)
ALT: 8 U/L (ref 0–35)
AST: 10 U/L (ref 0–37)
Alkaline Phosphatase: 85 U/L (ref 39–117)
Anion gap: 10 (ref 5–15)
BUN: 15 mg/dL (ref 6–23)
CALCIUM: 8.9 mg/dL (ref 8.4–10.5)
CHLORIDE: 96 meq/L (ref 96–112)
CO2: 31 mEq/L (ref 19–32)
Creatinine, Ser: 0.82 mg/dL (ref 0.50–1.10)
GFR calc Af Amer: 90 mL/min — ABNORMAL LOW (ref >90)
GFR calc non Af Amer: 77 mL/min — ABNORMAL LOW (ref >90)
Glucose, Bld: 91 mg/dL (ref 70–99)
Potassium: 4.5 mEq/L (ref 3.7–5.3)
SODIUM: 137 meq/L (ref 137–147)
Total Protein: 6.3 g/dL (ref 6.0–8.3)

## 2013-09-15 LAB — URINE MICROSCOPIC-ADD ON

## 2013-09-15 LAB — CBC WITH DIFFERENTIAL/PLATELET
BASOS PCT: 1 % (ref 0–1)
Basophils Absolute: 0 10*3/uL (ref 0.0–0.1)
Eosinophils Absolute: 0 10*3/uL (ref 0.0–0.7)
Eosinophils Relative: 0 % (ref 0–5)
HEMATOCRIT: 36.1 % (ref 36.0–46.0)
Hemoglobin: 11.6 g/dL — ABNORMAL LOW (ref 12.0–15.0)
Lymphocytes Relative: 31 % (ref 12–46)
Lymphs Abs: 0.9 10*3/uL (ref 0.7–4.0)
MCH: 28.9 pg (ref 26.0–34.0)
MCHC: 32.1 g/dL (ref 30.0–36.0)
MCV: 90 fL (ref 78.0–100.0)
Monocytes Absolute: 0.1 10*3/uL (ref 0.1–1.0)
Monocytes Relative: 3 % (ref 3–12)
NEUTROS ABS: 1.8 10*3/uL (ref 1.7–7.7)
Neutrophils Relative %: 65 % (ref 43–77)
Platelets: 41 10*3/uL — ABNORMAL LOW (ref 150–400)
RBC: 4.01 MIL/uL (ref 3.87–5.11)
RDW: 14.5 % (ref 11.5–15.5)
WBC: 2.8 10*3/uL — AB (ref 4.0–10.5)

## 2013-09-15 LAB — URINALYSIS, ROUTINE W REFLEX MICROSCOPIC
Bilirubin Urine: NEGATIVE
GLUCOSE, UA: NEGATIVE mg/dL
HGB URINE DIPSTICK: NEGATIVE
Ketones, ur: NEGATIVE mg/dL
Leukocytes, UA: NEGATIVE
Nitrite: NEGATIVE
Protein, ur: 30 mg/dL — AB
Specific Gravity, Urine: 1.019 (ref 1.005–1.030)
Urobilinogen, UA: 0.2 mg/dL (ref 0.0–1.0)
pH: 6.5 (ref 5.0–8.0)

## 2013-09-15 LAB — LACTIC ACID, PLASMA: Lactic Acid, Venous: 0.2 mmol/L — ABNORMAL LOW (ref 0.5–2.2)

## 2013-09-15 MED ORDER — ALBUTEROL SULFATE (2.5 MG/3ML) 0.083% IN NEBU: 2.5000 mg | INHALATION_SOLUTION | Freq: Three times a day (TID) | RESPIRATORY_TRACT | Status: DC | PRN

## 2013-09-15 MED ORDER — ACETAMINOPHEN 650 MG RE SUPP: 650.0000 mg | Freq: Four times a day (QID) | RECTAL | Status: DC | PRN

## 2013-09-15 MED ORDER — MORPHINE SULFATE 4 MG/ML IJ SOLN
4.0000 mg | INTRAMUSCULAR | Status: DC | PRN
  Administered 2013-09-15 – 2013-09-16 (×2): 4 mg via INTRAVENOUS
  Filled 2013-09-15 (×2): qty 1

## 2013-09-15 MED ORDER — CLONAZEPAM 1 MG PO TABS
1.0000 mg | ORAL_TABLET | Freq: Four times a day (QID) | ORAL | Status: DC
  Administered 2013-09-15 – 2013-09-21 (×23): 1 mg via ORAL
  Filled 2013-09-15 (×24): qty 1

## 2013-09-15 MED ORDER — ACETAMINOPHEN 325 MG PO TABS: 650.0000 mg | ORAL_TABLET | Freq: Four times a day (QID) | ORAL | Status: DC | PRN

## 2013-09-15 MED ORDER — ONDANSETRON HCL 4 MG/2ML IJ SOLN
4.0000 mg | Freq: Four times a day (QID) | INTRAMUSCULAR | Status: DC | PRN
  Administered 2013-09-16 (×2): 4 mg via INTRAVENOUS
  Filled 2013-09-15 (×3): qty 2

## 2013-09-15 MED ORDER — ALUM & MAG HYDROXIDE-SIMETH 200-200-20 MG/5ML PO SUSP
30.0000 mL | Freq: Four times a day (QID) | ORAL | Status: DC | PRN
  Administered 2013-09-17 – 2013-09-18 (×2): 30 mL via ORAL
  Filled 2013-09-15 (×2): qty 30

## 2013-09-15 MED ORDER — EMOLLIENT BASE EX CREA: 1.0000 "application " | TOPICAL_CREAM | Freq: Two times a day (BID) | CUTANEOUS | Status: DC

## 2013-09-15 MED ORDER — SODIUM CHLORIDE 0.9 % IV SOLN
INTRAVENOUS | Status: DC
  Administered 2013-09-15 – 2013-09-18 (×7): via INTRAVENOUS

## 2013-09-15 MED ORDER — AMITRIPTYLINE HCL 100 MG PO TABS
100.0000 mg | ORAL_TABLET | Freq: Every day | ORAL | Status: DC
  Administered 2013-09-15 – 2013-09-20 (×6): 100 mg via ORAL
  Filled 2013-09-15 (×7): qty 1

## 2013-09-15 MED ORDER — DIVALPROEX SODIUM ER 500 MG PO TB24
500.0000 mg | ORAL_TABLET | Freq: Every day | ORAL | Status: DC
  Administered 2013-09-16 – 2013-09-21 (×6): 500 mg via ORAL
  Filled 2013-09-15 (×6): qty 1

## 2013-09-15 MED ORDER — DULOXETINE HCL 60 MG PO CPEP
60.0000 mg | ORAL_CAPSULE | Freq: Every morning | ORAL | Status: DC
  Administered 2013-09-16 – 2013-09-21 (×6): 60 mg via ORAL
  Filled 2013-09-15 (×6): qty 1

## 2013-09-15 MED ORDER — ALBUTEROL SULFATE HFA 108 (90 BASE) MCG/ACT IN AERS: 2.0000 | INHALATION_SPRAY | Freq: Three times a day (TID) | RESPIRATORY_TRACT | Status: DC | PRN

## 2013-09-15 MED ORDER — TIOTROPIUM BROMIDE MONOHYDRATE 18 MCG IN CAPS
18.0000 ug | ORAL_CAPSULE | Freq: Every day | RESPIRATORY_TRACT | Status: DC
  Administered 2013-09-16 – 2013-09-21 (×6): 18 ug via RESPIRATORY_TRACT
  Filled 2013-09-15 (×2): qty 5

## 2013-09-15 MED ORDER — LEVOTHYROXINE SODIUM 25 MCG PO TABS
25.0000 ug | ORAL_TABLET | Freq: Every day | ORAL | Status: DC
  Administered 2013-09-16 – 2013-09-21 (×6): 25 ug via ORAL
  Filled 2013-09-15 (×7): qty 1

## 2013-09-15 MED ORDER — BACLOFEN 5 MG HALF TABLET: 5.0000 mg | ORAL_TABLET | Freq: Every day | ORAL | Status: DC

## 2013-09-15 MED ORDER — DONEPEZIL HCL 5 MG PO TABS
5.0000 mg | ORAL_TABLET | Freq: Every day | ORAL | Status: DC
  Administered 2013-09-15 – 2013-09-20 (×6): 5 mg via ORAL
  Filled 2013-09-15 (×7): qty 1

## 2013-09-15 MED ORDER — TIOTROPIUM BROMIDE MONOHYDRATE 2.5 MCG/ACT IN AERS: 2.0000 | INHALATION_SPRAY | Freq: Every day | RESPIRATORY_TRACT | Status: DC

## 2013-09-15 MED ORDER — PANTOPRAZOLE SODIUM 40 MG PO TBEC
40.0000 mg | DELAYED_RELEASE_TABLET | Freq: Every day | ORAL | Status: DC
  Administered 2013-09-16 – 2013-09-21 (×6): 40 mg via ORAL
  Filled 2013-09-15 (×6): qty 1

## 2013-09-15 MED ORDER — SIMVASTATIN 20 MG PO TABS
20.0000 mg | ORAL_TABLET | Freq: Every day | ORAL | Status: DC
  Administered 2013-09-15 – 2013-09-20 (×6): 20 mg via ORAL
  Filled 2013-09-15 (×7): qty 1

## 2013-09-15 MED ORDER — ONDANSETRON HCL 4 MG PO TABS
4.0000 mg | ORAL_TABLET | Freq: Four times a day (QID) | ORAL | Status: DC | PRN
  Administered 2013-09-17: 4 mg via ORAL
  Filled 2013-09-15: qty 1

## 2013-09-15 MED ORDER — ENOXAPARIN SODIUM 40 MG/0.4ML ~~LOC~~ SOLN
40.0000 mg | SUBCUTANEOUS | Status: DC
  Administered 2013-09-15 – 2013-09-16 (×2): 40 mg via SUBCUTANEOUS
  Filled 2013-09-15 (×3): qty 0.4

## 2013-09-15 MED ORDER — FENTANYL 25 MCG/HR TD PT72
25.0000 ug | MEDICATED_PATCH | TRANSDERMAL | Status: DC
  Administered 2013-09-18 – 2013-09-21 (×2): 25 ug via TRANSDERMAL
  Filled 2013-09-15 (×2): qty 1

## 2013-09-15 MED ORDER — OXYCODONE-ACETAMINOPHEN 5-325 MG PO TABS
1.0000 | ORAL_TABLET | Freq: Four times a day (QID) | ORAL | Status: DC | PRN
  Administered 2013-09-16: 1 via ORAL
  Filled 2013-09-15: qty 1

## 2013-09-15 MED ORDER — PREDNISONE 5 MG PO TABS
7.5000 mg | ORAL_TABLET | Freq: Every day | ORAL | Status: DC
  Administered 2013-09-16 – 2013-09-21 (×6): 7.5 mg via ORAL
  Filled 2013-09-15 (×7): qty 1

## 2013-09-15 NOTE — H&P (Addendum)
History and Physical:    Tonya Sullivan:[id number] DOB: 1955-03-23 DOA: 09/15/2013  Referring physician: Dr. Pablo Ledger PCP: Vic Blackbird, MD   Chief Complaint: Nausea  History of Present Illness:   Tonya Sullivan is an 58 y.o. female with a PMH of limited stage small cell lung cancer, under the care of Dr. Julien Nordmann for systemic chemotherapy and Dr. Pablo Ledger for radiation therapy who was referred as a direct admission from Dr. Unknown Jim office. The patient presented with a 48 hour history of generalized weakness, anorexia and nausea. She felt so bad that she skipped her scheduled radiation treatment yesterday. When Dr. Pablo Ledger examined her today, she was found to be hypotensive in Dr. Pablo Ledger felt she needed fluids so she referred her as a direct admission. Upon presentation, the patient endorses anorexia with weight loss, fatigue, dry cough, and nausea without vomiting. She states she recently had diarrhea, but this has resolved. No recent blood work, and last chemotherapy was done 09/10/13 (etopside and Decadron) with Neulasta support given 09/11/13.  ROS:   Constitutional: No fever, no chills;  Appetite diminished; + weight loss, no weight gain, + fatigue.  HEENT: No blurry vision, no diplopia, no pharyngitis, no dysphagia CV: No chest pain, no palpitations, no PND, no orthopnea, no edema.  Resp: No SOB, + dry cough, no pleuritic pain. GI: + nausea, no vomiting, + recent diarrhea (none now), no melena, no hematochezia, no constipation, no abdominal pain.  GU: No dysuria, no hematuria, no frequency, no urgency. MSK: + myalgias, + arthralgias.  Neuro:  No headache, no focal neurological deficits, no history of seizures.  Psych: + depression, no anxiety.  Endo: No heat intolerance, no cold intolerance, no polyuria, + polydipsia  Skin: No rashes, no skin lesions.  Heme: + easy bruising.  Travel history: No recent travel.   Past Medical History:   Past Medical History  Diagnosis Date   . COPD (chronic obstructive pulmonary disease)   . Depression   . Chronic back pain     DDD, disc bulge, radiculopathy, spinal stenosis  . Hyperlipidemia   . Anxiety   . Bipolar disorder, unspecified   . Carotid artery calcification   . DDD (degenerative disc disease), lumbosacral   . Spinal stenosis, lumbar   . Autoimmune thyroiditis   . Essential hypertension, benign   . TIA (transient ischemic attack)     August 2014  . GERD (gastroesophageal reflux disease)   . Neuropathic pain   . Addison's disease     On Prednisone  . History of pneumonia   . Achalasia   . Melanoma   . Frequent falls   . DM (diabetes mellitus) with complications   . 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 Hypothyroidism     Past Surgical History:   Past Surgical History  Procedure Laterality Date  . Appendectomy    . Cholecystectomy    . Lump left breast      Benign  . Abdominal hysterectomy      Tubal pregnancy  . Inguinal hernia repair    . Stomach surgery    . Ectopic pregnancy surgery    . Back surgery      x 5;1984;1989;;1999;2000;2010  . Colonoscopy  01/25/2012    Procedure: COLONOSCOPY;  Surgeon: Rogene Houston, MD;  [facility];  Service: Endoscopy;  Laterality: N/A;  130  . Esophagogastroduodenoscopy (egd) with esophageal dilation N/A 08/13/2012    Procedure: ESOPHAGOGASTRODUODENOSCOPY (EGD) WITH ESOPHAGEAL DILATION;  Surgeon:  Rogene Houston, MD;  [facility];  Service: Endoscopy;  Laterality: N/A;  315  . Anterior cervical decomp/discectomy fusion N/A 10/21/2012    Procedure: Cervical Six-Seven Anterior cervical decompression/diskectomy/fusion;  Surgeon: Kristeen Miss, MD;  Location: [facility];  Service: Neurosurgery;  Laterality: N/A;  Cervical Six-Seven Anterior cervical decompression/diskectomy/fusion  . Cataract extraction w/phaco Right 11/25/2012    Procedure: CATARACT EXTRACTION PHACO AND INTRAOCULAR LENS PLACEMENT (IOC);  Surgeon: Elta Guadeloupe T. Gershon Crane,  MD;  Location: [facility];  Service: Ophthalmology;  Laterality: Right;  CDE:10.06  . Cataract extraction w/phaco Left 12/09/2012    Procedure: CATARACT EXTRACTION PHACO AND INTRAOCULAR LENS PLACEMENT (IOC);  Surgeon: Elta Guadeloupe T. Gershon Crane, MD;  Location: [facility];  Service: Ophthalmology;  Laterality: Left;  CDE:5.06  . Tonsillectomy    . Hiatal hernia repair    . Heller myotomy N/A 03/17/2013    Procedure: DIAGNOSTIC LAPAROSCOPY, LAPAROSCOPIC HELLER MYOTOMY, ENDOSCOPY, DOR FUNDOPLICATION;  Surgeon: Ralene Ok, MD;  Location: [facility];  Service: General;  Laterality: N/A;  . Breast surgery      Left breast  . Eye surgery    . Video bronchoscopy with endobronchial ultrasound N/A 07/24/2013    Procedure: VIDEO BRONCHOSCOPY WITH ENDOBRONCHIAL ULTRASOUND;  Surgeon: [name], MD;  Location: [facility];  Service: Thoracic;  Laterality: N/A;    Social History:   History   Social History  . Marital Status: Widowed    Spouse Name: N/A    Number of Children: 1  . Years of Education: N/A   Occupational History  . Diabled    Social History Main Topics  . Smoking status: Current Some Day Smoker -- 0.25 packs/day for 40 years    Types: Cigarettes, E-cigarettes  . Smokeless tobacco: Never Used     Comment: 5 cigs day 03/12/13, 08/12/13 currently 1 ppd  . Alcohol Use: No  . Drug Use: No  . Sexual Activity: No   Other Topics Concern  . Not on file   Social History Narrative   Widow.  Lives in [place] alone.  Has home health aide.    Family history:   Family History  Problem Relation Age of Onset  . Hypertension Mother   . Heart disease Mother   . Heart attack Mother   . Anxiety disorder Mother   . Lung cancer Father   . Heart attack Father   . Alcohol abuse Father   . Hypertension Brother   . Alcohol abuse Brother   . Bipolar disorder Brother   . ADD / ADHD Brother   . Drug abuse Brother   . OCD Brother   . Alcohol abuse Sister   . Bipolar disorder Sister   . Anxiety disorder  Sister   . ADD / ADHD Sister   . Drug abuse Sister   . Physical abuse Sister   . Sexual abuse Sister   . Alcohol abuse Brother   . Bipolar disorder Brother   . ADD / ADHD Brother   . Drug abuse Brother   . Alcohol abuse Sister   . Bipolar disorder Sister   . Anxiety disorder Sister   . ADD / ADHD Sister   . Physical abuse Sister   . Sexual abuse Sister   . Alcohol abuse Brother   . Bipolar disorder Brother   . ADD / ADHD Brother   . Bipolar disorder Brother   . ADD / ADHD Brother   . Alcohol abuse Brother   . Paranoid behavior Brother   .  Physical abuse Brother   . Sexual abuse Brother   . Bipolar disorder Maternal Aunt   . Bipolar disorder Paternal Aunt   . Bipolar disorder Maternal Uncle   . Bipolar disorder Paternal Uncle   . Bipolar disorder Maternal Grandfather   . Alcohol abuse Maternal Grandfather   . Bipolar disorder Maternal Grandmother   . Alcohol abuse Maternal Grandmother   . Anxiety disorder Maternal Grandmother   . Dementia Maternal Grandmother   . Bipolar disorder Paternal Grandfather   . Alcohol abuse Paternal Grandfather   . Bipolar disorder Paternal Grandmother   . Alcohol abuse Paternal Grandmother   . Drug abuse Paternal Grandmother   . Bipolar disorder Maternal Uncle   . Schizophrenia Neg Hx   . Seizures Neg Hx     Allergies   Codeine; Cyclobenzaprine; Darvocet; Abilify; Ace inhibitors; Adhesive; Gabapentin; Iodine; Levofloxacin; Prednisone; Sulfa antibiotics; and Robaxin  Current Medications:   Prior to Admission medications   Medication Sig Start Date End Date Taking? Authorizing Provider  albuterol (PROVENTIL HFA;VENTOLIN HFA) 108 (90 BASE) MCG/ACT inhaler Inhale 2 puffs into the lungs 3 (three) times daily as needed for wheezing or shortness of breath.    Historical Provider, MD  amitriptyline (ELAVIL) 100 MG tablet Take 1 tablet (100 mg total) by mouth at bedtime. 08/26/13   Levonne Spiller, MD  baclofen (LIORESAL) 10 MG tablet Take 5 mg by  mouth daily. 05/26/13   Nena Polio, PA-C  clonazePAM (KLONOPIN) 1 MG tablet Take 1 tablet (1 mg total) by mouth 4 (four) times daily. 08/26/13   Levonne Spiller, MD  dexlansoprazole (DEXILANT) 60 MG capsule Take 1 capsule (60 mg total) by mouth daily. 09/04/13   Butch Penny, NP  divalproex (DEPAKOTE ER) 500 MG 24 hr tablet Take 1 tablet (500 mg total) by mouth daily. 08/26/13   Levonne Spiller, MD  donepezil (ARICEPT) 5 MG tablet Take 1 tablet (5 mg total) by mouth at bedtime. 06/18/13   Alycia Rossetti, MD  DULoxetine (CYMBALTA) 60 MG capsule Take 1 capsule (60 mg total) by mouth every morning. 08/26/13   Levonne Spiller, MD  emollient (BIAFINE) cream Apply 1 application topically 2 (two) times daily. Apply to affected chest area after rad tx and bedtime, just not 4 hours prior to having rad txs, also use on the weekends 08/26/13   Thea Silversmith, MD  EPINEPHrine (EPIPEN) 0.3 mg/0.3 mL IJ SOAJ injection Inject 0.3 mLs (0.3 mg total) into the muscle once. PRN FOR SEVERE ALLERGIC REACTION 08/21/13   Rexene Alberts, MD  fentaNYL (DURAGESIC) 25 MCG/HR patch Place 1 patch (25 mcg total) onto the skin every 3 (three) days. 09/09/13   Alycia Rossetti, MD  levothyroxine (SYNTHROID, LEVOTHROID) 25 MCG tablet Take 25 mcg by mouth daily before breakfast.    Historical Provider, MD  lidocaine (LIDODERM) 5 % Place 1 patch onto the skin daily. Remove & Discard patch within 12 hours or as directed by MD    Historical Provider, MD  lidocaine-prilocaine (EMLA) cream Apply 1 application topically as needed. Apply to port 1 hr before chemo 08/18/13   Curt Bears, MD  metoprolol tartrate (LOPRESSOR) 25 MG tablet Take 12.5 mg by mouth 2 (two) times daily with a meal. 05/26/13   Nena Polio, PA-C  oxyCODONE-acetaminophen (PERCOCET/ROXICET) 5-325 MG per tablet Take 1 tablet by mouth every 6 (six) hours as needed for severe pain. 09/11/13   Carlton Adam, PA-C  pantoprazole (PROTONIX) 40 MG tablet Take 1 tablet (40 mg  total) by  mouth daily. 07/10/13   Alycia Rossetti, MD  pravastatin (PRAVACHOL) 40 MG tablet Take 1 tablet (40 mg total) by mouth every evening. 06/25/13   Alycia Rossetti, MD  predniSONE (DELTASONE) 2.5 MG tablet Take 3 tablets (7.5 mg total) by mouth daily with breakfast. 05/26/13   Nena Polio, PA-C  PRESCRIPTION MEDICATION etoposide (VEPESID) 250 mg in sodium chloride 0.9 % 650 mL chemo infusion 120 mg/m2  2.05 m2 (Treatment Plan Actual)  Once    Historical Provider, MD  PRESCRIPTION MEDICATION CISplatin (PLATINOL) 123 mg in sodium chloride 0.9 % 500 mL chemo infusion 60 mg/m2  2.05 m2 (Treatment Plan Actual)  Once    Historical Provider, MD  prochlorperazine (COMPAZINE) 10 MG tablet Take 1 tablet (10 mg total) by mouth every 6 (six) hours as needed for nausea or vomiting. 08/06/13   Curt Bears, MD  Tiotropium Bromide Monohydrate (SPIRIVA RESPIMAT) 2.5 MCG/ACT AERS Inhale 2 puffs into the lungs daily.    Historical Provider, MD    Physical Exam:   There were no vitals filed for this visit.   Physical Exam: There were no vitals taken for this visit. Gen: No acute distress. Head: Normocephalic, atraumatic. Hair loss/alopecia secondary to chemotherapy. Eyes: PERRL, EOMI, sclerae nonicteric. Mouth: Oropharynx clear with dentures in place. No mucositis. Neck: Supple, no thyromegaly, no lymphadenopathy, no jugular venous distention. Chest: Lungs diminished but clear bilaterally. CV: Heart sounds are regular. No murmurs, rubs, or gallops. Abdomen: Soft, nontender, nondistended with normal active bowel sounds. Extremities: Extremities are without clubbing, edema, or cyanosis. Skin: Warm and dry. Scattered ecchymosis. Neuro: Alert and oriented times 3; cranial nerves II through XII grossly intact. Psych: Mood and affect normal.   Data Review:    Labs: Basic Metabolic Panel:  Recent Labs Lab 09/09/13 0851  NA 137  K 4.9  CO2 26  GLUCOSE 103  BUN 13.5  CREATININE 0.8  CALCIUM 8.7    MG 2.5   Liver Function Tests:  Recent Labs Lab 09/09/13 0851  AST 26  ALT 23  ALKPHOS 83  BILITOT <0.20  PROT 6.4  ALBUMIN 2.9*   CBC:  Recent Labs Lab 09/09/13 0851  WBC 10.9*  NEUTROABS 9.3*  HGB 12.2  HCT 38.0  MCV 90.2  PLT 208    BNP (last 3 results)  Recent Labs  10/25/12 0930 08/20/13 0613  PROBNP 209.0* 1866.0*    Radiographic Studies: No results found.     Assessment/Plan:   Principal Problem:   Hypotension  Likely secondary to dehydration. Hold antihypertensives and gently hydrate.  If patient's blood pressure does not adequately respond to hydration, consider stress dose steroids as she has been extremely insufficient in the past.  Nontoxic-appearing, so doubt this is sepsis, but we'll check a lactic acid level.  Active Problems:   Nausea  Likely related to recent chemotherapy. Hydrate and monitor. Zofran when necessary.    Chronic pain  Continue home pain management medications.  Morphine as needed for pain not controlled by home medications or if needed for nausea.    COPD (chronic obstructive pulmonary disease)  Continue Spiriva and albuterol PRN.    Bipolar disorder  Continue Depakote, Klonopin, Aricept and Cymbalta.    GERD (gastroesophageal reflux disease)  Continue PPI therapy.    Small cell carcinoma of lung in a smoker  We'll notify Dr. Julien Nordmann of the patient's admission.  Check CBC given recent chemotherapy, counts likely suppressed.  Check portable chest x-ray given complaints of dry  cough and immunosuppression.  Tobacco cessation per nursing staff.    Unspecified protein-calorie malnutrition  Dietitian consultation requested.    DVT prophylaxis  Lovenox ordered.  Code Status: Full. Family Communication: Son Tonya Sullivan is emergency contact. Disposition Plan: Home when stable.  Time spent: One hour.  Tonya Sullivan [facility] Pager [phone] Cell: [phone]   If 7PM-7AM,  please contact night-coverage [email or web address] Password [facility] 09/15/2013, 5:28 PM

## 2013-09-15 NOTE — Progress Notes (Signed)
Weekly Management Note Current Dose:28 Gy  Projected Dose:60 Gy   Narrative:  The patient presents for routine under treatment assessment.  CBCT/MVCT images/Port film x-rays were reviewed.  The chart was checked. Feeling weak and dizzy. Nausea and vomiting continue in spite of compazine. Feelings of Pills getting studk in her throat. Nothing to eat or drink today. Unaccompanied in clinic. Chemo last week.   Physical Findings:  Appears week. Vitals noted below (orthostatic to 86/65 when standing)  Vitals:  Filed Vitals:   09/15/13 1530  BP: 105/69  Pulse: 110  Temp:   Resp:    Weight:  Wt Readings from Last 3 Encounters:  09/15/13 196 lb 12.8 oz (89.268 kg)  09/04/13 194 lb 1.6 oz (88.043 kg)  09/01/13 198 lb 1.6 oz (89.858 kg)   Lab Results  Component Value Date   WBC 10.9* 09/09/2013   HGB 12.2 09/09/2013   HCT 38.0 09/09/2013   MCV 90.2 09/09/2013   PLT 208 09/09/2013   Lab Results  Component Value Date   CREATININE 0.8 09/09/2013   BUN 13.5 09/09/2013   NA 137 09/09/2013   K 4.9 09/09/2013   CL 93* 08/27/2013   CO2 26 09/09/2013     Impression:  The patient is tolerating radiation.  Plan:  Continue treatment as planned. Admit for fluids/supportive care.

## 2013-09-16 ENCOUNTER — Other Ambulatory Visit: Payer: Self-pay

## 2013-09-16 ENCOUNTER — Ambulatory Visit
Admission: RE | Admit: 2013-09-16 | Discharge: 2013-09-16 | Disposition: A | Discharge status: Home / Self Care | Payer: Medicare Other | Source: Ambulatory Visit | Attending: Radiation Oncology | Admitting: Radiation Oncology

## 2013-09-16 DIAGNOSIS — Z6829 Body mass index (BMI) 29.0-29.9, adult: Secondary | ICD-10-CM | POA: Diagnosis not present

## 2013-09-16 DIAGNOSIS — F411 Generalized anxiety disorder: Secondary | ICD-10-CM | POA: Diagnosis present

## 2013-09-16 DIAGNOSIS — C3490 Malignant neoplasm of unspecified part of unspecified bronchus or lung: Secondary | ICD-10-CM | POA: Diagnosis present

## 2013-09-16 DIAGNOSIS — Z961 Presence of intraocular lens: Secondary | ICD-10-CM | POA: Diagnosis not present

## 2013-09-16 DIAGNOSIS — R5383 Other fatigue: Secondary | ICD-10-CM | POA: Diagnosis present

## 2013-09-16 DIAGNOSIS — K219 Gastro-esophageal reflux disease without esophagitis: Secondary | ICD-10-CM | POA: Diagnosis present

## 2013-09-16 DIAGNOSIS — Z8673 Personal history of transient ischemic attack (TIA), and cerebral infarction without residual deficits: Secondary | ICD-10-CM | POA: Diagnosis not present

## 2013-09-16 DIAGNOSIS — I10 Essential (primary) hypertension: Secondary | ICD-10-CM

## 2013-09-16 DIAGNOSIS — R5381 Other malaise: Secondary | ICD-10-CM | POA: Diagnosis present

## 2013-09-16 DIAGNOSIS — Z85820 Personal history of malignant melanoma of skin: Secondary | ICD-10-CM | POA: Diagnosis not present

## 2013-09-16 DIAGNOSIS — E46 Unspecified protein-calorie malnutrition: Secondary | ICD-10-CM | POA: Diagnosis present

## 2013-09-16 DIAGNOSIS — E039 Hypothyroidism, unspecified: Secondary | ICD-10-CM | POA: Diagnosis present

## 2013-09-16 DIAGNOSIS — Z79899 Other long term (current) drug therapy: Secondary | ICD-10-CM | POA: Diagnosis not present

## 2013-09-16 DIAGNOSIS — I959 Hypotension, unspecified: Secondary | ICD-10-CM | POA: Diagnosis present

## 2013-09-16 DIAGNOSIS — E2749 Other adrenocortical insufficiency: Secondary | ICD-10-CM

## 2013-09-16 DIAGNOSIS — Z981 Arthrodesis status: Secondary | ICD-10-CM | POA: Diagnosis not present

## 2013-09-16 DIAGNOSIS — Z9849 Cataract extraction status, unspecified eye: Secondary | ICD-10-CM | POA: Diagnosis not present

## 2013-09-16 DIAGNOSIS — T451X5A Adverse effect of antineoplastic and immunosuppressive drugs, initial encounter: Secondary | ICD-10-CM | POA: Diagnosis present

## 2013-09-16 DIAGNOSIS — F17200 Nicotine dependence, unspecified, uncomplicated: Secondary | ICD-10-CM | POA: Diagnosis present

## 2013-09-16 DIAGNOSIS — E860 Dehydration: Secondary | ICD-10-CM | POA: Diagnosis present

## 2013-09-16 DIAGNOSIS — F319 Bipolar disorder, unspecified: Secondary | ICD-10-CM | POA: Diagnosis present

## 2013-09-16 DIAGNOSIS — J449 Chronic obstructive pulmonary disease, unspecified: Secondary | ICD-10-CM | POA: Diagnosis present

## 2013-09-16 DIAGNOSIS — G893 Neoplasm related pain (acute) (chronic): Secondary | ICD-10-CM | POA: Diagnosis present

## 2013-09-16 DIAGNOSIS — E785 Hyperlipidemia, unspecified: Secondary | ICD-10-CM | POA: Diagnosis present

## 2013-09-16 DIAGNOSIS — D61810 Antineoplastic chemotherapy induced pancytopenia: Secondary | ICD-10-CM | POA: Diagnosis present

## 2013-09-16 LAB — CBC
HEMATOCRIT: 30.6 % — AB (ref 36.0–46.0)
HEMOGLOBIN: 9.9 g/dL — AB (ref 12.0–15.0)
MCH: 29.2 pg (ref 26.0–34.0)
MCHC: 32.4 g/dL (ref 30.0–36.0)
MCV: 90.3 fL (ref 78.0–100.0)
Platelets: 33 10*3/uL — ABNORMAL LOW (ref 150–400)
RBC: 3.39 MIL/uL — ABNORMAL LOW (ref 3.87–5.11)
RDW: 14.4 % (ref 11.5–15.5)
WBC: 2.1 10*3/uL — ABNORMAL LOW (ref 4.0–10.5)

## 2013-09-16 LAB — BASIC METABOLIC PANEL
ANION GAP: 7 (ref 5–15)
BUN: 16 mg/dL (ref 6–23)
CHLORIDE: 103 meq/L (ref 96–112)
CO2: 30 meq/L (ref 19–32)
CREATININE: 0.72 mg/dL (ref 0.50–1.10)
Calcium: 8.4 mg/dL (ref 8.4–10.5)
GFR calc Af Amer: >90 mL/min (ref >90)
GFR calc non Af Amer: >90 mL/min (ref >90)
Glucose, Bld: 91 mg/dL (ref 70–99)
Potassium: 4.4 mEq/L (ref 3.7–5.3)
Sodium: 140 mEq/L (ref 137–147)

## 2013-09-16 MED ORDER — OXYCODONE-ACETAMINOPHEN 5-325 MG PO TABS
1.0000 | ORAL_TABLET | Freq: Four times a day (QID) | ORAL | Status: DC | PRN
  Administered 2013-09-17 – 2013-09-19 (×4): 1 via ORAL
  Filled 2013-09-16 (×4): qty 1

## 2013-09-16 MED ORDER — ENSURE COMPLETE PO LIQD
237.0000 mL | ORAL | Status: DC
  Administered 2013-09-16 – 2013-09-20 (×5): 237 mL via ORAL

## 2013-09-16 MED ORDER — BENZONATATE 100 MG PO CAPS
100.0000 mg | ORAL_CAPSULE | Freq: Two times a day (BID) | ORAL | Status: DC
  Administered 2013-09-16 – 2013-09-17 (×3): 100 mg via ORAL
  Filled 2013-09-16 (×4): qty 1

## 2013-09-16 MED ORDER — LIP MEDEX EX OINT
TOPICAL_OINTMENT | CUTANEOUS | Status: DC | PRN
  Administered 2013-09-16: 11:00:00 via TOPICAL
  Filled 2013-09-16: qty 7

## 2013-09-16 MED ORDER — OXYCODONE-ACETAMINOPHEN 5-325 MG PO TABS: 1.0000 | ORAL_TABLET | Freq: Four times a day (QID) | ORAL | Status: DC | PRN

## 2013-09-16 MED ORDER — MORPHINE SULFATE 4 MG/ML IJ SOLN
4.0000 mg | INTRAMUSCULAR | Status: DC | PRN
  Administered 2013-09-16 – 2013-09-21 (×18): 4 mg via INTRAVENOUS
  Filled 2013-09-16 (×18): qty 1

## 2013-09-16 NOTE — Progress Notes (Signed)
INITIAL NUTRITION ASSESSMENT  DOCUMENTATION CODES Per approved criteria  -Not Applicable   INTERVENTION: -Recommend Ensure Complete once daily -Provided nutrition supplement coupons -Encouraged intake of soft high protein foods; reviewed ways to improve taste and supplement alternatives -RD to follow  NUTRITION DIAGNOSIS: Inadequate oral intake related to taste change/difficulty swallowing/nausea as evidenced by decreased PO intake.   Goal: Pt to meet >/= 90% of their estimated nutrition needs    Monitor:  Total protein/energy intake, labs, weights, swallow profile  Reason for Assessment: Consult to Assess/MST  58 y.o. female  Admitting Dx: Hypotension  ASSESSMENT: Tonya Sullivan is an 58 y.o. female with a PMH of limited stage small cell lung cancer, under the care of Dr. Julien Nordmann for systemic chemotherapy and Dr. Pablo Ledger for radiation therapy who was referred as a direct admission from Dr. Unknown Jim office. The patient presented with a 48 hour history of generalized weakness, anorexia and nausea  -Pt reported decreased appetite d/t feelings of nausea, and possible dysphagia where foods/liquids were becoming stuck in esophagus; which pt attributes to radiation -Diet recall indicates pt consuming soft foods-yogurt, hot cereals, Naked fruit drinks, softened proteins and Ensure once daily. -Endorsed some taste changes whereas foods were too sweet; reviewed ways to increase foods/supplement palatability. Provided pt with nutrition supplement coupons. -Was last seen by outpatient oncology RD on 08/2013. Pt denied change in appetite, but had been suffering with acid reflux. Was provided with one case of Ensure Complete -Weight has remained stable, usual body weight 195-200 lbs -Had not yet consumed meal during admit, RD to add yogurt with breakfast. Encouraged use of Greek yogurt for additional protein.  -Pt denied current nausea/vomiting  Height: Ht Readings from Last 1  Encounters:  09/15/13 5\' 9"  (1.753 m)    Weight: Wt Readings from Last 1 Encounters:  09/16/13 195 lb 12.8 oz (88.814 kg)    Ideal Body Weight: 145 lb   % Ideal Body Weight: 134%  Wt Readings from Last 10 Encounters:  09/16/13 195 lb 12.8 oz (88.814 kg)  09/15/13 196 lb 12.8 oz (89.268 kg)  09/04/13 194 lb 1.6 oz (88.043 kg)  09/01/13 198 lb 1.6 oz (89.858 kg)  08/26/13 191 lb 11.2 oz (86.955 kg)  08/26/13 191 lb 9.6 oz (86.909 kg)  08/25/13 195 lb 6.4 oz (88.633 kg)  08/21/13 203 lb 0.7 oz (92.1 kg)  08/13/13 192 lb (87.091 kg)  08/06/13 196 lb 8 oz (89.132 kg)    Usual Body Weight: 195-200 lb  % Usual Body Weight: 100%  BMI:  Body mass index is 28.9 kg/(m^2).  Estimated Nutritional Needs: Kcal: 1900-2100 Protein: 90-100 gram Fluid: >/= 1900 ml/daily  Skin: WDL  Diet Order: General  EDUCATION NEEDS: -Education needs addressed   Intake/Output Summary (Last 24 hours) at 09/16/13 1046 Last data filed at 09/16/13 0618  Gross per 24 hour  Intake 1193.4 ml  Output      0 ml  Net 1193.4 ml    Last BM: 9/14   Labs:   Recent Labs Lab 09/15/13 1856 09/16/13 0610  NA 137 140  K 4.5 4.4  CL 96 103  CO2 31 30  BUN 15 16  CREATININE 0.82 0.72  CALCIUM 8.9 8.4  GLUCOSE 91 91    CBG (last 3)  No results found for this basename: GLUCAP,  in the last 72 hours  Scheduled Meds: . amitriptyline  100 mg Oral QHS  . benzonatate  100 mg Oral BID  . clonazePAM  1 mg  Oral QID  . divalproex  500 mg Oral Daily  . donepezil  5 mg Oral QHS  . DULoxetine  60 mg Oral q morning - 10a  . enoxaparin (LOVENOX) injection  40 mg Subcutaneous Q24H  . feeding supplement (ENSURE COMPLETE)  237 mL Oral Q24H  . [START ON 09/18/2013] fentaNYL  25 mcg Transdermal Q72H  . levothyroxine  25 mcg Oral QAC breakfast  . pantoprazole  40 mg Oral Daily  . predniSONE  7.5 mg Oral Q breakfast  . simvastatin  20 mg Oral q1800  . tiotropium  18 mcg Inhalation Daily    Continuous  Infusions: . sodium chloride 100 mL/hr at 09/16/13 5573    Past Medical History  Diagnosis Date  . COPD (chronic obstructive pulmonary disease)   . Depression   . Chronic back pain     DDD, disc bulge, radiculopathy, spinal stenosis  . Hyperlipidemia   . Anxiety   . Bipolar disorder, unspecified   . Carotid artery calcification   . DDD (degenerative disc disease), lumbosacral   . Spinal stenosis, lumbar   . Autoimmune thyroiditis   . Essential hypertension, benign   . TIA (transient ischemic attack)     August 2014  . GERD (gastroesophageal reflux disease)   . Neuropathic pain   . Addison's disease     On Prednisone  . History of pneumonia   . Achalasia   . Melanoma   . Frequent falls   . DM (diabetes mellitus) with complications   . 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 Hypothyroidism     Past Surgical History  Procedure Laterality Date  . Appendectomy    . Cholecystectomy    . Lump left breast      Benign  . Abdominal hysterectomy      Tubal pregnancy  . Inguinal hernia repair    . Stomach surgery    . Ectopic pregnancy surgery    . Back surgery      x 5;1984;1989;;1999;2000;2010  . Colonoscopy  01/25/2012    Procedure: COLONOSCOPY;  Surgeon: Rogene Houston, MD;  [facility];  Service: Endoscopy;  Laterality: N/A;  130  . Esophagogastroduodenoscopy (egd) with esophageal dilation N/A 08/13/2012    Procedure: ESOPHAGOGASTRODUODENOSCOPY (EGD) WITH ESOPHAGEAL DILATION;  Surgeon: Rogene Houston, MD;  [facility];  Service: Endoscopy;  Laterality: N/A;  315  . Anterior cervical decomp/discectomy fusion N/A 10/21/2012    Procedure: Cervical Six-Seven Anterior cervical decompression/diskectomy/fusion;  Surgeon: Kristeen Miss, MD;  Location: [facility];  Service: Neurosurgery;  Laterality: N/A;  Cervical Six-Seven Anterior cervical decompression/diskectomy/fusion  . Cataract extraction w/phaco Right 11/25/2012    Procedure: CATARACT EXTRACTION  PHACO AND INTRAOCULAR LENS PLACEMENT (IOC);  Surgeon: Elta Guadeloupe T. Gershon Crane, MD;  Location: [facility];  Service: Ophthalmology;  Laterality: Right;  CDE:10.06  . Cataract extraction w/phaco Left 12/09/2012    Procedure: CATARACT EXTRACTION PHACO AND INTRAOCULAR LENS PLACEMENT (IOC);  Surgeon: Elta Guadeloupe T. Gershon Crane, MD;  Location: [facility];  Service: Ophthalmology;  Laterality: Left;  CDE:5.06  . Tonsillectomy    . Hiatal hernia repair    . Heller myotomy N/A 03/17/2013    Procedure: DIAGNOSTIC LAPAROSCOPY, LAPAROSCOPIC HELLER MYOTOMY, ENDOSCOPY, DOR FUNDOPLICATION;  Surgeon: Ralene Ok, MD;  Location: [facility];  Service: General;  Laterality: N/A;  . Breast surgery      Left breast  . Eye surgery    . Video bronchoscopy with endobronchial ultrasound N/A 07/24/2013    Procedure: VIDEO  BRONCHOSCOPY WITH ENDOBRONCHIAL ULTRASOUND;  Surgeon: [name], MD;  Location: [facility];  Service: Thoracic;  Laterality: N/A;    Atlee Abide MS RD LDN Clinical Dietitian [phone]

## 2013-09-16 NOTE — Progress Notes (Signed)
Utilization Review Completed.Donne Anon T9/16/2015

## 2013-09-16 NOTE — Progress Notes (Addendum)
Patient ID: Tonya Sullivan, female   DOB: 1955/04/25, 58 y.o.   MRN: [id number] [facility] PROGRESS NOTE  DONISHA HOCH PFX:[id number] DOB: 1955-11-24 DOA: 09/15/2013 PCP: Vic Blackbird, MD  Brief narrative: 58 y.o. female with a PMH of limited stage small cell lung cancer, under the care of Dr. Julien Nordmann for systemic chemotherapy and Dr. Pablo Ledger for radiation therapy. Patient was found to be hypotensive in Dr. Pablo Ledger office and so she referred her as a direct admission. The patient complained of generalized weakness, anorexia and nausea for past few days prior to this admission. She recently had diarrhea but this has now resolved. Last chemotherapy was 09/10/13 (etopside and Decadron) with Neulasta support given 09/11/13.   Assessment/Plan:   Principal Problem:  Hypotension, nausea Secondary to dehydration, GI losses, side effected for chemotherapy  Continue IV fluids Supportive care with antiemetics. If patient's blood pressure does not adequately respond to hydration, may need stress dose steroids. Currently on low dose prednisone 7.5 mg daily.  Active Problems:  Drug induced (chemotherapy) pancytopenia  Monitor CBC daily. WBC count 2.8, Hgb 11.6 and platelet count 41. Chronic cancer related pain  Has fentanyl patch; order also placed for morphine 4 mg every 3 hours IV PRN severe pain; oxycodone PO PRN moderate pain COPD (chronic obstructive pulmonary disease)  Continue Spiriva and albuterol PRN. Hypothyroidism  Continue synthroid  Dyslipidemia  Continue statin therapy  Bipolar disorder  Continue Depakote, Klonopin, Aricept, amitriptyline and Cymbalta. GERD (gastroesophageal reflux disease)  Continue PPI therapy. Small cell carcinoma of lung in a smoker  No acute cardiopulmonary findings on CXR. Unspecified protein-calorie malnutrition  Nutrition consulted    DVT Prophylaxis   Stop Lovenox and use SCDs because of thrombocytopenia  Code Status: Full.  Family  Communication:  plan of care discussed with the patient Disposition Plan: Home when stable.    IV Access:   Peripheral IV PAC Procedures and diagnostic studies:    Portable Chest 1 View 09/15/2013   Stable right lung mass.  Right internal jugular vein Port-A-Cath placed with its tip at the cavoatrial junction.    Medical Consultants:   None  Other Consultants:   None  Anti-Infectives:   None    Leisa Lenz, MD  [facility] Pager [phone]  If 7PM-7AM, please contact night-coverage [email or web address] Password [email or web address] 09/16/2013, 4:04 PM   LOS: 1 day    HPI/Subjective: No acute overnight events.  Objective: Filed Vitals:   09/15/13 2106 09/16/13 0616 09/16/13 0918 09/16/13 1434  BP: 112/63 103/70  104/58  Pulse: 92 99  99  Temp: 98.5 F (36.9 C) 97.8 F (36.6 C)  97.6 F (36.4 C)  TempSrc: Oral Oral  Oral  Resp: 16 18  18   Height:      Weight:  88.814 kg (195 lb 12.8 oz)    SpO2: 98% 99% 95% 96%    Intake/Output Summary (Last 24 hours) at 09/16/13 1604 Last data filed at 09/16/13 0618  Gross per 24 hour  Intake 1193.4 ml  Output      0 ml  Net 1193.4 ml    Exam:   General:  Pt is alert, follows commands appropriately, not in acute distress  Cardiovascular: Regular rate and rhythm, S1/S2, no murmurs  Respiratory: Clear to auscultation bilaterally, no wheezing, no crackles  Abdomen: Soft, non tender, non distended, bowel sounds present  Extremities: No edema, pulses DP and PT palpable bilaterally  Neuro: Grossly nonfocal  Data Reviewed: Basic Metabolic Panel:  Recent Labs Lab 09/15/13  1856 09/16/13 0610  NA 137 140  K 4.5 4.4  CL 96 103  CO2 31 30  GLUCOSE 91 91  BUN 15 16  CREATININE 0.82 0.72  CALCIUM 8.9 8.4   Liver Function Tests:  Recent Labs Lab 09/15/13 1856  AST 10  ALT 8  ALKPHOS 85  BILITOT <0.2*  PROT 6.3  ALBUMIN 3.0*   No results found for this basename: LIPASE, AMYLASE,  in the last 168 hours No results found  for this basename: AMMONIA,  in the last 168 hours CBC:  Recent Labs Lab 09/15/13 1856 09/16/13 0610  WBC 2.8* 2.1*  NEUTROABS 1.8  --   HGB 11.6* 9.9*  HCT 36.1 30.6*  MCV 90.0 90.3  PLT 41* 33*   Cardiac Enzymes: No results found for this basename: CKTOTAL, CKMB, CKMBINDEX, TROPONINI,  in the last 168 hours BNP: No components found with this basename: POCBNP,  CBG: No results found for this basename: GLUCAP,  in the last 168 hours  No results found for this or any previous visit (from the past 240 hour(s)).   Scheduled Meds: . amitriptyline  100 mg Oral QHS  . benzonatate  100 mg Oral BID  . clonazePAM  1 mg Oral QID  . divalproex  500 mg Oral Daily  . donepezil  5 mg Oral QHS  . DULoxetine  60 mg Oral q morning - 10a  . enoxaparin (LOVENOX)   40 mg Subcutaneous Q24H  . feeding supplement (ENSURE COMPLETE)  237 mL Oral Q24H  . [START ON 09/18/2013] fentaNYL  25 mcg Transdermal Q72H  . levothyroxine  25 mcg Oral QAC breakfast  . pantoprazole  40 mg Oral Daily  . predniSONE  7.5 mg Oral Q breakfast  . simvastatin  20 mg Oral q1800  . tiotropium  18 mcg Inhalation Daily   Continuous Infusions: . sodium chloride 100 mL/hr at 09/16/13 1311

## 2013-09-16 NOTE — Care Management Note (Signed)
CARE MANAGEMENT NOTE 09/16/2013  Patient:  KOBY, HARTFIELD   Account Number:  [id number]  Date Initiated:  09/16/2013  Documentation initiated by:  Leafy Kindle  Subjective/Objective Assessment:   58 yo admitted with hypotension.  Hx of small cell lung CA.     Action/Plan:   From home alone.   Anticipated DC Date:  09/21/2013   Anticipated DC Plan:  [place]  CM consult      Choice offered to / List presented to:             Status of service:  In process, will continue to follow Medicare Important Message given?   (If response is "NO", the following Medicare IM given date fields will be blank) Date Medicare IM given:   Medicare IM given by:   Date Additional Medicare IM given:   Additional Medicare IM given by:    Discharge Disposition:    Per UR Regulation:  Reviewed for med. necessity/level of care/duration of stay  If discussed at [facility] of Stay Meetings, dates discussed:    Comments:  09/16/13 Marney Doctor RN,BSN,NCM Pt from home alone.  Pt states that she has an aide from [employer] 2 hours a day and has a lot of family and friend support.  CM will continue to monitor for DC needs.

## 2013-09-17 ENCOUNTER — Ambulatory Visit
Admission: RE | Admit: 2013-09-17 | Discharge: 2013-09-17 | Disposition: A | Discharge status: Home / Self Care | Payer: Medicare Other | Source: Ambulatory Visit | Attending: Radiation Oncology | Admitting: Radiation Oncology

## 2013-09-17 DIAGNOSIS — T451X5A Adverse effect of antineoplastic and immunosuppressive drugs, initial encounter: Secondary | ICD-10-CM

## 2013-09-17 DIAGNOSIS — D61810 Antineoplastic chemotherapy induced pancytopenia: Secondary | ICD-10-CM

## 2013-09-17 LAB — CBC
HEMATOCRIT: 28.8 % — AB (ref 36.0–46.0)
Hemoglobin: 9.4 g/dL — ABNORMAL LOW (ref 12.0–15.0)
MCH: 29.2 pg (ref 26.0–34.0)
MCHC: 32.6 g/dL (ref 30.0–36.0)
MCV: 89.4 fL (ref 78.0–100.0)
Platelets: 16 10*3/uL — CL (ref 150–400)
RBC: 3.22 MIL/uL — ABNORMAL LOW (ref 3.87–5.11)
RDW: 14.1 % (ref 11.5–15.5)
WBC: 0.6 10*3/uL — CL (ref 4.0–10.5)

## 2013-09-17 MED ORDER — TBO-FILGRASTIM 480 MCG/0.8ML ~~LOC~~ SOSY
480.0000 ug | PREFILLED_SYRINGE | Freq: Every day | SUBCUTANEOUS | Status: DC
  Administered 2013-09-17 – 2013-09-19 (×3): 480 ug via SUBCUTANEOUS
  Filled 2013-09-17 (×5): qty 0.8

## 2013-09-17 MED ORDER — BENZONATATE 100 MG PO CAPS
200.0000 mg | ORAL_CAPSULE | Freq: Two times a day (BID) | ORAL | Status: DC
  Administered 2013-09-17 – 2013-09-21 (×8): 200 mg via ORAL
  Filled 2013-09-17 (×11): qty 2

## 2013-09-17 NOTE — Progress Notes (Signed)
Patient ID: Tonya Sullivan, female   DOB: 25-Jun-1955, 58 y.o.   MRN: [id number] [facility] PROGRESS NOTE  AMIRIA ORRISON VEL:[id number] DOB: 1955/09/18 DOA: 09-21-2013 PCP: Vic Blackbird, MD  Brief narrative: 58 y.o. female with a PMH of limited stage small cell lung cancer, under the care of Dr. Julien Nordmann for systemic chemotherapy and Dr. Pablo Ledger for radiation therapy. Patient was found to be hypotensive in Dr. Pablo Ledger office and so she referred her as a direct admission. The patient complained of generalized weakness, anorexia and nausea for past few days prior to this admission. She recently had diarrhea but this has now resolved. Last chemotherapy was 09/10/13 (etopside and Decadron) with Neulasta support given 09/11/13.   Assessment/Plan:   Principal Problem:  Hypotension, nausea  Likely secondary to GI losses and related dehydration versus side effects of chemotherapy. Patient feels improvement in her nausea with antiemetics. Continue IV fluids until by mouth intake improves. Blood pressure this morning 103/76. Active Problems:  Drug induced (chemotherapy) pancytopenia   Monitor CBC daily. WBC count 2.8, Hgb 11.6 and platelet count 41. CBC for this morning is pending. We'll followup on results. Chronic cancer related pain  Continue fentanyl patch and morphine 4 mg every 3 hours IV PRN severe pain; oxycodone PO PRN moderate pain COPD (chronic obstructive pulmonary disease)  Continue Spiriva and albuterol PRN. Hypothyroidism   Continue synthroid  Dyslipidemia   Continue statin therapy  Bipolar disorder  Continue Depakote, Klonopin, Aricept, amitriptyline and Cymbalta. GERD (gastroesophageal reflux disease)  Continue PPI therapy. Small cell carcinoma of lung in a smoker  No acute cardiopulmonary findings on CXR. Radiation therapy daily. Unspecified protein-calorie malnutrition  Nutrition consulted  DVT Prophylaxis  Stop Lovenox and use SCDs bilaterally due to  thrombocytopenia.  Code Status: Full.  Family Communication: plan of care discussed with the patient  Disposition Plan: Home when stable.   IV Access:   Peripheral IV  PAC Procedures and diagnostic studies:   Portable Chest 1 View 2013/09/21 Stable right lung mass. Right internal jugular vein Port-A-Cath placed with its tip at the cavoatrial junction.  Medical Consultants:   None  Other Consultants:   None  Anti-Infectives:   None    Leisa Lenz, MD  [facility] Pager [phone]  If 7PM-7AM, please contact night-coverage [email or web address] Password [email or web address] 09/17/2013, 11:55 AM   LOS: 2 days    HPI/Subjective: No acute overnight events.  Objective: Filed Vitals:   09/16/13 2124 09/17/13 0500 09/17/13 0611 09/17/13 0846  BP: 119/65  103/76   Pulse: 91  89   Temp: 98.7 F (37.1 C)  98.1 F (36.7 C)   TempSrc: Oral  Oral   Resp: 16  18   Height:      Weight:  90.2 kg (198 lb 13.7 oz)    SpO2: 96%  99% 95%    Intake/Output Summary (Last 24 hours) at 09/17/13 1155 Last data filed at 09/17/13 8242  Gross per 24 hour  Intake    480 ml  Output   1100 ml  Net   -620 ml    Exam:   General:  Pt is alert, no distress  Cardiovascular: Regular rate and rhythm, S1/S2 appreciated  Respiratory: Bilateral air entry, no wheezing  Abdomen: Soft, non tender, non distended, bowel sounds present  Extremities: No edema, pulses DP and PT palpable bilaterally  Neuro: Nonfocal  Data Reviewed: Basic Metabolic Panel:  Recent Labs Lab 09/21/2013 1856 09/16/13 0610  NA 137 140  K 4.5 4.4  CL 96 103  CO2 31 30  GLUCOSE 91 91  BUN 15 16  CREATININE 0.82 0.72  CALCIUM 8.9 8.4   Liver Function Tests:  Recent Labs Lab 09/15/13 1856  AST 10  ALT 8  ALKPHOS 85  BILITOT <0.2*  PROT 6.3  ALBUMIN 3.0*   No results found for this basename: LIPASE, AMYLASE,  in the last 168 hours No results found for this basename: AMMONIA,  in the last 168 hours CBC:  Recent  Labs Lab 09/15/13 1856 09/16/13 0610  WBC 2.8* 2.1*  NEUTROABS 1.8  --   HGB 11.6* 9.9*  HCT 36.1 30.6*  MCV 90.0 90.3  PLT 41* 33*   Cardiac Enzymes: No results found for this basename: CKTOTAL, CKMB, CKMBINDEX, TROPONINI,  in the last 168 hours BNP: No components found with this basename: POCBNP,  CBG: No results found for this basename: GLUCAP,  in the last 168 hours  No results found for this or any previous visit (from the past 240 hour(s)).   Scheduled Meds: . amitriptyline  100 mg Oral QHS  . benzonatate  100 mg Oral BID  . clonazePAM  1 mg Oral QID  . divalproex  500 mg Oral Daily  . donepezil  5 mg Oral QHS  . DULoxetine  60 mg Oral q morning - 10a  . feeding supplement (ENSURE COMPLETE)  237 mL Oral Q24H  . [START ON 09/18/2013] fentaNYL  25 mcg Transdermal Q72H  . levothyroxine  25 mcg Oral QAC breakfast  . pantoprazole  40 mg Oral Daily  . predniSONE  7.5 mg Oral Q breakfast  . simvastatin  20 mg Oral q1800  . tiotropium  18 mcg Inhalation Daily   Continuous Infusions: . sodium chloride 100 mL/hr at 09/17/13 [phone]

## 2013-09-17 NOTE — Progress Notes (Signed)
CRITICAL VALUE ALERT  Critical value received:  WBC 0.6, Plt- 16  Date of notification:  09/17/13  Time of notification:  3833  Critical value read back:Yes.    Nurse who received alert:  Laural Benes, RN  MD notified (1st page):  Dr. Charlies Silvers  Time of first page:  1300  MD notified (2nd page):  Time of second page:  Responding MD:  Dr. Charlies Silvers  Time MD responded:  [phone]

## 2013-09-17 NOTE — Progress Notes (Signed)
Patient active with [facility]. Patient has been followed for disease management and medication management. Patient has been undergoing radiation treatments. Number of admissions noted in EPIC are not indicative of actual inpatient admits. She will continue to be followed by [facility]. She could also possibly benefit from home health PT if recommended and ordered as patient endorses she is still pretty weak. Spoke with inpatient RNCM to make aware [facility] is following. Met with patient at bedside to make aware that [facility] will continue to follow post hospital discharge. Left contact information at bedside.  Marthenia Rolling, MSN- [facility] [phone]

## 2013-09-18 ENCOUNTER — Ambulatory Visit
Admission: RE | Admit: 2013-09-18 | Discharge: 2013-09-18 | Disposition: A | Discharge status: Home / Self Care | Payer: Medicare Other | Source: Ambulatory Visit | Attending: Radiation Oncology | Admitting: Radiation Oncology

## 2013-09-18 LAB — CBC
HCT: 28 % — ABNORMAL LOW (ref 36.0–46.0)
Hemoglobin: 9.4 g/dL — ABNORMAL LOW (ref 12.0–15.0)
MCH: 29.7 pg (ref 26.0–34.0)
MCHC: 33.6 g/dL (ref 30.0–36.0)
MCV: 88.3 fL (ref 78.0–100.0)
PLATELETS: 10 10*3/uL — AB (ref 150–400)
RBC: 3.17 MIL/uL — ABNORMAL LOW (ref 3.87–5.11)
RDW: 13.8 % (ref 11.5–15.5)
WBC: 0.8 10*3/uL — CL (ref 4.0–10.5)

## 2013-09-18 LAB — ABO/RH: ABO/RH(D): A POS

## 2013-09-18 MED ORDER — GUAIFENESIN-DM 100-10 MG/5ML PO SYRP
5.0000 mL | ORAL_SOLUTION | ORAL | Status: DC | PRN
  Administered 2013-09-18 – 2013-09-19 (×3): 5 mL via ORAL
  Filled 2013-09-18 (×3): qty 10

## 2013-09-18 MED ORDER — SODIUM CHLORIDE 0.9 % IV SOLN: Freq: Once | INTRAVENOUS | Status: DC

## 2013-09-18 NOTE — Progress Notes (Signed)
Patient ID: Tonya Sullivan, female   DOB: [birth year], 58 y.o.   MRN: [id number] [facility] PROGRESS NOTE  Tonya Sullivan [id number]:[id number] DOB: Jun 16, 1955 DOA: Oct 13, 2013 PCP: Vic Blackbird, MD  Brief narrative: 58 y.o. female with a PMH of limited stage small cell lung cancer, under the care of Dr. Julien Nordmann for systemic chemotherapy and Dr. Pablo Ledger for radiation therapy. Patient was found to be hypotensive in Dr. Pablo Ledger office and so she referred her as a direct admission. The patient complained of generalized weakness, anorexia and nausea for past few days prior to this admission. She recently had diarrhea but this has now resolved. Last chemotherapy was 09/10/13 (etopside and Decadron) with Neulasta support given 09/11/13.   Assessment/Plan:   Principal Problem:  Hypotension, nausea   Likely secondary to GI losses and related dehydration versus side effects of chemotherapy. Blood pressure is 121/70. Patient feels little bit better this morning.  Nausea improved with antiemetics as needed.  Will continue IV fluids until her by mouth intake improves.  Active Problems:  Drug induced (chemotherapy) pancytopenia   CBC on 09/17/2013 revealed white blood cell count 0.6, hemoglobin 9.4 and platelet count of 16. Patient was started on Neupogen 09/17/2013.  We will continue to monitor hemoglobin and platelets. We will transfuse platelets if less than 10. No evidence of acute bleed.  Appreciate oncology consult for recommendations. Chronic cancer related pain   Continue fentanyl patch and morphine 4 mg every 3 hours IV PRN severe pain; oxycodone PO PRN moderate pain COPD (chronic obstructive pulmonary disease)  Continue Spiriva and albuterol PRN. Hypothyroidism   Continue synthroid  Dyslipidemia   Continue statin therapy  Bipolar disorder   Continue Depakote, Klonopin, Aricept, amitriptyline and Cymbalta. GERD (gastroesophageal reflux disease)   Continue PPI therapy. Small  cell carcinoma of lung in a smoker   No acute cardiopulmonary findings on CXR.   Radiation therapy daily. Unspecified protein-calorie malnutrition   Nutrition consulted. Order placed for nutritional supplements. DVT Prophylaxis   Stopped Lovenox and using SCDs bilaterally due to thrombocytopenia.   Code Status: Full.  Family Communication: plan of care discussed with the patient  Disposition Plan: Home when stable.    IV Access:   Peripheral IV  PAC Procedures and diagnostic studies:   Portable Chest 1 View 2013-10-13 Stable right lung mass. Right internal jugular vein Port-A-Cath placed with its tip at the cavoatrial junction.  Medical Consultants:   Oncology  Other Consultants:   None  Anti-Infectives:   None    Leisa Lenz, MD  [facility] Pager [phone]  If 7PM-7AM, please contact night-coverage [email or web address] Password [email or web address] 09/18/2013, 8:55 AM   LOS: 3 days    HPI/Subjective: No acute overnight events.  Objective: Filed Vitals:   09/17/13 1400 09/17/13 2123 09/18/13 0500 09/18/13 0651  BP: 110/72 131/76  127/70  Pulse: 90 97  90  Temp: 98.2 F (36.8 C) 98.6 F (37 C)  98.4 F (36.9 C)  TempSrc: Oral Oral  Oral  Resp: 18 20  16   Height:      Weight:   91.173 kg (201 lb)   SpO2: 95% 97%  97%    Intake/Output Summary (Last 24 hours) at 09/18/13 0855 Last data filed at 09/18/13 0101  Gross per 24 hour  Intake    820 ml  Output   1800 ml  Net   -980 ml    Exam:   General:  Pt is alert,  not in acute distress  Cardiovascular: Regular rate and  rhythm, S1/S2, no murmurs  Respiratory: Clear to auscultation bilaterally, no wheezing, no crackles, no rhonchi  Abdomen: Soft, non tender, non distended, bowel sounds present  Extremities: No edema, pulses DP and PT palpable bilaterally  Neuro: Grossly nonfocal  Data Reviewed: Basic Metabolic Panel:  Recent Labs Lab 09/15/13 1856 09/16/13 0610  NA 137 140  K 4.5 4.4  CL 96 103   CO2 31 30  GLUCOSE 91 91  BUN 15 16  CREATININE 0.82 0.72  CALCIUM 8.9 8.4   Liver Function Tests:  Recent Labs Lab 09/15/13 1856  AST 10  ALT 8  ALKPHOS 85  BILITOT <0.2*  PROT 6.3  ALBUMIN 3.0*   No results found for this basename: LIPASE, AMYLASE,  in the last 168 hours No results found for this basename: AMMONIA,  in the last 168 hours CBC:  Recent Labs Lab 09/15/13 1856 09/16/13 0610 09/17/13 1205  WBC 2.8* 2.1* 0.6*  NEUTROABS 1.8  --   --   HGB 11.6* 9.9* 9.4*  HCT 36.1 30.6* 28.8*  MCV 90.0 90.3 89.4  PLT 41* 33* 16*   Cardiac Enzymes: No results found for this basename: CKTOTAL, CKMB, CKMBINDEX, TROPONINI,  in the last 168 hours BNP: No components found with this basename: POCBNP,  CBG: No results found for this basename: GLUCAP,  in the last 168 hours  No results found for this or any previous visit (from the past 240 hour(s)).   Scheduled Meds: . amitriptyline  100 mg Oral QHS  . benzonatate  200 mg Oral BID  . clonazePAM  1 mg Oral QID  . divalproex  500 mg Oral Daily  . donepezil  5 mg Oral QHS  . DULoxetine  60 mg Oral q morning - 10a  . feeding supplement   237 mL Oral Q24H  . fentaNYL  25 mcg Transdermal Q72H  . levothyroxine  25 mcg Oral QAC breakfast  . pantoprazole  40 mg Oral Daily  . predniSONE  7.5 mg Oral Q breakfast  . simvastatin  20 mg Oral q1800  . Tbo-Filgrastim  480 mcg Subcutaneous q1800  . tiotropium  18 mcg Inhalation Daily   Continuous Infusions: . sodium chloride 100 mL/hr at 09/18/13 0334

## 2013-09-18 NOTE — Progress Notes (Signed)
[facility] Therapy Treatment Record Phone [phone]   Radiation Therapy was administered to [place] on: 09/18/2013  1:45 PM and was treatment # 17 out of a planned course of 30 treatments.

## 2013-09-19 LAB — PREPARE PLATELET PHERESIS: Unit division: 0

## 2013-09-19 LAB — CBC
HCT: 27.3 % — ABNORMAL LOW (ref 36.0–46.0)
HEMOGLOBIN: 9.1 g/dL — AB (ref 12.0–15.0)
MCH: 29.4 pg (ref 26.0–34.0)
MCHC: 33.3 g/dL (ref 30.0–36.0)
MCV: 88.3 fL (ref 78.0–100.0)
Platelets: 27 10*3/uL — CL (ref 150–400)
RBC: 3.09 MIL/uL — AB (ref 3.87–5.11)
RDW: 13.6 % (ref 11.5–15.5)
WBC: 1 10*3/uL — CL (ref 4.0–10.5)

## 2013-09-19 MED ORDER — MAGIC MOUTHWASH W/LIDOCAINE
10.0000 mL | Freq: Three times a day (TID) | ORAL | Status: DC | PRN
  Administered 2013-09-19 – 2013-09-20 (×2): 10 mL via ORAL
  Filled 2013-09-19 (×3): qty 10

## 2013-09-19 MED ORDER — SODIUM CHLORIDE 0.9 % IV SOLN: INTRAVENOUS | Status: AC

## 2013-09-19 NOTE — Progress Notes (Signed)
Patient ID: Tonya Sullivan, female   DOB: 1955/09/25, 58 y.o.   MRN: [id number] [facility] PROGRESS NOTE  Tonya Sullivan PYP:[id number] DOB: 03-29-1955 DOA: Oct 04, 2013 PCP: Vic Blackbird, MD  Brief narrative: 58 y.o. female with a PMH of limited stage small cell lung cancer, under the care of Dr. Julien Nordmann for systemic chemotherapy and Dr. Pablo Ledger for radiation therapy. Patient was found to be hypotensive in Dr. Pablo Ledger office and so she referred her as a direct admission. The patient complained of generalized weakness, anorexia and nausea for past few days prior to this admission. She recently had diarrhea but this has now resolved. Last chemotherapy was 09/10/13 (etopside and Decadron) with Neulasta support given 09/11/13.   Assessment/Plan:    Principal Problem:  Hypotension, nausea  Likely secondary to GI losses and related dehydration versus side effects of chemotherapy. Blood pressure improving.  Nausea improving, good appetite. Stop IV fluids. Active Problems:  Drug induced (chemotherapy) pancytopenia  CBC on 09/17/2013 revealed white blood cell count 0.6, hemoglobin 9.4 and platelet count of 16. Patient was started on Neupogen 09/17/2013.  WBC count trending up, 1 on 9/19; platelet count 27 status post 1 unit platelet transfusion. Oncology called but awaiting response. Left message to [facility] nurse about pt admission  Chronic cancer related pain  Continue fentanyl patch and morphine 4 mg every 3 hours IV PRN severe pain; oxycodone PO PRN moderate pain Pt still has chest tightness with coughing. COPD (chronic obstructive pulmonary disease)  Continue Spiriva and albuterol PRN. Hypothyroidism  Continue synthroid  Dyslipidemia  Continue statin therapy  Bipolar disorder  Continue Depakote, Klonopin, Aricept, amitriptyline and Cymbalta. GERD (gastroesophageal reflux disease)  Continue PPI therapy. Small cell carcinoma of lung in a smoker  No acute cardiopulmonary  findings on CXR.  Radiation therapy daily. Unspecified protein-calorie malnutrition  Nutrition consulted. Order placed for nutritional supplements. DVT Prophylaxis  Stopped Lovenox and using SCDs bilaterally due to thrombocytopenia.   Code Status: Full.  Family Communication: plan of care discussed with the patient  Disposition Plan: Home when stable.    IV Access:   Peripheral IV  PAC Procedures and diagnostic studies:   Portable Chest 1 View 10-04-2013 Stable right lung mass. Right internal jugular vein Port-A-Cath placed with its tip at the cavoatrial junction.  Medical Consultants:   Oncology  Other Consultants:   None  Anti-Infectives:   None     Leisa Lenz, MD  [facility] Pager [phone]  If 7PM-7AM, please contact night-coverage [email or web address] Password [email or web address] 09/19/2013, 5:18 PM   LOS: 4 days    HPI/Subjective: No acute overnight events.  Objective: Filed Vitals:   09/18/13 2116 09/19/13 0527 09/19/13 1035 09/19/13 1459  BP: 115/80 121/81  102/70  Pulse: 101 98  117  Temp: 97.9 F (36.6 C) 98.4 F (36.9 C)  97.5 F (36.4 C)  TempSrc: Oral Oral  Oral  Resp: 16 18  18   Height:      Weight:  91.128 kg (200 lb 14.4 oz)    SpO2: 98% 94% 96% 94%    Intake/Output Summary (Last 24 hours) at 09/19/13 1718 Last data filed at 09/19/13 0813  Gross per 24 hour  Intake   1590 ml  Output    775 ml  Net    815 ml    Exam:   General:  Pt is alert, not in acute distress  Cardiovascular: Regular rate and rhythm, S1/S2 appreciated   Respiratory: no wheezing, no cracrakles  Abdomen: Non distended, bowel  sounds present  Extremities: Pulses DP and PT palpable bilaterally  Neuro: Grossly nonfocal  Data Reviewed: Basic Metabolic Panel:  Recent Labs Lab 09/15/13 1856 09/16/13 0610  NA 137 140  K 4.5 4.4  CL 96 103  CO2 31 30  GLUCOSE 91 91  BUN 15 16  CREATININE 0.82 0.72  CALCIUM 8.9 8.4   Liver Function Tests:  Recent Labs Lab  09/15/13 1856  AST 10  ALT 8  ALKPHOS 85  BILITOT <0.2*  PROT 6.3  ALBUMIN 3.0*   No results found for this basename: LIPASE, AMYLASE,  in the last 168 hours No results found for this basename: AMMONIA,  in the last 168 hours CBC:  Recent Labs Lab 09/15/13 1856 09/16/13 0610 09/17/13 1205 09/18/13 0830 09/19/13 0945  WBC 2.8* 2.1* 0.6* 0.8* 1.0*  NEUTROABS 1.8  --   --   --   --   HGB 11.6* 9.9* 9.4* 9.4* 9.1*  HCT 36.1 30.6* 28.8* 28.0* 27.3*  MCV 90.0 90.3 89.4 88.3 88.3  PLT 41* 33* 16* 10* 27*   Cardiac Enzymes: No results found for this basename: CKTOTAL, CKMB, CKMBINDEX, TROPONINI,  in the last 168 hours BNP: No components found with this basename: POCBNP,  CBG: No results found for this basename: GLUCAP,  in the last 168 hours  No results found for this or any previous visit (from the past 240 hour(s)).   Scheduled Meds: . amitriptyline  100 mg Oral QHS  . benzonatate  200 mg Oral BID  . clonazePAM  1 mg Oral QID  . divalproex  500 mg Oral Daily  . donepezil  5 mg Oral QHS  . DULoxetine  60 mg Oral q morning - 10a  . feeding supplement (ENSURE COMPLETE)  237 mL Oral Q24H  . fentaNYL  25 mcg Transdermal Q72H  . levothyroxine  25 mcg Oral QAC breakfast  . pantoprazole  40 mg Oral Daily  . predniSONE  7.5 mg Oral Q breakfast  . simvastatin  20 mg Oral q1800  . Tbo-Filgrastim  480 mcg Subcutaneous q1800  . tiotropium  18 mcg Inhalation Daily   Continuous Infusions: . sodium chloride 10 mL/hr at 09/19/13 0845

## 2013-09-20 DIAGNOSIS — M549 Dorsalgia, unspecified: Secondary | ICD-10-CM

## 2013-09-20 LAB — CBC WITH DIFFERENTIAL/PLATELET
BASOS ABS: 0 10*3/uL (ref 0.0–0.1)
Basophils Relative: 0 % (ref 0–1)
EOS ABS: 0.1 10*3/uL (ref 0.0–0.7)
Eosinophils Relative: 2 % (ref 0–5)
HEMATOCRIT: 27.8 % — AB (ref 36.0–46.0)
Hemoglobin: 9.3 g/dL — ABNORMAL LOW (ref 12.0–15.0)
LYMPHS ABS: 0.8 10*3/uL (ref 0.7–4.0)
Lymphocytes Relative: 24 % (ref 12–46)
MCH: 29.7 pg (ref 26.0–34.0)
MCHC: 33.5 g/dL (ref 30.0–36.0)
MCV: 88.8 fL (ref 78.0–100.0)
Monocytes Absolute: 0.5 10*3/uL (ref 0.1–1.0)
Monocytes Relative: 15 % — ABNORMAL HIGH (ref 3–12)
NEUTROS ABS: 1.8 10*3/uL (ref 1.7–7.7)
Neutrophils Relative %: 59 % (ref 43–77)
PLATELETS: 20 10*3/uL — AB (ref 150–400)
RBC: 3.13 MIL/uL — ABNORMAL LOW (ref 3.87–5.11)
RDW: 13.8 % (ref 11.5–15.5)
WBC: 3.2 10*3/uL — ABNORMAL LOW (ref 4.0–10.5)

## 2013-09-20 NOTE — Progress Notes (Signed)
Patient ID: Tonya Sullivan, female   DOB: 12/26/1955, 58 y.o.   MRN: [id number] [facility] PROGRESS NOTE  Tonya Sullivan:[id number] DOB: 01/11/1955 DOA: 09/23/13 PCP: Vic Blackbird, MD  Brief narrative: 58 y.o. female with a PMH of limited stage small cell lung cancer, under the care of Dr. Julien Nordmann for systemic chemotherapy and Dr. Pablo Ledger for radiation therapy. Patient was found to be hypotensive in Dr. Pablo Ledger office and so she referred her as a direct admission. The patient complained of generalized weakness, anorexia and nausea for past few days prior to this admission. She recently had diarrhea but this has now resolved. Last chemotherapy was 09/10/13 (etopside and Decadron) with Neulasta support given 09/11/13.   Assessment/Plan:   Principal Problem:  Drug induced (chemotherapy) pancytopenia  CBC on 09/17/2013 revealed white blood cell count 0.6, hemoglobin 9.4 and platelet count of 16. Patient was started on Neupogen 09/17/2013. WBC count better this am, 3.2. Stop Neupogen toady 09/20/2013. Platelets 20 this am, down from 27 yesterday. Pt did receive 1 unit platelets on 9/18/205.  Active Problems:  Hypotension, nausea   Likely secondary to GI losses and related dehydration versus side effects of chemotherapy. Blood pressure stable, 110/80.  Chronic cancer related pain  Continue fentanyl patch and morphine 4 mg but change frequency to every 4 hours IV PRN severe pain instead of every 3 hours; oxycodone PO every 6 hours PRN moderate pain  COPD (chronic obstructive pulmonary disease)  Continue Spiriva and albuterol PRN. Hypothyroidism  Continue synthroid  Dyslipidemia  Continue statin therapy  Bipolar disorder  Continue Depakote, Klonopin, Aricept, amitriptyline and Cymbalta. GERD (gastroesophageal reflux disease)  Continue PPI therapy. Small cell carcinoma of lung in a smoker  No acute cardiopulmonary findings on CXR.  Radiation therapy daily. So far #17/out of  planned 30 treatments. Next RT Monday 9/21. Unspecified protein-calorie malnutrition  Nutrition consulted.  Continue nutritional supplements. DVT Prophylaxis  Stopped Lovenox and using SCDs bilaterally due to thrombocytopenia.   Code Status: Full.  Family Communication: plan of care discussed with the patient  Disposition Plan: Home when stable.    IV Access:   Peripheral IV  PAC Procedures and diagnostic studies:   Portable Chest 1 View 09/23/2013 Stable right lung mass. Right internal jugular vein Port-A-Cath placed with its tip at the cavoatrial junction.  Medical Consultants:   Oncology  Other Consultants:   None  Anti-Infectives:   None   Leisa Lenz, MD  [facility] Pager [phone]  If 7PM-7AM, please contact night-coverage [email or web address] Password [facility] 09/20/2013, 12:46 PM   LOS: 5 days    HPI/Subjective: No acute overnight events.  Objective: Filed Vitals:   09/19/13 1035 09/19/13 1459 09/20/13 0546 09/20/13 1139  BP:  102/70 110/80   Pulse:  117 97   Temp:  97.5 F (36.4 C) 97.9 F (36.6 C)   TempSrc:  Oral Oral   Resp:  18 18   Height:      Weight:   90.901 kg (200 lb 6.4 oz)   SpO2: 96% 94% 96% 95%    Intake/Output Summary (Last 24 hours) at 09/20/13 1246 Last data filed at 09/20/13 0546  Gross per 24 hour  Intake    600 ml  Output    450 ml  Net    150 ml    Exam:   General:  Pt is alert, follows commands appropriately, not in acute distress  Cardiovascular: tachycardic, S1/S2, no murmurs  Respiratory: Clear to auscultation bilaterally, no wheezing  Extremities:  Pulses DP and PT palpable bilaterally  Neuro: Non focal  Data Reviewed: Basic Metabolic Panel:  Recent Labs Lab 09/15/13 1856 09/16/13 0610  NA 137 140  K 4.5 4.4  CL 96 103  CO2 31 30  GLUCOSE 91 91  BUN 15 16  CREATININE 0.82 0.72  CALCIUM 8.9 8.4   Liver Function Tests:  Recent Labs Lab 09/15/13 1856  AST 10  ALT 8  ALKPHOS 85  BILITOT <0.2*   PROT 6.3  ALBUMIN 3.0*   No results found for this basename: LIPASE, AMYLASE,  in the last 168 hours No results found for this basename: AMMONIA,  in the last 168 hours CBC:  Recent Labs Lab 09/15/13 1856 09/16/13 0610 09/17/13 1205 09/18/13 0830 09/19/13 0945 09/20/13 0758  WBC 2.8* 2.1* 0.6* 0.8* 1.0* 3.2*  NEUTROABS 1.8  --   --   --   --  1.8  HGB 11.6* 9.9* 9.4* 9.4* 9.1* 9.3*  HCT 36.1 30.6* 28.8* 28.0* 27.3* 27.8*  MCV 90.0 90.3 89.4 88.3 88.3 88.8  PLT 41* 33* 16* 10* 27* 20*   Cardiac Enzymes: No results found for this basename: CKTOTAL, CKMB, CKMBINDEX, TROPONINI,  in the last 168 hours BNP: No components found with this basename: POCBNP,  CBG: No results found for this basename: GLUCAP,  in the last 168 hours  No results found for this or any previous visit (from the past 240 hour(s)).   Scheduled Meds: . amitriptyline  100 mg Oral QHS  . benzonatate  200 mg Oral BID  . clonazePAM  1 mg Oral QID  . divalproex  500 mg Oral Daily  . donepezil  5 mg Oral QHS  . DULoxetine  60 mg Oral q morning - 10a  . feeding supplement (ENSURE COMPLETE)  237 mL Oral Q24H  . fentaNYL  25 mcg Transdermal Q72H  . levothyroxine  25 mcg Oral QAC breakfast  . pantoprazole  40 mg Oral Daily  . predniSONE  7.5 mg Oral Q breakfast  . simvastatin  20 mg Oral q1800  . tiotropium  18 mcg Inhalation Daily   Continuous Infusions:

## 2013-09-21 ENCOUNTER — Ambulatory Visit
Admission: RE | Admit: 2013-09-21 | Discharge: 2013-09-21 | Disposition: A | Discharge status: Home / Self Care | Payer: Medicare Other | Source: Ambulatory Visit | Attending: Radiation Oncology | Admitting: Radiation Oncology

## 2013-09-21 ENCOUNTER — Other Ambulatory Visit: Payer: Self-pay | Admitting: Physician Assistant

## 2013-09-21 DIAGNOSIS — T451X5A Adverse effect of antineoplastic and immunosuppressive drugs, initial encounter: Secondary | ICD-10-CM

## 2013-09-21 DIAGNOSIS — F3161 Bipolar disorder, current episode mixed, mild: Secondary | ICD-10-CM

## 2013-09-21 LAB — CBC
HCT: 29.3 % — ABNORMAL LOW (ref 36.0–46.0)
Hemoglobin: 9.9 g/dL — ABNORMAL LOW (ref 12.0–15.0)
MCH: 29.4 pg (ref 26.0–34.0)
MCHC: 33.8 g/dL (ref 30.0–36.0)
MCV: 86.9 fL (ref 78.0–100.0)
PLATELETS: 26 10*3/uL — AB (ref 150–400)
RBC: 3.37 MIL/uL — ABNORMAL LOW (ref 3.87–5.11)
RDW: 13.9 % (ref 11.5–15.5)
WBC: 11.6 10*3/uL — ABNORMAL HIGH (ref 4.0–10.5)

## 2013-09-21 MED ORDER — PROCHLORPERAZINE MALEATE 10 MG PO TABS: 10.0000 mg | ORAL_TABLET | Freq: Four times a day (QID) | ORAL | Status: DC | PRN

## 2013-09-21 MED ORDER — HEPARIN SOD (PORK) LOCK FLUSH 100 UNIT/ML IV SOLN
500.0000 [IU] | Freq: Once | INTRAVENOUS | Status: AC
  Administered 2013-09-21: 500 [IU] via INTRAVENOUS
  Filled 2013-09-21: qty 5

## 2013-09-21 MED ORDER — ONDANSETRON HCL 4 MG PO TABS: 4.0000 mg | ORAL_TABLET | Freq: Four times a day (QID) | ORAL | Status: DC | PRN

## 2013-09-21 MED ORDER — ENSURE COMPLETE PO LIQD: 237.0000 mL | ORAL | Status: AC

## 2013-09-21 MED ORDER — BENZONATATE 200 MG PO CAPS: 200.0000 mg | ORAL_CAPSULE | Freq: Two times a day (BID) | ORAL | Status: DC | PRN

## 2013-09-21 MED ORDER — GUAIFENESIN-DM 100-10 MG/5ML PO SYRP: 5.0000 mL | ORAL_SOLUTION | ORAL | Status: DC | PRN

## 2013-09-21 MED ORDER — MAGIC MOUTHWASH W/LIDOCAINE: 10.0000 mL | Freq: Three times a day (TID) | ORAL | Status: DC | PRN

## 2013-09-21 NOTE — Discharge Instructions (Signed)
Thrombocytopenia Thrombocytopenia is a condition in which there is an abnormally small number of platelets in your blood. Platelets are also called thrombocytes. Platelets are needed for blood clotting. CAUSES Thrombocytopenia is caused by:   Decreased production of platelets. This can be caused by:  Aplastic anemia in which your bone marrow quits making blood cells.  Cancer in the bone marrow.  Use of certain medicines, including chemotherapy.  Infection in the bone marrow.  Heavy alcohol consumption.  Increased destruction of platelets. This can be caused by:  Certain immune diseases.  Use of certain drugs.  Certain blood clotting disorders.  Certain inherited disorders.  Certain bleeding disorders.  Pregnancy.  Having an enlarged spleen (hypersplenism). In hypersplenism, the spleen gathers up platelets from circulation. This means the platelets are not available to help with blood clotting. The spleen can enlarge due to cirrhosis or other conditions. SYMPTOMS  The symptoms of thrombocytopenia are side effects of poor blood clotting. Some of these are:  Abnormal bleeding.  Nosebleeds.  Heavy menstrual periods.  Blood in the urine or stools.  Purpura. This is a purplish discoloration in the skin produced by small bleeding vessels near the surface of the skin.  Bruising.  A rash that may be petechial. This looks like pinpoint, purplish-red spots on the skin and mucous membranes. It is caused by bleeding from small blood vessels (capillaries). DIAGNOSIS  Your caregiver will make this diagnosis based on your exam and blood tests. Sometimes, a bone marrow study is done to look for the original cells (megakaryocytes) that make platelets. TREATMENT  Treatment depends on the cause of the condition.  Medicines may be given to help protect your platelets from being destroyed.  In some cases, a replacement (transfusion) of platelets may be required to stop or prevent  bleeding.  Sometimes, the spleen must be surgically removed. HOME CARE INSTRUCTIONS   Check the skin and linings inside your mouth for bruising or bleeding as directed by your caregiver.  Check your sputum, urine, and stool for blood as directed by your caregiver.  Do not return to any activities that could cause bumps or bruises until your caregiver says it is okay.  Take extra care not to cut yourself when shaving or when using scissors, needles, knives, and other tools.  Take extra care not to burn yourself when ironing or cooking.  Ask your caregiver if it is okay for you to drink alcohol.  Only take over-the-counter or prescription medicines as directed by your caregiver.  Notify all your caregivers, including dentists and eye doctors, about your condition. SEEK IMMEDIATE MEDICAL CARE IF:   You develop active bleeding from anywhere in your body.  You develop unexplained bruising or bleeding.  You have blood in your sputum, urine, or stool. MAKE SURE YOU:  Understand these instructions.  Will watch your condition.  Will get help right away if you are not doing well or get worse. Document Released: 12/18/2004 Document Revised: 03/12/2011 Document Reviewed: 10/20/2010 [facility] Patient Information 2015 [place], [place]. This information is not intended to replace advice given to you by your health care provider. Make sure you discuss any questions you have with your health care provider.

## 2013-09-21 NOTE — Care Management Note (Signed)
CARE MANAGEMENT NOTE 09/21/2013  Patient:  Tonya Sullivan, Tonya Sullivan   Account Number:  [id number]  Date Initiated:  09/16/2013  Documentation initiated by:  Leafy Kindle  Subjective/Objective Assessment:   58 yo admitted with hypotension.  Hx of small cell lung CA.     Action/Plan:   From home alone.   Anticipated DC Date:  09/21/2013   Anticipated DC Plan:  [place]  CM consult      Choice offered to / List presented to:  C-1 Patient        [place]  [facility]      [place].   Status of service:  In process, will continue to follow Medicare Important Message given?  YES (If response is "NO", the following Medicare IM given date fields will be blank) Date Medicare IM given:  09/18/2013 Medicare IM given by:  Leafy Kindle Date Additional Medicare IM given:  09/21/2013 Additional Medicare IM given by:  [facility]  Discharge Disposition:    Per UR Regulation:  Reviewed for med. necessity/level of care/duration of stay  If discussed at [place] of Stay Meetings, dates discussed:    Comments:  09/21/13 Marney Doctor RN,BSN,NCM Pt is active with [facility].  PT recommending HHPT.  Erasmo Downer from [facility] called to inform of need for resumption of services.  09/16/13 Marney Doctor RN,BSN,NCM Pt from home alone.  Pt states that she has an aide from [employer] 2 hours a day and has a lot of family and friend support.  CM will continue to monitor for DC needs.

## 2013-09-21 NOTE — Discharge Summary (Signed)
Physician Discharge Summary  Tonya Sullivan:[id number] DOB: [birth year] DOA: 10-09-13  PCP: Vic Blackbird, MD  Admit date: 2013-10-09 Discharge date: 09/21/2013  Recommendations for Outpatient Follow-up:  1. Patient has scheduled appointment in [place] 09/22/2013 and 9:45 AM, blood work will be done at that time as well.  Discharge Diagnoses:  Principal Problem:   Hypotension Active Problems:   Chronic pain   COPD (chronic obstructive pulmonary disease)   Tobacco use   Bipolar disorder   GERD (gastroesophageal reflux disease)   Small cell carcinoma of lung   Unspecified protein-calorie malnutrition   Nausea alone   Low BP    Discharge Condition: stable   Diet recommendation: as tolerated   History of present illness:  58 y.o. female with a PMH of limited stage small cell lung cancer, under the care of Dr. Julien Nordmann for systemic chemotherapy and Dr. Pablo Ledger for radiation therapy. Patient was found to be hypotensive in Dr. Pablo Ledger office and so she referred her as a direct admission. The patient complained of generalized weakness, anorexia and nausea for past few days prior to this admission. She recently had diarrhea but this has now resolved. Last chemotherapy was 09/10/13 (Etopside and Decadron) with Neulasta support given 09/11/13. Hospital course was complicated with pancytopenia. Patient has received Neupogen for 4 doses. Her white blood cell count is now 11.6. Patient has received one unit of platelets on 09/18/2013.  Assessment/Plan:   Principal Problem:  Drug induced (chemotherapy) pancytopenia  CBC on 09/17/2013 revealed white blood cell count 0.6, hemoglobin 9.4 and platelet count of 16. Patient was started on Neupogen 09/17/2013. WBC count is 11.6 this morning. Stopped Neupogen 09/20/2013.  Platelets 26 this am which is up from yesterday platelet count of 20. Pt did receive 1 unit platelets on 09/18/2013. Active Problems:  Hypotension, nausea  Likely  secondary to GI losses and related dehydration versus side effects of chemotherapy. Blood pressure stable, 102/70. Chronic cancer related pain  Continue pain medications per previous home regimen. Prescriptions not provided at the time of discharge because patient has appointment in [place] tomorrow morning. COPD (chronic obstructive pulmonary disease)  Continue Spiriva and albuterol PRN. Hypothyroidism  Continue synthroid  Dyslipidemia  Continue statin therapy  Bipolar disorder  Continue Depakote, Klonopin, Aricept, amitriptyline and Cymbalta. GERD (gastroesophageal reflux disease)  Continue PPI therapy. Small cell carcinoma of lung in a smoker  No acute cardiopulmonary findings on CXR.  Radiation therapy daily. So far #17/out of planned 30 treatments. Next RT Monday today 09/21/2013 patient will go home after radiation therapy. Today it will be 18 out of plan and 30 radiation treatments Unspecified protein-calorie malnutrition  Nutrition consulted.  Continue nutritional supplements. DVT Prophylaxis  Stopped Lovenox and used SCDs bilaterally due to thrombocytopenia.   Code Status: Full.  Family Communication: plan of care discussed with the patient    IV Access:   Peripheral IV  PAC Procedures and diagnostic studies:   Portable Chest 1 View 10/09/2013 Stable right lung mass. Right internal jugular vein Port-A-Cath placed with its tip at the cavoatrial junction.  Medical Consultants:   Oncology - phone call only Other Consultants:   Physical therapy Anti-Infectives:   None    Signed:  Leisa Lenz, MD  [facility] 09/21/2013, 11:16 AM  Pager #: [phone]   Discharge Exam: Filed Vitals:   09/21/13 0556  BP: 102/70  Pulse: 109  Temp: 98.1 F (36.7 C)  Resp: 16   Filed Vitals:   09/20/13 1139 09/20/13 1353 09/20/13  2041 09/21/13 0556  BP:  104/63 106/73 102/70  Pulse:  117 110 109  Temp:  97.6 F (36.4 C) 98.5 F (36.9 C) 98.1 F (36.7 C)   TempSrc:  Oral Oral Oral  Resp:  16 16 16   Height:      Weight:      SpO2: 95% 94% 93% 93%    General: Pt is alert, follows commands appropriately, not in acute distress Cardiovascular: Regular rate and rhythm, S1/S2 +, no murmurs Respiratory: Clear to auscultation bilaterally, no wheezing, no crackles, no rhonchi Abdominal: Soft, non tender, non distended, bowel sounds +, no guarding Extremities: no edema, no cyanosis, pulses palpable bilaterally DP and PT Neuro: Grossly nonfocal  Discharge Instructions  Discharge Instructions   Call MD for:  difficulty breathing, headache or visual disturbances    Complete by:  As directed      Call MD for:  persistant dizziness or light-headedness    Complete by:  As directed      Call MD for:  persistant nausea and vomiting    Complete by:  As directed      Call MD for:  severe uncontrolled pain    Complete by:  As directed      Diet - low sodium heart healthy    Complete by:  As directed      Increase activity slowly    Complete by:  As directed             Medication List         albuterol 108 (90 BASE) MCG/ACT inhaler  Commonly known as:  PROVENTIL HFA;VENTOLIN HFA  Inhale 2 puffs into the lungs 3 (three) times daily as needed for wheezing or shortness of breath.     amitriptyline 100 MG tablet  Commonly known as:  ELAVIL  Take 1 tablet (100 mg total) by mouth at bedtime.     benzonatate 200 MG capsule  Commonly known as:  TESSALON  Take 1 capsule (200 mg total) by mouth 2 (two) times daily as needed for cough.     clonazePAM 1 MG tablet  Commonly known as:  KLONOPIN  Take 1 tablet (1 mg total) by mouth 4 (four) times daily.     dexlansoprazole 60 MG capsule  Commonly known as:  DEXILANT  Take 1 capsule (60 mg total) by mouth daily.     divalproex 500 MG 24 hr tablet  Commonly known as:  DEPAKOTE ER  Take 1 tablet (500 mg total) by mouth daily.     donepezil 5 MG tablet  Commonly known as:  ARICEPT  Take 1 tablet  (5 mg total) by mouth at bedtime.     DULoxetine 60 MG capsule  Commonly known as:  CYMBALTA  Take 1 capsule (60 mg total) by mouth every morning.     emollient cream  Commonly known as:  BIAFINE  Apply 1 application topically 2 (two) times daily. Apply to affected chest area after rad tx and bedtime, just not 4 hours prior to having rad txs, also use on the weekends     EPINEPHrine 0.3 mg/0.3 mL Soaj injection  Commonly known as:  EPIPEN  Inject 0.3 mLs (0.3 mg total) into the muscle once. PRN FOR SEVERE ALLERGIC REACTION     feeding supplement (ENSURE COMPLETE) Liqd  Take 237 mLs by mouth daily.     fentaNYL 25 MCG/HR patch  Commonly known as:  DURAGESIC  Place 1 patch (25 mcg total) onto the skin  every 3 (three) days.     guaiFENesin-dextromethorphan 100-10 MG/5ML syrup  Commonly known as:  ROBITUSSIN DM  Take 5 mLs by mouth every 4 (four) hours as needed for cough.     levothyroxine 25 MCG tablet  Commonly known as:  SYNTHROID, LEVOTHROID  Take 25 mcg by mouth daily before breakfast.     lidocaine 5 %  Commonly known as:  LIDODERM  Place 1 patch onto the skin daily. Remove & Discard patch within 12 hours or as directed by MD     lidocaine-prilocaine cream  Commonly known as:  EMLA  Apply 1 application topically as needed. Apply to port 1 hr before chemo     magic mouthwash w/lidocaine Soln  Take 10 mLs by mouth 3 (three) times daily as needed for mouth pain.     metoprolol tartrate 25 MG tablet  Commonly known as:  LOPRESSOR  Take 12.5 mg by mouth 2 (two) times daily with a meal.     ondansetron 4 MG tablet  Commonly known as:  ZOFRAN  Take 1 tablet (4 mg total) by mouth every 6 (six) hours as needed for refractory nausea / vomiting.     oxyCODONE-acetaminophen 5-325 MG per tablet  Commonly known as:  PERCOCET/ROXICET  Take 1 tablet by mouth every 6 (six) hours as needed for severe pain.     pantoprazole 40 MG tablet  Commonly known as:  PROTONIX  Take 1  tablet (40 mg total) by mouth daily.     pravastatin 40 MG tablet  Commonly known as:  PRAVACHOL  Take 1 tablet (40 mg total) by mouth every evening.     predniSONE 2.5 MG tablet  Commonly known as:  DELTASONE  Take 3 tablets (7.5 mg total) by mouth daily with breakfast.     PRESCRIPTION MEDICATION  etoposide (VEPESID) 250 mg in sodium chloride 0.9 % 650 mL chemo infusion 120 mg/m2  2.05 m2 (Treatment Plan Actual)  Once     PRESCRIPTION MEDICATION  CISplatin (PLATINOL) 123 mg in sodium chloride 0.9 % 500 mL chemo infusion 60 mg/m2  2.05 m2 (Treatment Plan Actual)  Once     prochlorperazine 10 MG tablet  Commonly known as:  COMPAZINE  Take 1 tablet (10 mg total) by mouth every 6 (six) hours as needed for nausea or vomiting.     SPIRIVA RESPIMAT 2.5 MCG/ACT Aers  Generic drug:  Tiotropium Bromide Monohydrate  Inhale 2 puffs into the lungs daily.           Follow-up Information   Follow up with Carlton Adam, PA-C On 09/22/2013. (Follow up appt after recent hospitalization at 9:45 am)    Specialty:  Physician Assistant   Contact information:   [address] [place] [id number] [phone]        The results of significant diagnostics from this hospitalization (including imaging, microbiology, ancillary and laboratory) are listed below for reference.    Significant Diagnostic Studies: Ir Fluoro Guide Cv Line Right  08/27/2013   CLINICAL DATA:  Lung carcinoma, needs venous access for chemotherapy.  EXAM: TUNNELED PORT CATHETER PLACEMENT WITH ULTRASOUND AND FLUOROSCOPIC GUIDANCE  FLUOROSCOPY TIME:  48 seconds  ANESTHESIA/SEDATION: Intravenous Fentanyl and Versed were administered as conscious sedation during continuous cardiorespiratory monitoring by the radiology RN, with a total moderate sedation time of 15 minutes.  TECHNIQUE: The procedure, risks, benefits, and alternatives were explained to the patient. Questions regarding the procedure  were encouraged and answered.  The patient understands and consents to the procedure. As antibiotic prophylaxis, cefazolin 2 g was ordered pre-procedure and administered intravenously within one hour of incision. Patency of the right IJ vein was confirmed with ultrasound with image documentation. An appropriate skin site was determined. Skin site was marked. Region was prepped using maximum barrier technique including cap and mask, sterile gown, sterile gloves, large sterile sheet, and Chlorhexidine as cutaneous antisepsis. The region was infiltrated locally with 1% lidocaine. Under real-time ultrasound guidance, the right IJ vein was accessed with a 21 gauge micropuncture needle; the needle tip within the vein was confirmed with ultrasound image documentation. Needle was exchanged over a 018 guidewire for transitional dilator which allowed passage of the [facility] wire into the IVC. Over this, the transitional dilator was exchanged for a 5 [place] MPA catheter. A small incision was made on the right anterior chest wall and a subcutaneous pocket fashioned. The power-injectable port was positioned and its catheter tunneled to the right IJ dermatotomy site. The MPA catheter was exchanged over an Amplatz wire for a peel-away sheath, through which the port catheter, which had been trimmed to the appropriate length, was advanced and positioned under fluoroscopy with its tip at the cavoatrial junction. Spot chest radiograph confirms good catheter position and no pneumothorax. The pocket was closed with deep interrupted and subcuticular continuous 3-0 Monocryl sutures. The port was flushed per protocol. The incisions were covered with Dermabond then covered with a sterile dressing. No immediate complication.  IMPRESSION: Technically successful right IJ power-injectable port catheter placement. Ready for routine use.   Electronically Signed   By: Arne Cleveland M.D.   On: 08/27/2013 12:37   Ir US Guide Vasc Access  Right  08/27/2013   CLINICAL DATA:  Lung carcinoma, needs venous access for chemotherapy.  EXAM: TUNNELED PORT CATHETER PLACEMENT WITH ULTRASOUND AND FLUOROSCOPIC GUIDANCE  FLUOROSCOPY TIME:  48 seconds  ANESTHESIA/SEDATION: Intravenous Fentanyl and Versed were administered as conscious sedation during continuous cardiorespiratory monitoring by the radiology RN, with a total moderate sedation time of 15 minutes.  TECHNIQUE: The procedure, risks, benefits, and alternatives were explained to the patient. Questions regarding the procedure were encouraged and answered. The patient understands and consents to the procedure. As antibiotic prophylaxis, cefazolin 2 g was ordered pre-procedure and administered intravenously within one hour of incision. Patency of the right IJ vein was confirmed with ultrasound with image documentation. An appropriate skin site was determined. Skin site was marked. Region was prepped using maximum barrier technique including cap and mask, sterile gown, sterile gloves, large sterile sheet, and Chlorhexidine as cutaneous antisepsis. The region was infiltrated locally with 1% lidocaine. Under real-time ultrasound guidance, the right IJ vein was accessed with a 21 gauge micropuncture needle; the needle tip within the vein was confirmed with ultrasound image documentation. Needle was exchanged over a 018 guidewire for transitional dilator which allowed passage of the [facility] wire into the IVC. Over this, the transitional dilator was exchanged for a 5 [place] MPA catheter. A small incision was made on the right anterior chest wall and a subcutaneous pocket fashioned. The power-injectable port was positioned and its catheter tunneled to the right IJ dermatotomy site. The MPA catheter was exchanged over an Amplatz wire for a peel-away sheath, through which the port catheter, which had been trimmed to the appropriate length, was advanced and positioned under fluoroscopy with its tip at the cavoatrial  junction. Spot chest radiograph confirms good catheter position and no pneumothorax. The pocket was closed with deep  interrupted and subcuticular continuous 3-0 Monocryl sutures. The port was flushed per protocol. The incisions were covered with Dermabond then covered with a sterile dressing. No immediate complication.  IMPRESSION: Technically successful right IJ power-injectable port catheter placement. Ready for routine use.   Electronically Signed   By: Arne Cleveland M.D.   On: 08/27/2013 12:37   Portable Chest 1 View  09/15/2013   CLINICAL DATA:  Cough and weakness  EXAM: PORTABLE CHEST - 1 VIEW  COMPARISON:  08/20/2013  FINDINGS: Right IJ vein Port-A-Cath has been placed with its tip at the cavoatrial junction. Normal heart size. Left basilar subsegmental atelectasis. Central right lung mass. No pneumothorax.  IMPRESSION: Stable right lung mass.  Right internal jugular vein Port-A-Cath placed with its tip at the cavoatrial junction.   Electronically Signed   By: Maryclare Bean M.D.   On: 09/15/2013 21:37    Microbiology: No results found for this or any previous visit (from the past 240 hour(s)).   Labs: Basic Metabolic Panel:  Recent Labs Lab 09/15/13 1856 09/16/13 0610  NA 137 140  K 4.5 4.4  CL 96 103  CO2 31 30  GLUCOSE 91 91  BUN 15 16  CREATININE 0.82 0.72  CALCIUM 8.9 8.4   Liver Function Tests:  Recent Labs Lab 09/15/13 1856  AST 10  ALT 8  ALKPHOS 85  BILITOT <0.2*  PROT 6.3  ALBUMIN 3.0*   No results found for this basename: LIPASE, AMYLASE,  in the last 168 hours No results found for this basename: AMMONIA,  in the last 168 hours CBC:  Recent Labs Lab 09/15/13 1856  09/17/13 1205 09/18/13 0830 09/19/13 0945 09/20/13 0758 09/21/13 0900  WBC 2.8*  < > 0.6* 0.8* 1.0* 3.2* 11.6*  NEUTROABS 1.8  --   --   --   --  1.8  --   HGB 11.6*  < > 9.4* 9.4* 9.1* 9.3* 9.9*  HCT 36.1  < > 28.8* 28.0* 27.3* 27.8* 29.3*  MCV 90.0  < > 89.4 88.3 88.3 88.8 86.9  PLT 41*   < > 16* 10* 27* 20* 26*  < > = values in this interval not displayed. Cardiac Enzymes: No results found for this basename: CKTOTAL, CKMB, CKMBINDEX, TROPONINI,  in the last 168 hours BNP: BNP (last 3 results)  Recent Labs  10/25/12 0930 08/20/13 0613  PROBNP 209.0* 1866.0*   CBG: No results found for this basename: GLUCAP,  in the last 168 hours  Time coordinating discharge: Over 30 minutes

## 2013-09-21 NOTE — Progress Notes (Signed)
[facility] [place] [facility] Therapy Treatment Record Phone [phone]   Radiation Therapy was administered to Tonya Sullivan on: 09/21/2013  12:39 PM and was treatment # 18 out of a planned course of 30 treatments.

## 2013-09-21 NOTE — Evaluation (Signed)
Physical Therapy One Time Evaluation Patient Details Name: Tonya Sullivan MRN: [id number] DOB: May 14, 1955 Today's Date: 09/21/2013   History of Present Illness  58 y.o. female with a PMH of  small cell lung cancer, currently receiving systemic chemotherapy and radiation therapy. Patient direct admission from Dr. Pablo Ledger office  for hypotension and admitted with drug induced pancytopenia.  Clinical Impression  Patient evaluated by Physical Therapy with no further acute PT needs identified. All education has been completed and the patient has no further questions. Pt ambulated in hallway using hand rail occasionally.  Pt agreeable to use assistive device at home for safety if not feeling well/weak/dizzy.  (Pt also seen ambulating modified independent with hand rail later this morning after eval but prior to writing this note.) Pt reports just starting HHPT prior to admission so recommend continue with HHPT for f/u. PT is signing off. Thank you for this referral.      Follow Up Recommendations Home health PT    Equipment Recommendations  None recommended by PT    Recommendations for Other Services       Precautions / Restrictions        Mobility  Bed Mobility Overal bed mobility: Modified Independent                Transfers Overall transfer level: Needs assistance   Transfers: Sit to/from Stand Sit to Stand: Supervision            Ambulation/Gait Ambulation/Gait assistance: Min guard;Supervision Ambulation Distance (Feet): 180 Feet Assistive device: None (hand rail) Gait Pattern/deviations: Step-through pattern Gait velocity: decr   General Gait Details: slow but steady gait speed, pt would use hand rail for more support, encouraged to use SPC or RW at home if feeling weak or dizzy (as pt to continue with treatments)  Stairs            Wheelchair Mobility    Modified Rankin (Stroke Patients Only)       Balance                                              Pertinent Vitals/Pain Pain Assessment: No/denies pain    Home Living Family/patient expects to be discharged to:: Private residence Living Arrangements: Alone Available Help at Discharge: Family;Neighbor;Available PRN/intermittently;Personal care attendant Type of Home: Apartment Home Access: Stairs to enter     Home Layout: One level Home Equipment: Grab bars - tub/shower;Shower seat;Toilet riser;Cane - single point;Walker - 4 wheels      Prior Function Level of Independence: Independent with assistive device(s)         Comments: reports occasional use of assistive device, last fall over 6 months ago per pt     Hand Dominance        Extremity/Trunk Assessment               Lower Extremity Assessment: Generalized weakness         Communication   Communication: No difficulties  Cognition Arousal/Alertness: Awake/alert Behavior During Therapy: WFL for tasks assessed/performed Overall Cognitive Status: Within Functional Limits for tasks assessed                      General Comments      Exercises        Assessment/Plan    PT Assessment All further PT needs can be met  in the next venue of care  PT Diagnosis Generalized weakness   PT Problem List Decreased strength;Decreased activity tolerance  PT Treatment Interventions     PT Goals (Current goals can be found in the Care Plan section) Acute Rehab PT Goals PT Goal Formulation: No goals set, d/c therapy    Frequency     Barriers to discharge        Co-evaluation               End of Session   Activity Tolerance: Patient tolerated treatment well Patient left: with call bell/phone within reach;in chair (with MD)           Time: 0016-4290 PT Time Calculation (min): 10 min   Charges:   PT Evaluation $Initial PT Evaluation Tier I: 1 Procedure PT Treatments $Gait Training: 8-22 mins   PT G Codes:          Alexzandrea Normington,KATHrine E 09/21/2013,  1:15 PM Carmelia Bake, PT, DPT 09/21/2013 Pager: [phone]

## 2013-09-22 ENCOUNTER — Encounter: Payer: Self-pay | Admitting: Physician Assistant

## 2013-09-22 ENCOUNTER — Encounter: Payer: Self-pay | Admitting: Radiation Oncology

## 2013-09-22 ENCOUNTER — Ambulatory Visit: Payer: Medicare Other

## 2013-09-22 ENCOUNTER — Telehealth: Payer: Self-pay | Admitting: *Deleted

## 2013-09-22 ENCOUNTER — Other Ambulatory Visit (HOSPITAL_BASED_OUTPATIENT_CLINIC_OR_DEPARTMENT_OTHER): Payer: Medicare Other

## 2013-09-22 ENCOUNTER — Telehealth: Payer: Self-pay | Admitting: Physician Assistant

## 2013-09-22 ENCOUNTER — Ambulatory Visit (HOSPITAL_BASED_OUTPATIENT_CLINIC_OR_DEPARTMENT_OTHER): Payer: Medicare Other | Admitting: Physician Assistant

## 2013-09-22 VITALS — BP 107/93 | HR 109 | Temp 98.9°F | Resp 18 | Ht 69.0 in | Wt 200.0 lb

## 2013-09-22 DIAGNOSIS — C3490 Malignant neoplasm of unspecified part of unspecified bronchus or lung: Secondary | ICD-10-CM

## 2013-09-22 DIAGNOSIS — C3491 Malignant neoplasm of unspecified part of right bronchus or lung: Secondary | ICD-10-CM

## 2013-09-22 DIAGNOSIS — C3410 Malignant neoplasm of upper lobe, unspecified bronchus or lung: Secondary | ICD-10-CM

## 2013-09-22 DIAGNOSIS — I6529 Occlusion and stenosis of unspecified carotid artery: Secondary | ICD-10-CM

## 2013-09-22 LAB — CBC WITH DIFFERENTIAL/PLATELET
BASO%: 0.4 % (ref 0.0–2.0)
BASOS ABS: 0.1 10*3/uL (ref 0.0–0.1)
EOS ABS: 0.1 10*3/uL (ref 0.0–0.5)
EOS%: 0.6 % (ref 0.0–7.0)
HEMATOCRIT: 28.6 % — AB (ref 34.8–46.6)
HEMOGLOBIN: 9.5 g/dL — AB (ref 11.6–15.9)
LYMPH%: 5.9 % — ABNORMAL LOW (ref 14.0–49.7)
MCH: 29.3 pg (ref 25.1–34.0)
MCHC: 33.2 g/dL (ref 31.5–36.0)
MCV: 88.3 fL (ref 79.5–101.0)
MONO#: 1.8 10*3/uL — ABNORMAL HIGH (ref 0.1–0.9)
MONO%: 13.2 % (ref 0.0–14.0)
NEUT%: 79.9 % — AB (ref 38.4–76.8)
NEUTROS ABS: 11 10*3/uL — AB (ref 1.5–6.5)
Platelets: 46 10*3/uL — ABNORMAL LOW (ref 145–400)
RBC: 3.24 10*6/uL — ABNORMAL LOW (ref 3.70–5.45)
RDW: 14.2 % (ref 11.2–14.5)
WBC: 13.7 10*3/uL — ABNORMAL HIGH (ref 3.9–10.3)
lymph#: 0.8 10*3/uL — ABNORMAL LOW (ref 0.9–3.3)

## 2013-09-22 LAB — COMPREHENSIVE METABOLIC PANEL (CC13)
ALBUMIN: 3 g/dL — AB (ref 3.5–5.0)
ALT: <6 U/L (ref 0–55)
AST: 10 U/L (ref 5–34)
Alkaline Phosphatase: 90 U/L (ref 40–150)
Anion Gap: 10 mEq/L (ref 3–11)
BUN: 6.7 mg/dL — AB (ref 7.0–26.0)
CALCIUM: 8.7 mg/dL (ref 8.4–10.4)
CO2: 28 mEq/L (ref 22–29)
CREATININE: 0.8 mg/dL (ref 0.6–1.1)
Chloride: 98 mEq/L (ref 98–109)
GLUCOSE: 117 mg/dL (ref 70–140)
POTASSIUM: 4 meq/L (ref 3.5–5.1)
Sodium: 136 mEq/L (ref 136–145)
Total Bilirubin: <0.2 mg/dL (ref 0.20–1.20)
Total Protein: 6.3 g/dL — ABNORMAL LOW (ref 6.4–8.3)

## 2013-09-22 LAB — MAGNESIUM (CC13): MAGNESIUM: 2 mg/dL (ref 1.5–2.5)

## 2013-09-22 NOTE — Telephone Encounter (Signed)
Pt confirmed labs/ov per 09/22 POF, spk w/Michelle added chemo, gave pt AVS....KJ

## 2013-09-22 NOTE — Telephone Encounter (Signed)
Per staff phone call and POF I have schedueld appts. Scheduler advised of appts.  JMW  

## 2013-09-22 NOTE — Progress Notes (Addendum)
No images are attached to the encounter. No scans are attached to the encounter. No scans are attached to the encounter. [facility] VISIT PROGRESS NOTE  Vic Blackbird, MD [address] [place] [address]  DIAGNOSIS: Small cell carcinoma of lung   Primary site: Lung (Right)   Staging method: AJCC 7th Edition   Clinical: Stage IIA (T1a, N1, M0) signed by Curt Bears, MD on 08/06/2013  2:02 PM   Summary: Stage IIA (T1a, N1, M0)  PRIOR THERAPY: none  CURRENT THERAPY:  Systemic chemotherapy with cisplatin 60 mg/m2 on day1, etoposide 120 mg/m2 on days 1, 2, and 3 with neulasta support on day 4. Status post 2 cycles.  INTERVAL HISTORY: Tonya Sullivan 58 y.o. female returns for a hospital follow up visit. folloscheduled regular symptom visit for followup of recently diagnosed limited stage small cell lung cancer. She status post 2 cycles of systemic chemotherapy with cisplatin and etoposide with Neulasta support. She was discharged yesterday from the hospital. She was admitted with hypotension and pancytopenia. She did not develop fever. She was given neupogen while in the hospital as well as one unit of platelets. She denied any active bleeding issues. She is very worn out and would like to postpone chemotherapy if possible.  She denied fever, chills, cough or hemoptysis. She has some shortness breath with exertion. She denied any current nausea or vomiting, diarrhea or constipation. She denied any significant weight loss or night sweats. She reports continue pain in the mid esophagus area. Per her report, she is status post esophageal surgery by [facility] on 03/26/2013. She continues to have nausea not well controlled with her current anti-emetics.  MEDICAL HISTORY: Past Medical History  Diagnosis Date  . COPD (chronic obstructive pulmonary disease)   . Depression   . Chronic back pain     DDD, disc bulge, radiculopathy, spinal stenosis  .  Hyperlipidemia   . Anxiety   . Bipolar disorder, unspecified   . Carotid artery calcification   . DDD (degenerative disc disease), lumbosacral   . Spinal stenosis, lumbar   . Autoimmune thyroiditis   . Essential hypertension, benign   . TIA (transient ischemic attack)     August 2014  . GERD (gastroesophageal reflux disease)   . Neuropathic pain   . Addison's disease     On Prednisone  . History of pneumonia   . Achalasia   . Melanoma   . Frequent falls   . DM (diabetes mellitus) with complications   . 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 Hypothyroidism     ALLERGIES:  is allergic to codeine; cyclobenzaprine; darvocet; abilify; ace inhibitors; adhesive; gabapentin; iodine; levofloxacin; prednisone; sulfa antibiotics; and robaxin.  MEDICATIONS:  Current Outpatient Prescriptions  Medication Sig Dispense Refill  . albuterol (PROVENTIL HFA;VENTOLIN HFA) 108 (90 BASE) MCG/ACT inhaler Inhale 2 puffs into the lungs 3 (three) times daily as needed for wheezing or shortness of breath.      . Alum & Mag Hydroxide-Simeth (MAGIC MOUTHWASH W/LIDOCAINE) SOLN Take 10 mLs by mouth 3 (three) times daily as needed for mouth pain.  50 mL  0  . amitriptyline (ELAVIL) 100 MG tablet Take 1 tablet (100 mg total) by mouth at bedtime.  30 tablet  2  . benzonatate (TESSALON) 200 MG capsule Take 1 capsule (200 mg total) by mouth 2 (two) times daily as needed for cough.  20 capsule  0  . clonazePAM (KLONOPIN) 1 MG tablet  Take 1 tablet (1 mg total) by mouth 4 (four) times daily.  120 tablet  2  . dexlansoprazole (DEXILANT) 60 MG capsule Take 1 capsule (60 mg total) by mouth daily.  30 capsule  3  . divalproex (DEPAKOTE ER) 500 MG 24 hr tablet Take 1 tablet (500 mg total) by mouth daily.  30 tablet  2  . donepezil (ARICEPT) 5 MG tablet Take 1 tablet (5 mg total) by mouth at bedtime.  30 tablet  3  . DULoxetine (CYMBALTA) 60 MG capsule Take 1 capsule (60 mg total) by mouth every morning.  30 capsule  2  .  emollient (BIAFINE) cream Apply 1 application topically 2 (two) times daily. Apply to affected chest area after rad tx and bedtime, just not 4 hours prior to having rad txs, also use on the weekends      . EPINEPHrine (EPIPEN) 0.3 mg/0.3 mL IJ SOAJ injection Inject 0.3 mLs (0.3 mg total) into the muscle once. PRN FOR SEVERE ALLERGIC REACTION  1 Device  6  . feeding supplement, ENSURE COMPLETE, (ENSURE COMPLETE) LIQD Take 237 mLs by mouth daily.  10 Bottle  0  . fentaNYL (DURAGESIC) 25 MCG/HR patch Place 1 patch (25 mcg total) onto the skin every 3 (three) days.  10 patch  0  . guaiFENesin-dextromethorphan (ROBITUSSIN DM) 100-10 MG/5ML syrup Take 5 mLs by mouth every 4 (four) hours as needed for cough.  118 mL  0  . levothyroxine (SYNTHROID, LEVOTHROID) 25 MCG tablet Take 25 mcg by mouth daily before breakfast.      . lidocaine (LIDODERM) 5 % Place 1 patch onto the skin daily. Remove & Discard patch within 12 hours or as directed by MD      . lidocaine-prilocaine (EMLA) cream Apply 1 application topically as needed. Apply to port 1 hr before chemo  30 g  0  . metoprolol tartrate (LOPRESSOR) 25 MG tablet Take 12.5 mg by mouth 2 (two) times daily with a meal.      . ondansetron (ZOFRAN) 4 MG tablet Take 1 tablet (4 mg total) by mouth every 6 (six) hours as needed for refractory nausea / vomiting.  20 tablet  0  . oxyCODONE-acetaminophen (PERCOCET/ROXICET) 5-325 MG per tablet Take 1 tablet by mouth every 6 (six) hours as needed for severe pain.  30 tablet  0  . pantoprazole (PROTONIX) 40 MG tablet Take 1 tablet (40 mg total) by mouth daily.  30 tablet  3  . pravastatin (PRAVACHOL) 40 MG tablet Take 1 tablet (40 mg total) by mouth every evening.  30 tablet  2  . predniSONE (DELTASONE) 2.5 MG tablet Take 3 tablets (7.5 mg total) by mouth daily with breakfast.      . PRESCRIPTION MEDICATION etoposide (VEPESID) 250 mg in sodium chloride 0.9 % 650 mL chemo infusion 120 mg/m2  2.05 m2 (Treatment Plan Actual)   Once      . PRESCRIPTION MEDICATION CISplatin (PLATINOL) 123 mg in sodium chloride 0.9 % 500 mL chemo infusion 60 mg/m2  2.05 m2 (Treatment Plan Actual)  Once      . prochlorperazine (COMPAZINE) 10 MG tablet Take 1 tablet (10 mg total) by mouth every 6 (six) hours as needed for nausea or vomiting.  30 tablet  0  . Tiotropium Bromide Monohydrate (SPIRIVA RESPIMAT) 2.5 MCG/ACT AERS Inhale 2 puffs into the lungs daily.      . [DISCONTINUED] Gabapentin, PHN, (GRALISE STARTER) 300 & 600 MG MISC Take 300-1,800 mg by mouth as  directed. 15 DAY Starter pack doses     DAY 1- 300 mg     DAY 2- 600 mg     DAY 3 to 6 - 900 mg    DAY 7 to 10 - 1200 mg     DAY 11 to 14 - 1500 mg      DAY 15 - 1800 mg        No current facility-administered medications for this visit.    SURGICAL HISTORY:  Past Surgical History  Procedure Laterality Date  . Appendectomy    . Cholecystectomy    . Lump left breast      Benign  . Abdominal hysterectomy      Tubal pregnancy  . Inguinal hernia repair    . Stomach surgery    . Ectopic pregnancy surgery    . Back surgery      x 5;1984;1989;;1999;2000;2010  . Colonoscopy  01/25/2012    Procedure: COLONOSCOPY;  Surgeon: Rogene Houston, MD;  Location: [facility];  Service: Endoscopy;  Laterality: N/A;  130  . Esophagogastroduodenoscopy (egd) with esophageal dilation N/A 08/13/2012    Procedure: ESOPHAGOGASTRODUODENOSCOPY (EGD) WITH ESOPHAGEAL DILATION;  Surgeon: Rogene Houston, MD;  Location: [facility];  Service: Endoscopy;  Laterality: N/A;  315  . Anterior cervical decomp/discectomy fusion N/A 10/21/2012    Procedure: Cervical Six-Seven Anterior cervical decompression/diskectomy/fusion;  Surgeon: Kristeen Miss, MD;  Location: [facility];  Service: Neurosurgery;  Laterality: N/A;  Cervical Six-Seven Anterior cervical decompression/diskectomy/fusion  . Cataract extraction w/phaco Right 11/25/2012    Procedure: CATARACT EXTRACTION PHACO AND INTRAOCULAR LENS PLACEMENT  (IOC);  Surgeon: Elta Guadeloupe T. Gershon Crane, MD;  Location: [facility];  Service: Ophthalmology;  Laterality: Right;  CDE:10.06  . Cataract extraction w/phaco Left 12/09/2012    Procedure: CATARACT EXTRACTION PHACO AND INTRAOCULAR LENS PLACEMENT (IOC);  Surgeon: Elta Guadeloupe T. Gershon Crane, MD;  Location: [facility];  Service: Ophthalmology;  Laterality: Left;  CDE:5.06  . Tonsillectomy    . Hiatal hernia repair    . Heller myotomy N/A 03/17/2013    Procedure: DIAGNOSTIC LAPAROSCOPY, LAPAROSCOPIC HELLER MYOTOMY, ENDOSCOPY, DOR FUNDOPLICATION;  Surgeon: Ralene Ok, MD;  Location: [facility];  Service: General;  Laterality: N/A;  . Breast surgery      Left breast  . Eye surgery    . Video bronchoscopy with endobronchial ultrasound N/A 07/24/2013    Procedure: VIDEO BRONCHOSCOPY WITH ENDOBRONCHIAL ULTRASOUND;  Surgeon: [name], MD;  Location: [facility];  Service: Thoracic;  Laterality: N/A;    REVIEW OF SYSTEMS:  Review of Systems  Constitutional: Positive for malaise/fatigue. Negative for fever, chills, weight loss and diaphoresis.  HENT: Negative for congestion, ear discharge, ear pain, hearing loss, nosebleeds, sore throat and tinnitus.        Mid esophageal pain  Eyes: Negative for blurred vision, double vision, photophobia, pain, discharge and redness.  Respiratory: Positive for shortness of breath. Negative for cough, hemoptysis, sputum production, wheezing and stridor.   Cardiovascular: Negative for chest pain, palpitations, orthopnea, claudication, leg swelling and PND.  Gastrointestinal: Negative for heartburn, nausea, vomiting, abdominal pain, diarrhea, constipation, blood in stool and melena.  Genitourinary: Negative.   Musculoskeletal: Negative.   Skin: Negative.   Neurological: Positive for weakness. Negative for dizziness, tingling, focal weakness, seizures and headaches.  Endo/Heme/Allergies: Does not bruise/bleed easily.  Psychiatric/Behavioral: Negative for depression. The patient is not nervous/anxious  and does not have insomnia.      PHYSICAL EXAMINATION: Physical Exam  Constitutional: She is oriented  to person, place, and time and well-developed, well-nourished, and in no distress.  HENT:  Head: Normocephalic and atraumatic.  Mouth/Throat: Oropharynx is clear and moist.  Eyes: Pupils are equal, round, and reactive to light.  Neck: Normal range of motion. Neck supple. No JVD present. No tracheal deviation present. No thyromegaly present.  Cardiovascular: Normal rate, regular rhythm, normal heart sounds and intact distal pulses.  Exam reveals no gallop and no friction rub.   No murmur heard. Pulmonary/Chest: Effort normal. No respiratory distress. She has no wheezes. She has no rales.  Abdominal: Soft. Bowel sounds are normal. She exhibits no distension and no mass. There is no tenderness.  Musculoskeletal: Normal range of motion. She exhibits no edema and no tenderness.  Lymphadenopathy:    She has no cervical adenopathy.  Neurological: She is alert and oriented to person, place, and time. She has normal reflexes. Gait normal.  Skin: Skin is warm and dry. No rash noted.    ECOG PERFORMANCE STATUS: 1 - Symptomatic but completely ambulatory  Blood pressure 107/93, pulse 109, temperature 98.9 F (37.2 C), temperature source Oral, resp. rate 18, height 5\' 9"  (1.753 m), weight 200 lb (90.719 kg).  LABORATORY DATA: Lab Results  Component Value Date   WBC 13.7* 09/22/2013   HGB 9.5* 09/22/2013   HCT 28.6* 09/22/2013   MCV 88.3 09/22/2013   PLT 46* 09/22/2013      Chemistry      Component Value Date/Time   NA 136 09/22/2013 0923   NA 140 09/16/2013 0610   K 4.0 09/22/2013 0923   K 4.4 09/16/2013 0610   CL 103 09/16/2013 0610   CO2 28 09/22/2013 0923   CO2 30 09/16/2013 0610   BUN 6.7* 09/22/2013 0923   BUN 16 09/16/2013 0610   CREATININE 0.8 09/22/2013 0923   CREATININE 0.72 09/16/2013 0610   CREATININE 0.86 07/15/2013 1311      Component Value Date/Time   CALCIUM 8.7 09/22/2013 0923    CALCIUM 8.4 09/16/2013 0610   ALKPHOS 90 09/22/2013 0923   ALKPHOS 85 09/15/2013 1856   AST 10 09/22/2013 0923   AST 10 09/15/2013 1856   ALT <6 09/22/2013 0923   ALT 8 09/15/2013 1856   BILITOT <0.20 09/22/2013 0923   BILITOT <0.2* 09/15/2013 1856       RADIOGRAPHIC STUDIES:  Dg Chest 2 View  08/20/2013   CLINICAL DATA:  Facial swelling.  Allergic reaction.  EXAM: CHEST  2 VIEW  COMPARISON:  PET CT scan 07/31/2013. Single view of the chest 07/24/2013. CT chest 07/14/2013.  FINDINGS: Small amount of fluid or thickening in the minor fissure is noted. Right hilar adenopathy is seen as on the prior examinations. There is some dependent atelectasis in the lung bases. No pneumothorax or pleural effusion. Right upper lobe pulmonary nodule is identified. Heart size is normal.  IMPRESSION: No acute abnormality.  Right upper lobe pulmonary nodule and right hilar lymphadenopathy are seen as on prior studies.   Electronically Signed   By: Inge Rise M.D.   On: 08/20/2013 07:21   Ct Angio Chest Pe W/cm &/or Wo Cm  08/20/2013   CLINICAL DATA:  Periorbital edema, lung cancer.  EXAM: CT ANGIOGRAPHY CHEST WITH CONTRAST  TECHNIQUE: Multidetector CT imaging of the chest was performed using the standard protocol during bolus administration of intravenous contrast. Multiplanar CT image reconstructions and MIPs were obtained to evaluate the vascular anatomy.  CONTRAST:  142mL OMNIPAQUE IOHEXOL 350 MG/ML SOLN  COMPARISON:  CT scan  of July 14, 2013.  FINDINGS: No pneumothorax or pleural effusion is noted. 11 mm nodule is noted in right upper lobe which is stable compared to prior exam. 40 x 35 mm right hilar mass is again noted consistent with malignancy. There does not appear to be significant compression or obstruction of the superior vena cava. There is no evidence of pulmonary embolus. Thoracic aorta appears normal without evidence of aneurysm or dissection. Visualized portion of upper abdomen appears normal. Mild  subsegmental atelectasis of right middle lobe is noted.  Review of the MIP images confirms the above findings.  IMPRESSION: 4 cm right hilar mass is again noted with associated 11 mm nodule in right upper lobe consistent with metastatic disease. There does not appear to be significant compression of the superior vena cava by this mass. There is no evidence of pulmonary embolus. Mild subsegmental atelectasis of right middle lobe is noted.   Electronically Signed   By: Sabino Dick M.D.   On: 08/20/2013 16:45   Ir Fluoro Guide Cv Line Right  08/27/2013   CLINICAL DATA:  Lung carcinoma, needs venous access for chemotherapy.  EXAM: TUNNELED PORT CATHETER PLACEMENT WITH ULTRASOUND AND FLUOROSCOPIC GUIDANCE  FLUOROSCOPY TIME:  48 seconds  ANESTHESIA/SEDATION: Intravenous Fentanyl and Versed were administered as conscious sedation during continuous cardiorespiratory monitoring by the radiology RN, with a total moderate sedation time of 15 minutes.  TECHNIQUE: The procedure, risks, benefits, and alternatives were explained to the patient. Questions regarding the procedure were encouraged and answered. The patient understands and consents to the procedure. As antibiotic prophylaxis, cefazolin 2 g was ordered pre-procedure and administered intravenously within one hour of incision. Patency of the right IJ vein was confirmed with ultrasound with image documentation. An appropriate skin site was determined. Skin site was marked. Region was prepped using maximum barrier technique including cap and mask, sterile gown, sterile gloves, large sterile sheet, and Chlorhexidine as cutaneous antisepsis. The region was infiltrated locally with 1% lidocaine. Under real-time ultrasound guidance, the right IJ vein was accessed with a 21 gauge micropuncture needle; the needle tip within the vein was confirmed with ultrasound image documentation. Needle was exchanged over a 018 guidewire for transitional dilator which allowed passage of  the [facility] wire into the IVC. Over this, the transitional dilator was exchanged for a 5 [place] MPA catheter. A small incision was made on the right anterior chest wall and a subcutaneous pocket fashioned. The power-injectable port was positioned and its catheter tunneled to the right IJ dermatotomy site. The MPA catheter was exchanged over an Amplatz wire for a peel-away sheath, through which the port catheter, which had been trimmed to the appropriate length, was advanced and positioned under fluoroscopy with its tip at the cavoatrial junction. Spot chest radiograph confirms good catheter position and no pneumothorax. The pocket was closed with deep interrupted and subcuticular continuous 3-0 Monocryl sutures. The port was flushed per protocol. The incisions were covered with Dermabond then covered with a sterile dressing. No immediate complication.  IMPRESSION: Technically successful right IJ power-injectable port catheter placement. Ready for routine use.   Electronically Signed   By: Arne Cleveland M.D.   On: 08/27/2013 12:37   Ir US Guide Vasc Access Right  08/27/2013   CLINICAL DATA:  Lung carcinoma, needs venous access for chemotherapy.  EXAM: TUNNELED PORT CATHETER PLACEMENT WITH ULTRASOUND AND FLUOROSCOPIC GUIDANCE  FLUOROSCOPY TIME:  48 seconds  ANESTHESIA/SEDATION: Intravenous Fentanyl and Versed were administered as conscious sedation during continuous cardiorespiratory monitoring by  the radiology RN, with a total moderate sedation time of 15 minutes.  TECHNIQUE: The procedure, risks, benefits, and alternatives were explained to the patient. Questions regarding the procedure were encouraged and answered. The patient understands and consents to the procedure. As antibiotic prophylaxis, cefazolin 2 g was ordered pre-procedure and administered intravenously within one hour of incision. Patency of the right IJ vein was confirmed with ultrasound with image documentation. An appropriate skin site was  determined. Skin site was marked. Region was prepped using maximum barrier technique including cap and mask, sterile gown, sterile gloves, large sterile sheet, and Chlorhexidine as cutaneous antisepsis. The region was infiltrated locally with 1% lidocaine. Under real-time ultrasound guidance, the right IJ vein was accessed with a 21 gauge micropuncture needle; the needle tip within the vein was confirmed with ultrasound image documentation. Needle was exchanged over a 018 guidewire for transitional dilator which allowed passage of the [facility] wire into the IVC. Over this, the transitional dilator was exchanged for a 5 [place] MPA catheter. A small incision was made on the right anterior chest wall and a subcutaneous pocket fashioned. The power-injectable port was positioned and its catheter tunneled to the right IJ dermatotomy site. The MPA catheter was exchanged over an Amplatz wire for a peel-away sheath, through which the port catheter, which had been trimmed to the appropriate length, was advanced and positioned under fluoroscopy with its tip at the cavoatrial junction. Spot chest radiograph confirms good catheter position and no pneumothorax. The pocket was closed with deep interrupted and subcuticular continuous 3-0 Monocryl sutures. The port was flushed per protocol. The incisions were covered with Dermabond then covered with a sterile dressing. No immediate complication.  IMPRESSION: Technically successful right IJ power-injectable port catheter placement. Ready for routine use.   Electronically Signed   By: Arne Cleveland M.D.   On: 08/27/2013 12:37     ASSESSMENT/PLAN:  No problem-specific assessment & plan notes found for this encounter.  Ms. Shimizu is a pleasant 58 year old female with limited stage small cell lung cancer. She is status post 2 cycles of chemotherapy with cisplatin and etoposide with neulasta support.She was just dischared form the hospital yesterday for hypotension and pancytopenia.  Her total white count and ANC have recovered and her platelet count is improving. It was 25,000 when she was discharged, now 46,000. Patient was discussed with and also seen by Dr. Julien Nordmann. In light of her recent admission,we will reschedule her upcoming restaging CT scan of the chest to 10/02/2013 and reschedule her next cycle of chemotherapy and follow up to 10/06/2013.  All questions were answered. The patient knows to call the clinic with any problems, questions or concerns. We can certainly see the patient much sooner if necessary.    Disclaimer: This note was dictated with voice recognition software. Similar sounding words can inadvertently be transcribed and may be missed upon review. Carlton Adam, PA-C 09/22/2013  ADDENDUM: Hematology/Oncology Attending:  I had a face to face encounter with the patient. I recommended to her care plan. This is a very pleasant 58 years old white female diagnosed with limited stage small cell lung cancer and currently on systemic chemotherapy with cisplatin and etoposide status post 2 cycles. She was recently admitted to [facility] with pancytopenia. The patient received Neupogen during her admission in addition to 1 units of platelets. She is recovering slowly from the pancytopenia but platelets count are still low.  We will delay the start of cycle #3 x 1 week  and the patient will have repeat CT scan of the chest for evaluation of her disease before starting the next cycle of her treatment. She would come back for follow up visit at that time. She was advised to call immediately if she has any concerning symptoms in the interval.  Disclaimer: This note was dictated with voice recognition software. Similar sounding words can inadvertently be transcribed and may be missed upon review. Eilleen Kempf., MD 09/28/2013

## 2013-09-23 ENCOUNTER — Other Ambulatory Visit: Payer: Self-pay | Admitting: Family Medicine

## 2013-09-23 ENCOUNTER — Other Ambulatory Visit: Payer: Self-pay

## 2013-09-23 ENCOUNTER — Ambulatory Visit: Payer: Medicare Other

## 2013-09-23 ENCOUNTER — Ambulatory Visit: Payer: Self-pay | Admitting: Cardiovascular Disease

## 2013-09-23 NOTE — Telephone Encounter (Signed)
Refill appropriate and filled per protocol. 

## 2013-09-24 ENCOUNTER — Ambulatory Visit: Payer: Medicare Other

## 2013-09-25 ENCOUNTER — Ambulatory Visit (HOSPITAL_COMMUNITY): Payer: Self-pay | Admitting: Psychiatry

## 2013-09-25 ENCOUNTER — Other Ambulatory Visit: Payer: Self-pay | Admitting: *Deleted

## 2013-09-25 ENCOUNTER — Encounter (HOSPITAL_COMMUNITY): Payer: Self-pay | Admitting: Psychiatry

## 2013-09-25 ENCOUNTER — Ambulatory Visit: Payer: Medicare Other

## 2013-09-25 ENCOUNTER — Telehealth: Payer: Self-pay | Admitting: Family Medicine

## 2013-09-25 ENCOUNTER — Ambulatory Visit (HOSPITAL_COMMUNITY): Admission: RE | Admit: 2013-09-25 | Payer: Medicare Other | Source: Ambulatory Visit

## 2013-09-25 ENCOUNTER — Ambulatory Visit: Payer: Self-pay

## 2013-09-25 ENCOUNTER — Telehealth: Payer: Self-pay | Admitting: Medical Oncology

## 2013-09-25 DIAGNOSIS — C3490 Malignant neoplasm of unspecified part of unspecified bronchus or lung: Secondary | ICD-10-CM

## 2013-09-25 MED ORDER — LIDOCAINE-PRILOCAINE 2.5-2.5 % EX CREA: 1.0000 "application " | TOPICAL_CREAM | CUTANEOUS | Status: AC | PRN

## 2013-09-25 MED ORDER — FENTANYL 50 MCG/HR TD PT72: 50.0000 ug | MEDICATED_PATCH | TRANSDERMAL | Status: DC

## 2013-09-25 NOTE — Telephone Encounter (Signed)
Ok to refill??  Last office visit 08/13/2013.  Last refill 09/11/2013 from Oncology.

## 2013-09-25 NOTE — Telephone Encounter (Signed)
I returned call and left message confirming that pt does not have injection appt here today.

## 2013-09-25 NOTE — Telephone Encounter (Signed)
Okay to increase to 85mcg patch every 72 hours, Kayleen Memos can pick up for pt

## 2013-09-25 NOTE — Telephone Encounter (Signed)
Call placed to [facility]. [place].

## 2013-09-25 NOTE — Telephone Encounter (Signed)
Prescription printed and patient made aware to come to office to pick up.   Call placed to patient and patient made aware.

## 2013-09-25 NOTE — Telephone Encounter (Signed)
Call received from patient.   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  States that injections are extremely painful.   Advised that MD does not recommend mixing PO medications with her pain patches. Patient requested to have patches increased.   MD please advise.

## 2013-09-25 NOTE — Telephone Encounter (Signed)
I did not prescribe her percocet, she has to call oncology for this but I would not recommend mixing these with the patches, you can call [facility] to see what is going on, I have her on Fentanyl patches, I can increase the patch if needed

## 2013-09-25 NOTE — Telephone Encounter (Signed)
Patient is calling to get rx for her percocet

## 2013-09-26 NOTE — Patient Instructions (Signed)
Your CT scan and next cycle of chemotherapy will be rescheduled since you were recently dischrged from the hospital.

## 2013-09-28 ENCOUNTER — Ambulatory Visit: Payer: Self-pay | Admitting: Cardiology

## 2013-09-28 ENCOUNTER — Ambulatory Visit: Payer: Medicare Other

## 2013-09-28 ENCOUNTER — Other Ambulatory Visit: Payer: Self-pay

## 2013-09-28 ENCOUNTER — Ambulatory Visit: Payer: Self-pay | Admitting: Oncology

## 2013-09-29 ENCOUNTER — Other Ambulatory Visit: Payer: Self-pay

## 2013-09-29 ENCOUNTER — Ambulatory Visit: Payer: Medicare Other

## 2013-09-29 ENCOUNTER — Ambulatory Visit: Payer: Self-pay

## 2013-09-30 ENCOUNTER — Ambulatory Visit: Payer: Medicare Other

## 2013-09-30 ENCOUNTER — Ambulatory Visit: Payer: Self-pay

## 2013-09-30 ENCOUNTER — Other Ambulatory Visit: Payer: Self-pay

## 2013-10-01 ENCOUNTER — Ambulatory Visit: Payer: Medicare Other

## 2013-10-01 ENCOUNTER — Other Ambulatory Visit: Payer: Self-pay

## 2013-10-01 ENCOUNTER — Ambulatory Visit: Payer: Self-pay

## 2013-10-02 ENCOUNTER — Ambulatory Visit: Payer: Medicare Other

## 2013-10-02 ENCOUNTER — Ambulatory Visit (HOSPITAL_COMMUNITY): Payer: Medicare Other

## 2013-10-02 ENCOUNTER — Encounter: Payer: Self-pay | Admitting: *Deleted

## 2013-10-02 ENCOUNTER — Ambulatory Visit: Payer: Self-pay

## 2013-10-02 NOTE — Progress Notes (Signed)
[facility] Psychosocial Distress Screening Clinical Social Work  Clinical Social Work was referred by distress screening protocol.  The patient scored a 8 on the Psychosocial Distress Thermometer which indicates severe distress. Clinical Social Worker previously tried to meet with patient to assess for distress and other psychosocial needs, but patient was admitted to inpatient.  CSW attempted to contact patient by phone.  CSW is familiar with Ms. Coltrane- she is currently followed closely by [facility].  [facility] coordinates all transportation to [place].  ONCBCN DISTRESS SCREENING 09/22/2013  Elta Guadeloupe the number that describes how much distress you have been experiencing in the past week 8  Practical problem type Transportation  Family Problem type Children  Emotional problem type Depression;Adjusting to illness;Boredom;Adjusting to appearance changes  Spiritual/Religous concerns type Facing my mortality  Information Concerns Type Lack of info about diagnosis;Lack of info about treatment  Physical Problem type Nausea/vomiting;Sleep/insomnia;Getting around;Breathing;Talking;Pain  Physician notified of physical symptoms Yes  Referral to clinical social work Yes    Clinical Social Worker follow up needed: Yes.    If yes, follow up plan:  CSW left voicemail to return call when possible.  Polo Riley, MSW, LCSW, OSW-C Clinical Social Worker [facility] [phone]

## 2013-10-05 ENCOUNTER — Ambulatory Visit: Admission: RE | Admit: 2013-10-05 | Payer: Medicare Other | Source: Ambulatory Visit

## 2013-10-05 ENCOUNTER — Other Ambulatory Visit: Payer: Self-pay

## 2013-10-05 ENCOUNTER — Ambulatory Visit: Payer: Medicare Other

## 2013-10-05 ENCOUNTER — Ambulatory Visit: Payer: Self-pay | Admitting: Physician Assistant

## 2013-10-06 ENCOUNTER — Ambulatory Visit
Admission: RE | Admit: 2013-10-06 | Discharge: 2013-10-06 | Disposition: A | Discharge status: Home / Self Care | Payer: Medicare Other | Source: Ambulatory Visit | Attending: Radiation Oncology | Admitting: Radiation Oncology

## 2013-10-06 ENCOUNTER — Ambulatory Visit (HOSPITAL_BASED_OUTPATIENT_CLINIC_OR_DEPARTMENT_OTHER): Payer: Medicare Other

## 2013-10-06 ENCOUNTER — Ambulatory Visit: Payer: Medicare Other

## 2013-10-06 ENCOUNTER — Other Ambulatory Visit: Payer: Self-pay | Admitting: *Deleted

## 2013-10-06 ENCOUNTER — Ambulatory Visit (HOSPITAL_BASED_OUTPATIENT_CLINIC_OR_DEPARTMENT_OTHER): Payer: Medicare Other | Admitting: Nurse Practitioner

## 2013-10-06 ENCOUNTER — Telehealth: Payer: Self-pay | Admitting: Medical Oncology

## 2013-10-06 ENCOUNTER — Telehealth: Payer: Self-pay | Admitting: Internal Medicine

## 2013-10-06 VITALS — BP 101/58 | HR 78 | Temp 98.5°F | Wt 202.1 lb

## 2013-10-06 VITALS — BP 92/60 | HR 88 | Temp 98.5°F | Resp 17 | Ht 69.0 in | Wt 199.9 lb

## 2013-10-06 DIAGNOSIS — C3490 Malignant neoplasm of unspecified part of unspecified bronchus or lung: Secondary | ICD-10-CM | POA: Diagnosis not present

## 2013-10-06 DIAGNOSIS — R531 Weakness: Secondary | ICD-10-CM | POA: Diagnosis not present

## 2013-10-06 DIAGNOSIS — R530 Neoplastic (malignant) related fatigue: Secondary | ICD-10-CM

## 2013-10-06 DIAGNOSIS — E8809 Other disorders of plasma-protein metabolism, not elsewhere classified: Secondary | ICD-10-CM

## 2013-10-06 DIAGNOSIS — R42 Dizziness and giddiness: Secondary | ICD-10-CM | POA: Insufficient documentation

## 2013-10-06 DIAGNOSIS — C3491 Malignant neoplasm of unspecified part of right bronchus or lung: Secondary | ICD-10-CM

## 2013-10-06 DIAGNOSIS — Z720 Tobacco use: Secondary | ICD-10-CM

## 2013-10-06 DIAGNOSIS — Z5111 Encounter for antineoplastic chemotherapy: Secondary | ICD-10-CM

## 2013-10-06 DIAGNOSIS — Z510 Encounter for antineoplastic radiation therapy: Secondary | ICD-10-CM | POA: Insufficient documentation

## 2013-10-06 DIAGNOSIS — G8929 Other chronic pain: Secondary | ICD-10-CM | POA: Diagnosis not present

## 2013-10-06 DIAGNOSIS — C3411 Malignant neoplasm of upper lobe, right bronchus or lung: Secondary | ICD-10-CM

## 2013-10-06 DIAGNOSIS — R5381 Other malaise: Secondary | ICD-10-CM

## 2013-10-06 DIAGNOSIS — R2689 Other abnormalities of gait and mobility: Secondary | ICD-10-CM

## 2013-10-06 LAB — CBC WITH DIFFERENTIAL/PLATELET
BASO%: 0.4 % (ref 0.0–2.0)
Basophils Absolute: 0 10*3/uL (ref 0.0–0.1)
EOS%: 0.3 % (ref 0.0–7.0)
Eosinophils Absolute: 0 10*3/uL (ref 0.0–0.5)
HCT: 32.6 % — ABNORMAL LOW (ref 34.8–46.6)
HGB: 10.5 g/dL — ABNORMAL LOW (ref 11.6–15.9)
LYMPH%: 5.5 % — AB (ref 14.0–49.7)
MCH: 30.1 pg (ref 25.1–34.0)
MCHC: 32.3 g/dL (ref 31.5–36.0)
MCV: 93.4 fL (ref 79.5–101.0)
MONO#: 0.5 10*3/uL (ref 0.1–0.9)
MONO%: 6.3 % (ref 0.0–14.0)
NEUT%: 87.5 % — ABNORMAL HIGH (ref 38.4–76.8)
NEUTROS ABS: 6.7 10*3/uL — AB (ref 1.5–6.5)
Platelets: 344 10*3/uL (ref 145–400)
RBC: 3.49 10*6/uL — AB (ref 3.70–5.45)
RDW: 19.5 % — ABNORMAL HIGH (ref 11.2–14.5)
WBC: 7.7 10*3/uL (ref 3.9–10.3)
lymph#: 0.4 10*3/uL — ABNORMAL LOW (ref 0.9–3.3)

## 2013-10-06 LAB — COMPREHENSIVE METABOLIC PANEL (CC13)
ALT: 7 U/L (ref 0–55)
AST: 11 U/L (ref 5–34)
Albumin: 3.1 g/dL — ABNORMAL LOW (ref 3.5–5.0)
Alkaline Phosphatase: 57 U/L (ref 40–150)
Anion Gap: 8 mEq/L (ref 3–11)
BILIRUBIN TOTAL: 0.2 mg/dL (ref 0.20–1.20)
BUN: 11.5 mg/dL (ref 7.0–26.0)
CHLORIDE: 103 meq/L (ref 98–109)
CO2: 29 mEq/L (ref 22–29)
Calcium: 9 mg/dL (ref 8.4–10.4)
Creatinine: 0.9 mg/dL (ref 0.6–1.1)
Glucose: 145 mg/dl — ABNORMAL HIGH (ref 70–140)
Potassium: 4.2 mEq/L (ref 3.5–5.1)
Sodium: 139 mEq/L (ref 136–145)
Total Protein: 6.3 g/dL — ABNORMAL LOW (ref 6.4–8.3)

## 2013-10-06 LAB — MAGNESIUM (CC13): Magnesium: 2.4 mg/dl (ref 1.5–2.5)

## 2013-10-06 MED ORDER — SODIUM CHLORIDE 0.9 % IJ SOLN
10.0000 mL | INTRAMUSCULAR | Status: DC | PRN
  Administered 2013-10-06: 10 mL
  Filled 2013-10-06: qty 10

## 2013-10-06 MED ORDER — CISPLATIN CHEMO INJECTION 100MG/100ML
60.0000 mg/m2 | Freq: Once | INTRAVENOUS | Status: AC
  Administered 2013-10-06: 123 mg via INTRAVENOUS
  Filled 2013-10-06: qty 123

## 2013-10-06 MED ORDER — POTASSIUM CHLORIDE 2 MEQ/ML IV SOLN
Freq: Once | INTRAVENOUS | Status: AC
  Administered 2013-10-06: 12:00:00 via INTRAVENOUS
  Filled 2013-10-06: qty 10

## 2013-10-06 MED ORDER — FOSAPREPITANT DIMEGLUMINE INJECTION 150 MG
150.0000 mg | Freq: Once | INTRAVENOUS | Status: AC
  Administered 2013-10-06: 150 mg via INTRAVENOUS
  Filled 2013-10-06: qty 5

## 2013-10-06 MED ORDER — SODIUM CHLORIDE 0.9 % IV SOLN
Freq: Once | INTRAVENOUS | Status: AC
  Administered 2013-10-06: 12:00:00 via INTRAVENOUS

## 2013-10-06 MED ORDER — PALONOSETRON HCL INJECTION 0.25 MG/5ML
0.2500 mg | Freq: Once | INTRAVENOUS | Status: AC
  Administered 2013-10-06: 0.25 mg via INTRAVENOUS

## 2013-10-06 MED ORDER — DEXAMETHASONE SODIUM PHOSPHATE 20 MG/5ML IJ SOLN
12.0000 mg | Freq: Once | INTRAMUSCULAR | Status: AC
Start: 2013-10-06 — End: 2013-10-06
  Administered 2013-10-06: 12 mg via INTRAVENOUS

## 2013-10-06 MED ORDER — SODIUM CHLORIDE 0.9 % IV SOLN
120.0000 mg/m2 | Freq: Once | INTRAVENOUS | Status: AC
  Administered 2013-10-06: 250 mg via INTRAVENOUS
  Filled 2013-10-06: qty 12.5

## 2013-10-06 MED ORDER — HEPARIN SOD (PORK) LOCK FLUSH 100 UNIT/ML IV SOLN
500.0000 [IU] | Freq: Once | INTRAVENOUS | Status: AC | PRN
  Administered 2013-10-06: 500 [IU]
  Filled 2013-10-06: qty 5

## 2013-10-06 NOTE — Progress Notes (Signed)
Weekly Management Note Current Dose:38 Gy  Projected Dose:60 Gy   Narrative:  The patient presents for routine under treatment assessment.  CBCT/MVCT images/Port film x-rays were reviewed.  The chart was checked. Doing well. Feeling much better after 2 week break including trip to [place]. . Hip/joints hurt despite increase in duragesic and claritin. PCP manages pain meds. No dysphagia.   Physical Findings:  Unchanged. Alert in a wheelchair.   Vitals:  Filed Vitals:   10/06/13 1547  BP: 101/58  Pulse: 78  Temp: 98.5 F (36.9 C)   Weight:  Wt Readings from Last 3 Encounters:  10/06/13 202 lb 1.6 oz (91.672 kg)  10/06/13 199 lb 14.4 oz (90.674 kg)  09/22/13 200 lb (90.719 kg)   Lab Results  Component Value Date   WBC 7.7 10/06/2013   HGB 10.5* 10/06/2013   HCT 32.6* 10/06/2013   MCV 93.4 10/06/2013   PLT 344 10/06/2013   Lab Results  Component Value Date   CREATININE 0.9 10/06/2013   BUN 11.5 10/06/2013   NA 139 10/06/2013   K 4.2 10/06/2013   CL 103 09/16/2013   CO2 29 10/06/2013     Impression:  The patient is tolerating radiation.  Plan:  Continue treatment as planned. Discuss pain meds with PCP.

## 2013-10-06 NOTE — Progress Notes (Signed)
[place]   Chief Complaint  Patient presents with  . Follow-up    HPI: Tonya Sullivan 58 y.o. female diagnosed with lung cancer.  Currently undergoing cisplatin/etoposide chemotherapy regimen and radiation therapy.  Patient presents today for followup of her lung cancer and to receive cycle 3, day 1 of her cisplatin/etoposide chemotherapy regimen.  Patient states that her chemotherapy has been on hold for the past few weeks she can make a trip to [place] to visit her family.  She states she is unaware that she missed a restaging scan last week as well.  Patient states she's been feeling very fatigued lately.  She is complaining of some generalized deconditioning in decreased mobility.  She uses a walker for ambulation now.  She's also complaining of some chronic pain.  She states her permit her provider just increased her fentanyl patch from 25 mcg up to 50 mcg.  She is requesting some Percocet for breakthrough pain.  She states that her primary care provider prefers that she not take any oral medication in conjunction with her fentanyl patch; but states that she does occasionally need something for breakthrough pain.  Patient denies any other new symptoms whatsoever.  She denies any recent fevers or chills.   HPI  CURRENT THERAPY: Upcoming Treatment Dates - LUNG SMALL CELL - LIMITED STAGE Cisplatin D1 / Etoposide D1-3 q21d Days with orders from any treatment category:  10/07/2013      SCHEDULING COMMUNICATION      dexamethasone (DECADRON) injection 10 mg      etoposide (VEPESID) 250 mg in sodium chloride 0.9 % 650 mL chemo infusion      sodium chloride 0.9 % injection 10 mL      heparin lock flush 100 unit/mL      heparin lock flush 100 unit/mL      alteplase (CATHFLO ACTIVASE) injection 2 mg      sodium chloride 0.9 % injection 3 mL      Hot Pack 1 packet      0.9 %  sodium chloride infusion 10/08/2013      SCHEDULING COMMUNICATION      dexamethasone  (DECADRON) injection 10 mg      etoposide (VEPESID) 250 mg in sodium chloride 0.9 % 650 mL chemo infusion      sodium chloride 0.9 % injection 10 mL      heparin lock flush 100 unit/mL      heparin lock flush 100 unit/mL      alteplase (CATHFLO ACTIVASE) injection 2 mg      sodium chloride 0.9 % injection 3 mL      Hot Pack 1 packet      0.9 %  sodium chloride infusion 10/09/2013      SCHEDULING COMMUNICATION INJECTION      pegfilgrastim (NEULASTA) injection 6 mg    ROS  Past Medical History  Diagnosis Date  . COPD (chronic obstructive pulmonary disease)   . Depression   . Chronic back pain     DDD, disc bulge, radiculopathy, spinal stenosis  . Hyperlipidemia   . Anxiety   . Bipolar disorder, unspecified   . Carotid artery calcification   . DDD (degenerative disc disease), lumbosacral   . Spinal stenosis, lumbar   . Autoimmune thyroiditis   . Essential hypertension, benign   . TIA (transient ischemic attack)     August 2014  . GERD (gastroesophageal reflux disease)   . Neuropathic pain   . Addison's disease  On Prednisone  . History of pneumonia   . Achalasia   . Melanoma   . Frequent falls   . DM (diabetes mellitus) with complications   . 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 Hypothyroidism     Past Surgical History  Procedure Laterality Date  . Appendectomy    . Cholecystectomy    . Lump left breast      Benign  . Abdominal hysterectomy      Tubal pregnancy  . Inguinal hernia repair    . Stomach surgery    . Ectopic pregnancy surgery    . Back surgery      x 5;1984;1989;;1999;2000;2010  . Colonoscopy  01/25/2012    Procedure: COLONOSCOPY;  Surgeon: Rogene Houston, MD;  [facility]: [facility];  Service: Endoscopy;  Laterality: N/A;  130  . Esophagogastroduodenoscopy (egd) with esophageal dilation N/A 08/13/2012    Procedure: ESOPHAGOGASTRODUODENOSCOPY (EGD) WITH ESOPHAGEAL DILATION;  Surgeon: Rogene Houston, MD;  Location: [facility];  Service:  Endoscopy;  Laterality: N/A;  315  . Anterior cervical decomp/discectomy fusion N/A 10/21/2012    Procedure: Cervical Six-Seven Anterior cervical decompression/diskectomy/fusion;  Surgeon: Kristeen Miss, MD;  Location: [facility];  Service: Neurosurgery;  Laterality: N/A;  Cervical Six-Seven Anterior cervical decompression/diskectomy/fusion  . Cataract extraction w/phaco Right 11/25/2012    Procedure: CATARACT EXTRACTION PHACO AND INTRAOCULAR LENS PLACEMENT (IOC);  Surgeon: Elta Guadeloupe T. Gershon Crane, MD;  Location: [facility];  Service: Ophthalmology;  Laterality: Right;  CDE:10.06  . Cataract extraction w/phaco Left 12/09/2012    Procedure: CATARACT EXTRACTION PHACO AND INTRAOCULAR LENS PLACEMENT (IOC);  Surgeon: Elta Guadeloupe T. Gershon Crane, MD;  Location: [facility];  Service: Ophthalmology;  Laterality: Left;  CDE:5.06  . Tonsillectomy    . Hiatal hernia repair    . Heller myotomy N/A 03/17/2013    Procedure: DIAGNOSTIC LAPAROSCOPY, LAPAROSCOPIC HELLER MYOTOMY, ENDOSCOPY, DOR FUNDOPLICATION;  Surgeon: Ralene Ok, MD;  Location: [facility];  Service: General;  Laterality: N/A;  . Breast surgery      Left breast  . Eye surgery    . Video bronchoscopy with endobronchial ultrasound N/A 07/24/2013    Procedure: VIDEO BRONCHOSCOPY WITH ENDOBRONCHIAL ULTRASOUND;  Surgeon: [name], MD;  Location: [facility];  Service: Thoracic;  Laterality: N/A;    has Hyperlipidemia; Depression; Chronic pain; COPD (chronic obstructive pulmonary disease); Tobacco use; Chronic back pain; Bipolar disorder; Carotid stenosis; Essential hypertension; Mole of skin; Fall; Hoarse voice quality; Spontaneous ecchymoses; TIA (transient ischemic attack); Dysarthria; Stricture and stenosis of esophagus; Dysphagia; Peripheral edema; Urinary incontinence; Adrenal cortical insufficiency; Cervical spondylosis without myelopathy; Gait disturbance; Chalasia of lower esophageal sphincter; MDD (major depressive disorder), recurrent severe, without psychosis; PTSD  (post-traumatic stress disorder); Unspecified episodic mood disorder; Memory problem; Lung mass; GERD (gastroesophageal reflux disease); Small cell carcinoma of lung; Elevated troponin; Facial swelling; Anemia, unspecified; Obesity, morbid; HTN (hypertension); Hypothyroidism; Hypotension; Unspecified protein-calorie malnutrition; Nausea alone; Low BP; Fatigue; Decreased mobility; Physical deconditioning; and Hypoalbuminemia on her problem list.     is allergic to codeine; cyclobenzaprine; darvocet; abilify; ace inhibitors; adhesive; gabapentin; iodine; levofloxacin; prednisone; sulfa antibiotics; and robaxin.    Medication List       This list is accurate as of: 10/06/13 11:59 PM.  Always use your most recent med list.               albuterol 108 (90 BASE) MCG/ACT inhaler  Commonly known as:  PROVENTIL HFA;VENTOLIN HFA  Inhale 2 puffs into the lungs 3 (three) times  daily as needed for wheezing or shortness of breath.     amitriptyline 100 MG tablet  Commonly known as:  ELAVIL  Take 1 tablet (100 mg total) by mouth at bedtime.     clonazePAM 1 MG tablet  Commonly known as:  KLONOPIN  Take 1 tablet (1 mg total) by mouth 4 (four) times daily.     dexlansoprazole 60 MG capsule  Commonly known as:  DEXILANT  Take 1 capsule (60 mg total) by mouth daily.     divalproex 500 MG 24 hr tablet  Commonly known as:  DEPAKOTE ER  Take 1 tablet (500 mg total) by mouth daily.     donepezil 5 MG tablet  Commonly known as:  ARICEPT  Take 1 tablet (5 mg total) by mouth at bedtime.     DULoxetine 60 MG capsule  Commonly known as:  CYMBALTA  Take 1 capsule (60 mg total) by mouth every morning.     emollient cream  Commonly known as:  BIAFINE  Apply 1 application topically 2 (two) times daily. Apply to affected chest area after rad tx and bedtime, just not 4 hours prior to having rad txs, also use on the weekends     EPINEPHrine 0.3 mg/0.3 mL Soaj injection  Commonly known as:  EPIPEN  Inject  0.3 mLs (0.3 mg total) into the muscle once. PRN FOR SEVERE ALLERGIC REACTION     feeding supplement (ENSURE COMPLETE) Liqd  Take 237 mLs by mouth daily.     fentaNYL 50 MCG/HR  Commonly known as:  DURAGESIC - dosed mcg/hr  Place 1 patch (50 mcg total) onto the skin every 3 (three) days.     levothyroxine 25 MCG tablet  Commonly known as:  SYNTHROID, LEVOTHROID  Take 25 mcg by mouth daily before breakfast.     lidocaine 5 %  Commonly known as:  LIDODERM  Place 1 patch onto the skin daily. Remove & Discard patch within 12 hours or as directed by MD     lidocaine-prilocaine cream  Commonly known as:  EMLA  Apply 1 application topically as needed. Apply to port 1 hr before chemo     metoprolol tartrate 25 MG tablet  Commonly known as:  LOPRESSOR  Take 12.5 mg by mouth 2 (two) times daily with a meal.     ondansetron 4 MG tablet  Commonly known as:  ZOFRAN  Take 1 tablet (4 mg total) by mouth every 6 (six) hours as needed for refractory nausea / vomiting.     oxyCODONE-acetaminophen 5-325 MG per tablet  Commonly known as:  PERCOCET/ROXICET  Take 1 tablet by mouth every 6 (six) hours as needed for severe pain.     pantoprazole 40 MG tablet  Commonly known as:  PROTONIX  Take 1 tablet (40 mg total) by mouth daily.     pravastatin 40 MG tablet  Commonly known as:  PRAVACHOL  TAKE (1) TABLET BY MOUTH EVERY EVENING FOR HIGH CHOLESTEROL.     predniSONE 2.5 MG tablet  Commonly known as:  DELTASONE  Take 3 tablets (7.5 mg total) by mouth daily with breakfast.     prochlorperazine 10 MG tablet  Commonly known as:  COMPAZINE  Take 1 tablet (10 mg total) by mouth every 6 (six) hours as needed for nausea or vomiting.     SPIRIVA RESPIMAT 2.5 MCG/ACT Aers  Generic drug:  Tiotropium Bromide Monohydrate  Inhale 2 puffs into the lungs daily.  PHYSICAL EXAMINATION  Blood pressure 92/60, pulse 88, temperature 98.5 F (36.9 C), temperature source Oral, resp. rate 17, height  5\' 9"  (1.753 m), weight 199 lb 14.4 oz (90.674 kg), SpO2 95.00%.  Physical Exam  Nursing note and vitals reviewed. Constitutional: She is oriented to person, place, and time and well-developed, well-nourished, and in no distress.  HENT:  Head: Normocephalic.  Mouth/Throat: Oropharynx is clear and moist.  Eyes: Conjunctivae and EOM are normal. Pupils are equal, round, and reactive to light. Right eye exhibits no discharge. Left eye exhibits no discharge. No scleral icterus.  Neck: Normal range of motion. Neck supple. No JVD present. No tracheal deviation present. No thyromegaly present.  Cardiovascular: Normal rate, regular rhythm, normal heart sounds and intact distal pulses.  Exam reveals no friction rub.   No murmur heard. Pulmonary/Chest: Effort normal and breath sounds normal. No respiratory distress. She has no wheezes.  Abdominal: Soft. Bowel sounds are normal. She exhibits no distension. There is no tenderness. There is no rebound.  Musculoskeletal: Normal range of motion. She exhibits no edema and no tenderness.  Lymphadenopathy:    She has no cervical adenopathy.  Neurological: She is oriented to person, place, and time. Gait normal.  Skin: Skin is warm. No rash noted. No erythema.  Psychiatric:  Patient appears slightly sedated on exam today.  However, patient was able to follow all commands and answer all questions appropriately.    LABORATORY DATA:. CBC  Lab Results  Component Value Date   WBC 8.0 10/07/2013   RBC 3.37* 10/07/2013   HGB 10.1* 10/07/2013   HCT 31.5* 10/07/2013   PLT 314 10/07/2013   MCV 93.3 10/07/2013   MCH 30.0 10/07/2013   MCHC 32.1 10/07/2013   RDW 19.0* 10/07/2013   LYMPHSABS 0.3* 10/07/2013   MONOABS 0.5 10/07/2013   EOSABS 0.0 10/07/2013   BASOSABS 0.0 10/07/2013     CMET  Lab Results  Component Value Date   NA 138 10/07/2013   K 5.0 10/07/2013   CL 103 09/16/2013   CO2 28 10/07/2013   GLUCOSE 102 10/07/2013   BUN 15.4 10/07/2013   CREATININE 0.8  10/07/2013   CALCIUM 9.1 10/07/2013   PROT 6.3* 10/07/2013   ALBUMIN 3.1* 10/07/2013   AST 18 10/07/2013   ALT 17 10/07/2013   ALKPHOS 58 10/07/2013   BILITOT 0.41 10/07/2013   GFRNONAA >90 09/16/2013   GFRAA >90 09/16/2013    ASSESSMENT/PLAN:    Chronic pain  Assessment & Plan Patient's primary care provider has increased patient's Fentanyl patch from 25 mcg up to 50 mcg; but patient continues to request Percocet for occasional breakthrough pain.  Patient's primary care provider notes state that they prefer to hold on any oral pain medication; and to titrate up on the fentanyl patch instead.  Patient was encouraged to follow up with her primary care provider in regards to pain medication management.  Also, patient was encouraged to take Claritin prior to receiving her Neulasta injection and for a few days following the Neulasta injection.    Tobacco use  Assessment & Plan Patient continues to smoke on a daily basis; but states she has decreased her smoking down to approximately one quarter of a pack per day.   Small cell carcinoma of lung  Assessment & Plan Patient's chemotherapy was held for the past few weeks so patient could travel to [place] to visit family.  Patient also missed her scheduled restaging scan. Patient will resume her cisplatin/etoposide chemotherapy regimen today as  previously directed.  Patient return both Wednesday and Thursday of this week for cycle 3, day 2 and 3 of the same regimen.  She'll return on 10/09/2013 for her Neulasta injection.  Her next cycle of chemotherapy will be due on 10/27/2013.  She will also continue with labs weekly with chemotherapy.  We will also reschedule her restaging chest CT with contrast.   Fatigue  Assessment & Plan Patient continues to complain of chronic fatigue symptoms.  The patient was encouraged to remain as active as possible.  May also want to consider a physical therapy referral so patient can perhaps regain some  strength.   Decreased mobility  Assessment & Plan Most likely secondary to chemotherapy-patient is experiencing some generalized fatigue, deconditioning, and decreased mobility.  She uses a walker for ambulation.  She denies any recent falls.     Physical deconditioning  Assessment & Plan Patient does appear deconditioned; most likely secondary to her chemotherapy.  Will order a physical therapy referral for strength training.  Patient does have some transportation issues-so will try to obtain physical therapy home consult.   Hypoalbuminemia  Assessment & Plan Patient's albumin is low today.  Patient was encouraged to push protein in her diet.    Patient stated understanding of all instructions; and was in agreement with this plan of care. The patient knows to call the clinic with any problems, questions or concerns.   This was a shared visit with Dr. Julien Nordmann today.  Total time spent with patient was 25 minutes;  with greater than 75 percent of that time spent in face to face counseling regarding her symptoms, and coordination of care and follow up.  Disclaimer: This note was dictated with voice recognition software. Similar sounding words can inadvertently be transcribed and may not be corrected upon review.   Drue Second, NP 10/08/2013   ADDENDUM: Hematology/Oncology Attending: I had a face to face encounter with the patient. I recommended her care plan. This is a very pleasant 58 years old white female with limited stage small cell lung cancer currently undergoing systemic chemotherapy with cisplatin and etoposide status post 2 cycles. The patient was supposed to have repeat CT scan of the chest before starting cycle #3 but she missed her appointment. I will schedule her to have the repeat CT scan of the chest in the next few days. We will proceed with cycle #3 today as scheduled. The patient would come back for followup visit in 3 weeks for reevaluation and management any  adverse effect of her treatment. She was advised to call immediately if she has any concerning symptoms in the interval.  Disclaimer: This note was dictated with voice recognition software. Similar sounding words can inadvertently be transcribed and may be missed upon review. Eilleen Kempf., MD 10/10/2013

## 2013-10-06 NOTE — Patient Instructions (Signed)
[facility] Discharge Instructions for Patients Receiving Chemotherapy  Today you received the following chemotherapy agents :  Cisplatin & VP-16  To help prevent nausea and vomiting after your treatment, we encourage you to take your nausea medication.   If you develop nausea and vomiting that is not controlled by your nausea medication, call the clinic.   BELOW ARE SYMPTOMS THAT SHOULD BE REPORTED IMMEDIATELY:  *FEVER GREATER THAN 100.5 F  *CHILLS WITH OR WITHOUT FEVER  NAUSEA AND VOMITING THAT IS NOT CONTROLLED WITH YOUR NAUSEA MEDICATION  *UNUSUAL SHORTNESS OF BREATH  *UNUSUAL BRUISING OR BLEEDING  TENDERNESS IN MOUTH AND THROAT WITH OR WITHOUT PRESENCE OF ULCERS  *URINARY PROBLEMS  *BOWEL PROBLEMS  UNUSUAL RASH Items with * indicate a potential emergency and should be followed up as soon as possible.  Feel free to call the clinic you have any questions or concerns. The clinic phone number is [phone].

## 2013-10-06 NOTE — Telephone Encounter (Signed)
added appt for [facility]...done...pt here

## 2013-10-06 NOTE — Telephone Encounter (Signed)
I left message again for pt to return my call about no show for CT.

## 2013-10-07 ENCOUNTER — Ambulatory Visit: Payer: Medicare Other

## 2013-10-07 ENCOUNTER — Ambulatory Visit (HOSPITAL_BASED_OUTPATIENT_CLINIC_OR_DEPARTMENT_OTHER): Payer: Medicare Other

## 2013-10-07 ENCOUNTER — Ambulatory Visit
Admission: RE | Admit: 2013-10-07 | Discharge: 2013-10-07 | Disposition: A | Discharge status: Home / Self Care | Payer: Medicare Other | Source: Ambulatory Visit | Attending: Radiation Oncology | Admitting: Radiation Oncology

## 2013-10-07 VITALS — BP 107/70 | HR 84 | Temp 98.2°F | Resp 18

## 2013-10-07 DIAGNOSIS — Z95828 Presence of other vascular implants and grafts: Secondary | ICD-10-CM

## 2013-10-07 DIAGNOSIS — Z5111 Encounter for antineoplastic chemotherapy: Secondary | ICD-10-CM

## 2013-10-07 DIAGNOSIS — C3491 Malignant neoplasm of unspecified part of right bronchus or lung: Secondary | ICD-10-CM

## 2013-10-07 DIAGNOSIS — Z510 Encounter for antineoplastic radiation therapy: Secondary | ICD-10-CM | POA: Diagnosis not present

## 2013-10-07 DIAGNOSIS — C3411 Malignant neoplasm of upper lobe, right bronchus or lung: Secondary | ICD-10-CM

## 2013-10-07 LAB — COMPREHENSIVE METABOLIC PANEL (CC13)
ALK PHOS: 58 U/L (ref 40–150)
ALT: 17 U/L (ref 0–55)
AST: 18 U/L (ref 5–34)
Albumin: 3.1 g/dL — ABNORMAL LOW (ref 3.5–5.0)
Anion Gap: 8 mEq/L (ref 3–11)
BILIRUBIN TOTAL: 0.41 mg/dL (ref 0.20–1.20)
BUN: 15.4 mg/dL (ref 7.0–26.0)
CO2: 28 mEq/L (ref 22–29)
Calcium: 9.1 mg/dL (ref 8.4–10.4)
Chloride: 102 mEq/L (ref 98–109)
Creatinine: 0.8 mg/dL (ref 0.6–1.1)
Glucose: 102 mg/dl (ref 70–140)
Potassium: 5 mEq/L (ref 3.5–5.1)
SODIUM: 138 meq/L (ref 136–145)
TOTAL PROTEIN: 6.3 g/dL — AB (ref 6.4–8.3)

## 2013-10-07 LAB — CBC WITH DIFFERENTIAL/PLATELET
BASO%: 0.1 % (ref 0.0–2.0)
BASOS ABS: 0 10*3/uL (ref 0.0–0.1)
EOS%: 0 % (ref 0.0–7.0)
Eosinophils Absolute: 0 10*3/uL (ref 0.0–0.5)
HCT: 31.5 % — ABNORMAL LOW (ref 34.8–46.6)
HEMOGLOBIN: 10.1 g/dL — AB (ref 11.6–15.9)
LYMPH%: 3.7 % — ABNORMAL LOW (ref 14.0–49.7)
MCH: 30 pg (ref 25.1–34.0)
MCHC: 32.1 g/dL (ref 31.5–36.0)
MCV: 93.3 fL (ref 79.5–101.0)
MONO#: 0.5 10*3/uL (ref 0.1–0.9)
MONO%: 6.6 % (ref 0.0–14.0)
NEUT#: 7.2 10*3/uL — ABNORMAL HIGH (ref 1.5–6.5)
NEUT%: 89.6 % — AB (ref 38.4–76.8)
Platelets: 314 10*3/uL (ref 145–400)
RBC: 3.37 10*6/uL — ABNORMAL LOW (ref 3.70–5.45)
RDW: 19 % — AB (ref 11.2–14.5)
WBC: 8 10*3/uL (ref 3.9–10.3)
lymph#: 0.3 10*3/uL — ABNORMAL LOW (ref 0.9–3.3)

## 2013-10-07 LAB — MAGNESIUM (CC13): MAGNESIUM: 2.6 mg/dL — AB (ref 1.5–2.5)

## 2013-10-07 MED ORDER — HEPARIN SOD (PORK) LOCK FLUSH 100 UNIT/ML IV SOLN
500.0000 [IU] | Freq: Once | INTRAVENOUS | Status: AC | PRN
  Administered 2013-10-07: 500 [IU]
  Filled 2013-10-07: qty 5

## 2013-10-07 MED ORDER — DEXAMETHASONE SODIUM PHOSPHATE 10 MG/ML IJ SOLN
10.0000 mg | Freq: Once | INTRAMUSCULAR | Status: AC
  Administered 2013-10-07: 10 mg via INTRAVENOUS

## 2013-10-07 MED ORDER — SODIUM CHLORIDE 0.9 % IJ SOLN
10.0000 mL | INTRAMUSCULAR | Status: DC | PRN
  Administered 2013-10-07: 10 mL
  Filled 2013-10-07: qty 10

## 2013-10-07 MED ORDER — SODIUM CHLORIDE 0.9 % IV SOLN
120.0000 mg/m2 | Freq: Once | INTRAVENOUS | Status: AC
  Administered 2013-10-07: 250 mg via INTRAVENOUS
  Filled 2013-10-07: qty 12.5

## 2013-10-07 MED ORDER — SODIUM CHLORIDE 0.9 % IV SOLN
Freq: Once | INTRAVENOUS | Status: AC
  Administered 2013-10-07: 14:00:00 via INTRAVENOUS

## 2013-10-07 MED ORDER — SODIUM CHLORIDE 0.9 % IJ SOLN
10.0000 mL | INTRAMUSCULAR | Status: DC | PRN
  Administered 2013-10-07: 10 mL via INTRAVENOUS
  Filled 2013-10-07: qty 10

## 2013-10-07 MED ORDER — DEXAMETHASONE SODIUM PHOSPHATE 10 MG/ML IJ SOLN
INTRAMUSCULAR | Status: AC
  Filled 2013-10-07: qty 1

## 2013-10-07 NOTE — Progress Notes (Signed)
Patient requested that she remain accessed for tomorrows treatment.

## 2013-10-07 NOTE — Patient Instructions (Signed)
[place] Discharge Instructions for Patients Receiving Chemotherapy  Today you received the following chemotherapy agents :  VP-16  To help prevent nausea and vomiting after your treatment, we encourage you to take your nausea medication - zofran and compazine.   If you develop nausea and vomiting that is not controlled by your nausea medication, call the clinic.   BELOW ARE SYMPTOMS THAT SHOULD BE REPORTED IMMEDIATELY:  *FEVER GREATER THAN 100.5 F  *CHILLS WITH OR WITHOUT FEVER  NAUSEA AND VOMITING THAT IS NOT CONTROLLED WITH YOUR NAUSEA MEDICATION  *UNUSUAL SHORTNESS OF BREATH  *UNUSUAL BRUISING OR BLEEDING  TENDERNESS IN MOUTH AND THROAT WITH OR WITHOUT PRESENCE OF ULCERS  *URINARY PROBLEMS  *BOWEL PROBLEMS  UNUSUAL RASH Items with * indicate a potential emergency and should be followed up as soon as possible.  Feel free to call the clinic you have any questions or concerns. The clinic phone number is [phone].

## 2013-10-07 NOTE — Patient Instructions (Signed)

## 2013-10-08 ENCOUNTER — Ambulatory Visit: Payer: Medicare Other

## 2013-10-08 ENCOUNTER — Ambulatory Visit (HOSPITAL_BASED_OUTPATIENT_CLINIC_OR_DEPARTMENT_OTHER): Payer: Medicare Other

## 2013-10-08 ENCOUNTER — Ambulatory Visit (HOSPITAL_BASED_OUTPATIENT_CLINIC_OR_DEPARTMENT_OTHER): Payer: Medicare Other | Admitting: Nurse Practitioner

## 2013-10-08 ENCOUNTER — Ambulatory Visit
Admission: RE | Admit: 2013-10-08 | Discharge: 2013-10-08 | Disposition: A | Discharge status: Home / Self Care | Payer: Medicare Other | Source: Ambulatory Visit | Attending: Radiation Oncology | Admitting: Radiation Oncology

## 2013-10-08 ENCOUNTER — Encounter: Payer: Self-pay | Admitting: Nurse Practitioner

## 2013-10-08 VITALS — BP 137/85 | HR 92 | Temp 98.0°F | Resp 20

## 2013-10-08 DIAGNOSIS — I6529 Occlusion and stenosis of unspecified carotid artery: Secondary | ICD-10-CM

## 2013-10-08 DIAGNOSIS — R220 Localized swelling, mass and lump, head: Secondary | ICD-10-CM

## 2013-10-08 DIAGNOSIS — Z5111 Encounter for antineoplastic chemotherapy: Secondary | ICD-10-CM | POA: Diagnosis present

## 2013-10-08 DIAGNOSIS — R5381 Other malaise: Secondary | ICD-10-CM | POA: Insufficient documentation

## 2013-10-08 DIAGNOSIS — E8809 Other disorders of plasma-protein metabolism, not elsewhere classified: Secondary | ICD-10-CM | POA: Insufficient documentation

## 2013-10-08 DIAGNOSIS — C3490 Malignant neoplasm of unspecified part of unspecified bronchus or lung: Secondary | ICD-10-CM

## 2013-10-08 DIAGNOSIS — C3491 Malignant neoplasm of unspecified part of right bronchus or lung: Secondary | ICD-10-CM

## 2013-10-08 DIAGNOSIS — Z510 Encounter for antineoplastic radiation therapy: Secondary | ICD-10-CM | POA: Diagnosis not present

## 2013-10-08 DIAGNOSIS — T7840XA Allergy, unspecified, initial encounter: Secondary | ICD-10-CM

## 2013-10-08 DIAGNOSIS — C801 Malignant (primary) neoplasm, unspecified: Secondary | ICD-10-CM

## 2013-10-08 DIAGNOSIS — R5383 Other fatigue: Secondary | ICD-10-CM | POA: Insufficient documentation

## 2013-10-08 DIAGNOSIS — R2689 Other abnormalities of gait and mobility: Secondary | ICD-10-CM | POA: Insufficient documentation

## 2013-10-08 MED ORDER — SODIUM CHLORIDE 0.9 % IV SOLN
Freq: Once | INTRAVENOUS | Status: AC
  Administered 2013-10-08: 13:00:00 via INTRAVENOUS

## 2013-10-08 MED ORDER — ALUM & MAG HYDROXIDE-SIMETH 200-200-20 MG/5ML PO SUSP
30.0000 mL | Freq: Once | ORAL | Status: AC
  Administered 2013-10-08: 30 mL via ORAL
  Filled 2013-10-08: qty 30

## 2013-10-08 MED ORDER — HEPARIN SOD (PORK) LOCK FLUSH 100 UNIT/ML IV SOLN
500.0000 [IU] | Freq: Once | INTRAVENOUS | Status: AC | PRN
  Administered 2013-10-08: 500 [IU]
  Filled 2013-10-08: qty 5

## 2013-10-08 MED ORDER — FAMOTIDINE IN NACL 20-0.9 MG/50ML-% IV SOLN
20.0000 mg | Freq: Two times a day (BID) | INTRAVENOUS | Status: DC
  Administered 2013-10-08: 20 mg via INTRAVENOUS

## 2013-10-08 MED ORDER — FAMOTIDINE IN NACL 20-0.9 MG/50ML-% IV SOLN
INTRAVENOUS | Status: AC
  Filled 2013-10-08: qty 50

## 2013-10-08 MED ORDER — PROCHLORPERAZINE MALEATE 10 MG PO TABS
ORAL_TABLET | ORAL | Status: AC
  Filled 2013-10-08: qty 1

## 2013-10-08 MED ORDER — PROCHLORPERAZINE MALEATE 10 MG PO TABS
10.0000 mg | ORAL_TABLET | Freq: Once | ORAL | Status: AC
  Administered 2013-10-08: 10 mg via ORAL

## 2013-10-08 MED ORDER — SODIUM CHLORIDE 0.9 % IJ SOLN
10.0000 mL | INTRAMUSCULAR | Status: DC | PRN
  Administered 2013-10-08: 10 mL
  Filled 2013-10-08: qty 10

## 2013-10-08 MED ORDER — DIPHENHYDRAMINE HCL 50 MG/ML IJ SOLN
25.0000 mg | Freq: Once | INTRAMUSCULAR | Status: AC
  Administered 2013-10-08: 25 mg via INTRAVENOUS

## 2013-10-08 MED ORDER — DEXAMETHASONE SODIUM PHOSPHATE 10 MG/ML IJ SOLN: 10.0000 mg | Freq: Once | INTRAMUSCULAR | Status: DC

## 2013-10-08 MED ORDER — DIPHENHYDRAMINE HCL 50 MG/ML IJ SOLN
INTRAMUSCULAR | Status: AC
  Filled 2013-10-08: qty 1

## 2013-10-08 MED ORDER — SODIUM CHLORIDE 0.9 % IV SOLN
120.0000 mg/m2 | Freq: Once | INTRAVENOUS | Status: AC
  Administered 2013-10-08: 250 mg via INTRAVENOUS
  Filled 2013-10-08: qty 12.5

## 2013-10-08 NOTE — Assessment & Plan Note (Signed)
Most likely secondary to chemotherapy-patient is experiencing some generalized fatigue, deconditioning, and decreased mobility.  She uses a walker for ambulation.  She denies any recent falls.

## 2013-10-08 NOTE — Assessment & Plan Note (Signed)
Patient's chemotherapy was held for the past few weeks so patient could travel to [place] to visit family.  Patient also missed her scheduled restaging scan. Patient will resume her cisplatin/etoposide chemotherapy regimen today as previously directed.  Patient return both Wednesday and Thursday of this week for cycle 3, day 2 and 3 of the same regimen.  She'll return on 10/09/2013 for her Neulasta injection.  Her next cycle of chemotherapy will be due on 10/27/2013.  She will also continue with labs weekly with chemotherapy.  We will also reschedule her restaging chest CT with contrast.

## 2013-10-08 NOTE — Progress Notes (Signed)
[employer]   Chief Complaint  Patient presents with  . Follow-up    HPI: Tonya Sullivan 58 y.o. female diagnosed with lung cancer.  Currently undergoing cisplatin/etoposide chemotherapy regimen; as well as radiation therapy.  Patient presented today to receive cycle 3, day 3 of the etoposide only portion of her cisplatin/etoposide chemotherapy regimen.  She states she woke up this morning with some increased facial swelling.  She denies any other allergic type symptoms whatsoever.  The patient states that this facial swelling is typical when she takes large amounts of steroids.  Patient states that she typically takes prednisone 7.5 mg on a daily basis for previously diagnosed adrenal issue.  Patient states that she is " not actually allergic to steroids; but it does always caused facial edema."   Premedication dexamethasone 10 mg IV was held today prior to chemotherapy.  Patient was given Compazine 10 mg orally instead.  Patient appears to have tolerated the etoposide fairly well today with no nausea or vomiting.  Patient's facial edema did gradually resolve after patient received both Benadryl 25 mg and Pepcid 20 mg IV.   HPI  CURRENT THERAPY: Upcoming Treatment Dates - LUNG SMALL CELL - LIMITED STAGE Cisplatin D1 / Etoposide D1-3 q21d Days with orders from any treatment category:  10/09/2013      SCHEDULING COMMUNICATION INJECTION      pegfilgrastim (NEULASTA) injection 6 mg 10/27/2013      SCHEDULING COMMUNICATION      palonosetron (ALOXI) injection 0.25 mg      Dexamethasone Sodium Phosphate (DECADRON) injection 12 mg      fosaprepitant (EMEND) 150 mg in sodium chloride 0.9 % 145 mL IVPB      CISplatin (PLATINOL) 123 mg in sodium chloride 0.9 % 500 mL chemo infusion      etoposide (VEPESID) 250 mg in sodium chloride 0.9 % 650 mL chemo infusion      sodium chloride 0.9 % injection 10 mL      heparin lock flush 100 unit/mL      heparin lock flush 100 unit/mL     alteplase (CATHFLO ACTIVASE) injection 2 mg      sodium chloride 0.9 % injection 3 mL      Hot Pack 1 packet      0.9 %  sodium chloride infusion      dextrose 5 % and 0.45% NaCl 1,000 mL with potassium chloride 20 mEq, magnesium sulfate 12 mEq, mannitol 12.5 g infusion      TREATMENT CONDITIONS 10/28/2013      SCHEDULING COMMUNICATION      dexamethasone (DECADRON) injection 10 mg      etoposide (VEPESID) 250 mg in sodium chloride 0.9 % 650 mL chemo infusion      sodium chloride 0.9 % injection 10 mL      heparin lock flush 100 unit/mL      heparin lock flush 100 unit/mL      alteplase (CATHFLO ACTIVASE) injection 2 mg      sodium chloride 0.9 % injection 3 mL      Hot Pack 1 packet      0.9 %  sodium chloride infusion    ROS  Past Medical History  Diagnosis Date  . COPD (chronic obstructive pulmonary disease)   . Depression   . Chronic back pain     DDD, disc bulge, radiculopathy, spinal stenosis  . Hyperlipidemia   . Anxiety   . Bipolar disorder, unspecified   . Carotid  artery calcification   . DDD (degenerative disc disease), lumbosacral   . Spinal stenosis, lumbar   . Autoimmune thyroiditis   . Essential hypertension, benign   . TIA (transient ischemic attack)     August 2014  . GERD (gastroesophageal reflux disease)   . Neuropathic pain   . Addison's disease     On Prednisone  . History of pneumonia   . Achalasia   . Melanoma   . Frequent falls   . DM (diabetes mellitus) with complications   . 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 Hypothyroidism     Past Surgical History  Procedure Laterality Date  . Appendectomy    . Cholecystectomy    . Lump left breast      Benign  . Abdominal hysterectomy      Tubal pregnancy  . Inguinal hernia repair    . Stomach surgery    . Ectopic pregnancy surgery    . Back surgery      x 5;1984;1989;;1999;2000;2010  . Colonoscopy  01/25/2012    Procedure: COLONOSCOPY;  Surgeon: Rogene Houston, MD;  Location: [facility];  Service: Endoscopy;  Laterality: N/A;  130  . Esophagogastroduodenoscopy (egd) with esophageal dilation N/A 08/13/2012    Procedure: ESOPHAGOGASTRODUODENOSCOPY (EGD) WITH ESOPHAGEAL DILATION;  Surgeon: Rogene Houston, MD;  Location: [facility];  Service: Endoscopy;  Laterality: N/A;  315  . Anterior cervical decomp/discectomy fusion N/A 10/21/2012    Procedure: Cervical Six-Seven Anterior cervical decompression/diskectomy/fusion;  Surgeon: Kristeen Miss, MD;  Location: [facility];  Service: Neurosurgery;  Laterality: N/A;  Cervical Six-Seven Anterior cervical decompression/diskectomy/fusion  . Cataract extraction w/phaco Right 11/25/2012    Procedure: CATARACT EXTRACTION PHACO AND INTRAOCULAR LENS PLACEMENT (IOC);  Surgeon: Elta Guadeloupe T. Gershon Crane, MD;  Location: [facility];  Service: Ophthalmology;  Laterality: Right;  CDE:10.06  . Cataract extraction w/phaco Left 12/09/2012    Procedure: CATARACT EXTRACTION PHACO AND INTRAOCULAR LENS PLACEMENT (IOC);  Surgeon: Elta Guadeloupe T. Gershon Crane, MD;  Location: [facility];  Service: Ophthalmology;  Laterality: Left;  CDE:5.06  . Tonsillectomy    . Hiatal hernia repair    . Heller myotomy N/A 03/17/2013    Procedure: DIAGNOSTIC LAPAROSCOPY, LAPAROSCOPIC HELLER MYOTOMY, ENDOSCOPY, DOR FUNDOPLICATION;  Surgeon: Ralene Ok, MD;  Location: [facility];  Service: General;  Laterality: N/A;  . Breast surgery      Left breast  . Eye surgery    . Video bronchoscopy with endobronchial ultrasound N/A 07/24/2013    Procedure: VIDEO BRONCHOSCOPY WITH ENDOBRONCHIAL ULTRASOUND;  Surgeon: [name], MD;  Location: [facility];  Service: Thoracic;  Laterality: N/A;    has Hyperlipidemia; Depression; Chronic pain; COPD (chronic obstructive pulmonary disease); Tobacco use; Chronic back pain; Bipolar disorder; Carotid stenosis; Essential hypertension; Mole of skin; Fall; Hoarse voice quality; Spontaneous ecchymoses; TIA (transient ischemic attack); Dysarthria; Stricture and stenosis of  esophagus; Dysphagia; Peripheral edema; Urinary incontinence; Adrenal cortical insufficiency; Cervical spondylosis without myelopathy; Gait disturbance; Chalasia of lower esophageal sphincter; MDD (major depressive disorder), recurrent severe, without psychosis; PTSD (post-traumatic stress disorder); Unspecified episodic mood disorder; Memory problem; Lung mass; GERD (gastroesophageal reflux disease); Small cell carcinoma of lung; Elevated troponin; Anemia, unspecified; Obesity, morbid; HTN (hypertension); Hypothyroidism; Hypotension; Unspecified protein-calorie malnutrition; Nausea alone; Low BP; Fatigue; Decreased mobility; Physical deconditioning; Hypoalbuminemia; and Hypersensitivity reaction on her problem list.     is allergic to codeine; cyclobenzaprine; darvocet; abilify; ace inhibitors; adhesive; gabapentin; iodine; levofloxacin; prednisone; sulfa antibiotics; and robaxin.    Medication List  This list is accurate as of: 10/08/13  6:02 PM.  Always use your most recent med list.               albuterol 108 (90 BASE) MCG/ACT inhaler  Commonly known as:  PROVENTIL HFA;VENTOLIN HFA  Inhale 2 puffs into the lungs 3 (three) times daily as needed for wheezing or shortness of breath.     amitriptyline 100 MG tablet  Commonly known as:  ELAVIL  Take 1 tablet (100 mg total) by mouth at bedtime.     clonazePAM 1 MG tablet  Commonly known as:  KLONOPIN  Take 1 tablet (1 mg total) by mouth 4 (four) times daily.     dexlansoprazole 60 MG capsule  Commonly known as:  DEXILANT  Take 1 capsule (60 mg total) by mouth daily.     divalproex 500 MG 24 hr tablet  Commonly known as:  DEPAKOTE ER  Take 1 tablet (500 mg total) by mouth daily.     donepezil 5 MG tablet  Commonly known as:  ARICEPT  Take 1 tablet (5 mg total) by mouth at bedtime.     DULoxetine 60 MG capsule  Commonly known as:  CYMBALTA  Take 1 capsule (60 mg total) by mouth every morning.     emollient cream  Commonly  known as:  BIAFINE  Apply 1 application topically 2 (two) times daily. Apply to affected chest area after rad tx and bedtime, just not 4 hours prior to having rad txs, also use on the weekends     EPINEPHrine 0.3 mg/0.3 mL Soaj injection  Commonly known as:  EPIPEN  Inject 0.3 mLs (0.3 mg total) into the muscle once. PRN FOR SEVERE ALLERGIC REACTION     feeding supplement (ENSURE COMPLETE) Liqd  Take 237 mLs by mouth daily.     fentaNYL 50 MCG/HR  Commonly known as:  DURAGESIC - dosed mcg/hr  Place 1 patch (50 mcg total) onto the skin every 3 (three) days.     levothyroxine 25 MCG tablet  Commonly known as:  SYNTHROID, LEVOTHROID  Take 25 mcg by mouth daily before breakfast.     lidocaine 5 %  Commonly known as:  LIDODERM  Place 1 patch onto the skin daily. Remove & Discard patch within 12 hours or as directed by MD     lidocaine-prilocaine cream  Commonly known as:  EMLA  Apply 1 application topically as needed. Apply to port 1 hr before chemo     metoprolol tartrate 25 MG tablet  Commonly known as:  LOPRESSOR  Take 12.5 mg by mouth 2 (two) times daily with a meal.     ondansetron 4 MG tablet  Commonly known as:  ZOFRAN  Take 1 tablet (4 mg total) by mouth every 6 (six) hours as needed for refractory nausea / vomiting.     oxyCODONE-acetaminophen 5-325 MG per tablet  Commonly known as:  PERCOCET/ROXICET  Take 1 tablet by mouth every 6 (six) hours as needed for severe pain.     pantoprazole 40 MG tablet  Commonly known as:  PROTONIX  Take 1 tablet (40 mg total) by mouth daily.     pravastatin 40 MG tablet  Commonly known as:  PRAVACHOL  TAKE (1) TABLET BY MOUTH EVERY EVENING FOR HIGH CHOLESTEROL.     predniSONE 2.5 MG tablet  Commonly known as:  DELTASONE  Take 3 tablets (7.5 mg total) by mouth daily with breakfast.     prochlorperazine 10 MG tablet  Commonly known as:  COMPAZINE  Take 1 tablet (10 mg total) by mouth every 6 (six) hours as needed for nausea or  vomiting.     SPIRIVA RESPIMAT 2.5 MCG/ACT Aers  Generic drug:  Tiotropium Bromide Monohydrate  Inhale 2 puffs into the lungs daily.         PHYSICAL EXAMINATION  Vitals: BP 137/85, HR 92, temp 98.0  Physical Exam  Nursing note and vitals reviewed. Constitutional: She is oriented to person, place, and time and well-developed, well-nourished, and in no distress.  HENT:  Mouth/Throat: Oropharynx is clear and moist.  Patient noted with moderate generalized facial edema.  Eyes: Conjunctivae and EOM are normal. Pupils are equal, round, and reactive to light. Right eye exhibits no discharge. Left eye exhibits no discharge. No scleral icterus.  Neck: Normal range of motion. Neck supple. No JVD present. No tracheal deviation present. No thyromegaly present.  Cardiovascular: Normal rate, regular rhythm, normal heart sounds and intact distal pulses.  Exam reveals no friction rub.   No murmur heard. Pulmonary/Chest: Effort normal and breath sounds normal. No respiratory distress. She has no wheezes.  Abdominal: Soft. Bowel sounds are normal. She exhibits no distension. There is no tenderness. There is no rebound.  Musculoskeletal: Normal range of motion. She exhibits edema and tenderness.  Lymphadenopathy:    She has no cervical adenopathy.  Neurological: She is alert and oriented to person, place, and time. Gait normal.  Skin: Skin is warm and dry. No rash noted. No erythema.  Psychiatric: Affect and judgment normal.    LABORATORY DATA:. CBC  Lab Results  Component Value Date   WBC 8.0 10/07/2013   RBC 3.37* 10/07/2013   HGB 10.1* 10/07/2013   HCT 31.5* 10/07/2013   PLT 314 10/07/2013   MCV 93.3 10/07/2013   MCH 30.0 10/07/2013   MCHC 32.1 10/07/2013   RDW 19.0* 10/07/2013   LYMPHSABS 0.3* 10/07/2013   MONOABS 0.5 10/07/2013   EOSABS 0.0 10/07/2013   BASOSABS 0.0 10/07/2013     CMET  Lab Results  Component Value Date   NA 138 10/07/2013   K 5.0 10/07/2013   CL 103 09/16/2013   CO2  28 10/07/2013   GLUCOSE 102 10/07/2013   BUN 15.4 10/07/2013   CREATININE 0.8 10/07/2013   CALCIUM 9.1 10/07/2013   PROT 6.3* 10/07/2013   ALBUMIN 3.1* 10/07/2013   AST 18 10/07/2013   ALT 17 10/07/2013   ALKPHOS 58 10/07/2013   BILITOT 0.41 10/07/2013   GFRNONAA >90 09/16/2013   GFRAA >90 09/16/2013    ASSESSMENT/PLAN:    Small cell carcinoma of lung  Assessment & Plan Patient presented today to receive cycle 3, day 3 of the etoposide only portion of her cisplatin/etoposide chemotherapy regimen.  Patient will return tomorrow 10/09/2013 for her Neulasta injection for growth factor support.  Cycle 4 of the same chemotherapy regimen we do on 10/27/2013.   She also continues radiation therapy as previously directed.    Also, reminded patient that we will need to reschedule patient's restaging chest CT with contrast-most likely within this next day or so.   Hypersensitivity reaction  Assessment & Plan Patient presented today to receive cycle 3, day 3 of the etoposide only portion of her cisplatin/etoposide chemotherapy regimen.  She also continues radiation therapy as previously directed.  Patient states that she woke up with mild to moderate facial swelling.  She denies any swallowing or airway difficulty whatsoever.  Patient states she takes prednisone 7.5 mg on a  daily basis for her a chronic adrenal issue.  However, patient does have prednisone listed on her allergy list.  Patient confirmed that high-dose steroids have typically caused facial edema in the past.  With help dexamethasone 10 mg IV today as a premedication with her to the site; and gave patient Compazine 10 mg by mouth instead after discussing/clearing with pharmacy today.    Patient appears to have tolerated the etoposide fairly well today with no nausea or vomiting.  Patient's facial edema did gradually resolve after patient received both Benadryl 25 mg and Pepcid 20 mg IV.     May want to consider holding day 2 and day 3 IV  dexamethasone premedication with future cycles of chemotherapy due to this reaction.  Also, reminded patient that we will need to reschedule patient's restaging chest CT with contrast-most likely within this next day or so.    Patient stated understanding of all instructions; and was in agreement with this plan of care. The patient knows to call the clinic with any problems, questions or concerns.   Review/collaboration with Dr. Julien Nordmann regarding all aspects of patient's visit today.   Total time spent with patient was 25 minutes;  with greater than 80 percent of that time spent in face to face counseling regarding her symptoms, and coordination of care and follow up.  Disclaimer: This note was dictated with voice recognition software. Similar sounding words can inadvertently be transcribed and may not be corrected upon review.   Drue Second, NP 10/08/2013

## 2013-10-08 NOTE — Assessment & Plan Note (Signed)
Patient continues to complain of chronic fatigue symptoms.  The patient was encouraged to remain as active as possible.  May also want to consider a physical therapy referral so patient can perhaps regain some strength.

## 2013-10-08 NOTE — Assessment & Plan Note (Signed)
Patient's primary care provider has increased patient's Fentanyl patch from 25 mcg up to 50 mcg; but patient continues to request Percocet for occasional breakthrough pain.  Patient's primary care provider notes state that they prefer to hold on any oral pain medication; and to titrate up on the fentanyl patch instead.  Patient was encouraged to follow up with her primary care provider in regards to pain medication management.  Also, patient was encouraged to take Claritin prior to receiving her Neulasta injection and for a few days following the Neulasta injection.

## 2013-10-08 NOTE — Patient Instructions (Signed)
[facility] Discharge Instructions for Patients Receiving Chemotherapy  Today you received the following chemotherapy agents VP 16 (Etoposide) To help prevent nausea and vomiting after your treatment, we encourage you to take your nausea medication as prescribed.  If you develop nausea and vomiting that is not controlled by your nausea medication, call the clinic.   BELOW ARE SYMPTOMS THAT SHOULD BE REPORTED IMMEDIATELY:  *FEVER GREATER THAN 100.5 F  *CHILLS WITH OR WITHOUT FEVER  NAUSEA AND VOMITING THAT IS NOT CONTROLLED WITH YOUR NAUSEA MEDICATION  *UNUSUAL SHORTNESS OF BREATH  *UNUSUAL BRUISING OR BLEEDING  TENDERNESS IN MOUTH AND THROAT WITH OR WITHOUT PRESENCE OF ULCERS  *URINARY PROBLEMS  *BOWEL PROBLEMS  UNUSUAL RASH Items with * indicate a potential emergency and should be followed up as soon as possible.  Feel free to call the clinic you have any questions or concerns. The clinic phone number is [phone].

## 2013-10-08 NOTE — Assessment & Plan Note (Deleted)
Patient presented today to receive cycle 3, day 3 of the etoposide only portion of her cisplatin/etoposide chemotherapy regimen.  She also continues radiation therapy as previously directed.  Patient states that she woke up with mild to moderate facial swelling.  She denies any swallowing or airway difficulty whatsoever.  Patient states she takes prednisone 7.5 mg on a daily basis for her a chronic adrenal issue.  However, patient does have prednisone listed on her allergy list.  Patient confirmed that high-dose steroids have typically caused facial edema in the past.  With help dexamethasone 10 mg IV today as a premedication with her to the site; and gave patient Compazine 10 mg by mouth instead after discussing/clearing with pharmacy today.  May want to consider holding day 2 and day 3 IV dexamethasone premedication with future cycles of chemotherapy due to this reaction.  Also, reminded patient that we will need to reschedule patient's restaging chest CT with contrast-most likely within this next day or so.

## 2013-10-08 NOTE — Assessment & Plan Note (Signed)
Patient's albumin is low today.  Patient was encouraged to push protein in her diet.

## 2013-10-08 NOTE — Assessment & Plan Note (Addendum)
Patient presented today to receive cycle 3, day 3 of the etoposide only portion of her cisplatin/etoposide chemotherapy regimen.  Patient will return tomorrow 10/09/2013 for her Neulasta injection for growth factor support.  Cycle 4 of the same chemotherapy regimen we do on 10/27/2013.   She also continues radiation therapy as previously directed.    Also, reminded patient that we will need to reschedule patient's restaging chest CT with contrast-most likely within this next day or so.

## 2013-10-08 NOTE — Assessment & Plan Note (Signed)
Patient continues to smoke on a daily basis; but states she has decreased her smoking down to approximately one quarter of a pack per day.

## 2013-10-08 NOTE — Progress Notes (Signed)
Patient presents to clinic with swollen eyelids with some redness around the eyes as well. Patient denies SOB, Chest pain or any other symptoms. Patient states this happens every time she gets this chemotherapy. Selena Lesser, NP notified. Order given and carried out for 25 mg Benadryl IV, Pepcid 20 mg IV, and 10 mg Compazine PO. Patient verbalized and agreed to above plan. Dexamethasone held per Selena Lesser, NP.  Patient complains of "heartburn", Selena Lesser, NP notified order given and carried out for Maalox PO.  1550 Patient swelling of the eyelids has decreased, the patient states her eyes feel less swollen as well. Patient also states she no longer has "heartburn".

## 2013-10-08 NOTE — Assessment & Plan Note (Addendum)
Patient presented today to receive cycle 3, day 3 of the etoposide only portion of her cisplatin/etoposide chemotherapy regimen.  She also continues radiation therapy as previously directed.  Patient states that she woke up with mild to moderate facial swelling.  She denies any swallowing or airway difficulty whatsoever.  Patient states she takes prednisone 7.5 mg on a daily basis for her a chronic adrenal issue.  However, patient does have prednisone listed on her allergy list.  Patient confirmed that high-dose steroids have typically caused facial edema in the past.  With help dexamethasone 10 mg IV today as a premedication with her to the site; and gave patient Compazine 10 mg by mouth instead after discussing/clearing with pharmacy today.    Patient appears to have tolerated the etoposide fairly well today with no nausea or vomiting.  Patient's facial edema did gradually resolve after patient received both Benadryl 25 mg and Pepcid 20 mg IV.     May want to consider holding day 2 and day 3 IV dexamethasone premedication with future cycles of chemotherapy due to this reaction.  Also, reminded patient that we will need to reschedule patient's restaging chest CT with contrast-most likely within this next day or so.

## 2013-10-08 NOTE — Assessment & Plan Note (Signed)
Patient does appear deconditioned; most likely secondary to her chemotherapy.  Will order a physical therapy referral for strength training.  Patient does have some transportation issues-so will try to obtain physical therapy home consult.

## 2013-10-09 ENCOUNTER — Telehealth: Payer: Self-pay | Admitting: Physician Assistant

## 2013-10-09 ENCOUNTER — Other Ambulatory Visit: Payer: Self-pay | Admitting: *Deleted

## 2013-10-09 ENCOUNTER — Ambulatory Visit: Payer: Medicare Other

## 2013-10-09 ENCOUNTER — Ambulatory Visit
Admission: RE | Admit: 2013-10-09 | Discharge: 2013-10-09 | Disposition: A | Discharge status: Home / Self Care | Payer: Medicare Other | Source: Ambulatory Visit | Attending: Radiation Oncology | Admitting: Radiation Oncology

## 2013-10-09 ENCOUNTER — Telehealth: Payer: Self-pay | Admitting: *Deleted

## 2013-10-09 ENCOUNTER — Ambulatory Visit (HOSPITAL_BASED_OUTPATIENT_CLINIC_OR_DEPARTMENT_OTHER): Payer: Medicare Other

## 2013-10-09 VITALS — BP 133/84 | HR 91 | Temp 97.9°F

## 2013-10-09 DIAGNOSIS — Z5189 Encounter for other specified aftercare: Secondary | ICD-10-CM

## 2013-10-09 DIAGNOSIS — C3490 Malignant neoplasm of unspecified part of unspecified bronchus or lung: Secondary | ICD-10-CM

## 2013-10-09 DIAGNOSIS — Z510 Encounter for antineoplastic radiation therapy: Secondary | ICD-10-CM | POA: Diagnosis not present

## 2013-10-09 DIAGNOSIS — C3411 Malignant neoplasm of upper lobe, right bronchus or lung: Secondary | ICD-10-CM

## 2013-10-09 DIAGNOSIS — C3491 Malignant neoplasm of unspecified part of right bronchus or lung: Secondary | ICD-10-CM

## 2013-10-09 MED ORDER — PEGFILGRASTIM INJECTION 6 MG/0.6ML
6.0000 mg | Freq: Once | SUBCUTANEOUS | Status: AC
  Administered 2013-10-09: 6 mg via SUBCUTANEOUS
  Filled 2013-10-09: qty 0.6

## 2013-10-09 MED ORDER — OXYCODONE-ACETAMINOPHEN 5-325 MG PO TABS: 1.0000 | ORAL_TABLET | Freq: Four times a day (QID) | ORAL | Status: DC | PRN

## 2013-10-09 NOTE — Telephone Encounter (Signed)
Per staff message and POF I have scheduled appts. Advised scheduler of appts. JMW  

## 2013-10-09 NOTE — Telephone Encounter (Signed)
Lft msg for pt confirming CT Chest w/[facility] r/s also spk w/Janice who is going to give pt an updated sch w/CT Chest and PT. Will mail out updated copy once I recd new sch with chemo added per 10/08 POF..... KJ

## 2013-10-12 ENCOUNTER — Ambulatory Visit: Payer: Medicare Other

## 2013-10-12 ENCOUNTER — Ambulatory Visit (HOSPITAL_COMMUNITY): Admission: RE | Admit: 2013-10-12 | Payer: Medicare Other | Source: Ambulatory Visit

## 2013-10-13 ENCOUNTER — Telehealth: Payer: Self-pay | Admitting: *Deleted

## 2013-10-13 ENCOUNTER — Ambulatory Visit
Admission: RE | Admit: 2013-10-13 | Discharge: 2013-10-13 | Disposition: A | Discharge status: Home / Self Care | Payer: Medicare Other | Source: Ambulatory Visit | Attending: Radiation Oncology | Admitting: Radiation Oncology

## 2013-10-13 ENCOUNTER — Ambulatory Visit (INDEPENDENT_AMBULATORY_CARE_PROVIDER_SITE_OTHER): Payer: Medicare Other | Admitting: Family Medicine

## 2013-10-13 ENCOUNTER — Ambulatory Visit: Payer: Medicare Other | Admitting: Family Medicine

## 2013-10-13 ENCOUNTER — Encounter: Payer: Self-pay | Admitting: Family Medicine

## 2013-10-13 ENCOUNTER — Ambulatory Visit: Payer: Medicare Other

## 2013-10-13 VITALS — BP 130/78 | HR 72 | Temp 98.5°F | Resp 16 | Ht 66.0 in | Wt 195.0 lb

## 2013-10-13 VITALS — BP 97/70 | HR 103 | Temp 98.7°F | Wt 196.2 lb

## 2013-10-13 DIAGNOSIS — F332 Major depressive disorder, recurrent severe without psychotic features: Secondary | ICD-10-CM

## 2013-10-13 DIAGNOSIS — I6529 Occlusion and stenosis of unspecified carotid artery: Secondary | ICD-10-CM

## 2013-10-13 DIAGNOSIS — C3490 Malignant neoplasm of unspecified part of unspecified bronchus or lung: Secondary | ICD-10-CM

## 2013-10-13 DIAGNOSIS — I10 Essential (primary) hypertension: Secondary | ICD-10-CM

## 2013-10-13 DIAGNOSIS — Z510 Encounter for antineoplastic radiation therapy: Secondary | ICD-10-CM | POA: Diagnosis not present

## 2013-10-13 DIAGNOSIS — K219 Gastro-esophageal reflux disease without esophagitis: Secondary | ICD-10-CM

## 2013-10-13 NOTE — Assessment & Plan Note (Signed)
Blood pressure well controlled

## 2013-10-13 NOTE — Assessment & Plan Note (Signed)
Currently stable , she is being provided services through [facility] which is helping a great deal Continue current meds

## 2013-10-13 NOTE — Patient Instructions (Signed)
Continue dexilant No change to pain meds F/U 4 months

## 2013-10-13 NOTE — Assessment & Plan Note (Signed)
I have given her Dexilant 60mg  samples from the office, We will check with pharmacy to see what the issues. She's been tried on Protonix as well as omeprazole Zantac over-the-counter

## 2013-10-13 NOTE — Progress Notes (Signed)
Weekly assessment of radiation to right upper lung lobe.Increased fatigue today.BP low 97/70 HR 103.Developed facial edema last week after chem, states chemo is on hold now.Generalized chronic pain.Missed ct scan appointment on 10/12/13.Does she need this now?

## 2013-10-13 NOTE — Progress Notes (Signed)
Weekly Management Note Current Dose:46 Gy  Projected Dose:60 Gy   Narrative:  The patient presents for routine under treatment assessment.  CBCT/MVCT images/Port film x-rays were reviewed.  The chart was checked. Doing well. No swallowing difficulties. "I'm just tired and want to go home". Facial edema has resolved. Would like to reschedule CT scan for after treatment (she missed it yesterday)  Physical Findings:  Unchanged. Alert and oriented.   Vitals:  Filed Vitals:   10/13/13 1246  BP: 97/70  Pulse: 103  Temp: 98.7 F (37.1 C)   Weight:  Wt Readings from Last 3 Encounters:  10/13/13 196 lb 3.2 oz (88.996 kg)  10/13/13 195 lb (88.451 kg)  10/06/13 202 lb 1.6 oz (91.672 kg)   Lab Results  Component Value Date   WBC 8.0 10/07/2013   HGB 10.1* 10/07/2013   HCT 31.5* 10/07/2013   MCV 93.3 10/07/2013   PLT 314 10/07/2013   Lab Results  Component Value Date   CREATININE 0.8 10/07/2013   BUN 15.4 10/07/2013   NA 138 10/07/2013   K 5.0 10/07/2013   CL 103 09/16/2013   CO2 28 10/07/2013     Impression:  The patient is tolerating radiation.  Plan:  Continue treatment as planned. Continue RT. Cancel Ct scan for now. Will need to be rescheduled after radiation and chemo are complete (delayed due to patient missing several weeks of treatment off and on).

## 2013-10-13 NOTE — Progress Notes (Signed)
Patient ID: Tonya Sullivan, female   DOB: 1955-08-08, 58 y.o.   MRN: [id number]   Subjective:    Patient ID: Tonya Sullivan, female    DOB: 11/23/1955, 58 y.o.   MRN: [id number]  Patient presents for 2 month F/U and Discuss Meds  patient here to followup medications. 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 The staging of limited therefore she was due for repeat CT scan however she missed the appointment for this and has been rescheduled. She denies any shortness of breath or chest pain but does complain of her chronic back pain and pain into her hips. She's on fit no which I increased to 50 mcg recently however her oncologist also gave her Percocet for breakthrough pain because of the bone pain. She continues on her anxiety and depression medicines as prescribed by her psychiatrist. After review of all her medication she was to continue all of her medicines as they're currently prescribed. She states that she is getting help from her son and his girlfriend and she still has her personal aide in the home.  Regarding her medications she has had some difficulty getting her to dexilant    Review Of Systems:  GEN- denies fatigue, fever, weight loss,weakness, recent illness HEENT- denies eye drainage, change in vision, nasal discharge, CVS- denies chest pain, palpitations RESP- denies SOB, cough, wheeze ABD- denies N/V, change in stools, abd pain GU- denies dysuria, hematuria, dribbling, incontinence MSK- + joint pain, muscle aches, injury Neuro- denies headache, dizziness, syncope, seizure activity       Objective:    BP 130/78  Pulse 72  Temp(Src) 98.5 F (36.9 C) (Oral)  Resp 16  Ht 5\' 6"  (1.676 m)  Wt 195 lb (88.451 kg)  BMI 31.49 kg/m2 GEN- NAD, alert and oriented x3 HEENT- PERRL, EOMI, non injected sclera, pink conjunctiva, MMM, oropharynx clear Neck- Supple, no LAD CVS- RRR, no murmur RESP-CTAB, good air movement Psych- depressed, flat affect, not  anxious appearing, normal speech, no SI, no hallucinations EXT- No edema Pulses- Radial, DP- 2+        Assessment & Plan:      Problem List Items Addressed This Visit   None      Note: This dictation was prepared with Dragon dictation along with smaller phrase technology. Any transcriptional errors that result from this process are unintentional.

## 2013-10-13 NOTE — Telephone Encounter (Signed)
Patient seen in office as F/U.   Reports that she has not been able to get Dexilant.   Call placed to [facility].   Was advised that patient insurance requires PA for Dexilant, and PA request had been faxed to Bradd Canary, NP.   Advised to send all refill requests and PA requested to [facility].  PA submitted.

## 2013-10-13 NOTE — Assessment & Plan Note (Addendum)
Followed by oncology. I will continue prescribing fentanyl patches she is getting to break- through Percocet from her oncologist She will have a repeat CT scan to assess for staging. This point she is having difficulty with some of the chemotherapy drugs she is having some reactions to them including facial swelling seems there been some changes made in her infusions. Overall she seems to be stable at this point we'll continue to monitor her.

## 2013-10-14 ENCOUNTER — Other Ambulatory Visit: Payer: Self-pay

## 2013-10-14 ENCOUNTER — Telehealth: Payer: Self-pay | Admitting: *Deleted

## 2013-10-14 ENCOUNTER — Encounter: Payer: Self-pay | Admitting: Radiation Oncology

## 2013-10-14 ENCOUNTER — Ambulatory Visit
Admission: RE | Admit: 2013-10-14 | Discharge: 2013-10-14 | Disposition: A | Discharge status: Home / Self Care | Payer: Medicare Other | Source: Ambulatory Visit | Attending: Radiation Oncology | Admitting: Radiation Oncology

## 2013-10-14 ENCOUNTER — Ambulatory Visit: Payer: Medicare Other | Attending: Internal Medicine | Admitting: Physical Therapy

## 2013-10-14 ENCOUNTER — Ambulatory Visit (HOSPITAL_BASED_OUTPATIENT_CLINIC_OR_DEPARTMENT_OTHER): Payer: Medicare Other | Admitting: Nurse Practitioner

## 2013-10-14 VITALS — BP 81/55 | HR 96 | Ht 66.0 in | Wt 195.9 lb

## 2013-10-14 DIAGNOSIS — R269 Unspecified abnormalities of gait and mobility: Secondary | ICD-10-CM | POA: Diagnosis not present

## 2013-10-14 DIAGNOSIS — E875 Hyperkalemia: Secondary | ICD-10-CM

## 2013-10-14 DIAGNOSIS — D696 Thrombocytopenia, unspecified: Secondary | ICD-10-CM

## 2013-10-14 DIAGNOSIS — C3491 Malignant neoplasm of unspecified part of right bronchus or lung: Secondary | ICD-10-CM

## 2013-10-14 DIAGNOSIS — C3490 Malignant neoplasm of unspecified part of unspecified bronchus or lung: Secondary | ICD-10-CM

## 2013-10-14 DIAGNOSIS — I951 Orthostatic hypotension: Secondary | ICD-10-CM

## 2013-10-14 DIAGNOSIS — Z5189 Encounter for other specified aftercare: Secondary | ICD-10-CM | POA: Diagnosis present

## 2013-10-14 DIAGNOSIS — E860 Dehydration: Secondary | ICD-10-CM

## 2013-10-14 DIAGNOSIS — M545 Low back pain: Secondary | ICD-10-CM | POA: Insufficient documentation

## 2013-10-14 DIAGNOSIS — Z510 Encounter for antineoplastic radiation therapy: Secondary | ICD-10-CM | POA: Diagnosis not present

## 2013-10-14 DIAGNOSIS — E8809 Other disorders of plasma-protein metabolism, not elsewhere classified: Secondary | ICD-10-CM

## 2013-10-14 DIAGNOSIS — G8929 Other chronic pain: Secondary | ICD-10-CM

## 2013-10-14 DIAGNOSIS — R530 Neoplastic (malignant) related fatigue: Secondary | ICD-10-CM

## 2013-10-14 LAB — CBC WITH DIFFERENTIAL/PLATELET
BASO%: 1.3 % (ref 0.0–2.0)
Basophils Absolute: 0 10*3/uL (ref 0.0–0.1)
EOS%: 2.1 % (ref 0.0–7.0)
Eosinophils Absolute: 0.1 10*3/uL (ref 0.0–0.5)
HCT: 31.6 % — ABNORMAL LOW (ref 34.8–46.6)
HEMOGLOBIN: 10.3 g/dL — AB (ref 11.6–15.9)
LYMPH#: 0.4 10*3/uL — AB (ref 0.9–3.3)
LYMPH%: 15.4 % (ref 14.0–49.7)
MCH: 30.5 pg (ref 25.1–34.0)
MCHC: 32.6 g/dL (ref 31.5–36.0)
MCV: 93.5 fL (ref 79.5–101.0)
MONO#: 0.1 10*3/uL (ref 0.1–0.9)
MONO%: 3.3 % (ref 0.0–14.0)
NEUT#: 1.9 10*3/uL (ref 1.5–6.5)
NEUT%: 77.9 % — ABNORMAL HIGH (ref 38.4–76.8)
NRBC: 0 % (ref 0–0)
Platelets: 83 10*3/uL — ABNORMAL LOW (ref 145–400)
RBC: 3.38 10*6/uL — AB (ref 3.70–5.45)
RDW: 17.9 % — AB (ref 11.2–14.5)
WBC: 2.4 10*3/uL — ABNORMAL LOW (ref 3.9–10.3)

## 2013-10-14 LAB — COMPREHENSIVE METABOLIC PANEL (CC13)
ALT: 8 U/L (ref 0–55)
ANION GAP: 9 meq/L (ref 3–11)
AST: 9 U/L (ref 5–34)
Albumin: 3.3 g/dL — ABNORMAL LOW (ref 3.5–5.0)
Alkaline Phosphatase: 97 U/L (ref 40–150)
BUN: 17.2 mg/dL (ref 7.0–26.0)
CALCIUM: 9.2 mg/dL (ref 8.4–10.4)
CO2: 24 meq/L (ref 22–29)
CREATININE: 0.9 mg/dL (ref 0.6–1.1)
Chloride: 103 mEq/L (ref 98–109)
GLUCOSE: 110 mg/dL (ref 70–140)
Potassium: 5.5 mEq/L — ABNORMAL HIGH (ref 3.5–5.1)
Sodium: 136 mEq/L (ref 136–145)
Total Protein: 6.7 g/dL (ref 6.4–8.3)

## 2013-10-14 LAB — MAGNESIUM (CC13): Magnesium: 2.2 mg/dl (ref 1.5–2.5)

## 2013-10-14 NOTE — Progress Notes (Signed)
Patient to nursing for assessment of increased weakness and dizziness.Orthostatic vitals not bad.Patient has 6 radiation treatments remaining.chemotherapy on hold this week.Facial edema has resolved.Chronic back pain "7".

## 2013-10-14 NOTE — Telephone Encounter (Signed)
Called patient and let her know that Dr. Julien Nordmann wanted to recheck her potassium level. Scheduled patient for lab tomorrow at 2:15pm. Spoke with patient and let her know to be here tomorrow at 2:15 for lab prior to radiation. Pt agreeable to this.

## 2013-10-15 ENCOUNTER — Encounter: Payer: Self-pay | Admitting: Nurse Practitioner

## 2013-10-15 ENCOUNTER — Other Ambulatory Visit: Payer: Self-pay

## 2013-10-15 ENCOUNTER — Emergency Department (HOSPITAL_COMMUNITY)
Admission: EM | Admit: 2013-10-15 | Discharge: 2013-10-15 | Disposition: A | Discharge status: Home / Self Care | Payer: Medicare Other | Attending: Emergency Medicine | Admitting: Emergency Medicine

## 2013-10-15 ENCOUNTER — Encounter (HOSPITAL_COMMUNITY): Payer: Self-pay | Admitting: Emergency Medicine

## 2013-10-15 ENCOUNTER — Telehealth: Payer: Self-pay | Admitting: Nurse Practitioner

## 2013-10-15 ENCOUNTER — Ambulatory Visit: Payer: Medicare Other

## 2013-10-15 ENCOUNTER — Other Ambulatory Visit: Payer: Self-pay | Admitting: Nurse Practitioner

## 2013-10-15 ENCOUNTER — Ambulatory Visit: Payer: Self-pay

## 2013-10-15 DIAGNOSIS — J449 Chronic obstructive pulmonary disease, unspecified: Secondary | ICD-10-CM | POA: Insufficient documentation

## 2013-10-15 DIAGNOSIS — E039 Hypothyroidism, unspecified: Secondary | ICD-10-CM | POA: Diagnosis not present

## 2013-10-15 DIAGNOSIS — Z85820 Personal history of malignant melanoma of skin: Secondary | ICD-10-CM | POA: Diagnosis not present

## 2013-10-15 DIAGNOSIS — F419 Anxiety disorder, unspecified: Secondary | ICD-10-CM | POA: Insufficient documentation

## 2013-10-15 DIAGNOSIS — I10 Essential (primary) hypertension: Secondary | ICD-10-CM | POA: Diagnosis not present

## 2013-10-15 DIAGNOSIS — Z720 Tobacco use: Secondary | ICD-10-CM | POA: Diagnosis not present

## 2013-10-15 DIAGNOSIS — Z9181 History of falling: Secondary | ICD-10-CM | POA: Insufficient documentation

## 2013-10-15 DIAGNOSIS — F319 Bipolar disorder, unspecified: Secondary | ICD-10-CM | POA: Diagnosis not present

## 2013-10-15 DIAGNOSIS — Z79899 Other long term (current) drug therapy: Secondary | ICD-10-CM | POA: Insufficient documentation

## 2013-10-15 DIAGNOSIS — Z8701 Personal history of pneumonia (recurrent): Secondary | ICD-10-CM | POA: Diagnosis not present

## 2013-10-15 DIAGNOSIS — E860 Dehydration: Secondary | ICD-10-CM | POA: Diagnosis not present

## 2013-10-15 DIAGNOSIS — Z8673 Personal history of transient ischemic attack (TIA), and cerebral infarction without residual deficits: Secondary | ICD-10-CM | POA: Insufficient documentation

## 2013-10-15 DIAGNOSIS — I951 Orthostatic hypotension: Secondary | ICD-10-CM | POA: Insufficient documentation

## 2013-10-15 DIAGNOSIS — Z8739 Personal history of other diseases of the musculoskeletal system and connective tissue: Secondary | ICD-10-CM | POA: Insufficient documentation

## 2013-10-15 DIAGNOSIS — R531 Weakness: Secondary | ICD-10-CM | POA: Diagnosis present

## 2013-10-15 DIAGNOSIS — E785 Hyperlipidemia, unspecified: Secondary | ICD-10-CM | POA: Diagnosis not present

## 2013-10-15 DIAGNOSIS — C3490 Malignant neoplasm of unspecified part of unspecified bronchus or lung: Secondary | ICD-10-CM

## 2013-10-15 DIAGNOSIS — Z85118 Personal history of other malignant neoplasm of bronchus and lung: Secondary | ICD-10-CM | POA: Insufficient documentation

## 2013-10-15 DIAGNOSIS — Z7952 Long term (current) use of systemic steroids: Secondary | ICD-10-CM | POA: Diagnosis not present

## 2013-10-15 DIAGNOSIS — E119 Type 2 diabetes mellitus without complications: Secondary | ICD-10-CM | POA: Insufficient documentation

## 2013-10-15 DIAGNOSIS — K219 Gastro-esophageal reflux disease without esophagitis: Secondary | ICD-10-CM | POA: Diagnosis not present

## 2013-10-15 DIAGNOSIS — D696 Thrombocytopenia, unspecified: Secondary | ICD-10-CM | POA: Insufficient documentation

## 2013-10-15 DIAGNOSIS — E875 Hyperkalemia: Secondary | ICD-10-CM | POA: Insufficient documentation

## 2013-10-15 LAB — CBC WITH DIFFERENTIAL/PLATELET
BASOS ABS: 0 10*3/uL (ref 0.0–0.1)
BASOS PCT: 1 % (ref 0–1)
EOS ABS: 0.1 10*3/uL (ref 0.0–0.7)
Eosinophils Relative: 5 % (ref 0–5)
HCT: 29.5 % — ABNORMAL LOW (ref 36.0–46.0)
HEMOGLOBIN: 9.8 g/dL — AB (ref 12.0–15.0)
LYMPHS PCT: 49 % — AB (ref 12–46)
Lymphs Abs: 0.4 10*3/uL — ABNORMAL LOW (ref 0.7–4.0)
MCH: 30.9 pg (ref 26.0–34.0)
MCHC: 33.2 g/dL (ref 30.0–36.0)
MCV: 93.1 fL (ref 78.0–100.0)
MONO ABS: 0.1 10*3/uL (ref 0.1–1.0)
Monocytes Relative: 8 % (ref 3–12)
NEUTROS PCT: 37 % — AB (ref 43–77)
Neutro Abs: 0.4 10*3/uL — ABNORMAL LOW (ref 1.7–7.7)
PLATELETS: 67 10*3/uL — AB (ref 150–400)
RBC: 3.17 MIL/uL — ABNORMAL LOW (ref 3.87–5.11)
RDW: 17.3 % — ABNORMAL HIGH (ref 11.5–15.5)
WBC: 1 10*3/uL — CL (ref 4.0–10.5)

## 2013-10-15 LAB — BASIC METABOLIC PANEL
ANION GAP: 12 (ref 5–15)
BUN: 17 mg/dL (ref 6–23)
CHLORIDE: 98 meq/L (ref 96–112)
CO2: 28 mEq/L (ref 19–32)
Calcium: 9.2 mg/dL (ref 8.4–10.5)
Creatinine, Ser: 0.81 mg/dL (ref 0.50–1.10)
GFR calc Af Amer: >90 mL/min (ref >90)
GFR, EST NON AFRICAN AMERICAN: 79 mL/min — AB (ref >90)
Glucose, Bld: 91 mg/dL (ref 70–99)
POTASSIUM: 5.1 meq/L (ref 3.7–5.3)
Sodium: 138 mEq/L (ref 137–147)

## 2013-10-15 MED ORDER — ONDANSETRON HCL 4 MG/2ML IJ SOLN
4.0000 mg | Freq: Once | INTRAMUSCULAR | Status: AC
  Administered 2013-10-15: 4 mg via INTRAVENOUS
  Filled 2013-10-15: qty 2

## 2013-10-15 MED ORDER — SODIUM CHLORIDE 0.9 % IV BOLUS (SEPSIS)
1000.0000 mL | Freq: Once | INTRAVENOUS | Status: AC
  Administered 2013-10-15: 1000 mL via INTRAVENOUS

## 2013-10-15 MED ORDER — HEPARIN SOD (PORK) LOCK FLUSH 100 UNIT/ML IV SOLN
500.0000 [IU] | Freq: Once | INTRAVENOUS | Status: AC
  Administered 2013-10-15: 500 [IU] via INTRAVENOUS
  Filled 2013-10-15: qty 5

## 2013-10-15 NOTE — ED Provider Notes (Signed)
CSN: [id number]     Arrival date & time 10/15/13  1550 History  This chart was scribe for Maudry Diego, MD by Judithann Sauger, ED Scribe. The patient was seen in room APA02/APA02 and the patient's care was started at 4:40 PM.    No chief complaint on file.  Patient is a 58 y.o. female presenting with weakness. The history is provided by the patient. No language interpreter was used.  Weakness This is a new problem. The current episode started more than 2 days ago. The problem has not changed since onset.Pertinent negatives include no chest pain, no abdominal pain and no headaches. Nothing aggravates the symptoms. Nothing relieves the symptoms. She has tried nothing for the symptoms.   HPI Comments: Tonya Sullivan is a 58 y.o. female who presents to the Emergency Department complaining of a gradual onset of generalized weakness since 5 days ago. She complains of dehydration and "feeling off balance" since yesterday. She states that she feels as though"she knows she has lost a lot of blood". Yesterday, she came to [facility] for a lab test and results showed that her hemoglobin count was too high and her blood pressure was too low. She was advised to come to the Emergency room for fluids however she did not have adequate transportation to bring her on time. She reports that she has cancer and her last cancer treatment was on October 10, 2013. No alleviating factors noted.   Past Medical History  Diagnosis Date  . COPD (chronic obstructive pulmonary disease)   . Depression   . Chronic back pain     DDD, disc bulge, radiculopathy, spinal stenosis  . Hyperlipidemia   . Anxiety   . Bipolar disorder, unspecified   . Carotid artery calcification   . DDD (degenerative disc disease), lumbosacral   . Spinal stenosis, lumbar   . Autoimmune thyroiditis   . Essential hypertension, benign   . TIA (transient ischemic attack)     August 2014  . GERD (gastroesophageal reflux disease)   . Neuropathic  pain   . Addison's disease     On Prednisone  . History of pneumonia   . Achalasia   . Frequent falls   . DM (diabetes mellitus) with complications   . Hypothyroidism   . Melanoma   . Small cell carcinoma of lung 08/06/2013   Past Surgical History  Procedure Laterality Date  . Appendectomy    . Cholecystectomy    . Lump left breast      Benign  . Abdominal hysterectomy      Tubal pregnancy  . Inguinal hernia repair    . Stomach surgery    . Ectopic pregnancy surgery    . Back surgery      x 5;1984;1989;;1999;2000;2010  . Colonoscopy  01/25/2012    Procedure: COLONOSCOPY;  Surgeon: Rogene Houston, MD;  Location: [facility];  Service: Endoscopy;  Laterality: N/A;  130  . Esophagogastroduodenoscopy (egd) with esophageal dilation N/A 08/13/2012    Procedure: ESOPHAGOGASTRODUODENOSCOPY (EGD) WITH ESOPHAGEAL DILATION;  Surgeon: Rogene Houston, MD;  Location: [facility];  Service: Endoscopy;  Laterality: N/A;  315  . Anterior cervical decomp/discectomy fusion N/A 10/21/2012    Procedure: Cervical Six-Seven Anterior cervical decompression/diskectomy/fusion;  Surgeon: Kristeen Miss, MD;  Location: [facility];  Service: Neurosurgery;  Laterality: N/A;  Cervical Six-Seven Anterior cervical decompression/diskectomy/fusion  . Cataract extraction w/phaco Right 11/25/2012    Procedure: CATARACT EXTRACTION PHACO AND INTRAOCULAR LENS PLACEMENT (IOC);  Surgeon: Elta Guadeloupe T. Gershon Crane, MD;  Location: [facility];  Service: Ophthalmology;  Laterality: Right;  CDE:10.06  . Cataract extraction w/phaco Left 12/09/2012    Procedure: CATARACT EXTRACTION PHACO AND INTRAOCULAR LENS PLACEMENT (IOC);  Surgeon: Elta Guadeloupe T. Gershon Crane, MD;  Location: [facility];  Service: Ophthalmology;  Laterality: Left;  CDE:5.06  . Tonsillectomy    . Hiatal hernia repair    . Heller myotomy N/A 03/17/2013    Procedure: DIAGNOSTIC LAPAROSCOPY, LAPAROSCOPIC HELLER MYOTOMY, ENDOSCOPY, DOR FUNDOPLICATION;  Surgeon: Ralene Ok, MD;  Location:  [facility];  Service: General;  Laterality: N/A;  . Breast surgery      Left breast  . Eye surgery    . Video bronchoscopy with endobronchial ultrasound N/A 07/24/2013    Procedure: VIDEO BRONCHOSCOPY WITH ENDOBRONCHIAL ULTRASOUND;  Surgeon: [name], MD;  Location: [facility];  Service: Thoracic;  Laterality: N/A;   Family History  Problem Relation Age of Onset  . Hypertension Mother   . Heart disease Mother   . Heart attack Mother   . Anxiety disorder Mother   . Lung cancer Father   . Heart attack Father   . Alcohol abuse Father   . Hypertension Brother   . Alcohol abuse Brother   . Bipolar disorder Brother   . ADD / ADHD Brother   . Drug abuse Brother   . OCD Brother   . Alcohol abuse Sister   . Bipolar disorder Sister   . Anxiety disorder Sister   . ADD / ADHD Sister   . Drug abuse Sister   . Physical abuse Sister   . Sexual abuse Sister   . Alcohol abuse Brother   . Bipolar disorder Brother   . ADD / ADHD Brother   . Drug abuse Brother   . Alcohol abuse Sister   . Bipolar disorder Sister   . Anxiety disorder Sister   . ADD / ADHD Sister   . Physical abuse Sister   . Sexual abuse Sister   . Alcohol abuse Brother   . Bipolar disorder Brother   . ADD / ADHD Brother   . Bipolar disorder Brother   . ADD / ADHD Brother   . Alcohol abuse Brother   . Paranoid behavior Brother   . Physical abuse Brother   . Sexual abuse Brother   . Bipolar disorder Maternal Aunt   . Bipolar disorder Paternal Aunt   . Bipolar disorder Maternal Uncle   . Bipolar disorder Paternal Uncle   . Bipolar disorder Maternal Grandfather   . Alcohol abuse Maternal Grandfather   . Bipolar disorder Maternal Grandmother   . Alcohol abuse Maternal Grandmother   . Anxiety disorder Maternal Grandmother   . Dementia Maternal Grandmother   . Bipolar disorder Paternal Grandfather   . Alcohol abuse Paternal Grandfather   . Bipolar disorder Paternal Grandmother   . Alcohol abuse Paternal Grandmother   .  Drug abuse Paternal Grandmother   . Bipolar disorder Maternal Uncle   . Schizophrenia Neg Hx   . Seizures Neg Hx    History  Substance Use Topics  . Smoking status: Current Some Day Smoker -- 0.25 packs/day for 40 years    Types: Cigarettes, E-cigarettes  . Smokeless tobacco: Never Used     Comment: 5 cigs day 03/12/13, 08/12/13 currently 1 ppd  . Alcohol Use: No   OB History   Grav Para Term Preterm Abortions TAB SAB Ect Mult Living   2 1 1  1    1  1     Review of Systems  Constitutional: Negative for appetite change and fatigue.  HENT: Negative for congestion, ear discharge and sinus pressure.   Eyes: Negative for discharge.  Respiratory: Negative for cough.   Cardiovascular: Negative for chest pain.  Gastrointestinal: Negative for abdominal pain and diarrhea.  Genitourinary: Negative for frequency and hematuria.  Musculoskeletal: Negative for back pain.  Skin: Negative for rash.  Neurological: Positive for weakness. Negative for seizures and headaches.  Psychiatric/Behavioral: Negative for hallucinations.      Allergies  Codeine; Cyclobenzaprine; Darvocet; Abilify; Ace inhibitors; Adhesive; Gabapentin; Iodine; Levofloxacin; Prednisone; Sulfa antibiotics; and Robaxin  Home Medications   Prior to Admission medications   Medication Sig Start Date End Date Taking? Authorizing Provider  albuterol (PROVENTIL HFA;VENTOLIN HFA) 108 (90 BASE) MCG/ACT inhaler Inhale 2 puffs into the lungs 3 (three) times daily as needed for wheezing or shortness of breath.    Historical Provider, MD  amitriptyline (ELAVIL) 100 MG tablet Take 1 tablet (100 mg total) by mouth at bedtime. 08/26/13   Levonne Spiller, MD  clonazePAM (KLONOPIN) 1 MG tablet Take 1 tablet (1 mg total) by mouth 4 (four) times daily. 08/26/13   Levonne Spiller, MD  dexlansoprazole (DEXILANT) 60 MG capsule Take 1 capsule (60 mg total) by mouth daily. 09/04/13   Butch Penny, NP  divalproex (DEPAKOTE ER) 500 MG 24 hr tablet Take 1  tablet (500 mg total) by mouth daily. 08/26/13   Levonne Spiller, MD  donepezil (ARICEPT) 5 MG tablet Take 1 tablet (5 mg total) by mouth at bedtime. 06/18/13   Alycia Rossetti, MD  DULoxetine (CYMBALTA) 60 MG capsule Take 1 capsule (60 mg total) by mouth every morning. 08/26/13   Levonne Spiller, MD  emollient (BIAFINE) cream Apply 1 application topically 2 (two) times daily. Apply to affected chest area after rad tx and bedtime, just not 4 hours prior to having rad txs, also use on the weekends 08/26/13   Thea Silversmith, MD  EPINEPHrine (EPIPEN) 0.3 mg/0.3 mL IJ SOAJ injection Inject 0.3 mLs (0.3 mg total) into the muscle once. PRN FOR SEVERE ALLERGIC REACTION 08/21/13   Rexene Alberts, MD  feeding supplement, ENSURE COMPLETE, (ENSURE COMPLETE) LIQD Take 237 mLs by mouth daily. 09/21/13   Robbie Lis, MD  fentaNYL (DURAGESIC - DOSED MCG/HR) 50 MCG/HR Place 1 patch (50 mcg total) onto the skin every 3 (three) days. 09/25/13   Alycia Rossetti, MD  levothyroxine (SYNTHROID, LEVOTHROID) 25 MCG tablet Take 25 mcg by mouth daily before breakfast.    Historical Provider, MD  lidocaine (LIDODERM) 5 % Place 1 patch onto the skin daily. Remove & Discard patch within 12 hours or as directed by MD    Historical Provider, MD  lidocaine-prilocaine (EMLA) cream Apply 1 application topically as needed. Apply to port 1 hr before chemo 09/25/13   Curt Bears, MD  metoprolol tartrate (LOPRESSOR) 25 MG tablet Take 12.5 mg by mouth 2 (two) times daily with a meal. 05/26/13   Nena Polio, PA-C  ondansetron (ZOFRAN) 4 MG tablet Take 1 tablet (4 mg total) by mouth every 6 (six) hours as needed for refractory nausea / vomiting. 09/21/13   Robbie Lis, MD  oxyCODONE-acetaminophen (PERCOCET/ROXICET) 5-325 MG per tablet Take 1 tablet by mouth every 6 (six) hours as needed for severe pain. 10/09/13   Curt Bears, MD  pravastatin (PRAVACHOL) 40 MG tablet TAKE (1) TABLET BY MOUTH EVERY EVENING FOR HIGH CHOLESTEROL. 09/23/13   Modena Nunnery  Okarche, MD  predniSONE (DELTASONE) 2.5 MG tablet Take 3 tablets (7.5 mg total) by mouth daily with breakfast. 05/26/13   Nena Polio, PA-C  prochlorperazine (COMPAZINE) 10 MG tablet Take 1 tablet (10 mg total) by mouth every 6 (six) hours as needed for nausea or vomiting. 09/21/13   Robbie Lis, MD  Tiotropium Bromide Monohydrate (SPIRIVA RESPIMAT) 2.5 MCG/ACT AERS Inhale 2 puffs into the lungs daily.    Historical Provider, MD   BP 100/55  Pulse 106  Temp(Src) 98.9 F (37.2 C) (Oral)  Resp 16  Ht 5\' 7"  (1.702 m)  Wt 194 lb (87.998 kg)  BMI 30.38 kg/m2  SpO2 100% Physical Exam  Nursing note and vitals reviewed. Constitutional: She is oriented to person, place, and time. She appears well-developed.  HENT:  Head: Normocephalic.  Dry mucous membrane  Eyes: Conjunctivae and EOM are normal. No scleral icterus.  Neck: Neck supple. No thyromegaly present.  Cardiovascular: Normal rate and regular rhythm.  Exam reveals no gallop and no friction rub.   No murmur heard. Pulmonary/Chest: No stridor. She has no wheezes. She has no rales. She exhibits no tenderness.  Abdominal: She exhibits no distension. There is no tenderness. There is no rebound.  Musculoskeletal: Normal range of motion. She exhibits no edema.  Lymphadenopathy:    She has no cervical adenopathy.  Neurological: She is oriented to person, place, and time. She exhibits normal muscle tone. Coordination normal.  Skin: No rash noted. No erythema.  Psychiatric: She has a normal mood and affect. Her behavior is normal.    ED Course  Procedures (including critical care time) DIAGNOSTIC STUDIES: Oxygen Saturation is 100% on RA, normal by my interpretation.    COORDINATION OF CARE: 4:43 PM- Pt advised of plan for treatment and pt agrees.  Labs Review Labs Reviewed  CBC WITH DIFFERENTIAL  COMPREHENSIVE METABOLIC PANEL    Imaging Review No results found.   EKG Interpretation None      MDM   Final diagnoses:   None    At discharge pt feeling much better with one liter of saline.  She stated that she is to get a Neupogen shot tomorrow.  She will follow up in the am  The chart was scribed for me under my direct supervision.  I personally performed the history, physical, and medical decision making and all procedures in the evaluation of this patient.Maudry Diego, MD 10/15/13 [phone]

## 2013-10-15 NOTE — Assessment & Plan Note (Signed)
Patient initiated cycle 3 of her cisplatin/etoposide chemotherapy regimen on 10/06/2013.  She is scheduled to initiate cycle 4 of the same regimen on 10/27/2013.  She also continues with daily radiation treatments.  She'll complete her radiation treatments on 10/22/2013.  Patient has missed her restaging chest CT a few different times due to transportation issues-and will require a restaging chest CT with contrast week of 11/02/2013.

## 2013-10-15 NOTE — Assessment & Plan Note (Signed)
Patient continues to complain of some chronic fatigue.  The most likely, fatigue is multifactorial; and is a result of both chemotherapy and radiation therapy.  Patient was encouraged to remain as active as possible.

## 2013-10-15 NOTE — ED Notes (Signed)
Updated pt regarding wait for transportation.

## 2013-10-15 NOTE — Assessment & Plan Note (Addendum)
Patient continues to complain of some chronic, generalized pain.  She has had her fentanyl patch increased to 50 mcg; and continues to take oxycodone for breakthrough pain.  She appeared fairly comfortable on exam today; and stated that she did not need any refills of her pain medications.

## 2013-10-15 NOTE — ED Notes (Signed)
[facility] here to transport pt home.

## 2013-10-15 NOTE — Assessment & Plan Note (Signed)
Patient's platelet count has decreased from 314 down to 83.   This is a chemotherapy side effect.  Patient denies any new issues with either easy bleeding or bruising.

## 2013-10-15 NOTE — Discharge Instructions (Signed)
Follow up tomorrow as planned to get your Neupogen shot

## 2013-10-15 NOTE — ED Notes (Signed)
Nausea, Pt is treated at [facility] for lung cancer. Says she was told to come to ER for tx of dehydration. Pt has a porta cath

## 2013-10-15 NOTE — Assessment & Plan Note (Signed)
Patient does appear dehydrated today; and was encouraged to receive IV fluid rehydration while at the [facility] today.  However, patient did refuse IV fluids today; stating that she prefers to go home and take a nap.  Patient has plans to return tomorrow for her next radiation treatment; and will receive 1 L IV fluid rehydration at that time.  These orders have already been placed.

## 2013-10-15 NOTE — ED Notes (Signed)
MD at bedside. 

## 2013-10-15 NOTE — Assessment & Plan Note (Signed)
Blood pressure following patient's radiation treatment today decreased to 91/68 while standing.  Orthostatic vital signs obtained just prior to this exam revealed a sitting blood pressure of 81/55.  Patient is complaining of worsening fatigue/weakness over the past several days.  Orthostatic hypotension could be due to dehydration; but could also be related to patient's pain medication and psych meds.  Also, patient takes metoprolol 12.5 mg  Twice daily.  She did confirm that she took the metoprolol earlier today.  Advised the patient that she would most likely benefit from IV fluid rehydration today; but patient refused-stating that she prefers to go home and take a nap.  She stated that she returns to the [facility] tomorrow 10/15/2013 for her next radiation treatment; and can receive IV fluid rehydration prior to her radiation treatment.  Patient was also advised to hold her metoprolol for the next few days; and to recheck her blood pressure once she gets back home.  Advised patient that I would call her primary care physician Dr. Buelah Manis; and let him know the plan to hold the metoprolol.

## 2013-10-15 NOTE — ED Notes (Signed)
Implanted port deaccessed. Nad.

## 2013-10-15 NOTE — Assessment & Plan Note (Signed)
Patient's potassium today was 5.5.  Patient confirmed that she does not take any potassium tablets or supplements.  Elevated potassium level may be due to dehydration.  Will recheck potassium level tomorrow.

## 2013-10-15 NOTE — Assessment & Plan Note (Signed)
Albumin has actually improved from 3.1-3.3.  Patient was encouraged to continue to push protein in her diet is much as possible.

## 2013-10-15 NOTE — Telephone Encounter (Signed)
Patient missed her radiation treatment today; as well as her lab/followup visit/IV fluid rehydration appointments.  Called patient to check with her; and patient informed this provider that her scheduled transportation did not arrive to pick her up.  She gave me the number  of the transportation service to call again for her.  Patient states that she continues to feel very weak and dehydrated.  She states that she has been holding her blood pressure medication since yesterday evening per directions.  She states that she did experience a fall around 11:00 this morning; but did not hit her head or lose consciousness.  Patient appeared somewhat sedated on foam.  Again called the transportation service number that patient had given me; and was informed by the nurse that answered that patient does frequently sleep through phone calls and [place] her poor per the transportation service staff.  Spoke again to the patient and she stated that she has no other way to come to either the [facility] or emergency department; so she has called EMS to transport her to [facility] emergency department for further evaluation.  I then called [facility] ED and gave brief history to charge nurse prior to patient's arrival.

## 2013-10-15 NOTE — ED Notes (Signed)
Called [facility] transportation for patient a ride home. Will be here within 30 min. JGW

## 2013-10-15 NOTE — Progress Notes (Signed)
SYMPTOM MANAGEMENT CLINIC   HPI: Tonya Sullivan 58 y.o. female diagnosed with lung cancer.  Currently undergoing cisplatin/etoposide chemotherapy regimen and radiation therapy.  Patient received cycle 3 of her cisplatin/etoposide chemotherapy on 10/06/2013.  She continues with daily radiation treatments; which will be completed on 10/22/2013.  Patient presented today for her radiation treatments; it was noted that her blood pressure was quite low.  Standing up blood pressure was measured at 91/68.  Patient is complaining of increasing fatigue and weakness.  She also feels fairly dehydrated.  She states that her chronic pain is fairly well managed at this time with both a fentanyl 50 mcg patch in oxycodone for breakthrough pain.  She also continues to take prednisone 7.5 mg on a daily basis for her adrenal insufficiency.  Patient did confirm that she continues to take metoprolol 0.5 mg twice daily.  Patient denies any new nausea/vomiting, diarrhea/constipation, or fever/chills.  Also, patient has missed her restaging chest CT with contrast a few different times due to transportation issues; and will need this rescheduled.   HPI  CURRENT THERAPY: Upcoming Treatment Dates - LUNG SMALL CELL - LIMITED STAGE Cisplatin D1 / Etoposide D1-3 q21d Days with orders from any treatment category:  10/27/2013      SCHEDULING COMMUNICATION      palonosetron (ALOXI) injection 0.25 mg      Dexamethasone Sodium Phosphate (DECADRON) injection 12 mg      fosaprepitant (EMEND) 150 mg in sodium chloride 0.9 % 145 mL IVPB      CISplatin (PLATINOL) 123 mg in sodium chloride 0.9 % 500 mL chemo infusion      etoposide (VEPESID) 250 mg in sodium chloride 0.9 % 650 mL chemo infusion      sodium chloride 0.9 % injection 10 mL      heparin lock flush 100 unit/mL      heparin lock flush 100 unit/mL      alteplase (CATHFLO ACTIVASE) injection 2 mg      sodium chloride 0.9 % injection 3 mL      Hot Pack 1 packet      0.9 %  sodium chloride infusion      dextrose 5 % and 0.45% NaCl 1,000 mL with potassium chloride 20 mEq, magnesium sulfate 12 mEq, mannitol 12.5 g infusion      TREATMENT CONDITIONS 10/28/2013      SCHEDULING COMMUNICATION      dexamethasone (DECADRON) injection 10 mg      etoposide (VEPESID) 250 mg in sodium chloride 0.9 % 650 mL chemo infusion      sodium chloride 0.9 % injection 10 mL      heparin lock flush 100 unit/mL      heparin lock flush 100 unit/mL      alteplase (CATHFLO ACTIVASE) injection 2 mg      sodium chloride 0.9 % injection 3 mL      Hot Pack 1 packet      0.9 %  sodium chloride infusion 10/29/2013      SCHEDULING COMMUNICATION      dexamethasone (DECADRON) injection 10 mg      etoposide (VEPESID) 250 mg in sodium chloride 0.9 % 650 mL chemo infusion      sodium chloride 0.9 % injection 10 mL      heparin lock flush 100 unit/mL      heparin lock flush 100 unit/mL      alteplase (CATHFLO ACTIVASE) injection 2 mg      sodium  chloride 0.9 % injection 3 mL      Hot Pack 1 packet      0.9 %  sodium chloride infusion    ROS  Past Medical History  Diagnosis Date  . COPD (chronic obstructive pulmonary disease)   . Depression   . Chronic back pain     DDD, disc bulge, radiculopathy, spinal stenosis  . Hyperlipidemia   . Anxiety   . Bipolar disorder, unspecified   . Carotid artery calcification   . DDD (degenerative disc disease), lumbosacral   . Spinal stenosis, lumbar   . Autoimmune thyroiditis   . Essential hypertension, benign   . TIA (transient ischemic attack)     August 2014  . GERD (gastroesophageal reflux disease)   . Neuropathic pain   . Addison's disease     On Prednisone  . History of pneumonia   . Achalasia   . Melanoma   . Frequent falls   . DM (diabetes mellitus) with complications   . 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 Hypothyroidism     Past Surgical History  Procedure Laterality Date  . Appendectomy    .  Cholecystectomy    . Lump left breast      Benign  . Abdominal hysterectomy      Tubal pregnancy  . Inguinal hernia repair    . Stomach surgery    . Ectopic pregnancy surgery    . Back surgery      x 5;1984;1989;;1999;2000;2010  . Colonoscopy  01/25/2012    Procedure: COLONOSCOPY;  Surgeon: Rogene Houston, MD;  Location: [facility];  Service: Endoscopy;  Laterality: N/A;  130  . Esophagogastroduodenoscopy (egd) with esophageal dilation N/A 08/13/2012    Procedure: ESOPHAGOGASTRODUODENOSCOPY (EGD) WITH ESOPHAGEAL DILATION;  Surgeon: Rogene Houston, MD;  Location: [facility];  Service: Endoscopy;  Laterality: N/A;  315  . Anterior cervical decomp/discectomy fusion N/A 10/21/2012    Procedure: Cervical Six-Seven Anterior cervical decompression/diskectomy/fusion;  Surgeon: Kristeen Miss, MD;  Location: [facility];  Service: Neurosurgery;  Laterality: N/A;  Cervical Six-Seven Anterior cervical decompression/diskectomy/fusion  . Cataract extraction w/phaco Right 11/25/2012    Procedure: CATARACT EXTRACTION PHACO AND INTRAOCULAR LENS PLACEMENT (IOC);  Surgeon: Elta Guadeloupe T. Gershon Crane, MD;  Location: [facility];  Service: Ophthalmology;  Laterality: Right;  CDE:10.06  . Cataract extraction w/phaco Left 12/09/2012    Procedure: CATARACT EXTRACTION PHACO AND INTRAOCULAR LENS PLACEMENT (IOC);  Surgeon: Elta Guadeloupe T. Gershon Crane, MD;  Location: [facility];  Service: Ophthalmology;  Laterality: Left;  CDE:5.06  . Tonsillectomy    . Hiatal hernia repair    . Heller myotomy N/A 03/17/2013    Procedure: DIAGNOSTIC LAPAROSCOPY, LAPAROSCOPIC HELLER MYOTOMY, ENDOSCOPY, DOR FUNDOPLICATION;  Surgeon: Ralene Ok, MD;  Location: [facility];  Service: General;  Laterality: N/A;  . Breast surgery      Left breast  . Eye surgery    . Video bronchoscopy with endobronchial ultrasound N/A 07/24/2013    Procedure: VIDEO BRONCHOSCOPY WITH ENDOBRONCHIAL ULTRASOUND;  Surgeon: [name], MD;  Location: [facility];  Service: Thoracic;   Laterality: N/A;    has Hyperlipidemia; Depression; Chronic pain; COPD (chronic obstructive pulmonary disease); Tobacco use; Chronic back pain; Bipolar disorder; Carotid stenosis; Essential hypertension; Mole of skin; Fall; Hoarse voice quality; Spontaneous ecchymoses; TIA (transient ischemic attack); Dysarthria; Stricture and stenosis of esophagus; Dysphagia; Peripheral edema; Urinary incontinence; Adrenal cortical insufficiency; Cervical spondylosis without myelopathy; Gait disturbance; Chalasia of lower esophageal sphincter; MDD (major depressive disorder), recurrent severe,  without psychosis; PTSD (post-traumatic stress disorder); Unspecified episodic mood disorder; Memory problem; Lung mass; GERD (gastroesophageal reflux disease); Small cell carcinoma of lung; Elevated troponin; Anemia, unspecified; Obesity, morbid; HTN (hypertension); Hypothyroidism; Unspecified protein-calorie malnutrition; Low BP; Fatigue; Decreased mobility; Physical deconditioning; Hypoalbuminemia; Hypersensitivity reaction; Orthostatic hypotension; Dehydration; Hyperkalemia; and Thrombocytopenia on her problem list.     is allergic to codeine; cyclobenzaprine; darvocet; abilify; ace inhibitors; adhesive; gabapentin; iodine; levofloxacin; prednisone; sulfa antibiotics; and robaxin.    Medication List       This list is accurate as of: 10/14/13 11:59 PM.  Always use your most recent med list.               albuterol 108 (90 BASE) MCG/ACT inhaler  Commonly known as:  PROVENTIL HFA;VENTOLIN HFA  Inhale 2 puffs into the lungs 3 (three) times daily as needed for wheezing or shortness of breath.     amitriptyline 100 MG tablet  Commonly known as:  ELAVIL  Take 1 tablet (100 mg total) by mouth at bedtime.     clonazePAM 1 MG tablet  Commonly known as:  KLONOPIN  Take 1 tablet (1 mg total) by mouth 4 (four) times daily.     dexlansoprazole 60 MG capsule  Commonly known as:  DEXILANT  Take 1 capsule (60 mg total) by  mouth daily.     divalproex 500 MG 24 hr tablet  Commonly known as:  DEPAKOTE ER  Take 1 tablet (500 mg total) by mouth daily.     donepezil 5 MG tablet  Commonly known as:  ARICEPT  Take 1 tablet (5 mg total) by mouth at bedtime.     DULoxetine 60 MG capsule  Commonly known as:  CYMBALTA  Take 1 capsule (60 mg total) by mouth every morning.     emollient cream  Commonly known as:  BIAFINE  Apply 1 application topically 2 (two) times daily. Apply to affected chest area after rad tx and bedtime, just not 4 hours prior to having rad txs, also use on the weekends     EPINEPHrine 0.3 mg/0.3 mL Soaj injection  Commonly known as:  EPIPEN  Inject 0.3 mLs (0.3 mg total) into the muscle once. PRN FOR SEVERE ALLERGIC REACTION     feeding supplement (ENSURE COMPLETE) Liqd  Take 237 mLs by mouth daily.     fentaNYL 50 MCG/HR  Commonly known as:  DURAGESIC - dosed mcg/hr  Place 1 patch (50 mcg total) onto the skin every 3 (three) days.     levothyroxine 25 MCG tablet  Commonly known as:  SYNTHROID, LEVOTHROID  Take 25 mcg by mouth daily before breakfast.     lidocaine 5 %  Commonly known as:  LIDODERM  Place 1 patch onto the skin daily. Remove & Discard patch within 12 hours or as directed by MD     lidocaine-prilocaine cream  Commonly known as:  EMLA  Apply 1 application topically as needed. Apply to port 1 hr before chemo     metoprolol tartrate 25 MG tablet  Commonly known as:  LOPRESSOR  Take 12.5 mg by mouth 2 (two) times daily with a meal.     ondansetron 4 MG tablet  Commonly known as:  ZOFRAN  Take 1 tablet (4 mg total) by mouth every 6 (six) hours as needed for refractory nausea / vomiting.     oxyCODONE-acetaminophen 5-325 MG per tablet  Commonly known as:  PERCOCET/ROXICET  Take 1 tablet by mouth every 6 (six) hours as  needed for severe pain.     pravastatin 40 MG tablet  Commonly known as:  PRAVACHOL  TAKE (1) TABLET BY MOUTH EVERY EVENING FOR HIGH CHOLESTEROL.       predniSONE 2.5 MG tablet  Commonly known as:  DELTASONE  Take 3 tablets (7.5 mg total) by mouth daily with breakfast.     prochlorperazine 10 MG tablet  Commonly known as:  COMPAZINE  Take 1 tablet (10 mg total) by mouth every 6 (six) hours as needed for nausea or vomiting.     SPIRIVA RESPIMAT 2.5 MCG/ACT Aers  Generic drug:  Tiotropium Bromide Monohydrate  Inhale 2 puffs into the lungs daily.         PHYSICAL EXAMINATION  Blood pressure 81/55, pulse 96, height 5\' 6"  (1.676 m), weight 195 lb 14.4 oz (88.86 kg), SpO2 98.00%.  Physical Exam  Nursing note and vitals reviewed. Constitutional: She is oriented to person, place, and time. She appears dehydrated. She appears unhealthy.  HENT:  Head: Normocephalic and atraumatic.  Mouth/Throat: Oropharynx is clear and moist.  Eyes: Conjunctivae and EOM are normal. Pupils are equal, round, and reactive to light. Right eye exhibits no discharge. Left eye exhibits no discharge. No scleral icterus.  Neck: Normal range of motion. Neck supple. No JVD present. No tracheal deviation present. No thyromegaly present.  Cardiovascular: Normal rate, regular rhythm, normal heart sounds and intact distal pulses.  Exam reveals no friction rub.   No murmur heard. Pulmonary/Chest: Effort normal and breath sounds normal. No respiratory distress. She has no wheezes.  Abdominal: Soft. Bowel sounds are normal. She exhibits no distension. There is no tenderness. There is no rebound.  Musculoskeletal: Normal range of motion. She exhibits no edema and no tenderness.  Lymphadenopathy:    She has no cervical adenopathy.  Neurological: She is oriented to person, place, and time.  Patient appears mildly sedated on exam; but will answer all questions appropriate follow all commands.  Skin: Skin is warm and dry. No rash noted. No erythema.  Psychiatric: Affect normal.    LABORATORY DATA:. Hospital Outpatient Visit on 10/14/2013  Component Date Value Ref  Range Status  . WBC 10/14/2013 2.4* 3.9 - 10.3 10e3/uL Final  . NEUT# 10/14/2013 1.9  1.5 - 6.5 10e3/uL Final  . HGB 10/14/2013 10.3* 11.6 - 15.9 g/dL Final  . HCT 10/14/2013 31.6* 34.8 - 46.6 % Final  . Platelets 10/14/2013 83* 145 - 400 10e3/uL Final  . MCV 10/14/2013 93.5  79.5 - 101.0 fL Final  . MCH 10/14/2013 30.5  25.1 - 34.0 pg Final  . MCHC 10/14/2013 32.6  31.5 - 36.0 g/dL Final  . RBC 10/14/2013 3.38* 3.70 - 5.45 10e6/uL Final  . RDW 10/14/2013 17.9* 11.2 - 14.5 % Final  . lymph# 10/14/2013 0.4* 0.9 - 3.3 10e3/uL Final  . MONO# 10/14/2013 0.1  0.1 - 0.9 10e3/uL Final  . Eosinophils Absolute 10/14/2013 0.1  0.0 - 0.5 10e3/uL Final  . Basophils Absolute 10/14/2013 0.0  0.0 - 0.1 10e3/uL Final  . NEUT% 10/14/2013 77.9* 38.4 - 76.8 % Final  . LYMPH% 10/14/2013 15.4  14.0 - 49.7 % Final  . MONO% 10/14/2013 3.3  0.0 - 14.0 % Final  . EOS% 10/14/2013 2.1  0.0 - 7.0 % Final  . BASO% 10/14/2013 1.3  0.0 - 2.0 % Final  . nRBC 10/14/2013 0  0 - 0 % Final  . Sodium 10/14/2013 136  136 - 145 mEq/L Final  . Potassium 10/14/2013 5.5* 3.5 -  5.1 mEq/L Final  . Chloride 10/14/2013 103  98 - 109 mEq/L Final  . CO2 10/14/2013 24  22 - 29 mEq/L Final  . Glucose 10/14/2013 110  70 - 140 mg/dl Final  . BUN 10/14/2013 17.2  7.0 - 26.0 mg/dL Final  . Creatinine 10/14/2013 0.9  0.6 - 1.1 mg/dL Final  . Total Bilirubin 10/14/2013 <0.20  0.20 - 1.20 mg/dL Final  . Alkaline Phosphatase 10/14/2013 97  40 - 150 U/L Final  . AST 10/14/2013 9  5 - 34 U/L Final  . ALT 10/14/2013 8  0 - 55 U/L Final  . Total Protein 10/14/2013 6.7  6.4 - 8.3 g/dL Final  . Albumin 10/14/2013 3.3* 3.5 - 5.0 g/dL Final  . Calcium 10/14/2013 9.2  8.4 - 10.4 mg/dL Final  . Anion Gap 10/14/2013 9  3 - 11 mEq/L Final  . Magnesium 10/14/2013 2.2  1.5 - 2.5 mg/dl Final     RADIOGRAPHIC STUDIES: No results found.  ASSESSMENT/PLAN:    Chronic pain  Assessment & Plan Patient continues to complain of some chronic,  generalized pain.  She has had her fentanyl patch increased to 50 mcg; and continues to take oxycodone for breakthrough pain.  She appeared fairly comfortable on exam today; and stated that she did not need any refills of her pain medications.   Small cell carcinoma of lung  Assessment & Plan Patient initiated cycle 3 of her cisplatin/etoposide chemotherapy regimen on 10/06/2013.  She is scheduled to initiate cycle 4 of the same regimen on 10/27/2013.  She also continues with daily radiation treatments.  She'll complete her radiation treatments on 10/22/2013.  Patient has missed her restaging chest CT a few different times due to transportation issues-and will require a restaging chest CT with contrast week of 11/02/2013.    Fatigue  Assessment & Plan Patient continues to complain of some chronic fatigue.  The most likely, fatigue is multifactorial; and is a result of both chemotherapy and radiation therapy.  Patient was encouraged to remain as active as possible.   Hypoalbuminemia  Assessment & Plan Albumin has actually improved from 3.1-3.3.  Patient was encouraged to continue to push protein in her diet is much as possible.   Orthostatic hypotension  Assessment & Plan Blood pressure following patient's radiation treatment today decreased to 91/68 while standing.  Orthostatic vital signs obtained just prior to this exam revealed a sitting blood pressure of 81/55.  Patient is complaining of worsening fatigue/weakness over the past several days.  Orthostatic hypotension could be due to dehydration; but could also be related to patient's pain medication and psych meds.  Also, patient takes metoprolol 12.5 mg  Twice daily.  She did confirm that she took the metoprolol earlier today.  Advised the patient that she would most likely benefit from IV fluid rehydration today; but patient refused-stating that she prefers to go home and take a nap.  She stated that she returns to the [facility]  tomorrow 10/15/2013 for her next radiation treatment; and can receive IV fluid rehydration prior to her radiation treatment.  Patient was also advised to hold her metoprolol for the next few days; and to recheck her blood pressure once she gets back home.  Advised patient that I would call her primary care physician Dr. Buelah Manis; and let him know the plan to hold the metoprolol.   Dehydration  Assessment & Plan Patient does appear dehydrated today; and was encouraged to receive IV fluid rehydration while at the cancer  Center today.  However, patient did refuse IV fluids today; stating that she prefers to go home and take a nap.  Patient has plans to return tomorrow for her next radiation treatment; and will receive 1 L IV fluid rehydration at that time.  These orders have already been placed.   Hyperkalemia  Assessment & Plan Patient's potassium today was 5.5.  Patient confirmed that she does not take any potassium tablets or supplements.  Elevated potassium level may be due to dehydration.  Will recheck potassium level tomorrow.   Thrombocytopenia  Assessment & Plan Patient's platelet count has decreased from 314 down to 83.   This is a chemotherapy side effect.  Patient denies any new issues with either easy bleeding or bruising.   Patient stated understanding of all instructions; and was in agreement with this plan of care. The patient knows to call the clinic with any problems, questions or concerns.   Review/collaboration with Dr. Julien Nordmann and Dr. Pablo Ledger regarding all aspects of patient's visit today.   Total time spent with patient was 40 minutes;  with greater than 75 percent of that time spent in face to face counseling regarding her symptoms, and coordination of care and follow up.  Disclaimer: This note was dictated with voice recognition software. Similar sounding words can inadvertently be transcribed and may not be corrected upon review.   Drue Second, NP 10/15/2013

## 2013-10-15 NOTE — ED Notes (Signed)
CRITICAL VALUE ALERT  Critical value received:  WBC 1.0  Date of notification:  10/15/13  Time of notification:  1314  Critical value read back:Yes.    Nurse who received alert:  GM  MD notified (1st page):  [id number]  Time of first page:  1746  MD notified (2nd page):  Time of second page:  Responding MD:  Tollie Pizza

## 2013-10-16 ENCOUNTER — Telehealth: Payer: Self-pay | Admitting: Family Medicine

## 2013-10-16 ENCOUNTER — Other Ambulatory Visit: Payer: Self-pay | Admitting: Nurse Practitioner

## 2013-10-16 ENCOUNTER — Ambulatory Visit (HOSPITAL_BASED_OUTPATIENT_CLINIC_OR_DEPARTMENT_OTHER): Payer: Medicare Other

## 2013-10-16 ENCOUNTER — Other Ambulatory Visit: Payer: Self-pay | Admitting: *Deleted

## 2013-10-16 ENCOUNTER — Other Ambulatory Visit: Payer: Self-pay

## 2013-10-16 ENCOUNTER — Other Ambulatory Visit: Payer: Self-pay | Admitting: Physician Assistant

## 2013-10-16 ENCOUNTER — Telehealth: Payer: Self-pay | Admitting: Medical Oncology

## 2013-10-16 ENCOUNTER — Other Ambulatory Visit: Payer: Self-pay | Admitting: Hematology and Oncology

## 2013-10-16 ENCOUNTER — Telehealth: Payer: Self-pay | Admitting: *Deleted

## 2013-10-16 ENCOUNTER — Inpatient Hospital Stay
Admission: RE | Admit: 2013-10-16 | Discharge: 2013-10-16 | Disposition: A | Discharge status: Home / Self Care | Payer: Self-pay | Source: Ambulatory Visit | Attending: Radiation Oncology | Admitting: Radiation Oncology

## 2013-10-16 ENCOUNTER — Ambulatory Visit: Admission: RE | Admit: 2013-10-16 | Payer: Medicare Other | Source: Ambulatory Visit

## 2013-10-16 DIAGNOSIS — Z5189 Encounter for other specified aftercare: Secondary | ICD-10-CM

## 2013-10-16 DIAGNOSIS — C3490 Malignant neoplasm of unspecified part of unspecified bronchus or lung: Secondary | ICD-10-CM

## 2013-10-16 DIAGNOSIS — E860 Dehydration: Secondary | ICD-10-CM

## 2013-10-16 MED ORDER — SODIUM CHLORIDE 0.9 % IV SOLN: INTRAVENOUS | Status: DC

## 2013-10-16 MED ORDER — PEGFILGRASTIM INJECTION 6 MG/0.6ML
6.0000 mg | Freq: Once | SUBCUTANEOUS | Status: AC
  Administered 2013-10-16: 6 mg via SUBCUTANEOUS
  Filled 2013-10-16: qty 0.6

## 2013-10-16 NOTE — Progress Notes (Signed)
Patient in today for a Neulasta injection due to continued neutropenia.  Patient continues to hold her metoprolol as previously directed.  Orthostatic blood pressures obtained and improved.  Standing blood pressure today was 105/70.  Patient states that she would like to return this coming Monday, 10/19/2013 for IV fluid rehydration.  These orders were placed at today.  Patient also confirmed  that her home health nurse will be seeing her on Monday, 10/19/2013 as well; and will be rechecking her blood pressure.

## 2013-10-16 NOTE — Telephone Encounter (Signed)
Call placed to patient.   Reports that she is currently on her way to her oncology appointment.   States that she is no longer taking the Metoprolol.   Monna Fam is scheduled to come out and assess patient on Monday.

## 2013-10-16 NOTE — Progress Notes (Signed)
Per Dr Lindi Adie - pt to get neulast today.  Injection nurse ok'd.  Appt entered.  Order entered.  Rad RN [employer] notified - will send pt up.

## 2013-10-16 NOTE — Progress Notes (Signed)
Reviewed neutropenic precautions with pt. She repeated back to me to call for temp >100.5 f.

## 2013-10-16 NOTE — Telephone Encounter (Signed)
Nurse form [facility] called.  They wanted to let you know that this patient, who is followed by Dr Julien Nordmann, was there on 10/14 for care.  She was complaining on not feeling well.  He BP was 81/55.  She was then seen by Cindy,NP  Based on her assessment she was told to HOLD her Metoprolol 12.5 mg. Her potasium was high as well.  They offered her some IVF and she refused.  She was asked to return yesterday for follow up labs (No Show)  Also she No Show for her radiation treatment.  Nurse stated when called patient she claimed no transportation. They stressed importance for her to follow up.  She called EMS and was seen at ED yesterday.  BP was better but still low.  Potasium had come down.  They wanted you to be aware.  Still have her holding Metoprolol

## 2013-10-16 NOTE — Telephone Encounter (Signed)
NOTED

## 2013-10-16 NOTE — Telephone Encounter (Signed)
Updated re radiation. Pt received IVF at [facility] yesterday.

## 2013-10-16 NOTE — Telephone Encounter (Signed)
Called pt's primary Dr. Lonell Grandchild [place]'s office and spoke with Maudie Mercury,  [place].   Informed Maudie Mercury that pt saw symptom management NP Cindee on 10/14/13 for weakness, and abnormal lab results.  BP was 81/55.  Pt was offered IVF by NP; however, pt refused.  Cindee, NP instructed pt to HOLD  Metoprolol 12.5mg   BID due to low blood pressure.  NP wanted to make Dr. Buelah Manis aware.  Kim voiced understanding and stated she would relay message to md.

## 2013-10-16 NOTE — Progress Notes (Addendum)
Patient brought to nursing by transporter stating she was upset and had some questions.Patient states her conversation with nurse over the phone on 10/15/13 wasn't good but she did subsequently go to [facility] via ambulance to get iv fluids.We are stopping radiation all together per Dr.Wentworth  As white count 1.0 and neutrophils ar 0.4.I did speak with Dr.Gudena the on call doctor and he will order neulasta for today.Dr.Wentworth will contact Dr.Mohamed to discuss stopping the rest of chemotherapy.Patient knows to stop metoprolol .her blood pressure is better than 2 days ago.

## 2013-10-16 NOTE — Telephone Encounter (Signed)
Call and check on pt She is to stop the metoprolol for now Make sure she goes to her oncology appt today  her white blood cells were very low Make sure Kayleen Memos is coming out next week so she can recheck her blood pressure

## 2013-10-17 NOTE — Progress Notes (Signed)
  Radiation Oncology         [phone]) [phone] ________________________________  Name: Tonya Sullivan MRN: [id number]  Date: 10/14/2013  DOB: 1955-04-28  End of Treatment Note  Diagnosis:   Limited stage small cell lung cancer     Indication for treatment:  Curative       Radiation treatment dates:   08/24/2013-10/14/2013  Site/dose:   Right upper lobe to 40 out of a planned 60 Gy  Beams/energy:   3D conformal radiation with 6, 10 and 15 MV photons   Narrative: The patient tolerated radiation treatment poorly. She developed facial edema and significant neutropenia after chemotherapy and was put on break for over a week. She continued to struggle with transportation difficulties and dehydration.  Ultimately due to her multiple hospitalizations and poor tolerance of treatment, we decided to truncate her treatments early.  She did not have dysphagia during treatment and was able to maintain her weight.   Plan: The patient has completed radiation treatment. The patient will return to radiation oncology clinic for routine followup in one week. I advised her to call or return sooner if they have any questions or concerns related to their recovery or treatment.  ------------------------------------------------  Thea Silversmith, MD

## 2013-10-19 ENCOUNTER — Ambulatory Visit: Payer: Medicare Other

## 2013-10-19 ENCOUNTER — Ambulatory Visit: Payer: Self-pay

## 2013-10-20 ENCOUNTER — Ambulatory Visit: Payer: Medicare Other

## 2013-10-21 ENCOUNTER — Ambulatory Visit: Payer: Medicare Other

## 2013-10-22 ENCOUNTER — Encounter: Payer: Self-pay | Admitting: Radiation Oncology

## 2013-10-22 ENCOUNTER — Ambulatory Visit: Payer: Medicare Other

## 2013-10-22 ENCOUNTER — Ambulatory Visit
Admission: RE | Admit: 2013-10-22 | Discharge: 2013-10-22 | Disposition: A | Discharge status: Home / Self Care | Payer: Medicare Other | Source: Ambulatory Visit | Attending: Radiation Oncology | Admitting: Radiation Oncology

## 2013-10-22 VITALS — BP 107/73 | HR 98 | Temp 98.4°F | Ht 67.0 in | Wt 199.4 lb

## 2013-10-22 DIAGNOSIS — C3490 Malignant neoplasm of unspecified part of unspecified bronchus or lung: Secondary | ICD-10-CM

## 2013-10-22 DIAGNOSIS — R918 Other nonspecific abnormal finding of lung field: Secondary | ICD-10-CM

## 2013-10-22 NOTE — Progress Notes (Signed)
Ms. Randol  Here for reassessment s/p radiation therapy to her right lung.  Her main concern is her lumbar and bilateral hip pain which she grades as a level 7/10.  VSS stable and O2 Sat 94 % of Room Air.   Note intermittent wheezing.  She reports continuous fatigue and is traveling by wheelchair today.  Accompanied by her mother and her Bradd Canary.

## 2013-10-22 NOTE — Progress Notes (Signed)
   Department of Radiation Oncology  Phone:  [phone] Fax:        [phone]   Name: Tonya Sullivan MRN: [id number]  DOB: [birth year]  Date: 10/22/2013  Follow Up Visit Note  Diagnosis: Small cell carcinoma of lung   Primary site: Lung (Right)   Staging method: AJCC 7th Edition   Clinical: Stage IIA (T1a, N1, M0) signed by Curt Bears, MD on 08/06/2013  2:02 PM   Summary: Stage IIA (T1a, N1, M0)  Summary and Interval since last radiation: 1 week from completion of 40 (out of a planned 60) Gy, treatment course complicated by compliance and low blood counts.   Interval History: Tonya Sullivan presents today for routine followup.  She is feeling tired and continues to have shortness of breath and stable back pain. She is accompanied by her mother and a [facility] NP. She has no dysphagia.   Physical Exam:  Filed Vitals:   10/22/13 1337  BP: 107/73  Pulse: 98  Temp: 98.4 F (36.9 C)  Height: 5\' 7"  (1.702 m)  Weight: 199 lb 6.4 oz (90.447 kg)   No skin changes. Alert and oriented.   IMPRESSION: Tonya Sullivan is a 58 y.o. female s/p chemoradiation for small cell lung cancer   PLAN:  She is recovering well from treatment. I feel like we were just pushing her too hard for treatment and it was too difficult for her. She will proceed on with chemo alone which I discussed with her is the most effective treatment as small cell usually fails distantly.  We discussed that she had not received a typical "curative" dose and that she amy just be able to hold her own for awhile.  She understood this and has an appointment with Dr. Ellen Henri PA next week. I will plan on seeing her back in 3 months.     Tonya Silversmith, MD

## 2013-10-23 ENCOUNTER — Ambulatory Visit: Payer: Medicare Other

## 2013-10-23 ENCOUNTER — Other Ambulatory Visit: Payer: Self-pay | Admitting: Internal Medicine

## 2013-10-26 ENCOUNTER — Ambulatory Visit (HOSPITAL_COMMUNITY): Payer: Self-pay | Admitting: Psychiatry

## 2013-10-26 ENCOUNTER — Ambulatory Visit: Payer: Medicare Other

## 2013-10-26 ENCOUNTER — Other Ambulatory Visit: Payer: Self-pay | Admitting: Family Medicine

## 2013-10-26 ENCOUNTER — Telehealth: Payer: Self-pay | Admitting: Medical Oncology

## 2013-10-26 ENCOUNTER — Telehealth: Payer: Self-pay | Admitting: Family Medicine

## 2013-10-26 ENCOUNTER — Encounter: Payer: Self-pay | Admitting: Oncology

## 2013-10-26 ENCOUNTER — Ambulatory Visit (HOSPITAL_BASED_OUTPATIENT_CLINIC_OR_DEPARTMENT_OTHER): Payer: Medicare Other | Admitting: Oncology

## 2013-10-26 ENCOUNTER — Other Ambulatory Visit (HOSPITAL_BASED_OUTPATIENT_CLINIC_OR_DEPARTMENT_OTHER): Payer: Medicare Other

## 2013-10-26 VITALS — BP 108/71 | HR 103 | Temp 99.5°F | Resp 18 | Ht 67.0 in | Wt 197.1 lb

## 2013-10-26 DIAGNOSIS — C3411 Malignant neoplasm of upper lobe, right bronchus or lung: Secondary | ICD-10-CM

## 2013-10-26 DIAGNOSIS — R5383 Other fatigue: Secondary | ICD-10-CM

## 2013-10-26 DIAGNOSIS — R0602 Shortness of breath: Secondary | ICD-10-CM

## 2013-10-26 DIAGNOSIS — C3490 Malignant neoplasm of unspecified part of unspecified bronchus or lung: Secondary | ICD-10-CM

## 2013-10-26 DIAGNOSIS — C3491 Malignant neoplasm of unspecified part of right bronchus or lung: Secondary | ICD-10-CM

## 2013-10-26 LAB — COMPREHENSIVE METABOLIC PANEL (CC13)
ALT: 9 U/L (ref 0–55)
ANION GAP: 7 meq/L (ref 3–11)
AST: 12 U/L (ref 5–34)
Albumin: 3.5 g/dL (ref 3.5–5.0)
Alkaline Phosphatase: 147 U/L (ref 40–150)
BUN: 7.2 mg/dL (ref 7.0–26.0)
CALCIUM: 9.3 mg/dL (ref 8.4–10.4)
CHLORIDE: 102 meq/L (ref 98–109)
CO2: 26 meq/L (ref 22–29)
CREATININE: 0.9 mg/dL (ref 0.6–1.1)
Glucose: 110 mg/dl (ref 70–140)
POTASSIUM: 5.1 meq/L (ref 3.5–5.1)
SODIUM: 135 meq/L — AB (ref 136–145)
TOTAL PROTEIN: 6.8 g/dL (ref 6.4–8.3)
Total Bilirubin: <0.2 mg/dL (ref 0.20–1.20)

## 2013-10-26 LAB — CBC WITH DIFFERENTIAL/PLATELET
BASO%: 0.3 % (ref 0.0–2.0)
Basophils Absolute: 0.1 10*3/uL (ref 0.0–0.1)
EOS%: 0.2 % (ref 0.0–7.0)
Eosinophils Absolute: 0.1 10*3/uL (ref 0.0–0.5)
HEMATOCRIT: 32 % — AB (ref 34.8–46.6)
HGB: 10.2 g/dL — ABNORMAL LOW (ref 11.6–15.9)
LYMPH%: 3.1 % — ABNORMAL LOW (ref 14.0–49.7)
MCH: 30.7 pg (ref 25.1–34.0)
MCHC: 31.7 g/dL (ref 31.5–36.0)
MCV: 96.6 fL (ref 79.5–101.0)
MONO#: 1.3 10*3/uL — AB (ref 0.1–0.9)
MONO%: 4.3 % (ref 0.0–14.0)
NEUT#: 28.7 10*3/uL — ABNORMAL HIGH (ref 1.5–6.5)
NEUT%: 92.1 % — ABNORMAL HIGH (ref 38.4–76.8)
Platelets: 257 10*3/uL (ref 145–400)
RBC: 3.32 10*6/uL — AB (ref 3.70–5.45)
RDW: 21.3 % — AB (ref 11.2–14.5)
WBC: 31.2 10*3/uL — AB (ref 3.9–10.3)
lymph#: 1 10*3/uL (ref 0.9–3.3)

## 2013-10-26 LAB — MAGNESIUM (CC13): Magnesium: 2.3 mg/dl (ref 1.5–2.5)

## 2013-10-26 MED ORDER — FENTANYL 50 MCG/HR TD PT72: 50.0000 ug | MEDICATED_PATCH | TRANSDERMAL | Status: DC

## 2013-10-26 MED ORDER — OXYCODONE-ACETAMINOPHEN 5-325 MG PO TABS: 1.0000 | ORAL_TABLET | Freq: Four times a day (QID) | ORAL | Status: DC | PRN

## 2013-10-26 NOTE — Telephone Encounter (Signed)
Tonya Sullivan notified of pain med refill request

## 2013-10-26 NOTE — Telephone Encounter (Signed)
[phone]  Pt is needing a refill on fentaNYL (DURAGESIC - DOSED MCG/HR) 50 MCG/HR

## 2013-10-26 NOTE — Telephone Encounter (Signed)
Ok to refill??  Last office visit 10/13/2013.  Last refill 09/25/2013.

## 2013-10-26 NOTE — Telephone Encounter (Signed)
Okay to refill? 

## 2013-10-26 NOTE — Telephone Encounter (Signed)
Prescription sent to pharmacy.

## 2013-10-26 NOTE — Telephone Encounter (Signed)
Prescription printed and patient made aware to come to office to pick up.  

## 2013-10-26 NOTE — Progress Notes (Signed)
No images are attached to the encounter. No scans are attached to the encounter. No scans are attached to the encounter. [facility] OFFICE PROGRESS NOTE  Vic Blackbird, MD [address] [place] [address]  DIAGNOSIS: Small cell carcinoma of lung   Primary site: Lung (Right)   Staging method: AJCC 7th Edition   Clinical: Stage IIA (T1a, N1, M0) signed by Curt Bears, MD on 08/06/2013  2:02 PM   Summary: Stage IIA (T1a, N1, M0)  PRIOR THERAPY: None  CURRENT THERAPY:  Systemic chemotherapy with cisplatin 60 mg/m2 on day1, etoposide 120 mg/m2 on days 1, 2, and 3 with neulasta support on day 4. Status post 3 cycles.  INTERVAL HISTORY: Chynah A Loud 58 y.o. female returns for routine followup prior to her fourth cycle of chemotherapy.  She denied any active bleeding issues. She is very worn out and would like to postpone chemotherapy if possible. Since her last visit, her radiation has been stopped. She reports that she is now feeling better. She denied fever, chills, cough or hemoptysis. She has some shortness breath with exertion. She denied any current nausea or vomiting, diarrhea or constipation. She denied any significant weight loss or night sweats. She continues to have pain in her lower back and hips which she rates a 7/10. She remains on fentanyl patch. She is requesting a refill on her Percocet today.  MEDICAL HISTORY: Past Medical History  Diagnosis Date  . COPD (chronic obstructive pulmonary disease)   . Depression   . Chronic back pain     DDD, disc bulge, radiculopathy, spinal stenosis  . Hyperlipidemia   . Anxiety   . Bipolar disorder, unspecified   . Carotid artery calcification   . DDD (degenerative disc disease), lumbosacral   . Spinal stenosis, lumbar   . Autoimmune thyroiditis   . Essential hypertension, benign   . TIA (transient ischemic attack)     August 2014  . GERD (gastroesophageal reflux disease)   . Neuropathic pain   .  Addison's disease     On Prednisone  . History of pneumonia   . Achalasia   . Frequent falls   . DM (diabetes mellitus) with complications   . Hypothyroidism   . Melanoma   . Small cell carcinoma of lung 08/06/2013    ALLERGIES:  is allergic to codeine; cyclobenzaprine; darvocet; abilify; ace inhibitors; adhesive; gabapentin; iodine; levofloxacin; prednisone; sulfa antibiotics; and robaxin.  MEDICATIONS:  Current Outpatient Prescriptions  Medication Sig Dispense Refill  . albuterol (PROVENTIL HFA;VENTOLIN HFA) 108 (90 BASE) MCG/ACT inhaler Inhale 2 puffs into the lungs 3 (three) times daily as needed for wheezing or shortness of breath.      Marland Kitchen amitriptyline (ELAVIL) 100 MG tablet Take 1 tablet (100 mg total) by mouth at bedtime.  30 tablet  2  . clonazePAM (KLONOPIN) 1 MG tablet Take 1 tablet (1 mg total) by mouth 4 (four) times daily.  120 tablet  2  . dexlansoprazole (DEXILANT) 60 MG capsule Take 1 capsule (60 mg total) by mouth daily.  30 capsule  3  . divalproex (DEPAKOTE ER) 500 MG 24 hr tablet Take 1 tablet (500 mg total) by mouth daily.  30 tablet  2  . donepezil (ARICEPT) 5 MG tablet Take 1 tablet (5 mg total) by mouth at bedtime.  30 tablet  3  . DULoxetine (CYMBALTA) 60 MG capsule Take 1 capsule (60 mg total) by mouth every morning.  30 capsule  2  .  EPINEPHrine (EPIPEN) 0.3 mg/0.3 mL IJ SOAJ injection Inject 0.3 mLs (0.3 mg total) into the muscle once. PRN FOR SEVERE ALLERGIC REACTION  1 Device  6  . feeding supplement, ENSURE COMPLETE, (ENSURE COMPLETE) LIQD Take 237 mLs by mouth daily.  10 Bottle  0  . fentaNYL (DURAGESIC - DOSED MCG/HR) 50 MCG/HR Place 1 patch (50 mcg total) onto the skin every 3 (three) days.  10 patch  0  . levothyroxine (SYNTHROID, LEVOTHROID) 25 MCG tablet Take 25 mcg by mouth daily before breakfast.      . lidocaine (LIDODERM) 5 % Place 1 patch onto the skin daily. Remove & Discard patch within 12 hours or as directed by MD      . lidocaine-prilocaine  (EMLA) cream Apply 1 application topically as needed. Apply to port 1 hr before chemo  30 g  0  . ondansetron (ZOFRAN) 4 MG tablet Take 1 tablet (4 mg total) by mouth every 6 (six) hours as needed for refractory nausea / vomiting.  20 tablet  0  . oxyCODONE-acetaminophen (PERCOCET/ROXICET) 5-325 MG per tablet Take 1 tablet by mouth every 6 (six) hours as needed for severe pain.  30 tablet  0  . pantoprazole (PROTONIX) 40 MG tablet TAKE (1) TABLET BY MOUTH DAILY.  30 tablet  6  . pravastatin (PRAVACHOL) 40 MG tablet Take 40 mg by mouth daily.      . predniSONE (DELTASONE) 2.5 MG tablet Take 3 tablets (7.5 mg total) by mouth daily with breakfast.      . prochlorperazine (COMPAZINE) 10 MG tablet Take 1 tablet (10 mg total) by mouth every 6 (six) hours as needed for nausea or vomiting.  30 tablet  0  . Tiotropium Bromide Monohydrate (SPIRIVA RESPIMAT) 2.5 MCG/ACT AERS Inhale 2 puffs into the lungs daily.      . [DISCONTINUED] Gabapentin, PHN, (GRALISE STARTER) 300 & 600 MG MISC Take 300-1,800 mg by mouth as directed. 15 DAY Starter pack doses     DAY 1- 300 mg     DAY 2- 600 mg     DAY 3 to 6 - 900 mg    DAY 7 to 10 - 1200 mg     DAY 11 to 14 - 1500 mg      DAY 15 - 1800 mg        No current facility-administered medications for this visit.    SURGICAL HISTORY:  Past Surgical History  Procedure Laterality Date  . Appendectomy    . Cholecystectomy    . Lump left breast      Benign  . Abdominal hysterectomy      Tubal pregnancy  . Inguinal hernia repair    . Stomach surgery    . Ectopic pregnancy surgery    . Back surgery      x 5;1984;1989;;1999;2000;2010  . Colonoscopy  01/25/2012    Procedure: COLONOSCOPY;  Surgeon: Rogene Houston, MD;  Location: [facility];  Service: Endoscopy;  Laterality: N/A;  130  . Esophagogastroduodenoscopy (egd) with esophageal dilation N/A 08/13/2012    Procedure: ESOPHAGOGASTRODUODENOSCOPY (EGD) WITH ESOPHAGEAL DILATION;  Surgeon: Rogene Houston, MD;   Location: [facility];  Service: Endoscopy;  Laterality: N/A;  315  . Anterior cervical decomp/discectomy fusion N/A 10/21/2012    Procedure: Cervical Six-Seven Anterior cervical decompression/diskectomy/fusion;  Surgeon: Kristeen Miss, MD;  Location: [facility];  Service: Neurosurgery;  Laterality: N/A;  Cervical Six-Seven Anterior cervical decompression/diskectomy/fusion  . Cataract extraction w/phaco Right 11/25/2012  Procedure: CATARACT EXTRACTION PHACO AND INTRAOCULAR LENS PLACEMENT (IOC);  Surgeon: Elta Guadeloupe T. Gershon Crane, MD;  Location: [facility];  Service: Ophthalmology;  Laterality: Right;  CDE:10.06  . Cataract extraction w/phaco Left 12/09/2012    Procedure: CATARACT EXTRACTION PHACO AND INTRAOCULAR LENS PLACEMENT (IOC);  Surgeon: Elta Guadeloupe T. Gershon Crane, MD;  Location: [facility];  Service: Ophthalmology;  Laterality: Left;  CDE:5.06  . Tonsillectomy    . Hiatal hernia repair    . Heller myotomy N/A 03/17/2013    Procedure: DIAGNOSTIC LAPAROSCOPY, LAPAROSCOPIC HELLER MYOTOMY, ENDOSCOPY, DOR FUNDOPLICATION;  Surgeon: Ralene Ok, MD;  Location: [facility];  Service: General;  Laterality: N/A;  . Breast surgery      Left breast  . Eye surgery    . Video bronchoscopy with endobronchial ultrasound N/A 07/24/2013    Procedure: VIDEO BRONCHOSCOPY WITH ENDOBRONCHIAL ULTRASOUND;  Surgeon: [name], MD;  Location: [facility];  Service: Thoracic;  Laterality: N/A;    REVIEW OF SYSTEMS:  Review of Systems  Constitutional: Positive for malaise/fatigue. Negative for fever, chills, weight loss and diaphoresis.  HENT: Negative for congestion, ear discharge, ear pain, hearing loss, nosebleeds, sore throat and tinnitus.        Mid esophageal pain  Eyes: Negative for blurred vision, double vision, photophobia, pain, discharge and redness.  Respiratory: Positive for shortness of breath. Negative for cough, hemoptysis, sputum production, wheezing and stridor.   Cardiovascular: Negative for chest pain, palpitations,  orthopnea, claudication, leg swelling and PND.  Gastrointestinal: Negative for heartburn, nausea, vomiting, abdominal pain, diarrhea, constipation, blood in stool and melena.  Genitourinary: Negative.   Musculoskeletal: Negative.   Skin: Negative.   Neurological: Positive for weakness. Negative for dizziness, tingling, focal weakness, seizures and headaches.  Endo/Heme/Allergies: Does not bruise/bleed easily.  Psychiatric/Behavioral: Negative for depression. The patient is not nervous/anxious and does not have insomnia.      PHYSICAL EXAMINATION: Physical Exam  Constitutional: She is oriented to person, place, and time and well-developed, well-nourished, and in no distress.  HENT:  Head: Normocephalic and atraumatic.  Mouth/Throat: Oropharynx is clear and moist.  Eyes: Pupils are equal, round, and reactive to light.  Neck: Normal range of motion. Neck supple. No JVD present. No tracheal deviation present. No thyromegaly present.  Cardiovascular: Normal rate, regular rhythm, normal heart sounds and intact distal pulses.  Exam reveals no gallop and no friction rub.   No murmur heard. Pulmonary/Chest: Effort normal. No respiratory distress. She has no wheezes. She has no rales.  Abdominal: Soft. Bowel sounds are normal. She exhibits no distension and no mass. There is no tenderness.  Musculoskeletal: Normal range of motion. She exhibits no edema and no tenderness.  Lymphadenopathy:    She has no cervical adenopathy.  Neurological: She is alert and oriented to person, place, and time. She has normal reflexes. Gait normal.  Skin: Skin is warm and dry. No rash noted.    ECOG PERFORMANCE STATUS: 1 - Symptomatic but completely ambulatory  Blood pressure 108/71, pulse 103, temperature 99.5 F (37.5 C), temperature source Oral, resp. rate 18, height 5\' 7"  (1.702 m), weight 197 lb 1.6 oz (89.404 kg), SpO2 100.00%.  LABORATORY DATA: Lab Results  Component Value Date   WBC 31.2* 10/26/2013    HGB 10.2* 10/26/2013   HCT 32.0* 10/26/2013   MCV 96.6 10/26/2013   PLT 257 10/26/2013      Chemistry      Component Value Date/Time   NA 135* 10/26/2013 1419   NA 138 10/15/2013 1702  K 5.1 10/26/2013 1419   K 5.1 10/15/2013 1702   CL 98 10/15/2013 1702   CO2 26 10/26/2013 1419   CO2 28 10/15/2013 1702   BUN 7.2 10/26/2013 1419   BUN 17 10/15/2013 1702   CREATININE 0.9 10/26/2013 1419   CREATININE 0.81 10/15/2013 1702   CREATININE 0.86 07/15/2013 1311      Component Value Date/Time   CALCIUM 9.3 10/26/2013 1419   CALCIUM 9.2 10/15/2013 1702   ALKPHOS 147 10/26/2013 1419   ALKPHOS 85 09/15/2013 1856   AST 12 10/26/2013 1419   AST 10 09/15/2013 1856   ALT 9 10/26/2013 1419   ALT 8 09/15/2013 1856   BILITOT <0.20 10/26/2013 1419   BILITOT <0.2* 09/15/2013 1856       RADIOGRAPHIC STUDIES:  Dg Chest 2 View  08/20/2013   CLINICAL DATA:  Facial swelling.  Allergic reaction.  EXAM: CHEST  2 VIEW  COMPARISON:  PET CT scan 07/31/2013. Single view of the chest 07/24/2013. CT chest 07/14/2013.  FINDINGS: Small amount of fluid or thickening in the minor fissure is noted. Right hilar adenopathy is seen as on the prior examinations. There is some dependent atelectasis in the lung bases. No pneumothorax or pleural effusion. Right upper lobe pulmonary nodule is identified. Heart size is normal.  IMPRESSION: No acute abnormality.  Right upper lobe pulmonary nodule and right hilar lymphadenopathy are seen as on prior studies.   Electronically Signed   By: Inge Rise M.D.   On: 08/20/2013 07:21   Ct Angio Chest Pe W/cm &/or Wo Cm  08/20/2013   CLINICAL DATA:  Periorbital edema, lung cancer.  EXAM: CT ANGIOGRAPHY CHEST WITH CONTRAST  TECHNIQUE: Multidetector CT imaging of the chest was performed using the standard protocol during bolus administration of intravenous contrast. Multiplanar CT image reconstructions and MIPs were obtained to evaluate the vascular anatomy.  CONTRAST:  14mL  OMNIPAQUE IOHEXOL 350 MG/ML SOLN  COMPARISON:  CT scan of July 14, 2013.  FINDINGS: No pneumothorax or pleural effusion is noted. 11 mm nodule is noted in right upper lobe which is stable compared to prior exam. 40 x 35 mm right hilar mass is again noted consistent with malignancy. There does not appear to be significant compression or obstruction of the superior vena cava. There is no evidence of pulmonary embolus. Thoracic aorta appears normal without evidence of aneurysm or dissection. Visualized portion of upper abdomen appears normal. Mild subsegmental atelectasis of right middle lobe is noted.  Review of the MIP images confirms the above findings.  IMPRESSION: 4 cm right hilar mass is again noted with associated 11 mm nodule in right upper lobe consistent with metastatic disease. There does not appear to be significant compression of the superior vena cava by this mass. There is no evidence of pulmonary embolus. Mild subsegmental atelectasis of right middle lobe is noted.   Electronically Signed   By: Sabino Dick M.D.   On: 08/20/2013 16:45   Ir Fluoro Guide Cv Line Right  08/27/2013   CLINICAL DATA:  Lung carcinoma, needs venous access for chemotherapy.  EXAM: TUNNELED PORT CATHETER PLACEMENT WITH ULTRASOUND AND FLUOROSCOPIC GUIDANCE  FLUOROSCOPY TIME:  48 seconds  ANESTHESIA/SEDATION: Intravenous Fentanyl and Versed were administered as conscious sedation during continuous cardiorespiratory monitoring by the radiology RN, with a total moderate sedation time of 15 minutes.  TECHNIQUE: The procedure, risks, benefits, and alternatives were explained to the patient. Questions regarding the procedure were encouraged and answered. The patient understands and consents to  the procedure. As antibiotic prophylaxis, cefazolin 2 g was ordered pre-procedure and administered intravenously within one hour of incision. Patency of the right IJ vein was confirmed with ultrasound with image documentation. An appropriate  skin site was determined. Skin site was marked. Region was prepped using maximum barrier technique including cap and mask, sterile gown, sterile gloves, large sterile sheet, and Chlorhexidine as cutaneous antisepsis. The region was infiltrated locally with 1% lidocaine. Under real-time ultrasound guidance, the right IJ vein was accessed with a 21 gauge micropuncture needle; the needle tip within the vein was confirmed with ultrasound image documentation. Needle was exchanged over a 018 guidewire for transitional dilator which allowed passage of the [facility] wire into the IVC. Over this, the transitional dilator was exchanged for a 5 [place] MPA catheter. A small incision was made on the right anterior chest wall and a subcutaneous pocket fashioned. The power-injectable port was positioned and its catheter tunneled to the right IJ dermatotomy site. The MPA catheter was exchanged over an Amplatz wire for a peel-away sheath, through which the port catheter, which had been trimmed to the appropriate length, was advanced and positioned under fluoroscopy with its tip at the cavoatrial junction. Spot chest radiograph confirms good catheter position and no pneumothorax. The pocket was closed with deep interrupted and subcuticular continuous 3-0 Monocryl sutures. The port was flushed per protocol. The incisions were covered with Dermabond then covered with a sterile dressing. No immediate complication.  IMPRESSION: Technically successful right IJ power-injectable port catheter placement. Ready for routine use.   Electronically Signed   By: Arne Cleveland M.D.   On: 08/27/2013 12:37   Ir US Guide Vasc Access Right  08/27/2013   CLINICAL DATA:  Lung carcinoma, needs venous access for chemotherapy.  EXAM: TUNNELED PORT CATHETER PLACEMENT WITH ULTRASOUND AND FLUOROSCOPIC GUIDANCE  FLUOROSCOPY TIME:  48 seconds  ANESTHESIA/SEDATION: Intravenous Fentanyl and Versed were administered as conscious sedation during continuous  cardiorespiratory monitoring by the radiology RN, with a total moderate sedation time of 15 minutes.  TECHNIQUE: The procedure, risks, benefits, and alternatives were explained to the patient. Questions regarding the procedure were encouraged and answered. The patient understands and consents to the procedure. As antibiotic prophylaxis, cefazolin 2 g was ordered pre-procedure and administered intravenously within one hour of incision. Patency of the right IJ vein was confirmed with ultrasound with image documentation. An appropriate skin site was determined. Skin site was marked. Region was prepped using maximum barrier technique including cap and mask, sterile gown, sterile gloves, large sterile sheet, and Chlorhexidine as cutaneous antisepsis. The region was infiltrated locally with 1% lidocaine. Under real-time ultrasound guidance, the right IJ vein was accessed with a 21 gauge micropuncture needle; the needle tip within the vein was confirmed with ultrasound image documentation. Needle was exchanged over a 018 guidewire for transitional dilator which allowed passage of the [facility] wire into the IVC. Over this, the transitional dilator was exchanged for a 5 [place] MPA catheter. A small incision was made on the right anterior chest wall and a subcutaneous pocket fashioned. The power-injectable port was positioned and its catheter tunneled to the right IJ dermatotomy site. The MPA catheter was exchanged over an Amplatz wire for a peel-away sheath, through which the port catheter, which had been trimmed to the appropriate length, was advanced and positioned under fluoroscopy with its tip at the cavoatrial junction. Spot chest radiograph confirms good catheter position and no pneumothorax. The pocket was closed with deep interrupted and subcuticular continuous 3-0 Monocryl  sutures. The port was flushed per protocol. The incisions were covered with Dermabond then covered with a sterile dressing. No immediate  complication.  IMPRESSION: Technically successful right IJ power-injectable port catheter placement. Ready for routine use.   Electronically Signed   By: Arne Cleveland M.D.   On: 08/27/2013 12:37     ASSESSMENT/PLAN:  No problem-specific assessment & plan notes found for this encounter.  Ms. Allmendinger is a pleasant 58 year old female with limited stage small cell lung cancer. She is status post 3 cycles of chemotherapy with cisplatin and etoposide with neulasta support. She is feeling well today and her counts are adequate. She would like to proceed with her fourth cycle of chemotherapy which is scheduled to begin tomorrow 10/27/2013. Of note, the patient had a CT of the chest ordered which she has not yet had done. I will discuss this with Dr. Earlie Server upon his return to determine if he wishes to proceed with a CT at this point in time. The patient knows that we will contact her with an appointment if Dr. Earlie Server would like to proceed with a CT.  I have refilled her Percocet for her today.  All questions were answered. The patient knows to call the clinic with any problems, questions or concerns. We can certainly see the patient much sooner if necessary.     Mikey Bussing, NP 10/26/2013

## 2013-10-27 ENCOUNTER — Ambulatory Visit (HOSPITAL_BASED_OUTPATIENT_CLINIC_OR_DEPARTMENT_OTHER): Payer: Medicare Other

## 2013-10-27 ENCOUNTER — Telehealth: Payer: Self-pay | Admitting: Internal Medicine

## 2013-10-27 VITALS — BP 117/71 | HR 86 | Temp 98.6°F | Resp 22

## 2013-10-27 DIAGNOSIS — Z5111 Encounter for antineoplastic chemotherapy: Secondary | ICD-10-CM

## 2013-10-27 DIAGNOSIS — C3411 Malignant neoplasm of upper lobe, right bronchus or lung: Secondary | ICD-10-CM

## 2013-10-27 DIAGNOSIS — C3491 Malignant neoplasm of unspecified part of right bronchus or lung: Secondary | ICD-10-CM

## 2013-10-27 MED ORDER — SODIUM CHLORIDE 0.9 % IV SOLN
100.0000 mg/m2 | Freq: Once | INTRAVENOUS | Status: AC
  Administered 2013-10-27: 210 mg via INTRAVENOUS
  Filled 2013-10-27: qty 10.5

## 2013-10-27 MED ORDER — DEXAMETHASONE SODIUM PHOSPHATE 20 MG/5ML IJ SOLN
12.0000 mg | Freq: Once | INTRAMUSCULAR | Status: AC
  Administered 2013-10-27: 12 mg via INTRAVENOUS

## 2013-10-27 MED ORDER — POTASSIUM CHLORIDE 2 MEQ/ML IV SOLN
Freq: Once | INTRAVENOUS | Status: AC
  Administered 2013-10-27: 10:00:00 via INTRAVENOUS
  Filled 2013-10-27: qty 10

## 2013-10-27 MED ORDER — SODIUM CHLORIDE 0.9 % IJ SOLN
10.0000 mL | INTRAMUSCULAR | Status: DC | PRN
  Filled 2013-10-27: qty 10

## 2013-10-27 MED ORDER — SODIUM CHLORIDE 0.9 % IV SOLN
Freq: Once | INTRAVENOUS | Status: AC
  Administered 2013-10-27: 10:00:00 via INTRAVENOUS

## 2013-10-27 MED ORDER — SODIUM CHLORIDE 0.9 % IV SOLN
150.0000 mg | Freq: Once | INTRAVENOUS | Status: AC
  Administered 2013-10-27: 150 mg via INTRAVENOUS
  Filled 2013-10-27: qty 5

## 2013-10-27 MED ORDER — PALONOSETRON HCL INJECTION 0.25 MG/5ML
INTRAVENOUS | Status: AC
  Filled 2013-10-27: qty 5

## 2013-10-27 MED ORDER — HEPARIN SOD (PORK) LOCK FLUSH 100 UNIT/ML IV SOLN
500.0000 [IU] | Freq: Once | INTRAVENOUS | Status: DC | PRN
  Filled 2013-10-27: qty 5

## 2013-10-27 MED ORDER — DEXAMETHASONE SODIUM PHOSPHATE 20 MG/5ML IJ SOLN
INTRAMUSCULAR | Status: AC
  Filled 2013-10-27: qty 5

## 2013-10-27 MED ORDER — SODIUM CHLORIDE 0.9 % IV SOLN
60.0000 mg/m2 | Freq: Once | INTRAVENOUS | Status: AC
  Administered 2013-10-27: 123 mg via INTRAVENOUS
  Filled 2013-10-27: qty 123

## 2013-10-27 MED ORDER — PALONOSETRON HCL INJECTION 0.25 MG/5ML
0.2500 mg | Freq: Once | INTRAVENOUS | Status: AC
  Administered 2013-10-27: 0.25 mg via INTRAVENOUS

## 2013-10-27 NOTE — Telephone Encounter (Signed)
Received PA determination.   PA approved.

## 2013-10-27 NOTE — Patient Instructions (Signed)
[facility] Discharge Instructions for Patients Receiving Chemotherapy  Today you received the following chemotherapy agents :  Cisplatin & VP-16  To help prevent nausea and vomiting after your treatment, we encourage you to take your nausea medication.   If you develop nausea and vomiting that is not controlled by your nausea medication, call the clinic.   BELOW ARE SYMPTOMS THAT SHOULD BE REPORTED IMMEDIATELY:  *FEVER GREATER THAN 100.5 F  *CHILLS WITH OR WITHOUT FEVER  NAUSEA AND VOMITING THAT IS NOT CONTROLLED WITH YOUR NAUSEA MEDICATION  *UNUSUAL SHORTNESS OF BREATH  *UNUSUAL BRUISING OR BLEEDING  TENDERNESS IN MOUTH AND THROAT WITH OR WITHOUT PRESENCE OF ULCERS  *URINARY PROBLEMS  *BOWEL PROBLEMS  UNUSUAL RASH Items with * indicate a potential emergency and should be followed up as soon as possible.  Feel free to call the clinic you have any questions or concerns. The clinic phone number is [phone].

## 2013-10-27 NOTE — Telephone Encounter (Signed)
added pt appts....pt will get new sched at todays visit

## 2013-10-28 ENCOUNTER — Other Ambulatory Visit: Payer: Self-pay | Admitting: Internal Medicine

## 2013-10-28 ENCOUNTER — Ambulatory Visit (HOSPITAL_BASED_OUTPATIENT_CLINIC_OR_DEPARTMENT_OTHER): Payer: Medicare Other

## 2013-10-28 ENCOUNTER — Ambulatory Visit (HOSPITAL_BASED_OUTPATIENT_CLINIC_OR_DEPARTMENT_OTHER): Payer: Medicare Other | Admitting: Nurse Practitioner

## 2013-10-28 VITALS — BP 130/74 | HR 84 | Temp 98.4°F | Resp 18

## 2013-10-28 DIAGNOSIS — Z5111 Encounter for antineoplastic chemotherapy: Secondary | ICD-10-CM | POA: Diagnosis present

## 2013-10-28 DIAGNOSIS — C3491 Malignant neoplasm of unspecified part of right bronchus or lung: Secondary | ICD-10-CM

## 2013-10-28 DIAGNOSIS — C3411 Malignant neoplasm of upper lobe, right bronchus or lung: Secondary | ICD-10-CM

## 2013-10-28 DIAGNOSIS — C3490 Malignant neoplasm of unspecified part of unspecified bronchus or lung: Secondary | ICD-10-CM

## 2013-10-28 DIAGNOSIS — T7840XA Allergy, unspecified, initial encounter: Secondary | ICD-10-CM

## 2013-10-28 MED ORDER — DIPHENHYDRAMINE HCL 50 MG/ML IJ SOLN
25.0000 mg | Freq: Once | INTRAMUSCULAR | Status: AC
  Administered 2013-10-28: 25 mg via INTRAVENOUS

## 2013-10-28 MED ORDER — PROCHLORPERAZINE EDISYLATE 5 MG/ML IJ SOLN
INTRAMUSCULAR | Status: AC
  Filled 2013-10-28: qty 2

## 2013-10-28 MED ORDER — FAMOTIDINE IN NACL 20-0.9 MG/50ML-% IV SOLN
20.0000 mg | Freq: Once | INTRAVENOUS | Status: AC
  Administered 2013-10-28: 20 mg via INTRAVENOUS

## 2013-10-28 MED ORDER — SODIUM CHLORIDE 0.9 % IV SOLN
Freq: Once | INTRAVENOUS | Status: AC
  Administered 2013-10-28: 12:00:00 via INTRAVENOUS

## 2013-10-28 MED ORDER — PROCHLORPERAZINE EDISYLATE 5 MG/ML IJ SOLN
10.0000 mg | Freq: Once | INTRAMUSCULAR | Status: AC
  Administered 2013-10-28: 10 mg via INTRAVENOUS

## 2013-10-28 MED ORDER — SODIUM CHLORIDE 0.9 % IV SOLN
100.0000 mg/m2 | Freq: Once | INTRAVENOUS | Status: AC
  Administered 2013-10-28: 210 mg via INTRAVENOUS
  Filled 2013-10-28: qty 10.5

## 2013-10-28 MED ORDER — SODIUM CHLORIDE 0.9 % IJ SOLN
10.0000 mL | INTRAMUSCULAR | Status: DC | PRN
  Administered 2013-10-28: 10 mL
  Filled 2013-10-28: qty 10

## 2013-10-28 MED ORDER — HEPARIN SOD (PORK) LOCK FLUSH 100 UNIT/ML IV SOLN
500.0000 [IU] | Freq: Once | INTRAVENOUS | Status: AC | PRN
  Administered 2013-10-28: 500 [IU]
  Filled 2013-10-28: qty 5

## 2013-10-28 MED ORDER — DIPHENHYDRAMINE HCL 50 MG/ML IJ SOLN
INTRAMUSCULAR | Status: AC
  Filled 2013-10-28: qty 1

## 2013-10-28 MED ORDER — FAMOTIDINE IN NACL 20-0.9 MG/50ML-% IV SOLN
INTRAVENOUS | Status: AC
  Filled 2013-10-28: qty 50

## 2013-10-28 NOTE — Patient Instructions (Signed)
[facility] Discharge Instructions for Patients Receiving Chemotherapy  Today you received the following chemotherapy agents: Etoposide.  To help prevent nausea and vomiting after your treatment, we encourage you to take your nausea medication as prescribed.   If you develop nausea and vomiting that is not controlled by your nausea medication, call the clinic.   BELOW ARE SYMPTOMS THAT SHOULD BE REPORTED IMMEDIATELY:  *FEVER GREATER THAN 100.5 F  *CHILLS WITH OR WITHOUT FEVER  NAUSEA AND VOMITING THAT IS NOT CONTROLLED WITH YOUR NAUSEA MEDICATION  *UNUSUAL SHORTNESS OF BREATH  *UNUSUAL BRUISING OR BLEEDING  TENDERNESS IN MOUTH AND THROAT WITH OR WITHOUT PRESENCE OF ULCERS  *URINARY PROBLEMS  *BOWEL PROBLEMS  UNUSUAL RASH Items with * indicate a potential emergency and should be followed up as soon as possible.  Feel free to call the clinic you have any questions or concerns. The clinic phone number is [phone].

## 2013-10-28 NOTE — Progress Notes (Signed)
1145 Pt arrived today with c/o of redness/swelling in face; itchy throat; severe fatigue.  Pt request to see Retta Mac, NP regarding symptoms.   Ewing, NP in to see pt and new orders received for new pre-meds, compazine, Pepcid and benadryl and discontinued decadron from treatment. 1345 Pt states she is feeling better; swelling in face and redness diminished.  Per Retta Mac, NP proceed with treatment.

## 2013-10-29 ENCOUNTER — Telehealth: Payer: Self-pay | Admitting: *Deleted

## 2013-10-29 ENCOUNTER — Ambulatory Visit (HOSPITAL_BASED_OUTPATIENT_CLINIC_OR_DEPARTMENT_OTHER): Payer: Medicare Other

## 2013-10-29 ENCOUNTER — Other Ambulatory Visit: Payer: Self-pay | Admitting: *Deleted

## 2013-10-29 ENCOUNTER — Encounter: Payer: Self-pay | Admitting: *Deleted

## 2013-10-29 ENCOUNTER — Other Ambulatory Visit: Payer: Self-pay | Admitting: Nurse Practitioner

## 2013-10-29 ENCOUNTER — Telehealth: Payer: Self-pay | Admitting: Medical Oncology

## 2013-10-29 ENCOUNTER — Encounter: Payer: Self-pay | Admitting: Nurse Practitioner

## 2013-10-29 VITALS — BP 106/62 | HR 88 | Temp 98.2°F

## 2013-10-29 DIAGNOSIS — Z5111 Encounter for antineoplastic chemotherapy: Secondary | ICD-10-CM

## 2013-10-29 DIAGNOSIS — C3490 Malignant neoplasm of unspecified part of unspecified bronchus or lung: Secondary | ICD-10-CM

## 2013-10-29 DIAGNOSIS — C3411 Malignant neoplasm of upper lobe, right bronchus or lung: Secondary | ICD-10-CM

## 2013-10-29 DIAGNOSIS — C3491 Malignant neoplasm of unspecified part of right bronchus or lung: Secondary | ICD-10-CM

## 2013-10-29 MED ORDER — ONDANSETRON HCL 8 MG PO TABS
8.0000 mg | ORAL_TABLET | Freq: Once | ORAL | Status: AC
  Administered 2013-10-29: 8 mg via ORAL

## 2013-10-29 MED ORDER — DIPHENHYDRAMINE HCL 50 MG/ML IJ SOLN
INTRAMUSCULAR | Status: AC
  Filled 2013-10-29: qty 1

## 2013-10-29 MED ORDER — SODIUM CHLORIDE 0.9 % IV SOLN
Freq: Once | INTRAVENOUS | Status: AC
  Administered 2013-10-29: 15:00:00 via INTRAVENOUS

## 2013-10-29 MED ORDER — PROCHLORPERAZINE MALEATE 10 MG PO TABS: 10.0000 mg | ORAL_TABLET | Freq: Four times a day (QID) | ORAL | Status: DC | PRN

## 2013-10-29 MED ORDER — PROCHLORPERAZINE EDISYLATE 5 MG/ML IJ SOLN
INTRAMUSCULAR | Status: AC
  Filled 2013-10-29: qty 2

## 2013-10-29 MED ORDER — FAMOTIDINE IN NACL 20-0.9 MG/50ML-% IV SOLN
INTRAVENOUS | Status: AC
  Filled 2013-10-29: qty 50

## 2013-10-29 MED ORDER — HEPARIN SOD (PORK) LOCK FLUSH 100 UNIT/ML IV SOLN
500.0000 [IU] | Freq: Once | INTRAVENOUS | Status: AC | PRN
  Administered 2013-10-29: 500 [IU]
  Filled 2013-10-29: qty 5

## 2013-10-29 MED ORDER — PROCHLORPERAZINE EDISYLATE 5 MG/ML IJ SOLN
10.0000 mg | Freq: Four times a day (QID) | INTRAMUSCULAR | Status: DC | PRN
  Administered 2013-10-29: 10 mg via INTRAVENOUS

## 2013-10-29 MED ORDER — DIPHENHYDRAMINE HCL 50 MG/ML IJ SOLN
25.0000 mg | Freq: Once | INTRAMUSCULAR | Status: AC
  Administered 2013-10-29: 25 mg via INTRAVENOUS

## 2013-10-29 MED ORDER — SODIUM CHLORIDE 0.9 % IJ SOLN
10.0000 mL | INTRAMUSCULAR | Status: DC | PRN
  Administered 2013-10-29: 10 mL
  Filled 2013-10-29: qty 10

## 2013-10-29 MED ORDER — FAMOTIDINE IN NACL 20-0.9 MG/50ML-% IV SOLN
20.0000 mg | Freq: Two times a day (BID) | INTRAVENOUS | Status: DC
  Administered 2013-10-29: 20 mg via INTRAVENOUS

## 2013-10-29 MED ORDER — SODIUM CHLORIDE 0.9 % IV SOLN
100.0000 mg/m2 | Freq: Once | INTRAVENOUS | Status: AC
  Administered 2013-10-29: 210 mg via INTRAVENOUS
  Filled 2013-10-29: qty 10.5

## 2013-10-29 MED ORDER — ONDANSETRON HCL 8 MG PO TABS
ORAL_TABLET | ORAL | Status: AC
  Filled 2013-10-29: qty 1

## 2013-10-29 NOTE — Telephone Encounter (Signed)
Pt seen by Selena Lesser

## 2013-10-29 NOTE — Progress Notes (Signed)
[facility] Psychosocial Distress Screening Clinical Social Work  Clinical Social Work was referred by distress screening protocol.  The patient scored a 7 on the Psychosocial Distress Thermometer which indicates moderate distress. Clinical Social Worker met with patient in infusion room to assess for distress and other psychosocial needs. Tonya Sullivan shared she is feeling overwhelmed with chemotherapy due to the reactions and has requested a scan to determine next appropriate steps.  She shared she has stopped radiation treatment.  Tonya Sullivan is still enrolled in [facility] and shared their services continue to be very helpful.  She recently made a trip to [place] to visit family and her family plans to visit her for Thanksgiving.    ONCBCN DISTRESS SCREENING 10/22/2013  Screening Type Change in Status  Mark the number that describes how much distress you have been experiencing in the past week 7  Practical problem type   Family Problem type   Emotional problem type   Spiritual/Religous concerns type   Information Concerns Type (No Data)  Physical Problem type Bathing/dressing;Getting around  Physician notified of physical symptoms Yes  Referral to clinical psychology Yes  Referral to clinical social work     Clinical Social Worker follow up needed: No.  If yes, follow up plan:  CSW encouraged patient to call her as needed.  Polo Riley, MSW, LCSW, OSW-C Clinical Social Worker [facility] [phone]

## 2013-10-29 NOTE — Assessment & Plan Note (Signed)
Patient initiated cycle 4 of her cisplatin/etoposide therapy regimen just yesterday 10/27/2013.  She has plans to return tomorrow for cycle 4, day 3 of the etoposide only portion of her chemotherapy regimen.  She will return on 11/17/2013 for her next cycle of chemotherapy.  She will also continue with weekly labs.  Also, patient has missed her restaging chest CT a few different times to transportation issues.  Advised patient I will confirm a rescheduling of the restaging chest CT with contrast and let her know of new appointment time.

## 2013-10-29 NOTE — Progress Notes (Signed)
Will order a restaging chest CT with contrast for next week.  Patient is aware of plan.  Schedule will call patient with CT appointment.

## 2013-10-29 NOTE — Patient Instructions (Signed)
[facility] Discharge Instructions for Patients Receiving Chemotherapy  Today you received the following chemotherapy agents etoposide.    To help prevent nausea and vomiting after your treatment, we encourage you to take your nausea medication as directed   If you develop nausea and vomiting that is not controlled by your nausea medication, call the clinic.   BELOW ARE SYMPTOMS THAT SHOULD BE REPORTED IMMEDIATELY:  *FEVER GREATER THAN 100.5 F  *CHILLS WITH OR WITHOUT FEVER  NAUSEA AND VOMITING THAT IS NOT CONTROLLED WITH YOUR NAUSEA MEDICATION  *UNUSUAL SHORTNESS OF BREATH  *UNUSUAL BRUISING OR BLEEDING  TENDERNESS IN MOUTH AND THROAT WITH OR WITHOUT PRESENCE OF ULCERS  *URINARY PROBLEMS  *BOWEL PROBLEMS  UNUSUAL RASH Items with * indicate a potential emergency and should be followed up as soon as possible.  Feel free to call the clinic you have any questions or concerns. The clinic phone number is [phone].

## 2013-10-29 NOTE — Telephone Encounter (Signed)
Reports her eyes are puffier , "swollen  more so today than yesterday'. She also states she has nausea. She denies itching, trouble breathing. She took benadryl last night. She took Phenergan this am. She is coming for treatment today.

## 2013-10-29 NOTE — Progress Notes (Signed)
SYMPTOM MANAGEMENT CLINIC   HPI: Tonya Sullivan 58 y.o. female diagnosed with lung cancer.  Patient is currently undergoing cisplatin/etoposide chemotherapy regimen.  Patient initiated cycle 4 of the cisplatin/etoposide chemotherapy just yesterday 10/27/2013.  Patient awoke this morning with facial flushing and edema.  Patient states that she typically experiences these symptoms when she has been given dexamethasone.  Patient states that she takes prednisone 7.5 mg on a daily basis for her previously diagnosed adrenal issues; but any higher dose steroids calls a hypersensitivity reaction.  Patient continues to deny any swallowing issues or airway complaints.  Hypersensitivity Reaction    CURRENT THERAPY: Upcoming Treatment Dates - LUNG SMALL CELL - LIMITED STAGE Cisplatin D1 / Etoposide D1-3 q21d Days with orders from any treatment category:  10/29/2013      SCHEDULING COMMUNICATION      prochlorperazine (COMPAZINE) injection 10 mg      etoposide (VEPESID) 210 mg in sodium chloride 0.9 % 600 mL chemo infusion      sodium chloride 0.9 % injection 10 mL      heparin lock flush 100 unit/mL      heparin lock flush 100 unit/mL      alteplase (CATHFLO ACTIVASE) injection 2 mg      sodium chloride 0.9 % injection 3 mL      Hot Pack 1 packet      0.9 %  sodium chloride infusion 10/30/2013      SCHEDULING COMMUNICATION INJECTION      prochlorperazine (COMPAZINE) injection 10 mg      pegfilgrastim (NEULASTA) injection 6 mg 11/17/2013      SCHEDULING COMMUNICATION      prochlorperazine (COMPAZINE) injection 10 mg      palonosetron (ALOXI) injection 0.25 mg      fosaprepitant (EMEND) 150 mg in sodium chloride 0.9 % 145 mL IVPB      CISplatin (PLATINOL) 123 mg in sodium chloride 0.9 % 500 mL chemo infusion      etoposide (VEPESID) 210 mg in sodium chloride 0.9 % 600 mL chemo infusion      sodium chloride 0.9 % injection 10 mL      heparin lock flush 100 unit/mL      heparin lock flush  100 unit/mL      alteplase (CATHFLO ACTIVASE) injection 2 mg      sodium chloride 0.9 % injection 3 mL      Hot Pack 1 packet      0.9 %  sodium chloride infusion      dextrose 5 % and 0.45% NaCl 1,000 mL with potassium chloride 20 mEq, magnesium sulfate 12 mEq, mannitol 12.5 g infusion      TREATMENT CONDITIONS    ROS  Past Medical History  Diagnosis Date  . COPD (chronic obstructive pulmonary disease)   . Depression   . Chronic back pain     DDD, disc bulge, radiculopathy, spinal stenosis  . Hyperlipidemia   . Anxiety   . Bipolar disorder, unspecified   . Carotid artery calcification   . DDD (degenerative disc disease), lumbosacral   . Spinal stenosis, lumbar   . Autoimmune thyroiditis   . Essential hypertension, benign   . TIA (transient ischemic attack)     August 2014  . GERD (gastroesophageal reflux disease)   . Neuropathic pain   . Addison's disease     On Prednisone  . History of pneumonia   . Achalasia   . Frequent falls   . DM (diabetes  mellitus) with complications   . Hypothyroidism   . Melanoma   . 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 Appendectomy    . Cholecystectomy    . Lump left breast      Benign  . Abdominal hysterectomy      Tubal pregnancy  . Inguinal hernia repair    . Stomach surgery    . Ectopic pregnancy surgery    . Back surgery      x 5;1984;1989;;1999;2000;2010  . Colonoscopy  01/25/2012    Procedure: COLONOSCOPY;  Surgeon: Rogene Houston, MD;  Location: [facility];  Service: Endoscopy;  Laterality: N/A;  130  . Esophagogastroduodenoscopy (egd) with esophageal dilation N/A 08/13/2012    Procedure: ESOPHAGOGASTRODUODENOSCOPY (EGD) WITH ESOPHAGEAL DILATION;  Surgeon: Rogene Houston, MD;  Location: [facility];  Service: Endoscopy;  Laterality: N/A;  315  . Anterior cervical decomp/discectomy fusion N/A 10/21/2012    Procedure: Cervical Six-Seven Anterior cervical  decompression/diskectomy/fusion;  Surgeon: Kristeen Miss, MD;  Location: [facility];  Service: Neurosurgery;  Laterality: N/A;  Cervical Six-Seven Anterior cervical decompression/diskectomy/fusion  . Cataract extraction w/phaco Right 11/25/2012    Procedure: CATARACT EXTRACTION PHACO AND INTRAOCULAR LENS PLACEMENT (IOC);  Surgeon: Elta Guadeloupe T. Gershon Crane, MD;  Location: [facility];  Service: Ophthalmology;  Laterality: Right;  CDE:10.06  . Cataract extraction w/phaco Left 12/09/2012    Procedure: CATARACT EXTRACTION PHACO AND INTRAOCULAR LENS PLACEMENT (IOC);  Surgeon: Elta Guadeloupe T. Gershon Crane, MD;  Location: [facility];  Service: Ophthalmology;  Laterality: Left;  CDE:5.06  . Tonsillectomy    . Hiatal hernia repair    . Heller myotomy N/A 03/17/2013    Procedure: DIAGNOSTIC LAPAROSCOPY, LAPAROSCOPIC HELLER MYOTOMY, ENDOSCOPY, DOR FUNDOPLICATION;  Surgeon: Ralene Ok, MD;  Location: [facility];  Service: General;  Laterality: N/A;  . Breast surgery      Left breast  . Eye surgery    . Video bronchoscopy with endobronchial ultrasound N/A 07/24/2013    Procedure: VIDEO BRONCHOSCOPY WITH ENDOBRONCHIAL ULTRASOUND;  Surgeon: [name], MD;  Location: [facility];  Service: Thoracic;  Laterality: N/A;    has Hyperlipidemia; Depression; Chronic pain; COPD (chronic obstructive pulmonary disease); Tobacco use; Chronic back pain; Bipolar disorder; Carotid stenosis; Essential hypertension; Mole of skin; Fall; Hoarse voice quality; Spontaneous ecchymoses; TIA (transient ischemic attack); Dysarthria; Stricture and stenosis of esophagus; Dysphagia; Peripheral edema; Urinary incontinence; Adrenal cortical insufficiency; Cervical spondylosis without myelopathy; Gait disturbance; Chalasia of lower esophageal sphincter; MDD (major depressive disorder), recurrent severe, without psychosis; PTSD (post-traumatic stress disorder); Unspecified episodic mood disorder; Memory problem; Lung mass; GERD (gastroesophageal reflux disease); Small cell  carcinoma of lung; Elevated troponin; Anemia, unspecified; Obesity, morbid; HTN (hypertension); Hypothyroidism; Unspecified protein-calorie malnutrition; Low BP; Fatigue; Decreased mobility; Physical deconditioning; Hypoalbuminemia; Hypersensitivity reaction; Orthostatic hypotension; Dehydration; Hyperkalemia; and Thrombocytopenia on her problem list.     is allergic to codeine; cyclobenzaprine; darvocet; abilify; ace inhibitors; adhesive; gabapentin; iodine; levofloxacin; prednisone; sulfa antibiotics; and robaxin.    Medication List       This list is accurate as of: 10/28/13 11:59 PM.  Always use your most recent med list.               albuterol 108 (90 BASE) MCG/ACT inhaler  Commonly known as:  PROVENTIL HFA;VENTOLIN HFA  Inhale 2 puffs into the lungs 3 (three) times daily as needed for wheezing or shortness of breath.     amitriptyline 100 MG tablet  Commonly  known as:  ELAVIL  Take 1 tablet (100 mg total) by mouth at bedtime.     clonazePAM 1 MG tablet  Commonly known as:  KLONOPIN  Take 1 tablet (1 mg total) by mouth 4 (four) times daily.     dexlansoprazole 60 MG capsule  Commonly known as:  DEXILANT  Take 1 capsule (60 mg total) by mouth daily.     divalproex 500 MG 24 hr tablet  Commonly known as:  DEPAKOTE ER  Take 1 tablet (500 mg total) by mouth daily.     donepezil 5 MG tablet  Commonly known as:  ARICEPT  Take 1 tablet (5 mg total) by mouth at bedtime.     DULoxetine 60 MG capsule  Commonly known as:  CYMBALTA  Take 1 capsule (60 mg total) by mouth every morning.     EPINEPHrine 0.3 mg/0.3 mL Soaj injection  Commonly known as:  EPIPEN  Inject 0.3 mLs (0.3 mg total) into the muscle once. PRN FOR SEVERE ALLERGIC REACTION     feeding supplement (ENSURE COMPLETE) Liqd  Take 237 mLs by mouth daily.     fentaNYL 50 MCG/HR  Commonly known as:  DURAGESIC - dosed mcg/hr  Place 1 patch (50 mcg total) onto the skin every 3 (three) days.     levothyroxine 25  MCG tablet  Commonly known as:  SYNTHROID, LEVOTHROID  Take 25 mcg by mouth daily before breakfast.     lidocaine 5 %  Commonly known as:  LIDODERM  Place 1 patch onto the skin daily. Remove & Discard patch within 12 hours or as directed by MD     lidocaine-prilocaine cream  Commonly known as:  EMLA  Apply 1 application topically as needed. Apply to port 1 hr before chemo     ondansetron 4 MG tablet  Commonly known as:  ZOFRAN  Take 1 tablet (4 mg total) by mouth every 6 (six) hours as needed for refractory nausea / vomiting.     oxyCODONE-acetaminophen 5-325 MG per tablet  Commonly known as:  PERCOCET/ROXICET  Take 1 tablet by mouth every 6 (six) hours as needed for severe pain.     pantoprazole 40 MG tablet  Commonly known as:  PROTONIX  TAKE (1) TABLET BY MOUTH DAILY.     pravastatin 40 MG tablet  Commonly known as:  PRAVACHOL  Take 40 mg by mouth daily.     predniSONE 2.5 MG tablet  Commonly known as:  DELTASONE  Take 3 tablets (7.5 mg total) by mouth daily with breakfast.     prochlorperazine 10 MG tablet  Commonly known as:  COMPAZINE  Take 1 tablet (10 mg total) by mouth every 6 (six) hours as needed for nausea or vomiting.     SPIRIVA RESPIMAT 2.5 MCG/ACT Aers  Generic drug:  Tiotropium Bromide Monohydrate  Inhale 2 puffs into the lungs daily.         PHYSICAL EXAMINATION  Vitals: BP: 117/71, HR 86, temp 98.6, sat 97  Physical Exam  Nursing note and vitals reviewed. Constitutional: She is oriented to person, place, and time and well-developed, well-nourished, and in no distress.  HENT:  Head: Normocephalic and atraumatic.  Mouth/Throat: Oropharynx is clear and moist.  Eyes: Conjunctivae and EOM are normal. Pupils are equal, round, and reactive to light. No scleral icterus.  Neck: Normal range of motion. Neck supple. No JVD present. No tracheal deviation present. No thyromegaly present.  Cardiovascular: Normal rate, regular rhythm, normal heart sounds and  intact distal pulses.   Pulmonary/Chest: Effort normal and breath sounds normal. No respiratory distress. She has no wheezes. She has no rales.  Abdominal: Soft. Bowel sounds are normal. She exhibits no distension. There is no tenderness. There is no rebound.  Musculoskeletal: Normal range of motion. She exhibits edema. She exhibits no tenderness.  Patient with some mild facial flushing and trace facial edema; with a specific periorbital edema bilaterally noted.  All symptoms did resolve with administration of Benadryl and Pepcid IV.  Lymphadenopathy:    She has no cervical adenopathy.  Neurological: She is alert and oriented to person, place, and time. Gait normal.  Skin: Skin is warm and dry. No rash noted. There is erythema.  Psychiatric: Affect normal.    LABORATORY DATA:. Appointment on 10/26/2013  Component Date Value Ref Range Status  . WBC 10/26/2013 31.2* 3.9 - 10.3 10e3/uL Final  . NEUT# 10/26/2013 28.7* 1.5 - 6.5 10e3/uL Final  . HGB 10/26/2013 10.2* 11.6 - 15.9 g/dL Final  . HCT 10/26/2013 32.0* 34.8 - 46.6 % Final  . Platelets 10/26/2013 257  145 - 400 10e3/uL Final  . MCV 10/26/2013 96.6  79.5 - 101.0 fL Final  . MCH 10/26/2013 30.7  25.1 - 34.0 pg Final  . MCHC 10/26/2013 31.7  31.5 - 36.0 g/dL Final  . RBC 10/26/2013 3.32* 3.70 - 5.45 10e6/uL Final  . RDW 10/26/2013 21.3* 11.2 - 14.5 % Final  . lymph# 10/26/2013 1.0  0.9 - 3.3 10e3/uL Final  . MONO# 10/26/2013 1.3* 0.1 - 0.9 10e3/uL Final  . Eosinophils Absolute 10/26/2013 0.1  0.0 - 0.5 10e3/uL Final  . Basophils Absolute 10/26/2013 0.1  0.0 - 0.1 10e3/uL Final  . NEUT% 10/26/2013 92.1* 38.4 - 76.8 % Final  . LYMPH% 10/26/2013 3.1* 14.0 - 49.7 % Final  . MONO% 10/26/2013 4.3  0.0 - 14.0 % Final  . EOS% 10/26/2013 0.2  0.0 - 7.0 % Final  . BASO% 10/26/2013 0.3  0.0 - 2.0 % Final  . Sodium 10/26/2013 135* 136 - 145 mEq/L Final  . Potassium 10/26/2013 5.1  3.5 - 5.1 mEq/L Final  . Chloride 10/26/2013 102  98 - 109  mEq/L Final  . CO2 10/26/2013 26  22 - 29 mEq/L Final  . Glucose 10/26/2013 110  70 - 140 mg/dl Final  . BUN 10/26/2013 7.2  7.0 - 26.0 mg/dL Final  . Creatinine 10/26/2013 0.9  0.6 - 1.1 mg/dL Final  . Total Bilirubin 10/26/2013 <0.20  0.20 - 1.20 mg/dL Final  . Alkaline Phosphatase 10/26/2013 147  40 - 150 U/L Final  . AST 10/26/2013 12  5 - 34 U/L Final  . ALT 10/26/2013 9  0 - 55 U/L Final  . Total Protein 10/26/2013 6.8  6.4 - 8.3 g/dL Final  . Albumin 10/26/2013 3.5  3.5 - 5.0 g/dL Final  . Calcium 10/26/2013 9.3  8.4 - 10.4 mg/dL Final  . Anion Gap 10/26/2013 7  3 - 11 mEq/L Final  . Magnesium 10/26/2013 2.3  1.5 - 2.5 mg/dl Final     RADIOGRAPHIC STUDIES: No results found.  ASSESSMENT/PLAN:    Small cell carcinoma of lung  Assessment & Plan Patient initiated cycle 4 of her cisplatin/etoposide therapy regimen just yesterday 10/27/2013.  She has plans to return tomorrow for cycle 4, day 3 of the etoposide only portion of her chemotherapy regimen.  She will return on 11/17/2013 for her next cycle of chemotherapy.  She will also continue with weekly labs.  Also, patient has missed her restaging chest CT a few different times to transportation issues.  Advised patient I will confirm a rescheduling of the restaging chest CT with contrast and let her know of new appointment time.   Hypersensitivity reaction  Assessment & Plan Patient initiated cycle 4 of her cisplatin/etoposide therapy regimen just yesterday 10/27/2013.  Patient noted facial flushing and edema when she awoke this morning.  She denies any swallowing difficulty or airway issues. Most likely, this is related to the dexamethasone premedication she was given  prior to her chemotherapy yesterday.  Patient states that she typically develops this reaction when she receives high-dose steroids.  Patient takes prednisone 7.5 mg on a daily basis for her previously diagnosed adrenal issues.  We'll hold any further dexamethasone as  premedication; and will give Compazine IV as a premedication instead.  Patient was given Benadryl 25 mg IV and Pepcid 20 mg IV today; which completely resolved all of her symptoms.    Patient stated understanding of all instructions; and was in agreement with this plan of care. The patient knows to call the clinic with any problems, questions or concerns.   Review/collaboration with Dr. Julien Nordmann regarding all aspects of patient's visit today.   Total time spent with patient was 40 minutes;  with greater than 75 percent of that time spent in face to face counseling regarding her symptoms, frequent monitoring of patient in the infusion area, and coordination of care and follow up.  Disclaimer: This note was dictated with voice recognition software. Similar sounding words can inadvertently be transcribed and may not be corrected upon review.   Drue Second, NP 10/29/2013

## 2013-10-29 NOTE — Telephone Encounter (Signed)
Compazine escribed, print rx disregarded

## 2013-10-29 NOTE — Assessment & Plan Note (Signed)
Patient initiated cycle 4 of her cisplatin/etoposide therapy regimen just yesterday 10/27/2013.  Patient noted facial flushing and edema when she awoke this morning.  She denies any swallowing difficulty or airway issues. Most likely, this is related to the dexamethasone premedication she was given  prior to her chemotherapy yesterday.  Patient states that she typically develops this reaction when she receives high-dose steroids.  Patient takes prednisone 7.5 mg on a daily basis for her previously diagnosed adrenal issues.  We'll hold any further dexamethasone as premedication; and will give Compazine IV as a premedication instead.  Patient was given Benadryl 25 mg IV and Pepcid 20 mg IV today; which completely resolved all of her symptoms.

## 2013-10-29 NOTE — Telephone Encounter (Signed)
Pt spoke with Dr Worthy Flank nurse today. Came in today for treatment

## 2013-10-29 NOTE — Telephone Encounter (Signed)
Message copied by Patton Salles on Thu Oct 29, 2013  3:36 PM ------      Message from: Drue Second R      Created: Thu Oct 29, 2013  1:03 PM       PROVIDER:  [place]      Triage: follow up call 24-48 hours please. ------

## 2013-10-30 ENCOUNTER — Other Ambulatory Visit: Payer: Self-pay | Admitting: Medical Oncology

## 2013-10-30 ENCOUNTER — Ambulatory Visit (HOSPITAL_BASED_OUTPATIENT_CLINIC_OR_DEPARTMENT_OTHER): Payer: Medicare Other | Admitting: Nurse Practitioner

## 2013-10-30 ENCOUNTER — Ambulatory Visit (HOSPITAL_BASED_OUTPATIENT_CLINIC_OR_DEPARTMENT_OTHER): Payer: Medicare Other

## 2013-10-30 ENCOUNTER — Encounter: Payer: Self-pay | Admitting: Nurse Practitioner

## 2013-10-30 VITALS — BP 90/40 | HR 108 | Temp 97.9°F | Resp 18

## 2013-10-30 DIAGNOSIS — C3490 Malignant neoplasm of unspecified part of unspecified bronchus or lung: Secondary | ICD-10-CM | POA: Diagnosis present

## 2013-10-30 DIAGNOSIS — T7840XD Allergy, unspecified, subsequent encounter: Secondary | ICD-10-CM

## 2013-10-30 DIAGNOSIS — I951 Orthostatic hypotension: Secondary | ICD-10-CM

## 2013-10-30 DIAGNOSIS — C3491 Malignant neoplasm of unspecified part of right bronchus or lung: Secondary | ICD-10-CM

## 2013-10-30 DIAGNOSIS — C3411 Malignant neoplasm of upper lobe, right bronchus or lung: Secondary | ICD-10-CM

## 2013-10-30 MED ORDER — PEGFILGRASTIM INJECTION 6 MG/0.6ML
6.0000 mg | Freq: Once | SUBCUTANEOUS | Status: AC
  Administered 2013-10-30: 6 mg via SUBCUTANEOUS
  Filled 2013-10-30: qty 0.6

## 2013-10-30 NOTE — Assessment & Plan Note (Signed)
Patient initiated cycle 4 of her cisplatin/etoposide chemotherapy regimen on 10/27/2013.  She did develop some facial flushing and edema within 24 hours of initiating the cycle.  She denied any swallowing difficulty or airway issues.  Patient states that she typically develops this reaction when she received high-dose steroids.  Patient did receive dexamethasone as premedication just yesterday prior to her chemotherapy.  Patient takes prednisone 7.5 mg on a daily basis for previously diagnosed adrenal issues.  The plan is to hold any further dexamethasone as premedication we'll give Compazine IV as premedication instead.  On brief exam today-it does appear the hypersensitivity symptoms including facial flushing and edema have resolved.

## 2013-10-30 NOTE — Assessment & Plan Note (Signed)
Patient returned to the [facility] today to receive her Neulasta injection.  Her grip pressure was decreased to 90/40; with the highest blood pressure reading of 96/60.  Patient states that she does feel occasionally dizzy when she changes positions quickly.  Confirmed the patient is not taking any antihypertensive medications at this time.  Patient is however, taking multiple psych and seizure medications.  Patient has received IV fluid rehydration for the past 2-3 days; and does not feel dehydrated today.  Patient was encouraged to stagger all of her daily medication so she may experience less lowering of her blood pressure.  Also, patient was encouraged to push fluids is much as possible over the weekend.  Patient had an assistant with her today to drive her home.  Patient was encouraged to call or go directly to the emergency department over the weekend if she develops any new or worsening symptoms whatsoever.  Patient stated understanding of all instructions.

## 2013-10-30 NOTE — Assessment & Plan Note (Signed)
Patient initiated cycle 4 of her cisplatin/etoposide chemotherapy regimen on 10/27/2013.  She has a restaging CTof the chest with contrast scheduled for 11/10/2013.  Her next cycle of chemotherapy will be due on 11/17/2013.  She will also continue with weekly labs.

## 2013-10-30 NOTE — Progress Notes (Signed)
SYMPTOM MANAGEMENT CLINIC   HPI: Tonya Sullivan 57 y.o. female diagnosed with lung cancer.  Patient is currently undergoing cisplatin/etoposide chemotherapy regimen.   Patient completed cycle 4 of her cisplatin/etoposide chemotherapy just yesterday 10/29/2013.  She returned to the [facility] today to receive her Neulasta injection.  She had been experiencing a mild hypersensitivity reaction to the dexamethasone which consisted of facial flushing and edema; but this has completely resolved.  She is complaining of some mild dizziness with quick position changes.  Her blood pressure was noted to be 90/40 at one point.  The highest blood pressure measurement obtained while the cancer Center today was 96/60.  Patient has received IV fluid rehydration for the past few days while at the [facility]; and feels that she is no longer dehydrated.  Confirmed with patient that she is not taking any antihypertensive medications.  Patient did report that she does take a multiple psychiatric and seizure medications; which is most likely causing her lower blood pressure.  Patient refused any IV fluid rehydration at today.  Advised patient to continue to push fluids over the weekend; and to try to stagger all of her daily.medications to see if this would help.  HPI  CURRENT THERAPY: Upcoming Treatment Dates - LUNG SMALL CELL - LIMITED STAGE Cisplatin D1 / Etoposide D1-3 q21d Days with orders from any treatment category:  11/17/2013      SCHEDULING COMMUNICATION      prochlorperazine (COMPAZINE) injection 10 mg      palonosetron (ALOXI) injection 0.25 mg      fosaprepitant (EMEND) 150 mg in sodium chloride 0.9 % 145 mL IVPB      CISplatin (PLATINOL) 123 mg in sodium chloride 0.9 % 500 mL chemo infusion      etoposide (VEPESID) 210 mg in sodium chloride 0.9 % 600 mL chemo infusion      sodium chloride 0.9 % injection 10 mL      heparin lock flush 100 unit/mL      heparin lock flush 100 unit/mL  alteplase (CATHFLO ACTIVASE) injection 2 mg      sodium chloride 0.9 % injection 3 mL      Hot Pack 1 packet      0.9 %  sodium chloride infusion      dextrose 5 % and 0.45% NaCl 1,000 mL with potassium chloride 20 mEq, magnesium sulfate 12 mEq, mannitol 12.5 g infusion      TREATMENT CONDITIONS 11/18/2013      SCHEDULING COMMUNICATION      prochlorperazine (COMPAZINE) injection 10 mg      etoposide (VEPESID) 210 mg in sodium chloride 0.9 % 600 mL chemo infusion      sodium chloride 0.9 % injection 10 mL      heparin lock flush 100 unit/mL      heparin lock flush 100 unit/mL      alteplase (CATHFLO ACTIVASE) injection 2 mg      sodium chloride 0.9 % injection 3 mL      Hot Pack 1 packet      0.9 %  sodium chloride infusion 11/19/2013      SCHEDULING COMMUNICATION      prochlorperazine (COMPAZINE) injection 10 mg      etoposide (VEPESID) 210 mg in sodium chloride 0.9 % 600 mL chemo infusion      sodium chloride 0.9 % injection 10 mL      heparin lock flush 100 unit/mL      heparin lock flush  100 unit/mL      alteplase (CATHFLO ACTIVASE) injection 2 mg      sodium chloride 0.9 % injection 3 mL      Hot Pack 1 packet      0.9 %  sodium chloride infusion    ROS  Past Medical History  Diagnosis Date  . COPD (chronic obstructive pulmonary disease)   . Depression   . Chronic back pain     DDD, disc bulge, radiculopathy, spinal stenosis  . Hyperlipidemia   . Anxiety   . Bipolar disorder, unspecified   . Carotid artery calcification   . DDD (degenerative disc disease), lumbosacral   . Spinal stenosis, lumbar   . Autoimmune thyroiditis   . Essential hypertension, benign   . TIA (transient ischemic attack)     August 2014  . GERD (gastroesophageal reflux disease)   . Neuropathic pain   . Addison's disease     On Prednisone  . History of pneumonia   . Achalasia   . Frequent falls   . DM (diabetes mellitus) with complications   . Hypothyroidism   . Melanoma   . Small cell  carcinoma of lung 08/06/2013    Past Surgical History  Procedure Laterality Date  . Appendectomy    . Cholecystectomy    . Lump left breast      Benign  . Abdominal hysterectomy      Tubal pregnancy  . Inguinal hernia repair    . Stomach surgery    . Ectopic pregnancy surgery    . Back surgery      x 5;1984;1989;;1999;2000;2010  . Colonoscopy  01/25/2012    Procedure: COLONOSCOPY;  Surgeon: Rogene Houston, MD;  Location: [facility];  Service: Endoscopy;  Laterality: N/A;  130  . Esophagogastroduodenoscopy (egd) with esophageal dilation N/A 08/13/2012    Procedure: ESOPHAGOGASTRODUODENOSCOPY (EGD) WITH ESOPHAGEAL DILATION;  Surgeon: Rogene Houston, MD;  Location: [facility];  Service: Endoscopy;  Laterality: N/A;  315  . Anterior cervical decomp/discectomy fusion N/A 10/21/2012    Procedure: Cervical Six-Seven Anterior cervical decompression/diskectomy/fusion;  Surgeon: Kristeen Miss, MD;  Location: [facility];  Service: Neurosurgery;  Laterality: N/A;  Cervical Six-Seven Anterior cervical decompression/diskectomy/fusion  . Cataract extraction w/phaco Right 11/25/2012    Procedure: CATARACT EXTRACTION PHACO AND INTRAOCULAR LENS PLACEMENT (IOC);  Surgeon: Elta Guadeloupe T. Gershon Crane, MD;  Location: [facility];  Service: Ophthalmology;  Laterality: Right;  CDE:10.06  . Cataract extraction w/phaco Left 12/09/2012    Procedure: CATARACT EXTRACTION PHACO AND INTRAOCULAR LENS PLACEMENT (IOC);  Surgeon: Elta Guadeloupe T. Gershon Crane, MD;  Location: [facility];  Service: Ophthalmology;  Laterality: Left;  CDE:5.06  . Tonsillectomy    . Hiatal hernia repair    . Heller myotomy N/A 03/17/2013    Procedure: DIAGNOSTIC LAPAROSCOPY, LAPAROSCOPIC HELLER MYOTOMY, ENDOSCOPY, DOR FUNDOPLICATION;  Surgeon: Ralene Ok, MD;  Location: [facility];  Service: General;  Laterality: N/A;  . Breast surgery      Left breast  . Eye surgery    . Video bronchoscopy with endobronchial ultrasound N/A 07/24/2013    Procedure: VIDEO BRONCHOSCOPY  WITH ENDOBRONCHIAL ULTRASOUND;  Surgeon: [name], MD;  Location: [facility];  Service: Thoracic;  Laterality: N/A;    has Hyperlipidemia; Depression; Chronic pain; COPD (chronic obstructive pulmonary disease); Tobacco use; Chronic back pain; Bipolar disorder; Carotid stenosis; Essential hypertension; Mole of skin; Fall; Hoarse voice quality; Spontaneous ecchymoses; TIA (transient ischemic attack); Dysarthria; Stricture and stenosis of esophagus; Dysphagia; Peripheral edema; Urinary incontinence; Adrenal  cortical insufficiency; Cervical spondylosis without myelopathy; Gait disturbance; Chalasia of lower esophageal sphincter; MDD (major depressive disorder), recurrent severe, without psychosis; PTSD (post-traumatic stress disorder); Unspecified episodic mood disorder; Memory problem; Lung mass; GERD (gastroesophageal reflux disease); Small cell carcinoma of lung; Elevated troponin; Anemia, unspecified; Obesity, morbid; HTN (hypertension); Hypothyroidism; Unspecified protein-calorie malnutrition; Low BP; Fatigue; Decreased mobility; Physical deconditioning; Hypoalbuminemia; Hypersensitivity reaction; Orthostatic hypotension; Dehydration; Hyperkalemia; and Thrombocytopenia on her problem list.     is allergic to codeine; cyclobenzaprine; darvocet; abilify; ace inhibitors; adhesive; gabapentin; iodine; levofloxacin; prednisone; sulfa antibiotics; and robaxin.    Medication List       This list is accurate as of: 10/30/13  5:16 PM.  Always use your most recent med list.               albuterol 108 (90 BASE) MCG/ACT inhaler  Commonly known as:  PROVENTIL HFA;VENTOLIN HFA  Inhale 2 puffs into the lungs 3 (three) times daily as needed for wheezing or shortness of breath.     amitriptyline 100 MG tablet  Commonly known as:  ELAVIL  Take 1 tablet (100 mg total) by mouth at bedtime.     clonazePAM 1 MG tablet  Commonly known as:  KLONOPIN  Take 1 tablet (1 mg total) by mouth 4 (four) times daily.      dexlansoprazole 60 MG capsule  Commonly known as:  DEXILANT  Take 1 capsule (60 mg total) by mouth daily.     divalproex 500 MG 24 hr tablet  Commonly known as:  DEPAKOTE ER  Take 1 tablet (500 mg total) by mouth daily.     donepezil 5 MG tablet  Commonly known as:  ARICEPT  Take 1 tablet (5 mg total) by mouth at bedtime.     DULoxetine 60 MG capsule  Commonly known as:  CYMBALTA  Take 1 capsule (60 mg total) by mouth every morning.     EPINEPHrine 0.3 mg/0.3 mL Soaj injection  Commonly known as:  EPIPEN  Inject 0.3 mLs (0.3 mg total) into the muscle once. PRN FOR SEVERE ALLERGIC REACTION     feeding supplement (ENSURE COMPLETE) Liqd  Take 237 mLs by mouth daily.     fentaNYL 50 MCG/HR  Commonly known as:  DURAGESIC - dosed mcg/hr  Place 1 patch (50 mcg total) onto the skin every 3 (three) days.     levothyroxine 25 MCG tablet  Commonly known as:  SYNTHROID, LEVOTHROID  Take 25 mcg by mouth daily before breakfast.     lidocaine 5 %  Commonly known as:  LIDODERM  Place 1 patch onto the skin daily. Remove & Discard patch within 12 hours or as directed by MD     lidocaine-prilocaine cream  Commonly known as:  EMLA  Apply 1 application topically as needed. Apply to port 1 hr before chemo     ondansetron 4 MG tablet  Commonly known as:  ZOFRAN  Take 1 tablet (4 mg total) by mouth every 6 (six) hours as needed for refractory nausea / vomiting.     oxyCODONE-acetaminophen 5-325 MG per tablet  Commonly known as:  PERCOCET/ROXICET  Take 1 tablet by mouth every 6 (six) hours as needed for severe pain.     pantoprazole 40 MG tablet  Commonly known as:  PROTONIX  TAKE (1) TABLET BY MOUTH DAILY.     pravastatin 40 MG tablet  Commonly known as:  PRAVACHOL  Take 40 mg by mouth daily.     predniSONE 2.5 MG  tablet  Commonly known as:  DELTASONE  Take 3 tablets (7.5 mg total) by mouth daily with breakfast.     prochlorperazine 10 MG tablet  Commonly known as:  COMPAZINE    Take 1 tablet (10 mg total) by mouth every 6 (six) hours as needed for nausea or vomiting.     SPIRIVA RESPIMAT 2.5 MCG/ACT Aers  Generic drug:  Tiotropium Bromide Monohydrate  Inhale 2 puffs into the lungs daily.         PHYSICAL EXAMINATION  Vitals: BP: 86/62, 96/60, 90/40, HR 102, temp 97.9  Physical Exam  Nursing note and vitals reviewed. Constitutional: She is oriented to person, place, and time and well-developed, well-nourished, and in no distress.  HENT:  Head: Normocephalic and atraumatic.  Eyes: Conjunctivae and EOM are normal. Pupils are equal, round, and reactive to light. No scleral icterus.  Neck: Normal range of motion. No tracheal deviation present.  Pulmonary/Chest: Effort normal. No respiratory distress.  Musculoskeletal: Normal range of motion. She exhibits no edema and no tenderness.  Neurological: She is alert and oriented to person, place, and time.  Skin: Skin is warm and dry. No rash noted. No erythema.  Psychiatric: Affect normal.    LABORATORY DATA:. No visits with results within 3 Day(s) from this visit. Latest known visit with results is:  Appointment on 10/26/2013  Component Date Value Ref Range Status  . WBC 10/26/2013 31.2* 3.9 - 10.3 10e3/uL Final  . NEUT# 10/26/2013 28.7* 1.5 - 6.5 10e3/uL Final  . HGB 10/26/2013 10.2* 11.6 - 15.9 g/dL Final  . HCT 10/26/2013 32.0* 34.8 - 46.6 % Final  . Platelets 10/26/2013 257  145 - 400 10e3/uL Final  . MCV 10/26/2013 96.6  79.5 - 101.0 fL Final  . MCH 10/26/2013 30.7  25.1 - 34.0 pg Final  . MCHC 10/26/2013 31.7  31.5 - 36.0 g/dL Final  . RBC 10/26/2013 3.32* 3.70 - 5.45 10e6/uL Final  . RDW 10/26/2013 21.3* 11.2 - 14.5 % Final  . lymph# 10/26/2013 1.0  0.9 - 3.3 10e3/uL Final  . MONO# 10/26/2013 1.3* 0.1 - 0.9 10e3/uL Final  . Eosinophils Absolute 10/26/2013 0.1  0.0 - 0.5 10e3/uL Final  . Basophils Absolute 10/26/2013 0.1  0.0 - 0.1 10e3/uL Final  . NEUT% 10/26/2013 92.1* 38.4 - 76.8 % Final  .  LYMPH% 10/26/2013 3.1* 14.0 - 49.7 % Final  . MONO% 10/26/2013 4.3  0.0 - 14.0 % Final  . EOS% 10/26/2013 0.2  0.0 - 7.0 % Final  . BASO% 10/26/2013 0.3  0.0 - 2.0 % Final  . Sodium 10/26/2013 135* 136 - 145 mEq/L Final  . Potassium 10/26/2013 5.1  3.5 - 5.1 mEq/L Final  . Chloride 10/26/2013 102  98 - 109 mEq/L Final  . CO2 10/26/2013 26  22 - 29 mEq/L Final  . Glucose 10/26/2013 110  70 - 140 mg/dl Final  . BUN 10/26/2013 7.2  7.0 - 26.0 mg/dL Final  . Creatinine 10/26/2013 0.9  0.6 - 1.1 mg/dL Final  . Total Bilirubin 10/26/2013 <0.20  0.20 - 1.20 mg/dL Final  . Alkaline Phosphatase 10/26/2013 147  40 - 150 U/L Final  . AST 10/26/2013 12  5 - 34 U/L Final  . ALT 10/26/2013 9  0 - 55 U/L Final  . Total Protein 10/26/2013 6.8  6.4 - 8.3 g/dL Final  . Albumin 10/26/2013 3.5  3.5 - 5.0 g/dL Final  . Calcium 10/26/2013 9.3  8.4 - 10.4 mg/dL Final  . Anion  Gap 10/26/2013 7  3 - 11 mEq/L Final  . Magnesium 10/26/2013 2.3  1.5 - 2.5 mg/dl Final     RADIOGRAPHIC STUDIES: No results found.  ASSESSMENT/PLAN:    Small cell carcinoma of lung  Assessment & Plan Patient initiated cycle 4 of her cisplatin/etoposide chemotherapy regimen on 10/27/2013.  She has a restaging CTof the chest with contrast scheduled for 11/10/2013.  Her next cycle of chemotherapy will be due on 11/17/2013.  She will also continue with weekly labs.     Hypersensitivity reaction  Assessment & Plan Patient initiated cycle 4 of her cisplatin/etoposide chemotherapy regimen on 10/27/2013.  She did develop some facial flushing and edema within 24 hours of initiating the cycle.  She denied any swallowing difficulty or airway issues.  Patient states that she typically develops this reaction when she received high-dose steroids.  Patient did receive dexamethasone as premedication just yesterday prior to her chemotherapy.  Patient takes prednisone 7.5 mg on a daily basis for previously diagnosed adrenal issues.  The plan is to  hold any further dexamethasone as premedication we'll give Compazine IV as premedication instead.  On brief exam today-it does appear the hypersensitivity symptoms including facial flushing and edema have resolved.   Orthostatic hypotension  Assessment & Plan Patient returned to the [facility] today to receive her Neulasta injection.  Her grip pressure was decreased to 90/40; with the highest blood pressure reading of 96/60.  Patient states that she does feel occasionally dizzy when she changes positions quickly.  Confirmed the patient is not taking any antihypertensive medications at this time.  Patient is however, taking multiple psych and seizure medications.  Patient has received IV fluid rehydration for the past 2-3 days; and does not feel dehydrated today.  Patient was encouraged to stagger all of her daily medication so she may experience less lowering of her blood pressure.  Also, patient was encouraged to push fluids is much as possible over the weekend.  Patient had an assistant with her today to drive her home.  Patient was encouraged to call or go directly to the emergency department over the weekend if she develops any new or worsening symptoms whatsoever.  Patient stated understanding of all instructions.   Patient stated understanding of all instructions; and was in agreement with this plan of care. The patient knows to call the clinic with any problems, questions or concerns.   Review/collaboration with Dr. Julien Nordmann regarding all aspects of patient's visit today.   Total time spent with patient was 15 minutes;  with greater than 80 percent of that time spent in face to face counseling regarding her symptoms, and coordination of care and follow up.  Disclaimer: This note was dictated with voice recognition software. Similar sounding words can inadvertently be transcribed and may not be corrected upon review.   Drue Second, NP 10/30/2013

## 2013-11-02 ENCOUNTER — Ambulatory Visit (HOSPITAL_COMMUNITY): Payer: Self-pay | Admitting: Psychiatry

## 2013-11-02 ENCOUNTER — Encounter (HOSPITAL_COMMUNITY): Payer: Self-pay | Admitting: Psychiatry

## 2013-11-02 ENCOUNTER — Encounter: Payer: Self-pay | Admitting: Nurse Practitioner

## 2013-11-02 ENCOUNTER — Telehealth: Payer: Self-pay | Admitting: *Deleted

## 2013-11-02 NOTE — Telephone Encounter (Signed)
,  Tonya Sullivan was called today for f/u after sensitivity reaction last week with treatment.  Her only complaint is that she is tired.  No complaints of any kind.  Instructed to not overdo activities.  She denies trouble sleeping and is eating and drinking well.  No new complaints

## 2013-11-03 ENCOUNTER — Ambulatory Visit (INDEPENDENT_AMBULATORY_CARE_PROVIDER_SITE_OTHER): Payer: Medicare Other | Admitting: Psychiatry

## 2013-11-03 ENCOUNTER — Encounter (HOSPITAL_COMMUNITY): Payer: Self-pay | Admitting: Psychiatry

## 2013-11-03 ENCOUNTER — Other Ambulatory Visit (HOSPITAL_BASED_OUTPATIENT_CLINIC_OR_DEPARTMENT_OTHER): Payer: Medicare Other

## 2013-11-03 VITALS — BP 97/64 | HR 100 | Ht 67.0 in | Wt 199.6 lb

## 2013-11-03 DIAGNOSIS — F332 Major depressive disorder, recurrent severe without psychotic features: Secondary | ICD-10-CM

## 2013-11-03 DIAGNOSIS — C3491 Malignant neoplasm of unspecified part of right bronchus or lung: Secondary | ICD-10-CM

## 2013-11-03 DIAGNOSIS — C3490 Malignant neoplasm of unspecified part of unspecified bronchus or lung: Secondary | ICD-10-CM

## 2013-11-03 DIAGNOSIS — F419 Anxiety disorder, unspecified: Secondary | ICD-10-CM

## 2013-11-03 LAB — CBC WITH DIFFERENTIAL/PLATELET
BASO%: 0.5 % (ref 0.0–2.0)
Basophils Absolute: 0 10*3/uL (ref 0.0–0.1)
EOS ABS: 0 10*3/uL (ref 0.0–0.5)
EOS%: 0.2 % (ref 0.0–7.0)
HCT: 25.5 % — ABNORMAL LOW (ref 34.8–46.6)
HGB: 8.3 g/dL — ABNORMAL LOW (ref 11.6–15.9)
LYMPH%: 8.9 % — ABNORMAL LOW (ref 14.0–49.7)
MCH: 31.6 pg (ref 25.1–34.0)
MCHC: 32.7 g/dL (ref 31.5–36.0)
MCV: 96.8 fL (ref 79.5–101.0)
MONO#: 0.1 10*3/uL (ref 0.1–0.9)
MONO%: 1.5 % (ref 0.0–14.0)
NEUT%: 88.9 % — ABNORMAL HIGH (ref 38.4–76.8)
NEUTROS ABS: 4.5 10*3/uL (ref 1.5–6.5)
Platelets: 110 10*3/uL — ABNORMAL LOW (ref 145–400)
RBC: 2.64 10*6/uL — AB (ref 3.70–5.45)
RDW: 20.9 % — AB (ref 11.2–14.5)
WBC: 5.1 10*3/uL (ref 3.9–10.3)
lymph#: 0.5 10*3/uL — ABNORMAL LOW (ref 0.9–3.3)

## 2013-11-03 LAB — COMPREHENSIVE METABOLIC PANEL (CC13)
ALBUMIN: 3.4 g/dL — AB (ref 3.5–5.0)
ALT: 9 U/L (ref 0–55)
AST: 10 U/L (ref 5–34)
Alkaline Phosphatase: 101 U/L (ref 40–150)
Anion Gap: 6 mEq/L (ref 3–11)
BUN: 14.3 mg/dL (ref 7.0–26.0)
CO2: 29 meq/L (ref 22–29)
Calcium: 9 mg/dL (ref 8.4–10.4)
Chloride: 100 mEq/L (ref 98–109)
Creatinine: 0.8 mg/dL (ref 0.6–1.1)
GLUCOSE: 93 mg/dL (ref 70–140)
POTASSIUM: 5.2 meq/L — AB (ref 3.5–5.1)
SODIUM: 135 meq/L — AB (ref 136–145)
Total Bilirubin: 0.46 mg/dL (ref 0.20–1.20)
Total Protein: 6.3 g/dL — ABNORMAL LOW (ref 6.4–8.3)

## 2013-11-03 LAB — MAGNESIUM (CC13): MAGNESIUM: 2.1 mg/dL (ref 1.5–2.5)

## 2013-11-03 MED ORDER — DIVALPROEX SODIUM ER 500 MG PO TB24: 500.0000 mg | ORAL_TABLET | Freq: Every day | ORAL | Status: DC

## 2013-11-03 MED ORDER — CLONAZEPAM 1 MG PO TABS: 1.0000 mg | ORAL_TABLET | Freq: Four times a day (QID) | ORAL | Status: DC

## 2013-11-03 MED ORDER — DULOXETINE HCL 60 MG PO CPEP: 60.0000 mg | ORAL_CAPSULE | Freq: Every morning | ORAL | Status: DC

## 2013-11-03 MED ORDER — AMITRIPTYLINE HCL 100 MG PO TABS: 100.0000 mg | ORAL_TABLET | Freq: Every day | ORAL | Status: DC
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
## 2013-11-04 ENCOUNTER — Telehealth: Payer: Self-pay | Admitting: *Deleted

## 2013-11-04 NOTE — Telephone Encounter (Signed)
Called left a vm message.  I stated I hope she was feeling ok and asked how she was doing with her smoking cessation.  I left my name and phone number to call with assistance with smoking cessation

## 2013-11-06 ENCOUNTER — Inpatient Hospital Stay (HOSPITAL_COMMUNITY)
Admission: AD | Admit: 2013-11-06 | Discharge: 2013-11-10 | DRG: 809 | Disposition: A | Discharge status: Home / Self Care | Payer: Medicare Other | Source: Ambulatory Visit | Attending: Internal Medicine | Admitting: Internal Medicine

## 2013-11-06 ENCOUNTER — Encounter (HOSPITAL_COMMUNITY): Payer: Self-pay

## 2013-11-06 ENCOUNTER — Inpatient Hospital Stay (HOSPITAL_COMMUNITY): Payer: Medicare Other

## 2013-11-06 ENCOUNTER — Encounter: Payer: Self-pay | Admitting: Radiation Oncology

## 2013-11-06 ENCOUNTER — Ambulatory Visit
Admission: RE | Admit: 2013-11-06 | Discharge: 2013-11-06 | Disposition: A | Discharge status: Home / Self Care | Payer: Medicare Other | Source: Ambulatory Visit | Attending: Radiation Oncology | Admitting: Radiation Oncology

## 2013-11-06 VITALS — BP 102/42 | HR 143 | Temp 97.2°F | Wt 201.3 lb

## 2013-11-06 DIAGNOSIS — E119 Type 2 diabetes mellitus without complications: Secondary | ICD-10-CM | POA: Diagnosis present

## 2013-11-06 DIAGNOSIS — Z90710 Acquired absence of both cervix and uterus: Secondary | ICD-10-CM | POA: Diagnosis not present

## 2013-11-06 DIAGNOSIS — K220 Achalasia of cardia: Secondary | ICD-10-CM | POA: Diagnosis present

## 2013-11-06 DIAGNOSIS — I10 Essential (primary) hypertension: Secondary | ICD-10-CM | POA: Diagnosis present

## 2013-11-06 DIAGNOSIS — M549 Dorsalgia, unspecified: Secondary | ICD-10-CM | POA: Diagnosis present

## 2013-11-06 DIAGNOSIS — W19XXXA Unspecified fall, initial encounter: Secondary | ICD-10-CM | POA: Diagnosis present

## 2013-11-06 DIAGNOSIS — R51 Headache: Secondary | ICD-10-CM | POA: Diagnosis present

## 2013-11-06 DIAGNOSIS — R062 Wheezing: Secondary | ICD-10-CM

## 2013-11-06 DIAGNOSIS — E785 Hyperlipidemia, unspecified: Secondary | ICD-10-CM | POA: Diagnosis present

## 2013-11-06 DIAGNOSIS — Y92009 Unspecified place in unspecified non-institutional (private) residence as the place of occurrence of the external cause: Secondary | ICD-10-CM

## 2013-11-06 DIAGNOSIS — C3490 Malignant neoplasm of unspecified part of unspecified bronchus or lung: Secondary | ICD-10-CM | POA: Diagnosis present

## 2013-11-06 DIAGNOSIS — Z8673 Personal history of transient ischemic attack (TIA), and cerebral infarction without residual deficits: Secondary | ICD-10-CM | POA: Diagnosis not present

## 2013-11-06 DIAGNOSIS — M5117 Intervertebral disc disorders with radiculopathy, lumbosacral region: Secondary | ICD-10-CM | POA: Diagnosis present

## 2013-11-06 DIAGNOSIS — Z7952 Long term (current) use of systemic steroids: Secondary | ICD-10-CM

## 2013-11-06 DIAGNOSIS — E063 Autoimmune thyroiditis: Secondary | ICD-10-CM | POA: Diagnosis present

## 2013-11-06 DIAGNOSIS — Z91048 Other nonmedicinal substance allergy status: Secondary | ICD-10-CM | POA: Diagnosis not present

## 2013-11-06 DIAGNOSIS — E271 Primary adrenocortical insufficiency: Secondary | ICD-10-CM | POA: Diagnosis present

## 2013-11-06 DIAGNOSIS — Z885 Allergy status to narcotic agent status: Secondary | ICD-10-CM | POA: Diagnosis not present

## 2013-11-06 DIAGNOSIS — Z6831 Body mass index (BMI) 31.0-31.9, adult: Secondary | ICD-10-CM

## 2013-11-06 DIAGNOSIS — R531 Weakness: Secondary | ICD-10-CM

## 2013-11-06 DIAGNOSIS — Z881 Allergy status to other antibiotic agents status: Secondary | ICD-10-CM

## 2013-11-06 DIAGNOSIS — E039 Hypothyroidism, unspecified: Secondary | ICD-10-CM | POA: Diagnosis present

## 2013-11-06 DIAGNOSIS — J449 Chronic obstructive pulmonary disease, unspecified: Secondary | ICD-10-CM | POA: Diagnosis present

## 2013-11-06 DIAGNOSIS — Z882 Allergy status to sulfonamides status: Secondary | ICD-10-CM | POA: Diagnosis not present

## 2013-11-06 DIAGNOSIS — K219 Gastro-esophageal reflux disease without esophagitis: Secondary | ICD-10-CM | POA: Diagnosis present

## 2013-11-06 DIAGNOSIS — J441 Chronic obstructive pulmonary disease with (acute) exacerbation: Secondary | ICD-10-CM | POA: Diagnosis present

## 2013-11-06 DIAGNOSIS — T8089XA Other complications following infusion, transfusion and therapeutic injection, initial encounter: Secondary | ICD-10-CM | POA: Diagnosis not present

## 2013-11-06 DIAGNOSIS — F32A Depression, unspecified: Secondary | ICD-10-CM | POA: Diagnosis present

## 2013-11-06 DIAGNOSIS — R413 Other amnesia: Secondary | ICD-10-CM | POA: Diagnosis present

## 2013-11-06 DIAGNOSIS — G8929 Other chronic pain: Secondary | ICD-10-CM | POA: Diagnosis present

## 2013-11-06 DIAGNOSIS — D61810 Antineoplastic chemotherapy induced pancytopenia: Secondary | ICD-10-CM | POA: Diagnosis present

## 2013-11-06 DIAGNOSIS — Z923 Personal history of irradiation: Secondary | ICD-10-CM

## 2013-11-06 DIAGNOSIS — L298 Other pruritus: Secondary | ICD-10-CM | POA: Diagnosis not present

## 2013-11-06 DIAGNOSIS — Z9221 Personal history of antineoplastic chemotherapy: Secondary | ICD-10-CM

## 2013-11-06 DIAGNOSIS — Z8249 Family history of ischemic heart disease and other diseases of the circulatory system: Secondary | ICD-10-CM

## 2013-11-06 DIAGNOSIS — Z9049 Acquired absence of other specified parts of digestive tract: Secondary | ICD-10-CM | POA: Diagnosis present

## 2013-11-06 DIAGNOSIS — F319 Bipolar disorder, unspecified: Secondary | ICD-10-CM | POA: Diagnosis present

## 2013-11-06 DIAGNOSIS — Z8701 Personal history of pneumonia (recurrent): Secondary | ICD-10-CM

## 2013-11-06 DIAGNOSIS — F419 Anxiety disorder, unspecified: Secondary | ICD-10-CM | POA: Diagnosis present

## 2013-11-06 DIAGNOSIS — I6529 Occlusion and stenosis of unspecified carotid artery: Secondary | ICD-10-CM | POA: Diagnosis present

## 2013-11-06 DIAGNOSIS — F329 Major depressive disorder, single episode, unspecified: Secondary | ICD-10-CM

## 2013-11-06 DIAGNOSIS — Z9181 History of falling: Secondary | ICD-10-CM | POA: Diagnosis not present

## 2013-11-06 DIAGNOSIS — M4806 Spinal stenosis, lumbar region: Secondary | ICD-10-CM | POA: Diagnosis present

## 2013-11-06 DIAGNOSIS — E2740 Unspecified adrenocortical insufficiency: Secondary | ICD-10-CM

## 2013-11-06 DIAGNOSIS — C439 Malignant melanoma of skin, unspecified: Secondary | ICD-10-CM | POA: Diagnosis present

## 2013-11-06 DIAGNOSIS — Z720 Tobacco use: Secondary | ICD-10-CM

## 2013-11-06 DIAGNOSIS — Z79899 Other long term (current) drug therapy: Secondary | ICD-10-CM | POA: Diagnosis not present

## 2013-11-06 DIAGNOSIS — I959 Hypotension, unspecified: Secondary | ICD-10-CM | POA: Diagnosis present

## 2013-11-06 DIAGNOSIS — R519 Headache, unspecified: Secondary | ICD-10-CM | POA: Diagnosis present

## 2013-11-06 DIAGNOSIS — Z888 Allergy status to other drugs, medicaments and biological substances status: Secondary | ICD-10-CM | POA: Diagnosis not present

## 2013-11-06 DIAGNOSIS — T7840XA Allergy, unspecified, initial encounter: Secondary | ICD-10-CM | POA: Diagnosis not present

## 2013-11-06 LAB — COMPREHENSIVE METABOLIC PANEL
ALK PHOS: 99 U/L (ref 39–117)
ALT: 7 U/L (ref 0–35)
AST: 7 U/L (ref 0–37)
Albumin: 3.1 g/dL — ABNORMAL LOW (ref 3.5–5.2)
Anion gap: 8 (ref 5–15)
BUN: 19 mg/dL (ref 6–23)
CALCIUM: 8.6 mg/dL (ref 8.4–10.5)
CO2: 28 meq/L (ref 19–32)
Chloride: 103 mEq/L (ref 96–112)
Creatinine, Ser: 0.89 mg/dL (ref 0.50–1.10)
GFR, EST AFRICAN AMERICAN: 81 mL/min — AB (ref >90)
GFR, EST NON AFRICAN AMERICAN: 70 mL/min — AB (ref >90)
GLUCOSE: 106 mg/dL — AB (ref 70–99)
Potassium: 4.5 mEq/L (ref 3.7–5.3)
Sodium: 139 mEq/L (ref 137–147)
Total Bilirubin: <0.2 mg/dL — ABNORMAL LOW (ref 0.3–1.2)
Total Protein: 5.8 g/dL — ABNORMAL LOW (ref 6.0–8.3)

## 2013-11-06 LAB — CBC WITH DIFFERENTIAL/PLATELET
BASOS ABS: 0 10*3/uL (ref 0.0–0.1)
Basophils Relative: 2 % — ABNORMAL HIGH (ref 0–1)
Eosinophils Absolute: 0 10*3/uL (ref 0.0–0.7)
Eosinophils Relative: 2 % (ref 0–5)
HCT: 20.3 % — ABNORMAL LOW (ref 36.0–46.0)
Hemoglobin: 6.8 g/dL — CL (ref 12.0–15.0)
Lymphocytes Relative: 69 % — ABNORMAL HIGH (ref 12–46)
Lymphs Abs: 0.5 10*3/uL — ABNORMAL LOW (ref 0.7–4.0)
MCH: 31.8 pg (ref 26.0–34.0)
MCHC: 33.5 g/dL (ref 30.0–36.0)
MCV: 94.9 fL (ref 78.0–100.0)
MONOS PCT: 5 % (ref 3–12)
Monocytes Absolute: 0 10*3/uL — ABNORMAL LOW (ref 0.1–1.0)
NEUTROS PCT: 22 % — AB (ref 43–77)
Neutro Abs: 0.2 10*3/uL — ABNORMAL LOW (ref 1.7–7.7)
PLATELETS: 26 10*3/uL — AB (ref 150–400)
RBC: 2.14 MIL/uL — AB (ref 3.87–5.11)
RDW: 18.1 % — AB (ref 11.5–15.5)
WBC: 0.7 10*3/uL — AB (ref 4.0–10.5)

## 2013-11-06 LAB — APTT: aPTT: 26 seconds (ref 24–37)

## 2013-11-06 LAB — PHOSPHORUS: Phosphorus: 3.2 mg/dL (ref 2.3–4.6)

## 2013-11-06 LAB — PROTIME-INR
INR: 0.91 (ref 0.00–1.49)
Prothrombin Time: 12.3 seconds (ref 11.6–15.2)

## 2013-11-06 LAB — MAGNESIUM: Magnesium: 2.1 mg/dL (ref 1.5–2.5)

## 2013-11-06 LAB — ETHANOL: Alcohol, Ethyl (B): <11 mg/dL (ref 0–11)

## 2013-11-06 MED ORDER — ENSURE COMPLETE PO LIQD: 237.0000 mL | ORAL | Status: DC

## 2013-11-06 MED ORDER — OXYCODONE-ACETAMINOPHEN 5-325 MG PO TABS
1.0000 | ORAL_TABLET | ORAL | Status: DC | PRN
  Administered 2013-11-06: 1 via ORAL
  Administered 2013-11-07 – 2013-11-10 (×12): 2 via ORAL
  Filled 2013-11-06 (×14): qty 2

## 2013-11-06 MED ORDER — TIOTROPIUM BROMIDE MONOHYDRATE 2.5 MCG/ACT IN AERS: 2.0000 | INHALATION_SPRAY | Freq: Every day | RESPIRATORY_TRACT | Status: DC

## 2013-11-06 MED ORDER — ONDANSETRON HCL 4 MG/2ML IJ SOLN
4.0000 mg | Freq: Four times a day (QID) | INTRAMUSCULAR | Status: DC | PRN
  Administered 2013-11-07: 4 mg via INTRAVENOUS
  Filled 2013-11-06 (×3): qty 2

## 2013-11-06 MED ORDER — SODIUM CHLORIDE 0.9 % IV SOLN: 250.0000 mL | INTRAVENOUS | Status: DC | PRN

## 2013-11-06 MED ORDER — PANTOPRAZOLE SODIUM 40 MG PO TBEC
40.0000 mg | DELAYED_RELEASE_TABLET | Freq: Every day | ORAL | Status: DC
  Administered 2013-11-06 – 2013-11-09 (×4): 40 mg via ORAL
  Filled 2013-11-06 (×6): qty 1

## 2013-11-06 MED ORDER — SODIUM CHLORIDE 0.9 % IJ SOLN
INTRAMUSCULAR | Status: AC
  Administered 2013-11-06: 18:00:00
  Filled 2013-11-06: qty 10

## 2013-11-06 MED ORDER — MORPHINE SULFATE 2 MG/ML IJ SOLN
1.0000 mg | INTRAMUSCULAR | Status: DC | PRN
  Administered 2013-11-07 – 2013-11-10 (×14): 2 mg via INTRAVENOUS
  Filled 2013-11-06 (×14): qty 1

## 2013-11-06 MED ORDER — CLONAZEPAM 1 MG PO TABS
1.0000 mg | ORAL_TABLET | Freq: Four times a day (QID) | ORAL | Status: DC
  Administered 2013-11-06 – 2013-11-09 (×13): 1 mg via ORAL
  Filled 2013-11-06 (×13): qty 1

## 2013-11-06 MED ORDER — ONDANSETRON HCL 4 MG PO TABS
4.0000 mg | ORAL_TABLET | Freq: Four times a day (QID) | ORAL | Status: DC | PRN
  Administered 2013-11-09 – 2013-11-10 (×2): 4 mg via ORAL
  Filled 2013-11-06: qty 1

## 2013-11-06 MED ORDER — SODIUM CHLORIDE 0.9 % IV SOLN
Freq: Once | INTRAVENOUS | Status: AC
  Administered 2013-11-07: 02:00:00 via INTRAVENOUS

## 2013-11-06 MED ORDER — FENTANYL 50 MCG/HR TD PT72
50.0000 ug | MEDICATED_PATCH | TRANSDERMAL | Status: DC
  Administered 2013-11-06 – 2013-11-09 (×2): 50 ug via TRANSDERMAL
  Filled 2013-11-06 (×2): qty 1

## 2013-11-06 MED ORDER — PREDNISONE 2.5 MG PO TABS
7.5000 mg | ORAL_TABLET | Freq: Every day | ORAL | Status: DC
  Administered 2013-11-07 – 2013-11-10 (×4): 7.5 mg via ORAL
  Filled 2013-11-06 (×5): qty 1

## 2013-11-06 MED ORDER — DIVALPROEX SODIUM ER 500 MG PO TB24
500.0000 mg | ORAL_TABLET | Freq: Every day | ORAL | Status: DC
  Administered 2013-11-06 – 2013-11-09 (×4): 500 mg via ORAL
  Filled 2013-11-06 (×5): qty 1

## 2013-11-06 MED ORDER — SODIUM CHLORIDE 0.9 % IJ SOLN
3.0000 mL | Freq: Two times a day (BID) | INTRAMUSCULAR | Status: DC
Start: 2013-11-06 — End: 2013-11-10
  Administered 2013-11-06: 3 mL via INTRAVENOUS

## 2013-11-06 MED ORDER — SODIUM CHLORIDE 0.9 % IV SOLN
INTRAVENOUS | Status: AC
Start: 2013-11-06 — End: 2013-11-07
  Administered 2013-11-06: 22:00:00 via INTRAVENOUS

## 2013-11-06 MED ORDER — PROCHLORPERAZINE MALEATE 10 MG PO TABS
10.0000 mg | ORAL_TABLET | Freq: Four times a day (QID) | ORAL | Status: DC | PRN
  Administered 2013-11-09: 10 mg via ORAL
  Filled 2013-11-06: qty 1

## 2013-11-06 MED ORDER — TIOTROPIUM BROMIDE MONOHYDRATE 18 MCG IN CAPS
18.0000 ug | ORAL_CAPSULE | Freq: Every day | RESPIRATORY_TRACT | Status: DC
  Administered 2013-11-07 – 2013-11-10 (×4): 18 ug via RESPIRATORY_TRACT
  Filled 2013-11-06: qty 5

## 2013-11-06 MED ORDER — PRAVASTATIN SODIUM 40 MG PO TABS
40.0000 mg | ORAL_TABLET | Freq: Every day | ORAL | Status: DC
  Administered 2013-11-06 – 2013-11-09 (×4): 40 mg via ORAL
  Filled 2013-11-06 (×5): qty 1

## 2013-11-06 MED ORDER — SODIUM CHLORIDE 0.9 % IJ SOLN: 3.0000 mL | INTRAMUSCULAR | Status: DC | PRN

## 2013-11-06 MED ORDER — AMITRIPTYLINE HCL 100 MG PO TABS
100.0000 mg | ORAL_TABLET | Freq: Every day | ORAL | Status: DC
  Administered 2013-11-06 – 2013-11-09 (×4): 100 mg via ORAL
  Filled 2013-11-06 (×5): qty 1

## 2013-11-06 MED ORDER — ALTEPLASE 2 MG IJ SOLR
2.0000 mg | Freq: Once | INTRAMUSCULAR | Status: AC
  Administered 2013-11-06: 2 mg
  Filled 2013-11-06: qty 2

## 2013-11-06 MED ORDER — DULOXETINE HCL 60 MG PO CPEP
60.0000 mg | ORAL_CAPSULE | Freq: Every morning | ORAL | Status: DC
  Administered 2013-11-07 – 2013-11-09 (×3): 60 mg via ORAL
  Filled 2013-11-06 (×4): qty 1

## 2013-11-06 MED ORDER — LEVOTHYROXINE SODIUM 25 MCG PO TABS
25.0000 ug | ORAL_TABLET | Freq: Every day | ORAL | Status: DC
  Administered 2013-11-06 – 2013-11-09 (×4): 25 ug via ORAL
  Filled 2013-11-06 (×5): qty 1

## 2013-11-06 MED ORDER — ALBUTEROL SULFATE (2.5 MG/3ML) 0.083% IN NEBU
2.5000 mg | INHALATION_SOLUTION | Freq: Three times a day (TID) | RESPIRATORY_TRACT | Status: DC | PRN
  Administered 2013-11-07 – 2013-11-08 (×2): 2.5 mg via RESPIRATORY_TRACT
  Filled 2013-11-06 (×2): qty 3

## 2013-11-06 NOTE — H&P (Addendum)
[facility] History and Physical  Tonya Sullivan VPX:[id number] DOB: 09-04-1955 DOA: 11/06/2013  Referring physician: ER physician PCP: Vic Blackbird, MD   Chief Complaint: feeling weak, falls  HPI:  58 year old female with past medical history of small cell lung cancer *(under Dr. Worthy Flank care), receiving chemotherapy, completed cycle 4 of cisplatin/etoposide chemotherapy 10/29/2013, undergoing radiation therapy (so far completed 40 out of 60 planned treatment, treatment complicated on and off with noncompliance and low blood counts) who was in Dr. Unknown Jim office today 11/06/2013 for follow-up visit. She was referred from her office for direct admission for evaluation of progressive weakness and falls. Patient reported feeling weak for past 1 week prior to this admission and had an episode of fall yesterday. She reports not hitting her head but felt pain in her hips and back. She reported that "legs gave out on her". She also reported lightheadedness but no loss of consciousness prior to the fall. Patient reports no neurological deficits such as sensation loss, numbness or tingling or paralysis. No urinary or rectal incontinence. Additionally, patient reports having headaches, on and off for about a same amount of time as her weakness. No chest pain or shortness of breath. No palpitations. No reports of diarrhea or constipation. No reports of blood in the stool or urine.  Assessment & Plan    Principal Problem:   General weakness, falls, headache  Patient has history of chronic back pain however because of medical history of small cell lung cancer concern is for metastatic spread to bones.  Order placed for x-ray of hips, pelvis, lumbar spine. Also obtain CT head.  Pain medications at home include fentanyl patch, lidocaine patch, Percocet. On this admission we ordered fentanyl patch, Percocet. For severe pain we added low dose morphine IV every 2 hours as needed.  Obtain UDS and  alcohol level.  Physical therapy evaluation.  Patient needs all admission labs.  Please note patient has history of memory loss. She is on memantine. I held memantine on the admission. Her mental status seems to be good at this time. Active Problems:   Depression, Anxiety  Continue Elavil and Klonopin  Continue Cymbalta   GERD (gastroesophageal reflux disease)  Resume Protonix   Obesity, morbid  Nutrition consulted   History of adrenal insufficiency  On low-dose prednisone   History of autoimmune thyroiditis  Resume Synthroid 25 mcg daily. Check TSH.   Dyslipidemia  Continue Pravachol   COPD  Resume home inhalers    DVT prophylaxis:   SCDs bilaterally while we are awaiting the admission labs.  Radiological Exams on Admission: No results found.   Code Status: Full Family Communication: Plan of care discussed with the patient  Disposition Plan: Admit for further evaluation  Leisa Lenz, MD  [facility] Pager [phone]  Review of Systems:  Constitutional: Negative for fever, chills and positive formalaise/fatigue. Negative for diaphoresis.  HENT: Negative for hearing loss, ear pain, nosebleeds, congestion, sore throat, neck pain, tinnitus and ear discharge.   Eyes: Negative for blurred vision, double vision, photophobia, pain, discharge and redness.  Respiratory: Negative for cough, hemoptysis, sputum production, shortness of breath, wheezing and stridor.   Cardiovascular: Negative for chest pain, palpitations, orthopnea, claudication and leg swelling.  Gastrointestinal: Negative for nausea, vomiting and abdominal pain. Negative for heartburn, constipation, blood in stool and melena.  Genitourinary: Negative for dysuria, urgency, frequency, hematuria and flank pain.  Musculoskeletal: per history of present illness  Skin: Negative for itching and rash.  Neurological: positive for weakness, falls.  No sensory loss. Endo/Heme/Allergies: Negative for  environmental allergies and polydipsia. Does not bruise/bleed easily.  Psychiatric/Behavioral: Negative for suicidal ideas. The patient is not nervous/anxious.      Past Medical History  Diagnosis Date  . COPD (chronic obstructive pulmonary disease)   . Depression   . Chronic back pain     DDD, disc bulge, radiculopathy, spinal stenosis  . Hyperlipidemia   . Anxiety   . Bipolar disorder, unspecified   . Carotid artery calcification   . DDD (degenerative disc disease), lumbosacral   . Spinal stenosis, lumbar   . Autoimmune thyroiditis   . Essential hypertension, benign   . TIA (transient ischemic attack)     August 2014  . GERD (gastroesophageal reflux disease)   . Neuropathic pain   . Addison's disease     On Prednisone  . History of pneumonia   . Achalasia   . Frequent falls   . DM (diabetes mellitus) with complications   . Hypothyroidism   . Melanoma   . Small cell carcinoma of lung 08/06/2013   Past Surgical History  Procedure Laterality Date  . Appendectomy    . Cholecystectomy    . Lump left breast      Benign  . Abdominal hysterectomy      Tubal pregnancy  . Inguinal hernia repair    . Stomach surgery    . Ectopic pregnancy surgery    . Back surgery      x 5;1984;1989;;1999;2000;2010  . Colonoscopy  01/25/2012    Procedure: COLONOSCOPY;  Surgeon: Rogene Houston, MD;  Location: [facility];  Service: Endoscopy;  Laterality: N/A;  130  . Esophagogastroduodenoscopy (egd) with esophageal dilation N/A 08/13/2012    Procedure: ESOPHAGOGASTRODUODENOSCOPY (EGD) WITH ESOPHAGEAL DILATION;  Surgeon: Rogene Houston, MD;  Location: [facility];  Service: Endoscopy;  Laterality: N/A;  315  . Anterior cervical decomp/discectomy fusion N/A 10/21/2012    Procedure: Cervical Six-Seven Anterior cervical decompression/diskectomy/fusion;  Surgeon: Kristeen Miss, MD;  Location: [facility];  Service: Neurosurgery;  Laterality: N/A;  Cervical Six-Seven Anterior cervical  decompression/diskectomy/fusion  . Cataract extraction w/phaco Right 11/25/2012    Procedure: CATARACT EXTRACTION PHACO AND INTRAOCULAR LENS PLACEMENT (IOC);  Surgeon: Elta Guadeloupe T. Gershon Crane, MD;  Location: [facility];  Service: Ophthalmology;  Laterality: Right;  CDE:10.06  . Cataract extraction w/phaco Left 12/09/2012    Procedure: CATARACT EXTRACTION PHACO AND INTRAOCULAR LENS PLACEMENT (IOC);  Surgeon: Elta Guadeloupe T. Gershon Crane, MD;  Location: [facility];  Service: Ophthalmology;  Laterality: Left;  CDE:5.06  . Tonsillectomy    . Hiatal hernia repair    . Heller myotomy N/A 03/17/2013    Procedure: DIAGNOSTIC LAPAROSCOPY, LAPAROSCOPIC HELLER MYOTOMY, ENDOSCOPY, DOR FUNDOPLICATION;  Surgeon: Ralene Ok, MD;  Location: [facility];  Service: General;  Laterality: N/A;  . Breast surgery      Left breast  . Eye surgery    . Video bronchoscopy with endobronchial ultrasound N/A 07/24/2013    Procedure: VIDEO BRONCHOSCOPY WITH ENDOBRONCHIAL ULTRASOUND;  Surgeon: [name], MD;  Location: [facility];  Service: Thoracic;  Laterality: N/A;   Social History:  reports that she has been smoking Cigarettes and E-cigarettes.  She has a 10 pack-year smoking history. She has never used smokeless tobacco. She reports that she does not drink alcohol or use illicit drugs.  Allergies  Allergen Reactions  . Codeine Itching  . Cyclobenzaprine Hives  . Darvocet [Propoxyphene N-Acetaminophen] Rash  . Abilify [Aripiprazole] Other (See Comments)    tremors  .  Ace Inhibitors Other (See Comments)    unknown  . Adhesive [Tape] Hives  . Gabapentin Swelling  . Iodine Hives    Topical iodine, not allergic to  IV contrast  . Levofloxacin Hives and Hypertension  . Prednisone Hives, Itching and Hypertension    Can't take more than 7.5mg    . Sulfa Antibiotics Itching  . Robaxin [Methocarbamol] Rash    Family History:  Family History  Problem Relation Age of Onset  . Hypertension Mother   . Heart disease Mother   . Heart attack Mother    . Anxiety disorder Mother   . Lung cancer Father   . Heart attack Father   . Alcohol abuse Father   . Hypertension Brother   . Alcohol abuse Brother   . Bipolar disorder Brother   . ADD / ADHD Brother   . Drug abuse Brother   . OCD Brother   . Alcohol abuse Sister   . Bipolar disorder Sister   . Anxiety disorder Sister   . ADD / ADHD Sister   . Drug abuse Sister   . Physical abuse Sister   . Sexual abuse Sister   . Alcohol abuse Brother   . Bipolar disorder Brother   . ADD / ADHD Brother   . Drug abuse Brother   . Alcohol abuse Sister   . Bipolar disorder Sister   . Anxiety disorder Sister   . ADD / ADHD Sister   . Physical abuse Sister   . Sexual abuse Sister   . Alcohol abuse Brother   . Bipolar disorder Brother   . ADD / ADHD Brother   . Bipolar disorder Brother   . ADD / ADHD Brother   . Alcohol abuse Brother   . Paranoid behavior Brother   . Physical abuse Brother   . Sexual abuse Brother   . Bipolar disorder Maternal Aunt   . Bipolar disorder Paternal Aunt   . Bipolar disorder Maternal Uncle   . Bipolar disorder Paternal Uncle   . Bipolar disorder Maternal Grandfather   . Alcohol abuse Maternal Grandfather   . Bipolar disorder Maternal Grandmother   . Alcohol abuse Maternal Grandmother   . Anxiety disorder Maternal Grandmother   . Dementia Maternal Grandmother   . Bipolar disorder Paternal Grandfather   . Alcohol abuse Paternal Grandfather   . Bipolar disorder Paternal Grandmother   . Alcohol abuse Paternal Grandmother   . Drug abuse Paternal Grandmother   . Bipolar disorder Maternal Uncle   . Schizophrenia Neg Hx   . Seizures Neg Hx      Prior to Admission medications   Medication Sig Start Date End Date Taking? Authorizing Provider  albuterol (PROVENTIL HFA;VENTOLIN HFA) 108 (90 BASE) MCG/ACT inhaler Inhale 2 puffs into the lungs 3 (three) times daily as needed for wheezing or shortness of breath.    Historical Provider, MD  amitriptyline (ELAVIL)  100 MG tablet Take 1 tablet (100 mg total) by mouth at bedtime. 11/03/13   Levonne Spiller, MD  clonazePAM (KLONOPIN) 1 MG tablet Take 1 tablet (1 mg total) by mouth 4 (four) times daily. 11/03/13   Levonne Spiller, MD  dexlansoprazole (DEXILANT) 60 MG capsule Take 1 capsule (60 mg total) by mouth daily. 09/04/13   Butch Penny, NP  divalproex (DEPAKOTE ER) 500 MG 24 hr tablet Take 1 tablet (500 mg total) by mouth daily. 11/03/13   Levonne Spiller, MD  donepezil (ARICEPT) 5 MG tablet Take 1 tablet (5 mg total) by mouth  at bedtime. 06/18/13   Alycia Rossetti, MD  DULoxetine (CYMBALTA) 60 MG capsule Take 1 capsule (60 mg total) by mouth every morning. 11/03/13   Levonne Spiller, MD  EPINEPHrine (EPIPEN) 0.3 mg/0.3 mL IJ SOAJ injection Inject 0.3 mLs (0.3 mg total) into the muscle once. PRN FOR SEVERE ALLERGIC REACTION 08/21/13   Rexene Alberts, MD  feeding supplement, ENSURE COMPLETE, (ENSURE COMPLETE) LIQD Take 237 mLs by mouth daily. 09/21/13   Robbie Lis, MD  fentaNYL (DURAGESIC - DOSED MCG/HR) 50 MCG/HR Place 1 patch (50 mcg total) onto the skin every 3 (three) days. 10/26/13   Alycia Rossetti, MD  levothyroxine (SYNTHROID, LEVOTHROID) 25 MCG tablet Take 25 mcg by mouth daily before breakfast.    Historical Provider, MD  lidocaine (LIDODERM) 5 % Place 1 patch onto the skin daily. Remove & Discard patch within 12 hours or as directed by MD    Historical Provider, MD  lidocaine-prilocaine (EMLA) cream Apply 1 application topically as needed. Apply to port 1 hr before chemo 09/25/13   Curt Bears, MD  ondansetron [facility]) 4 MG tablet Take 1 tablet (4 mg total) by mouth every 6 (six) hours as needed for refractory nausea / vomiting. 09/21/13   Robbie Lis, MD  oxyCODONE-acetaminophen (PERCOCET/ROXICET) 5-325 MG per tablet Take 1 tablet by mouth every 6 (six) hours as needed for severe pain. 10/26/13   Maryanna Shape, NP  pantoprazole (PROTONIX) 40 MG tablet TAKE (1) TABLET BY MOUTH DAILY. 10/26/13   Alycia Rossetti, MD  pravastatin (PRAVACHOL) 40 MG tablet Take 40 mg by mouth daily.    Historical Provider, MD  predniSONE (DELTASONE) 2.5 MG tablet Take 3 tablets (7.5 mg total) by mouth daily with breakfast. 05/26/13   Nena Polio, PA-C  prochlorperazine (COMPAZINE) 10 MG tablet Take 1 tablet (10 mg total) by mouth every 6 (six) hours as needed for nausea or vomiting. 10/29/13   Curt Bears, MD  Tiotropium Bromide Monohydrate (SPIRIVA RESPIMAT) 2.5 MCG/ACT AERS Inhale 2 puffs into the lungs daily.    Historical Provider, MD   Physical Exam: Filed Vitals:   11/06/13 1700  BP: 125/65  Pulse: 85  Temp: 97.6 F (36.4 C)  TempSrc: Oral  Resp: 18  Height: 5\' 7"  (1.702 m)  Weight: 91.286 kg (201 lb 4 oz)  SpO2: 100%    Physical Exam  Constitutional: Appears well-developed and well-nourished. No distress.  HENT: Normocephalic. No tonsillar erythema or exudates Eyes: Conjunctivae and EOM are normal. PERRLA, no scleral icterus.  Neck: Normal ROM. Neck supple. No JVD. No tracheal deviation. No thyromegaly.  CVS: RRR, S1/S2 +, no murmurs, no gallops, no carotid bruit.  Pulmonary: Effort and breath sounds normal, no stridor, rhonchi, wheezes, rales.  Abdominal: Soft. BS +,  no distension, tenderness, rebound or guarding.  Musculoskeletal: Normal range of motion. Trace LE pitting edema and no tenderness.  Lymphadenopathy: No lymphadenopathy noted, cervical, inguinal. Neuro: Alert. Normal reflexes, muscle tone coordination. No focal neurologic deficits. Skin: Skin is warm and dry. No rash noted. Not diaphoretic. No erythema. No pallor.  Psychiatric: Normal mood and affect. Behavior, judgment, thought content normal.   Labs on Admission:  Basic Metabolic Panel:  Recent Labs Lab 11/03/13 1426  NA 135*  K 5.2*  CO2 29  GLUCOSE 93  BUN 14.3  CREATININE 0.8  CALCIUM 9.0  MG 2.1   Liver Function Tests:  Recent Labs Lab 11/03/13 1426  AST 10  ALT 9  ALKPHOS 101  BILITOT  0.46  PROT  6.3*  ALBUMIN 3.4*   No results for input(s): LIPASE, AMYLASE in the last 168 hours. No results for input(s): AMMONIA in the last 168 hours. CBC:  Recent Labs Lab 11/03/13 1425  WBC 5.1  NEUTROABS 4.5  HGB 8.3*  HCT 25.5*  MCV 96.8  PLT 110*   Cardiac Enzymes: No results for input(s): CKTOTAL, CKMB, CKMBINDEX, TROPONINI in the last 168 hours. BNP: Invalid input(s): POCBNP CBG: No results for input(s): GLUCAP in the last 168 hours.  If 7PM-7AM, please contact night-coverage [email or web address] Password [email or web address] 11/06/2013, 5:29 PM

## 2013-11-06 NOTE — Progress Notes (Signed)
Pt with small cell lung cancer; needs direct admission to med floor for dehydration. Accepted by Leisa Lenz. TRH [phone]

## 2013-11-06 NOTE — Progress Notes (Signed)
CRITICAL VALUE ALERT  Critical value received:  Hemoglobin 6.8, WBC 0.7, Platelets 26  Date of notification: 11/06/13   Time of notification:  2319  Critical value read back:Yes.    Nurse who received alert:  Noreene Larsson RN, BSN  MD notified (1st page):  Walden Field NP  Time of first page:  2322  Responding MD:  Walden Field NP  Time MD responded:  [phone]

## 2013-11-06 NOTE — Progress Notes (Signed)
   Department of Radiation Oncology  Phone:  [phone] Fax:        [phone]   Name: Tonya Sullivan MRN: [id number]  DOB: 1955-03-18  Date: 11/06/2013  Follow Up Visit Note  Diagnosis: Small cell carcinoma of lung   Primary site: Lung (Right)   Staging method: AJCC 7th Edition   Clinical: Stage IIA (T1a, N1, M0) signed by Curt Bears, MD on 08/06/2013  2:02 PM   Summary: Stage IIA (T1a, N1, M0)  Summary and Interval since last radiation: 3 weeks from completion of 40 (out of a planned 60) Gy, treatment course complicated by compliance and low blood counts.   Interval History: Tonya Sullivan presents today for routine followup.  She is feeling weak and falling at home.  She has been eating poorly (TV dinners, cereal and coffee). She states she has been trying to drink water.  She has noticed a pressure/heavy feeling of her righ eyelid as well. No headaches or fevers. Her cough is stable and non productive. Her last labs were 3 days ago and were Hgb 8.3. She feels worn down.   Physical Exam:  Filed Vitals:   11/06/13 1522 11/06/13 1537  BP: 101/62 102/42  Pulse: 86 143  Temp: 97.2 F (36.2 C)   Weight: 201 lb 4.8 oz (91.309 kg)   SpO2: 99%    No skin changes. Alert and oriented. Pale, EOM intact. Nystagmus over right eye on lateral gaze.   IMPRESSION: Tonya Sullivan is a 58 y.o. female s/p chemoradiation for small cell lung cancer   PLAN:  We discussed IV fluids versus admission. She does not feel safe going home given her dizziness and falls. She is orthostatic and likely anemic.  We discussed that she likely cannot continue living alone and will need full time care either in assisted living or with her son and daughter in law.  She has been resistant to this and is coming to accept this now.  We also discussed her eye symptoms and I recommended that she have an MRI to ensure she has not developed metastatic disease.  Dr. Charlies Silvers has agreed to accept her as a direct admit to the floor.      Thea Silversmith, MD

## 2013-11-06 NOTE — Addendum Note (Signed)
Encounter addended by: Robbie Lis, MD on: 11/06/2013  4:16 PM<BR>     Documentation filed: Charges VN, Clinical Notes

## 2013-11-07 DIAGNOSIS — R531 Weakness: Secondary | ICD-10-CM

## 2013-11-07 DIAGNOSIS — Z8639 Personal history of other endocrine, nutritional and metabolic disease: Secondary | ICD-10-CM

## 2013-11-07 DIAGNOSIS — I959 Hypotension, unspecified: Secondary | ICD-10-CM

## 2013-11-07 DIAGNOSIS — M549 Dorsalgia, unspecified: Secondary | ICD-10-CM

## 2013-11-07 DIAGNOSIS — G8929 Other chronic pain: Secondary | ICD-10-CM

## 2013-11-07 DIAGNOSIS — J439 Emphysema, unspecified: Secondary | ICD-10-CM

## 2013-11-07 DIAGNOSIS — J449 Chronic obstructive pulmonary disease, unspecified: Secondary | ICD-10-CM | POA: Diagnosis present

## 2013-11-07 DIAGNOSIS — E039 Hypothyroidism, unspecified: Secondary | ICD-10-CM

## 2013-11-07 DIAGNOSIS — W19XXXD Unspecified fall, subsequent encounter: Secondary | ICD-10-CM

## 2013-11-07 DIAGNOSIS — C3491 Malignant neoplasm of unspecified part of right bronchus or lung: Secondary | ICD-10-CM

## 2013-11-07 DIAGNOSIS — E2740 Unspecified adrenocortical insufficiency: Secondary | ICD-10-CM

## 2013-11-07 DIAGNOSIS — E785 Hyperlipidemia, unspecified: Secondary | ICD-10-CM | POA: Diagnosis present

## 2013-11-07 DIAGNOSIS — R51 Headache: Secondary | ICD-10-CM

## 2013-11-07 LAB — URINALYSIS, ROUTINE W REFLEX MICROSCOPIC
BILIRUBIN URINE: NEGATIVE
Glucose, UA: NEGATIVE mg/dL
Hgb urine dipstick: NEGATIVE
KETONES UR: NEGATIVE mg/dL
NITRITE: NEGATIVE
PH: 6.5 (ref 5.0–8.0)
PROTEIN: NEGATIVE mg/dL
Specific Gravity, Urine: 1.018 (ref 1.005–1.030)
UROBILINOGEN UA: 1 mg/dL (ref 0.0–1.0)

## 2013-11-07 LAB — COMPREHENSIVE METABOLIC PANEL
ALK PHOS: 92 U/L (ref 39–117)
ALT: 6 U/L (ref 0–35)
AST: 7 U/L (ref 0–37)
Albumin: 2.8 g/dL — ABNORMAL LOW (ref 3.5–5.2)
Anion gap: 8 (ref 5–15)
BILIRUBIN TOTAL: 0.4 mg/dL (ref 0.3–1.2)
BUN: 16 mg/dL (ref 6–23)
CALCIUM: 8.4 mg/dL (ref 8.4–10.5)
CHLORIDE: 103 meq/L (ref 96–112)
CO2: 28 meq/L (ref 19–32)
Creatinine, Ser: 0.81 mg/dL (ref 0.50–1.10)
GFR calc Af Amer: >90 mL/min (ref >90)
GFR calc non Af Amer: 79 mL/min — ABNORMAL LOW (ref >90)
GLUCOSE: 90 mg/dL (ref 70–99)
Potassium: 4.4 mEq/L (ref 3.7–5.3)
SODIUM: 139 meq/L (ref 137–147)
Total Protein: 5.5 g/dL — ABNORMAL LOW (ref 6.0–8.3)

## 2013-11-07 LAB — CBC
HCT: 23 % — ABNORMAL LOW (ref 36.0–46.0)
Hemoglobin: 7.8 g/dL — ABNORMAL LOW (ref 12.0–15.0)
MCH: 32 pg (ref 26.0–34.0)
MCHC: 33.9 g/dL (ref 30.0–36.0)
MCV: 94.3 fL (ref 78.0–100.0)
Platelets: 21 10*3/uL — CL (ref 150–400)
RBC: 2.44 MIL/uL — ABNORMAL LOW (ref 3.87–5.11)
RDW: 17.4 % — AB (ref 11.5–15.5)
WBC: 0.7 10*3/uL — CL (ref 4.0–10.5)

## 2013-11-07 LAB — TSH: TSH: 0.868 u[IU]/mL (ref 0.350–4.500)

## 2013-11-07 LAB — PREPARE RBC (CROSSMATCH)

## 2013-11-07 LAB — RAPID URINE DRUG SCREEN, HOSP PERFORMED
Amphetamines: NOT DETECTED
BARBITURATES: NOT DETECTED
Benzodiazepines: POSITIVE — AB
Cocaine: NOT DETECTED
OPIATES: POSITIVE — AB
TETRAHYDROCANNABINOL: NOT DETECTED

## 2013-11-07 LAB — URINE MICROSCOPIC-ADD ON

## 2013-11-07 MED ORDER — SODIUM CHLORIDE 0.9 % IV SOLN
Freq: Once | INTRAVENOUS | Status: AC
Start: 2013-11-07 — End: 2013-11-07
  Administered 2013-11-07: 09:00:00 via INTRAVENOUS

## 2013-11-07 MED ORDER — TBO-FILGRASTIM 480 MCG/0.8ML ~~LOC~~ SOSY
480.0000 ug | PREFILLED_SYRINGE | Freq: Every day | SUBCUTANEOUS | Status: DC
  Administered 2013-11-07 – 2013-11-08 (×2): 480 ug via SUBCUTANEOUS
  Filled 2013-11-07 (×4): qty 0.8

## 2013-11-07 MED ORDER — DIPHENHYDRAMINE HCL 50 MG/ML IJ SOLN
25.0000 mg | Freq: Once | INTRAMUSCULAR | Status: AC
  Administered 2013-11-07: 25 mg via INTRAVENOUS
  Filled 2013-11-07: qty 1

## 2013-11-07 NOTE — Plan of Care (Signed)
Problem: Phase I Progression Outcomes Goal: Pain controlled with appropriate interventions Outcome: Progressing Goal: OOB as tolerated unless otherwise ordered Outcome: Progressing  Problem: Phase II Progression Outcomes Goal: Vital signs remain stable Outcome: Progressing  Problem: Phase III Progression Outcomes Goal: Pain controlled on oral analgesia Outcome: Progressing

## 2013-11-07 NOTE — Progress Notes (Signed)
A total of 7 MLCs were created for the plan on Ms. Tonya Sullivan which comprise 7 treatment devices. These wer ecreated on 08/12/13.

## 2013-11-07 NOTE — Progress Notes (Signed)
  Radiation Oncology         [phone]) [phone] ________________________________  Name: Tonya Sullivan MRN: [id number]  Date: 08/19/13  DOB: 1955-11-05  Simulation Verification Note  Status: outpatient  NARRATIVE: The patient was brought to the treatment unit and placed in the planned treatment position. The clinical setup was verified. Then port films were obtained and uploaded to the radiation oncology medical record software.  The treatment beams were carefully compared against the planned radiation fields. The position location and shape of the radiation fields was reviewed. The targeted volume of tissue appears appropriately covered by the radiation beams. Organs at risk appear to be excluded as planned.  Based on my personal review, I approved the simulation verification. The patient's treatment will proceed as planned.  ------------------------------------------------  Thea Silversmith, MD

## 2013-11-07 NOTE — Progress Notes (Signed)
CSW received referral for potential New SNF.   CSW reviewed chart and noted that PT evaluation recommends Home Health PT; No PT follow up.  No social work needs identified.   Please re-consult if social work needs arise.  CSW signing off.   Alison Murray, MSW, LCSW Clinical Social Work Weekend coverage [phone]

## 2013-11-07 NOTE — Evaluation (Signed)
Physical Therapy Evaluation Patient Details Name: Tonya Sullivan MRN: [id number] DOB: 1955/12/14 Today's Date: 11/07/2013   History of Present Illness  58 yo female adm 11/06/13 with weakness and falls; PMHx: lung CA, chronic back pain  Clinical Impression  Pt presents with high level balance deficits and hx of falls; will benefit from PT following while in acute setting;  Pt has DME, may need HHPT    Follow Up Recommendations Home health PT;No PT follow up (vs depending on progress)    Equipment Recommendations  None recommended by PT    Recommendations for Other Services       Precautions / Restrictions Precautions Precautions: Fall      Mobility  Bed Mobility Overal bed mobility: Modified Independent                Transfers Overall transfer level: Needs assistance Equipment used: Rolling walker (2 wheeled) Transfers: Sit to/from Stand Sit to Stand: Supervision         General transfer comment: cues for hand placement; stands from varied ht surfaces with supervision for safety  Ambulation/Gait Ambulation/Gait assistance: Min guard Ambulation Distance (Feet): 300 Feet Assistive device: Rolling walker (2 wheeled) Gait Pattern/deviations: Step-through pattern;Decreased stride length;Drifts right/left        Stairs            Wheelchair Mobility    Modified Rankin (Stroke Patients Only)       Balance Overall balance assessment: Needs assistance Sitting-balance support: Feet supported;No upper extremity supported Sitting balance-Leahy Scale: Good     Standing balance support: No upper extremity supported Standing balance-Leahy Scale: Good               High level balance activites: Turns;Head turns High Level Balance Comments: decr balance with narrow BOS             Pertinent Vitals/Pain Pain Assessment: 0-10 Pain Score: 2  Pain Location: back Pain Intervention(s): Monitored during session;Repositioned    Home Living  Family/patient expects to be discharged to:: Private residence Living Arrangements: Alone Available Help at Discharge: Family;Neighbor;Available PRN/intermittently;Personal care attendant Type of Home: Apartment Home Access: Elevator     Home Layout: One level Home Equipment: Grab bars - tub/shower;Shower seat;Toilet riser;Cane - single point;Walker - 4 wheels Additional Comments: aide 60-80 hrs per month, pt unsure    Prior Function Level of Independence: Independent with assistive device(s)         Comments: uses cane but hasn't been  able to walk recently     Hand Dominance        Extremity/Trunk Assessment   Upper Extremity Assessment: Overall WFL for tasks assessed           Lower Extremity Assessment: Overall WFL for tasks assessed         Communication   Communication: No difficulties  Cognition Arousal/Alertness: Awake/alert Behavior During Therapy: Flat affect Overall Cognitive Status: Within Functional Limits for tasks assessed                      General Comments      Exercises        Assessment/Plan    PT Assessment Patient needs continued PT services  PT Diagnosis Difficulty walking   PT Problem List Decreased activity tolerance;Decreased balance;Decreased mobility  PT Treatment Interventions DME instruction;Gait training;Functional mobility training;Therapeutic activities;Balance training;Therapeutic exercise   PT Goals (Current goals can be found in the Care Plan section) Acute Rehab PT Goals Patient Stated Goal:  home PT Goal Formulation: With patient Time For Goal Achievement: 11/14/13 Potential to Achieve Goals: Good    Frequency Min 3X/week   Barriers to discharge        Co-evaluation               End of Session Equipment Utilized During Treatment: Gait belt Activity Tolerance: Patient tolerated treatment well Patient left: in bed;with call bell/phone within reach Nurse Communication: Mobility status          Time: 1425-1446 PT Time Calculation (min): 21 min   Charges:   PT Evaluation $Initial PT Evaluation Tier I: 1 Procedure PT Treatments $Gait Training: 8-22 mins   PT G Codes:          Tonya Sullivan 2013-12-02, 3:02 PM

## 2013-11-07 NOTE — Progress Notes (Signed)
Progress Note   CINDEE MCLESTER CWC:[id number] DOB: 1955/10/23 DOA: 11/06/2013 PCP: Vic Blackbird, MD   Brief Narrative:   Tonya Sullivan is an 58 y.o. female with a PMH of small cell lung cancer status post 4 cycles of cisplatin and etoposide  Chemotherapy was completed 10/29/13 as well as concurrent radiation therapy (40/60 planned treatments) with treatment course complicated by noncompliance and low blood counts, who was admitted 11/06/13 with a chief complaint of weakness and falls.  Assessment/Plan:   Principal Problem:   General weakness / falls / headache in the setting of small cell lung cancer  Radiographic workup completed with findings as noted below.  No fractures to the lumbar spine or hips. CT of the head unremarkable.  Urine drug screen and alcohol levels showed positivity for opiates and benzodiazepines.  Physical therapy evaluation pending.  Active Problems:   Chemotherapy induced pancytopenia  Status post 1 unit of packed red blood cells.  Start Granix.  Continue until [place] greater than 1000.    Depression with anxiety  Continue Elavil and Klonopin. Continue Cymbalta.    Chronic back pain  Continue fentanyl patch and when necessary Percocet. Morphine ordered as needed for severe pain.    GERD (gastroesophageal reflux disease)  Continue PPI therapy.    Obesity, morbid  Dietitian consultation pending.    History of adrenal insufficiency  Continue low-dose prednisone therapy.  BP stable.    History of autoimmune thyroiditis / hypothyroidism  Continue Synthroid.    Dyslipidemia  Continue Pravachol.    COPD  Continue bronchodilators.    DVT Prophylaxis  Continue SCDs.  Code Status: Full. Family Communication: No family at the bedside.  Disposition Plan: Home when stable.   IV Access:    Port-A-Cath   Procedures and diagnostic studies:   Dg Lumbar Spine Complete 11/06/2013: 1. Postsurgical and degenerative changes, stable  from the prior lumbar spine radiographs. 2. No fracture or acute finding.     Dg Hip Bilateral W/pelvis 11/06/2013: No fracture.  No hip joint abnormality.     Ct Head Wo Contrast 11/06/2013: 1. No acute intracranial abnormalities. 2. Mild atrophy.  Stable appearance from the prior study.     Medical Consultants:    None.  Anti-Infectives:    None.  Subjective:    Avory A Boedecker feels a bit better after receiving blood last pm.  No nausea or vomiting.  No fevers or chills.  Has had dyspnea but no cough.  No energy.  Fair appetite. No diarrhea.  No constipation.    Objective:    Filed Vitals:   11/07/13 0141 11/07/13 0212 11/07/13 0426 11/07/13 0528  BP: 94/66 92/60 100/60   Pulse: 92 89 88   Temp: 97.9 F (36.6 C) 97.5 F (36.4 C) 98 F (36.7 C)   TempSrc: Oral Oral Oral   Resp: 12 16 16    Height:      Weight:    91.309 kg (201 lb 4.8 oz)  SpO2: 96% 98% 97%     Intake/Output Summary (Last 24 hours) at 11/07/13 0748 Last data filed at 11/07/13 0600  Gross per 24 hour  Intake 1302.5 ml  Output    400 ml  Net  902.5 ml    Exam: Gen:  NAD Cardiovascular:  RRR, No M/R/G Respiratory:  Lungs CTAB Gastrointestinal:  Abdomen soft, NT/ND, + BS Extremities:  Trace edema, scattered ecchymosis   Data Reviewed:    Labs: Basic Metabolic Panel:  Recent Labs Lab  11/03/13 1426 11/06/13 2215  NA 135* 139  K 5.2* 4.5  CL  --  103  CO2 29 28  GLUCOSE 93 106*  BUN 14.3 19  CREATININE 0.8 0.89  CALCIUM 9.0 8.6  MG 2.1 2.1  PHOS  --  3.2   GFR Estimated Creatinine Clearance: 79.9 mL/min (by C-G formula based on Cr of 0.89). Liver Function Tests:  Recent Labs Lab 11/03/13 1426 11/06/13 2215  AST 10 7  ALT 9 7  ALKPHOS 101 99  BILITOT 0.46 <0.2*  PROT 6.3* 5.8*  ALBUMIN 3.4* 3.1*   Coagulation profile  Recent Labs Lab 11/06/13 2215  INR 0.91    CBC:  Recent Labs Lab 11/03/13 1425 11/06/13 2301  WBC 5.1 0.7*  NEUTROABS 4.5 0.2*  HGB 8.3*  6.8*  HCT 25.5* 20.3*  MCV 96.8 94.9  PLT 110* 26*   BNP (last 3 results)  Recent Labs  08/20/13 0613  PROBNP 1866.0*   Thyroid function studies:  Recent Labs  11/06/13 2215  TSH 0.868   Microbiology No results found for this or any previous visit (from the past 240 hour(s)).   Medications:   . amitriptyline  100 mg Oral QHS  . clonazePAM  1 mg Oral QID  . divalproex  500 mg Oral Daily  . DULoxetine  60 mg Oral q morning - 10a  . feeding supplement (ENSURE COMPLETE)  237 mL Oral Q24H  . fentaNYL  50 mcg Transdermal Q72H  . levothyroxine  25 mcg Oral QHS  . pantoprazole  40 mg Oral QHS  . pravastatin  40 mg Oral Daily  . predniSONE  7.5 mg Oral Q breakfast  . sodium chloride  3 mL Intravenous Q12H  . tiotropium  18 mcg Inhalation Daily   Continuous Infusions: . sodium chloride 75 mL/hr at 11/07/13 0421    Time spent: 35 minutes with > 50% of time discussing current diagnostic test results, clinical impression and plan of care.    LOS: 1 day   Caiya Bettes  [facility] Pager [phone]. If unable to reach me by pager, please call my cell phone at [phone].  *Please refer to [email or web address], password [email or web address] to get updated schedule on who will round on this patient, as hospitalists switch teams weekly. If 7PM-7AM, please contact night-coverage at [email or web address], password [email or web address] for any overnight needs.  11/07/2013, 7:48 AM

## 2013-11-08 DIAGNOSIS — T7840XA Allergy, unspecified, initial encounter: Secondary | ICD-10-CM | POA: Diagnosis not present

## 2013-11-08 LAB — CBC WITH DIFFERENTIAL/PLATELET
Basophils Absolute: 0 10*3/uL (ref 0.0–0.1)
Basophils Relative: 1 % (ref 0–1)
EOS PCT: 1 % (ref 0–5)
Eosinophils Absolute: 0 10*3/uL (ref 0.0–0.7)
HEMATOCRIT: 24.6 % — AB (ref 36.0–46.0)
Hemoglobin: 8.3 g/dL — ABNORMAL LOW (ref 12.0–15.0)
LYMPHS ABS: 0.5 10*3/uL — AB (ref 0.7–4.0)
LYMPHS PCT: 52 % — AB (ref 12–46)
MCH: 31.8 pg (ref 26.0–34.0)
MCHC: 33.7 g/dL (ref 30.0–36.0)
MCV: 94.3 fL (ref 78.0–100.0)
Monocytes Absolute: 0.1 10*3/uL (ref 0.1–1.0)
Monocytes Relative: 9 % (ref 3–12)
Neutro Abs: 0.3 10*3/uL — ABNORMAL LOW (ref 1.7–7.7)
Neutrophils Relative %: 37 % — ABNORMAL LOW (ref 43–77)
PLATELETS: 14 10*3/uL — AB (ref 150–400)
RBC: 2.61 MIL/uL — AB (ref 3.87–5.11)
RDW: 17.1 % — AB (ref 11.5–15.5)
WBC: 0.9 10*3/uL — AB (ref 4.0–10.5)

## 2013-11-08 LAB — BASIC METABOLIC PANEL
ANION GAP: 11 (ref 5–15)
BUN: 7 mg/dL (ref 6–23)
CHLORIDE: 101 meq/L (ref 96–112)
CO2: 27 mEq/L (ref 19–32)
Calcium: 8.9 mg/dL (ref 8.4–10.5)
Creatinine, Ser: 0.71 mg/dL (ref 0.50–1.10)
GFR calc Af Amer: >90 mL/min (ref >90)
GFR calc non Af Amer: >90 mL/min (ref >90)
Glucose, Bld: 88 mg/dL (ref 70–99)
POTASSIUM: 4.4 meq/L (ref 3.7–5.3)
Sodium: 139 mEq/L (ref 137–147)

## 2013-11-08 LAB — GLUCOSE, CAPILLARY: Glucose-Capillary: 98 mg/dL (ref 70–99)

## 2013-11-08 MED ORDER — DIPHENHYDRAMINE-ZINC ACETATE 2-0.1 % EX CREA
TOPICAL_CREAM | Freq: Three times a day (TID) | CUTANEOUS | Status: DC | PRN
  Filled 2013-11-08: qty 28

## 2013-11-08 MED ORDER — ALBUTEROL SULFATE (2.5 MG/3ML) 0.083% IN NEBU
2.5000 mg | INHALATION_SOLUTION | Freq: Three times a day (TID) | RESPIRATORY_TRACT | Status: DC
  Administered 2013-11-09 (×2): 2.5 mg via RESPIRATORY_TRACT
  Filled 2013-11-08 (×2): qty 3

## 2013-11-08 MED ORDER — LIDOCAINE 5 % EX PTCH
1.0000 | MEDICATED_PATCH | CUTANEOUS | Status: DC
  Administered 2013-11-08 – 2013-11-09 (×2): 1 via TRANSDERMAL
  Filled 2013-11-08 (×3): qty 1

## 2013-11-08 MED ORDER — ALBUTEROL SULFATE (2.5 MG/3ML) 0.083% IN NEBU
2.5000 mg | INHALATION_SOLUTION | Freq: Once | RESPIRATORY_TRACT | Status: AC
  Administered 2013-11-08: 2.5 mg via RESPIRATORY_TRACT
  Filled 2013-11-08: qty 3

## 2013-11-08 MED ORDER — DIPHENHYDRAMINE HCL 25 MG PO CAPS
25.0000 mg | ORAL_CAPSULE | Freq: Four times a day (QID) | ORAL | Status: DC | PRN
  Administered 2013-11-08 – 2013-11-10 (×6): 25 mg via ORAL
  Filled 2013-11-08 (×6): qty 1

## 2013-11-08 MED ORDER — ALBUTEROL SULFATE (2.5 MG/3ML) 0.083% IN NEBU
2.5000 mg | INHALATION_SOLUTION | Freq: Four times a day (QID) | RESPIRATORY_TRACT | Status: DC | PRN
  Administered 2013-11-08: 2.5 mg via RESPIRATORY_TRACT
  Filled 2013-11-08: qty 3

## 2013-11-08 MED ORDER — CLONAZEPAM 0.5 MG PO TBDP: 0.5000 mg | ORAL_TABLET | Freq: Two times a day (BID) | ORAL | Status: DC | PRN

## 2013-11-08 MED ORDER — ALPRAZOLAM 0.25 MG PO TABS: 0.2500 mg | ORAL_TABLET | Freq: Three times a day (TID) | ORAL | Status: DC | PRN

## 2013-11-08 MED ORDER — LORATADINE 10 MG PO TABS
10.0000 mg | ORAL_TABLET | Freq: Every day | ORAL | Status: DC
  Administered 2013-11-08 – 2013-11-09 (×2): 10 mg via ORAL
  Filled 2013-11-08 (×3): qty 1

## 2013-11-08 MED ORDER — ALBUTEROL SULFATE (2.5 MG/3ML) 0.083% IN NEBU
2.5000 mg | INHALATION_SOLUTION | RESPIRATORY_TRACT | Status: DC
  Administered 2013-11-08 (×2): 2.5 mg via RESPIRATORY_TRACT
  Filled 2013-11-08 (×2): qty 3

## 2013-11-08 MED ORDER — FAMOTIDINE 20 MG PO TABS
20.0000 mg | ORAL_TABLET | Freq: Two times a day (BID) | ORAL | Status: DC
  Administered 2013-11-08 – 2013-11-09 (×4): 20 mg via ORAL
  Filled 2013-11-08 (×6): qty 1

## 2013-11-08 MED ORDER — ALBUTEROL SULFATE (2.5 MG/3ML) 0.083% IN NEBU
2.5000 mg | INHALATION_SOLUTION | RESPIRATORY_TRACT | Status: DC | PRN
  Administered 2013-11-09 – 2013-11-10 (×3): 2.5 mg via RESPIRATORY_TRACT
  Filled 2013-11-08 (×3): qty 3

## 2013-11-08 MED ORDER — ENSURE COMPLETE PO LIQD
237.0000 mL | Freq: Two times a day (BID) | ORAL | Status: DC
  Administered 2013-11-08 – 2013-11-09 (×3): 237 mL via ORAL

## 2013-11-08 MED ORDER — SODIUM CHLORIDE 0.9 % IV SOLN: Freq: Once | INTRAVENOUS | Status: DC

## 2013-11-08 NOTE — Progress Notes (Addendum)
Progress Note   EMOJEAN GERTZ AJO:[id number] DOB: [birth year] DOA: 11/06/2013 PCP: Vic Blackbird, MD   Brief Narrative:   Tonya Sullivan is an 58 y.o. female with a PMH of small cell lung cancer status post 4 cycles of cisplatin and etoposide  Chemotherapy was completed 10/29/13 as well as concurrent radiation therapy (40/60 planned treatments) with treatment course complicated by noncompliance and low blood counts, who was admitted 11/06/13 with a chief complaint of weakness and falls.  Assessment/Plan:   Principal Problem:   General weakness / falls / headache in the setting of small cell lung cancer  Radiographic workup completed with findings as noted below.  No fractures to the lumbar spine or hips. CT of the head unremarkable.  Weakness likely from underlying cancer/deconditioning/pancytopenia.  Urine drug screen and alcohol levels showed positivity for opiates and benzodiazepines.  Physical therapy evaluation done 11/07/13 with home health PT recommended.  Active Problems:   Allergic reaction  ? Transfusion induced pruritis.  No obvious rash.  Add Claritin and Pepcid.    Chemotherapy induced pancytopenia  Status post 1 unit of packed red blood cells. Hemoglobin stable post transfusion.  Start Granix.  Continue until [place] greater than 1000.  [place] 300 today.  Platelet count markedly depressed. Monitor for signs of bleeding.     Depression with anxiety  Continue Elavil and Klonopin. Continue Cymbalta.  Add Klonopin 0.5 mg BID PRN.    Chronic back pain  Continue fentanyl patch and when necessary Percocet. Morphine ordered as needed for severe pain.    GERD (gastroesophageal reflux disease)  Continue PPI therapy.    Obesity, morbid  Dietitian consultation pending.    History of adrenal insufficiency  Continue low-dose prednisone therapy.  BP stable.    History of autoimmune thyroiditis / hypothyroidism  Continue Synthroid.     Dyslipidemia  Continue Pravachol.    COPD  Continue bronchodilators.    DVT Prophylaxis  Continue SCDs.  Code Status: Full. Family Communication: No family at the bedside.  Disposition Plan: Home when stable.   IV Access:    Port-A-Cath   Procedures and diagnostic studies:   Dg Lumbar Spine Complete 11/06/2013: 1. Postsurgical and degenerative changes, stable from the prior lumbar spine radiographs. 2. No fracture or acute finding.     Dg Hip Bilateral W/pelvis 11/06/2013: No fracture.  No hip joint abnormality.     Ct Head Wo Contrast 11/06/2013: 1. No acute intracranial abnormalities. 2. Mild atrophy.  Stable appearance from the prior study.     Medical Consultants:    None.  Anti-Infectives:    None.  Subjective:   Tonya Sullivan tells me she is still having itchy skin, which started an hour after she received a blood transfusion.  She still feels short of breath and tells me she is "wheezing".  No nausea/vomiting.  Seems anxious.  Reports a "lump" in her left supraclavicular area.   Objective:    Filed Vitals:   11/07/13 0810 11/07/13 1500 11/07/13 2124 11/08/13 0513  BP:  92/49 100/70 121/71  Pulse:  98 97 99  Temp:  98.1 F (36.7 C) 97.8 F (36.6 C) 98.6 F (37 C)  TempSrc:  Oral Oral Oral  Resp:  16 16 16   Height:      Weight:      SpO2: 97% 98% 97% 96%    Intake/Output Summary (Last 24 hours) at 11/08/13 0732 Last data filed at 11/08/13 0516  Gross per 24 hour  Intake    840 ml  Output   3400 ml  Net  -2560 ml    Exam: Gen:  NAD, anxious Cardiovascular:  RRR, No M/R/G Respiratory:  Lungs with a few high pitched wheeze Gastrointestinal:  Abdomen soft, NT/ND, + BS Extremities:  Trace edema, scattered ecchymosis Skin: Mild soft tissue swelling left supraclavicular area without erythema/rash.  Not circumscribed.   Data Reviewed:    Labs: Basic Metabolic Panel:  Recent Labs Lab 11/03/13 1426  11/06/13 2215 11/07/13 0651  11/08/13 0455  NA 135*  --  139 139 139  K 5.2*  < > 4.5 4.4 4.4  CL  --   --  103 103 101  CO2 29  --  28 28 27   GLUCOSE 93  --  106* 90 88  BUN 14.3  --  19 16 7   CREATININE 0.8  --  0.89 0.81 0.71  CALCIUM 9.0  --  8.6 8.4 8.9  MG 2.1  --  2.1  --   --   PHOS  --   --  3.2  --   --   < > = values in this interval not displayed. GFR Estimated Creatinine Clearance: 88.9 mL/min (by C-G formula based on Cr of 0.71). Liver Function Tests:  Recent Labs Lab 11/03/13 1426 11/06/13 2215 11/07/13 0651  AST 10 7 7   ALT 9 7 6   ALKPHOS 101 99 92  BILITOT 0.46 <0.2* 0.4  PROT 6.3* 5.8* 5.5*  ALBUMIN 3.4* 3.1* 2.8*   Coagulation profile  Recent Labs Lab 11/06/13 2215  INR 0.91    CBC:  Recent Labs Lab 11/03/13 1425 11/06/13 2301 11/07/13 0651 11/08/13 0455  WBC 5.1 0.7* 0.7* 0.9*  NEUTROABS 4.5 0.2*  --  0.3*  HGB 8.3* 6.8* 7.8* 8.3*  HCT 25.5* 20.3* 23.0* 24.6*  MCV 96.8 94.9 94.3 94.3  PLT 110* 26* 21* 14*   BNP (last 3 results)  Recent Labs  08/20/13 0613  PROBNP 1866.0*   Thyroid function studies:  Recent Labs  11/06/13 2215  TSH 0.868   Microbiology No results found for this or any previous visit (from the past 240 hour(s)).   Medications:   . amitriptyline  100 mg Oral QHS  . clonazePAM  1 mg Oral QID  . divalproex  500 mg Oral Daily  . DULoxetine  60 mg Oral q morning - 10a  . feeding supplement (ENSURE COMPLETE)  237 mL Oral Q24H  . fentaNYL  50 mcg Transdermal Q72H  . levothyroxine  25 mcg Oral QHS  . pantoprazole  40 mg Oral QHS  . pravastatin  40 mg Oral Daily  . predniSONE  7.5 mg Oral Q breakfast  . sodium chloride  3 mL Intravenous Q12H  . Tbo-filgastrim (GRANIX) SQ  480 mcg Subcutaneous q1800  . tiotropium  18 mcg Inhalation Daily   Continuous Infusions:    Time spent: 25 minutes.    LOS: 2 days   Tonya Sullivan  [facility] Pager [phone]. If unable to reach me by pager, please call my cell phone at  [phone].  *Please refer to [email or web address], password [email or web address] to get updated schedule on who will round on this patient, as hospitalists switch teams weekly. If 7PM-7AM, please contact night-coverage at [email or web address], password [email or web address] for any overnight needs.  11/08/2013, 7:32 AM

## 2013-11-08 NOTE — Progress Notes (Signed)
INITIAL NUTRITION ASSESSMENT  DOCUMENTATION CODES Per approved criteria  -Obesity Unspecified   INTERVENTION:  Provide Ensure Complete po BID, each supplement provides 350 kcal and 13 grams of protein  Encourage PO intake  RD to continue to monitor  NUTRITION DIAGNOSIS: Increased nutrient (protein) needs related to lung cancer as evidenced by estimated nutritional needs.   Goal: Pt to meet >/= 90% of their estimated nutrition needs   Monitor:  PO and supplemental intake, weight, labs, I/O's  Reason for Assessment: Diet education   Admitting Dx: General weakness  ASSESSMENT: 58 y.o. female with a PMH of small cell lung cancer status post 4 cycles of cisplatin and etoposide Chemotherapy was completed 10/29/13 as well as concurrent radiation therapy (40/60 planned treatments).  Pt is receiving Ensure Complete po daily, each supplement provides 350 kcal and 13 grams of protein. Pt states that she is drinking the supplement. Would like to receive more between meals.  Pt reports no weight changes. Pt states that her appetite was not good PTA. Currently pt is eating well. PO intake 100%.  Encouraged pt to continue to eat regular balanced meals, emphasizing protein consumption. Discouraged skipping meals. Pt with no questions at this time. Pt denies nausea/vomiting and swallowing issues at this time.  Nutrition focused physical exam shows no sign of depletion of muscle mass or body fat.  Labs reviewed.  Height: Ht Readings from Last 1 Encounters:  11/06/13 5\' 7"  (1.702 m)    Weight: Wt Readings from Last 1 Encounters:  11/07/13 201 lb 4.8 oz (91.309 kg)    Ideal Body Weight: 135 lb  % Ideal Body Weight: 149%  Wt Readings from Last 10 Encounters:  11/07/13 201 lb 4.8 oz (91.309 kg)  11/06/13 201 lb 4.8 oz (91.309 kg)  11/03/13 199 lb 9.6 oz (90.538 kg)  10/26/13 197 lb 1.6 oz (89.404 kg)  10/22/13 199 lb 6.4 oz (90.447 kg)  10/15/13 194 lb (87.998 kg)  10/14/13  195 lb 14.4 oz (88.86 kg)  10/13/13 196 lb 3.2 oz (88.996 kg)  10/13/13 195 lb (88.451 kg)  10/06/13 202 lb 1.6 oz (91.672 kg)    Usual Body Weight: 200 lb  % Usual Body Weight: 101%  BMI:  Body mass index is 31.52 kg/(m^2).  Estimated Nutritional Needs: Kcal: 1700-1900 Protein: 75-85g Fluid: 1.7L/day  Skin: abrasion on arms  Diet Order: Diet regular  EDUCATION NEEDS: -Education needs addressed   Intake/Output Summary (Last 24 hours) at 11/08/13 1237 Last data filed at 11/08/13 0516  Gross per 24 hour  Intake    480 ml  Output   3400 ml  Net  -2920 ml    Last BM: 11/5   Labs:   Recent Labs Lab 11/03/13 1426 11/06/13 2215 11/07/13 0651 11/08/13 0455  NA 135* 139 139 139  K 5.2* 4.5 4.4 4.4  CL  --  103 103 101  CO2 29 28 28 27   BUN 14.3 19 16 7   CREATININE 0.8 0.89 0.81 0.71  CALCIUM 9.0 8.6 8.4 8.9  MG 2.1 2.1  --   --   PHOS  --  3.2  --   --   GLUCOSE 93 106* 90 88    CBG (last 3)   Recent Labs  11/08/13 0737  GLUCAP 98    Scheduled Meds: . albuterol  2.5 mg Inhalation Q4H  . amitriptyline  100 mg Oral QHS  . clonazePAM  1 mg Oral QID  . divalproex  500 mg Oral Daily  .  DULoxetine  60 mg Oral q morning - 10a  . famotidine  20 mg Oral BID  . feeding supplement (ENSURE COMPLETE)  237 mL Oral Q24H  . fentaNYL  50 mcg Transdermal Q72H  . levothyroxine  25 mcg Oral QHS  . loratadine  10 mg Oral Daily  . pantoprazole  40 mg Oral QHS  . pravastatin  40 mg Oral Daily  . predniSONE  7.5 mg Oral Q breakfast  . sodium chloride  3 mL Intravenous Q12H  . Tbo-filgastrim (GRANIX) SQ  480 mcg Subcutaneous q1800  . tiotropium  18 mcg Inhalation Daily    Continuous Infusions:   Past Medical History  Diagnosis Date  . COPD (chronic obstructive pulmonary disease)   . Depression   . Chronic back pain     DDD, disc bulge, radiculopathy, spinal stenosis  . Hyperlipidemia   . Anxiety   . Bipolar disorder, unspecified   . Carotid artery  calcification   . DDD (degenerative disc disease), lumbosacral   . Spinal stenosis, lumbar   . Autoimmune thyroiditis   . Essential hypertension, benign   . TIA (transient ischemic attack)     August 2014  . GERD (gastroesophageal reflux disease)   . Neuropathic pain   . Addison's disease     On Prednisone  . History of pneumonia   . Achalasia   . Frequent falls   . DM (diabetes mellitus) with complications   . Hypothyroidism   . Melanoma   . 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 Appendectomy    . Cholecystectomy    . Lump left breast      Benign  . Abdominal hysterectomy      Tubal pregnancy  . Inguinal hernia repair    . Stomach surgery    . Ectopic pregnancy surgery    . Back surgery      x 5;1984;1989;;1999;2000;2010  . Colonoscopy  01/25/2012    Procedure: COLONOSCOPY;  Surgeon: Rogene Houston, MD;  Location: [facility];  Service: Endoscopy;  Laterality: N/A;  130  . Esophagogastroduodenoscopy (egd) with esophageal dilation N/A 08/13/2012    Procedure: ESOPHAGOGASTRODUODENOSCOPY (EGD) WITH ESOPHAGEAL DILATION;  Surgeon: Rogene Houston, MD;  Location: [facility];  Service: Endoscopy;  Laterality: N/A;  315  . Anterior cervical decomp/discectomy fusion N/A 10/21/2012    Procedure: Cervical Six-Seven Anterior cervical decompression/diskectomy/fusion;  Surgeon: Kristeen Miss, MD;  Location: [facility];  Service: Neurosurgery;  Laterality: N/A;  Cervical Six-Seven Anterior cervical decompression/diskectomy/fusion  . Cataract extraction w/phaco Right 11/25/2012    Procedure: CATARACT EXTRACTION PHACO AND INTRAOCULAR LENS PLACEMENT (IOC);  Surgeon: Elta Guadeloupe T. Gershon Crane, MD;  Location: [facility];  Service: Ophthalmology;  Laterality: Right;  CDE:10.06  . Cataract extraction w/phaco Left 12/09/2012    Procedure: CATARACT EXTRACTION PHACO AND INTRAOCULAR LENS PLACEMENT (IOC);  Surgeon: Elta Guadeloupe T. Gershon Crane, MD;  Location: [facility];   Service: Ophthalmology;  Laterality: Left;  CDE:5.06  . Tonsillectomy    . Hiatal hernia repair    . Heller myotomy N/A 03/17/2013    Procedure: DIAGNOSTIC LAPAROSCOPY, LAPAROSCOPIC HELLER MYOTOMY, ENDOSCOPY, DOR FUNDOPLICATION;  Surgeon: Ralene Ok, MD;  Location: [facility];  Service: General;  Laterality: N/A;  . Breast surgery      Left breast  . Eye surgery    . Video bronchoscopy with endobronchial ultrasound N/A 07/24/2013    Procedure: VIDEO BRONCHOSCOPY WITH ENDOBRONCHIAL ULTRASOUND;  Surgeon: Fernande Boyden  Cyndia Bent, MD;  Location: [facility];  Service: Thoracic;  Laterality: N/A;    Clayton Bibles, MS, RD, LDN Pager: [phone] After Hours Pager: [phone]

## 2013-11-08 NOTE — Progress Notes (Signed)
Patient respiratory protocol done and patient scored a 7. Treatments changed to TId and prn. patiet takes at home as needed but tid was a good for now. BBS heard with rhonchi throughout and diminished in the bases. No xray to compare since 9-16. RT will continue to monitor.

## 2013-11-08 NOTE — Plan of Care (Signed)
Problem: Phase I Progression Outcomes Goal: Pain controlled with appropriate interventions Outcome: Progressing Goal: OOB as tolerated unless otherwise ordered Outcome: Progressing  Problem: Phase II Progression Outcomes Goal: Progress activity as tolerated unless otherwise ordered Outcome: Progressing

## 2013-11-09 ENCOUNTER — Encounter: Payer: Self-pay | Admitting: *Deleted

## 2013-11-09 ENCOUNTER — Inpatient Hospital Stay (HOSPITAL_COMMUNITY): Payer: Medicare Other

## 2013-11-09 DIAGNOSIS — T7840XD Allergy, unspecified, subsequent encounter: Secondary | ICD-10-CM

## 2013-11-09 DIAGNOSIS — J441 Chronic obstructive pulmonary disease with (acute) exacerbation: Secondary | ICD-10-CM | POA: Diagnosis present

## 2013-11-09 LAB — BASIC METABOLIC PANEL
ANION GAP: 12 (ref 5–15)
BUN: 7 mg/dL (ref 6–23)
CO2: 28 mEq/L (ref 19–32)
CREATININE: 0.72 mg/dL (ref 0.50–1.10)
Calcium: 8.9 mg/dL (ref 8.4–10.5)
Chloride: 99 mEq/L (ref 96–112)
Glucose, Bld: 100 mg/dL — ABNORMAL HIGH (ref 70–99)
Potassium: 4.2 mEq/L (ref 3.7–5.3)
SODIUM: 139 meq/L (ref 137–147)

## 2013-11-09 LAB — CBC WITH DIFFERENTIAL/PLATELET
BASOS ABS: 0 10*3/uL (ref 0.0–0.1)
Basophils Relative: 0 % (ref 0–1)
EOS ABS: 0.1 10*3/uL (ref 0.0–0.7)
Eosinophils Relative: 2 % (ref 0–5)
HCT: 22.8 % — ABNORMAL LOW (ref 36.0–46.0)
Hemoglobin: 7.6 g/dL — ABNORMAL LOW (ref 12.0–15.0)
LYMPHS ABS: 0.7 10*3/uL (ref 0.7–4.0)
Lymphocytes Relative: 26 % (ref 12–46)
MCH: 32.1 pg (ref 26.0–34.0)
MCHC: 33.3 g/dL (ref 30.0–36.0)
MCV: 96.2 fL (ref 78.0–100.0)
MONO ABS: 0.3 10*3/uL (ref 0.1–1.0)
Monocytes Relative: 10 % (ref 3–12)
Neutro Abs: 1.6 10*3/uL — ABNORMAL LOW (ref 1.7–7.7)
Neutrophils Relative %: 62 % (ref 43–77)
PLATELETS: 14 10*3/uL — AB (ref 150–400)
RBC: 2.37 MIL/uL — ABNORMAL LOW (ref 3.87–5.11)
RDW: 17.6 % — AB (ref 11.5–15.5)
WBC: 2.7 10*3/uL — ABNORMAL LOW (ref 4.0–10.5)

## 2013-11-09 LAB — GLUCOSE, CAPILLARY
GLUCOSE-CAPILLARY: 126 mg/dL — AB (ref 70–99)
GLUCOSE-CAPILLARY: 86 mg/dL (ref 70–99)

## 2013-11-09 MED ORDER — DEXTROSE 5 % IV SOLN
1.0000 g | INTRAVENOUS | Status: DC
  Administered 2013-11-09: 1 g via INTRAVENOUS
  Filled 2013-11-09 (×2): qty 10

## 2013-11-09 MED ORDER — GUAIFENESIN ER 600 MG PO TB12
600.0000 mg | ORAL_TABLET | Freq: Two times a day (BID) | ORAL | Status: DC | PRN
  Administered 2013-11-09: 600 mg via ORAL
  Filled 2013-11-09 (×2): qty 1

## 2013-11-09 MED ORDER — DEXTROSE 5 % IV SOLN
500.0000 mg | INTRAVENOUS | Status: DC
  Administered 2013-11-09: 500 mg via INTRAVENOUS
  Filled 2013-11-09 (×2): qty 500

## 2013-11-09 NOTE — Progress Notes (Addendum)
Progress Note   Tonya Sullivan EXN:[id number] DOB: 08-07-1955 DOA: 11/06/2013 PCP: Vic Blackbird, MD   Brief Narrative:   Tonya Sullivan is an 58 y.o. female with a PMH of small cell lung cancer status post 4 cycles of cisplatin and etoposide  Chemotherapy was completed 10/29/13 as well as concurrent radiation therapy (40/60 planned treatments) with treatment course complicated by noncompliance and low blood counts, who was admitted 11/06/13 with a chief complaint of weakness and falls.  She is currently pancytopenic and deconditioned which is likely contributing to her weakness.  Assessment/Plan:   Principal Problem:   General weakness / falls / headache in the setting of small cell lung cancer  Radiographic workup completed with findings as noted below.  No fractures to the lumbar spine or hips. CT of the head unremarkable.  Weakness likely from underlying cancer/deconditioning/pancytopenia.  Urine drug screen and alcohol levels showed positivity for opiates and benzodiazepines.  Physical therapy evaluation done 11/07/13 with home health PT recommended.  Active Problems:   Allergic reaction  ? Transfusion induced pruritis.  No obvious rash.  Continue Claritin and Pepcid.    Chemotherapy induced pancytopenia  Status post 1 unit of packed red blood cells. Hemoglobin stable post transfusion.  Treated with Granix until [place] greater than 1000.  ANC 1600 today.  Platelet count markedly depressed. Monitor for signs of bleeding.     Depression with anxiety  Continue Elavil and Klonopin. Continue Cymbalta.  Add Klonopin 0.5 mg BID PRN.    Chronic back pain  Continue fentanyl patch and when necessary Percocet. Morphine ordered as needed for severe pain.    GERD (gastroesophageal reflux disease)  Continue PPI therapy.    Obesity, morbid  Evaluated by dietitian on 11/08/13.    History of adrenal insufficiency  Continue low-dose prednisone therapy.  BP stable.     History of autoimmune thyroiditis / hypothyroidism  Continue Synthroid.    Dyslipidemia  Continue Pravachol.    COPD exacerbation  Continue bronchodilators.  Refuses to let me increase her steroids despite worsening wheezing.  Will add Levaquin given that she is immunosuppressed.  Check CXR.    DVT Prophylaxis  Continue SCDs.  Code Status: Full. Family Communication: No family at the bedside.  Disposition Plan: Home when stable.   IV Access:    Port-A-Cath   Procedures and diagnostic studies:   Dg Lumbar Spine Complete 11/06/2013: 1. Postsurgical and degenerative changes, stable from the prior lumbar spine radiographs. 2. No fracture or acute finding.     Dg Hip Bilateral W/pelvis 11/06/2013: No fracture.  No hip joint abnormality.     Ct Head Wo Contrast 11/06/2013: 1. No acute intracranial abnormalities. 2. Mild atrophy.  Stable appearance from the prior study.     Medical Consultants:    None.  Anti-Infectives:    Rocephin 11/09/13--->  Azithromycin 11/09/13--->  Subjective:   Tonya Sullivan tells me she is still having itchy skin, despite treatment with benadryl/pepcid.  She still feels short of breath and tells me she is "wheezing".  No nausea/vomiting.  Continues to be anxious.  Reports a "lump" in her left supraclavicular area.  No bleeding.   Objective:    Filed Vitals:   11/08/13 2046 11/09/13 0005 11/09/13 0550 11/09/13 0603  BP: 115/69  111/65   Pulse: 97 101 98   Temp: 98.3 F (36.8 C)  98.3 F (36.8 C)   TempSrc: Oral  Oral   Resp: 16 20 16    Height:  Weight:      SpO2: 98%  100% 99%    Intake/Output Summary (Last 24 hours) at 11/09/13 0724 Last data filed at 11/08/13 1300  Gross per 24 hour  Intake    240 ml  Output    900 ml  Net   -660 ml    Exam: Gen:  NAD Cardiovascular:  RRR, No M/R/G Respiratory:  Lungs with increased wheeze Gastrointestinal:  Abdomen soft, NT/ND, + BS Extremities:  Trace edema, scattered  ecchymosis Skin: Mild soft tissue swelling left supraclavicular area without erythema/rash.  Not circumscribed.   Data Reviewed:    Labs: Basic Metabolic Panel:  Recent Labs Lab 11/03/13 1426  11/06/13 2215 11/07/13 0651 11/08/13 0455 11/09/13 0500  NA 135*  --  139 139 139 139  K 5.2*  < > 4.5 4.4 4.4 4.2  CL  --   --  103 103 101 99  CO2 29  --  28 28 27 28   GLUCOSE 93  --  106* 90 88 100*  BUN 14.3  --  19 16 7 7   CREATININE 0.8  --  0.89 0.81 0.71 0.72  CALCIUM 9.0  --  8.6 8.4 8.9 8.9  MG 2.1  --  2.1  --   --   --   PHOS  --   --  3.2  --   --   --   < > = values in this interval not displayed. GFR Estimated Creatinine Clearance: 88.9 mL/min (by C-G formula based on Cr of 0.72). Liver Function Tests:  Recent Labs Lab 11/03/13 1426 11/06/13 2215 11/07/13 0651  AST 10 7 7   ALT 9 7 6   ALKPHOS 101 99 92  BILITOT 0.46 <0.2* 0.4  PROT 6.3* 5.8* 5.5*  ALBUMIN 3.4* 3.1* 2.8*   Coagulation profile  Recent Labs Lab 11/06/13 2215  INR 0.91    CBC:  Recent Labs Lab 11/03/13 1425 11/06/13 2301 11/07/13 0651 11/08/13 0455 11/09/13 0500  WBC 5.1 0.7* 0.7* 0.9* 2.7*  NEUTROABS 4.5 0.2*  --  0.3* 1.6*  HGB 8.3* 6.8* 7.8* 8.3* 7.6*  HCT 25.5* 20.3* 23.0* 24.6* 22.8*  MCV 96.8 94.9 94.3 94.3 96.2  PLT 110* 26* 21* 14* 14*   BNP (last 3 results)  Recent Labs  08/20/13 0613  PROBNP 1866.0*   Thyroid function studies:  Recent Labs  11/06/13 2215  TSH 0.868   Microbiology No results found for this or any previous visit (from the past 240 hour(s)).   Medications:   . albuterol  2.5 mg Inhalation TID  . amitriptyline  100 mg Oral QHS  . clonazePAM  1 mg Oral QID  . divalproex  500 mg Oral Daily  . DULoxetine  60 mg Oral q morning - 10a  . famotidine  20 mg Oral BID  . feeding supplement (ENSURE COMPLETE)  237 mL Oral BID BM  . fentaNYL  50 mcg Transdermal Q72H  . levothyroxine  25 mcg Oral QHS  . lidocaine  1 patch Transdermal Q24H  .  loratadine  10 mg Oral Daily  . pantoprazole  40 mg Oral QHS  . pravastatin  40 mg Oral Daily  . predniSONE  7.5 mg Oral Q breakfast  . sodium chloride  3 mL Intravenous Q12H  . Tbo-filgastrim (GRANIX) SQ  480 mcg Subcutaneous q1800  . tiotropium  18 mcg Inhalation Daily   Continuous Infusions:    Time spent: 25 minutes.    LOS: 3 days   Kaled Allende  [facility] Pager [phone]. If unable to reach me by pager, please call my cell phone at [phone].  *Please refer to [email or web address], password [email or web address] to get updated schedule on who will round on this patient, as hospitalists switch teams weekly. If 7PM-7AM, please contact night-coverage at [email or web address], password [email or web address] for any overnight needs.  11/09/2013, 7:24 AM

## 2013-11-09 NOTE — Progress Notes (Signed)
Received an in-basket message from Dr. Pablo Ledger in which Dr. Julien Nordmann was added too.  I followed up with Dr. Julien Nordmann. No orders at this time.

## 2013-11-09 NOTE — Care Management Note (Signed)
CARE MANAGEMENT NOTE 11/09/2013  Patient:  Tonya Sullivan, Tonya Sullivan   Account Number:  [id number]  Date Initiated:  11/09/2013  Documentation initiated by:  Marney Doctor  Subjective/Objective Assessment:   58 yo admitted with General weakness.  PMH of small cell lung cancer.     Action/Plan:   From home alone.   Anticipated DC Date:  11/12/2013   Anticipated DC Plan:  [place]  CM consult      Choice offered to / List presented to:             Status of service:  In process, will continue to follow Medicare Important Message given?   (If response is "NO", the following Medicare IM given date fields will be blank) Date Medicare IM given:   Medicare IM given by:   Date Additional Medicare IM given:   Additional Medicare IM given by:    Discharge Disposition:    Per UR Regulation:  Reviewed for med. necessity/level of care/duration of stay  If discussed at [place] of Stay Meetings, dates discussed:    Comments:  11/09/13 Marney Doctor RN,BSN,NCM [phone] Chart reviewed and CM following for DC needs.  PT is recommending HHPT vs no PT follow up depending on progress. CM will monitor PT notes for progress and assist with DC planning as needed.

## 2013-11-09 NOTE — Plan of Care (Signed)
Problem: Consults Goal: General Medical Patient Education See Patient Education Module for specific education.  Outcome: Completed/Met Date Met:  11/09/13  Problem: Phase I Progression Outcomes Goal: Pain controlled with appropriate interventions Outcome: Progressing

## 2013-11-10 ENCOUNTER — Ambulatory Visit: Payer: Self-pay | Admitting: Cardiology

## 2013-11-10 ENCOUNTER — Other Ambulatory Visit: Payer: Self-pay

## 2013-11-10 ENCOUNTER — Ambulatory Visit (HOSPITAL_COMMUNITY): Admission: RE | Admit: 2013-11-10 | Payer: Medicare Other | Source: Ambulatory Visit

## 2013-11-10 DIAGNOSIS — D696 Thrombocytopenia, unspecified: Secondary | ICD-10-CM

## 2013-11-10 DIAGNOSIS — C3490 Malignant neoplasm of unspecified part of unspecified bronchus or lung: Secondary | ICD-10-CM

## 2013-11-10 DIAGNOSIS — R5383 Other fatigue: Secondary | ICD-10-CM

## 2013-11-10 DIAGNOSIS — C3411 Malignant neoplasm of upper lobe, right bronchus or lung: Secondary | ICD-10-CM

## 2013-11-10 DIAGNOSIS — D61818 Other pancytopenia: Secondary | ICD-10-CM

## 2013-11-10 DIAGNOSIS — J438 Other emphysema: Secondary | ICD-10-CM

## 2013-11-10 LAB — CBC WITH DIFFERENTIAL/PLATELET
Basophils Absolute: 0 10*3/uL (ref 0.0–0.1)
Basophils Relative: 0 % (ref 0–1)
EOS ABS: 0.1 10*3/uL (ref 0.0–0.7)
Eosinophils Relative: 1 % (ref 0–5)
HCT: 23.8 % — ABNORMAL LOW (ref 36.0–46.0)
Hemoglobin: 8 g/dL — ABNORMAL LOW (ref 12.0–15.0)
LYMPHS PCT: 17 % (ref 12–46)
Lymphs Abs: 1.3 10*3/uL (ref 0.7–4.0)
MCH: 32.7 pg (ref 26.0–34.0)
MCHC: 33.6 g/dL (ref 30.0–36.0)
MCV: 97.1 fL (ref 78.0–100.0)
Monocytes Absolute: 0.9 10*3/uL (ref 0.1–1.0)
Monocytes Relative: 12 % (ref 3–12)
NEUTROS PCT: 70 % (ref 43–77)
Neutro Abs: 5.2 10*3/uL (ref 1.7–7.7)
Platelets: 20 10*3/uL — CL (ref 150–400)
RBC: 2.45 MIL/uL — ABNORMAL LOW (ref 3.87–5.11)
RDW: 17.6 % — AB (ref 11.5–15.5)
WBC: 7.5 10*3/uL (ref 4.0–10.5)

## 2013-11-10 LAB — TYPE AND SCREEN
ABO/RH(D): A POS
ANTIBODY SCREEN: NEGATIVE
Unit division: 0
Unit division: 0

## 2013-11-10 LAB — GLUCOSE, CAPILLARY: GLUCOSE-CAPILLARY: 93 mg/dL (ref 70–99)

## 2013-11-10 MED ORDER — VITAMINS A & D EX OINT
TOPICAL_OINTMENT | CUTANEOUS | Status: AC
  Administered 2013-11-10: 5
  Filled 2013-11-10: qty 5

## 2013-11-10 MED ORDER — HEPARIN SOD (PORK) LOCK FLUSH 100 UNIT/ML IV SOLN
500.0000 [IU] | INTRAVENOUS | Status: AC | PRN
  Administered 2013-11-10: 500 [IU]
  Filled 2013-11-10: qty 5

## 2013-11-10 MED ORDER — LORATADINE 10 MG PO TABS: 10.0000 mg | ORAL_TABLET | Freq: Every day | ORAL | Status: AC

## 2013-11-10 MED ORDER — DIPHENHYDRAMINE-ZINC ACETATE 2-0.1 % EX CREA: TOPICAL_CREAM | Freq: Three times a day (TID) | CUTANEOUS | Status: AC | PRN

## 2013-11-10 MED ORDER — CEFPODOXIME PROXETIL 200 MG PO TABS: 200.0000 mg | ORAL_TABLET | Freq: Two times a day (BID) | ORAL | Status: DC

## 2013-11-10 MED ORDER — ALBUTEROL SULFATE HFA 108 (90 BASE) MCG/ACT IN AERS: 2.0000 | INHALATION_SPRAY | Freq: Three times a day (TID) | RESPIRATORY_TRACT | Status: AC | PRN

## 2013-11-10 MED ORDER — ONDANSETRON HCL 4 MG PO TABS: 4.0000 mg | ORAL_TABLET | Freq: Four times a day (QID) | ORAL | Status: DC | PRN

## 2013-11-10 MED ORDER — OXYCODONE-ACETAMINOPHEN 5-325 MG PO TABS: 1.0000 | ORAL_TABLET | Freq: Four times a day (QID) | ORAL | Status: DC | PRN

## 2013-11-10 MED ORDER — ALBUTEROL SULFATE (2.5 MG/3ML) 0.083% IN NEBU: 2.5000 mg | INHALATION_SOLUTION | RESPIRATORY_TRACT | Status: AC | PRN

## 2013-11-10 MED ORDER — GUAIFENESIN ER 600 MG PO TB12: 600.0000 mg | ORAL_TABLET | Freq: Two times a day (BID) | ORAL | Status: DC | PRN

## 2013-11-10 NOTE — Discharge Summary (Signed)
Physician Discharge Summary  Tonya Sullivan IZT:[id number] DOB: 12-20-1955 DOA: 11/06/2013  PCP: Vic Blackbird, MD  Admit date: 11/06/2013 Discharge date: 11/10/2013  Recommendations for Outpatient Follow-up:  The office of your PCP will call you and schedule an appt with you for after this hospitalization follow up. Please have them repeat blood work during that appt. Appt with Dr.Mohamed is scheduled for 11/17/2013 at 8:30 am Continue cefpodoxime for 8 days on discharge for treatment of possible pneumonia. This would complete a total of 10 days treatment with antibiotics.  Discharge Diagnoses:  Principal Problem:   General weakness Active Problems:   Falls   Depression   Chronic back pain   GERD (gastroesophageal reflux disease)   Obesity, morbid   Small cell lung cancer   Headache   History of adrenal insufficiency   Hypothyroidism   Dyslipidemia   COPD (chronic obstructive pulmonary disease)   Allergic reaction   COPD exacerbation    Discharge Condition: stable   Diet recommendation: as tolerated   History of present illness:  58 y.o. female with a PMH of small cell lung cancer status post 4 cycles of cisplatin and etoposide Chemotherapy was completed 10/29/13 as well as concurrent radiation therapy (40/60 planned treatments) with treatment course complicated by noncompliance and low blood counts, admitted 11/06/13 with weakness and falls. She is currently pancytopenic and deconditioned which is likely contributing to her weakness.  Assessment/Plan:   Principal Problem:  General weakness / falls / headache in the setting of small cell lung cancer  Radiographic workup completed with findings as noted below. No fractures to the lumbar spine or hips. CT of the head unremarkable. Weakness likely from underlying cancer/deconditioning/pancytopenia.  Urine drug screen and alcohol levels showed positivity for opiates and benzodiazepines.  Physical therapy  evaluation done 11/07/13 with home health PT recommended.  Active Problems:  Allergic reaction  ? Transfusion induced pruritis. No obvious rash.  Feels better  Continue Claritin and protonix.    Chemotherapy induced pancytopenia  Status post 1 unit of packed red blood cells. Hemoglobin stable post transfusion.  Treated with Granix until [place] greater than 1000. ANC trending up 1600 --> 5600  Platelet count markedly depressed on admission but now WNL   Depression with anxiety  Continue Elavil and Klonopin. Continue Cymbalta.   Chronic back pain  Continue fentanyl patch and when necessary Percocet. Morphine ordered as needed for severe pain while pt is in hospital.    GERD (gastroesophageal reflux disease)  Continue PPI therapy.   Obesity, morbid  Evaluated by dietitian on 11/08/13.   History of adrenal insufficiency  Continue low-dose prednisone therapy.   History of autoimmune thyroiditis / hypothyroidism  Continue Synthroid.   Dyslipidemia  Continue Pravachol.   COPD exacerbation  Continue bronchodilators.  CXR 11/09 with mid right lung atelectasis, no signs of PNA  Continue cefpodoxime on discharge for 8 more days    DVT Prophylaxis  Continue SCDs.  Code Status: Full. Family Communication: No family at the bedside.    IV Access:    Port-A-Cath  Procedures and diagnostic studies:   Dg Lumbar Spine Complete 11/06/2013: 1. Postsurgical and degenerative changes, stable from the prior lumbar spine radiographs. 2. No fracture or acute finding.   Dg Hip Bilateral W/pelvis 11/06/2013: No fracture. No hip joint abnormality.   Ct Head Wo Contrast 11/06/2013: 1. No acute intracranial abnormalities. 2. Mild atrophy. Stable appearance from the prior study.   Medical Consultants:    Oncology (Dr. Julien Nordmann)   Anti-Infectives:  Rocephin 11/09/13---> 11/10/2013   Azithromycin 11/09/13---> 11/10/2013     Signed:  Leisa Lenz, MD  [facility] 11/10/2013, 10:22 AM  Pager #: [phone]   Discharge Exam: Filed Vitals:   11/10/13 0617  BP: 98/62  Pulse: 98  Temp: 98 F (36.7 C)  Resp: 16   Filed Vitals:   11/09/13 1359 11/09/13 2232 11/10/13 0004 11/10/13 0617  BP: 112/64 112/78  98/62  Pulse: 95 117  98  Temp: 98.4 F (36.9 C) 97.5 F (36.4 C)  98 F (36.7 C)  TempSrc: Oral Oral  Oral  Resp: 16 16  16   Height:      Weight:    94.847 kg (209 lb 1.6 oz)  SpO2: 99% 98% 98% 97%    General: Pt is alert, follows commands appropriately, not in acute distress Cardiovascular: Regular rate and rhythm, S1/S2 +, no murmurs Respiratory: Clear to auscultation bilaterally, no wheezing, no crackles, no rhonchi Abdominal: Soft, non tender, non distended, bowel sounds +, no guarding Extremities: no edema, no cyanosis, pulses palpable bilaterally DP and PT Neuro: Grossly nonfocal  Discharge Instructions  Discharge Instructions    Diet - low sodium heart healthy    Complete by:  As directed      Discharge instructions    Complete by:  As directed   The office of your PCP will call you and schedule an appt with you for after this hospitalization follow up. Please have them repeat blood work during that appt. Appt with Dr.Mohamed is scheduled for 11/17/2013 at 8:30 am     Increase activity slowly    Complete by:  As directed             Medication List    TAKE these medications        albuterol 108 (90 BASE) MCG/ACT inhaler  Commonly known as:  PROVENTIL HFA;VENTOLIN HFA  Inhale 2 puffs into the lungs 3 (three) times daily as needed for wheezing or shortness of breath.     albuterol (2.5 MG/3ML) 0.083% nebulizer solution  Commonly known as:  PROVENTIL  Take 3 mLs (2.5 mg total) by nebulization every 4 (four) hours as needed for wheezing or shortness of breath.     amitriptyline 100 MG tablet  Commonly known as:  ELAVIL  Take 1 tablet (100 mg total) by mouth  at bedtime.     cefpodoxime 200 MG tablet  Commonly known as:  VANTIN  Take 1 tablet (200 mg total) by mouth 2 (two) times daily.     clonazePAM 1 MG tablet  Commonly known as:  KLONOPIN  Take 1 tablet (1 mg total) by mouth 4 (four) times daily.     dexlansoprazole 60 MG capsule  Commonly known as:  DEXILANT  Take 1 capsule (60 mg total) by mouth daily.     diphenhydrAMINE-zinc acetate cream  Commonly known as:  BENADRYL  Apply topically 3 (three) times daily as needed for itching. Apply to affected area     divalproex 500 MG 24 hr tablet  Commonly known as:  DEPAKOTE ER  Take 1 tablet (500 mg total) by mouth daily.     donepezil 5 MG tablet  Commonly known as:  ARICEPT  Take 1 tablet (5 mg total) by mouth at bedtime.     DULoxetine 60 MG capsule  Commonly known as:  CYMBALTA  Take 1 capsule (60 mg total) by mouth every morning.     EPINEPHrine 0.3 mg/0.3 mL Soaj injection  Commonly known  as:  EPIPEN  Inject 0.3 mLs (0.3 mg total) into the muscle once. PRN FOR SEVERE ALLERGIC REACTION     feeding supplement (ENSURE COMPLETE) Liqd  Take 237 mLs by mouth daily.     fentaNYL 50 MCG/HR  Commonly known as:  DURAGESIC - dosed mcg/hr  Place 1 patch (50 mcg total) onto the skin every 3 (three) days.     guaiFENesin 600 MG 12 hr tablet  Commonly known as:  MUCINEX  Take 1 tablet (600 mg total) by mouth 2 (two) times daily as needed for cough or to loosen phlegm.     levothyroxine 25 MCG tablet  Commonly known as:  SYNTHROID, LEVOTHROID  Take 25 mcg by mouth at bedtime.     lidocaine 5 %  Commonly known as:  LIDODERM  Place 1 patch onto the skin daily. Remove & Discard patch within 12 hours or as directed by MD     lidocaine-prilocaine cream  Commonly known as:  EMLA  Apply 1 application topically as needed. Apply to port 1 hr before chemo     loratadine 10 MG tablet  Commonly known as:  CLARITIN  Take 1 tablet (10 mg total) by mouth daily.     ondansetron 4 MG  tablet  Commonly known as:  ZOFRAN  Take 1 tablet (4 mg total) by mouth every 6 (six) hours as needed for refractory nausea / vomiting.     oxyCODONE-acetaminophen 5-325 MG per tablet  Commonly known as:  PERCOCET/ROXICET  Take 1 tablet by mouth every 6 (six) hours as needed for severe pain.     pantoprazole 40 MG tablet  Commonly known as:  PROTONIX  TAKE (1) TABLET BY MOUTH DAILY.     pravastatin 40 MG tablet  Commonly known as:  PRAVACHOL  Take 40 mg by mouth daily.     predniSONE 2.5 MG tablet  Commonly known as:  DELTASONE  Take 3 tablets (7.5 mg total) by mouth daily with breakfast.     prochlorperazine 10 MG tablet  Commonly known as:  COMPAZINE  Take 1 tablet (10 mg total) by mouth every 6 (six) hours as needed for nausea or vomiting.     SPIRIVA RESPIMAT 2.5 MCG/ACT Aers  Generic drug:  Tiotropium Bromide Monohydrate  Inhale 2 puffs into the lungs daily.             Follow-up Information    Follow up with Vic Blackbird, MD.   Specialty:  Family Medicine   Why:  Follow up appt after recent hospitalization   Contact information:   [place] [address] [phone]       Follow up with Eilleen Kempf., MD On 11/17/2013.   Specialty:  Oncology   Why:  8:30 am   Contact information:   [address] [place] [address] [phone]        The results of significant diagnostics from this hospitalization (including imaging, microbiology, ancillary and laboratory) are listed below for reference.    Significant Diagnostic Studies: Dg Lumbar Spine Complete  11/06/2013   CLINICAL DATA:  Pt has a hx of lung cancer. She fell yesterday onto her left side. She is having some low back pain and hip joint pain since her fall. Hx of 5 back surgeries  EXAM: LUMBAR SPINE - COMPLETE 4+ VIEW  COMPARISON:  05/23/2013  FINDINGS: No fracture.  No spondylolisthesis.  Bilateral pedicle screws and interconnecting rods diffuse L3-L4. There are  intervertebral  metallic cages at L4 -L5 and L5-S1. Radiolucent disc spacer and bone graft material fuses the L3-L4 disc.  There is moderate loss of disc height at L2-L3 with endplate sclerosis and osteophytes.  Soft tissues show aortic vascular calcifications but are otherwise unremarkable.  No change from the prior radiographs.  IMPRESSION: 1. Postsurgical and degenerative changes, stable from the prior lumbar spine radiographs. 2. No fracture or acute finding.   Electronically Signed   By: Lajean Manes M.D.   On: 11/06/2013 20:38   Dg Hip Bilateral W/pelvis  11/06/2013   CLINICAL DATA:  Pt has a hx of lung cancer. She fell yesterday onto her left side. She is having some low back pain and hip joint pain since her fall. Hx of 5 back surgeries  EXAM: BILATERAL HIP WITH PELVIS - 4+ VIEW  COMPARISON:  CT, 12/31/2011  FINDINGS: No fracture. Hip joints are normally spaced and aligned with no arthropathic change. There is mild irregular sclerosis along the left lateral ilium consistent with a previous bone harvesting site. Bony pelvis otherwise unremarkable. There are changes from lumbar spine fusion, stable from the prior CT.  IMPRESSION: No fracture.  No hip joint abnormality.   Electronically Signed   By: Lajean Manes M.D.   On: 11/06/2013 20:36   Ct Head Wo Contrast  11/06/2013   CLINICAL DATA:  was referred from her office for direct admission for evaluation of progressive weakness and falls. Patient reported feeling weak for past 1 week prior to this admission and had an episode of fall yesterday. She reports not hitting her head but felt pain in her hips and back. She reported that "legs gave out on her". She also reported lightheadedness but no loss of consciousness prior to the fall.  EXAM: CT HEAD WITHOUT CONTRAST  TECHNIQUE: Contiguous axial images were obtained from the base of the skull through the vertex without intravenous contrast.  COMPARISON:  05/23/2013  FINDINGS: Ventricles are normal in  configuration. There is ventricular and sulcal enlargement reflecting mild atrophy.  No parenchymal masses or mass effect. There is no evidence of an infarct.  There are no extra-axial masses or abnormal fluid collections.  No intracranial hemorrhage.  Visualized sinuses and mastoid air cells are clear. No skull lesion.  No change from the prior study.  IMPRESSION: 1. No acute intracranial abnormalities. 2. Mild atrophy.  Stable appearance from the prior study.   Electronically Signed   By: Lajean Manes M.D.   On: 11/06/2013 20:41   Dg Chest Port 1 View  11/09/2013   CLINICAL DATA:  Wheezing  EXAM: PORTABLE CHEST - 1 VIEW  COMPARISON:  09/15/2013  FINDINGS: Cardiac shadow is stable. A right chest wall port is again seen. Some linear atelectasis is noted in the right mid lung. The right hilar mass lesion is again noted. The left lung remains clear. No bony abnormality is noted.  IMPRESSION: Mild right mid lung atelectasis.   Electronically Signed   By: Inez Catalina M.D.   On: 11/09/2013 10:41    Microbiology: No results found for this or any previous visit (from the past 240 hour(s)).   Labs: Basic Metabolic Panel:  Recent Labs Lab 11/03/13 1426 11/06/13 2215 11/07/13 0651 11/08/13 0455 11/09/13 0500  NA 135* 139 139 139 139  K 5.2* 4.5 4.4 4.4 4.2  CL  --  103 103 101 99  CO2 29 28 28 27 28   GLUCOSE 93 106* 90 88 100*  BUN 14.3 19 16  7  7  CREATININE 0.8 0.89 0.81 0.71 0.72  CALCIUM 9.0 8.6 8.4 8.9 8.9  MG 2.1 2.1  --   --   --   PHOS  --  3.2  --   --   --    Liver Function Tests:  Recent Labs Lab 11/03/13 1426 11/06/13 2215 11/07/13 0651  AST 10 7 7   ALT 9 7 6   ALKPHOS 101 99 92  BILITOT 0.46 <0.2* 0.4  PROT 6.3* 5.8* 5.5*  ALBUMIN 3.4* 3.1* 2.8*   No results for input(s): LIPASE, AMYLASE in the last 168 hours. No results for input(s): AMMONIA in the last 168 hours. CBC:  Recent Labs Lab 11/03/13 1425 11/06/13 2301 11/07/13 0651 11/08/13 0455 11/09/13 0500  11/10/13 0430  WBC 5.1 0.7* 0.7* 0.9* 2.7* 7.5  NEUTROABS 4.5 0.2*  --  0.3* 1.6* 5.2  HGB 8.3* 6.8* 7.8* 8.3* 7.6* 8.0*  HCT 25.5* 20.3* 23.0* 24.6* 22.8* 23.8*  MCV 96.8 94.9 94.3 94.3 96.2 97.1  PLT 110* 26* 21* 14* 14* 20*   Cardiac Enzymes: No results for input(s): CKTOTAL, CKMB, CKMBINDEX, TROPONINI in the last 168 hours. BNP: BNP (last 3 results)  Recent Labs  08/20/13 0613  PROBNP 1866.0*   CBG:  Recent Labs Lab 11/07/13 0746 11/08/13 0737 11/09/13 0733 11/10/13 0747  GLUCAP 126* 98 86 93    Time coordinating discharge: Over 30 minutes

## 2013-11-10 NOTE — Plan of Care (Signed)
Problem: Phase I Progression Outcomes Goal: OOB as tolerated unless otherwise ordered Outcome: Completed/Met Date Met:  11/10/13 Goal: Initial discharge plan identified Outcome: Completed/Met Date Met:  11/10/13 Goal: Hemodynamically stable Outcome: Progressing

## 2013-11-10 NOTE — Progress Notes (Signed)
DIAGNOSIS: Small cell carcinoma of lung  Primary site: Lung (Right)  Staging method: AJCC 7th Edition  Clinical: Stage IIA (T1a, N1, M0) signed by Curt Bears, MD on 08/06/2013 2:02 PM  Summary: Stage IIA (T1a, N1, M0)  PRIOR THERAPY: none  CURRENT THERAPY: Systemic chemotherapy with cisplatin 60 mg/m2 on day1, etoposide 120 mg/m2 on days 1, 2, and 3 with neulasta support on day 4. Status post 4 cycles.   Subjective: The patient is seen and examined today. She is feeling very well with no specific complaints except for mild fatigue. She was admitted to [facility] with pancytopenia as well as significant fatigue and weakness with falls. She has rough time tolerating her systemic chemotherapy but she was able to complete 4 cycles of this treatment. This was also concurrent with radiation which was discontinued secondary 20 pounds. She denied having any fever or chills today. She denied having any nausea or vomiting. No significant chest pain, shortness breath, cough or hemoptysis.  Objective: Vital signs in last 24 hours: Temp:  [97.5 F (36.4 C)-98.4 F (36.9 C)] 98 F (36.7 C) (11/10 0617) Pulse Rate:  [95-117] 98 (11/10 0617) Resp:  [16] 16 (11/10 0617) BP: (98-112)/(58-78) 98/62 mmHg (11/10 0617) SpO2:  [97 %-99 %] 97 % (11/10 0617) Weight:  [209 lb 1.6 oz (94.847 kg)] 209 lb 1.6 oz (94.847 kg) (11/10 0617)  Intake/Output from previous day: 11/09 0701 - 11/10 0700 In: 360 [P.O.:360] Out: -  Intake/Output this shift:    General appearance: alert, cooperative, fatigued and no distress Resp: clear to auscultation bilaterally Cardio: regular rate and rhythm, S1, S2 normal, no murmur, click, rub or gallop GI: soft, non-tender; bowel sounds normal; no masses,  no organomegaly Extremities: extremities normal, atraumatic, no cyanosis or edema  Lab Results:   Recent Labs  11/09/13 0500 11/10/13 0430  WBC 2.7* 7.5  HGB 7.6* 8.0*  HCT 22.8* 23.8*  PLT 14* 20*    BMET  Recent Labs  11/08/13 0455 11/09/13 0500  NA 139 139  K 4.4 4.2  CL 101 99  CO2 27 28  GLUCOSE 88 100*  BUN 7 7  CREATININE 0.71 0.72  CALCIUM 8.9 8.9    Studies/Results: Dg Chest Port 1 View  11/09/2013   CLINICAL DATA:  Wheezing  EXAM: PORTABLE CHEST - 1 VIEW  COMPARISON:  09/15/2013  FINDINGS: Cardiac shadow is stable. A right chest wall port is again seen. Some linear atelectasis is noted in the right mid lung. The right hilar mass lesion is again noted. The left lung remains clear. No bony abnormality is noted.  IMPRESSION: Mild right mid lung atelectasis.   Electronically Signed   By: Inez Catalina M.D.   On: 11/09/2013 10:41    Medications: I have reviewed the patient's current medications.  Assessment/Plan: This is a very pleasant 58 years old white female with limited stage small cell lung cancer who completed 4 cycles of systemic chemotherapy with cisplatin and etoposide concurrent with radiation. She had significant pancytopenia after every cycle of her treatment. Her pancytopenia significantly improved except for the thrombocytopenia which usually takes a longer time for recovery. The patient is feeling much better and ready for discharge from the hospital. I will arrange a follow-up appointment for her at the [place] with repeat CT scan of the chest for restaging of her disease. Thank you for taking good care of Tonya Sullivan. I will continue to follow up the patient with you and assist in her  management on as-needed basis.   LOS: 4 days    Chandan Fly K. 11/10/2013

## 2013-11-10 NOTE — Discharge Instructions (Signed)
Neutropenia Neutropenia is a condition that occurs when the level of a certain type of white blood cell (neutrophil) in your body becomes lower than normal. Neutrophils are made in the bone marrow and fight infections. These cells protect against bacteria and viruses. The fewer neutrophils you have, and the longer your body remains without them, the greater your risk of getting a severe infection becomes. CAUSES  The cause of neutropenia may be hard to determine. However, it is usually due to 3 main problems:   Decreased production of neutrophils. This may be due to:  Certain medicines such as chemotherapy.  Genetic problems.  Cancer.  Radiation treatments.  Vitamin deficiency.  Some pesticides.  Increased destruction of neutrophils. This may be due to:  Overwhelming infections.  Hemolytic anemia. This is when the body destroys its own blood cells.  Chemotherapy.  Neutrophils moving to areas of the body where they cannot fight infections. This may be due to:  Dialysis procedures.  Conditions where the spleen becomes enlarged. Neutrophils are held in the spleen and are not available to the rest of the body.  Overwhelming infections. The neutrophils are held in the area of the infection and are not available to the rest of the body. SYMPTOMS  There are no specific symptoms of neutropenia. The lack of neutrophils can result in an infection, and an infection can cause various problems. DIAGNOSIS  Diagnosis is made by a blood test. A complete blood count is performed. The normal level of neutrophils in human blood differs with age and race. Infants have lower counts than older children and adults. African Americans have lower counts than Caucasians or Asians. The average adult level is 1500 cells/mm3 of blood. Neutrophil counts are interpreted as follows:  Greater than 1000 cells/mm3 gives normal protection against infection.  500 to 1000 cells/mm3 gives an increased risk for  infection.  200 to 500 cells/mm3 is a greater risk for severe infection.  Lower than 200 cells/mm3 is a marked risk of infection. This may require hospitalization and treatment with antibiotic medicines. TREATMENT  Treatment depends on the underlying cause, severity, and presence of infections or symptoms. It also depends on your health. Your caregiver will discuss the treatment plan with you. Mild cases are often easily treated and have a good outcome. Preventative measures may also be started to limit your risk of infections. Treatment can include:  Taking antibiotics.  Stopping medicines that are known to cause neutropenia.  Correcting nutritional deficiencies by eating green vegetables to supply folic acid and taking vitamin B supplements.  Stopping exposure to pesticides if your neutropenia is related to pesticide exposure.  Taking a blood growth factor called sargramostim, pegfilgrastim, or filgrastim if you are undergoing chemotherapy for cancer. This stimulates white blood cell production.  Removal of the spleen if you have Felty's syndrome and have repeated infections. HOME CARE INSTRUCTIONS   Follow your caregiver's instructions about when you need to have blood work done.  Wash your hands often. Make sure others who come in contact with you also wash their hands.  Wash raw fruits and vegetables before eating them. They can carry bacteria and fungi.  Avoid people with colds or spreadable (contagious) diseases (chickenpox, herpes zoster, influenza).  Avoid large crowds.  Avoid construction areas. The dust can release fungus into the air.  Be cautious around children in daycare or school environments.  Take care of your respiratory system by coughing and deep breathing.  Bathe daily.  Protect your skin from cuts and  burns.  Do not work in the garden or with flowers and plants.  Care for the mouth before and after meals by brushing with a soft toothbrush. If you have  mucositis, do not use mouthwash. Mouthwash contains alcohol and can dry out the mouth even more.  Clean the area between the genitals and the anus (perineal area) after urination and bowel movements. Women need to wipe from front to back.  Use a water soluble lubricant during sexual intercourse and practice good hygiene after. Do not have intercourse if you are severely neutropenic. Check with your caregiver for guidelines.  Exercise daily as tolerated.  Avoid people who were vaccinated with a live vaccine in the past 30 days. You should not receive live vaccines (polio, typhoid).  Do not provide direct care for pets. Avoid animal droppings. Do not clean litter boxes and bird cages.  Do not share food utensils.  Do not use tampons, enemas, or rectal suppositories unless directed by your caregiver.  Use an electric razor to remove hair.  Wash your hands after handling magazines, letters, and newspapers. SEEK IMMEDIATE MEDICAL CARE IF:   You have a fever.  You have chills or start to shake.  You feel nauseous or vomit.  You develop mouth sores.  You develop aches and pains.  You have redness and swelling around open wounds.  Your skin is warm to the touch.  You have pus coming from your wounds.  You develop swollen lymph nodes.  You feel weak or fatigued.  You develop red streaks on the skin. MAKE SURE YOU:  Understand these instructions.  Will watch your condition.  Will get help right away if you are not doing well or get worse. Document Released: 06/09/2001 Document Revised: 03/12/2011 Document Reviewed: 07/07/2010 [facility] Patient Information 2015 [place], [place]. This information is not intended to replace advice given to you by your health care provider. Make sure you discuss any questions you have with your health care provider. Thrombocytopenia Thrombocytopenia is a condition in which there is an abnormally small number of platelets in your blood. Platelets  are also called thrombocytes. Platelets are needed for blood clotting. CAUSES Thrombocytopenia is caused by:   Decreased production of platelets. This can be caused by:  Aplastic anemia in which your bone marrow quits making blood cells.  Cancer in the bone marrow.  Use of certain medicines, including chemotherapy.  Infection in the bone marrow.  Heavy alcohol consumption.  Increased destruction of platelets. This can be caused by:  Certain immune diseases.  Use of certain drugs.  Certain blood clotting disorders.  Certain inherited disorders.  Certain bleeding disorders.  Pregnancy.  Having an enlarged spleen (hypersplenism). In hypersplenism, the spleen gathers up platelets from circulation. This means the platelets are not available to help with blood clotting. The spleen can enlarge due to cirrhosis or other conditions. SYMPTOMS  The symptoms of thrombocytopenia are side effects of poor blood clotting. Some of these are:  Abnormal bleeding.  Nosebleeds.  Heavy menstrual periods.  Blood in the urine or stools.  Purpura. This is a purplish discoloration in the skin produced by small bleeding vessels near the surface of the skin.  Bruising.  A rash that may be petechial. This looks like pinpoint, purplish-red spots on the skin and mucous membranes. It is caused by bleeding from small blood vessels (capillaries). DIAGNOSIS  Your caregiver will make this diagnosis based on your exam and blood tests. Sometimes, a bone marrow study is done to look  for the original cells (megakaryocytes) that make platelets. TREATMENT  Treatment depends on the cause of the condition.  Medicines may be given to help protect your platelets from being destroyed.  In some cases, a replacement (transfusion) of platelets may be required to stop or prevent bleeding.  Sometimes, the spleen must be surgically removed. HOME CARE INSTRUCTIONS   Check the skin and linings inside your mouth  for bruising or bleeding as directed by your caregiver.  Check your sputum, urine, and stool for blood as directed by your caregiver.  Do not return to any activities that could cause bumps or bruises until your caregiver says it is okay.  Take extra care not to cut yourself when shaving or when using scissors, needles, knives, and other tools.  Take extra care not to burn yourself when ironing or cooking.  Ask your caregiver if it is okay for you to drink alcohol.  Only take over-the-counter or prescription medicines as directed by your caregiver.  Notify all your caregivers, including dentists and eye doctors, about your condition. SEEK IMMEDIATE MEDICAL CARE IF:   You develop active bleeding from anywhere in your body.  You develop unexplained bruising or bleeding.  You have blood in your sputum, urine, or stool. MAKE SURE YOU:  Understand these instructions.  Will watch your condition.  Will get help right away if you are not doing well or get worse. Document Released: 12/18/2004 Document Revised: 03/12/2011 Document Reviewed: 10/20/2010 [facility] Patient Information 2015 [place], [place]. This information is not intended to replace advice given to you by your health care provider. Make sure you discuss any questions you have with your health care provider.

## 2013-11-10 NOTE — Progress Notes (Signed)
Patient ID: Tonya Sullivan, female   DOB: 07-11-1955, 58 y.o.   MRN: [id number]  [facility] PROGRESS NOTE  LAPORCHE MARTELLE RWE:[id number] DOB: 10-06-1955 DOA: 11/06/2013 PCP: Vic Blackbird, MD  Brief Narrative:  58 y.o. female with a PMH of small cell lung cancer status post 4 cycles of cisplatin and etoposide Chemotherapy was completed 10/29/13 as well as concurrent radiation therapy (40/60 planned treatments) with treatment course complicated by noncompliance and low blood counts, admitted 11/06/13 with weakness and falls. She is currently pancytopenic and deconditioned which is likely contributing to her weakness.  Assessment/Plan:   Principal Problem:  General weakness / falls / headache in the setting of small cell lung cancer  Radiographic workup completed with findings as noted below. No fractures to the lumbar spine or hips. CT of the head unremarkable. Weakness likely from underlying cancer/deconditioning/pancytopenia.  Urine drug screen and alcohol levels showed positivity for opiates and benzodiazepines.  Physical therapy evaluation done 11/07/13 with home health PT recommended.  Active Problems:  Allergic reaction  ? Transfusion induced pruritis. No obvious rash.  Continue Claritin and Pepcid.  Improving    Chemotherapy induced pancytopenia  Status post 1 unit of packed red blood cells. Hemoglobin stable post transfusion.  Treated with Granix until [place] greater than 1000. ANC trending up 1600 --> 5600 this AM   Platelet count markedly depressed but also improving over the past 24 hours    Depression with anxiety  Continue Elavil and Klonopin. Continue Cymbalta.  Add Klonopin 0.5 mg BID PRN.   Chronic back pain  Continue fentanyl patch and when necessary Percocet. Morphine ordered as needed for severe pain.   GERD (gastroesophageal reflux disease)  Continue PPI therapy.   Obesity, morbid  Evaluated by dietitian on 11/08/13.    History of adrenal insufficiency  Continue low-dose prednisone therapy.  BP stable.   History of autoimmune thyroiditis / hypothyroidism  Continue Synthroid.   Dyslipidemia  Continue Pravachol.   COPD exacerbation  Continue bronchodilators.  Still wheezing on exam but refusing increasing dose of steroids  CXR 11/09 with mid right lung atelectasis, no signs of PNA  Continue empiric Zithro and rocephin day #2   DVT Prophylaxis  Continue SCDs.  Code Status: Full. Family Communication: No family at the bedside.  Disposition Plan: Home in 24 - 48 hours.    IV Access:    Port-A-Cath  Procedures and diagnostic studies:   Dg Lumbar Spine Complete 11/06/2013: 1. Postsurgical and degenerative changes, stable from the prior lumbar spine radiographs. 2. No fracture or acute finding.   Dg Hip Bilateral W/pelvis 11/06/2013: No fracture. No hip joint abnormality.   Ct Head Wo Contrast 11/06/2013: 1. No acute intracranial abnormalities. 2. Mild atrophy. Stable appearance from the prior study.   Medical Consultants:    None.  Anti-Infectives:    Rocephin 11/09/13--->  Azithromycin 11/09/13--->   Leisa Lenz, MD  [facility] Pager [phone]  If 7PM-7AM, please contact night-coverage [email or web address] Password [email or web address] 11/10/2013, 8:56 AM   LOS: 4 days    HPI/Subjective: No acute overnight events.  Objective: Filed Vitals:   11/09/13 1359 11/09/13 2232 11/10/13 0004 11/10/13 0617  BP: 112/64 112/78  98/62  Pulse: 95 117  98  Temp: 98.4 F (36.9 C) 97.5 F (36.4 C)  98 F (36.7 C)  TempSrc: Oral Oral  Oral  Resp: 16 16  16   Height:      Weight:    94.847 kg (209 lb 1.6 oz)  SpO2: 99% 98%  98% 97%    Intake/Output Summary (Last 24 hours) at 11/10/13 0856 Last data filed at 11/09/13 1900  Gross per 24 hour  Intake    360 ml  Output      0 ml  Net    360 ml    Exam:   General:  Pt is alert, follows commands  appropriately, not in acute distress  Cardiovascular: Regular rate and rhythm, S1/S2  Respiratory: Clear to auscultation bilaterally, mild expiratory wheezing   Abdomen: Soft, non tender, non distended, bowel sounds present  Extremities: No edema, pulses DP and PT palpable bilaterally  Data Reviewed: Basic Metabolic Panel:  Recent Labs Lab 11/03/13 1426 11/06/13 2215 11/07/13 0651 11/08/13 0455 11/09/13 0500  NA 135* 139 139 139 139  K 5.2* 4.5 4.4 4.4 4.2  CL  --  103 103 101 99  CO2 29 28 28 27 28   GLUCOSE 93 106* 90 88 100*  BUN 14.3 19 16 7 7   CREATININE 0.8 0.89 0.81 0.71 0.72  CALCIUM 9.0 8.6 8.4 8.9 8.9  MG 2.1 2.1  --   --   --   PHOS  --  3.2  --   --   --    Liver Function Tests:  Recent Labs Lab 11/03/13 1426 11/06/13 2215 11/07/13 0651  AST 10 7 7   ALT 9 7 6   ALKPHOS 101 99 92  BILITOT 0.46 <0.2* 0.4  PROT 6.3* 5.8* 5.5*  ALBUMIN 3.4* 3.1* 2.8*   CBC:  Recent Labs Lab 11/03/13 1425 11/06/13 2301 11/07/13 0651 11/08/13 0455 11/09/13 0500 11/10/13 0430  WBC 5.1 0.7* 0.7* 0.9* 2.7* 7.5  NEUTROABS 4.5 0.2*  --  0.3* 1.6* 5.2  HGB 8.3* 6.8* 7.8* 8.3* 7.6* 8.0*  HCT 25.5* 20.3* 23.0* 24.6* 22.8* 23.8*  MCV 96.8 94.9 94.3 94.3 96.2 97.1  PLT 110* 26* 21* 14* 14* 20*   CBG:  Recent Labs Lab 11/07/13 0746 11/08/13 0737 11/09/13 0733 11/10/13 0747  GLUCAP 126* 98 86 93   Scheduled Meds: . amitriptyline  100 mg Oral QHS  . azithromycin  500 mg Intravenous Q24H  . cefTRIAXone  IV  1 g Intravenous Q24H  . clonazePAM  1 mg Oral QID  . divalproex  500 mg Oral Daily  . DULoxetine  60 mg Oral q morning - 10a  . famotidine  20 mg Oral BID  . fentaNYL  50 mcg Transdermal Q72H  . levothyroxine  25 mcg Oral QHS  . lidocaine  1 patch Transdermal Q24H  . loratadine  10 mg Oral Daily  . pantoprazole  40 mg Oral QHS  . pravastatin  40 mg Oral Daily  . predniSONE  7.5 mg Oral Q breakfast  . tiotropium  18 mcg Inhalation Daily   Continuous  Infusions:

## 2013-11-12 ENCOUNTER — Telehealth: Payer: Self-pay | Admitting: *Deleted

## 2013-11-12 ENCOUNTER — Telehealth: Payer: Self-pay | Admitting: Family Medicine

## 2013-11-12 NOTE — Telephone Encounter (Signed)
Patient is calling back to say that her feet and ankles are swollen wants to know if we can call something in for this  [place]

## 2013-11-12 NOTE — Telephone Encounter (Signed)
Patient called asking if Dr.Wentworth could send a letter of diagnosis of her cancer to hospice, she had hospice call Dr.Mohamed's office but didn't  Get an answer back, you can call Robb Matar at [phone] to ask for their fax number, I will relay this message to Dr.Wentworth's nurse Rebeca Allegra and MD, someone will cll you back with status ,patient phone number=[phone], asked if [facility] called Dr.mohamed nurse, "[facility] called them" 10:18 AM

## 2013-11-12 NOTE — Telephone Encounter (Signed)
MD please advise

## 2013-11-12 NOTE — Telephone Encounter (Signed)
Pt has made a self referral to hospice.  Dr Vista Mink is aware.  Records sent to hospice of [facility] [phone] fax #

## 2013-11-12 NOTE — Telephone Encounter (Signed)
Patient is calling to say that she has a swollen eye and just got out of the hospital wants to know if maybe the medication they put her on is causing this  [phone]

## 2013-11-12 NOTE — Telephone Encounter (Signed)
Call placed to patient.   Denies that she has any swelling of her face or eyes.   States that she was seen in hospital for hypotension, and she does have some edema to BLE. Reports that she does not think she is having an allergic reaction at this time.   Also reports that she has initiated a referral to hospice, and they are currently assessing her at this time.   MD to be made aware.

## 2013-11-13 ENCOUNTER — Ambulatory Visit: Payer: Medicare Other | Admitting: Family Medicine

## 2013-11-13 ENCOUNTER — Ambulatory Visit (INDEPENDENT_AMBULATORY_CARE_PROVIDER_SITE_OTHER): Payer: Medicare Other | Admitting: Psychiatry

## 2013-11-13 DIAGNOSIS — F332 Major depressive disorder, recurrent severe without psychotic features: Secondary | ICD-10-CM

## 2013-11-13 NOTE — Progress Notes (Signed)
    THERAPIST PROGRESS NOTE  Session Time: Friday 11/13/2013 2:10 PM - 2:40 PM  Participation Level: Active  Behavioral Response: CasualAlertAnxious  Type of Therapy: Individual Therapy  Treatment Goals addressed: Improve ability to manage stress and anxiety using coping skills and relaxation techniques   Interventions: CBT and Supportive  Summary: Tonya Sullivan is a 58 y.o. female who presents with a long-standing history of depression beginning in adolescence. Patient also has significant trauma history. Patient also experienced grief and loss issues due to to the death of her husband in 09/17/10. She continues to experience stress related to ongoing health issues as well as family problems.She was hospitalized at the [facility] in May 2015 due to reaction to medication and depression  Patient was last seen in 09/16/2013. Since that time, she has had chemotherapy and radiation treatment but both were discontinued due to detrimental effects on patient per her report. She is scheduled for more medical tests to see how far cancer has spread. Patient referred self to Hospice and had her initial assessment yesterday. Patient expresses acceptance of her diagnosis and says she is trusting God. She reports her mood has been pretty good. She has increased support from her son and his family. She recently was discharged from the hospital and has stayed with son for the last 2 nights. Patient is glad for the support and being around her grandchildren.    Suicidal/Homicidal: No  Therapist Response: Therapist works with patient to process feelings regarding illness and identify ways to use support system,  Plan: Return again in 3 weeks.  Diagnosis: Axis I: Major Depression, Recurrent severe    Axis II: Deferred    [name], LCSW 11/13/2013

## 2013-11-13 NOTE — Telephone Encounter (Signed)
Noted will see on monday

## 2013-11-13 NOTE — Patient Instructions (Signed)
Discussed orally 

## 2013-11-13 NOTE — Telephone Encounter (Signed)
Pt coming in today at 12noon

## 2013-11-13 NOTE — Telephone Encounter (Signed)
Pt missed appt today and had tom reschedule for Monday.  I told her, per your recommendation, that provider unable to order her a diuretic at this point due to low BP.  Told her you want her to rest and keep legs elevated over the weekend and we will see her on  Monday.  She does not have compression hose and doubts she could get them on

## 2013-11-13 NOTE — Telephone Encounter (Signed)
Please schedule appt for pt ASAP today or Monday, no diuretics because of her severe hypotension, i am sure she is swollen because of the fluids they gave her during her admission, elevate legs to help with swelling for now , see if she has any compression socks or hose, she can wear these too

## 2013-11-16 ENCOUNTER — Encounter: Payer: Self-pay | Admitting: Family Medicine

## 2013-11-16 ENCOUNTER — Ambulatory Visit (INDEPENDENT_AMBULATORY_CARE_PROVIDER_SITE_OTHER): Payer: Medicare Other | Admitting: Family Medicine

## 2013-11-16 ENCOUNTER — Telehealth: Payer: Self-pay | Admitting: Medical Oncology

## 2013-11-16 VITALS — BP 142/90 | HR 94 | Temp 98.4°F | Resp 20 | Ht 67.0 in | Wt 205.0 lb

## 2013-11-16 DIAGNOSIS — C3490 Malignant neoplasm of unspecified part of unspecified bronchus or lung: Secondary | ICD-10-CM

## 2013-11-16 DIAGNOSIS — I6529 Occlusion and stenosis of unspecified carotid artery: Secondary | ICD-10-CM

## 2013-11-16 DIAGNOSIS — R000 Tachycardia, unspecified: Secondary | ICD-10-CM | POA: Insufficient documentation

## 2013-11-16 DIAGNOSIS — D61810 Antineoplastic chemotherapy induced pancytopenia: Secondary | ICD-10-CM

## 2013-11-16 DIAGNOSIS — G8929 Other chronic pain: Secondary | ICD-10-CM

## 2013-11-16 DIAGNOSIS — T451X5A Adverse effect of antineoplastic and immunosuppressive drugs, initial encounter: Secondary | ICD-10-CM

## 2013-11-16 DIAGNOSIS — I959 Hypotension, unspecified: Secondary | ICD-10-CM | POA: Insufficient documentation

## 2013-11-16 DIAGNOSIS — M549 Dorsalgia, unspecified: Secondary | ICD-10-CM

## 2013-11-16 DIAGNOSIS — I951 Orthostatic hypotension: Secondary | ICD-10-CM

## 2013-11-16 DIAGNOSIS — M7989 Other specified soft tissue disorders: Secondary | ICD-10-CM

## 2013-11-16 MED ORDER — OXYCODONE-ACETAMINOPHEN 5-325 MG PO TABS: 1.0000 | ORAL_TABLET | Freq: Three times a day (TID) | ORAL | Status: DC | PRN

## 2013-11-16 MED ORDER — METOPROLOL TARTRATE 25 MG PO TABS: ORAL_TABLET | ORAL | Status: DC

## 2013-11-16 MED ORDER — FENTANYL 75 MCG/HR TD PT72: 75.0000 ug | MEDICATED_PATCH | TRANSDERMAL | Status: DC

## 2013-11-16 NOTE — Progress Notes (Signed)
Patient ID: Tonya Sullivan, female   DOB: [birth year], 58 y.o.   MRN: [id number]   Subjective:    Patient ID: Tonya Sullivan, female    DOB: 03/22/1955, 58 y.o.   MRN: [id number]  Patient presents for Hospitalization Follow-up and Needs something else for pain - Fentyl no help patient here for hospital follow-up. She was admitted once again secondary to hypotension and dehydration fatigue and Falls. All her symptoms are due to her inability to tolerate chemotherapy and her poor intake. She is currently pancytopenic and they're covering her with cefpodoxime for probable PNA.  It is unclear if she has been completely off her metoprolol she states that her [place] tells her to restart her medication time after myself or the oncologist would tell her to stop it. Her only complaint today is severe pain in her back in her legs all over. She denies any chest pain or shortness of breath at this time. She states sheis that she does not tolerate treatment and that she is dying but she has not been severe pain. She's currently on Duragesic patch 50 g she was given Percocet by her oncologist as well as the emergency room. She is said to have a CT scan of her chest for staging. Of note she did contact hospice on her own to self refer but she still is under active treatment at this time   Review Of Systems:  GEN- +fatigue, fever, weight loss,weakness, recent illness HEENT- denies eye drainage, change in vision, nasal discharge, CVS- denies chest pain, palpitations RESP- denies SOB, cough, wheeze ABD- denies N/V, change in stools, abd pain GU- denies dysuria, hematuria, dribbling, incontinence MSK-+ joint pain,+ muscle aches, injury Neuro- denies headache, dizziness, syncope, seizure activity       Objective:    BP 142/90 mmHg  Pulse 94  Temp(Src) 98.4 F (36.9 C) (Oral)  Resp 20  Ht 5\' 7"  (1.702 m)  Wt 205 lb (92.987 kg)  BMI 32.10 kg/m2 GEN- NAD, alert and oriented x3, hair loss scalp, brows,  fatigued appearing HEENT- PERRL, EOMI, non injected sclera, pink conjunctiva, MMM, oropharynx clear Neck- Supple, no LAD CVS- RRR, no murmur RESP-decreased at bases EXT- 1+ pitting edema below shins ( improved) Pulses- Radial 2+        Assessment & Plan:      Problem List Items Addressed This Visit    Tachycardia   Small cell lung cancer    Patient has stopped treatment due to side effects. She will have a repeat CT chest that is coming up that sensation may further details can be what her prognosis may be, based on current situation appears poor prognosis and hospice may be warrented    Relevant Medications      fentaNYL (DURAGESIC) patch 75 mcg/hr      oxyCODONE-acetaminophen (PERCOCET/ROXICET) 5-325 MG per tablet   Other Relevant Orders      CBC with Differential      Comprehensive metabolic panel   Hypotension    She's had recurrent episodes of hypotension due to poor intake, dehydration as well as medication side effect. We've taken her off of metoprolol the past couple weeks seems that the nurse practitioner working with the [facility] program has had her restart at various times.doses unclear how long she is off the medication. She does have superimposed tachycardia which we think is due to her renal insufficiency which is Why sometimes I do think that she may need something for rate control but not  something to her blood pressure down any further. I will try her on 6.25 mg twice a day metoprolol just enough to keep her heart rate at least in the 90s as she tends to go up in the low 100s which makes her feel worse.    Relevant Medications      metoprolol tartrate (LOPRESSOR) tablet   Chronic back pain    Worsening pain due to chemotherapy , will increase Fenatanyl to 35mcg once a day Will add percocet TID prn breakthroughout pain  #60 tabs given    Relevant Medications      fentaNYL (DURAGESIC) patch 75 mcg/hr      oxyCODONE-acetaminophen (PERCOCET/ROXICET) 5-325 MG per tablet    Antineoplastic chemotherapy induced pancytopenia - Primary   Relevant Orders      CBC with Differential      Comprehensive metabolic panel    Other Visit Diagnoses    Small cell carcinoma of lung, unspecified laterality        Relevant Medications       fentaNYL (DURAGESIC) patch 75 mcg/hr       oxyCODONE-acetaminophen (PERCOCET/ROXICET) 5-325 MG per tablet    Leg swelling        2/2 fluids during admission, improved weight loss of 5lbs, compression hose, no diuretic witth recurrent hypotension/dehydration       Note: This dictation was prepared with Dragon dictation along with smaller phrase technology. Any transcriptional errors that result from this process are unintentional.

## 2013-11-16 NOTE — Telephone Encounter (Signed)
Pt aksign aobut next CT scan -note to [place].

## 2013-11-16 NOTE — Patient Instructions (Signed)
Fentanyl increased to 69mcg once a day  Take only 1/4 tablet of the metoprolol to keep your heart rate down Percocet for breakthrough pain Compression hose for your legs I will f/u with you after the CT scan of chest and we will discuss hospice more

## 2013-11-16 NOTE — Assessment & Plan Note (Signed)
Patient has stopped treatment due to side effects. She will have a repeat CT chest that is coming up that sensation may further details can be what her prognosis may be, based on current situation appears poor prognosis and hospice may be warrented

## 2013-11-16 NOTE — Assessment & Plan Note (Signed)
She's had recurrent episodes of hypotension due to poor intake, dehydration as well as medication side effect. We've taken her off of metoprolol the past couple weeks seems that the nurse practitioner working with the [facility] program has had her restart at various times.doses unclear how long she is off the medication. She does have superimposed tachycardia which we think is due to her renal insufficiency which is Why sometimes I do think that she may need something for rate control but not something to her blood pressure down any further. I will try her on 6.25 mg twice a day metoprolol just enough to keep her heart rate at least in the 90s as she tends to go up in the low 100s which makes her feel worse.

## 2013-11-16 NOTE — Assessment & Plan Note (Signed)
Worsening pain due to chemotherapy , will increase Fenatanyl to 61mcg once a day Will add percocet TID prn breakthroughout pain  #60 tabs given

## 2013-11-17 ENCOUNTER — Other Ambulatory Visit: Payer: Self-pay | Admitting: Medical Oncology

## 2013-11-17 ENCOUNTER — Ambulatory Visit: Payer: Self-pay

## 2013-11-17 ENCOUNTER — Telehealth: Payer: Self-pay | Admitting: Internal Medicine

## 2013-11-17 ENCOUNTER — Other Ambulatory Visit: Payer: Self-pay

## 2013-11-17 ENCOUNTER — Ambulatory Visit: Payer: Self-pay | Admitting: Internal Medicine

## 2013-11-17 ENCOUNTER — Telehealth: Payer: Self-pay | Admitting: Medical Oncology

## 2013-11-17 DIAGNOSIS — C3490 Malignant neoplasm of unspecified part of unspecified bronchus or lung: Secondary | ICD-10-CM

## 2013-11-17 LAB — CBC WITH DIFFERENTIAL/PLATELET
Basophils Absolute: 0 10*3/uL (ref 0.0–0.1)
Basophils Relative: 0 % (ref 0–1)
Eosinophils Absolute: 0 10*3/uL (ref 0.0–0.7)
Eosinophils Relative: 0 % (ref 0–5)
HCT: 27.5 % — ABNORMAL LOW (ref 36.0–46.0)
Hemoglobin: 9 g/dL — CL (ref 12.0–15.0)
LYMPHS PCT: 6 % — AB (ref 12–46)
Lymphs Abs: 1 10*3/uL (ref 0.7–4.0)
MCH: 32.7 pg (ref 26.0–34.0)
MCHC: 32.7 g/dL (ref 30.0–36.0)
MCV: 100 fL (ref 78.0–100.0)
MONO ABS: 1.5 10*3/uL — AB (ref 0.1–1.0)
MONOS PCT: 9 % (ref 3–12)
MPV: 9.3 fL — ABNORMAL LOW (ref 9.4–12.4)
NEUTROS ABS: 14.2 10*3/uL — AB (ref 1.7–7.7)
Neutrophils Relative %: 85 % — ABNORMAL HIGH (ref 43–77)
Platelets: 187 10*3/uL (ref 150–400)
RBC: 2.75 MIL/uL — AB (ref 3.87–5.11)
RDW: 20.1 % — AB (ref 11.5–15.5)
WBC: 16.7 10*3/uL — ABNORMAL HIGH (ref 4.0–10.5)

## 2013-11-17 LAB — COMPREHENSIVE METABOLIC PANEL
ALBUMIN: 3.8 g/dL (ref 3.5–5.2)
ALT: <8 U/L (ref 0–35)
AST: 11 U/L (ref 0–37)
Alkaline Phosphatase: 120 U/L — ABNORMAL HIGH (ref 39–117)
BUN: 5 mg/dL — ABNORMAL LOW (ref 6–23)
CALCIUM: 8.6 mg/dL (ref 8.4–10.5)
CHLORIDE: 98 meq/L (ref 96–112)
CO2: 30 meq/L (ref 19–32)
Creat: 0.84 mg/dL (ref 0.50–1.10)
GLUCOSE: 89 mg/dL (ref 70–99)
POTASSIUM: 4.5 meq/L (ref 3.5–5.3)
Sodium: 137 mEq/L (ref 135–145)
Total Bilirubin: 0.2 mg/dL (ref 0.2–1.2)
Total Protein: 6.5 g/dL (ref 6.0–8.3)

## 2013-11-17 NOTE — Telephone Encounter (Addendum)
I told pt she has Ct scan scheduled this Friday 11/20  To arrive at 1245 fro 1 pm scan. She confirmed appt.

## 2013-11-17 NOTE — Telephone Encounter (Signed)
-----   Message from Curt Bears, MD sent at 11/16/2013  5:26 PM EST ----- 2 weeks. I thought it is already scheduled. ----- Message -----    From: Ardeen Garland, RN    Sent: 11/16/2013  12:39 PM      To: Curt Bears, MD  When do you want her to have CT scan ?

## 2013-11-17 NOTE — Telephone Encounter (Signed)
lvm for pt regarding to NOV appt.. °

## 2013-11-18 ENCOUNTER — Ambulatory Visit: Payer: Self-pay

## 2013-11-19 ENCOUNTER — Ambulatory Visit: Payer: Self-pay

## 2013-11-20 ENCOUNTER — Ambulatory Visit: Payer: Self-pay

## 2013-11-20 ENCOUNTER — Telehealth (HOSPITAL_COMMUNITY): Payer: Self-pay | Admitting: *Deleted

## 2013-11-20 ENCOUNTER — Encounter (HOSPITAL_COMMUNITY): Payer: Self-pay

## 2013-11-20 ENCOUNTER — Ambulatory Visit (HOSPITAL_COMMUNITY)
Admission: RE | Admit: 2013-11-20 | Discharge: 2013-11-20 | Disposition: A | Discharge status: Home / Self Care | Payer: Medicare Other | Source: Ambulatory Visit | Attending: Internal Medicine | Admitting: Internal Medicine

## 2013-11-20 DIAGNOSIS — J9811 Atelectasis: Secondary | ICD-10-CM | POA: Diagnosis not present

## 2013-11-20 DIAGNOSIS — Z9221 Personal history of antineoplastic chemotherapy: Secondary | ICD-10-CM | POA: Insufficient documentation

## 2013-11-20 DIAGNOSIS — I2510 Atherosclerotic heart disease of native coronary artery without angina pectoris: Secondary | ICD-10-CM | POA: Diagnosis not present

## 2013-11-20 DIAGNOSIS — C3411 Malignant neoplasm of upper lobe, right bronchus or lung: Secondary | ICD-10-CM

## 2013-11-20 DIAGNOSIS — J984 Other disorders of lung: Secondary | ICD-10-CM | POA: Insufficient documentation

## 2013-11-20 DIAGNOSIS — I700 Atherosclerosis of aorta: Secondary | ICD-10-CM | POA: Diagnosis not present

## 2013-11-20 DIAGNOSIS — C3490 Malignant neoplasm of unspecified part of unspecified bronchus or lung: Secondary | ICD-10-CM | POA: Diagnosis not present

## 2013-11-20 DIAGNOSIS — Z923 Personal history of irradiation: Secondary | ICD-10-CM | POA: Insufficient documentation

## 2013-11-20 MED ORDER — IOHEXOL 300 MG/ML  SOLN
80.0000 mL | Freq: Once | INTRAMUSCULAR | Status: AC | PRN
  Administered 2013-11-20: 80 mL via INTRAVENOUS

## 2013-11-20 NOTE — Telephone Encounter (Signed)
Please tell her I can't do the testing but Dr.Rodenbaugh can. We can set it up

## 2013-11-20 NOTE — Telephone Encounter (Signed)
Pt calling wanting to know if Dr. Harrington Challenger could be tested for dementia. Per pt her family members think she should not be driving due to her possibly having dementia. Per pt, one of her other doctors told her that she have early stage of dementia but she can not remember who told her. Per pt, she would like to be tested because she would like to start driving and pt would like to speak with Dr. Harrington Challenger. Pt number is [phone].

## 2013-11-23 ENCOUNTER — Telehealth: Payer: Self-pay | Admitting: Internal Medicine

## 2013-11-23 NOTE — Telephone Encounter (Signed)
s.w. pt and confirmed appts.....pt ok and aware °

## 2013-11-24 ENCOUNTER — Other Ambulatory Visit: Payer: Self-pay

## 2013-11-24 ENCOUNTER — Ambulatory Visit: Payer: Self-pay | Admitting: Physician Assistant

## 2013-11-24 ENCOUNTER — Telehealth: Payer: Self-pay | Admitting: Internal Medicine

## 2013-11-24 NOTE — Telephone Encounter (Signed)
Pt called to r/s labs/ov no transportation wanted to see MD, pt confirmed new labs/ov apts and mailed copy to pt...  KJ

## 2013-11-25 ENCOUNTER — Telehealth (HOSPITAL_COMMUNITY): Payer: Self-pay | Admitting: *Deleted

## 2013-11-25 ENCOUNTER — Ambulatory Visit (INDEPENDENT_AMBULATORY_CARE_PROVIDER_SITE_OTHER): Payer: Medicare Other | Admitting: Family Medicine

## 2013-11-25 ENCOUNTER — Encounter: Payer: Self-pay | Admitting: Family Medicine

## 2013-11-25 VITALS — BP 122/62 | HR 68 | Temp 98.3°F | Resp 16 | Ht 67.0 in | Wt 203.0 lb

## 2013-11-25 DIAGNOSIS — L03213 Periorbital cellulitis: Secondary | ICD-10-CM

## 2013-11-25 DIAGNOSIS — L03211 Cellulitis of face: Secondary | ICD-10-CM

## 2013-11-25 DIAGNOSIS — I6529 Occlusion and stenosis of unspecified carotid artery: Secondary | ICD-10-CM

## 2013-11-25 MED ORDER — CLINDAMYCIN HCL 300 MG PO CAPS: 300.0000 mg | ORAL_CAPSULE | Freq: Three times a day (TID) | ORAL | Status: DC

## 2013-11-25 NOTE — Telephone Encounter (Signed)
Pt pharmacy faxed paper refill request for pt Clonazepam 1mg . Per pt records, this medication was last printed for pt 11-03-13 to take to her pharmacy to get it filled. Called pt pharmacy and spoke with Lovena Le the Pharmacist and informed him that this script was printed and given to pt 11-03-13 and he showed understanding and stated he will call pt.

## 2013-11-25 NOTE — Patient Instructions (Signed)
Take antibiotics as prescribed Use cool compress Go to ER if it gets worse F/U as previous

## 2013-11-25 NOTE — Progress Notes (Signed)
Patient ID: Tonya Sullivan, female   DOB: 28-Jul-1955, 58 y.o.   MRN: [id number]   Subjective:    Patient ID: Tonya Sullivan, female    DOB: 1955-08-30, 58 y.o.   MRN: [id number]  Patient presents for R Eye Swelling  patient here with redness and swelling of right eye for the past 3 days. She does not remember any particular injury has not had any drainage. She states that the swelling messes with her vision. It was noted by her [place] the swelling and redness, no recent fever. He denies any sinusitis or upper respiratory symptoms.  Note we also reviewed her CT scan of chest which showed interval decrease in the size of her lung cancer and no new lesions. She has follow-up with her oncologist next week to decide on the next step. At this point we will discontinue anything regarding hospice/palliative care    Review Of Systems:  GEN- denies fatigue, fever, weight loss,weakness, recent illness HEENT- denies eye drainage, +change in vision, nasal discharge, CVS- denies chest pain, palpitations RESP- denies SOB, cough, wheeze Neuro- denies headache, dizziness, syncope, seizure activity       Objective:    BP 122/62 mmHg  Pulse 68  Temp(Src) 98.3 F (36.8 C) (Oral)  Resp 16  Ht 5\' 7"  (1.702 m)  Wt 203 lb (92.08 kg)  BMI 31.79 kg/m2 GEN- NAD, alert and oriented x3, hair loss scalp, brows,  HEENT- PERRL, EOMI, non injected sclera, pink conjunctiva, MMM, oropharynx clear Neck- Supple, no LAD Skin- mild swelling beneath right eye and erythenma of upper lid and beneath right eye toward cheek area, no induration, no abscess, NT CVS- RRR, no murmur RESP-decreased at bases        Assessment & Plan:      Problem List Items Addressed This Visit    None    Visit Diagnoses    Periorbital cellulitis of right eye    -  Primary    possible periorbital cellulitis, swelling has done down signficantly with warm compresses, with her history will cover with Clindamycin, no ocular  involvement, discussed to go to ER if swelling continues or worsens despiste antibiotics       Note: This dictation was prepared with Dragon dictation along with smaller phrase technology. Any transcriptional errors that result from this process are unintentional.

## 2013-11-25 NOTE — Telephone Encounter (Signed)
Pt is aware and showed understand. Pt made an appt to see Dr. Sima Matas

## 2013-12-01 ENCOUNTER — Encounter: Payer: Self-pay | Admitting: Internal Medicine

## 2013-12-01 ENCOUNTER — Other Ambulatory Visit: Payer: Self-pay

## 2013-12-01 ENCOUNTER — Telehealth: Payer: Self-pay | Admitting: Internal Medicine

## 2013-12-01 ENCOUNTER — Ambulatory Visit: Payer: Self-pay | Admitting: Internal Medicine

## 2013-12-01 ENCOUNTER — Other Ambulatory Visit (HOSPITAL_BASED_OUTPATIENT_CLINIC_OR_DEPARTMENT_OTHER): Payer: Medicare Other

## 2013-12-01 ENCOUNTER — Ambulatory Visit (HOSPITAL_BASED_OUTPATIENT_CLINIC_OR_DEPARTMENT_OTHER): Payer: Medicare Other | Admitting: Internal Medicine

## 2013-12-01 VITALS — BP 108/73 | HR 88 | Temp 98.1°F | Resp 18 | Ht 67.0 in | Wt 202.4 lb

## 2013-12-01 DIAGNOSIS — C3491 Malignant neoplasm of unspecified part of right bronchus or lung: Secondary | ICD-10-CM

## 2013-12-01 DIAGNOSIS — C3490 Malignant neoplasm of unspecified part of unspecified bronchus or lung: Secondary | ICD-10-CM

## 2013-12-01 DIAGNOSIS — C3411 Malignant neoplasm of upper lobe, right bronchus or lung: Secondary | ICD-10-CM

## 2013-12-01 LAB — COMPREHENSIVE METABOLIC PANEL (CC13)
ALBUMIN: 3 g/dL — AB (ref 3.5–5.0)
ALT: 8 U/L (ref 0–55)
AST: 14 U/L (ref 5–34)
Alkaline Phosphatase: 69 U/L (ref 40–150)
Anion Gap: 7 mEq/L (ref 3–11)
BUN: 11.2 mg/dL (ref 7.0–26.0)
CALCIUM: 9 mg/dL (ref 8.4–10.4)
CO2: 27 meq/L (ref 22–29)
CREATININE: 0.8 mg/dL (ref 0.6–1.1)
Chloride: 98 mEq/L (ref 98–109)
Glucose: 103 mg/dl (ref 70–140)
Potassium: 5.1 mEq/L (ref 3.5–5.1)
Sodium: 133 mEq/L — ABNORMAL LOW (ref 136–145)
Total Bilirubin: 0.3 mg/dL (ref 0.20–1.20)
Total Protein: 6.3 g/dL — ABNORMAL LOW (ref 6.4–8.3)

## 2013-12-01 LAB — CBC WITH DIFFERENTIAL/PLATELET
BASO%: 0.1 % (ref 0.0–2.0)
BASOS ABS: 0 10*3/uL (ref 0.0–0.1)
EOS ABS: 0 10*3/uL (ref 0.0–0.5)
EOS%: 0.4 % (ref 0.0–7.0)
HEMATOCRIT: 29.8 % — AB (ref 34.8–46.6)
HGB: 9.5 g/dL — ABNORMAL LOW (ref 11.6–15.9)
LYMPH#: 0.6 10*3/uL — AB (ref 0.9–3.3)
LYMPH%: 9 % — ABNORMAL LOW (ref 14.0–49.7)
MCH: 32.4 pg (ref 25.1–34.0)
MCHC: 31.9 g/dL (ref 31.5–36.0)
MCV: 101.7 fL — ABNORMAL HIGH (ref 79.5–101.0)
MONO#: 0.4 10*3/uL (ref 0.1–0.9)
MONO%: 5.5 % (ref 0.0–14.0)
NEUT%: 85 % — ABNORMAL HIGH (ref 38.4–76.8)
NEUTROS ABS: 5.7 10*3/uL (ref 1.5–6.5)
Platelets: 216 10*3/uL (ref 145–400)
RBC: 2.93 10*6/uL — ABNORMAL LOW (ref 3.70–5.45)
RDW: 17.1 % — AB (ref 11.2–14.5)
WBC: 6.7 10*3/uL (ref 3.9–10.3)

## 2013-12-01 LAB — MAGNESIUM (CC13): Magnesium: 2.3 mg/dl (ref 1.5–2.5)

## 2013-12-01 MED ORDER — OXYCODONE-ACETAMINOPHEN 5-325 MG PO TABS: 1.0000 | ORAL_TABLET | Freq: Three times a day (TID) | ORAL | Status: DC | PRN

## 2013-12-01 NOTE — Patient Instructions (Signed)
Smoking Cessation, Tips for Success  If you are ready to quit smoking, congratulations! You have chosen to help yourself be healthier. Cigarettes bring nicotine, tar, carbon monoxide, and other irritants into your body. Your lungs, heart, and blood vessels will be able to work better without these poisons. There are many different ways to quit smoking. Nicotine gum, nicotine patches, a nicotine inhaler, or nicotine nasal spray can help with physical craving. Hypnosis, support groups, and medicines help break the habit of smoking.  WHAT THINGS CAN I DO TO MAKE QUITTING EASIER?   Here are some tips to help you quit for good:  · Pick a date when you will quit smoking completely. Tell all of your friends and family about your plan to quit on that date.  · Do not try to slowly cut down on the number of cigarettes you are smoking. Pick a quit date and quit smoking completely starting on that day.  · Throw away all cigarettes.    · Clean and remove all ashtrays from your home, work, and car.  · On a card, write down your reasons for quitting. Carry the card with you and read it when you get the urge to smoke.  · Cleanse your body of nicotine. Drink enough water and fluids to keep your urine clear or pale yellow. Do this after quitting to flush the nicotine from your body.  · Learn to predict your moods. Do not let a bad situation be your excuse to have a cigarette. Some situations in your life might tempt you into wanting a cigarette.  · Never have "just one" cigarette. It leads to wanting another and another. Remind yourself of your decision to quit.  · Change habits associated with smoking. If you smoked while driving or when feeling stressed, try other activities to replace smoking. Stand up when drinking your coffee. Brush your teeth after eating. Sit in a different chair when you read the paper. Avoid alcohol while trying to quit, and try to drink fewer caffeinated beverages. Alcohol and caffeine may urge you to  smoke.  · Avoid foods and drinks that can trigger a desire to smoke, such as sugary or spicy foods and alcohol.  · Ask people who smoke not to smoke around you.  · Have something planned to do right after eating or having a cup of coffee. For example, plan to take a walk or exercise.  · Try a relaxation exercise to calm you down and decrease your stress. Remember, you may be tense and nervous for the first 2 weeks after you quit, but this will pass.  · Find new activities to keep your hands busy. Play with a pen, coin, or rubber band. Doodle or draw things on paper.  · Brush your teeth right after eating. This will help cut down on the craving for the taste of tobacco after meals. You can also try mouthwash.    · Use oral substitutes in place of cigarettes. Try using lemon drops, carrots, cinnamon sticks, or chewing gum. Keep them handy so they are available when you have the urge to smoke.  · When you have the urge to smoke, try deep breathing.  · Designate your home as a nonsmoking area.  · If you are a heavy smoker, ask your health care provider about a prescription for nicotine chewing gum. It can ease your withdrawal from nicotine.  · Reward yourself. Set aside the cigarette money you save and buy yourself something nice.  · Look for   support from others. Join a support group or smoking cessation program. Ask someone at home or at work to help you with your plan to quit smoking.  · Always ask yourself, "Do I need this cigarette or is this just a reflex?" Tell yourself, "Today, I choose not to smoke," or "I do not want to smoke." You are reminding yourself of your decision to quit.  · Do not replace cigarette smoking with electronic cigarettes (commonly called e-cigarettes). The safety of e-cigarettes is unknown, and some may contain harmful chemicals.  · If you relapse, do not give up! Plan ahead and think about what you will do the next time you get the urge to smoke.  HOW WILL I FEEL WHEN I QUIT SMOKING?  You  may have symptoms of withdrawal because your body is used to nicotine (the addictive substance in cigarettes). You may crave cigarettes, be irritable, feel very hungry, cough often, get headaches, or have difficulty concentrating. The withdrawal symptoms are only temporary. They are strongest when you first quit but will go away within 10-14 days. When withdrawal symptoms occur, stay in control. Think about your reasons for quitting. Remind yourself that these are signs that your body is healing and getting used to being without cigarettes. Remember that withdrawal symptoms are easier to treat than the major diseases that smoking can cause.   Even after the withdrawal is over, expect periodic urges to smoke. However, these cravings are generally short lived and will go away whether you smoke or not. Do not smoke!  WHAT RESOURCES ARE AVAILABLE TO HELP ME QUIT SMOKING?  Your health care provider can direct you to community resources or hospitals for support, which may include:  · Group support.  · Education.  · Hypnosis.  · Therapy.  Document Released: 09/16/2003 Document Revised: 05/04/2013 Document Reviewed: 06/05/2012  [employer]® Patient Information ©2015 [employer]. This information is not intended to replace advice given to you by your health care provider. Make sure you discuss any questions you have with your health care provider.

## 2013-12-01 NOTE — Progress Notes (Signed)
[place] [phone]   [phone]  OFFICE PROGRESS NOTE  Vic Blackbird, MD [address] [place] [address]  DIAGNOSIS: Small cell carcinoma of lung  Primary site: Lung (Right)  Staging method: AJCC 7th Edition  Clinical: Stage IIA (T1a, N1, M0) signed by Curt Bears, MD on 08/06/2013 2:02 PM  Summary: Stage IIA (T1a, N1, M0)  PRIOR THERAPY: Systemic chemotherapy with cisplatin 60 mg/m2 on day1, etoposide 120 mg/m2 on days 1, 2, and 3 with neulasta support on day 4. Status post 4 cycles. This was concurrent with radiation which was terminated early because of intolerance.  CURRENT THERAPY: None.  INTERVAL HISTORY: Tonya Sullivan 58 y.o. female returns to the clinic today for follow-up visit. The patient has rough time with her previous treatment with cisplatin and etoposide as well as the concurrent radiation. We were able to get her through 4 cycles of this treatment. She is feeling much better today except for the generalized fatigue and aching pain in the lower back and hip area that she relates to her previous Neulasta injection several weeks ago. She denied having any significant chest pain but continues to have shortness of breath with exertion with no cough or hemoptysis. The patient denied having any significant weight loss or night sweats. She has no nausea or vomiting, no fever or chills. She had repeat CT scan of the chest performed recently and she is here for evaluation and discussion of her scan results.  MEDICAL HISTORY: Past Medical History  Diagnosis Date  . COPD (chronic obstructive pulmonary disease)   . Depression   . Chronic back pain     DDD, disc bulge, radiculopathy, spinal stenosis  . Hyperlipidemia   . Anxiety   . Bipolar disorder, unspecified   . Carotid artery calcification   . DDD (degenerative disc disease), lumbosacral   . Spinal stenosis, lumbar   . Autoimmune thyroiditis   . Essential  hypertension, benign   . TIA (transient ischemic attack)     August 2014  . GERD (gastroesophageal reflux disease)   . Neuropathic pain   . Addison's disease     On Prednisone  . History of pneumonia   . Achalasia   . Frequent falls   . Hypothyroidism   . Melanoma   . 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 DM (diabetes mellitus) with complications     ALLERGIES:  is allergic to codeine; cyclobenzaprine; darvocet; abilify; ace inhibitors; adhesive; gabapentin; iodine; levofloxacin; prednisone; sulfa antibiotics; and robaxin.  MEDICATIONS:  Current Outpatient Prescriptions  Medication Sig Dispense Refill  . albuterol (PROVENTIL HFA;VENTOLIN HFA) 108 (90 BASE) MCG/ACT inhaler Inhale 2 puffs into the lungs 3 (three) times daily as needed for wheezing or shortness of breath. 1 Inhaler 0  . albuterol (PROVENTIL) (2.5 MG/3ML) 0.083% nebulizer solution Take 3 mLs (2.5 mg total) by nebulization every 4 (four) hours as needed for wheezing or shortness of breath. 75 mL 1  . amitriptyline (ELAVIL) 100 MG tablet Take 1 tablet (100 mg total) by mouth at bedtime. 30 tablet 2  . cefpodoxime (VANTIN) 200 MG tablet Take 1 tablet (200 mg total) by mouth 2 (two) times daily. 16 tablet 0  . clindamycin (CLEOCIN) 300 MG capsule Take 1 capsule (300 mg total) by mouth 3 (three) times daily. 21 capsule 0  . clonazePAM (KLONOPIN) 1 MG tablet Take 1 tablet (1 mg total) by mouth 4 (four) times daily. 120 tablet  2  . dexlansoprazole (DEXILANT) 60 MG capsule Take 1 capsule (60 mg total) by mouth daily. 30 capsule 3  . diphenhydrAMINE-zinc acetate (BENADRYL) cream Apply topically 3 (three) times daily as needed for itching. Apply to affected area 28.4 g 0  . divalproex (DEPAKOTE ER) 500 MG 24 hr tablet Take 1 tablet (500 mg total) by mouth daily. 30 tablet 2  . donepezil (ARICEPT) 5 MG tablet Take 1 tablet (5 mg total) by mouth at bedtime. 30 tablet 3  . DULoxetine (CYMBALTA) 60 MG capsule Take 1 capsule (60 mg  total) by mouth every morning. 30 capsule 2  . EPINEPHrine (EPIPEN) 0.3 mg/0.3 mL IJ SOAJ injection Inject 0.3 mLs (0.3 mg total) into the muscle once. PRN FOR SEVERE ALLERGIC REACTION 1 Device 6  . feeding supplement, ENSURE COMPLETE, (ENSURE COMPLETE) LIQD Take 237 mLs by mouth daily. 10 Bottle 0  . fentaNYL (DURAGESIC) 75 MCG/HR Place 1 patch (75 mcg total) onto the skin every 3 (three) days. 5 patch 0  . guaiFENesin (MUCINEX) 600 MG 12 hr tablet Take 1 tablet (600 mg total) by mouth 2 (two) times daily as needed for cough or to loosen phlegm. 45 tablet 0  . levothyroxine (SYNTHROID, LEVOTHROID) 25 MCG tablet Take 25 mcg by mouth at bedtime.     . lidocaine (LIDODERM) 5 % Place 1 patch onto the skin daily. Remove & Discard patch within 12 hours or as directed by MD    . lidocaine-prilocaine (EMLA) cream Apply 1 application topically as needed. Apply to port 1 hr before chemo (Patient taking differently: Apply 1 application topically as needed (Apply to port 1 hr before chemo). ) 30 g 0  . loratadine (CLARITIN) 10 MG tablet Take 1 tablet (10 mg total) by mouth daily. 30 tablet 0  . metoprolol tartrate (LOPRESSOR) 25 MG tablet Take take 1/4 of a tablet twice a day 180 tablet 3  . ondansetron (ZOFRAN) 4 MG tablet Take 1 tablet (4 mg total) by mouth every 6 (six) hours as needed for refractory nausea / vomiting. 20 tablet 0  . oxyCODONE-acetaminophen (PERCOCET/ROXICET) 5-325 MG per tablet Take 1 tablet by mouth 3 (three) times daily as needed for severe pain. 60 tablet 0  . pantoprazole (PROTONIX) 40 MG tablet TAKE (1) TABLET BY MOUTH DAILY. 30 tablet 6  . pravastatin (PRAVACHOL) 40 MG tablet Take 40 mg by mouth daily.    . predniSONE (DELTASONE) 2.5 MG tablet Take 3 tablets (7.5 mg total) by mouth daily with breakfast.    . prochlorperazine (COMPAZINE) 10 MG tablet Take 1 tablet (10 mg total) by mouth every 6 (six) hours as needed for nausea or vomiting. 30 tablet 0  . Tiotropium Bromide Monohydrate  (SPIRIVA RESPIMAT) 2.5 MCG/ACT AERS Inhale 2 puffs into the lungs daily.    . [DISCONTINUED] Gabapentin, PHN, (GRALISE STARTER) 300 & 600 MG MISC Take 300-1,800 mg by mouth as directed. 15 DAY Starter pack doses     DAY 1- 300 mg     DAY 2- 600 mg     DAY 3 to 6 - 900 mg    DAY 7 to 10 - 1200 mg     DAY 11 to 14 - 1500 mg      DAY 15 - 1800 mg      No current facility-administered medications for this visit.    SURGICAL HISTORY:  Past Surgical History  Procedure Laterality Date  . Appendectomy    . Cholecystectomy    .  Lump left breast      Benign  . Abdominal hysterectomy      Tubal pregnancy  . Inguinal hernia repair    . Stomach surgery    . Ectopic pregnancy surgery    . Back surgery      x 5;1984;1989;;1999;2000;2010  . Colonoscopy  01/25/2012    Procedure: COLONOSCOPY;  Surgeon: Rogene Houston, MD;  Location: [facility];  Service: Endoscopy;  Laterality: N/A;  130  . Esophagogastroduodenoscopy (egd) with esophageal dilation N/A 08/13/2012    Procedure: ESOPHAGOGASTRODUODENOSCOPY (EGD) WITH ESOPHAGEAL DILATION;  Surgeon: Rogene Houston, MD;  Location: [facility];  Service: Endoscopy;  Laterality: N/A;  315  . Anterior cervical decomp/discectomy fusion N/A 10/21/2012    Procedure: Cervical Six-Seven Anterior cervical decompression/diskectomy/fusion;  Surgeon: Kristeen Miss, MD;  Location: [facility];  Service: Neurosurgery;  Laterality: N/A;  Cervical Six-Seven Anterior cervical decompression/diskectomy/fusion  . Cataract extraction w/phaco Right 11/25/2012    Procedure: CATARACT EXTRACTION PHACO AND INTRAOCULAR LENS PLACEMENT (IOC);  Surgeon: Elta Guadeloupe T. Gershon Crane, MD;  Location: [facility];  Service: Ophthalmology;  Laterality: Right;  CDE:10.06  . Cataract extraction w/phaco Left 12/09/2012    Procedure: CATARACT EXTRACTION PHACO AND INTRAOCULAR LENS PLACEMENT (IOC);  Surgeon: Elta Guadeloupe T. Gershon Crane, MD;  Location: [facility];  Service: Ophthalmology;  Laterality: Left;  CDE:5.06  .  Tonsillectomy    . Hiatal hernia repair    . Heller myotomy N/A 03/17/2013    Procedure: DIAGNOSTIC LAPAROSCOPY, LAPAROSCOPIC HELLER MYOTOMY, ENDOSCOPY, DOR FUNDOPLICATION;  Surgeon: Ralene Ok, MD;  Location: [facility];  Service: General;  Laterality: N/A;  . Breast surgery      Left breast  . Eye surgery    . Video bronchoscopy with endobronchial ultrasound N/A 07/24/2013    Procedure: VIDEO BRONCHOSCOPY WITH ENDOBRONCHIAL ULTRASOUND;  Surgeon: [name], MD;  Location: [facility];  Service: Thoracic;  Laterality: N/A;    REVIEW OF SYSTEMS:  Constitutional: positive for fatigue Eyes: negative Ears, nose, mouth, throat, and face: negative Respiratory: positive for dyspnea on exertion Cardiovascular: negative Gastrointestinal: negative Genitourinary:negative Integument/breast: negative Hematologic/lymphatic: negative Musculoskeletal:positive for bone pain Neurological: negative Behavioral/Psych: negative Endocrine: negative Allergic/Immunologic: negative   PHYSICAL EXAMINATION: General appearance: alert, cooperative, fatigued and no distress Head: Normocephalic, without obvious abnormality, atraumatic Neck: no adenopathy, no JVD, supple, symmetrical, trachea midline and thyroid not enlarged, symmetric, no tenderness/mass/nodules Lymph nodes: Cervical, supraclavicular, and axillary nodes normal. Resp: clear to auscultation bilaterally Back: symmetric, no curvature. ROM normal. No CVA tenderness. Cardio: regular rate and rhythm, S1, S2 normal, no murmur, click, rub or gallop GI: soft, non-tender; bowel sounds normal; no masses,  no organomegaly Extremities: extremities normal, atraumatic, no cyanosis or edema Neurologic: Alert and oriented X 3, normal strength and tone. Normal symmetric reflexes. Normal coordination and gait  ECOG PERFORMANCE STATUS: 1 - Symptomatic but completely ambulatory  There were no vitals taken for this visit.  LABORATORY DATA: Lab Results  Component  Value Date   WBC 6.7 12/01/2013   HGB 9.5* 12/01/2013   HCT 29.8* 12/01/2013   MCV 101.7* 12/01/2013   PLT 216 12/01/2013      Chemistry      Component Value Date/Time   NA 137 11/16/2013 1526   NA 135* 11/03/2013 1426   K 4.5 11/16/2013 1526   K 5.2* 11/03/2013 1426   CL 98 11/16/2013 1526   CO2 30 11/16/2013 1526   CO2 29 11/03/2013 1426   BUN 5* 11/16/2013 1526   BUN 14.3  11/03/2013 1426   CREATININE 0.84 11/16/2013 1526   CREATININE 0.72 11/09/2013 0500   CREATININE 0.8 11/03/2013 1426      Component Value Date/Time   CALCIUM 8.6 11/16/2013 1526   CALCIUM 9.0 11/03/2013 1426   ALKPHOS 120* 11/16/2013 1526   ALKPHOS 101 11/03/2013 1426   AST 11 11/16/2013 1526   AST 10 11/03/2013 1426   ALT <8 11/16/2013 1526   ALT 9 11/03/2013 1426   BILITOT 0.2 11/16/2013 1526   BILITOT 0.46 11/03/2013 1426       RADIOGRAPHIC STUDIES: Dg Lumbar Spine Complete  11/06/2013   CLINICAL DATA:  Pt has a hx of lung cancer. She fell yesterday onto her left side. She is having some low back pain and hip joint pain since her fall. Hx of 5 back surgeries  EXAM: LUMBAR SPINE - COMPLETE 4+ VIEW  COMPARISON:  05/23/2013  FINDINGS: No fracture.  No spondylolisthesis.  Bilateral pedicle screws and interconnecting rods diffuse L3-L4. There are intervertebral metallic cages at L4 -L5 and L5-S1. Radiolucent disc spacer and bone graft material fuses the L3-L4 disc.  There is moderate loss of disc height at L2-L3 with endplate sclerosis and osteophytes.  Soft tissues show aortic vascular calcifications but are otherwise unremarkable.  No change from the prior radiographs.  IMPRESSION: 1. Postsurgical and degenerative changes, stable from the prior lumbar spine radiographs. 2. No fracture or acute finding.   Electronically Signed   By: Lajean Manes M.D.   On: 11/06/2013 20:38   Dg Hip Bilateral W/pelvis  11/06/2013   CLINICAL DATA:  Pt has a hx of lung cancer. She fell yesterday onto her left side. She is  having some low back pain and hip joint pain since her fall. Hx of 5 back surgeries  EXAM: BILATERAL HIP WITH PELVIS - 4+ VIEW  COMPARISON:  CT, 12/31/2011  FINDINGS: No fracture. Hip joints are normally spaced and aligned with no arthropathic change. There is mild irregular sclerosis along the left lateral ilium consistent with a previous bone harvesting site. Bony pelvis otherwise unremarkable. There are changes from lumbar spine fusion, stable from the prior CT.  IMPRESSION: No fracture.  No hip joint abnormality.   Electronically Signed   By: Lajean Manes M.D.   On: 11/06/2013 20:36   Ct Head Wo Contrast  11/06/2013   CLINICAL DATA:  was referred from her office for direct admission for evaluation of progressive weakness and falls. Patient reported feeling weak for past 1 week prior to this admission and had an episode of fall yesterday. She reports not hitting her head but felt pain in her hips and back. She reported that "legs gave out on her". She also reported lightheadedness but no loss of consciousness prior to the fall.  EXAM: CT HEAD WITHOUT CONTRAST  TECHNIQUE: Contiguous axial images were obtained from the base of the skull through the vertex without intravenous contrast.  COMPARISON:  05/23/2013  FINDINGS: Ventricles are normal in configuration. There is ventricular and sulcal enlargement reflecting mild atrophy.  No parenchymal masses or mass effect. There is no evidence of an infarct.  There are no extra-axial masses or abnormal fluid collections.  No intracranial hemorrhage.  Visualized sinuses and mastoid air cells are clear. No skull lesion.  No change from the prior study.  IMPRESSION: 1. No acute intracranial abnormalities. 2. Mild atrophy.  Stable appearance from the prior study.   Electronically Signed   By: Lajean Manes M.D.   On: 11/06/2013 20:41  Ct Chest W Contrast  11/20/2013   CLINICAL DATA:  Lung cancer.  Status post chemotherapy and XRT.  EXAM: CT CHEST WITH CONTRAST   TECHNIQUE: Multidetector CT imaging of the chest was performed during intravenous contrast administration.  CONTRAST:  69mL OMNIPAQUE IOHEXOL 300 MG/ML  SOLN  COMPARISON:  08/20/2013  FINDINGS: Mediastinum: Normal heart size. There is no pericardial effusion. Calcified atherosclerotic disease involves the thoracic aorta as well as the LAD and left circumflex coronary arteries. The index right hilar lesion measures 1.5 x 0.8 cm, image 25/series 2. Previously 4 x 3.5 cm.  Lungs/Pleura: There is no pleural effusion. Atelectasis and scarring involving the right middle lobe is identified. This is similar to previous exam. Nodule in the right upper lobe measures 0.6 cm, image 17/series 5. Previously 1.1 cm. No new or enlarging pulmonary nodules or mass is identified.  Upper Abdomen: Stable low attenuation structure within the medial segment of left lobe of liver. The adrenal glands are both normal. No acute findings and no evidence for metastatic disease identified.  Musculoskeletal: Review of the visualized osseous structures is on unremarkable. No aggressive lytic or sclerotic bone lesions.  IMPRESSION: 1. No acute findings. There is been interval response to therapy with significant decrease in size of right hilar lesion. 2. The right upper lobe pulmonary nodule has also decreased in size in the interval. 3. No new or progressive tumor identified.   Electronically Signed   By: Kerby Moors M.D.   On: 11/20/2013 16:05   Dg Chest Port 1 View  11/09/2013   CLINICAL DATA:  Wheezing  EXAM: PORTABLE CHEST - 1 VIEW  COMPARISON:  09/15/2013  FINDINGS: Cardiac shadow is stable. A right chest wall port is again seen. Some linear atelectasis is noted in the right mid lung. The right hilar mass lesion is again noted. The left lung remains clear. No bony abnormality is noted.  IMPRESSION: Mild right mid lung atelectasis.   Electronically Signed   By: Inez Catalina M.D.   On: 11/09/2013 10:41    ASSESSMENT AND PLAN: This is a  very pleasant 58 years old white female with history of limited stage small cell lung cancer status post 4 cycles of systemic chemotherapy with cisplatin and etoposide concurrent with radiation which was terminated early secondary to intolerance of the combined treatment. Her recent CT scan of the chest showed significant improvement in her disease. I discussed the scan results with the patient today. I recommended for her to continue on observation with repeat CT scan of the chest in 3 months.  The patient has an appointment with Dr. Pablo Ledger next months and they will discuss the prophylactic cranial irradiation. For pain management I gave her a refill of Percocet. The patient was advised to call immediately if she has any concerning symptoms in the interval. The patient voices understanding of current disease status and treatment options and is in agreement with the current care plan.  All questions were answered. The patient knows to call the clinic with any problems, questions or concerns. We can certainly see the patient much sooner if necessary.  I spent 15 minutes counseling the patient face to face. The total time spent in the appointment was 25 minutes.  Disclaimer: This note was dictated with voice recognition software. Similar sounding words can inadvertently be transcribed and may not be corrected upon review.

## 2013-12-01 NOTE — Telephone Encounter (Signed)
Pt confirmed labs/ov per 12/01 POF, gave pt AVS.... KJ

## 2013-12-02 ENCOUNTER — Ambulatory Visit (HOSPITAL_COMMUNITY): Payer: Self-pay | Admitting: Psychiatry

## 2013-12-04 ENCOUNTER — Telehealth: Payer: Self-pay | Admitting: *Deleted

## 2013-12-04 NOTE — Telephone Encounter (Signed)
Noted make sure she stays hydrated as diarrhea can cause dehydration if it comes back or she has blood in stool go to ER this weeekend

## 2013-12-04 NOTE — Telephone Encounter (Signed)
Call placed to patient and patient made aware.  

## 2013-12-04 NOTE — Telephone Encounter (Signed)
Received call from patient.   Reports that she was having episodes of diarrhea. States that she thinks this was caused by her ABTx.   MD made aware and states that patient should have completed ABTx.   Call placed to patient and was made aware that patient did not pick up prescription until (11) day after it was written. States that she completed ABTx today and she took anti-diarrheal this morning.   Reports that she has hd no episodes of diarrhea today.   MD to be made aware .

## 2013-12-07 ENCOUNTER — Telehealth: Payer: Self-pay | Admitting: Family Medicine

## 2013-12-07 MED ORDER — FENTANYL 75 MCG/HR TD PT72: 75.0000 ug | MEDICATED_PATCH | TRANSDERMAL | Status: DC

## 2013-12-07 NOTE — Telephone Encounter (Signed)
Patient left message about getting refill on i think she said patches? Please call her back at [phone]

## 2013-12-07 NOTE — Telephone Encounter (Signed)
Ok to refill??  Last office visit 11/25/2013.  Last refill 11/16/2013, #5 patches.

## 2013-12-07 NOTE — Telephone Encounter (Signed)
Prescription printed and patient made aware to come to office to pick up.  

## 2013-12-07 NOTE — Telephone Encounter (Signed)
Okay to refill duragesic patch

## 2013-12-08 ENCOUNTER — Telehealth: Payer: Self-pay | Admitting: Family Medicine

## 2013-12-08 NOTE — Telephone Encounter (Signed)
Call placed to patient. LMTRC.  

## 2013-12-08 NOTE — Telephone Encounter (Signed)
Pt states she is having something like a diarrhea every since she has had her radiation treatment  [phone]

## 2013-12-09 NOTE — Telephone Encounter (Signed)
Call placed to patient. LMTRC.  

## 2013-12-10 NOTE — Telephone Encounter (Signed)
Multiple calls placed to patient with no answer and no return call.   Message to be closed.  

## 2013-12-14 ENCOUNTER — Ambulatory Visit (INDEPENDENT_AMBULATORY_CARE_PROVIDER_SITE_OTHER): Payer: Self-pay | Admitting: Internal Medicine

## 2013-12-15 ENCOUNTER — Ambulatory Visit (INDEPENDENT_AMBULATORY_CARE_PROVIDER_SITE_OTHER): Payer: Medicare Other | Admitting: Psychiatry

## 2013-12-15 DIAGNOSIS — F332 Major depressive disorder, recurrent severe without psychotic features: Secondary | ICD-10-CM

## 2013-12-15 NOTE — Progress Notes (Signed)
     THERAPIST PROGRESS NOTE  Session Time: Tuesday 12/15/2013 11:05 AM - 11:35 AM  Participation Level: Active  Behavioral Response: CasualAlertAnxious  Type of Therapy: Individual Therapy  Treatment Goals addressed: Improve ability to manage stress and anxiety using coping skills and relaxation techniques   Interventions: CBT and Supportive  Summary: Tonya Sullivan is a 59 y.o. female who presents with a long-standing history of depression beginning in adolescence. Patient also has significant trauma history. Patient also experienced grief and loss issues due to to the death of her husband in 10-06-2010. She continues to experience stress related to ongoing health issues as well as family problems.She was hospitalized at the [facility] in May 2015 due to reaction to medication and depression  Patient reports continued health issues and being unable to have any more chemotherapy and radiation treatment due to detrimental effects on patient per her report. She states accepting this and sometimes being sad but denies being depressed. She states focusing on trying to enjoy life now. She states feeling as though she is handling things pretty well. Her brother and sister-in-law living in [place] plan to visit patient next week for Christmas. Patient states really wanting to enjoy the holidays with family this year because she may not be around next Christmas. Patient reports strong support from her son, his girlfriend, her mother, and her siblings. Patient does remain cautious in the relationship with her son's girlfriend and continues to set and maintain boundaries in the relationship. She reports her sister wants to stay with patient during the holidays. She expresses ambivalent feelings regarding this as sister has her own mental health issues.  Suicidal/Homicidal: No  Therapist Response: Therapist works with patient to process feelings regarding illness , identify ways to  use support system, praise her use of assertiveness skills.   Plan: Return again in 4 weeks.  Diagnosis: Axis I: Major Depression, Recurrent severe    Axis II: Deferred    Mistey Hoffert, LCSW 12/15/2013

## 2013-12-15 NOTE — Patient Instructions (Signed)
Discussed orally 

## 2013-12-22 ENCOUNTER — Encounter: Payer: Self-pay | Admitting: Family Medicine

## 2013-12-22 ENCOUNTER — Other Ambulatory Visit: Payer: Self-pay | Admitting: Family Medicine

## 2013-12-22 ENCOUNTER — Ambulatory Visit (INDEPENDENT_AMBULATORY_CARE_PROVIDER_SITE_OTHER): Payer: Self-pay | Admitting: Internal Medicine

## 2013-12-22 ENCOUNTER — Ambulatory Visit (INDEPENDENT_AMBULATORY_CARE_PROVIDER_SITE_OTHER): Payer: Medicare Other | Admitting: Family Medicine

## 2013-12-22 VITALS — BP 136/74 | HR 80 | Temp 97.7°F | Resp 18 | Ht 67.0 in | Wt 203.0 lb

## 2013-12-22 DIAGNOSIS — G8929 Other chronic pain: Secondary | ICD-10-CM

## 2013-12-22 DIAGNOSIS — I6529 Occlusion and stenosis of unspecified carotid artery: Secondary | ICD-10-CM

## 2013-12-22 DIAGNOSIS — M549 Dorsalgia, unspecified: Secondary | ICD-10-CM

## 2013-12-22 DIAGNOSIS — R159 Full incontinence of feces: Secondary | ICD-10-CM

## 2013-12-22 DIAGNOSIS — R000 Tachycardia, unspecified: Secondary | ICD-10-CM

## 2013-12-22 DIAGNOSIS — E2740 Unspecified adrenocortical insufficiency: Secondary | ICD-10-CM

## 2013-12-22 MED ORDER — OXYCODONE-ACETAMINOPHEN 7.5-325 MG PO TABS: 1.0000 | ORAL_TABLET | Freq: Four times a day (QID) | ORAL | Status: DC | PRN

## 2013-12-22 NOTE — Assessment & Plan Note (Signed)
She has a long history of chronic pain I party advised her when she started becoming my patient and I would not keep her on high-dose narcotics. I have been treating her pain recently because of her cancer diagnosis but now she is getting increased pain requiring higher doses of narcotics. I'm not going to increase both the fentanyl as well as her Percocet and I find it quite odd that her sister is here today also trying to back her about why she needs higher doses of pain medication. After discussion we will keep her fentanyl patch at 75 g I offered to increase this to keep her baseline pain better but she wanted to high dose of Percocet I will keep her at 60 per month but she will be increased to Percocet 7.5 mg twice a day his car guardian understands that  If her pain is still uncontrolled she will need to go back to the pain clinic  I've had multiple concerns which she knows about regarding her pain medication use as well as her being admitted to the hospital and other times  She was seen with concern for overuse of pain medication in the past.

## 2013-12-22 NOTE — Telephone Encounter (Signed)
Refill appropriate and filled per protocol. 

## 2013-12-22 NOTE — Assessment & Plan Note (Addendum)
Refer back to Dr. Dorris Fetch

## 2013-12-22 NOTE — Progress Notes (Signed)
Patient ID: Tonya Sullivan, female   DOB: September 15, 1955, 58 y.o.   MRN: [id number]   Subjective:    Patient ID: Tonya Sullivan, female    DOB: 1955/07/02, 58 y.o.   MRN: [id number]  Patient presents for Medication Review/ Refill  patient here to follow-up medications. She was seen by oncology she was unable to tolerate her chemotherapy treatments. She will be seen by the radiation oncologist to discuss possible prophylactic irradiation of her brain because of the type of cancer. She continues to have severe bony pain as mentioned her hips and back but she has chronic severe back pain which has  Been treated for many years. She is here today with her sister who moved in to help R that her sister is actually disabled herself. They both were asking about her pain medication. I did receive an from [facility]  Asking that her pain medication be increased.  She is otherwise taking her medicines as prescribed. The only thing else that was new is that she has had some anal leakage for the past 4 weeks. She was taking some type of over-the-counter herbal colon cleanse when she  Stopped the medication her symptoms did improve within a week ago she noticed some more leakage as well as loose bowels. She denies feeling constipated or straining    Review Of Systems:  GEN- denies fatigue, fever, weight loss,weakness, recent illness HEENT- denies eye drainage, change in vision, nasal discharge, CVS- denies chest pain, palpitations RESP- denies SOB, cough, wheeze ABD- denies N/V, +change in stools, abd pain GU- denies dysuria, hematuria, dribbling, incontinence MSK- denies joint pain, muscle aches, injury Neuro- denies headache, dizziness, syncope, seizure activity       Objective:    BP 136/74 mmHg  Pulse 80  Temp(Src) 97.7 F (36.5 C) (Oral)  Resp 18  Ht 5\' 7"  (1.702 m)  Wt 203 lb (92.08 kg)  BMI 31.79 kg/m2 GEN- NAD, alert and oriented x3 HEENT- PERRL, EOMI, non injected sclera,  pink conjunctiva, MMM, oropharynx clear CVS- RRR, no murmur RESP-CTAB ABD-NABS,soft,NT,ND Rectum- normal tone, FOBT neg, soft stool in vault, EXT- trace pedal edema Pulses- Radial 2+        Assessment & Plan:      Problem List Items Addressed This Visit    None    Visit Diagnoses    Fecal incontinence    -  Primary    Check KUB, concern she may have severe constipation with high dose opiates, if neg, will also set up with GI, stop colon cleanse    Relevant Orders       DG Abd 1 View       Note: This dictation was prepared with Dragon dictation along with smaller phrase technology. Any transcriptional errors that result from this process are unintentional.

## 2013-12-22 NOTE — Patient Instructions (Signed)
Percocet increased to 7.5mg  as needed for breakthrough pain Get the xray of your abdomen Continue your blood pressure medication We will get appointment with Dr. Dorris Fetch F/U  3 months

## 2013-12-23 ENCOUNTER — Telehealth: Payer: Self-pay | Admitting: *Deleted

## 2013-12-23 MED ORDER — METOPROLOL TARTRATE 25 MG PO TABS: ORAL_TABLET | ORAL | Status: AC

## 2013-12-23 NOTE — Telephone Encounter (Signed)
Patient returned call and made aware.

## 2013-12-23 NOTE — Telephone Encounter (Signed)
Call placed to Bradd Canary, NP to inquire.   Reports that she is filling patient medication box with Metoprolol Tartrate 50mg  (1/4) tab PO BID.   Call placed to patient.   Reports that medication bottle reads Metoprolol Tartrate 25mg  (1/2) tab PO BID. States that she has (2) bottles of Metoprolol, but the dosage is the same.     MD made aware and new orders obtained for Metoprolol Tartrate 25mg  (1/2) tab PO BID.  Pharmacy made aware.   Call placed to patient. [place].

## 2013-12-23 NOTE — Telephone Encounter (Signed)
Call placed to pharmacy.   Was advised that prescription for Metoprolol Tartrate reads as follows: Metoprolol Tartrate 25mg  (1/2) tab PO BID from Bradd Canary, NP in 06/2013.  New prescription given to pharmacy for Metoprolol Tartrate 25mg  (1/4) tab PO BID.

## 2013-12-23 NOTE — Telephone Encounter (Signed)
-----   Message from Alycia Rossetti, MD sent at 12/23/2013  8:59 AM EST ----- Regarding: Call Pt to verify meds   Please call pt and verify the metoprolol, if she has 25mg  twice a day, have her change to 1/2 tablet twice a day ( 12.5mg ) and we will see how she does.  Put in phone note

## 2013-12-23 NOTE — Telephone Encounter (Signed)
-----   Message from Tonya Rossetti, MD sent at 12/23/2013  8:48 AM EST ----- Regarding: Call pharmacy  I have that [place] has 25mg  of metoprolol and she is taking 1/4 tab twice a day ( dose 6.25)  Arbie Cookey sent a note statign she has 50mg  of metoprolol, and cutting it into 1/4 ( dose 12.5)  Can you verify with pharmacy what they have been dispensing so i can make sure she is taking the right dose

## 2013-12-28 ENCOUNTER — Telehealth: Payer: Self-pay | Admitting: *Deleted

## 2013-12-28 MED ORDER — AZITHROMYCIN 250 MG PO TABS: ORAL_TABLET | ORAL | Status: DC

## 2013-12-28 NOTE — Telephone Encounter (Signed)
O2sats 91%.

## 2013-12-28 NOTE — Assessment & Plan Note (Signed)
There has been confusion over her TOprol, I decreased dose down to 6.25mg  BID when she had significant Hypotension with her treatments as her HR would still be high and with her adrenal insuffiency and effects BB has worked well for her. We called and verified with pharmacy that she has been getting 25mg  tablets for past few months. I will change her back to 12.5mg  BID, she has had some difficulty with cutting tabs.

## 2013-12-28 NOTE — Telephone Encounter (Signed)
Prescription sent to pharmacy. .   Call placed to patient and patient made aware.  

## 2013-12-28 NOTE — Telephone Encounter (Signed)
Okay to send zpak, mucinex DM, use her inhalers and nebulizer

## 2013-12-28 NOTE — Telephone Encounter (Signed)
Received call from Bradd Canary, [facility] NP.  Reports that she was out to see patient and noted non-productive cough and chest congestion. States that when patient is able to get some of the congestion up, her sputum is tan in color. Patient is afebrile at this time and denies feer. NP states that patient has rhonchi noted to B lobes.   Reports that NP recommends ABTx.   MD please advise.

## 2013-12-29 ENCOUNTER — Ambulatory Visit (HOSPITAL_COMMUNITY): Payer: Self-pay | Admitting: Psychology

## 2013-12-29 ENCOUNTER — Telehealth: Payer: Self-pay | Admitting: *Deleted

## 2013-12-29 MED ORDER — FENTANYL 75 MCG/HR TD PT72: 75.0000 ug | MEDICATED_PATCH | TRANSDERMAL | Status: DC

## 2013-12-29 NOTE — Telephone Encounter (Signed)
Call placed to patient and patient made aware.  

## 2013-12-29 NOTE — Telephone Encounter (Signed)
Okay to refill Fentanyl patch

## 2013-12-29 NOTE — Telephone Encounter (Signed)
Ok to refill??  Last office visit 12/22/2013.  Last refill 12/07/2013.

## 2014-01-05 ENCOUNTER — Telehealth: Payer: Self-pay | Admitting: *Deleted

## 2014-01-05 MED ORDER — ONDANSETRON HCL 4 MG PO TABS: 4.0000 mg | ORAL_TABLET | Freq: Four times a day (QID) | ORAL | Status: AC | PRN

## 2014-01-05 NOTE — Telephone Encounter (Signed)
Okay to refill zofran

## 2014-01-05 NOTE — Telephone Encounter (Signed)
Pt calling to see if she can get a refill on the nausean and vomiting medication that was prescribed to her before, pt could not remember the name of it, I believe she is talking about the zofran.  [employer]

## 2014-01-05 NOTE — Telephone Encounter (Signed)
Script sent to pharmacy and pt aware. 

## 2014-01-06 ENCOUNTER — Ambulatory Visit (INDEPENDENT_AMBULATORY_CARE_PROVIDER_SITE_OTHER): Payer: Medicare Other | Admitting: Psychiatry

## 2014-01-06 DIAGNOSIS — F332 Major depressive disorder, recurrent severe without psychotic features: Secondary | ICD-10-CM

## 2014-01-06 NOTE — Patient Instructions (Signed)
Discussed orally 

## 2014-01-06 NOTE — Progress Notes (Signed)
      THERAPIST PROGRESS NOTE  Session Time: Wednesday 01/06/2014 1:05 PM - 1:40 PM  Participation Level: Active  Behavioral Response: CasualAlertAnxious  Type of Therapy: Individual Therapy  Treatment Goals addressed: Improve ability to manage stress and anxiety using coping skills and relaxation techniques   Interventions: CBT and Supportive  Summary: Tonya Sullivan is a 58 y.o. female who presents with a long-standing history of depression beginning in adolescence. Patient also has significant trauma history. Patient also experienced grief and loss issues due to to the death of her husband in 06-Oct-2010. She continues to experience stress related to ongoing health issues as well as family problems.She was hospitalized at the [facility] in May 2015 due to reaction to medication and depression  Patient reports increased stress, frustration, and anxiety related to her sister staying with her for the last three weeks. She also is experiencing sleep difficulty and gastrointestinal issues. She plans to see PCP this afternoon. Sister is scheduled to return to [place] Friday but is considering moving into the same apartment complex where patient resides.Patient also fears his sister's sons may try to move to this area. She reports additional stress related to her son's girlfriend who has reverted to old patterns of negative interaction with patient per her report. 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 Patient expresses ambivalent feelings about doctor's recommendation for patient to have radiation on her brain January 21st and 22nd. She states not planning to have treatment at this point but being open to hear more information from doctor at next appointment. Patient reports enjoying time she did have with brother and heis wife during their visit. She reports mother remains supportive.   Suicidal/Homicidal: No  Therapist Response: Therapist  works with patient to process feelings , identify ways to use support system, review relaxation techniques, and discuss ways to set boundaries.  Plan: Return again in 4 weeks.  Diagnosis: Axis I: Major Depression, Recurrent severe    Axis II: Deferred    Melanee Cordial, LCSW 01/06/2014

## 2014-01-08 ENCOUNTER — Encounter: Payer: Self-pay | Admitting: Family Medicine

## 2014-01-08 ENCOUNTER — Telehealth (HOSPITAL_COMMUNITY): Payer: Self-pay | Admitting: *Deleted

## 2014-01-08 ENCOUNTER — Ambulatory Visit (INDEPENDENT_AMBULATORY_CARE_PROVIDER_SITE_OTHER): Payer: Medicare Other | Admitting: Family Medicine

## 2014-01-08 ENCOUNTER — Encounter (HOSPITAL_COMMUNITY): Payer: Self-pay | Admitting: Psychiatry

## 2014-01-08 ENCOUNTER — Ambulatory Visit (INDEPENDENT_AMBULATORY_CARE_PROVIDER_SITE_OTHER): Payer: Medicare Other | Admitting: Psychiatry

## 2014-01-08 ENCOUNTER — Ambulatory Visit (HOSPITAL_COMMUNITY)
Admission: RE | Admit: 2014-01-08 | Discharge: 2014-01-08 | Disposition: A | Discharge status: Home / Self Care | Payer: Medicare Other | Source: Ambulatory Visit | Attending: Family Medicine | Admitting: Family Medicine

## 2014-01-08 VITALS — BP 124/59 | HR 109 | Ht 67.0 in | Wt 206.6 lb

## 2014-01-08 VITALS — BP 130/74 | HR 78 | Temp 99.0°F | Resp 18 | Ht 67.0 in | Wt 203.0 lb

## 2014-01-08 DIAGNOSIS — F332 Major depressive disorder, recurrent severe without psychotic features: Secondary | ICD-10-CM

## 2014-01-08 DIAGNOSIS — R159 Full incontinence of feces: Secondary | ICD-10-CM | POA: Diagnosis not present

## 2014-01-08 DIAGNOSIS — K5903 Drug induced constipation: Secondary | ICD-10-CM

## 2014-01-08 DIAGNOSIS — T50905A Adverse effect of unspecified drugs, medicaments and biological substances, initial encounter: Secondary | ICD-10-CM

## 2014-01-08 DIAGNOSIS — F419 Anxiety disorder, unspecified: Secondary | ICD-10-CM

## 2014-01-08 DIAGNOSIS — M7989 Other specified soft tissue disorders: Secondary | ICD-10-CM

## 2014-01-08 DIAGNOSIS — K5909 Other constipation: Secondary | ICD-10-CM

## 2014-01-08 MED ORDER — NALOXEGOL OXALATE 25 MG PO TABS: 1.0000 | ORAL_TABLET | Freq: Every day | ORAL | Status: AC

## 2014-01-08 MED ORDER — AMITRIPTYLINE HCL 100 MG PO TABS
ORAL_TABLET | ORAL | Status: DC
Start: 2014-01-08 — End: 2014-02-01

## 2014-01-08 MED ORDER — FUROSEMIDE 40 MG PO TABS: 40.0000 mg | ORAL_TABLET | Freq: Every day | ORAL | Status: DC

## 2014-01-08 MED ORDER — DIVALPROEX SODIUM ER 500 MG PO TB24: 500.0000 mg | ORAL_TABLET | Freq: Every day | ORAL | Status: AC

## 2014-01-08 MED ORDER — DULOXETINE HCL 60 MG PO CPEP: 60.0000 mg | ORAL_CAPSULE | Freq: Every morning | ORAL | Status: AC

## 2014-01-08 MED ORDER — CLONAZEPAM 1 MG PO TABS: 1.0000 mg | ORAL_TABLET | Freq: Four times a day (QID) | ORAL | Status: AC
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
## 2014-01-08 NOTE — Patient Instructions (Addendum)
Stop the anti-diarrhea medication Take lasix 40mg  1 po daily for the next 3 days, elevate your feet Drink fluids Appointment to be rescheduled with Dr. Laural Golden Take Movantik  F/U as previous

## 2014-01-10 DIAGNOSIS — M7989 Other specified soft tissue disorders: Secondary | ICD-10-CM | POA: Insufficient documentation

## 2014-01-10 DIAGNOSIS — K5903 Drug induced constipation: Secondary | ICD-10-CM | POA: Insufficient documentation

## 2014-01-10 NOTE — Assessment & Plan Note (Addendum)
I think the loose stool is constipation related, she has been on high dose opiods for many years Given samples of the new Movantik for OIC to use Rescheduled with GI  No acute abdomen

## 2014-01-10 NOTE — Progress Notes (Signed)
Patient ID: Tonya Sullivan, female   DOB: 1955/11/03, 59 y.o.   MRN: [id number]   Subjective:    Patient ID: Tonya Sullivan, female    DOB: [birth year], 59 y.o.   MRN: [id number]  Patient presents for Loose Stools  Pt here wit continued loose stools and fecal incontinence where she leaks stool, she was seen about 2 weeks ago for this, normal rectal tone, advised to get abd xray I was concerned she may have OIC, also scheduled with GI but she cancelled the GI appt herself. She has longstanding issues with her bowels. Here today with the same complain though she did go get the xray this morning which shows significant stool throughout the colon no obstruction, She denies abd pain.    Review Of Systems:  GEN- denies fatigue, fever, weight loss,weakness, recent illness HEENT- denies eye drainage, change in vision, nasal discharge, CVS- denies chest pain, palpitations RESP- denies SOB, cough, wheeze ABD- denies N/V, +change in stools, abd pain GU- denies dysuria, hematuria, dribbling, incontinence MSK- + joint pain, muscle aches, injury Neuro- denies headache, dizziness, syncope, seizure activity       Objective:    BP 130/74 mmHg  Pulse 78  Temp(Src) 99 F (37.2 C) (Oral)  Resp 18  Ht 5\' 7"  (1.702 m)  Wt 203 lb (92.08 kg)  BMI 31.79 kg/m2 GEN- NAD, alert and oriented x3 CVS- RRR, no murmur RESP-CTAB ABD-NABS,soft,NT,ND EXT- + 1+edema to midshins Pulses- Radial 2+        Assessment & Plan:      Problem List Items Addressed This Visit    None      Note: This dictation was prepared with Dragon dictation along with smaller phrase technology. Any transcriptional errors that result from this process are unintentional.

## 2014-01-10 NOTE — Assessment & Plan Note (Signed)
Will give lasix for next 3 days, advised to watch her salt intake, she is also on chronic prednisone which we can not change Elevate feet

## 2014-01-11 ENCOUNTER — Ambulatory Visit (HOSPITAL_COMMUNITY): Payer: Self-pay | Admitting: Psychology

## 2014-01-12 ENCOUNTER — Telehealth: Payer: Self-pay | Admitting: *Deleted

## 2014-01-12 NOTE — Telephone Encounter (Signed)
Received call from patient.   Reports that cough and hoarseness has worsened.   Reports that she is using inhalers and Mucinex.   Requested MD to advise.

## 2014-01-12 NOTE — Telephone Encounter (Signed)
Pt given zpak on 12/29 seen in office a few days ago, lungs clear, will not treat again Also told to d/c lasix was only to take for 3 days

## 2014-01-12 NOTE — Telephone Encounter (Signed)
Call placed to patient.   Advised that she should continue Mucinex and give ABTx time to work.

## 2014-01-14 ENCOUNTER — Ambulatory Visit (INDEPENDENT_AMBULATORY_CARE_PROVIDER_SITE_OTHER): Payer: Medicare Other | Admitting: Internal Medicine

## 2014-01-14 ENCOUNTER — Ambulatory Visit (HOSPITAL_COMMUNITY): Payer: Self-pay | Admitting: Psychiatry

## 2014-01-14 ENCOUNTER — Encounter (INDEPENDENT_AMBULATORY_CARE_PROVIDER_SITE_OTHER): Payer: Self-pay | Admitting: Internal Medicine

## 2014-01-14 VITALS — BP 80/54 | HR 84 | Temp 97.4°F | Ht 67.0 in | Wt 202.3 lb

## 2014-01-14 DIAGNOSIS — K5909 Other constipation: Secondary | ICD-10-CM

## 2014-01-14 DIAGNOSIS — R159 Full incontinence of feces: Secondary | ICD-10-CM

## 2014-01-14 NOTE — Patient Instructions (Addendum)
Fleets enema today. Call me this afternoon or in the morning and tell me how you did.  Samples of Linzess x 4 boxes 253mcg given to patient.

## 2014-01-14 NOTE — Progress Notes (Signed)
Subjective:    Patient ID: Tonya Sullivan, female    DOB: July 17, 1955, 59 y.o.   MRN: [id number]  HPI Here today with c/o fecal incontinence. She is wearing pads.  She was told by Dr Buelah Manis she was constipated . Placed on Movantik for opoid induced constipation .  Abdominal xray on 01/08/2014 revealed larged amount of stool in the colon. She continues to have incontinence. She says stool is leaking out.  She says her last BM was yesterday.She says the BM was "about a bowl full". Appetite is good. No weight loss. GERD controlled.   No abdominal pain. No melena or BRRB.  Last seen in September for f/u of her GERD.   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  She has finished radiation/chemo. It was actually stopped. Patient could not tolerate.     Review of Systems Past Medical History  Diagnosis Date  . COPD (chronic obstructive pulmonary disease)   . Depression   . Chronic back pain     DDD, disc bulge, radiculopathy, spinal stenosis  . Hyperlipidemia   . Anxiety   . Bipolar disorder, unspecified   . Carotid artery calcification   . DDD (degenerative disc disease), lumbosacral   . Spinal stenosis, lumbar   . Autoimmune thyroiditis   . Essential hypertension, benign   . TIA (transient ischemic attack)     August 2014  . GERD (gastroesophageal reflux disease)   . Neuropathic pain   . Addison's disease     On Prednisone  . History of pneumonia   . Achalasia   . Frequent falls   . Hypothyroidism   . Melanoma   . 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 DM (diabetes mellitus) with complications     Past Surgical History  Procedure Laterality Date  . Appendectomy    . Cholecystectomy    . Lump left breast      Benign  . Abdominal hysterectomy      Tubal pregnancy  . Inguinal hernia repair    . Stomach surgery    . Ectopic pregnancy surgery    . Back surgery      x 5;1984;1989;;1999;2000;2010  . Colonoscopy  01/25/2012    Procedure: COLONOSCOPY;   Surgeon: Rogene Houston, MD;  Location: [facility];  Service: Endoscopy;  Laterality: N/A;  130  . Esophagogastroduodenoscopy (egd) with esophageal dilation N/A 08/13/2012    Procedure: ESOPHAGOGASTRODUODENOSCOPY (EGD) WITH ESOPHAGEAL DILATION;  Surgeon: Rogene Houston, MD;  Location: [facility];  Service: Endoscopy;  Laterality: N/A;  315  . Anterior cervical decomp/discectomy fusion N/A 10/21/2012    Procedure: Cervical Six-Seven Anterior cervical decompression/diskectomy/fusion;  Surgeon: Kristeen Miss, MD;  Location: [facility];  Service: Neurosurgery;  Laterality: N/A;  Cervical Six-Seven Anterior cervical decompression/diskectomy/fusion  . Cataract extraction w/phaco Right 11/25/2012    Procedure: CATARACT EXTRACTION PHACO AND INTRAOCULAR LENS PLACEMENT (IOC);  Surgeon: Elta Guadeloupe T. Gershon Crane, MD;  Location: [facility];  Service: Ophthalmology;  Laterality: Right;  CDE:10.06  . Cataract extraction w/phaco Left 12/09/2012    Procedure: CATARACT EXTRACTION PHACO AND INTRAOCULAR LENS PLACEMENT (IOC);  Surgeon: Elta Guadeloupe T. Gershon Crane, MD;  Location: [facility];  Service: Ophthalmology;  Laterality: Left;  CDE:5.06  . Tonsillectomy    . Hiatal hernia repair    . Heller myotomy N/A 03/17/2013    Procedure: DIAGNOSTIC LAPAROSCOPY, LAPAROSCOPIC HELLER MYOTOMY, ENDOSCOPY, DOR FUNDOPLICATION;  Surgeon: Ralene Ok, MD;  Location: [facility];  Service: General;  Laterality: N/A;  .  Breast surgery      Left breast  . Eye surgery    . Video bronchoscopy with endobronchial ultrasound N/A 07/24/2013    Procedure: VIDEO BRONCHOSCOPY WITH ENDOBRONCHIAL ULTRASOUND;  Surgeon: [name], MD;  Location: [facility];  Service: Thoracic;  Laterality: N/A;    Allergies  Allergen Reactions  . Codeine Itching  . Cyclobenzaprine Hives  . Darvocet [Propoxyphene N-Acetaminophen] Rash  . Abilify [Aripiprazole] Other (See Comments)    tremors  . Ace Inhibitors Other (See Comments)    unknown  . Adhesive [Tape] Hives  .  Gabapentin Swelling  . Iodine Hives    Topical iodine, not allergic to  IV contrast  . Levofloxacin Hives and Hypertension  . Prednisone Hives, Itching and Hypertension    Can't take more than 7.5mg    . Sulfa Antibiotics Itching  . Robaxin [Methocarbamol] Rash    Current Outpatient Prescriptions on File Prior to Visit  Medication Sig Dispense Refill  . albuterol (PROVENTIL HFA;VENTOLIN HFA) 108 (90 BASE) MCG/ACT inhaler Inhale 2 puffs into the lungs 3 (three) times daily as needed for wheezing or shortness of breath. 1 Inhaler 0  . albuterol (PROVENTIL) (2.5 MG/3ML) 0.083% nebulizer solution Take 3 mLs (2.5 mg total) by nebulization every 4 (four) hours as needed for wheezing or shortness of breath. 75 mL 1  . amitriptyline (ELAVIL) 100 MG tablet Take one and one half tablets at bedtime 45 tablet 2  . clonazePAM (KLONOPIN) 1 MG tablet Take 1 tablet (1 mg total) by mouth 4 (four) times daily. 120 tablet 2  . dexlansoprazole (DEXILANT) 60 MG capsule Take 1 capsule (60 mg total) by mouth daily. 30 capsule 3  . diphenhydrAMINE-zinc acetate (BENADRYL) cream Apply topically 3 (three) times daily as needed for itching. Apply to affected area 28.4 g 0  . divalproex (DEPAKOTE ER) 500 MG 24 hr tablet Take 1 tablet (500 mg total) by mouth daily. 30 tablet 2  . donepezil (ARICEPT) 5 MG tablet Take 1 tablet (5 mg total) by mouth at bedtime. 30 tablet 3  . DULoxetine (CYMBALTA) 60 MG capsule Take 1 capsule (60 mg total) by mouth every morning. 30 capsule 2  . EPINEPHrine (EPIPEN) 0.3 mg/0.3 mL IJ SOAJ injection Inject 0.3 mLs (0.3 mg total) into the muscle once. PRN FOR SEVERE ALLERGIC REACTION 1 Device 6  . feeding supplement, ENSURE COMPLETE, (ENSURE COMPLETE) LIQD Take 237 mLs by mouth daily. 10 Bottle 0  . fentaNYL (DURAGESIC) 75 MCG/HR Place 1 patch (75 mcg total) onto the skin every 3 (three) days. 10 patch 0  . levothyroxine (SYNTHROID, LEVOTHROID) 25 MCG tablet Take 25 mcg by mouth at bedtime.       . lidocaine-prilocaine (EMLA) cream Apply 1 application topically as needed. Apply to port 1 hr before chemo (Patient taking differently: Apply 1 application topically as needed (Apply to port 1 hr before chemo). ) 30 g 0  . loratadine (CLARITIN) 10 MG tablet Take 1 tablet (10 mg total) by mouth daily. 30 tablet 0  . metoprolol tartrate (LOPRESSOR) 25 MG tablet Take take 1/2 of a tablet twice a day. 30 tablet 3  . Naloxegol Oxalate (MOVANTIK) 25 MG TABS Take 1 tablet by mouth daily. 30 tablet   . ondansetron (ZOFRAN) 4 MG tablet Take 1 tablet (4 mg total) by mouth every 6 (six) hours as needed for refractory nausea / vomiting. 20 tablet 0  . oxyCODONE-acetaminophen (PERCOCET) 7.5-325 MG per tablet Take 1 tablet by mouth every 6 (six)  hours as needed for pain. 60 tablet 0  . pantoprazole (PROTONIX) 40 MG tablet TAKE (1) TABLET BY MOUTH DAILY. 30 tablet 6  . pravastatin (PRAVACHOL) 40 MG tablet Take 40 mg by mouth daily.    . predniSONE (DELTASONE) 2.5 MG tablet Take 3 tablets (7.5 mg total) by mouth daily with breakfast.    . Tiotropium Bromide Monohydrate (SPIRIVA RESPIMAT) 2.5 MCG/ACT AERS Inhale 2 puffs into the lungs daily.    Marland Kitchen lidocaine (LIDODERM) 5 % APPLY ONE PATCH TO SKIN EVERY 12 HOURS. 12 HOURS OFF. 30 patch 6  . [DISCONTINUED] Gabapentin, PHN, (GRALISE STARTER) 300 & 600 MG MISC Take 300-1,800 mg by mouth as directed. 15 DAY Starter pack doses     DAY 1- 300 mg     DAY 2- 600 mg     DAY 3 to 6 - 900 mg    DAY 7 to 10 - 1200 mg     DAY 11 to 14 - 1500 mg      DAY 15 - 1800 mg      No current facility-administered medications on file prior to visit.           Objective:   Physical Exam  Filed Vitals:   01/14/14 1045  Height: 5\' 7"  (1.702 m)  Weight: 202 lb 4.8 oz (91.763 kg)   Alert and oriented. Skin warm and dry. Oral mucosa is moist.   . Sclera anicteric, conjunctivae is pink. Thyroid not enlarged. No cervical lymphadenopathy. Lungs clear. Heart regular rate and rhythm.   Abdomen is soft. Bowel sounds are positive. No hepatomegaly. No abdominal masses felt. No tenderness.  No edema to lower extremities.   Rectal exam: formed stool noted in panties. Stool pasty.  No impaction felt. Guaiac negative.        Assessment & Plan:  Constipation. Has been taking pain medication every 6 hrs as needed for hip pain. Samples of Linzess given to patien, 2 36mcg x 5 boxes. Try taking an enema today. Call me in the morning with results.

## 2014-01-18 ENCOUNTER — Other Ambulatory Visit: Payer: Self-pay | Admitting: Family Medicine

## 2014-01-18 NOTE — Telephone Encounter (Signed)
Refill appropriate and filled per protocol. 

## 2014-01-20 ENCOUNTER — Telehealth: Payer: Self-pay | Admitting: Family Medicine

## 2014-01-20 MED ORDER — OXYCODONE-ACETAMINOPHEN 7.5-325 MG PO TABS: 1.0000 | ORAL_TABLET | Freq: Four times a day (QID) | ORAL | Status: DC | PRN

## 2014-01-20 NOTE — Telephone Encounter (Signed)
Rx printed and pt aware ready for pickup

## 2014-01-20 NOTE — Telephone Encounter (Signed)
Approved for # 60 + 0

## 2014-01-20 NOTE — Telephone Encounter (Signed)
Ok to refill??  Last office visit 01/08/2014.  Last refill 12/22/2013.

## 2014-01-20 NOTE — Telephone Encounter (Signed)
Patient is calling to get rx for her percocet  [phone]

## 2014-01-21 ENCOUNTER — Encounter: Payer: Self-pay | Admitting: Radiation Oncology

## 2014-01-21 ENCOUNTER — Ambulatory Visit
Admission: RE | Admit: 2014-01-21 | Discharge: 2014-01-21 | Disposition: A | Discharge status: Home / Self Care | Payer: Medicare Other | Source: Ambulatory Visit | Attending: Radiation Oncology | Admitting: Radiation Oncology

## 2014-01-21 VITALS — BP 85/52 | HR 86 | Temp 98.4°F | Resp 20 | Wt 206.6 lb

## 2014-01-21 DIAGNOSIS — C3491 Malignant neoplasm of unspecified part of right bronchus or lung: Secondary | ICD-10-CM

## 2014-01-21 DIAGNOSIS — C3490 Malignant neoplasm of unspecified part of unspecified bronchus or lung: Secondary | ICD-10-CM

## 2014-01-21 DIAGNOSIS — R918 Other nonspecific abnormal finding of lung field: Secondary | ICD-10-CM

## 2014-01-21 NOTE — Progress Notes (Signed)
   Department of Radiation Oncology  Phone:  [phone] Fax:        [phone]   Name: Tonya Sullivan MRN: [id number]  DOB: 11-Mar-1955  Date: 01/21/2014  Follow Up Visit Note  Diagnosis: Small cell carcinoma of lung   Primary site: Lung (Right)   Staging method: AJCC 7th Edition   Clinical: Stage IIA (T1a, N1, M0) signed by Curt Bears, MD on 08/06/2013  2:02 PM   Summary: Stage IIA (T1a, N1, M0)  Summary and Interval since last radiation: 3 months from completion of 40 (out of a planned 60) Gy, treatment course complicated by compliance and low blood counts.   Interval History: Tonya Sullivan presents today for routine followup.  She is feeling better and has some more energy. She still struggles with pain issues and her Addison's disease. She had staging scans in November which showed a decrease in the size of the right upper lobe and hilum. She had a CT of the head which was negative for metastatic disease in November. She is accompanied by her daughterin law, son and 2 granddaughters.  She spends the majority of her time in bed and uses a wheelchair here in the cancer center.   Physical Exam:  Filed Vitals:   01/21/14 1346 01/21/14 1347  BP: 94/55 85/52  Pulse: 81 86  Temp: 98.4 F (36.9 C)   TempSrc: Oral   Resp: 20   Weight: 206 lb 9.6 oz (93.713 kg)   SpO2: 94% 92%  Alert and oriented. Appears stronger than I have seen her in the past.   Tonya Sullivan is a 59 y.o. female s/p chemoradiation for small cell lung cancer here to discuss PCI  PLAN:  We discussed prophylactic cranial radiation due to the brain as a sanctuary site. We discussed the results of randomized trials showing an improvement in survival in patients who undergo PCI. We discussed the process of simulation and need for a mask. We discussed 12 treatments as an outpatient. We discussed long term side effects of treatment including fatigue and possible short term memory loss. We discussed that each of these  treatments can be irreversible. We discussed the possibilities if she didn't have treatment now and waited until she developed symptomatic metastases. We discussed headaches, seizures, inability to move limbs, and loss of vision or speech.  At this point she is not sure that she could tolerate being more fatigued than she already is. She is worried that she will end up in the hospital again.  We discussed that could be a possibility.  She would like to discuss this decision with her family. She will call me if she would like to set up simulation and will keep her appointment with Dr. Julien Nordmann in March for her scans if she decides not to take PCI.  I encouraged her to follow up with her PCP in regards to her pain.     Thea Silversmith, MD

## 2014-01-26 ENCOUNTER — Telehealth: Payer: Self-pay | Admitting: Family Medicine

## 2014-01-26 ENCOUNTER — Ambulatory Visit (HOSPITAL_COMMUNITY): Payer: Self-pay | Admitting: Psychiatry

## 2014-01-26 ENCOUNTER — Telehealth (INDEPENDENT_AMBULATORY_CARE_PROVIDER_SITE_OTHER): Payer: Self-pay | Admitting: *Deleted

## 2014-01-26 NOTE — Telephone Encounter (Signed)
Elif called and advised we have samples for her to pick up. Voices understood.

## 2014-01-26 NOTE — Telephone Encounter (Signed)
Pt has brain cancer.  Has been told only option for her now is radiation therapy.  She has decided she does not want to do this.  She would like Hospice referral to help her with care.

## 2014-01-26 NOTE — Telephone Encounter (Signed)
Spoke with pt, she does not have active brain cancer, they were concerned about possible mets from her lung cancer and wanted PCI. She declines any further intervention. I will discuss with her oncologist, I dont see any reason to put her in hospice at this time unless they have further concerns.  Pt voiced understanding

## 2014-01-26 NOTE — Telephone Encounter (Signed)
Samples of Linzess 160mcg

## 2014-01-26 NOTE — Telephone Encounter (Signed)
Lenita called and said she is out of her samples of Linzess and the problem has started back up. She is needing more samples please. The return phone number is [phone].

## 2014-01-27 ENCOUNTER — Telehealth: Payer: Self-pay | Admitting: Medical Oncology

## 2014-01-27 DIAGNOSIS — K5901 Slow transit constipation: Secondary | ICD-10-CM

## 2014-01-27 MED ORDER — LINACLOTIDE 290 MCG PO CAPS: 290.0000 ug | ORAL_CAPSULE | Freq: Every day | ORAL | Status: AC

## 2014-01-27 NOTE — Telephone Encounter (Signed)
i left message for mary beth to return my call and will make her aware of pt PCP note.

## 2014-01-27 NOTE — Telephone Encounter (Signed)
Rx for Linzess sent to her pharmacy.  

## 2014-01-27 NOTE — Telephone Encounter (Signed)
I spoke to patient as well as her oncologist this morning Dr. Earlie Server. She does not have any active cancer I tried to reiterate that she does not need hospice and he also agrees with this. She she declines the PCI intervention for prophylaxis to the brain as well as any further chemotherapy at this time anyway. She understands that she still will be under surveillance with CT scans and blood work. I did note that she called her gastroenterologist asking to be referred to hospice as well and told her that this was not needed and that they would not refer her to hospice.  She has follow-up with her endocrinologist regarding her adrenal glands within the next 2 weeks.  She was recently seen by her psychiatrist about 2 weeks ago and her medication was adjusted at that time hopefully this will help with her mental illness

## 2014-01-28 ENCOUNTER — Telehealth: Payer: Self-pay | Admitting: *Deleted

## 2014-01-28 ENCOUNTER — Telehealth: Payer: Self-pay | Admitting: Family Medicine

## 2014-01-28 ENCOUNTER — Telehealth: Payer: Self-pay | Admitting: Medical Oncology

## 2014-01-28 NOTE — Telephone Encounter (Signed)
Patient says that the linzess that dr Buelah Manis prescribed is causing her to have several accidents would like a call back regarding this [phone]

## 2014-01-28 NOTE — Telephone Encounter (Signed)
Advised by front office staff that patient has contacted office multiple times since initial phone call. Reports that patient noted to have concerns about variety of issues.   Call placed to patient to inquire. Patient noted to be rambling and had difficulty completing whole sentences. With direction from writer, patient was able to gather thoughts and listed 4 main areas of concern.  1) Sore Throat: Patient states that she woke up this morning with a sore throat. Denies fever/ chills, general malaise, ear pain, sinus pressure or nasal drainage. Does state that she has non-productive cough, but this is patient baseline. Reports that her throat appears pink in color to her. States that she is using inhalers and nebulizer for cough, SOB. Also reports that she is using Chloraseptic for throat pain. Advised to add salt water gargles to regimen BID.   2) Hypoxia: Patient reports that she believes that her O2 levels are dropping while she moves around. States that she thinks that her levels are dropping because she suddenly becomes weak. States that she does have O2 Sat Meter, but she has not checked her levels when she thinks they are down. Advised to monitor levels with meter and to contact office with readings.   3) Pain: Patient states that she is in constant pain. Reports that Duragesic Patch and PO Percocet are effective for short periods of time. States that when she takes Percocet PRN, her pain is about 80% improved. Reports that this improvement only lasts for about an hour, and she must lie down for that amount of time. States that if she pushes herself after taking Percocet, the effects wear off much sooner. Of note, patient also reports that when she takes Percocet, she becomes dizzy and has the sensation that she is going to fall.   4) Hospice Referral: Patient reports that she would like a Hospice referral since she hs decided to stop all oncology treatments for her lung and brain cancer. Advised  again that patient does not have brain cancer. Oncology wanted to radiate brain to prevent mets, but patient has decided not to have treatment. Spoke with patient for extended period of time reiterating that oncologist and PCP do not feel that patient is appropriate for Hospice services. Advised that Hospice services are generally administered to terminal patients where death is imminent. Patient verbalized understanding, but voiced concerns that she needs help with her day to day care. Reports that she has no help at this time and cannot care for self. Of note, patient has been escorted by family to last visits to [facility], oncology and GI per MD notes.   PCP to be made aware of patient concerns.

## 2014-01-28 NOTE — Telephone Encounter (Signed)
Call placed to patient.   Advised to contact GI for further recommendations.

## 2014-01-28 NOTE — Telephone Encounter (Signed)
Patient called reporting she "does not want to receive anymore chemotherapy or Radiation treatments.  Dr. Pablo Ledger asked me if I want Hospice and I do.  I am confused now about what I need to do next."  This nurse will notify Dr. Julien Nordmann about patient's request.   Patient can be reached at [phone].  She did share with this nurse that she lives alone.  Reports family come to check on her.  Her son lives in [place], his wife is a her nursing aid.  Her Mother in [place] and a brother in [place].  Does not have life alert service turned on.  This nurse advised her to call 911 for any medical emergencies.

## 2014-01-28 NOTE — Telephone Encounter (Signed)
Received phone call from patient son.   Reports that he received call from patient stating that office staff is trying to force patient into treatment for brain cancer. Reports that he was with patient at oncology appointment. Reports that family agreed that treatment is not necessary.   Advised patient son that patient has the right to refuse treatment, but no dx of brain cancer has ever been given. Explained that patient may have misjudged office attempt to explain that she does not have cancer in her brain; the treatment would have been prophylactic if patient choose to continue.

## 2014-01-28 NOTE — Telephone Encounter (Signed)
I told pt that Dr Julien Nordmann does not think she is hospice appropriate at this time. Pt stated she feels tired all the time and does not feel good. She also stated she declined prophylactic brain irradiation. I instructed pt to call her PCP to follow up her symptoms and to keep appt with [facility] in March.

## 2014-01-29 NOTE — Telephone Encounter (Signed)
I have already spoke with pt regarding this NOTE FROM 1/27, the family can schedule a meeting- she does not have brain cancer. Schedule a 30 minute visit for them  We will discuss putting her back in a pain clinic as well, Bring all of her medications with her

## 2014-01-29 NOTE — Telephone Encounter (Signed)
Call placed to patient. LMTRC.  

## 2014-01-30 ENCOUNTER — Inpatient Hospital Stay (HOSPITAL_COMMUNITY)
Admission: EM | Admit: 2014-01-30 | Discharge: 2014-02-01 | DRG: 071 | Disposition: A | Discharge status: Hospice | Payer: Medicare Other | Attending: Family Medicine | Admitting: Family Medicine

## 2014-01-30 ENCOUNTER — Encounter (HOSPITAL_COMMUNITY): Payer: Self-pay | Admitting: *Deleted

## 2014-01-30 ENCOUNTER — Emergency Department (HOSPITAL_COMMUNITY): Payer: Medicare Other

## 2014-01-30 DIAGNOSIS — G4700 Insomnia, unspecified: Secondary | ICD-10-CM | POA: Diagnosis present

## 2014-01-30 DIAGNOSIS — E785 Hyperlipidemia, unspecified: Secondary | ICD-10-CM | POA: Diagnosis present

## 2014-01-30 DIAGNOSIS — F319 Bipolar disorder, unspecified: Secondary | ICD-10-CM | POA: Diagnosis present

## 2014-01-30 DIAGNOSIS — R4182 Altered mental status, unspecified: Secondary | ICD-10-CM | POA: Diagnosis not present

## 2014-01-30 DIAGNOSIS — E780 Pure hypercholesterolemia: Secondary | ICD-10-CM | POA: Diagnosis present

## 2014-01-30 DIAGNOSIS — F17210 Nicotine dependence, cigarettes, uncomplicated: Secondary | ICD-10-CM | POA: Diagnosis present

## 2014-01-30 DIAGNOSIS — E2740 Unspecified adrenocortical insufficiency: Secondary | ICD-10-CM | POA: Diagnosis present

## 2014-01-30 DIAGNOSIS — Z515 Encounter for palliative care: Secondary | ICD-10-CM

## 2014-01-30 DIAGNOSIS — Z9221 Personal history of antineoplastic chemotherapy: Secondary | ICD-10-CM

## 2014-01-30 DIAGNOSIS — K219 Gastro-esophageal reflux disease without esophagitis: Secondary | ICD-10-CM | POA: Diagnosis present

## 2014-01-30 DIAGNOSIS — F419 Anxiety disorder, unspecified: Secondary | ICD-10-CM | POA: Diagnosis present

## 2014-01-30 DIAGNOSIS — Z8673 Personal history of transient ischemic attack (TIA), and cerebral infarction without residual deficits: Secondary | ICD-10-CM

## 2014-01-30 DIAGNOSIS — C3490 Malignant neoplasm of unspecified part of unspecified bronchus or lung: Secondary | ICD-10-CM | POA: Diagnosis present

## 2014-01-30 DIAGNOSIS — E441 Mild protein-calorie malnutrition: Secondary | ICD-10-CM | POA: Diagnosis present

## 2014-01-30 DIAGNOSIS — Z888 Allergy status to other drugs, medicaments and biological substances status: Secondary | ICD-10-CM

## 2014-01-30 DIAGNOSIS — F1120 Opioid dependence, uncomplicated: Secondary | ICD-10-CM | POA: Diagnosis present

## 2014-01-30 DIAGNOSIS — K5900 Constipation, unspecified: Secondary | ICD-10-CM | POA: Diagnosis present

## 2014-01-30 DIAGNOSIS — J209 Acute bronchitis, unspecified: Secondary | ICD-10-CM | POA: Diagnosis present

## 2014-01-30 DIAGNOSIS — E271 Primary adrenocortical insufficiency: Secondary | ICD-10-CM | POA: Diagnosis present

## 2014-01-30 DIAGNOSIS — E063 Autoimmune thyroiditis: Secondary | ICD-10-CM | POA: Diagnosis present

## 2014-01-30 DIAGNOSIS — J440 Chronic obstructive pulmonary disease with acute lower respiratory infection: Secondary | ICD-10-CM | POA: Diagnosis present

## 2014-01-30 DIAGNOSIS — Z9119 Patient's noncompliance with other medical treatment and regimen: Secondary | ICD-10-CM | POA: Diagnosis present

## 2014-01-30 DIAGNOSIS — Z85820 Personal history of malignant melanoma of skin: Secondary | ICD-10-CM

## 2014-01-30 DIAGNOSIS — Z9049 Acquired absence of other specified parts of digestive tract: Secondary | ICD-10-CM | POA: Diagnosis present

## 2014-01-30 DIAGNOSIS — E119 Type 2 diabetes mellitus without complications: Secondary | ICD-10-CM | POA: Diagnosis present

## 2014-01-30 DIAGNOSIS — Z881 Allergy status to other antibiotic agents status: Secondary | ICD-10-CM | POA: Diagnosis not present

## 2014-01-30 DIAGNOSIS — I10 Essential (primary) hypertension: Secondary | ICD-10-CM | POA: Diagnosis not present

## 2014-01-30 DIAGNOSIS — G9340 Encephalopathy, unspecified: Secondary | ICD-10-CM | POA: Diagnosis not present

## 2014-01-30 DIAGNOSIS — E871 Hypo-osmolality and hyponatremia: Secondary | ICD-10-CM | POA: Insufficient documentation

## 2014-01-30 DIAGNOSIS — Z885 Allergy status to narcotic agent status: Secondary | ICD-10-CM

## 2014-01-30 DIAGNOSIS — Z7952 Long term (current) use of systemic steroids: Secondary | ICD-10-CM

## 2014-01-30 DIAGNOSIS — Z79899 Other long term (current) drug therapy: Secondary | ICD-10-CM | POA: Diagnosis not present

## 2014-01-30 DIAGNOSIS — Z8701 Personal history of pneumonia (recurrent): Secondary | ICD-10-CM

## 2014-01-30 DIAGNOSIS — Z801 Family history of malignant neoplasm of trachea, bronchus and lung: Secondary | ICD-10-CM

## 2014-01-30 DIAGNOSIS — E039 Hypothyroidism, unspecified: Secondary | ICD-10-CM | POA: Diagnosis present

## 2014-01-30 DIAGNOSIS — Z8249 Family history of ischemic heart disease and other diseases of the circulatory system: Secondary | ICD-10-CM

## 2014-01-30 DIAGNOSIS — Z818 Family history of other mental and behavioral disorders: Secondary | ICD-10-CM

## 2014-01-30 LAB — CBC WITH DIFFERENTIAL/PLATELET
BASOS ABS: 0 10*3/uL (ref 0.0–0.1)
BASOS PCT: 0 % (ref 0–1)
BASOS PCT: 0 % (ref 0–1)
Basophils Absolute: 0 10*3/uL (ref 0.0–0.1)
EOS ABS: 0.1 10*3/uL (ref 0.0–0.7)
EOS ABS: 0.1 10*3/uL (ref 0.0–0.7)
Eosinophils Relative: 1 % (ref 0–5)
Eosinophils Relative: 1 % (ref 0–5)
HCT: 37.8 % (ref 36.0–46.0)
HEMATOCRIT: 39.5 % (ref 36.0–46.0)
Hemoglobin: 12.7 g/dL (ref 12.0–15.0)
Hemoglobin: 12.3 g/dL (ref 12.0–15.0)
LYMPHS PCT: 26 % (ref 12–46)
Lymphocytes Relative: 17 % (ref 12–46)
Lymphs Abs: 1.2 10*3/uL (ref 0.7–4.0)
Lymphs Abs: 1.4 10*3/uL (ref 0.7–4.0)
MCH: 30 pg (ref 26.0–34.0)
MCH: 30.4 pg (ref 26.0–34.0)
MCHC: 32.2 g/dL (ref 30.0–36.0)
MCHC: 32.5 g/dL (ref 30.0–36.0)
MCV: 93.4 fL (ref 78.0–100.0)
MCV: 93.3 fL (ref 78.0–100.0)
Monocytes Absolute: 0.7 10*3/uL (ref 0.1–1.0)
Monocytes Absolute: 0.3 10*3/uL (ref 0.1–1.0)
Monocytes Relative: 10 % (ref 3–12)
Monocytes Relative: 6 % (ref 3–12)
Neutro Abs: 5.2 10*3/uL (ref 1.7–7.7)
Neutro Abs: 3.5 10*3/uL (ref 1.7–7.7)
Neutrophils Relative %: 72 % (ref 43–77)
Neutrophils Relative %: 67 % (ref 43–77)
Platelets: 214 10*3/uL (ref 150–400)
Platelets: 210 10*3/uL (ref 150–400)
RBC: 4.23 MIL/uL (ref 3.87–5.11)
RBC: 4.05 MIL/uL (ref 3.87–5.11)
RDW: 15.1 % (ref 11.5–15.5)
RDW: 15 % (ref 11.5–15.5)
WBC: 7.2 10*3/uL (ref 4.0–10.5)
WBC: 5.3 10*3/uL (ref 4.0–10.5)

## 2014-01-30 LAB — COMPREHENSIVE METABOLIC PANEL
ALBUMIN: 3.5 g/dL (ref 3.5–5.2)
ALK PHOS: 57 U/L (ref 39–117)
ALT: 13 U/L (ref 0–35)
ALT: 13 U/L (ref 0–35)
ANION GAP: 9 (ref 5–15)
ANION GAP: 9 (ref 5–15)
AST: 25 U/L (ref 0–37)
AST: 28 U/L (ref 0–37)
Albumin: 3.3 g/dL — ABNORMAL LOW (ref 3.5–5.2)
Alkaline Phosphatase: 60 U/L (ref 39–117)
BILIRUBIN TOTAL: 0.5 mg/dL (ref 0.3–1.2)
BUN: 11 mg/dL (ref 6–23)
BUN: 10 mg/dL (ref 6–23)
CALCIUM: 8.7 mg/dL (ref 8.4–10.5)
CHLORIDE: 94 mmol/L — AB (ref 96–112)
CO2: 30 mmol/L (ref 19–32)
CO2: 31 mmol/L (ref 19–32)
CREATININE: 1.04 mg/dL (ref 0.50–1.10)
CREATININE: 0.94 mg/dL (ref 0.50–1.10)
Calcium: 8.7 mg/dL (ref 8.4–10.5)
Chloride: 91 mmol/L — ABNORMAL LOW (ref 96–112)
GFR calc Af Amer: 76 mL/min — ABNORMAL LOW (ref >90)
GFR calc non Af Amer: 66 mL/min — ABNORMAL LOW (ref >90)
GFR, EST AFRICAN AMERICAN: 67 mL/min — AB (ref >90)
GFR, EST NON AFRICAN AMERICAN: 58 mL/min — AB (ref >90)
GLUCOSE: 99 mg/dL (ref 70–99)
GLUCOSE: 143 mg/dL — AB (ref 70–99)
POTASSIUM: 4.3 mmol/L (ref 3.5–5.1)
Potassium: 4.1 mmol/L (ref 3.5–5.1)
Sodium: 130 mmol/L — ABNORMAL LOW (ref 135–145)
Sodium: 134 mmol/L — ABNORMAL LOW (ref 135–145)
TOTAL PROTEIN: 6.8 g/dL (ref 6.0–8.3)
Total Bilirubin: 0.4 mg/dL (ref 0.3–1.2)
Total Protein: 6.7 g/dL (ref 6.0–8.3)

## 2014-01-30 LAB — URINALYSIS, ROUTINE W REFLEX MICROSCOPIC
Bilirubin Urine: NEGATIVE
GLUCOSE, UA: NEGATIVE mg/dL
Hgb urine dipstick: NEGATIVE
KETONES UR: NEGATIVE mg/dL
Nitrite: NEGATIVE
PH: 6 (ref 5.0–8.0)
Protein, ur: NEGATIVE mg/dL
SPECIFIC GRAVITY, URINE: 1.021 (ref 1.005–1.030)
Urobilinogen, UA: 0.2 mg/dL (ref 0.0–1.0)

## 2014-01-30 LAB — RAPID URINE DRUG SCREEN, HOSP PERFORMED
Amphetamines: NOT DETECTED
Barbiturates: NOT DETECTED
Benzodiazepines: POSITIVE — AB
COCAINE: NOT DETECTED
Opiates: POSITIVE — AB
Tetrahydrocannabinol: NOT DETECTED

## 2014-01-30 LAB — URINE MICROSCOPIC-ADD ON

## 2014-01-30 LAB — APTT: aPTT: 27 seconds (ref 24–37)

## 2014-01-30 LAB — PROTIME-INR
INR: 0.91 (ref 0.00–1.49)
Prothrombin Time: 12.4 seconds (ref 11.6–15.2)

## 2014-01-30 LAB — PHOSPHORUS: Phosphorus: 3.9 mg/dL (ref 2.3–4.6)

## 2014-01-30 LAB — MAGNESIUM: Magnesium: 2.1 mg/dL (ref 1.5–2.5)

## 2014-01-30 LAB — TSH: TSH: 0.495 u[IU]/mL (ref 0.350–4.500)

## 2014-01-30 LAB — ETHANOL: Alcohol, Ethyl (B): <5 mg/dL (ref 0–9)

## 2014-01-30 MED ORDER — FENTANYL CITRATE 0.05 MG/ML IJ SOLN: 100.0000 ug | Freq: Once | INTRAMUSCULAR | Status: DC

## 2014-01-30 MED ORDER — TIOTROPIUM BROMIDE MONOHYDRATE 18 MCG IN CAPS
18.0000 ug | ORAL_CAPSULE | Freq: Every day | RESPIRATORY_TRACT | Status: DC
  Filled 2014-01-30: qty 5

## 2014-01-30 MED ORDER — ENSURE COMPLETE PO LIQD
237.0000 mL | Freq: Every day | ORAL | Status: DC
  Administered 2014-01-31 – 2014-02-01 (×2): 237 mL via ORAL

## 2014-01-30 MED ORDER — NALOXEGOL OXALATE 25 MG PO TABS: 25.0000 mg | ORAL_TABLET | Freq: Every morning | ORAL | Status: DC

## 2014-01-30 MED ORDER — PANTOPRAZOLE SODIUM 40 MG PO TBEC
40.0000 mg | DELAYED_RELEASE_TABLET | Freq: Every day | ORAL | Status: DC
  Administered 2014-01-31 – 2014-02-01 (×2): 40 mg via ORAL
  Filled 2014-01-30 (×2): qty 1

## 2014-01-30 MED ORDER — ONDANSETRON HCL 4 MG/2ML IJ SOLN: 4.0000 mg | Freq: Four times a day (QID) | INTRAMUSCULAR | Status: DC | PRN

## 2014-01-30 MED ORDER — DULOXETINE HCL 60 MG PO CPEP
60.0000 mg | ORAL_CAPSULE | Freq: Every morning | ORAL | Status: DC
  Administered 2014-01-31 – 2014-02-01 (×2): 60 mg via ORAL
  Filled 2014-01-30 (×2): qty 1

## 2014-01-30 MED ORDER — DIPHENHYDRAMINE-ZINC ACETATE 2-0.1 % EX CREA: TOPICAL_CREAM | Freq: Three times a day (TID) | CUTANEOUS | Status: DC | PRN

## 2014-01-30 MED ORDER — NALOXEGOL OXALATE 25 MG PO TABS: 1.0000 | ORAL_TABLET | Freq: Every day | ORAL | Status: DC

## 2014-01-30 MED ORDER — SODIUM CHLORIDE 0.9 % IV SOLN
INTRAVENOUS | Status: AC
  Administered 2014-01-30: 19:00:00 via INTRAVENOUS

## 2014-01-30 MED ORDER — OXYCODONE-ACETAMINOPHEN 5-325 MG PO TABS
1.0000 | ORAL_TABLET | Freq: Four times a day (QID) | ORAL | Status: DC | PRN
  Administered 2014-01-30 – 2014-01-31 (×2): 1 via ORAL
  Filled 2014-01-30 (×2): qty 1

## 2014-01-30 MED ORDER — LORATADINE 10 MG PO TABS
10.0000 mg | ORAL_TABLET | Freq: Every day | ORAL | Status: DC
  Administered 2014-01-31 – 2014-02-01 (×2): 10 mg via ORAL
  Filled 2014-01-30 (×2): qty 1

## 2014-01-30 MED ORDER — PRAVASTATIN SODIUM 40 MG PO TABS
40.0000 mg | ORAL_TABLET | Freq: Every day | ORAL | Status: DC
  Administered 2014-01-30 – 2014-01-31 (×2): 40 mg via ORAL
  Filled 2014-01-30 (×4): qty 1

## 2014-01-30 MED ORDER — NICOTINE 14 MG/24HR TD PT24
14.0000 mg | MEDICATED_PATCH | Freq: Every day | TRANSDERMAL | Status: DC
  Administered 2014-01-30 – 2014-02-01 (×3): 14 mg via TRANSDERMAL
  Filled 2014-01-30 (×3): qty 1

## 2014-01-30 MED ORDER — TIOTROPIUM BROMIDE MONOHYDRATE 2.5 MCG/ACT IN AERS: 2.0000 | INHALATION_SPRAY | Freq: Every day | RESPIRATORY_TRACT | Status: DC

## 2014-01-30 MED ORDER — SODIUM CHLORIDE 0.9 % IV SOLN: INTRAVENOUS | Status: DC

## 2014-01-30 MED ORDER — FENTANYL 75 MCG/HR TD PT72: 75.0000 ug | MEDICATED_PATCH | TRANSDERMAL | Status: DC

## 2014-01-30 MED ORDER — AMITRIPTYLINE HCL 75 MG PO TABS
150.0000 mg | ORAL_TABLET | Freq: Every day | ORAL | Status: DC
  Administered 2014-01-30 – 2014-01-31 (×2): 150 mg via ORAL
  Filled 2014-01-30 (×3): qty 2

## 2014-01-30 MED ORDER — LEVOTHYROXINE SODIUM 25 MCG PO TABS
25.0000 ug | ORAL_TABLET | Freq: Every day | ORAL | Status: DC
  Administered 2014-01-30 – 2014-01-31 (×2): 25 ug via ORAL
  Filled 2014-01-30 (×3): qty 1

## 2014-01-30 MED ORDER — ONDANSETRON HCL 4 MG PO TABS: 4.0000 mg | ORAL_TABLET | Freq: Four times a day (QID) | ORAL | Status: DC | PRN

## 2014-01-30 MED ORDER — PREDNISONE 2.5 MG PO TABS
7.5000 mg | ORAL_TABLET | Freq: Every day | ORAL | Status: DC
  Administered 2014-01-31 – 2014-02-01 (×2): 7.5 mg via ORAL
  Filled 2014-01-30 (×3): qty 1

## 2014-01-30 MED ORDER — ALBUTEROL SULFATE HFA 108 (90 BASE) MCG/ACT IN AERS: 2.0000 | INHALATION_SPRAY | Freq: Three times a day (TID) | RESPIRATORY_TRACT | Status: DC | PRN

## 2014-01-30 MED ORDER — LINACLOTIDE 290 MCG PO CAPS
290.0000 ug | ORAL_CAPSULE | Freq: Every day | ORAL | Status: DC
  Administered 2014-01-31: 290 ug via ORAL
  Filled 2014-01-30 (×2): qty 1

## 2014-01-30 MED ORDER — OXYCODONE HCL 5 MG PO TABS
5.0000 mg | ORAL_TABLET | Freq: Four times a day (QID) | ORAL | Status: DC | PRN
  Administered 2014-01-30 – 2014-01-31 (×2): 5 mg via ORAL
  Filled 2014-01-30 (×2): qty 1

## 2014-01-30 MED ORDER — DIVALPROEX SODIUM ER 500 MG PO TB24
500.0000 mg | ORAL_TABLET | Freq: Every day | ORAL | Status: DC
  Administered 2014-01-31 – 2014-02-01 (×2): 500 mg via ORAL
  Filled 2014-01-30 (×2): qty 1

## 2014-01-30 MED ORDER — ALBUTEROL SULFATE (2.5 MG/3ML) 0.083% IN NEBU: 2.5000 mg | INHALATION_SOLUTION | RESPIRATORY_TRACT | Status: DC | PRN

## 2014-01-30 MED ORDER — MIDAZOLAM HCL 10 MG/2ML IJ SOLN: 4.0000 mg | Freq: Once | INTRAMUSCULAR | Status: DC

## 2014-01-30 MED ORDER — CLONAZEPAM 1 MG PO TABS
1.0000 mg | ORAL_TABLET | Freq: Four times a day (QID) | ORAL | Status: DC
  Administered 2014-01-30 – 2014-02-01 (×7): 1 mg via ORAL
  Filled 2014-01-30 (×7): qty 1

## 2014-01-30 MED ORDER — METOPROLOL TARTRATE 12.5 MG HALF TABLET
12.5000 mg | ORAL_TABLET | Freq: Two times a day (BID) | ORAL | Status: DC
  Administered 2014-01-31 – 2014-02-01 (×3): 12.5 mg via ORAL
  Filled 2014-01-30 (×6): qty 1

## 2014-01-30 MED ORDER — OXYCODONE-ACETAMINOPHEN 7.5-325 MG PO TABS
1.0000 | ORAL_TABLET | Freq: Four times a day (QID) | ORAL | Status: DC | PRN
Start: 2014-01-30 — End: 2014-01-30

## 2014-01-30 MED ORDER — DONEPEZIL HCL 5 MG PO TABS
5.0000 mg | ORAL_TABLET | Freq: Every day | ORAL | Status: DC
  Administered 2014-01-30 – 2014-01-31 (×2): 5 mg via ORAL
  Filled 2014-01-30 (×3): qty 1

## 2014-01-30 NOTE — Discharge Instructions (Signed)
Drug screen showed opiates and benzodiazepines.  CT scan of head showed no acute findings. Follow-up your primary care doctor

## 2014-01-30 NOTE — ED Notes (Signed)
Report called to [place], RN

## 2014-01-30 NOTE — ED Notes (Addendum)
Pt called NP Kayleen Memos [place], NP [phone]) with [facility] while working on pt's d/c. NP spoke with this RN relaying concerns regarding pt's increased confusion and bowel incontinence over last week. Pt reported to her that her BM was black. NP requesting consideration of admission and PET scan. Lacinda Axon, MD made aware.

## 2014-01-30 NOTE — ED Notes (Signed)
Bed: UE28 Expected date: 01/30/14 Expected time: 10:47 AM Means of arrival: Ambulance Comments: Lung Ca, resolved confusion

## 2014-01-30 NOTE — ED Provider Notes (Signed)
CSN: [id number]     Arrival date & time 01/30/14  1048 History   First MD Initiated Contact with Patient 01/30/14 1102     Chief Complaint  Patient presents with  . Altered Mental Status     (Consider location/radiation/quality/duration/timing/severity/associated sxs/prior Treatment) HPI... Level V caveat for altered mental status. Home health nurse reports change in behavior including increased confusion and bowel incontinence over the past week. No other specific history. No frank chest pain, dyspnea, dysuria, fever, chills. Patient has history of right lung cancer.  Past Medical History  Diagnosis Date  . COPD (chronic obstructive pulmonary disease)   . Depression   . Chronic back pain     DDD, disc bulge, radiculopathy, spinal stenosis  . Hyperlipidemia   . Anxiety   . Bipolar disorder, unspecified   . Carotid artery calcification   . DDD (degenerative disc disease), lumbosacral   . Spinal stenosis, lumbar   . Autoimmune thyroiditis   . Essential hypertension, benign   . TIA (transient ischemic attack)     August 2014  . GERD (gastroesophageal reflux disease)   . Neuropathic pain   . Addison's disease     On Prednisone  . History of pneumonia   . Achalasia   . Frequent falls   . Hypothyroidism   . Melanoma   . 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 DM (diabetes mellitus) with complications    Past Surgical History  Procedure Laterality Date  . Appendectomy    . Cholecystectomy    . Lump left breast      Benign  . Abdominal hysterectomy      Tubal pregnancy  . Inguinal hernia repair    . Stomach surgery    . Ectopic pregnancy surgery    . Back surgery      x 5;1984;1989;;1999;2000;2010  . Colonoscopy  01/25/2012    Procedure: COLONOSCOPY;  Surgeon: Rogene Houston, MD;  Location: [facility];  Service: Endoscopy;  Laterality: N/A;  130  . Esophagogastroduodenoscopy (egd) with esophageal dilation N/A 08/13/2012    Procedure: ESOPHAGOGASTRODUODENOSCOPY (EGD)  WITH ESOPHAGEAL DILATION;  Surgeon: Rogene Houston, MD;  Location: [facility];  Service: Endoscopy;  Laterality: N/A;  315  . Anterior cervical decomp/discectomy fusion N/A 10/21/2012    Procedure: Cervical Six-Seven Anterior cervical decompression/diskectomy/fusion;  Surgeon: Kristeen Miss, MD;  Location: [facility];  Service: Neurosurgery;  Laterality: N/A;  Cervical Six-Seven Anterior cervical decompression/diskectomy/fusion  . Cataract extraction w/phaco Right 11/25/2012    Procedure: CATARACT EXTRACTION PHACO AND INTRAOCULAR LENS PLACEMENT (IOC);  Surgeon: Elta Guadeloupe T. Gershon Crane, MD;  Location: [facility];  Service: Ophthalmology;  Laterality: Right;  CDE:10.06  . Cataract extraction w/phaco Left 12/09/2012    Procedure: CATARACT EXTRACTION PHACO AND INTRAOCULAR LENS PLACEMENT (IOC);  Surgeon: Elta Guadeloupe T. Gershon Crane, MD;  Location: [facility];  Service: Ophthalmology;  Laterality: Left;  CDE:5.06  . Tonsillectomy    . Hiatal hernia repair    . Heller myotomy N/A 03/17/2013    Procedure: DIAGNOSTIC LAPAROSCOPY, LAPAROSCOPIC HELLER MYOTOMY, ENDOSCOPY, DOR FUNDOPLICATION;  Surgeon: Ralene Ok, MD;  Location: [facility];  Service: General;  Laterality: N/A;  . Breast surgery      Left breast  . Eye surgery    . Video bronchoscopy with endobronchial ultrasound N/A 07/24/2013    Procedure: VIDEO BRONCHOSCOPY WITH ENDOBRONCHIAL ULTRASOUND;  Surgeon: [name], MD;  Location: [facility];  Service: Thoracic;  Laterality: N/A;   Family History  Problem Relation Age of  Onset  . Hypertension Mother   . Heart disease Mother   . Heart attack Mother   . Anxiety disorder Mother   . Lung cancer Father   . Heart attack Father   . Alcohol abuse Father   . Hypertension Brother   . Alcohol abuse Brother   . Bipolar disorder Brother   . ADD / ADHD Brother   . Drug abuse Brother   . OCD Brother   . Alcohol abuse Sister   . Bipolar disorder Sister   . Anxiety disorder Sister   . ADD / ADHD Sister   . Drug abuse Sister    . Physical abuse Sister   . Sexual abuse Sister   . Alcohol abuse Brother   . Bipolar disorder Brother   . ADD / ADHD Brother   . Drug abuse Brother   . Alcohol abuse Sister   . Bipolar disorder Sister   . Anxiety disorder Sister   . ADD / ADHD Sister   . Physical abuse Sister   . Sexual abuse Sister   . Alcohol abuse Brother   . Bipolar disorder Brother   . ADD / ADHD Brother   . Bipolar disorder Brother   . ADD / ADHD Brother   . Alcohol abuse Brother   . Paranoid behavior Brother   . Physical abuse Brother   . Sexual abuse Brother   . Bipolar disorder Maternal Aunt   . Bipolar disorder Paternal Aunt   . Bipolar disorder Maternal Uncle   . Bipolar disorder Paternal Uncle   . Bipolar disorder Maternal Grandfather   . Alcohol abuse Maternal Grandfather   . Bipolar disorder Maternal Grandmother   . Alcohol abuse Maternal Grandmother   . Anxiety disorder Maternal Grandmother   . Dementia Maternal Grandmother   . Bipolar disorder Paternal Grandfather   . Alcohol abuse Paternal Grandfather   . Bipolar disorder Paternal Grandmother   . Alcohol abuse Paternal Grandmother   . Drug abuse Paternal Grandmother   . Bipolar disorder Maternal Uncle   . Schizophrenia Neg Hx   . Seizures Neg Hx    History  Substance Use Topics  . Smoking status: Current Every Day Smoker -- 0.25 packs/day for 40 years    Types: Cigarettes, E-cigarettes  . Smokeless tobacco: Never Used     Comment:  1 ppd  . Alcohol Use: No   OB History    Gravida Para Term Preterm AB TAB SAB Ectopic Multiple Living   2 1 1  1   1  1      Review of Systems  Unable to perform ROS: Mental status change      Allergies  Codeine; Cyclobenzaprine; Darvocet; Abilify; Ace inhibitors; Adhesive; Gabapentin; Iodine; Levofloxacin; Prednisone; Sulfa antibiotics; and Robaxin  Home Medications   Prior to Admission medications   Medication Sig Start Date End Date Taking? Authorizing Provider  albuterol (PROVENTIL  HFA;VENTOLIN HFA) 108 (90 BASE) MCG/ACT inhaler Inhale 2 puffs into the lungs 3 (three) times daily as needed for wheezing or shortness of breath. 11/10/13  Yes Robbie Lis, MD  albuterol (PROVENTIL) (2.5 MG/3ML) 0.083% nebulizer solution Take 3 mLs (2.5 mg total) by nebulization every 4 (four) hours as needed for wheezing or shortness of breath. 11/10/13  Yes Robbie Lis, MD  clonazePAM (KLONOPIN) 1 MG tablet Take 1 tablet (1 mg total) by mouth 4 (four) times daily. 01/08/14  Yes Levonne Spiller, MD  dexlansoprazole (DEXILANT) 60 MG capsule Take 1 capsule (60 mg  total) by mouth daily. 09/04/13  Yes Butch Penny, NP  diphenhydrAMINE-zinc acetate (BENADRYL) cream Apply topically 3 (three) times daily as needed for itching. Apply to affected area 11/10/13  Yes Robbie Lis, MD  divalproex (DEPAKOTE ER) 500 MG 24 hr tablet Take 1 tablet (500 mg total) by mouth daily. 01/08/14  Yes Levonne Spiller, MD  donepezil (ARICEPT) 5 MG tablet Take 1 tablet (5 mg total) by mouth at bedtime. 06/18/13  Yes Alycia Rossetti, MD  DULoxetine (CYMBALTA) 60 MG capsule Take 1 capsule (60 mg total) by mouth every morning. 01/08/14  Yes Levonne Spiller, MD  EPINEPHrine (EPIPEN) 0.3 mg/0.3 mL IJ SOAJ injection Inject 0.3 mLs (0.3 mg total) into the muscle once. PRN FOR SEVERE ALLERGIC REACTION 08/21/13  Yes Rexene Alberts, MD  feeding supplement, ENSURE COMPLETE, (ENSURE COMPLETE) LIQD Take 237 mLs by mouth daily. 09/21/13  Yes Robbie Lis, MD  fentaNYL (DURAGESIC) 75 MCG/HR Place 1 patch (75 mcg total) onto the skin every 3 (three) days. 12/29/13  Yes Alycia Rossetti, MD  levothyroxine (SYNTHROID, LEVOTHROID) 25 MCG tablet Take 25 mcg by mouth at bedtime.    Yes Historical Provider, MD  lidocaine (LIDODERM) 5 % APPLY ONE PATCH TO SKIN EVERY 12 HOURS. 12 HOURS OFF. 12/22/13  Yes Alycia Rossetti, MD  lidocaine-prilocaine (EMLA) cream Apply 1 application topically as needed. Apply to port 1 hr before chemo Patient taking differently:  Apply 1 application topically as needed (Apply to port 1 hr before chemo).  09/25/13  Yes Curt Bears, MD  Linaclotide [facility]) 290 MCG CAPS capsule Take 1 capsule (290 mcg total) by mouth daily. 01/27/14  Yes Butch Penny, NP  loratadine (CLARITIN) 10 MG tablet Take 1 tablet (10 mg total) by mouth daily. 11/10/13  Yes Robbie Lis, MD  metoprolol tartrate (LOPRESSOR) 25 MG tablet Take take 1/2 of a tablet twice a day. 12/23/13  Yes Alycia Rossetti, MD  Naloxegol Oxalate (MOVANTIK) 25 MG TABS Take 1 tablet by mouth daily. 01/08/14  Yes Alycia Rossetti, MD  ondansetron (ZOFRAN) 4 MG tablet Take 1 tablet (4 mg total) by mouth every 6 (six) hours as needed for refractory nausea / vomiting. 01/05/14  Yes Alycia Rossetti, MD  oxyCODONE-acetaminophen (PERCOCET) 7.5-325 MG per tablet Take 1 tablet by mouth every 6 (six) hours as needed for pain. 01/20/14  Yes Mary B Dixon, PA-C  pantoprazole (PROTONIX) 40 MG tablet TAKE (1) TABLET BY MOUTH DAILY. 10/26/13  Yes Alycia Rossetti, MD  pravastatin (PRAVACHOL) 40 MG tablet TAKE (1) TABLET BY MOUTH EVERY EVENING FOR HIGH CHOLESTEROL. 01/18/14  Yes Alycia Rossetti, MD  predniSONE (DELTASONE) 2.5 MG tablet Take 3 tablets (7.5 mg total) by mouth daily with breakfast. 05/26/13  Yes Nena Polio, PA-C  [place]   Yes Historical Provider, MD  Tiotropium Bromide Monohydrate (SPIRIVA RESPIMAT) 2.5 MCG/ACT AERS Inhale 2 puffs into the lungs daily.   Yes Historical Provider, MD  amitriptyline (ELAVIL) 100 MG tablet Take one and one half tablets at bedtime Patient taking differently: Take 150 mg by mouth at bedtime.  01/08/14   Levonne Spiller, MD   BP 106/74 mmHg  Pulse 76  Temp(Src) 98.7 F (37.1 C) (Oral)  Resp 18  SpO2 91% Physical Exam  Constitutional:  Groggy  HENT:  Head: Normocephalic and atraumatic.  Eyes: Conjunctivae and EOM are normal. Pupils are equal, round, and reactive to light.  Neck: Normal range of motion. Neck supple.  Cardiovascular: Normal rate and regular rhythm.   Pulmonary/Chest: Effort normal and breath sounds normal.  Abdominal: Soft. Bowel sounds are normal.  Musculoskeletal: Normal range of motion.  Neurological:  Answers questions slowly, moves all extremities  Skin: Skin is warm and dry.  Psychiatric: She has a normal mood and affect. Her behavior is normal.  Nursing note and vitals reviewed.   ED Course  Procedures (including critical care time) Labs Review Labs Reviewed  COMPREHENSIVE METABOLIC PANEL - Abnormal; Notable for the following:    Sodium 130 (*)    Chloride 91 (*)    GFR calc non Af Amer 58 (*)    GFR calc Af Amer 67 (*)    All other components within normal limits  URINE RAPID DRUG SCREEN (HOSP PERFORMED) - Abnormal; Notable for the following:    Opiates POSITIVE (*)    Benzodiazepines POSITIVE (*)    All other components within normal limits  CBC WITH DIFFERENTIAL/PLATELET  ETHANOL    Imaging Review Ct Head Wo Contrast  01/30/2014   CLINICAL DATA:  Altered mental status.  EXAM: CT HEAD WITHOUT CONTRAST  TECHNIQUE: Contiguous axial images were obtained from the base of the skull through the vertex without intravenous contrast.  COMPARISON:  CT scan of November 06, 2013.  FINDINGS: Bony calvarium appears intact. Stable mild diffuse cortical atrophy. No mass effect or midline shift is noted. Ventricular size is within normal limits. There is no evidence of mass lesion, hemorrhage or acute infarction.  IMPRESSION: Stable mild diffuse cortical atrophy. No acute intracranial abnormality seen.   Electronically Signed   By: Sabino Dick M.D.   On: 01/30/2014 12:15     EKG Interpretation None      MDM   Final diagnoses:  Altered mental state    Uncertain etiology of patient's altered mental status. CT head shows no acute findings. Urine drug screen positive for opiates and benzodiazepines. Urinalysis pending. Will admit.    Nat Christen, MD 01/30/14 [phone]

## 2014-01-30 NOTE — ED Notes (Signed)
Phlebotomy at bedside.

## 2014-01-30 NOTE — ED Notes (Signed)
Pt transported to CT ?

## 2014-01-30 NOTE — ED Notes (Signed)
Pt reports she woke up this am and was disoriented. Told ems she didn't know where she was. Called her niece who is a [employer] scientist and was advised to call 911. EMS reports pt's story of what happened kept changing. Pt is on many antidepressants and pain meds. Pt apparently just had a brain scan for concerns of mets, but reports it was negative. EMS vitals stable.

## 2014-01-30 NOTE — H&P (Addendum)
[facility] History and Physical  Tonya Sullivan KXF:[id number] DOB: 1955/12/15 DOA: 01/30/2014  Referring physician: ER physician PCP: Vic Blackbird, MD   Chief Complaint: altered mental status  HPI:  59 year old female with past medical history of small cell lung cancer status post 4 cycles of cisplatin and etoposide, completed 10/29/13, concurrent radiation therapy (40/60 planned treatments) with treatment course complicated by noncompliance, history of hypothyroidism, bipolar disorder, hypertensin who presented to [facility] ED for evaluation of worsening confusion as noted per home health nurse. Patient is groggy and unable to give history of present illness. No reports of respiratory distress, no fevers or cough. No reports of nausea or vomiting. No reports of blood in stool or urine. No reports of diarrhea or constipation.  In ED, BP was 87/55 but has improved to 119/82 with IV fluids. Blood work revealed sodium of 130 otherwise unremarkable. CT head showed no acute intracranial findings. No clear cause of confusion so patient admitted for further evaluation.    Assessment & Plan    Principal Problem: Acute encephalopathy - unclear etiology at this time - CT head unremarkable for acute findings - UDS positive for opiates and benzos; ethanol less than 5 - if mental status does not improve by am may consider MRI brain for further evaluation - TSH pending - UA showed small leukocytes but rare bacteria. Since no fever and normal WBC count we did not start antibiotics. - of note, pt is on aricept for memory loss so she could also have slight dementia   Active Problems: Hypothyroidism - check TSH - continue synthroid   Anxiety and depression - continue clonazepam, elavil, cymbalta  Essential hypertension - continue metoprolol 12.5 mg PO BID  History of small cell lung cancer - status post chemotherapy and RT; RT not completed as mentioned above due to non  compliance  COPD - stable respiratory status - continue spiriva   H/O adrenal insufficiency - on daily prednisone - on PPI therapy as well since she is on daily prednisone   Tobacco use - nicotine patch ordered   Mild protein calorie malnutrition - in the context of chronic illness - mildly reduced albumin level, 3.3 - continue nutritional supplementation   DVT prophylaxis:  - SCD's bilaterally   Radiological Exams on Admission: Ct Head Wo Contrast 01/30/2014  Stable mild diffuse cortical atrophy. No acute intracranial abnormality seen.   Electronically Signed   By: Sabino Dick M.D.   On: 01/30/2014 12:15    Code Status: Full Family Communication: Family not at the bedside   Disposition Plan: Admit for further evaluation  Leisa Lenz, MD  [facility] Pager [phone]  Review of Systems:  Unable to obtain due to altered mental status  Past Medical History  Diagnosis Date  . COPD (chronic obstructive pulmonary disease)   . Depression   . Chronic back pain     DDD, disc bulge, radiculopathy, spinal stenosis  . Hyperlipidemia   . Anxiety   . Bipolar disorder, unspecified   . Carotid artery calcification   . DDD (degenerative disc disease), lumbosacral   . Spinal stenosis, lumbar   . Autoimmune thyroiditis   . Essential hypertension, benign   . TIA (transient ischemic attack)     August 2014  . GERD (gastroesophageal reflux disease)   . Neuropathic pain   . Addison's disease     On Prednisone  . History of pneumonia   . Achalasia   . Frequent falls   . Hypothyroidism   .  Melanoma   . 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 DM (diabetes mellitus) with complications    Past Surgical History  Procedure Laterality Date  . Appendectomy    . Cholecystectomy    . Lump left breast      Benign  . Abdominal hysterectomy      Tubal pregnancy  . Inguinal hernia repair    . Stomach surgery    . Ectopic pregnancy surgery    . Back surgery      x  5;1984;1989;;1999;2000;2010  . Colonoscopy  01/25/2012    Procedure: COLONOSCOPY;  Surgeon: Rogene Houston, MD;  Location: [facility];  Service: Endoscopy;  Laterality: N/A;  130  . Esophagogastroduodenoscopy (egd) with esophageal dilation N/A 08/13/2012    Procedure: ESOPHAGOGASTRODUODENOSCOPY (EGD) WITH ESOPHAGEAL DILATION;  Surgeon: Rogene Houston, MD;  Location: [facility];  Service: Endoscopy;  Laterality: N/A;  315  . Anterior cervical decomp/discectomy fusion N/A 10/21/2012    Procedure: Cervical Six-Seven Anterior cervical decompression/diskectomy/fusion;  Surgeon: Kristeen Miss, MD;  Location: [facility];  Service: Neurosurgery;  Laterality: N/A;  Cervical Six-Seven Anterior cervical decompression/diskectomy/fusion  . Cataract extraction w/phaco Right 11/25/2012    Procedure: CATARACT EXTRACTION PHACO AND INTRAOCULAR LENS PLACEMENT (IOC);  Surgeon: Elta Guadeloupe T. Gershon Crane, MD;  Location: [facility];  Service: Ophthalmology;  Laterality: Right;  CDE:10.06  . Cataract extraction w/phaco Left 12/09/2012    Procedure: CATARACT EXTRACTION PHACO AND INTRAOCULAR LENS PLACEMENT (IOC);  Surgeon: Elta Guadeloupe T. Gershon Crane, MD;  Location: [facility];  Service: Ophthalmology;  Laterality: Left;  CDE:5.06  . Tonsillectomy    . Hiatal hernia repair    . Heller myotomy N/A 03/17/2013    Procedure: DIAGNOSTIC LAPAROSCOPY, LAPAROSCOPIC HELLER MYOTOMY, ENDOSCOPY, DOR FUNDOPLICATION;  Surgeon: Ralene Ok, MD;  Location: [facility];  Service: General;  Laterality: N/A;  . Breast surgery      Left breast  . Eye surgery    . Video bronchoscopy with endobronchial ultrasound N/A 07/24/2013    Procedure: VIDEO BRONCHOSCOPY WITH ENDOBRONCHIAL ULTRASOUND;  Surgeon: [name], MD;  Location: [facility];  Service: Thoracic;  Laterality: N/A;   Social History:  reports that she has been smoking Cigarettes and E-cigarettes.  She has a 10 pack-year smoking history. She has never used smokeless tobacco. She reports that she does not drink  alcohol or use illicit drugs.  Allergies  Allergen Reactions  . Codeine Itching  . Cyclobenzaprine Hives  . Darvocet [Propoxyphene N-Acetaminophen] Rash  . Abilify [Aripiprazole] Other (See Comments)    tremors  . Ace Inhibitors Other (See Comments)    unknown  . Adhesive [Tape] Hives  . Gabapentin Swelling  . Iodine Hives    Topical iodine, not allergic to  IV contrast  . Levofloxacin Hives and Hypertension  . Prednisone Hives, Itching and Hypertension    Can't take more than 7.5mg    . Sulfa Antibiotics Itching  . Robaxin [Methocarbamol] Rash    Family History:  Family History  Problem Relation Age of Onset  . Hypertension Mother   . Heart disease Mother   . Heart attack Mother   . Anxiety disorder Mother   . Lung cancer Father   . Heart attack Father   . Alcohol abuse Father   . Hypertension Brother   . Alcohol abuse Brother   . Bipolar disorder Brother   . ADD / ADHD Brother   . Drug abuse Brother   . OCD Brother   . Alcohol abuse Sister   .  Bipolar disorder Sister   . Anxiety disorder Sister   . ADD / ADHD Sister   . Drug abuse Sister   . Physical abuse Sister   . Sexual abuse Sister   . Alcohol abuse Brother   . Bipolar disorder Brother   . ADD / ADHD Brother   . Drug abuse Brother   . Alcohol abuse Sister   . Bipolar disorder Sister   . Anxiety disorder Sister   . ADD / ADHD Sister   . Physical abuse Sister   . Sexual abuse Sister   . Alcohol abuse Brother   . Bipolar disorder Brother   . ADD / ADHD Brother   . Bipolar disorder Brother   . ADD / ADHD Brother   . Alcohol abuse Brother   . Paranoid behavior Brother   . Physical abuse Brother   . Sexual abuse Brother   . Bipolar disorder Maternal Aunt   . Bipolar disorder Paternal Aunt   . Bipolar disorder Maternal Uncle   . Bipolar disorder Paternal Uncle   . Bipolar disorder Maternal Grandfather   . Alcohol abuse Maternal Grandfather   . Bipolar disorder Maternal Grandmother   . Alcohol  abuse Maternal Grandmother   . Anxiety disorder Maternal Grandmother   . Dementia Maternal Grandmother   . Bipolar disorder Paternal Grandfather   . Alcohol abuse Paternal Grandfather   . Bipolar disorder Paternal Grandmother   . Alcohol abuse Paternal Grandmother   . Drug abuse Paternal Grandmother   . Bipolar disorder Maternal Uncle   . Schizophrenia Neg Hx   . Seizures Neg Hx      Prior to Admission medications   Medication Sig Start Date End Date Taking? Authorizing Provider  albuterol (PROVENTIL HFA;VENTOLIN HFA) 108 (90 BASE) MCG/ACT inhaler Inhale 2 puffs into the lungs 3 (three) times daily as needed for wheezing or shortness of breath. 11/10/13  Yes Robbie Lis, MD  albuterol (PROVENTIL) (2.5 MG/3ML) 0.083% nebulizer solution Take 3 mLs (2.5 mg total) by nebulization every 4 (four) hours as needed for wheezing or shortness of breath. 11/10/13  Yes Robbie Lis, MD  clonazePAM (KLONOPIN) 1 MG tablet Take 1 tablet (1 mg total) by mouth 4 (four) times daily. 01/08/14  Yes Levonne Spiller, MD  dexlansoprazole (DEXILANT) 60 MG capsule Take 1 capsule (60 mg total) by mouth daily. 09/04/13  Yes Butch Penny, NP  diphenhydrAMINE-zinc acetate (BENADRYL) cream Apply topically 3 (three) times daily as needed for itching. Apply to affected area 11/10/13  Yes Robbie Lis, MD  divalproex (DEPAKOTE ER) 500 MG 24 hr tablet Take 1 tablet (500 mg total) by mouth daily. 01/08/14  Yes Levonne Spiller, MD  donepezil (ARICEPT) 5 MG tablet Take 1 tablet (5 mg total) by mouth at bedtime. 06/18/13  Yes Alycia Rossetti, MD  DULoxetine (CYMBALTA) 60 MG capsule Take 1 capsule (60 mg total) by mouth every morning. 01/08/14  Yes Levonne Spiller, MD  EPINEPHrine (EPIPEN) 0.3 mg/0.3 mL IJ SOAJ injection Inject 0.3 mLs (0.3 mg total) into the muscle once. PRN FOR SEVERE ALLERGIC REACTION 08/21/13  Yes Rexene Alberts, MD  feeding supplement, ENSURE COMPLETE, (ENSURE COMPLETE) LIQD Take 237 mLs by mouth daily. 09/21/13  Yes Robbie Lis, MD  fentaNYL (DURAGESIC) 75 MCG/HR Place 1 patch (75 mcg total) onto the skin every 3 (three) days. 12/29/13  Yes Alycia Rossetti, MD  levothyroxine (SYNTHROID, LEVOTHROID) 25 MCG tablet Take 25 mcg by mouth at bedtime.  Yes Historical Provider, MD  lidocaine (LIDODERM) 5 % APPLY ONE PATCH TO SKIN EVERY 12 HOURS. 12 HOURS OFF. 12/22/13  Yes Alycia Rossetti, MD  lidocaine-prilocaine (EMLA) cream Apply 1 application topically as needed. Apply to port 1 hr before chemo Patient taking differently: Apply 1 application topically as needed (Apply to port 1 hr before chemo).  09/25/13  Yes Curt Bears, MD  Linaclotide [facility]) 290 MCG CAPS capsule Take 1 capsule (290 mcg total) by mouth daily. 01/27/14  Yes Butch Penny, NP  loratadine (CLARITIN) 10 MG tablet Take 1 tablet (10 mg total) by mouth daily. 11/10/13  Yes Robbie Lis, MD  metoprolol tartrate (LOPRESSOR) 25 MG tablet Take take 1/2 of a tablet twice a day. 12/23/13  Yes Alycia Rossetti, MD  Naloxegol Oxalate (MOVANTIK) 25 MG TABS Take 1 tablet by mouth daily. 01/08/14  Yes Alycia Rossetti, MD  ondansetron (ZOFRAN) 4 MG tablet Take 1 tablet (4 mg total) by mouth every 6 (six) hours as needed for refractory nausea / vomiting. 01/05/14  Yes Alycia Rossetti, MD  oxyCODONE-acetaminophen (PERCOCET) 7.5-325 MG per tablet Take 1 tablet by mouth every 6 (six) hours as needed for pain. 01/20/14  Yes Mary B Dixon, PA-C  pantoprazole (PROTONIX) 40 MG tablet TAKE (1) TABLET BY MOUTH DAILY. 10/26/13  Yes Alycia Rossetti, MD  pravastatin (PRAVACHOL) 40 MG tablet TAKE (1) TABLET BY MOUTH EVERY EVENING FOR HIGH CHOLESTEROL. 01/18/14  Yes Alycia Rossetti, MD  predniSONE (DELTASONE) 2.5 MG tablet Take 3 tablets (7.5 mg total) by mouth daily with breakfast. 05/26/13  Yes Nena Polio, PA-C  [place]   Yes Historical Provider, MD  Tiotropium Bromide Monohydrate (SPIRIVA RESPIMAT) 2.5 MCG/ACT AERS Inhale 2 puffs into the  lungs daily.   Yes Historical Provider, MD  amitriptyline (ELAVIL) 100 MG tablet Take one and one half tablets at bedtime Patient taking differently: Take 150 mg by mouth at bedtime.  01/08/14   Levonne Spiller, MD   Physical Exam: Filed Vitals:   01/30/14 1118 01/30/14 1330 01/30/14 1430  BP: 108/74 115/80 106/74  Pulse: 80 79 76  Temp: 98.6 F (37 C) 98.8 F (37.1 C) 98.7 F (37.1 C)  TempSrc: Oral Oral Oral  Resp: 12 16 18   SpO2: 95% 90% 91%    Physical Exam  Constitutional: Appears well-developed and well-nourished. No distress.  HENT: Normocephalic. No tonsillar erythema or exudates Eyes: Conjunctivae are normal. PERRLA, no scleral icterus.  Neck: Normal ROM. Neck supple. No JVD. No tracheal deviation. No thyromegaly.  CVS: RRR, S1/S2 appreciated  Pulmonary: Effort and breath sounds normal Abdominal: Soft. BS +,  no distension, tenderness, rebound or guarding.  Musculoskeletal: Normal range of motion. No edema and no tenderness.  Lymphadenopathy: No lymphadenopathy noted, cervical, inguinal. Neuro: Alert.  No focal neurologic deficits. Skin: Skin is warm and dry. No rash noted.  No erythema. No pallor.  Psychiatric: Normal mood and affect. Behavior, judgment, thought content normal.   Labs on Admission:  Basic Metabolic Panel:  Recent Labs Lab 01/30/14 1134  NA 130*  K 4.3  CL 91*  CO2 30  GLUCOSE 99  BUN 11  CREATININE 1.04  CALCIUM 8.7   Liver Function Tests:  Recent Labs Lab 01/30/14 1134  AST 25  ALT 13  ALKPHOS 60  BILITOT 0.4  PROT 6.8  ALBUMIN 3.5   No results for input(s): LIPASE, AMYLASE in the last 168 hours. No results for input(s): AMMONIA in the  last 168 hours. CBC:  Recent Labs Lab 01/30/14 1134  WBC 7.2  NEUTROABS 5.2  HGB 12.7  HCT 39.5  MCV 93.4  PLT 214   Cardiac Enzymes: No results for input(s): CKTOTAL, CKMB, CKMBINDEX, TROPONINI in the last 168 hours. BNP: Invalid input(s): POCBNP CBG: No results for input(s): GLUCAP  in the last 168 hours.  If 7PM-7AM, please contact night-coverage [email or web address] Password [email or web address] 01/30/2014, 3:59 PM

## 2014-01-31 ENCOUNTER — Inpatient Hospital Stay (HOSPITAL_COMMUNITY): Payer: Medicare Other

## 2014-01-31 DIAGNOSIS — R404 Transient alteration of awareness: Secondary | ICD-10-CM

## 2014-01-31 LAB — COMPREHENSIVE METABOLIC PANEL
ALBUMIN: 3.3 g/dL — AB (ref 3.5–5.2)
ALT: 12 U/L (ref 0–35)
AST: 22 U/L (ref 0–37)
Alkaline Phosphatase: 55 U/L (ref 39–117)
Anion gap: 9 (ref 5–15)
BILIRUBIN TOTAL: 0.4 mg/dL (ref 0.3–1.2)
BUN: 11 mg/dL (ref 6–23)
CO2: 33 mmol/L — ABNORMAL HIGH (ref 19–32)
CREATININE: 0.97 mg/dL (ref 0.50–1.10)
Calcium: 8.7 mg/dL (ref 8.4–10.5)
Chloride: 96 mmol/L (ref 96–112)
GFR calc Af Amer: 73 mL/min — ABNORMAL LOW (ref >90)
GFR, EST NON AFRICAN AMERICAN: 63 mL/min — AB (ref >90)
Glucose, Bld: 99 mg/dL (ref 70–99)
POTASSIUM: 4.8 mmol/L (ref 3.5–5.1)
Sodium: 138 mmol/L (ref 135–145)
TOTAL PROTEIN: 6.4 g/dL (ref 6.0–8.3)

## 2014-01-31 LAB — CBC
HCT: 37.9 % (ref 36.0–46.0)
HEMOGLOBIN: 11.9 g/dL — AB (ref 12.0–15.0)
MCH: 29.8 pg (ref 26.0–34.0)
MCHC: 31.4 g/dL (ref 30.0–36.0)
MCV: 95 fL (ref 78.0–100.0)
Platelets: 214 10*3/uL (ref 150–400)
RBC: 3.99 MIL/uL (ref 3.87–5.11)
RDW: 15.4 % (ref 11.5–15.5)
WBC: 4.5 10*3/uL (ref 4.0–10.5)

## 2014-01-31 LAB — URINE CULTURE: Colony Count: 50000

## 2014-01-31 LAB — GLUCOSE, CAPILLARY: Glucose-Capillary: 117 mg/dL — ABNORMAL HIGH (ref 70–99)

## 2014-01-31 MED ORDER — LIDOCAINE 5 % EX PTCH
1.0000 | MEDICATED_PATCH | CUTANEOUS | Status: DC
  Administered 2014-01-31 – 2014-02-01 (×2): 1 via TRANSDERMAL
  Filled 2014-01-31 (×3): qty 1

## 2014-01-31 MED ORDER — LIDOCAINE 5 % EX PTCH
1.0000 | MEDICATED_PATCH | Freq: Two times a day (BID) | CUTANEOUS | Status: DC
  Filled 2014-01-31 (×2): qty 1

## 2014-01-31 MED ORDER — IPRATROPIUM BROMIDE 0.02 % IN SOLN
0.5000 mg | Freq: Four times a day (QID) | RESPIRATORY_TRACT | Status: DC
  Administered 2014-01-31 – 2014-02-01 (×6): 0.5 mg via RESPIRATORY_TRACT
  Filled 2014-01-31 (×5): qty 2.5

## 2014-01-31 MED ORDER — LEVALBUTEROL HCL 0.63 MG/3ML IN NEBU
0.6300 mg | INHALATION_SOLUTION | Freq: Four times a day (QID) | RESPIRATORY_TRACT | Status: DC
  Administered 2014-01-31 – 2014-02-01 (×6): 0.63 mg via RESPIRATORY_TRACT
  Filled 2014-01-31 (×7): qty 3

## 2014-01-31 MED ORDER — MORPHINE SULFATE 2 MG/ML IJ SOLN
1.0000 mg | INTRAMUSCULAR | Status: DC | PRN
  Administered 2014-01-31 – 2014-02-01 (×6): 1 mg via INTRAVENOUS
  Filled 2014-01-31 (×6): qty 1

## 2014-01-31 MED ORDER — FENTANYL 75 MCG/HR TD PT72
75.0000 ug | MEDICATED_PATCH | TRANSDERMAL | Status: DC
Start: 2014-01-31 — End: 2014-02-01
  Administered 2014-01-31: 75 ug via TRANSDERMAL
  Filled 2014-01-31: qty 1

## 2014-01-31 MED ORDER — GUAIFENESIN ER 600 MG PO TB12
1200.0000 mg | ORAL_TABLET | Freq: Two times a day (BID) | ORAL | Status: DC
  Administered 2014-01-31 – 2014-02-01 (×3): 1200 mg via ORAL
  Filled 2014-01-31 (×4): qty 2

## 2014-01-31 NOTE — Progress Notes (Addendum)
Patient asked to be discharged. Paged [place] MD. He came and discussed with patient need to be consulted by palliative care. Patient agreed to stay.

## 2014-01-31 NOTE — Progress Notes (Signed)
[facility] PROGRESS NOTE  [name] TIR:[id number] DOB: 1955/01/05 DOA: 01/30/2014 PCP: Vic Blackbird, MD  Assessment/Plan: 1. Altered mental status- resolved at this time, unclear etiology. CT head is negative, urine drug screen positive for opiates and benzos. UA is clear, patient is afebrile normal white count. Will continue to monitor the patient in the hospital 2. Acute bronchitis- patient has wheezing bilaterally, will start her on DuoNeb nebulizers every 6 hours, Mucinex 1 tablet by mouth twice a day. Patient does not want high-dose steroids that it makes her confused. 3. Constipation- abdomen x-ray was obtained in the hospital which only showed nonspecific bowel gas pattern. But there comment on the lung bases showed possibility of right lower lobe mass versus scarring postradiation. 4. ? Lung mass- abdominal x-ray commented on the lung bases which showed possibility of right lower lobe mass versus scarring postradiation, I discussed in detail with patient regarding getting another study including the CT scan of the chest. Patient is not interested in getting CT scan at this time, she does not want further treatments. Will call patient's oncologist Dr. Julien Nordmann in a.m. to discuss further recommendations. 5. Hypothyroidism- continue Synthroid, TSH 0.495. 6. History of anxiety and depression- continue clonazepam, Elavil, Cymbalta. 7. History of small cell lung cancer- patient status post chemotherapy, patient did not complete her radiation treatments 8. History of and insufficiency- continue prednisone     Code Status: Full code Family Communication: *No family at bedside Disposition Plan: Home when stable  Consultants:  None  Procedures:  None  Antibiotics:  *None  HPI/Subjective: 59 year old female with a history of small cell lung cancer status post 4 cycles of cisplatin and etoposide completed 10/19/2013, concurrent radiation therapy with treatment course  complicated by noncompliance, history of hypothyroidism, bipolar disorder who presented to the ED with worsening confusion over the past few days.  This morning patient is alert and oriented 3, calm , communicating well. As per patient she gets intermittent episodes of confusion and she becomes very anxious. Called and discussed with [place], who says that patient gets intermittently confused and Garbled speech when calling on phone. CT head done yesterday was negative. As per Arbie Cookey patient also complained of constipation.  Objective: Filed Vitals:   01/31/14 0450  BP: 122/62  Pulse: 93  Temp: 96.7 F (35.9 C)  Resp: 14   No intake or output data in the 24 hours ending 01/31/14 1358 Filed Weights   01/31/14 0450  Weight: 95.119 kg (209 lb 11.2 oz)    Exam:  Physical Exam: Eyes: No icterus, extraocular muscles intact  Lungs: Bilateral wheezing Heart: Regular rate and rhythm, S1 and S2 normal, no murmurs, rubs auscultated Abdomen: BS normoactive,soft,nondistended,non-tender to palpation,no organomegaly Extremities: No pretibial edema, no erythema, no cyanosis, no clubbing Neuro : Alert and oriented to time, place and person, No focal deficits  Data Reviewed: Basic Metabolic Panel:  Recent Labs Lab 01/30/14 1134 01/30/14 1830 01/31/14 0525  NA 130* 134* 138  K 4.3 4.1 4.8  CL 91* 94* 96  CO2 30 31 33*  GLUCOSE 99 143* 99  BUN 11 10 11   CREATININE 1.04 0.94 0.97  CALCIUM 8.7 8.7 8.7  MG  --  2.1  --   PHOS  --  3.9  --    Liver Function Tests:  Recent Labs Lab 01/30/14 1134 01/30/14 1830 01/31/14 0525  AST 25 28 22   ALT 13 13 12   ALKPHOS 60 57 55  BILITOT 0.4 0.5 0.4  PROT  6.8 6.7 6.4  ALBUMIN 3.5 3.3* 3.3*   No results for input(s): LIPASE, AMYLASE in the last 168 hours. No results for input(s): AMMONIA in the last 168 hours. CBC:  Recent Labs Lab 01/30/14 1134 01/30/14 1830 01/31/14 0525  WBC 7.2 5.3 4.5  NEUTROABS 5.2 3.5  --   HGB 12.7  12.3 11.9*  HCT 39.5 37.8 37.9  MCV 93.4 93.3 95.0  PLT 214 210 214   Cardiac Enzymes: No results for input(s): CKTOTAL, CKMB, CKMBINDEX, TROPONINI in the last 168 hours. BNP (last 3 results)  Recent Labs  08/20/13 0613  PROBNP 1866.0*   CBG:  Recent Labs Lab 01/31/14 0801  GLUCAP 117*    No results found for this or any previous visit (from the past 240 hour(s)).   Studies: Ct Head Wo Contrast  01/30/2014   CLINICAL DATA:  Altered mental status.  EXAM: CT HEAD WITHOUT CONTRAST  TECHNIQUE: Contiguous axial images were obtained from the base of the skull through the vertex without intravenous contrast.  COMPARISON:  CT scan of November 06, 2013.  FINDINGS: Bony calvarium appears intact. Stable mild diffuse cortical atrophy. No mass effect or midline shift is noted. Ventricular size is within normal limits. There is no evidence of mass lesion, hemorrhage or acute infarction.  IMPRESSION: Stable mild diffuse cortical atrophy. No acute intracranial abnormality seen.   Electronically Signed   By: Sabino Dick M.D.   On: 01/30/2014 12:15   Dg Abd 2 Views  01/31/2014   CLINICAL DATA:  59 year old female with history of constipation and right-sided abdominal pain.  EXAM: ABDOMEN - 2 VIEW  COMPARISON:  Abdominal radiograph 08/21/2014.  FINDINGS: Surgical clips project over the right upper quadrant of the abdomen, compatible with prior cholecystectomy. Gas and stool are seen scattered throughout the colon extending to the level of the distal rectum. No pathologic distension of small bowel is noted. No gross evidence of pneumoperitoneum. Postoperative changes in the lower lumbar spine with PLIF at L3-L4, an interbody cages at L4-L5 and L5-S1 again noted. Incidentally imaging of the lower thorax demonstrates a mass like opacity in the perihilar aspect of the right lung base.  IMPRESSION: 1. Nonobstructive bowel gas pattern. 2. No pneumoperitoneum. 3. Mass like opacity in the perihilar aspect of the  right lung base. This may simply represent some postprocedural scarring from radiation therapy, however, the possibility of residual or recurrent disease in this region is not excluded in this patient with history of lung cancer, and repeat contrast enhanced chest CT should be considered in the near future for further evaluation if clinically appropriate.   Electronically Signed   By: Vinnie Langton M.D.   On: 01/31/2014 09:57    Scheduled Meds: . amitriptyline  150 mg Oral QHS  . clonazePAM  1 mg Oral QID  . divalproex  500 mg Oral Daily  . donepezil  5 mg Oral QHS  . DULoxetine  60 mg Oral q morning - 10a  . feeding supplement (ENSURE COMPLETE)  237 mL Oral Daily  . fentaNYL  75 mcg Transdermal Q72H  . guaiFENesin  1,200 mg Oral BID  . ipratropium  0.5 mg Nebulization Q6H  . levalbuterol  0.63 mg Nebulization Q6H  . levothyroxine  25 mcg Oral QHS  . lidocaine  1 patch Transdermal Q24H  . Linaclotide  290 mcg Oral Daily  . loratadine  10 mg Oral Daily  . metoprolol tartrate  12.5 mg Oral BID  . Naloxegol Oxalate  25 mg  Oral q morning - 10a  . nicotine  14 mg Transdermal Daily  . pantoprazole  40 mg Oral Daily  . pravastatin  40 mg Oral QHS  . predniSONE  7.5 mg Oral Q breakfast   Continuous Infusions: . sodium chloride 50 mL/hr at 01/30/14 1830    Active Problems:   Altered mental status    Time spent: 25 min    [facility] Pager [phone]. If 7PM-7AM, please contact night-coverage at [email or web address], password [facility] 01/31/2014, 1:58 PM  LOS: 1 day

## 2014-01-31 NOTE — Progress Notes (Signed)
With patient's level of disorientation and prior psych history asked [place] MD if psych consult needed to be ordered. He stated he did not think one was necessary at this current moment. Will continue to monitor patient.

## 2014-01-31 NOTE — Progress Notes (Signed)
Patient stated fentanyl patch needed to be replaced. Called pharmacy and had med resecheduled.

## 2014-01-31 NOTE — Progress Notes (Signed)
Patient stated she would like to be discharged. Patient stated she had a doctor's appointment tomorrow she could not miss. Patient stated she thought conversation [place] MD and I had earlier today was done at "night time". Patient stated she did not realize she had to stay overnight. Patient asked to speak with [place] MD in person. After conversation with [place] MD, patient agreed she needed to stay.

## 2014-01-31 NOTE — Progress Notes (Addendum)
Patient asked for pain medication. Stated she had not received any all day. I clarified that morphine had been given at 0834 and 1246. I then administered another PRN dose at 1639.  Patient asked for klonopin. Patient stated she had no received any all day. I clarified that scheduled klonopin had been given at 1005 and 1405.   Patient stated she had not received any medication all day. I clarified the times in which I had given scheduled medication today.   Patient also stated she had not received her meal. I called to clarify if she had ordered. Nutrition clarified her meal was on its way.   Patient stated she had not received anything to drink all day. I clarified the drinks I had brought her throughout the day.

## 2014-02-01 ENCOUNTER — Telehealth: Payer: Self-pay | Admitting: Family Medicine

## 2014-02-01 ENCOUNTER — Ambulatory Visit: Payer: Medicare Other | Admitting: Family Medicine

## 2014-02-01 ENCOUNTER — Telehealth: Payer: Self-pay | Admitting: *Deleted

## 2014-02-01 ENCOUNTER — Telehealth (INDEPENDENT_AMBULATORY_CARE_PROVIDER_SITE_OTHER): Payer: Self-pay | Admitting: *Deleted

## 2014-02-01 DIAGNOSIS — E2740 Unspecified adrenocortical insufficiency: Secondary | ICD-10-CM

## 2014-02-01 DIAGNOSIS — F319 Bipolar disorder, unspecified: Secondary | ICD-10-CM

## 2014-02-01 DIAGNOSIS — C3491 Malignant neoplasm of unspecified part of right bronchus or lung: Secondary | ICD-10-CM

## 2014-02-01 DIAGNOSIS — E039 Hypothyroidism, unspecified: Secondary | ICD-10-CM

## 2014-02-01 LAB — GLUCOSE, CAPILLARY: Glucose-Capillary: 179 mg/dL — ABNORMAL HIGH (ref 70–99)

## 2014-02-01 MED ORDER — CLONAZEPAM 0.5 MG PO TBDP
1.0000 mg | ORAL_TABLET | ORAL | Status: DC | PRN
  Administered 2014-02-01: 1 mg via ORAL
  Filled 2014-02-01: qty 2

## 2014-02-01 MED ORDER — DIVALPROEX SODIUM ER 500 MG PO TB24: 750.0000 mg | ORAL_TABLET | Freq: Every day | ORAL | Status: DC

## 2014-02-01 MED ORDER — AMITRIPTYLINE HCL 150 MG PO TABS: 150.0000 mg | ORAL_TABLET | Freq: Every day | ORAL | Status: AC

## 2014-02-01 MED ORDER — OXYCODONE HCL 20 MG/ML PO CONC: 10.0000 mg | ORAL | Status: DC | PRN

## 2014-02-01 MED ORDER — FENTANYL 100 MCG/HR TD PT72: 100.0000 ug | MEDICATED_PATCH | TRANSDERMAL | Status: DC

## 2014-02-01 MED ORDER — CLONAZEPAM 1 MG PO TABS: 1.0000 mg | ORAL_TABLET | Freq: Three times a day (TID) | ORAL | Status: DC

## 2014-02-01 MED ORDER — CLONAZEPAM 1 MG PO TBDP: 1.0000 mg | ORAL_TABLET | ORAL | Status: AC | PRN

## 2014-02-01 MED ORDER — HEPARIN SOD (PORK) LOCK FLUSH 100 UNIT/ML IV SOLN
500.0000 [IU] | Freq: Once | INTRAVENOUS | Status: DC
  Filled 2014-02-01: qty 5

## 2014-02-01 MED ORDER — SENNOSIDES-DOCUSATE SODIUM 8.6-50 MG PO TABS: 2.0000 | ORAL_TABLET | Freq: Every evening | ORAL | Status: DC | PRN

## 2014-02-01 MED ORDER — OXYCODONE HCL 20 MG/ML PO CONC
10.0000 mg | ORAL | Status: DC | PRN
  Administered 2014-02-01: 10 mg via ORAL
  Filled 2014-02-01: qty 1

## 2014-02-01 MED ORDER — FENTANYL 100 MCG/HR TD PT72: 100.0000 ug | MEDICATED_PATCH | TRANSDERMAL | Status: AC

## 2014-02-01 NOTE — Progress Notes (Signed)
Clinical Social Work Department BRIEF PSYCHOSOCIAL ASSESSMENT 02/01/2014  Patient:  Tonya Sullivan, Tonya Sullivan     Account Number:  [id number]     Admit date:  01/30/2014  Clinical Social Worker:  Maryln Manuel  Date/Time:  02/01/2014 09:42 AM  Referred by:  Physician  Date Referred:  02/01/2014 Referred for  Other - See comment   Other Referral:   RN reports that pt requesting to speak with CSW   Interview type:  Patient Other interview type:    PSYCHOSOCIAL DATA Living Status:  ALONE Admitted from facility:   Level of care:   Primary support name:  Thayer Headings Robertson/mother/[phone] Primary support relationship to patient:  PARENT Degree of support available:   adequate    CURRENT CONCERNS Current Concerns  Other - See comment   Other Concerns:    SOCIAL WORK ASSESSMENT / PLAN CSW received referral from RN that pt requesting to speak with CSW.    CSW met with pt at bedside. CSW introduced self and explained role. Pt expressed that she is in pain, but knows that MD is working to ensure that her pain is managed. Support provided. CSW inquired with pt about her concerns and pt expressed that she has been concerned about the care she is receiving in the hospital. Pt shared that her bed is not being straightened or changed and her room is not being picked up. CSW provided support and expressed understanding. CSW discussed with pt that if pt agreeable then CSW will share concerns with Arts administrator of nursing unit in order for pt concerns to be addressed. Pt agreeable.    Unit Assistant Director, Drue Dun entered room as CSW was leaving and CSW notified Unit Surveyor, quantity of pt concerns and pt expressed her concerns to [employer] as well.    Pt appreciative of CSW visit and support.    No further social work needs identified at this time.    CSW signing off.   Assessment/plan status:  No Further Intervention Required Other assessment/ plan:    Information/referral to community resources:   Referral to Unit Surveyor, quantity in order to address pt concerns.    PATIENT'S/FAMILY'S RESPONSE TO PLAN OF CARE: Pt alert and oriented x 4. Pt hopeful to discharge home today once she is able to meet with palliative care team re: her pain management. Pt appreciative of CSW visiting and planned to further discuss her concerns with Unit Surveyor, quantity.    No further social work needs identified at this time.    CSW signing off.   Alison Murray, MSW, [place] Work [phone]

## 2014-02-01 NOTE — Telephone Encounter (Signed)
Noted, given verbal for palliative care, pt declines imaging to see if any active lesions on chest or in brain, continue mental health meds, adjustments made while admitted, No active infections noted

## 2014-02-01 NOTE — Consult Note (Signed)
[facility] Consult   Reason for Consult:  Medication management for bipolar depression and anxiety Referring Physician:  Dr. Darrick Meigs  Patient Identification: Tonya Sullivan MRN:  [id number] Principal Diagnosis: Bipolar disorder  Diagnosis:   Patient Active Problem List   Diagnosis Date Noted  . Altered mental status [R41.82] 01/30/2014  . Altered mental state [R41.82]   . Essential hypertension [I10]   . Esophageal reflux [K21.9]   . Hyponatremia [E87.1]   . Small cell carcinoma of lung [C34.90] 01/21/2014  . Constipation due to pain medication [K59.09, T50.905A] 01/10/2014  . Leg swelling [M79.89] 01/10/2014  . Antineoplastic chemotherapy induced pancytopenia [D61.810, T45.1X5A] 11/16/2013  . Tachycardia [R00.0] 11/16/2013  . Hypotension [I95.9] 11/16/2013  . COPD exacerbation [J44.1] 11/09/2013  . Allergic reaction [T78.40XA] 11/08/2013  . Adrenal insufficiency [E27.40] 11/07/2013  . Hypothyroidism [E03.9] 11/07/2013  . Dyslipidemia [E78.5] 11/07/2013  . COPD (chronic obstructive pulmonary disease) [J44.9] 11/07/2013  . Small cell lung cancer [C34.90] 11/06/2013  . General weakness [R53.1] 11/06/2013  . Falls [phone]] 11/06/2013  . Headache [R51] 11/06/2013  . Obesity, morbid [E66.01] 08/20/2013  . GERD (gastroesophageal reflux disease) [K21.9] 07/16/2013  . Stricture and stenosis of esophagus [K22.2] 08/03/2012  . Chronic back pain [M54.9, G89.29] 02/19/2011  . Bipolar disorder [F31.9] 02/19/2011  . Depression [F32.9] 09/26/2010    Total Time spent with patient: 45 minutes  Subjective:   [name] is a 59 y.o. female patient admitted with bipolar disorder medication managment.  HPI:  Patient seen, chart reviewed and case discussed with LCSW. Patient stated that she has stopped her chemo and radiation therapy for few months ago and seeing therapist Peggy and Dr. Harrington Challenger at [place] [facility] and taking medication depakote, klonopin and Elavil. Patient  stated that the medications are helping her but has morning groggy feeling. She also reported that she has been scared of receiving radiation to her brain and dying alone. she could not stay with her son's family when given a trial, so she has decided to stay by herself. She denied active suicide or homicide ideation, intention or plans. She has no evidence of psychosis. Patient fears seems to be based on facts and she has been stressed about it and she needs individual and group support therapies.   Medical history: 59 year old female with a history of small cell lung cancer status post 4 cycles of cisplatin and etoposide completed 10/19/2013, concurrent radiation therapy with treatment course complicated by noncompliance, history of hypothyroidism, bipolar disorder who presented to the ED with worsening confusion over the past few days.  This morning patient is alert and oriented 3, calm , communicating well. As per patient she gets intermittent episodes of confusion and she becomes very anxious. Called and discussed with [place], who says that patient gets intermittently confused and Garbled speech when calling on phone. CT head done yesterday was negative. As per Arbie Cookey patient also complained of constipation.  HPI Elements:   Location:  Bipolar disorder. Quality:  poor. Severity:  increased anxiety and fear of death. Timing:  stopped cancer treatment. Duration:  few months. Context:  lives along.  Past Medical History:  Past Medical History  Diagnosis Date  . COPD (chronic obstructive pulmonary disease)   . Depression   . Chronic back pain     DDD, disc bulge, radiculopathy, spinal stenosis  . Hyperlipidemia   . Anxiety   . Bipolar disorder, unspecified   . Carotid artery calcification   . DDD (degenerative disc disease), lumbosacral   .  Spinal stenosis, lumbar   . Autoimmune thyroiditis   . Essential hypertension, benign   . TIA (transient ischemic attack)     August 2014   . GERD (gastroesophageal reflux disease)   . Neuropathic pain   . Addison's disease     On Prednisone  . History of pneumonia   . Achalasia   . Frequent falls   . Hypothyroidism   . Melanoma   . 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 DM (diabetes mellitus) with complications     Past Surgical History  Procedure Laterality Date  . Appendectomy    . Cholecystectomy    . Lump left breast      Benign  . Abdominal hysterectomy      Tubal pregnancy  . Inguinal hernia repair    . Stomach surgery    . Ectopic pregnancy surgery    . Back surgery      x 5;1984;1989;;1999;2000;2010  . Colonoscopy  01/25/2012    Procedure: COLONOSCOPY;  Surgeon: Rogene Houston, MD;  Location: [facility];  Service: Endoscopy;  Laterality: N/A;  130  . Esophagogastroduodenoscopy (egd) with esophageal dilation N/A 08/13/2012    Procedure: ESOPHAGOGASTRODUODENOSCOPY (EGD) WITH ESOPHAGEAL DILATION;  Surgeon: Rogene Houston, MD;  Location: [facility];  Service: Endoscopy;  Laterality: N/A;  315  . Anterior cervical decomp/discectomy fusion N/A 10/21/2012    Procedure: Cervical Six-Seven Anterior cervical decompression/diskectomy/fusion;  Surgeon: Kristeen Miss, MD;  Location: [facility];  Service: Neurosurgery;  Laterality: N/A;  Cervical Six-Seven Anterior cervical decompression/diskectomy/fusion  . Cataract extraction w/phaco Right 11/25/2012    Procedure: CATARACT EXTRACTION PHACO AND INTRAOCULAR LENS PLACEMENT (IOC);  Surgeon: Elta Guadeloupe T. Gershon Crane, MD;  Location: [facility];  Service: Ophthalmology;  Laterality: Right;  CDE:10.06  . Cataract extraction w/phaco Left 12/09/2012    Procedure: CATARACT EXTRACTION PHACO AND INTRAOCULAR LENS PLACEMENT (IOC);  Surgeon: Elta Guadeloupe T. Gershon Crane, MD;  Location: [facility];  Service: Ophthalmology;  Laterality: Left;  CDE:5.06  . Tonsillectomy    . Hiatal hernia repair    . Heller myotomy N/A 03/17/2013    Procedure: DIAGNOSTIC LAPAROSCOPY, LAPAROSCOPIC HELLER MYOTOMY, ENDOSCOPY,  DOR FUNDOPLICATION;  Surgeon: Ralene Ok, MD;  Location: [facility];  Service: General;  Laterality: N/A;  . Breast surgery      Left breast  . Eye surgery    . Video bronchoscopy with endobronchial ultrasound N/A 07/24/2013    Procedure: VIDEO BRONCHOSCOPY WITH ENDOBRONCHIAL ULTRASOUND;  Surgeon: [name], MD;  Location: [facility];  Service: Thoracic;  Laterality: N/A;   Family History:  Family History  Problem Relation Age of Onset  . Hypertension Mother   . Heart disease Mother   . Heart attack Mother   . Anxiety disorder Mother   . Lung cancer Father   . Heart attack Father   . Alcohol abuse Father   . Hypertension Brother   . Alcohol abuse Brother   . Bipolar disorder Brother   . ADD / ADHD Brother   . Drug abuse Brother   . OCD Brother   . Alcohol abuse Sister   . Bipolar disorder Sister   . Anxiety disorder Sister   . ADD / ADHD Sister   . Drug abuse Sister   . Physical abuse Sister   . Sexual abuse Sister   . Alcohol abuse Brother   . Bipolar disorder Brother   . ADD / ADHD Brother   . Drug abuse Brother   . Alcohol abuse Sister   .  Bipolar disorder Sister   . Anxiety disorder Sister   . ADD / ADHD Sister   . Physical abuse Sister   . Sexual abuse Sister   . Alcohol abuse Brother   . Bipolar disorder Brother   . ADD / ADHD Brother   . Bipolar disorder Brother   . ADD / ADHD Brother   . Alcohol abuse Brother   . Paranoid behavior Brother   . Physical abuse Brother   . Sexual abuse Brother   . Bipolar disorder Maternal Aunt   . Bipolar disorder Paternal Aunt   . Bipolar disorder Maternal Uncle   . Bipolar disorder Paternal Uncle   . Bipolar disorder Maternal Grandfather   . Alcohol abuse Maternal Grandfather   . Bipolar disorder Maternal Grandmother   . Alcohol abuse Maternal Grandmother   . Anxiety disorder Maternal Grandmother   . Dementia Maternal Grandmother   . Bipolar disorder Paternal Grandfather   . Alcohol abuse Paternal Grandfather   .  Bipolar disorder Paternal Grandmother   . Alcohol abuse Paternal Grandmother   . Drug abuse Paternal Grandmother   . Bipolar disorder Maternal Uncle   . Schizophrenia Neg Hx   . Seizures Neg Hx    Social History:  History  Alcohol Use No     History  Drug Use No    History   Social History  . Marital Status: Widowed    Spouse Name: N/A    Number of Children: 1  . Years of Education: N/A   Occupational History  . Diabled    Social History Main Topics  . Smoking status: Current Every Day Smoker -- 0.25 packs/day for 40 years    Types: Cigarettes, E-cigarettes  . Smokeless tobacco: Never Used     Comment:  1 ppd  . Alcohol Use: No  . Drug Use: No  . Sexual Activity: No   Other Topics Concern  . None   Social History Narrative   Widow.  Lives in [place] alone.  Has home health aide.   Additional Social History:                          Allergies:   Allergies  Allergen Reactions  . Codeine Itching  . Cyclobenzaprine Hives  . Darvocet [Propoxyphene N-Acetaminophen] Rash  . Abilify [Aripiprazole] Other (See Comments)    tremors  . Ace Inhibitors Other (See Comments)    unknown  . Adhesive [Tape] Hives  . Gabapentin Swelling  . Iodine Hives    Topical iodine, not allergic to  IV contrast  . Levofloxacin Hives and Hypertension  . Prednisone Hives, Itching and Hypertension    Can't take more than 7.5mg    . Sulfa Antibiotics Itching  . Robaxin [Methocarbamol] Rash    Vitals: Blood pressure 119/70, pulse 87, temperature 98.3 F (36.8 C), temperature source Oral, resp. rate 16, height 5\' 7"  (1.702 m), weight 95.119 kg (209 lb 11.2 oz), SpO2 97 %.  Risk to Self: Is patient at risk for suicide?: No Risk to Others:   Prior Inpatient Therapy:   Prior Outpatient Therapy:    Current Facility-Administered Medications  Medication Dose Route Frequency Provider Last Rate Last Dose  . albuterol (PROVENTIL) (2.5 MG/3ML) 0.083% nebulizer solution 2.5 mg   2.5 mg Nebulization Q4H PRN Robbie Lis, MD      . amitriptyline (ELAVIL) tablet 150 mg  150 mg Oral QHS Robbie Lis, MD   150 mg at  01/31/14 2125  . clonazePAM (KLONOPIN) tablet 1 mg  1 mg Oral QID Robbie Lis, MD   1 mg at 02/01/14 0737  . divalproex (DEPAKOTE ER) 24 hr tablet 500 mg  500 mg Oral Daily Robbie Lis, MD   500 mg at 02/01/14 0954  . donepezil (ARICEPT) tablet 5 mg  5 mg Oral QHS Robbie Lis, MD   5 mg at 01/31/14 2124  . DULoxetine (CYMBALTA) DR capsule 60 mg  60 mg Oral q morning - 10a Robbie Lis, MD   60 mg at 02/01/14 0954  . feeding supplement (ENSURE COMPLETE) (ENSURE COMPLETE) liquid 237 mL  237 mL Oral Daily Robbie Lis, MD   237 mL at 02/01/14 1003  . fentaNYL (DURAGESIC - dosed mcg/hr) 75 mcg  75 mcg Transdermal Q72H Oswald Hillock, MD   75 mcg at 01/31/14 1246  . guaiFENesin (MUCINEX) 12 hr tablet 1,200 mg  1,200 mg Oral BID Oswald Hillock, MD   1,200 mg at 02/01/14 0954  . ipratropium (ATROVENT) nebulizer solution 0.5 mg  0.5 mg Nebulization Q6H Oswald Hillock, MD   0.5 mg at 02/01/14 0843  . levalbuterol (XOPENEX) nebulizer solution 0.63 mg  0.63 mg Nebulization Q6H Oswald Hillock, MD   0.63 mg at 02/01/14 0843  . levothyroxine (SYNTHROID, LEVOTHROID) tablet 25 mcg  25 mcg Oral QHS Robbie Lis, MD   25 mcg at 01/31/14 2125  . lidocaine (LIDODERM) 5 % 1 patch  1 patch Transdermal Q24H Oswald Hillock, MD   1 patch at 02/01/14 0745  . Linaclotide (LINZESS) capsule 290 mcg  290 mcg Oral Daily Robbie Lis, MD   290 mcg at 01/31/14 1005  . loratadine (CLARITIN) tablet 10 mg  10 mg Oral Daily Robbie Lis, MD   10 mg at 02/01/14 0954  . metoprolol tartrate (LOPRESSOR) tablet 12.5 mg  12.5 mg Oral BID Robbie Lis, MD   12.5 mg at 02/01/14 0748  . morphine 2 MG/ML injection 1 mg  1 mg Intravenous Q4H PRN Oswald Hillock, MD   1 mg at 02/01/14 1117  . Naloxegol Oxalate TABS 25 mg  25 mg Oral q morning - 10a Oswald Hillock, MD   25 mg at 01/31/14 1000  . nicotine (NICODERM  CQ - dosed in mg/24 hours) patch 14 mg  14 mg Transdermal Daily Robbie Lis, MD   14 mg at 02/01/14 0957  . ondansetron (ZOFRAN) tablet 4 mg  4 mg Oral Q6H PRN Robbie Lis, MD       Or  . ondansetron [facility]) injection 4 mg  4 mg Intravenous Q6H PRN Robbie Lis, MD      . pantoprazole (PROTONIX) EC tablet 40 mg  40 mg Oral Daily Robbie Lis, MD   40 mg at 02/01/14 0955  . pravastatin (PRAVACHOL) tablet 40 mg  40 mg Oral QHS Robbie Lis, MD   40 mg at 01/31/14 2124  . predniSONE (DELTASONE) tablet 7.5 mg  7.5 mg Oral Q breakfast Robbie Lis, MD   7.5 mg at 02/01/14 0745    Musculoskeletal: Strength & Muscle Tone: within normal limits Gait & Station: normal Patient leans: N/A  Psychiatric Specialty Exam: Physical Exam  ROS  Blood pressure 119/70, pulse 87, temperature 98.3 F (36.8 C), temperature source Oral, resp. rate 16, height 5\' 7"  (1.702 m), weight 95.119 kg (209 lb 11.2 oz), SpO2 97 %.Body mass  index is 32.84 kg/(m^2).  General Appearance: Guarded  Eye Contact::  Good  Speech:  Clear and Coherent and Slow  Volume:  Decreased  Mood:  Anxious, Depressed and Hopeless  Affect:  Congruent and Depressed  Thought Process:  Coherent and Goal Directed  Orientation:  Full (Time, Place, and Person)  Thought Content:  Rumination  Suicidal Thoughts:  No  Homicidal Thoughts:  No  Memory:  Immediate;   Fair Recent;   Fair  Judgement:  Intact  Insight:  Fair  Psychomotor Activity:  Decreased  Concentration:  Good  Recall:  Good  Fund of Knowledge:Good  Language: Good  Akathisia:  NA  Handed:  Right  AIMS (if indicated):     Assets:  Communication Skills Desire for Improvement Financial Resources/Insurance Housing Leisure Time Resilience Social Support  ADL's:  Intact  Cognition: Impaired,  Mild  Sleep:      Medical Decision Making: Review of Psycho-Social Stressors (1), Review or order clinical lab tests (1), Decision to obtain old records (1), Established  Problem, Worsening (2), Review of Medication Regimen & Side Effects (2) and Review of New Medication or Change in Dosage (2)  Treatment Plan Summary: Daily contact with patient to assess and evaluate symptoms and progress in treatment and Medication management  Plan:  Will check her valproic acid level to prevent toxic doses No evidence of imminent risk to self or others at present.   Patient does not meet criteria for psychiatric inpatient admission. Supportive therapy provided about ongoing stressors. Increase depakote 750 mg QHS for mood swings, decrease Klonopin 1 mg  TID to reduce sedation during day time and continue Elavil 150 mg PO Qhs for insomnia and mood.    Disposition: Patient does not meet criteria of acute psych hospitalization May discharge to out patient psychiatric services at [facility] clinic when medically stable  Ksenia Kunz,JANARDHAHA R. 02/01/2014 12:03 PM

## 2014-02-01 NOTE — Telephone Encounter (Signed)
Call placed to patient.   Advised of MD recommendations.

## 2014-02-01 NOTE — Telephone Encounter (Signed)
Received call from [place], [place] with Hospice and Palliative Care.   Requested to know if MD would be attending. MD made aware and agreed to be attending with Hospice providing symptom management.

## 2014-02-01 NOTE — Care Management Note (Addendum)
    Page 1 of 1   02/01/2014     4:28:50 PM CARE MANAGEMENT NOTE 02/01/2014  Patient:  Tonya Sullivan, Tonya Sullivan   Account Number:  [id number]  Date Initiated:  02/01/2014  Documentation initiated by:  Dessa Phi  Subjective/Objective Assessment:   59 y/o f admitted w/AMS.TA:EWYB Ca.     Action/Plan:   From home.   Anticipated DC Date:  02/01/2014   Anticipated DC Plan:  [place]  CM consult      Choice offered to / List presented to:  C-1 Patient        [place] arranged  HH-1 RN      [place]   Status of service:  Completed, signed off Medicare Important Message given?   (If response is "NO", the following Medicare IM given date fields will be blank) Date Medicare IM given:   Medicare IM given by:   Date Additional Medicare IM given:   Additional Medicare IM given by:    Discharge Disposition:  [place]  Per UR Regulation:  Reviewed for med. necessity/level of care/duration of stay  If discussed at [place] of Stay Meetings, dates discussed:    Comments:  02/01/14 Dessa Phi RN BSN NCM [phone] [place] aware of referral,& d/c.& following for d/c.No further d/c needs. Palliative cons.Psych-otpt resources.No anticipated d/c needs.

## 2014-02-01 NOTE — Telephone Encounter (Signed)
Call placed to patient.   Advised that patient is currently admitted to [facility] hospital.   Patient states that she is being consulted for Palliative care.

## 2014-02-01 NOTE — Telephone Encounter (Signed)
Patient is calling because she is really swollen she said please call her at [phone]

## 2014-02-01 NOTE — Progress Notes (Signed)
Notified patient and family request services of [facility] ([facility] after discharge.  Patient information reviewed with Dr Alferd Patee, [place] Director hospice eligibility confirmed.   Spoke with pt at bedside who was sitting on side of bed, ambulating in room without assist. Patient stated she was aware her niece Almyra Free had contacted [facility] - Patient also informed she (patient) had previously spoken with [facility]; Patient showed Probation officer a business card and informed she has PCS services through [facility]; informed pt that as of January 2016 PCS and Hospice services can co-exist but need to be co-ordinated; Cheree stated her 'to-be daughter -In -law' is the person who provides this service, she did not give me a name; Ms Accardo stated the aide does not always come regularly and she would like to review the services and try to get a schedule. Writer informed once the [facility] team was able to review her current services we would work with her.  Writer initiated education related to hospice services, philosophy and team approach to care; Ms Kentner stated "I know I don't have much time and I don't want to have any more treatment or tests they make me too exhausted and I can't take it". Confirmed with Ms Dunnigan her desire to have a DNR order and a form which she will take home today with her discharge paperwork Per notes plan is to d/c home once niece arrives to hospital- per Ms Adan her niece Almyra Free is in town she will be coming to pick her up. DME needs discussed -pt stated she has a nebulizer machine and a walker at home -'my legs just sometimes give out', she does not want any additional equipment at this time and is aware she can review her needs and request DME from [facility] team once home. Initial paperwork faxed to [facility]  Please notify [facility] when patient is ready to leave unit at d/c call [phone] (or [phone] if after 5 pm); [facility]  information and contact numbers given to patient during visit. Pt's niece Almyra Free did not arrive on unit while writer was present and [facility] to contact niece to confirm visit time for admission.  Above information shared with [facility] Please call with any questions or concerns   Danton Sewer, RN 02/01/2014, 4:06 PM [facility] Liaison [phone]

## 2014-02-01 NOTE — Discharge Summary (Signed)
Physician Discharge Summary  TEMIKA SUTPHIN SEG:[id number] DOB: March 09, 1955 DOA: 01/30/2014  PCP: Vic Blackbird, MD  Admit date: 01/30/2014 Discharge date: 02/01/2014  Time spent: *50 minutes  Recommendations for Outpatient Follow-up:  1. *Follow up PCP in 2 weeks  Discharge Diagnoses:  Active Problems:   Bipolar disorder   Adrenal insufficiency   Hypothyroidism   Small cell carcinoma of lung   Altered mental status   Discharge Condition: Stable  Diet recommendation: Regular diet  Filed Weights   01/31/14 0450  Weight: 95.119 kg (209 lb 11.2 oz)    History of present illness:  59 year old female with past medical history of small cell lung cancer status post 4 cycles of cisplatin and etoposide, completed 10/29/13, concurrent radiation therapy (40/60 planned treatments) with treatment course complicated by noncompliance, history of hypothyroidism, bipolar disorder, hypertensin who presented to [facility] ED for evaluation of worsening confusion as noted per home health nurse. Patient is groggy and unable to give history of present illness. No reports of respiratory distress, no fevers or cough. No reports of nausea or vomiting. No reports of blood in stool or urine. No reports of diarrhea or constipation.  In ED, BP was 87/55 but has improved to 119/82 with IV fluids. Blood work revealed sodium of 130 otherwise unremarkable. CT head showed no acute intracranial findings. No clear cause of confusion so patient admitted for further evaluation.    Hospital Course:  1. Altered mental status- resolved at this time, CT head is negative, urine drug screen positive for opiates and benzos. UA is clear, patient is afebrile normal white count. Psych was consulted for bipolar disorder. Palliative care also consulted for goals of care. At this time patient has decided to go home with hospice. 2. Acute bronchitis- patient had  wheezing bilaterally, was started   on DuoNeb nebulizers every 6 hours, Mucinex  1 tablet by mouth twice a day. She will continue to use albuterol neb lyses of when necessary at home. 3. Constipation- abdomen x-ray was obtained in the hospital which only showed nonspecific bowel gas pattern. But there comment on the lung bases showed possibility of right lower lobe mass versus scarring postradiation.  4. ? Lung mass- abdominal x-ray commented on the lung bases which showed possibility of right lower lobe mass versus scarring postradiation, I discussed in detail with patient regarding getting another study including the CT scan of the chest. Patient is not interested in getting CT scan at this time, she does not want further treatments. Palliative care consulted and patient has decided to go home with home hospice. 5. Hypothyroidism- continue Synthroid, TSH 0.495. 6. History of anxiety and depression- continue clonazepam, Elavil, Cymbalta. 7. History of small cell lung cancer- patient status post chemotherapy, patient did not complete her radiation treatments 8. History of and insufficiency- continue prednisone   Procedures:  *None  Consultations:  Psychiatry  Palliative care  Discharge Exam: Filed Vitals:   02/01/14 0504  BP: 119/70  Pulse: 87  Temp: 98.3 F (36.8 C)  Resp: 16    General: Appear in no acute distress Cardiovascular: S1s2 RRR Respiratory: Clear bilaterally  Discharge Instructions   Discharge Instructions    Diet - low sodium heart healthy    Complete by:  As directed      Increase activity slowly    Complete by:  As directed           Current Discharge Medication List    START taking these medications   Details  clonazePAM (KLONOPIN) 1 MG disintegrating tablet Take 1 tablet (1 mg total) by mouth every 4 (four) hours as needed for seizure (agitation, anxiety). Qty: 30 tablet, Refills: 0    oxyCODONE (ROXICODONE INTENSOL) 20 MG/ML concentrated solution Take 0.5 mLs (10 mg total) by mouth every 2 (two) hours as needed for  breakthrough pain. Qty: 30 mL, Refills: 0      CONTINUE these medications which have CHANGED   Details  amitriptyline (ELAVIL) 150 MG tablet Take 1 tablet (150 mg total) by mouth at bedtime. Qty: 30 tablet, Refills: 1      CONTINUE these medications which have NOT CHANGED   Details  albuterol (PROVENTIL HFA;VENTOLIN HFA) 108 (90 BASE) MCG/ACT inhaler Inhale 2 puffs into the lungs 3 (three) times daily as needed for wheezing or shortness of breath. Qty: 1 Inhaler, Refills: 0    albuterol (PROVENTIL) (2.5 MG/3ML) 0.083% nebulizer solution Take 3 mLs (2.5 mg total) by nebulization every 4 (four) hours as needed for wheezing or shortness of breath. Qty: 75 mL, Refills: 1    clonazePAM (KLONOPIN) 1 MG tablet Take 1 tablet (1 mg total) by mouth 4 (four) times daily. Qty: 120 tablet, Refills: 2    dexlansoprazole (DEXILANT) 60 MG capsule Take 1 capsule (60 mg total) by mouth daily. Qty: 30 capsule, Refills: 3   Associated Diagnoses: Gastroesophageal reflux disease without esophagitis    diphenhydrAMINE-zinc acetate (BENADRYL) cream Apply topically 3 (three) times daily as needed for itching. Apply to affected area Qty: 28.4 g, Refills: 0    divalproex (DEPAKOTE ER) 500 MG 24 hr tablet Take 1 tablet (500 mg total) by mouth daily. Qty: 30 tablet, Refills: 2    donepezil (ARICEPT) 5 MG tablet Take 1 tablet (5 mg total) by mouth at bedtime. Qty: 30 tablet, Refills: 3    DULoxetine (CYMBALTA) 60 MG capsule Take 1 capsule (60 mg total) by mouth every morning. Qty: 30 capsule, Refills: 2    EPINEPHrine (EPIPEN) 0.3 mg/0.3 mL IJ SOAJ injection Inject 0.3 mLs (0.3 mg total) into the muscle once. PRN FOR SEVERE ALLERGIC REACTION Qty: 1 Device, Refills: 6    feeding supplement, ENSURE COMPLETE, (ENSURE COMPLETE) LIQD Take 237 mLs by mouth daily. Qty: 10 Bottle, Refills: 0    fentaNYL (DURAGESIC) 75 MCG/HR Place 1 patch (75 mcg total) onto the skin every 3 (three) days. Qty: 10 patch,  Refills: 0    levothyroxine (SYNTHROID, LEVOTHROID) 25 MCG tablet Take 25 mcg by mouth at bedtime.     lidocaine (LIDODERM) 5 % APPLY ONE PATCH TO SKIN EVERY 12 HOURS. 12 HOURS OFF. Qty: 30 patch, Refills: 6    lidocaine-prilocaine (EMLA) cream Apply 1 application topically as needed. Apply to port 1 hr before chemo Qty: 30 g, Refills: 0   Associated Diagnoses: Small cell carcinoma of lung, unspecified laterality    Linaclotide (LINZESS) 290 MCG CAPS capsule Take 1 capsule (290 mcg total) by mouth daily. Qty: 30 capsule, Refills: 11   Associated Diagnoses: Slow transit constipation    loratadine (CLARITIN) 10 MG tablet Take 1 tablet (10 mg total) by mouth daily. Qty: 30 tablet, Refills: 0    metoprolol tartrate (LOPRESSOR) 25 MG tablet Take take 1/2 of a tablet twice a day. Qty: 30 tablet, Refills: 3    Naloxegol Oxalate (MOVANTIK) 25 MG TABS Take 1 tablet by mouth daily. Qty: 30 tablet    ondansetron (ZOFRAN) 4 MG tablet Take 1 tablet (4 mg total) by mouth every 6 (six) hours as  needed for refractory nausea / vomiting. Qty: 20 tablet, Refills: 0    pravastatin (PRAVACHOL) 40 MG tablet TAKE (1) TABLET BY MOUTH EVERY EVENING FOR HIGH CHOLESTEROL. Qty: 30 tablet, Refills: 3    predniSONE (DELTASONE) 2.5 MG tablet Take 3 tablets (7.5 mg total) by mouth daily with breakfast.    Tiotropium Bromide Monohydrate (SPIRIVA RESPIMAT) 2.5 MCG/ACT AERS Inhale 2 puffs into the lungs daily.      STOP taking these medications     oxyCODONE-acetaminophen (PERCOCET) 7.5-325 MG per tablet      pantoprazole (PROTONIX) 40 MG tablet      PRESCRIPTION MEDICATION        Allergies  Allergen Reactions  . Codeine Itching  . Cyclobenzaprine Hives  . Darvocet [Propoxyphene N-Acetaminophen] Rash  . Abilify [Aripiprazole] Other (See Comments)    tremors  . Ace Inhibitors Other (See Comments)    unknown  . Adhesive [Tape] Hives  . Gabapentin Swelling  . Iodine Hives    Topical iodine, not  allergic to  IV contrast  . Levofloxacin Hives and Hypertension  . Prednisone Hives, Itching and Hypertension    Can't take more than 7.5mg    . Sulfa Antibiotics Itching  . Robaxin [Methocarbamol] Rash      The results of significant diagnostics from this hospitalization (including imaging, microbiology, ancillary and laboratory) are listed below for reference.    Significant Diagnostic Studies: Dg Abd 1 View  01/08/2014   CLINICAL DATA:  Fecal incontinence for 2 months  EXAM: ABDOMEN - 1 VIEW  COMPARISON:  None.  FINDINGS: There is a large amount of stool throughout the colon. There is no bowel dilatation to suggest obstruction. There is no evidence of pneumoperitoneum, portal venous gas or pneumatosis. There are no pathologic calcifications along the expected course of the ureters.The osseous structures are unremarkable.  There is posterior lumbar interbody fusion at L3-4. There is interbody fusion at L4-5 and L5-S1 with interbody cage device is present.  IMPRESSION: Large amount of stool throughout the colon.   Electronically Signed   By: Kathreen Devoid   On: 01/08/2014 10:14   Ct Head Wo Contrast  01/30/2014   CLINICAL DATA:  Altered mental status.  EXAM: CT HEAD WITHOUT CONTRAST  TECHNIQUE: Contiguous axial images were obtained from the base of the skull through the vertex without intravenous contrast.  COMPARISON:  CT scan of November 06, 2013.  FINDINGS: Bony calvarium appears intact. Stable mild diffuse cortical atrophy. No mass effect or midline shift is noted. Ventricular size is within normal limits. There is no evidence of mass lesion, hemorrhage or acute infarction.  IMPRESSION: Stable mild diffuse cortical atrophy. No acute intracranial abnormality seen.   Electronically Signed   By: Sabino Dick M.D.   On: 01/30/2014 12:15   Dg Abd 2 Views  01/31/2014   CLINICAL DATA:  59 year old female with history of constipation and right-sided abdominal pain.  EXAM: ABDOMEN - 2 VIEW  COMPARISON:   Abdominal radiograph 08/21/2014.  FINDINGS: Surgical clips project over the right upper quadrant of the abdomen, compatible with prior cholecystectomy. Gas and stool are seen scattered throughout the colon extending to the level of the distal rectum. No pathologic distension of small bowel is noted. No gross evidence of pneumoperitoneum. Postoperative changes in the lower lumbar spine with PLIF at L3-L4, an interbody cages at L4-L5 and L5-S1 again noted. Incidentally imaging of the lower thorax demonstrates a mass like opacity in the perihilar aspect of the right lung base.  IMPRESSION:  1. Nonobstructive bowel gas pattern. 2. No pneumoperitoneum. 3. Mass like opacity in the perihilar aspect of the right lung base. This may simply represent some postprocedural scarring from radiation therapy, however, the possibility of residual or recurrent disease in this region is not excluded in this patient with history of lung cancer, and repeat contrast enhanced chest CT should be considered in the near future for further evaluation if clinically appropriate.   Electronically Signed   By: Vinnie Langton M.D.   On: 01/31/2014 09:57    Microbiology: Recent Results (from the past 240 hour(s))  Urine culture     Status: None   Collection Time: 01/30/14  5:32 PM  Result Value Ref Range Status   Specimen Description URINE, RANDOM  Final   Special Requests NONE  Final   Colony Count   Final    50,000 COLONIES/ML Performed at [facility]    Culture   Final    Multiple bacterial morphotypes present, none predominant. Suggest appropriate recollection if clinically indicated. Performed at [facility]    Report Status 01/31/2014 FINAL  Final     Labs: Basic Metabolic Panel:  Recent Labs Lab 01/30/14 1134 01/30/14 1830 01/31/14 0525  NA 130* 134* 138  K 4.3 4.1 4.8  CL 91* 94* 96  CO2 30 31 33*  GLUCOSE 99 143* 99  BUN 11 10 11   CREATININE 1.04 0.94 0.97  CALCIUM 8.7 8.7 8.7  MG  --   2.1  --   PHOS  --  3.9  --    Liver Function Tests:  Recent Labs Lab 01/30/14 1134 01/30/14 1830 01/31/14 0525  AST 25 28 22   ALT 13 13 12   ALKPHOS 60 57 55  BILITOT 0.4 0.5 0.4  PROT 6.8 6.7 6.4  ALBUMIN 3.5 3.3* 3.3*   No results for input(s): LIPASE, AMYLASE in the last 168 hours. No results for input(s): AMMONIA in the last 168 hours. CBC:  Recent Labs Lab 01/30/14 1134 01/30/14 1830 01/31/14 0525  WBC 7.2 5.3 4.5  NEUTROABS 5.2 3.5  --   HGB 12.7 12.3 11.9*  HCT 39.5 37.8 37.9  MCV 93.4 93.3 95.0  PLT 214 210 214   Cardiac Enzymes: No results for input(s): CKTOTAL, CKMB, CKMBINDEX, TROPONINI in the last 168 hours. BNP: BNP (last 3 results) No results for input(s): BNP in the last 8760 hours.  ProBNP (last 3 results)  Recent Labs  08/20/13 0613  PROBNP 1866.0*    CBG:  Recent Labs Lab 01/31/14 0801 02/01/14 0757  GLUCAP 117* 179*       Signed:  Sahalie Beth S  [facility] 02/01/2014, 2:52 PM

## 2014-02-01 NOTE — Telephone Encounter (Signed)
Patient is a patient at [facility] at present. I do hae any samples of Linzess right now. Try calling back end of week

## 2014-02-01 NOTE — Telephone Encounter (Signed)
Received call from Dr. Nancy Nordmann with [facility] [phone] cell/ [phone] pager.   Reports that she does think patient is appropriate for Hospice services.   Requested MD to act as Hospice Attending.   MD please advise.

## 2014-02-01 NOTE — Progress Notes (Signed)
Clinical Social Work Department CLINICAL SOCIAL WORK PSYCHIATRY SERVICE LINE ASSESSMENT 02/01/2014  Patient:  Tonya Sullivan  Account:  [id number]  [place] Date:  01/30/2014  Clinical Social Worker:  Tonya Messing, LCSW  Date/Time:  02/01/2014 01:00 PM Referred by:  Physician  Date referred:  02/01/2014 Reason for Referral  Psychosocial assessment   Presenting Symptoms/Problems (In the person's/family's own words):   Psych consulted for medication management.   Abuse/Neglect/Trauma History (check all that apply)  Denies history   Abuse/Neglect/Trauma Comments:   Psychiatric History (check all that apply)  Outpatient treatment  Inpatient/hospitilization   Psychiatric medications:  Evavil 150 mg  Klonopin 1 mg  Depakote 750 mg  Cymbalta 60 mg   Current Mental Health Hospitalizations/Previous Mental Health History:   Patient reports that she has been diagnosed with depression and is currently receiving medication management and therapy on outpatient basis.   Current provider:   [facility]   Place and Date:   [place], [place]   Current Medications:   Scheduled Meds:      . amitriptyline  150 mg Oral QHS  . clonazePAM  1 mg Oral TID AC  . [START ON 02/02/2014] divalproex  750 mg Oral Daily  . donepezil  5 mg Oral QHS  . DULoxetine  60 mg Oral q morning - 10a  . feeding supplement (ENSURE COMPLETE)  237 mL Oral Daily  . [START ON 02/03/2014] fentaNYL  100 mcg Transdermal Q72H  . guaiFENesin  1,200 mg Oral BID  . heparin lock flush  500 Units Intravenous Once  . ipratropium  0.5 mg Nebulization Q6H  . levalbuterol  0.63 mg Nebulization Q6H  . levothyroxine  25 mcg Oral QHS  . lidocaine  1 patch Transdermal Q24H  . Linaclotide  290 mcg Oral Daily  . loratadine  10 mg Oral Daily  . metoprolol tartrate  12.5 mg Oral BID  . Naloxegol Oxalate  25 mg Oral q morning - 10a  . nicotine  14 mg Transdermal Daily  . pantoprazole  40 mg Oral Daily  .  pravastatin  40 mg Oral QHS  . predniSONE  7.5 mg Oral Q breakfast        Continuous Infusions:      PRN Meds:.albuterol, clonazepam, ondansetron **OR** ondansetron (ZOFRAN) IV, oxyCODONE, senna-docusate       Previous Impatient Admission/Date/Reason:   Patient reports one admission to [facility] in the past.   Emotional Health / Current Symptoms    Suicide/Self Harm  None reported   Suicide attempt in the past:   Patient denies any SI or HI   Other harmful behavior:   None reported   Psychotic/Dissociative Symptoms  None reported   Other Psychotic/Dissociative Symptoms:   N/A    Attention/Behavioral Symptoms  Within Normal Limits   Other Attention / Behavioral Symptoms:   Patient engaged during assessment.    Cognitive Impairment  Within Normal Limits   Other Cognitive Impairment:   Patient alert and oriented.    Mood and Adjustment  Mood Congruent    Stress, Anxiety, Trauma, Any Recent Loss/Stressor  Relationship  Other - See comment  Grief/Loss (recent or history)   Anxiety (frequency):   N/A   Phobia (specify):   N/A   Compulsive behavior (specify):   N/A   Obsessive behavior (specify):   N/A   Other:   Patient reports that husband passed away 2 years ago. Patient has strained relationship with dtr-in-law. Patient has cancer and is worried  about dying alone.   Substance Abuse/Use  None   SBIRT completed (please refer for detailed history):  N  Self-reported substance use:   Patient denies any substance use.   Urinary Drug Screen Completed:  Y Alcohol level:   <5    Environmental/Housing/Living Arrangement  Stable housing   Who is in the home:   Alone   Emergency contact:  [name]  Medicare  Medicaid   Patient's Strengths and Goals (patient's own words):   Patient is compliant with medications and following up on outpatient basis.   Clinical Social Worker's Interpretive Summary:   CSW received referral in order  to complete psychosocial assessment. CSW reviewed chart and met with patient at bedside with psych MD.    Patient reports she lives at home alone and husband passed away about 2 years ago. Patient reports she has had a difficult time lately due to cancer and tried to move in with son but she had disagreements with family and quickly learned they were not treating her well and that she should not continue to live with them. Patient reports a history of depression and that she is compliant with outpatient follow up. Patient is concerned about sleeping medications. Patient addressed sleeping concerns at her 01/08/14 outpatient appointment and medications were increased but patient feels groggy in the mornings. Patient spoke with psych MD re: medication changes that could occur to assist with symptoms.    Patient engaged during assessment but tearful at times. Patient reports she is afraid of dying but does not want to spend the end of her life in the hospital. Patient has been speaking with oncologist about plans but does not want to continue treatments. Patient reports that family dynamics are difficult and she has been working with her therapist to address these concerns.    Patient contracts for safety and denies any SI or HI. Patient reports that family from out of town plan on coming to visit with her and to assist as needed so she feels confident that she will be successful returning home and following up on an outpatient basis.    CSW will continue to follow to provide support throughout hospitalization. Psych MD made medication recommendations and reports that patient can follow up on outpatient basis.   Disposition:  Recommend Psych CSW continuing to support while in hospital   [place], [place] [phone]

## 2014-02-01 NOTE — Telephone Encounter (Signed)
Discussed with palliative care- Dr. Hilma Favors attending, concern for < 6 months they feel she does fit hospice criteria

## 2014-02-01 NOTE — Telephone Encounter (Signed)
Tonya Sullivan is not feeling any better and would like for [name] to give her a call at [phone]. She feels like she has gotten worse.
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
## 2014-02-03 ENCOUNTER — Ambulatory Visit (HOSPITAL_COMMUNITY): Payer: Self-pay | Admitting: Psychology

## 2014-02-03 ENCOUNTER — Telehealth: Payer: Self-pay | Admitting: *Deleted

## 2014-02-03 NOTE — Telephone Encounter (Signed)
Pt called stating she checked her O2 sats and they read 87/60 I told pt those sounded like BP readings, She said that's what her nurse told her, I informed pt to make sure this is what she is talking about and to call me back, pt called back and states that she is waiting on nurse to call her back, I informed her she needed to contact her HOSPICE nurse to give her the readings (per [place]). Pt states she is will call her instead.

## 2014-02-03 NOTE — Telephone Encounter (Signed)
Tonya Sullivan said she is no longer in the hospital and would like to speak with [name]. Her return phone number is [phone].

## 2014-02-04 NOTE — Telephone Encounter (Signed)
No answer at home #

## 2014-02-04 NOTE — Telephone Encounter (Signed)
2nd attempt at calling patient

## 2014-02-04 NOTE — Telephone Encounter (Signed)
C/o diarrhea. Imodium BID. Patient has went into Hospice.

## 2014-02-05 ENCOUNTER — Telehealth: Payer: Self-pay | Admitting: Family Medicine

## 2014-02-05 MED ORDER — OXYCODONE-ACETAMINOPHEN 5-325 MG PO TABS: 1.0000 | ORAL_TABLET | Freq: Three times a day (TID) | ORAL | Status: AC | PRN
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
## 2014-02-07 ENCOUNTER — Emergency Department (HOSPITAL_COMMUNITY)
Admission: EM | Admit: 2014-02-07 | Discharge: 2014-03-02 | Disposition: E | Attending: Emergency Medicine | Admitting: Emergency Medicine

## 2014-02-07 DIAGNOSIS — F419 Anxiety disorder, unspecified: Secondary | ICD-10-CM | POA: Insufficient documentation

## 2014-02-07 DIAGNOSIS — E785 Hyperlipidemia, unspecified: Secondary | ICD-10-CM | POA: Insufficient documentation

## 2014-02-07 DIAGNOSIS — E063 Autoimmune thyroiditis: Secondary | ICD-10-CM | POA: Insufficient documentation

## 2014-02-07 DIAGNOSIS — Z720 Tobacco use: Secondary | ICD-10-CM | POA: Diagnosis not present

## 2014-02-07 DIAGNOSIS — Z8701 Personal history of pneumonia (recurrent): Secondary | ICD-10-CM | POA: Diagnosis not present

## 2014-02-07 DIAGNOSIS — E119 Type 2 diabetes mellitus without complications: Secondary | ICD-10-CM | POA: Diagnosis not present

## 2014-02-07 DIAGNOSIS — I10 Essential (primary) hypertension: Secondary | ICD-10-CM | POA: Insufficient documentation

## 2014-02-07 DIAGNOSIS — F319 Bipolar disorder, unspecified: Secondary | ICD-10-CM | POA: Diagnosis not present

## 2014-02-07 DIAGNOSIS — Z79899 Other long term (current) drug therapy: Secondary | ICD-10-CM | POA: Diagnosis not present

## 2014-02-07 DIAGNOSIS — C439 Malignant melanoma of skin, unspecified: Secondary | ICD-10-CM | POA: Diagnosis not present

## 2014-02-07 DIAGNOSIS — G8929 Other chronic pain: Secondary | ICD-10-CM | POA: Insufficient documentation

## 2014-02-07 DIAGNOSIS — C801 Malignant (primary) neoplasm, unspecified: Secondary | ICD-10-CM

## 2014-02-07 DIAGNOSIS — E271 Primary adrenocortical insufficiency: Secondary | ICD-10-CM | POA: Diagnosis not present

## 2014-02-07 DIAGNOSIS — K219 Gastro-esophageal reflux disease without esophagitis: Secondary | ICD-10-CM | POA: Insufficient documentation

## 2014-02-07 DIAGNOSIS — Z8673 Personal history of transient ischemic attack (TIA), and cerebral infarction without residual deficits: Secondary | ICD-10-CM | POA: Insufficient documentation

## 2014-02-07 DIAGNOSIS — J449 Chronic obstructive pulmonary disease, unspecified: Secondary | ICD-10-CM | POA: Diagnosis not present

## 2014-02-07 DIAGNOSIS — Z66 Do not resuscitate: Secondary | ICD-10-CM | POA: Insufficient documentation

## 2014-02-07 DIAGNOSIS — C3490 Malignant neoplasm of unspecified part of unspecified bronchus or lung: Secondary | ICD-10-CM | POA: Diagnosis not present

## 2014-02-08 ENCOUNTER — Telehealth: Payer: Self-pay | Admitting: Family Medicine

## 2014-02-08 NOTE — Telephone Encounter (Signed)
Call placed to pt son Harrell Gave, I last spoke to [place] on Friday night, when we discussed changing her medications which she was in agreement with  She was found unresponsive due by her [facility] care nurse, DOA upon arrival to ED.  Given my condolences, her organs are being donated per her wishes. Her son will call for any concerns

## 2014-02-10 ENCOUNTER — Ambulatory Visit (HOSPITAL_COMMUNITY): Payer: Self-pay | Admitting: Psychiatry

## 2014-03-02 ENCOUNTER — Ambulatory Visit (HOSPITAL_COMMUNITY): Payer: Medicare Other

## 2014-03-02 ENCOUNTER — Other Ambulatory Visit: Payer: Self-pay

## 2014-03-02 NOTE — ED Provider Notes (Signed)
CSN: [id number]     Arrival date & time    History   None    This chart was scribed for Richarda Blade, MD by Forrestine Him, ED Scribe. This patient was seen in room Room/bed info not found and the patient's care was started 3:27 PM.   Chief Complaint  Patient presents with  . Dead On Arrival    The history is provided by the EMS personnel. No language interpreter was used.     HPI Comments: Tonya Sullivan is a 59 y.o. female with multiple medical problems who presents to the Emergency Department dead on arrival this afternoon from home. Pt presented without any cardiac or respiratory activity and with Do Not Resuscitate documentation. Time of death called at 15:24 PM.   Past Medical History  Diagnosis Date  . COPD (chronic obstructive pulmonary disease)   . Depression   . Chronic back pain     DDD, disc bulge, radiculopathy, spinal stenosis  . Hyperlipidemia   . Anxiety   . Bipolar disorder, unspecified   . Carotid artery calcification   . DDD (degenerative disc disease), lumbosacral   . Spinal stenosis, lumbar   . Autoimmune thyroiditis   . Essential hypertension, benign   . TIA (transient ischemic attack)     August 2014  . GERD (gastroesophageal reflux disease)   . Neuropathic pain   . Addison's disease     On Prednisone  . History of pneumonia   . Achalasia   . Frequent falls   . Hypothyroidism   . Melanoma   . 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 DM (diabetes mellitus) with complications    Past Surgical History  Procedure Laterality Date  . Appendectomy    . Cholecystectomy    . Lump left breast      Benign  . Abdominal hysterectomy      Tubal pregnancy  . Inguinal hernia repair    . Stomach surgery    . Ectopic pregnancy surgery    . Back surgery      x 5;1984;1989;;1999;2000;2010  . Colonoscopy  01/25/2012    Procedure: COLONOSCOPY;  Surgeon: Rogene Houston, MD;  Location: [facility];  Service: Endoscopy;  Laterality: N/A;  130  .  Esophagogastroduodenoscopy (egd) with esophageal dilation N/A 08/13/2012    Procedure: ESOPHAGOGASTRODUODENOSCOPY (EGD) WITH ESOPHAGEAL DILATION;  Surgeon: Rogene Houston, MD;  Location: [facility];  Service: Endoscopy;  Laterality: N/A;  315  . Anterior cervical decomp/discectomy fusion N/A 10/21/2012    Procedure: Cervical Six-Seven Anterior cervical decompression/diskectomy/fusion;  Surgeon: Kristeen Miss, MD;  Location: [facility];  Service: Neurosurgery;  Laterality: N/A;  Cervical Six-Seven Anterior cervical decompression/diskectomy/fusion  . Cataract extraction w/phaco Right 11/25/2012    Procedure: CATARACT EXTRACTION PHACO AND INTRAOCULAR LENS PLACEMENT (IOC);  Surgeon: Elta Guadeloupe T. Gershon Crane, MD;  Location: [facility];  Service: Ophthalmology;  Laterality: Right;  CDE:10.06  . Cataract extraction w/phaco Left 12/09/2012    Procedure: CATARACT EXTRACTION PHACO AND INTRAOCULAR LENS PLACEMENT (IOC);  Surgeon: Elta Guadeloupe T. Gershon Crane, MD;  Location: [facility];  Service: Ophthalmology;  Laterality: Left;  CDE:5.06  . Tonsillectomy    . Hiatal hernia repair    . Heller myotomy N/A 03/17/2013    Procedure: DIAGNOSTIC LAPAROSCOPY, LAPAROSCOPIC HELLER MYOTOMY, ENDOSCOPY, DOR FUNDOPLICATION;  Surgeon: Ralene Ok, MD;  Location: [facility];  Service: General;  Laterality: N/A;  . Breast surgery      Left breast  . Eye surgery    .  Video bronchoscopy with endobronchial ultrasound N/A 07/24/2013    Procedure: VIDEO BRONCHOSCOPY WITH ENDOBRONCHIAL ULTRASOUND;  Surgeon: [name], MD;  Location: [facility];  Service: Thoracic;  Laterality: N/A;   Family History  Problem Relation Age of Onset  . Hypertension Mother   . Heart disease Mother   . Heart attack Mother   . Anxiety disorder Mother   . Lung cancer Father   . Heart attack Father   . Alcohol abuse Father   . Hypertension Brother   . Alcohol abuse Brother   . Bipolar disorder Brother   . ADD / ADHD Brother   . Drug abuse Brother   . OCD Brother   .  Alcohol abuse Sister   . Bipolar disorder Sister   . Anxiety disorder Sister   . ADD / ADHD Sister   . Drug abuse Sister   . Physical abuse Sister   . Sexual abuse Sister   . Alcohol abuse Brother   . Bipolar disorder Brother   . ADD / ADHD Brother   . Drug abuse Brother   . Alcohol abuse Sister   . Bipolar disorder Sister   . Anxiety disorder Sister   . ADD / ADHD Sister   . Physical abuse Sister   . Sexual abuse Sister   . Alcohol abuse Brother   . Bipolar disorder Brother   . ADD / ADHD Brother   . Bipolar disorder Brother   . ADD / ADHD Brother   . Alcohol abuse Brother   . Paranoid behavior Brother   . Physical abuse Brother   . Sexual abuse Brother   . Bipolar disorder Maternal Aunt   . Bipolar disorder Paternal Aunt   . Bipolar disorder Maternal Uncle   . Bipolar disorder Paternal Uncle   . Bipolar disorder Maternal Grandfather   . Alcohol abuse Maternal Grandfather   . Bipolar disorder Maternal Grandmother   . Alcohol abuse Maternal Grandmother   . Anxiety disorder Maternal Grandmother   . Dementia Maternal Grandmother   . Bipolar disorder Paternal Grandfather   . Alcohol abuse Paternal Grandfather   . Bipolar disorder Paternal Grandmother   . Alcohol abuse Paternal Grandmother   . Drug abuse Paternal Grandmother   . Bipolar disorder Maternal Uncle   . Schizophrenia Neg Hx   . Seizures Neg Hx    History  Substance Use Topics  . Smoking status: Current Every Day Smoker -- 0.25 packs/day for 40 years    Types: Cigarettes, E-cigarettes  . Smokeless tobacco: Never Used     Comment:  1 ppd  . Alcohol Use: No   OB History    Gravida Para Term Preterm AB TAB SAB Ectopic Multiple Living   2 1 1  1   1  1      Review of Systems  Unable to perform ROS: Patient unresponsive      Allergies  Codeine; Cyclobenzaprine; Darvocet; Abilify; Ace inhibitors; Adhesive; Gabapentin; Iodine; Levofloxacin; Prednisone; Sulfa antibiotics; and Robaxin  Home Medications    Prior to Admission medications   Medication Sig Start Date End Date Taking? Authorizing Provider  albuterol (PROVENTIL HFA;VENTOLIN HFA) 108 (90 BASE) MCG/ACT inhaler Inhale 2 puffs into the lungs 3 (three) times daily as needed for wheezing or shortness of breath. 11/10/13   Robbie Lis, MD  albuterol (PROVENTIL) (2.5 MG/3ML) 0.083% nebulizer solution Take 3 mLs (2.5 mg total) by nebulization every 4 (four) hours as needed for wheezing or shortness of breath. 11/10/13  Robbie Lis, MD  amitriptyline (ELAVIL) 150 MG tablet Take 1 tablet (150 mg total) by mouth at bedtime. 02/01/14   Oswald Hillock, MD  clonazePAM (KLONOPIN) 1 MG disintegrating tablet Take 1 tablet (1 mg total) by mouth every 4 (four) hours as needed for seizure (agitation, anxiety). 02/01/14   Oswald Hillock, MD  clonazePAM (KLONOPIN) 1 MG tablet Take 1 tablet (1 mg total) by mouth 4 (four) times daily. 01/08/14   Levonne Spiller, MD  dexlansoprazole (DEXILANT) 60 MG capsule Take 1 capsule (60 mg total) by mouth daily. 09/04/13   Butch Penny, NP  diphenhydrAMINE-zinc acetate (BENADRYL) cream Apply topically 3 (three) times daily as needed for itching. Apply to affected area 11/10/13   Robbie Lis, MD  divalproex (DEPAKOTE ER) 500 MG 24 hr tablet Take 1 tablet (500 mg total) by mouth daily. 01/08/14   Levonne Spiller, MD  donepezil (ARICEPT) 5 MG tablet Take 1 tablet (5 mg total) by mouth at bedtime. 06/18/13   Alycia Rossetti, MD  DULoxetine (CYMBALTA) 60 MG capsule Take 1 capsule (60 mg total) by mouth every morning. 01/08/14   Levonne Spiller, MD  EPINEPHrine (EPIPEN) 0.3 mg/0.3 mL IJ SOAJ injection Inject 0.3 mLs (0.3 mg total) into the muscle once. PRN FOR SEVERE ALLERGIC REACTION 08/21/13   Rexene Alberts, MD  feeding supplement, ENSURE COMPLETE, (ENSURE COMPLETE) LIQD Take 237 mLs by mouth daily. 09/21/13   Robbie Lis, MD  fentaNYL (DURAGESIC - DOSED MCG/HR) 100 MCG/HR Place 1 patch (100 mcg total) onto the skin every 3 (three) days. 02/03/14    Oswald Hillock, MD  levothyroxine (SYNTHROID, LEVOTHROID) 25 MCG tablet Take 25 mcg by mouth at bedtime.     Historical Provider, MD  lidocaine (LIDODERM) 5 % APPLY ONE PATCH TO SKIN EVERY 12 HOURS. 12 HOURS OFF. 12/22/13   Alycia Rossetti, MD  lidocaine-prilocaine (EMLA) cream Apply 1 application topically as needed. Apply to port 1 hr before chemo Patient taking differently: Apply 1 application topically as needed (Apply to port 1 hr before chemo).  09/25/13   Curt Bears, MD  Linaclotide [facility]) 290 MCG CAPS capsule Take 1 capsule (290 mcg total) by mouth daily. 01/27/14   Butch Penny, NP  loratadine (CLARITIN) 10 MG tablet Take 1 tablet (10 mg total) by mouth daily. 11/10/13   Robbie Lis, MD  metoprolol tartrate (LOPRESSOR) 25 MG tablet Take take 1/2 of a tablet twice a day. 12/23/13   Alycia Rossetti, MD  Naloxegol Oxalate (MOVANTIK) 25 MG TABS Take 1 tablet by mouth daily. 01/08/14   Alycia Rossetti, MD  ondansetron (ZOFRAN) 4 MG tablet Take 1 tablet (4 mg total) by mouth every 6 (six) hours as needed for refractory nausea / vomiting. 01/05/14   Alycia Rossetti, MD  oxyCODONE-acetaminophen (ROXICET) 5-325 MG per tablet Take 1 tablet by mouth 3 (three) times daily as needed for severe pain. 02/05/14   Alycia Rossetti, MD  pravastatin (PRAVACHOL) 40 MG tablet TAKE (1) TABLET BY MOUTH EVERY EVENING FOR HIGH CHOLESTEROL. 01/18/14   Alycia Rossetti, MD  predniSONE (DELTASONE) 2.5 MG tablet Take 3 tablets (7.5 mg total) by mouth daily with breakfast. 05/26/13   Nena Polio, PA-C  Tiotropium Bromide Monohydrate (SPIRIVA RESPIMAT) 2.5 MCG/ACT AERS Inhale 2 puffs into the lungs daily.    Historical Provider, MD   Triage Vitals: There were no vitals taken for this visit.   Physical Exam  Constitutional: She appears  well-developed.  HENT:  Head: Normocephalic.  Eyes:  Pupils 29mm, eccentric, nonreactive  Cardiovascular:  No cardiac activity  Pulmonary/Chest:  Apneic  Neurological: GCS  eye subscore is 1. GCS verbal subscore is 1. GCS motor subscore is 1.    ED Course  Procedures (including critical care time)   Pronounced dead on arrival  I informed the patient's son, who came to the emergency department   MDM   Final diagnoses:  Cancer  DNR (do not resuscitate)    Nursing Notes Reviewed/ Care Coordinated Applicable Imaging Reviewed Interpretation of Laboratory Data incorporated into ED treatment  I personally performed the services described in this documentation, which was scribed in my presence. The recorded information has been reviewed and is accurate.     Richarda Blade, MD 2014/02/21 [phone]

## 2014-03-02 NOTE — ED Notes (Addendum)
Charge nurse called [employer] and spoke with CBS: [id number]. Bed placement called and will call [facility] for more information. PT's sweater, bed sheet, earrings and medical braclet given to Son Fortunato Curling), POA.

## 2014-03-02 NOTE — ED Notes (Signed)
Patient arrives via EMS from home. Patient arrives DOA. EMS reports agonal rate at home in the 30s. By arrival to ED, patient in asystole.  Dr Eulis Foster at bedside patient pronounced dead at 14.

## 2014-03-02 DEATH — deceased

## 2014-03-08 ENCOUNTER — Ambulatory Visit: Payer: Self-pay | Admitting: Internal Medicine

## 2014-03-09 ENCOUNTER — Ambulatory Visit (HOSPITAL_COMMUNITY): Payer: Self-pay | Admitting: Psychiatry

## 2014-06-09 IMAGING — CR DG HIP COMPLETE 2+V*R*
3 series · 3 of 3 positions shown · non-contrast
Comparison: None.

CLINICAL DATA: Fall, hip pain

EXAM:
RIGHT HIP - COMPLETE 2+ VIEW

[t pelvis ap]
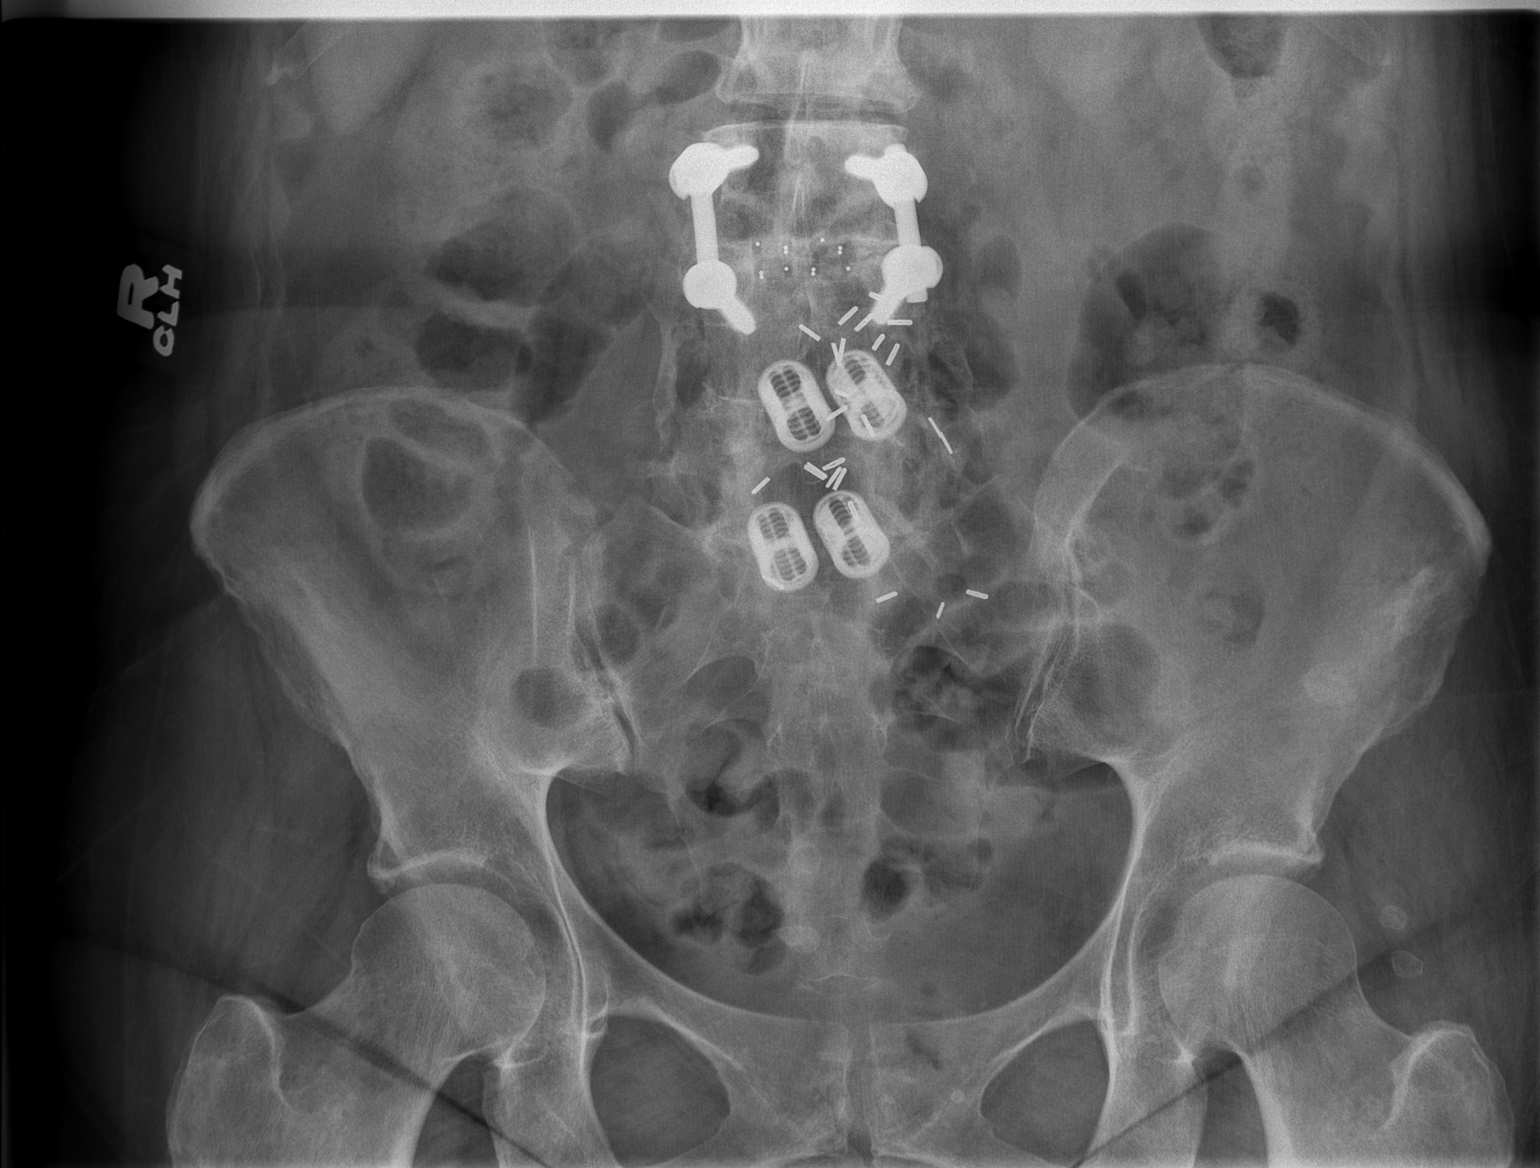

[t hip ap right]
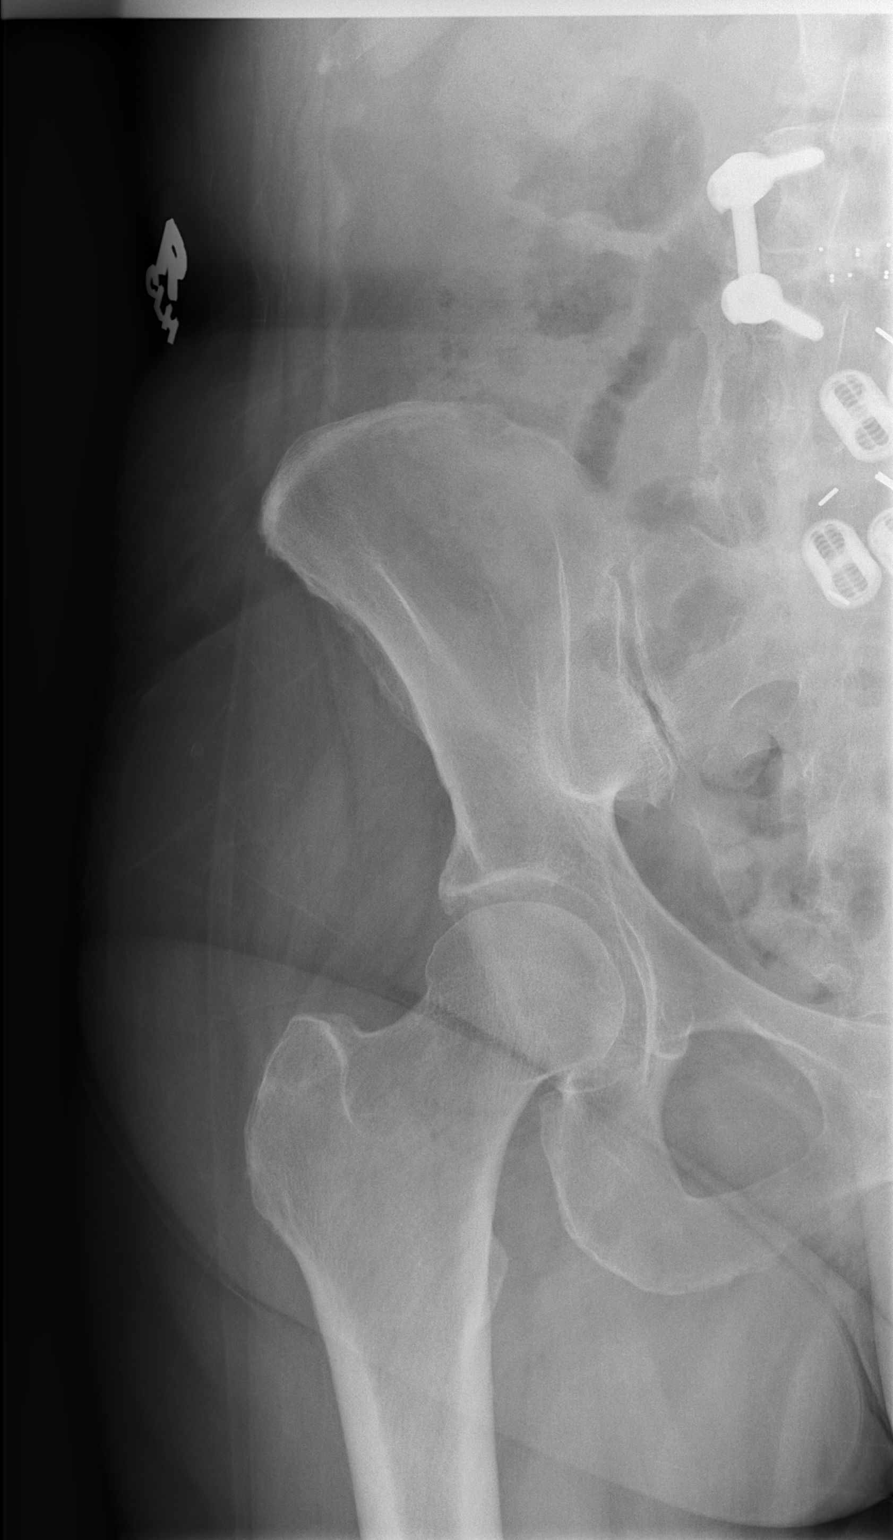

[t hip frog leg right]
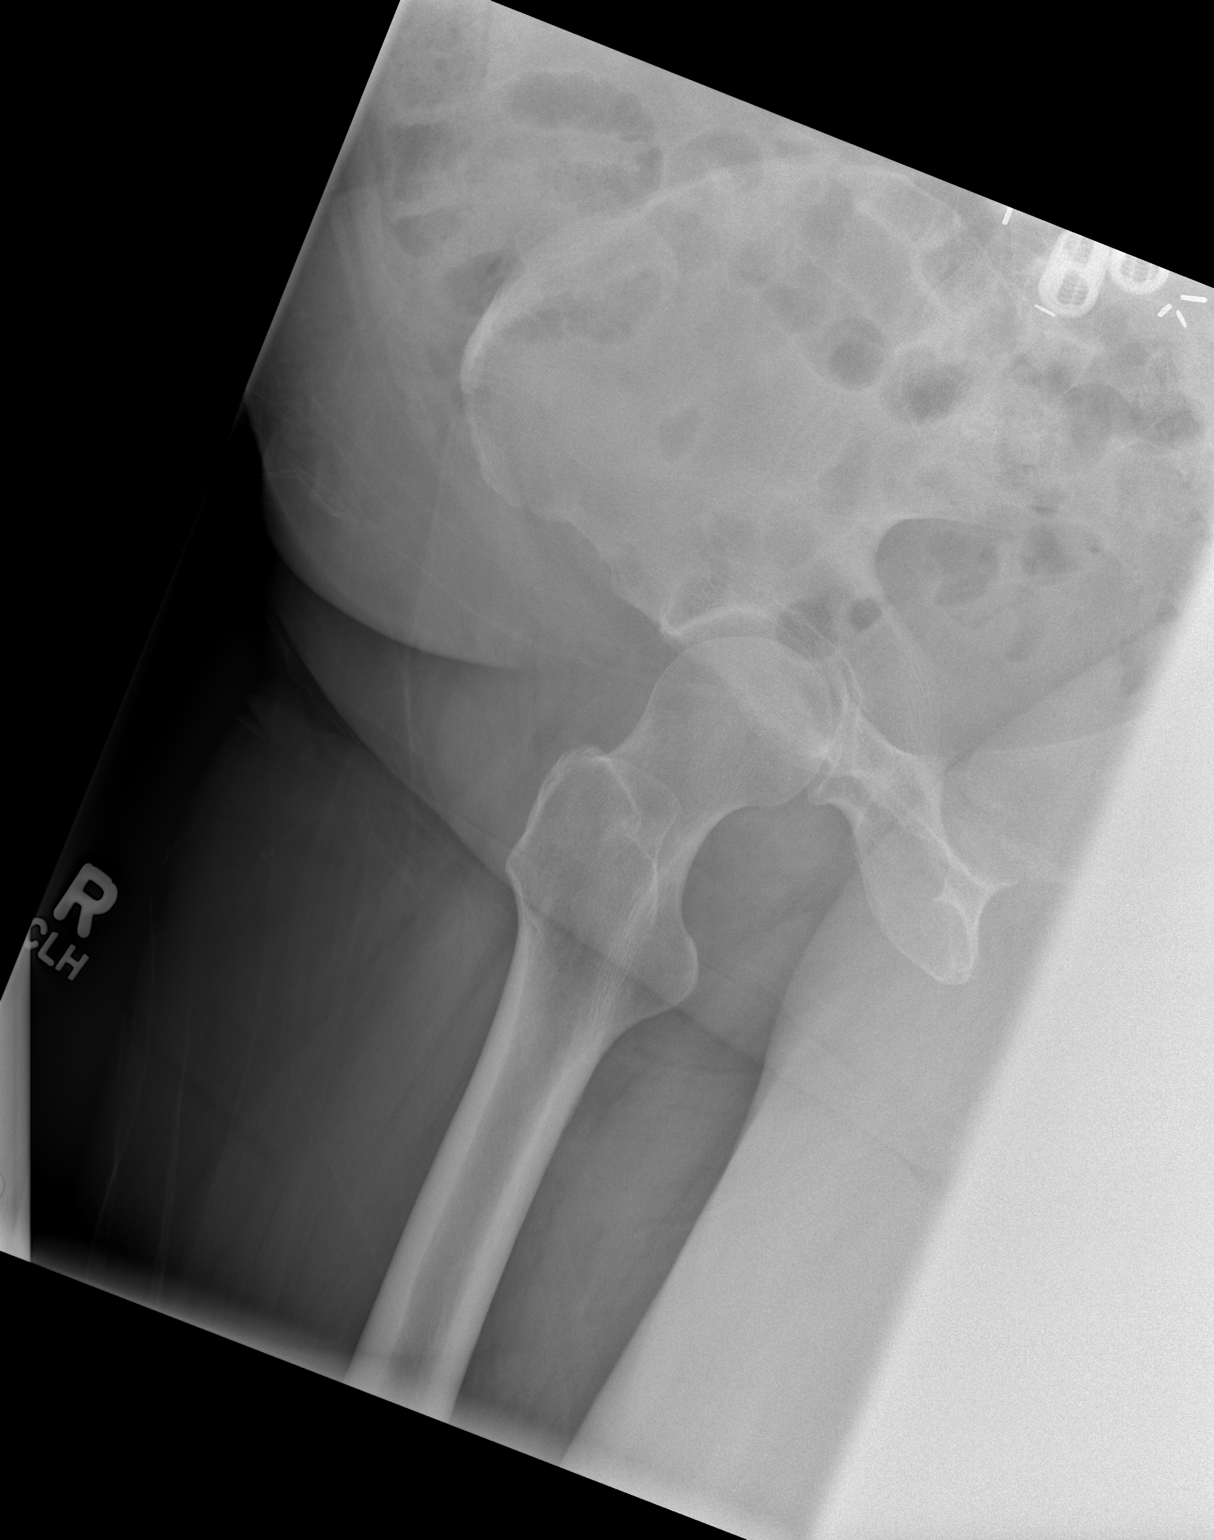

[3 of 3 positions shown; findings below may reference images not displayed]

FINDINGS: Three views of the right hip submitted. No acute fracture or
subluxation. Postsurgical changes are noted lower lumbar spine. Mild
degenerative changes bilateral SI joints. Mild degenerative changes
pubic symphysis.
IMPRESSION: No acute fracture or subluxation. Postsurgical changes lower lumbar
spine.

## 2014-08-10 IMAGING — CR DG CHEST 2V
2 series · 2 of 2 positions shown · non-contrast
Comparison: Chest radiograph performed 07/09/2013, and CT of the
chest performed 07/14/2013

CLINICAL DATA: Preoperative chest radiograph for lung surgery.
Cough. History of smoking. Known right hilar mass.

EXAM:
CHEST  2 VIEW

[w chest pa]
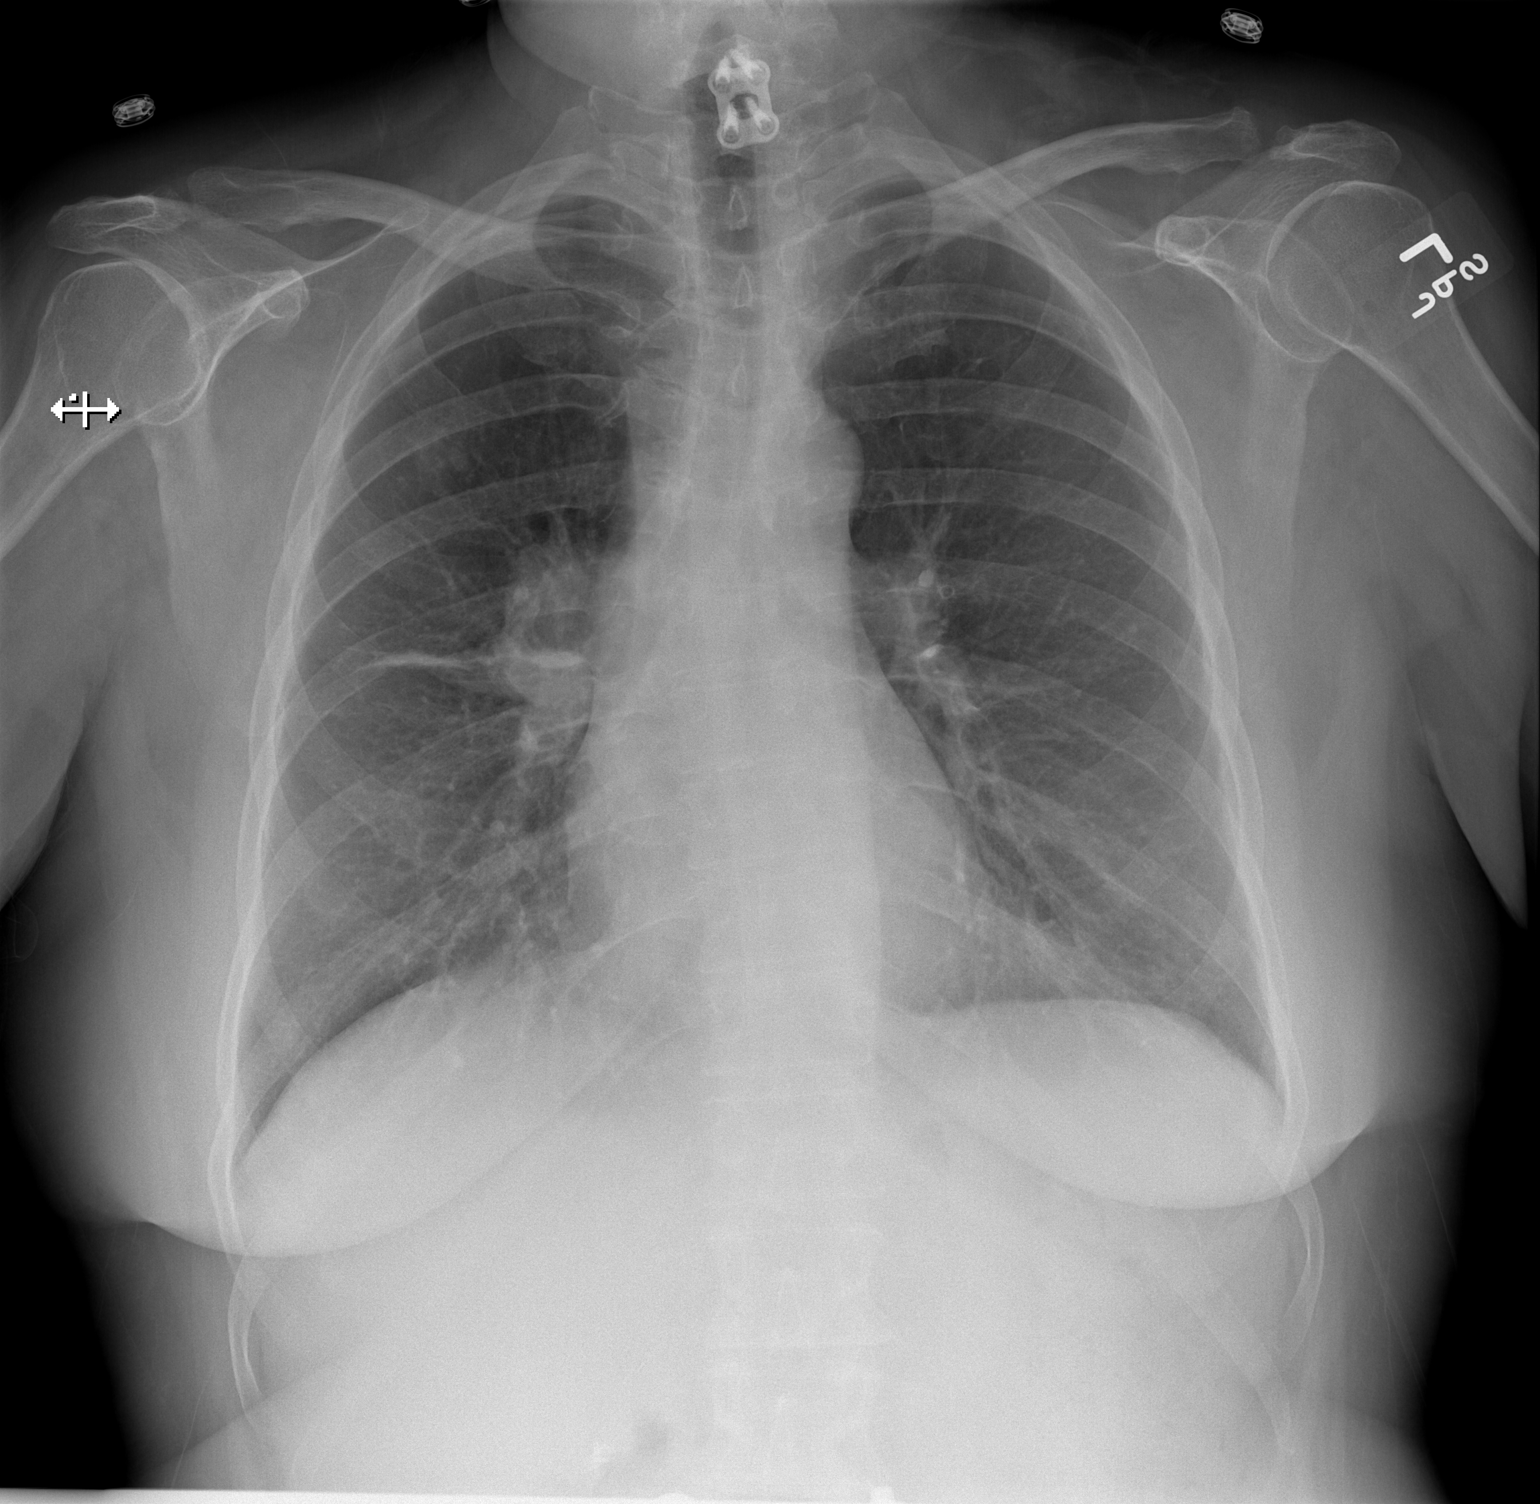

[w chest lat]
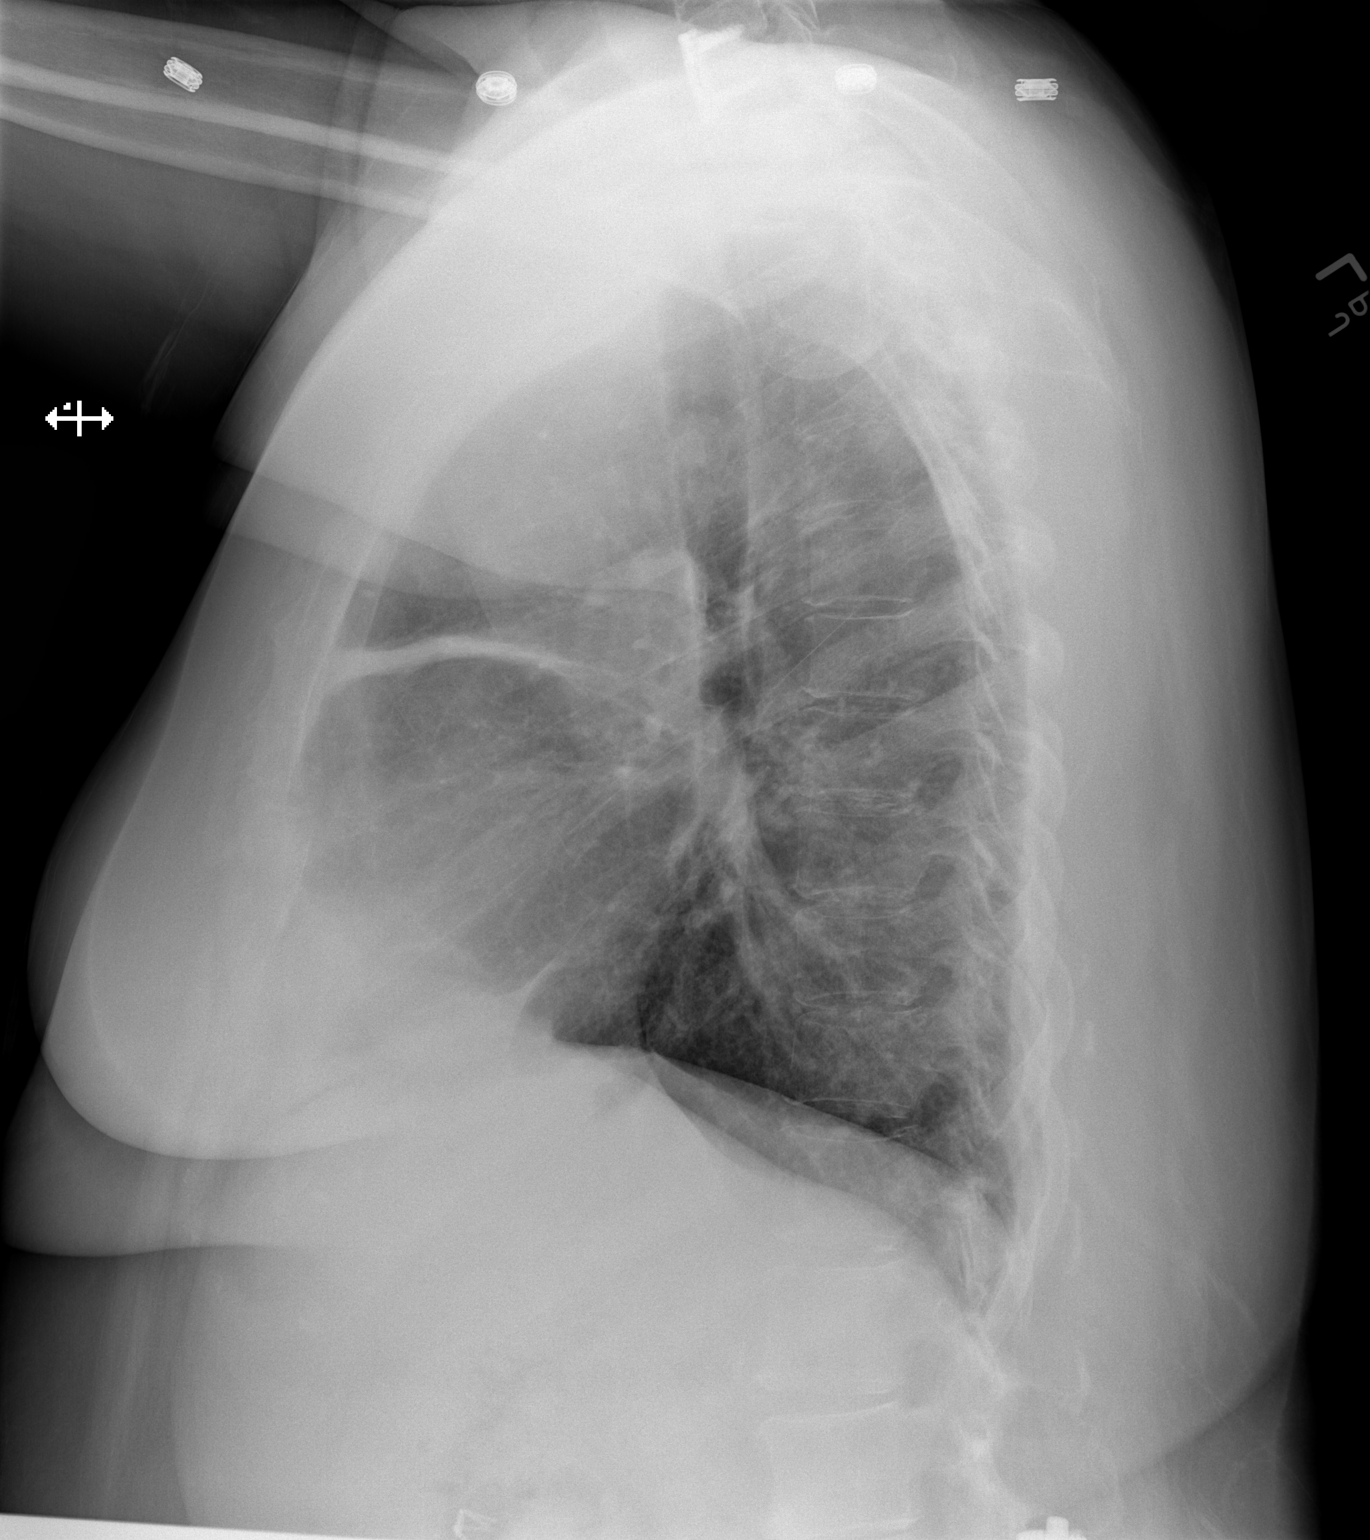

[2 of 2 positions shown; findings below may reference images not displayed]

FINDINGS: The patient's right hilar mass is again noted. A more peripheral
pulmonary nodule is again seen within the right upper lobe. The
lungs are otherwise grossly clear. No pleural effusion or
pneumothorax is seen.

The heart is normal in size. No acute osseous abnormalities are
identified. Cervical spinal fusion hardware is noted. Clips are
noted within the right upper quadrant, reflecting prior
cholecystectomy.
IMPRESSION: Right hilar mass again noted. More peripheral pulmonary nodule again
seen within the right upper lobe. Lungs otherwise clear.

## 2014-10-02 IMAGING — DX DG CHEST 1V PORT
1 series · 1 of 1 positions shown · non-contrast
Comparison: 08/20/2013

CLINICAL DATA: Cough and weakness

EXAM:
PORTABLE CHEST - 1 VIEW

[chest ap]
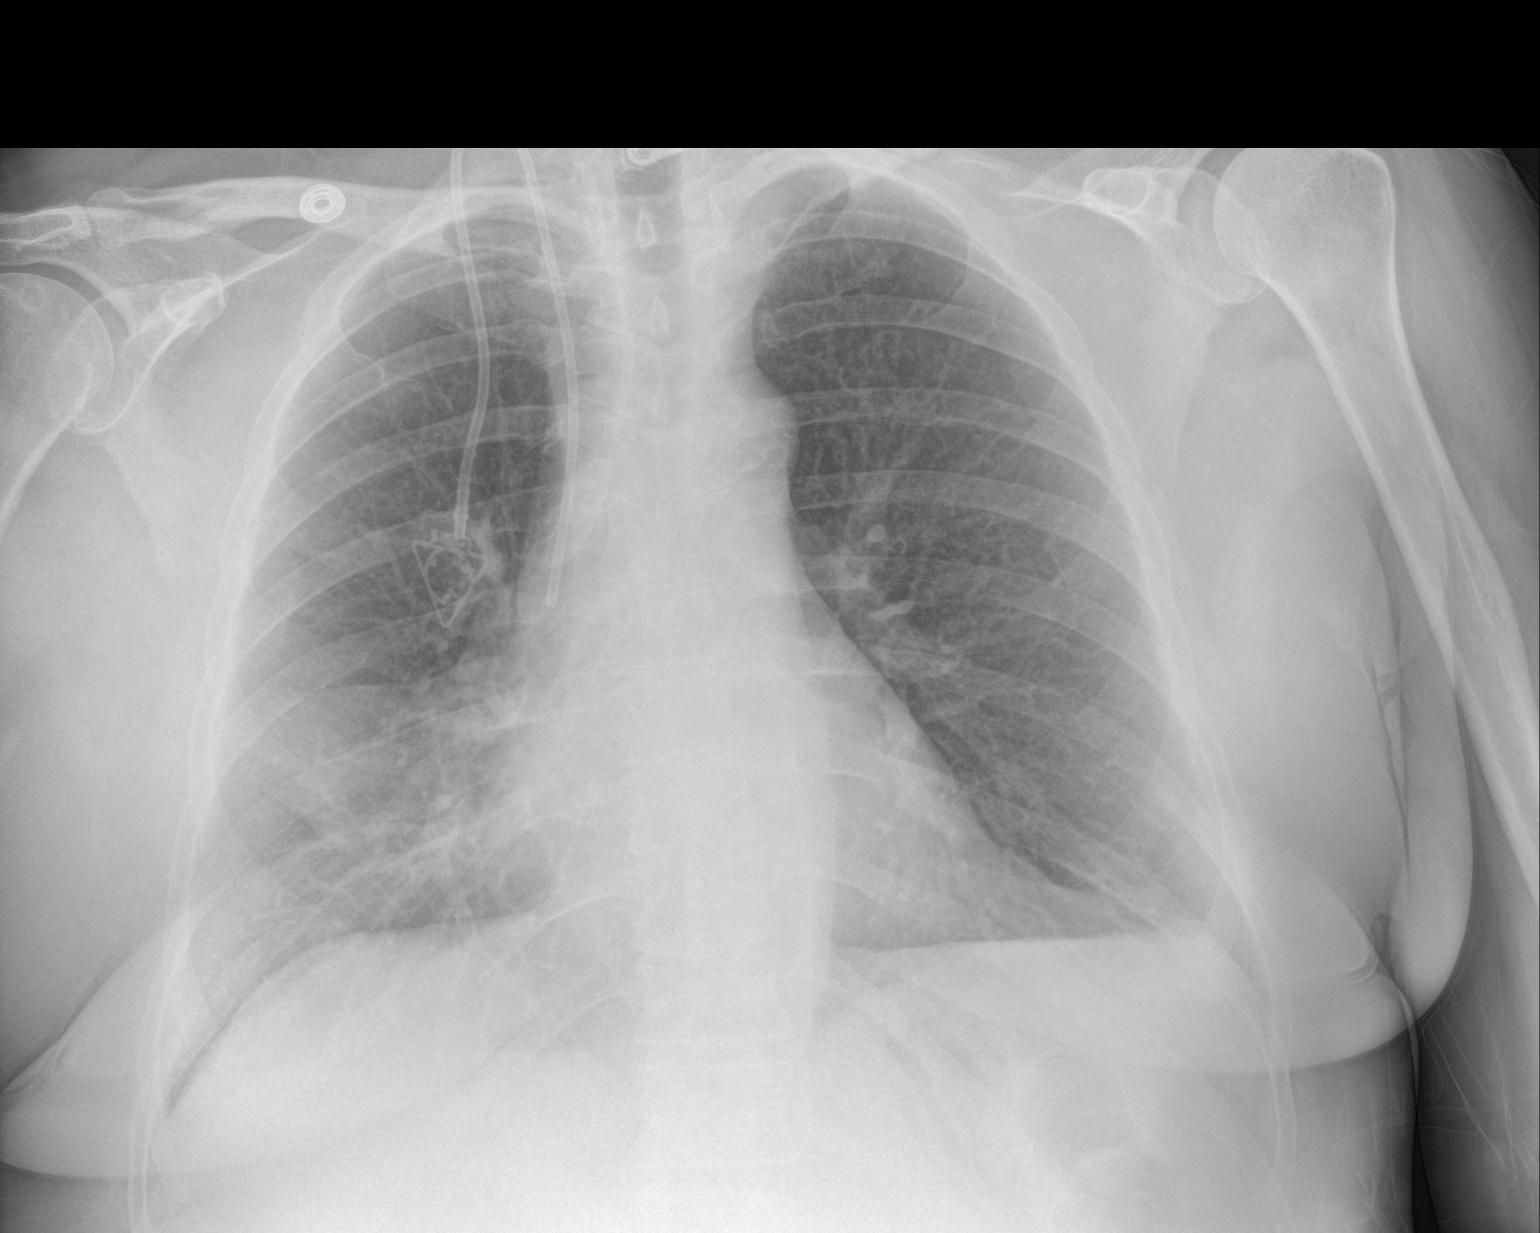

[1 of 1 positions shown; findings below may reference images not displayed]

FINDINGS: Right IJ vein Port-A-Cath has been placed with its tip at the
cavoatrial junction. Normal heart size. Left basilar subsegmental
atelectasis. Central right lung mass. No pneumothorax.
IMPRESSION: Stable right lung mass.

Right internal jugular vein Port-A-Cath placed with its tip at the
cavoatrial junction.

## 2015-03-18 ENCOUNTER — Other Ambulatory Visit: Payer: Self-pay | Admitting: Nurse Practitioner

## 2022-11-21 NOTE — Telephone Encounter (Signed)
Telephone call
